# Patient Record
Sex: Female | Born: 1950
Health system: Southern US, Community
[De-identification: ages and names within clinical notes are randomized; demographics above are authoritative.]

## PROBLEM LIST (undated history)

## (undated) DIAGNOSIS — I10 Essential (primary) hypertension: Secondary | ICD-10-CM

## (undated) DIAGNOSIS — E079 Disorder of thyroid, unspecified: Secondary | ICD-10-CM

## (undated) HISTORY — PX: CARDIAC SURGERY: SHX584

---

## 2016-10-21 DIAGNOSIS — Z951 Presence of aortocoronary bypass graft: Secondary | ICD-10-CM

## 2018-11-27 DIAGNOSIS — I25810 Atherosclerosis of coronary artery bypass graft(s) without angina pectoris: Secondary | ICD-10-CM | POA: Insufficient documentation

## 2019-11-23 ENCOUNTER — Other Ambulatory Visit: Payer: Self-pay

## 2019-11-23 ENCOUNTER — Encounter: Payer: Self-pay | Admitting: Emergency Medicine

## 2019-11-23 ENCOUNTER — Emergency Department
Admission: EM | Admit: 2019-11-23 | Discharge: 2019-11-23 | Disposition: A | Discharge status: Home / Self Care | Payer: Medicare HMO | Attending: Student | Admitting: Student

## 2019-11-23 ENCOUNTER — Emergency Department: Payer: Medicare HMO

## 2019-11-23 DIAGNOSIS — I10 Essential (primary) hypertension: Secondary | ICD-10-CM | POA: Diagnosis not present

## 2019-11-23 DIAGNOSIS — W01198A Fall on same level from slipping, tripping and stumbling with subsequent striking against other object, initial encounter: Secondary | ICD-10-CM | POA: Insufficient documentation

## 2019-11-23 DIAGNOSIS — Y939 Activity, unspecified: Secondary | ICD-10-CM | POA: Insufficient documentation

## 2019-11-23 DIAGNOSIS — Z7982 Long term (current) use of aspirin: Secondary | ICD-10-CM | POA: Insufficient documentation

## 2019-11-23 DIAGNOSIS — Z87891 Personal history of nicotine dependence: Secondary | ICD-10-CM | POA: Diagnosis not present

## 2019-11-23 DIAGNOSIS — Z79899 Other long term (current) drug therapy: Secondary | ICD-10-CM | POA: Diagnosis not present

## 2019-11-23 DIAGNOSIS — S22080A Wedge compression fracture of T11-T12 vertebra, initial encounter for closed fracture: Secondary | ICD-10-CM | POA: Diagnosis not present

## 2019-11-23 DIAGNOSIS — Y999 Unspecified external cause status: Secondary | ICD-10-CM | POA: Insufficient documentation

## 2019-11-23 DIAGNOSIS — Y92008 Other place in unspecified non-institutional (private) residence as the place of occurrence of the external cause: Secondary | ICD-10-CM | POA: Insufficient documentation

## 2019-11-23 DIAGNOSIS — S299XXA Unspecified injury of thorax, initial encounter: Secondary | ICD-10-CM | POA: Diagnosis present

## 2019-11-23 DIAGNOSIS — W19XXXA Unspecified fall, initial encounter: Secondary | ICD-10-CM

## 2019-11-23 HISTORY — DX: Essential (primary) hypertension: I10

## 2019-11-23 HISTORY — DX: Disorder of thyroid, unspecified: E07.9

## 2019-11-23 MED ORDER — OXYCODONE-ACETAMINOPHEN 7.5-325 MG PO TABS: 1.0000 | ORAL_TABLET | Freq: Four times a day (QID) | ORAL | 0 refills | Status: AC | PRN

## 2019-11-23 MED ORDER — OXYCODONE-ACETAMINOPHEN 7.5-325 MG PO TABS
1.0000 | ORAL_TABLET | Freq: Once | ORAL | Status: AC
  Administered 2019-11-23: 1 via ORAL
  Filled 2019-11-23: qty 1

## 2019-11-23 NOTE — ED Notes (Signed)
See triage note  Presents s/p fall  States she slipped on deck   Hit her back on rail  Having pain to mid to lower back

## 2019-11-23 NOTE — ED Provider Notes (Signed)
[facility] Emergency Department Provider Note  ____________________________________________   None    (approximate)  I have reviewed the triage vital signs and the nursing notes.   HISTORY  Chief Complaint Fall and Back Pain   HPI Desiree Madden is a 68 y.o. female presents to the ED via EMS after falling this morning on her deck.  Patient states that she landed initially on her buttocks and then hit her back on the rail.  She denies any head injury or loss of consciousness.  Patient currently complains of mid back pain.  She was able to get back in the house and was sitting on the bed when EMS arrived.  She denies any other symptoms or injuries.  Currently she rates her pain as 10/10.       Past Medical History:  Diagnosis Date  . Hypertension   . Thyroid disease     There are no problems to display for this patient.   Past Surgical History:  Procedure Laterality Date  . CARDIAC SURGERY      Prior to Admission medications   Medication Sig Start Date End Date Taking? Authorizing Provider  aspirin EC 81 MG tablet Take 81 mg by mouth daily.   Yes [provider]  atorvastatin (LIPITOR) 20 MG tablet Take 20 mg by mouth daily.   Yes [provider]  fluticasone (FLONASE) 50 MCG/ACT nasal spray Place 2 sprays into both nostrils daily.   Yes [provider]  levothyroxine (SYNTHROID) 88 MCG tablet Take 88 mcg by mouth daily before breakfast.   Yes [provider]  metoprolol tartrate (LOPRESSOR) 25 MG tablet Take 25 mg by mouth 2 (two) times daily.   Yes [provider]  oxyCODONE-acetaminophen (PERCOCET) 7.5-325 MG tablet Take 1 tablet by mouth every 6 (six) hours as needed for severe pain. 11/23/19 11/22/20  Johnn Hai, PA-C    Allergies Baclofen, Sertraline, Sulfa antibiotics, and Ibuprofen  No family history on file.  Social History Social History   Tobacco Use  . Smoking status: Former  Research scientist (life sciences)  . Smokeless tobacco: Never Used  Substance Use Topics  . Alcohol use: Not on file  . Drug use: Not on file    Review of Systems Constitutional: No fever/chills Eyes: No visual changes. ENT: No injury. Cardiovascular: Denies chest pain. Respiratory: Denies shortness of breath. Gastrointestinal: No abdominal pain.  No nausea, no vomiting. Musculoskeletal: Low back pain and bilateral buttocks pain. Skin: Negative for rash. Neurological: Negative for headaches, focal weakness or numbness. ____________________________________________   PHYSICAL EXAM:  VITAL SIGNS: ED Triage Vitals  Enc Vitals Group     BP 11/23/19 0644 (!) 158/81     Pulse Rate 11/23/19 0644 78     Resp 11/23/19 0644 18     Temp 11/23/19 0644 98.2 F (36.8 C)     Temp Source 11/23/19 0644 Oral     SpO2 11/23/19 0644 99 %     Weight 11/23/19 0644 180 lb (81.6 kg)     Height 11/23/19 0644 $RemoveBefor'5\' 4"'nPyBjgXskMZO$  (1.626 m)     Head Circumference --      Peak Flow --      Pain Score 11/23/19 0649 10     Pain Loc --      Pain Edu? --      Excl. in [place]? --    Constitutional: Alert and oriented. Well appearing and in no acute distress. Eyes: Conjunctivae are normal.  Head: Atraumatic. Neck: No stridor.  No cervical tenderness on palpation posteriorly. Cardiovascular: Normal rate, regular rhythm. Grossly normal heart sounds.  Good peripheral circulation. Respiratory: Normal respiratory effort.  No retractions. Lungs CTAB.  Nontender ribs to palpation. Gastrointestinal: Soft and nontender. No distention. Musculoskeletal: No gross deformities noted on examination of the back.  Moderate tenderness on palpation of the lumbar and sacral area.  No ecchymosis or abrasions are seen.  Range of motion is guarding secondary to pain. Neurologic:  Normal speech and language. No gross focal neurologic deficits are appreciated. No gait instability. Skin:  Skin is warm, dry and intact.  No ecchymosis or abrasions was noted. Psychiatric:  Mood and affect are normal. Speech and behavior are normal.  ____________________________________________   LABS (all labs ordered are listed, but only abnormal results are displayed)  Labs Reviewed - No data to display ____________________________________________ RADIOLOGY  Official radiology report(s): DG Lumbar Spine 2-3 Views  Result Date: 11/23/2019 CLINICAL DATA:  Back pain after fall EXAM: LUMBAR SPINE - 2-3 VIEW COMPARISON:  None. FINDINGS: Transitional lumbosacral anatomy with a partially sacralized L5 segment. Superior endplate compression deformity of the T11 vertebral body with approximately 25% vertebral body height loss. Remaining vertebral body heights are maintained. Intervertebral disc height loss at L4-5. Mild lower lumbar facet arthrosis. Advanced aortoiliac atherosclerotic calcifications. IMPRESSION: 1. Superior endplate compression deformity of the T11 vertebral body with approximately 25% vertebral body height loss. 2. Transitional lumbosacral anatomy with partially sacralized L5 segment. Electronically Signed   By: Davina Poke M.D.   On: 11/23/2019 08:04   CT THORACIC SPINE WO CONTRAST  Result Date: 11/23/2019 CLINICAL DATA:  T11 compression fracture secondary to a fall this morning. Severe back pain. EXAM: CT THORACIC SPINE WITHOUT CONTRAST TECHNIQUE: Multidetector CT images of the thoracic were obtained using the standard protocol without intravenous contrast. COMPARISON:  Radiographs dated 11/23/2019 FINDINGS: Alignment: Normal. Vertebrae: There is a mild compression fracture of the superior endplate of M41. The posterosuperior aspect of the T11 vertebral body extends 4 mm into the spinal canal with slight symmetric indentation upon the thecal sac. The fracture does not involve the pedicles or other posterior elements. Paraspinal and other soft tissues: Slight paraspinal soft tissue stranding at T10-11 consistent with minimal hemorrhage secondary to the T11  fracture. Aortic atherosclerosis. Disc levels: C7-T1 through T9-10: Normal. T10-11: No disc bulging or protrusion. Slight protrusion of the posterosuperior aspect of the T11 vertebral body into the spinal canal with symmetrical slight indentation upon the thecal sac but without focal neural impingement. T11-12: Normal. T12-L1: Normal. IMPRESSION: Mild compression fracture of the superior endplate of R83. The posterosuperior aspect of the T11 vertebral body extends 4 mm into the spinal canal with slight symmetric indentation upon the thecal sac but without focal neural impingement. Otherwise, normal thoracic spine. Electronically Signed   By: Lorriane Shire M.D.   On: 11/23/2019 09:25    ____________________________________________   PROCEDURES  Procedure(s) performed (including Critical Care):  Procedures   ____________________________________________   INITIAL IMPRESSION / ASSESSMENT AND PLAN / ED COURSE  As part of my medical decision making, I reviewed the following data within the electronic MEDICAL RECORD NUMBER Notes from prior ED visits and [place] Controlled Substance Database  68 year old female presents to the ED with complaint of back pain after sliding on her deck this morning hitting her back against the railing.  Patient denied any head injury or loss of consciousness.  Lumbar x-ray showed compression fracture T11 and CT skin verified 25% loss of height.  Patient was  made aware.  She is comfortable currently after having a Percocet 7.5 mg.  She is encouraged to use ice on her back as needed.  She will follow-up with orthopedics.  A prescription for Percocet was sent to her pharmacy.  She is aware that this medication could cause drowsiness as she has taken it in the past.  She is aware that she can follow-up with her PCP if any continued pain medication as needed.  ____________________________________________   FINAL CLINICAL IMPRESSION(S) / ED DIAGNOSES  Final diagnoses:  Compression  fracture of T11 vertebra, initial encounter ([place])  Fall in home, initial encounter     ED Discharge Orders         Ordered    oxyCODONE-acetaminophen (PERCOCET) 7.5-325 MG tablet  Every 6 hours PRN     11/23/19 0946           Note:  This document was prepared using Dragon voice recognition software and may include unintentional dictation errors.    Johnn Hai, PA-C 11/23/19 1509    Lilia Pro., MD 11/23/19 [phone]

## 2019-11-23 NOTE — ED Triage Notes (Addendum)
Pt arrived via [facility] with reports of fall this morning. PT states she slipped on icy deck, states she fell back into the wooden rail and hit the middle of her back and fell onto her tailbone.  Pt denies hitting her head, denies any neck pain.  Pt states pain is worse with movement.  Per EMS pt was sitting on the bed when they arrived at her house.  Per EMS pt had no palpable pain to back.

## 2019-11-23 NOTE — Discharge Instructions (Signed)
Follow-up with your primary care provider for any continued pain medication.  Dr. Roland Rack is the orthopedist on-call and you should make an appointment for follow-up concerning your compression fracture.  You may use ice or heat to your muscles as needed for discomfort.  Take pain medication only as directed every 6 hours.  Be aware that this medication could cause drowsiness and increase your risk for falling.  Return to the emergency department if any severe worsening of your symptoms.

## 2021-01-16 DIAGNOSIS — R300 Dysuria: Secondary | ICD-10-CM | POA: Diagnosis not present

## 2021-01-16 DIAGNOSIS — E039 Hypothyroidism, unspecified: Secondary | ICD-10-CM | POA: Diagnosis not present

## 2021-01-16 DIAGNOSIS — N321 Vesicointestinal fistula: Secondary | ICD-10-CM | POA: Diagnosis not present

## 2021-01-16 DIAGNOSIS — Z7689 Persons encountering health services in other specified circumstances: Secondary | ICD-10-CM | POA: Diagnosis not present

## 2021-01-16 DIAGNOSIS — Z780 Asymptomatic menopausal state: Secondary | ICD-10-CM | POA: Diagnosis not present

## 2021-01-16 DIAGNOSIS — E782 Mixed hyperlipidemia: Secondary | ICD-10-CM | POA: Diagnosis not present

## 2021-01-16 DIAGNOSIS — R7309 Other abnormal glucose: Secondary | ICD-10-CM | POA: Diagnosis not present

## 2021-01-16 DIAGNOSIS — I25810 Atherosclerosis of coronary artery bypass graft(s) without angina pectoris: Secondary | ICD-10-CM | POA: Diagnosis not present

## 2021-01-16 DIAGNOSIS — M8000XG Age-related osteoporosis with current pathological fracture, unspecified site, subsequent encounter for fracture with delayed healing: Secondary | ICD-10-CM | POA: Diagnosis not present

## 2021-01-16 DIAGNOSIS — I10 Essential (primary) hypertension: Secondary | ICD-10-CM | POA: Diagnosis not present

## 2021-02-07 DIAGNOSIS — I10 Essential (primary) hypertension: Secondary | ICD-10-CM | POA: Diagnosis not present

## 2021-02-07 DIAGNOSIS — E782 Mixed hyperlipidemia: Secondary | ICD-10-CM | POA: Diagnosis not present

## 2021-02-07 DIAGNOSIS — I25708 Atherosclerosis of coronary artery bypass graft(s), unspecified, with other forms of angina pectoris: Secondary | ICD-10-CM | POA: Diagnosis not present

## 2021-02-07 DIAGNOSIS — Z7689 Persons encountering health services in other specified circumstances: Secondary | ICD-10-CM | POA: Diagnosis not present

## 2021-02-21 ENCOUNTER — Encounter: Payer: Self-pay | Admitting: Radiology

## 2021-02-21 ENCOUNTER — Emergency Department: Payer: Medicare HMO

## 2021-02-21 ENCOUNTER — Inpatient Hospital Stay
Admission: EM | Admit: 2021-02-21 | Discharge: 2021-04-03 | DRG: 003 | Disposition: A | Discharge status: Rehabilitation Facility | Payer: Medicare HMO | Attending: Internal Medicine | Admitting: Internal Medicine

## 2021-02-21 ENCOUNTER — Other Ambulatory Visit: Payer: Self-pay

## 2021-02-21 DIAGNOSIS — Z20822 Contact with and (suspected) exposure to covid-19: Secondary | ICD-10-CM | POA: Diagnosis not present

## 2021-02-21 DIAGNOSIS — Z01818 Encounter for other preprocedural examination: Secondary | ICD-10-CM | POA: Diagnosis not present

## 2021-02-21 DIAGNOSIS — E860 Dehydration: Secondary | ICD-10-CM | POA: Diagnosis not present

## 2021-02-21 DIAGNOSIS — E785 Hyperlipidemia, unspecified: Secondary | ICD-10-CM | POA: Diagnosis present

## 2021-02-21 DIAGNOSIS — Z9889 Other specified postprocedural states: Secondary | ICD-10-CM

## 2021-02-21 DIAGNOSIS — K5641 Fecal impaction: Secondary | ICD-10-CM | POA: Diagnosis not present

## 2021-02-21 DIAGNOSIS — K089 Disorder of teeth and supporting structures, unspecified: Secondary | ICD-10-CM | POA: Diagnosis not present

## 2021-02-21 DIAGNOSIS — I214 Non-ST elevation (NSTEMI) myocardial infarction: Secondary | ICD-10-CM | POA: Diagnosis not present

## 2021-02-21 DIAGNOSIS — Z87891 Personal history of nicotine dependence: Secondary | ICD-10-CM

## 2021-02-21 DIAGNOSIS — Z809 Family history of malignant neoplasm, unspecified: Secondary | ICD-10-CM

## 2021-02-21 DIAGNOSIS — Z48816 Encounter for surgical aftercare following surgery on the genitourinary system: Secondary | ICD-10-CM | POA: Diagnosis not present

## 2021-02-21 DIAGNOSIS — R1312 Dysphagia, oropharyngeal phase: Secondary | ICD-10-CM | POA: Diagnosis not present

## 2021-02-21 DIAGNOSIS — T40605A Adverse effect of unspecified narcotics, initial encounter: Secondary | ICD-10-CM | POA: Diagnosis present

## 2021-02-21 DIAGNOSIS — K219 Gastro-esophageal reflux disease without esophagitis: Secondary | ICD-10-CM | POA: Diagnosis not present

## 2021-02-21 DIAGNOSIS — I255 Ischemic cardiomyopathy: Secondary | ICD-10-CM | POA: Diagnosis present

## 2021-02-21 DIAGNOSIS — I480 Paroxysmal atrial fibrillation: Secondary | ICD-10-CM | POA: Diagnosis not present

## 2021-02-21 DIAGNOSIS — R110 Nausea: Secondary | ICD-10-CM | POA: Diagnosis not present

## 2021-02-21 DIAGNOSIS — N1832 Chronic kidney disease, stage 3b: Secondary | ICD-10-CM | POA: Diagnosis present

## 2021-02-21 DIAGNOSIS — E876 Hypokalemia: Secondary | ICD-10-CM | POA: Diagnosis not present

## 2021-02-21 DIAGNOSIS — G928 Other toxic encephalopathy: Secondary | ICD-10-CM | POA: Diagnosis not present

## 2021-02-21 DIAGNOSIS — T17908A Unspecified foreign body in respiratory tract, part unspecified causing other injury, initial encounter: Secondary | ICD-10-CM

## 2021-02-21 DIAGNOSIS — I491 Atrial premature depolarization: Secondary | ICD-10-CM | POA: Diagnosis not present

## 2021-02-21 DIAGNOSIS — E870 Hyperosmolality and hypernatremia: Secondary | ICD-10-CM | POA: Diagnosis not present

## 2021-02-21 DIAGNOSIS — Z818 Family history of other mental and behavioral disorders: Secondary | ICD-10-CM

## 2021-02-21 DIAGNOSIS — R918 Other nonspecific abnormal finding of lung field: Secondary | ICD-10-CM | POA: Diagnosis not present

## 2021-02-21 DIAGNOSIS — K659 Peritonitis, unspecified: Secondary | ICD-10-CM | POA: Diagnosis not present

## 2021-02-21 DIAGNOSIS — D62 Acute posthemorrhagic anemia: Secondary | ICD-10-CM | POA: Diagnosis not present

## 2021-02-21 DIAGNOSIS — E872 Acidosis: Secondary | ICD-10-CM | POA: Diagnosis present

## 2021-02-21 DIAGNOSIS — Z430 Encounter for attention to tracheostomy: Secondary | ICD-10-CM | POA: Diagnosis not present

## 2021-02-21 DIAGNOSIS — R0902 Hypoxemia: Secondary | ICD-10-CM

## 2021-02-21 DIAGNOSIS — N390 Urinary tract infection, site not specified: Secondary | ICD-10-CM | POA: Diagnosis not present

## 2021-02-21 DIAGNOSIS — K651 Peritoneal abscess: Secondary | ICD-10-CM

## 2021-02-21 DIAGNOSIS — R1310 Dysphagia, unspecified: Secondary | ICD-10-CM | POA: Diagnosis not present

## 2021-02-21 DIAGNOSIS — K5780 Diverticulitis of intestine, part unspecified, with perforation and abscess without bleeding: Secondary | ICD-10-CM | POA: Diagnosis not present

## 2021-02-21 DIAGNOSIS — R578 Other shock: Secondary | ICD-10-CM | POA: Diagnosis present

## 2021-02-21 DIAGNOSIS — J9690 Respiratory failure, unspecified, unspecified whether with hypoxia or hypercapnia: Secondary | ICD-10-CM | POA: Diagnosis not present

## 2021-02-21 DIAGNOSIS — J811 Chronic pulmonary edema: Secondary | ICD-10-CM | POA: Diagnosis not present

## 2021-02-21 DIAGNOSIS — G7281 Critical illness myopathy: Secondary | ICD-10-CM | POA: Diagnosis not present

## 2021-02-21 DIAGNOSIS — N179 Acute kidney failure, unspecified: Secondary | ICD-10-CM | POA: Diagnosis present

## 2021-02-21 DIAGNOSIS — Z8249 Family history of ischemic heart disease and other diseases of the circulatory system: Secondary | ICD-10-CM

## 2021-02-21 DIAGNOSIS — N1831 Chronic kidney disease, stage 3a: Secondary | ICD-10-CM

## 2021-02-21 DIAGNOSIS — B9689 Other specified bacterial agents as the cause of diseases classified elsewhere: Secondary | ICD-10-CM | POA: Diagnosis not present

## 2021-02-21 DIAGNOSIS — Z951 Presence of aortocoronary bypass graft: Secondary | ICD-10-CM | POA: Diagnosis not present

## 2021-02-21 DIAGNOSIS — N183 Chronic kidney disease, stage 3 unspecified: Secondary | ICD-10-CM | POA: Diagnosis not present

## 2021-02-21 DIAGNOSIS — E039 Hypothyroidism, unspecified: Secondary | ICD-10-CM | POA: Diagnosis present

## 2021-02-21 DIAGNOSIS — B952 Enterococcus as the cause of diseases classified elsewhere: Secondary | ICD-10-CM | POA: Diagnosis not present

## 2021-02-21 DIAGNOSIS — M6289 Other specified disorders of muscle: Secondary | ICD-10-CM | POA: Diagnosis present

## 2021-02-21 DIAGNOSIS — D649 Anemia, unspecified: Secondary | ICD-10-CM | POA: Diagnosis not present

## 2021-02-21 DIAGNOSIS — I5043 Acute on chronic combined systolic (congestive) and diastolic (congestive) heart failure: Secondary | ICD-10-CM | POA: Diagnosis not present

## 2021-02-21 DIAGNOSIS — R109 Unspecified abdominal pain: Secondary | ICD-10-CM

## 2021-02-21 DIAGNOSIS — J9621 Acute and chronic respiratory failure with hypoxia: Secondary | ICD-10-CM | POA: Diagnosis not present

## 2021-02-21 DIAGNOSIS — K9403 Colostomy malfunction: Secondary | ICD-10-CM | POA: Diagnosis not present

## 2021-02-21 DIAGNOSIS — Z8261 Family history of arthritis: Secondary | ICD-10-CM

## 2021-02-21 DIAGNOSIS — I472 Ventricular tachycardia: Secondary | ICD-10-CM | POA: Diagnosis not present

## 2021-02-21 DIAGNOSIS — R509 Fever, unspecified: Secondary | ICD-10-CM | POA: Diagnosis not present

## 2021-02-21 DIAGNOSIS — K5732 Diverticulitis of large intestine without perforation or abscess without bleeding: Secondary | ICD-10-CM | POA: Diagnosis not present

## 2021-02-21 DIAGNOSIS — I471 Supraventricular tachycardia: Secondary | ICD-10-CM | POA: Diagnosis not present

## 2021-02-21 DIAGNOSIS — I10 Essential (primary) hypertension: Secondary | ICD-10-CM | POA: Diagnosis present

## 2021-02-21 DIAGNOSIS — R1111 Vomiting without nausea: Secondary | ICD-10-CM | POA: Diagnosis not present

## 2021-02-21 DIAGNOSIS — J9811 Atelectasis: Secondary | ICD-10-CM | POA: Diagnosis not present

## 2021-02-21 DIAGNOSIS — Z452 Encounter for adjustment and management of vascular access device: Secondary | ICD-10-CM | POA: Diagnosis not present

## 2021-02-21 DIAGNOSIS — R1084 Generalized abdominal pain: Secondary | ICD-10-CM | POA: Diagnosis not present

## 2021-02-21 DIAGNOSIS — K9409 Other complications of colostomy: Secondary | ICD-10-CM | POA: Diagnosis not present

## 2021-02-21 DIAGNOSIS — N736 Female pelvic peritoneal adhesions (postinfective): Secondary | ICD-10-CM | POA: Diagnosis present

## 2021-02-21 DIAGNOSIS — I129 Hypertensive chronic kidney disease with stage 1 through stage 4 chronic kidney disease, or unspecified chronic kidney disease: Secondary | ICD-10-CM | POA: Diagnosis not present

## 2021-02-21 DIAGNOSIS — K56609 Unspecified intestinal obstruction, unspecified as to partial versus complete obstruction: Secondary | ICD-10-CM

## 2021-02-21 DIAGNOSIS — K828 Other specified diseases of gallbladder: Secondary | ICD-10-CM | POA: Diagnosis not present

## 2021-02-21 DIAGNOSIS — R1032 Left lower quadrant pain: Secondary | ICD-10-CM | POA: Diagnosis not present

## 2021-02-21 DIAGNOSIS — I5021 Acute systolic (congestive) heart failure: Secondary | ICD-10-CM | POA: Diagnosis not present

## 2021-02-21 DIAGNOSIS — K55049 Acute infarction of large intestine, extent unspecified: Secondary | ICD-10-CM | POA: Diagnosis present

## 2021-02-21 DIAGNOSIS — F411 Generalized anxiety disorder: Secondary | ICD-10-CM | POA: Diagnosis not present

## 2021-02-21 DIAGNOSIS — I2510 Atherosclerotic heart disease of native coronary artery without angina pectoris: Secondary | ICD-10-CM | POA: Diagnosis present

## 2021-02-21 DIAGNOSIS — R9431 Abnormal electrocardiogram [ECG] [EKG]: Secondary | ICD-10-CM | POA: Diagnosis not present

## 2021-02-21 DIAGNOSIS — K55041 Focal (segmental) acute infarction of large intestine: Secondary | ICD-10-CM | POA: Diagnosis not present

## 2021-02-21 DIAGNOSIS — Z978 Presence of other specified devices: Secondary | ICD-10-CM | POA: Diagnosis not present

## 2021-02-21 DIAGNOSIS — K6389 Other specified diseases of intestine: Secondary | ICD-10-CM | POA: Diagnosis not present

## 2021-02-21 DIAGNOSIS — N322 Vesical fistula, not elsewhere classified: Secondary | ICD-10-CM | POA: Diagnosis not present

## 2021-02-21 DIAGNOSIS — Z4659 Encounter for fitting and adjustment of other gastrointestinal appliance and device: Secondary | ICD-10-CM | POA: Diagnosis not present

## 2021-02-21 DIAGNOSIS — B967 Clostridium perfringens [C. perfringens] as the cause of diseases classified elsewhere: Secondary | ICD-10-CM | POA: Diagnosis not present

## 2021-02-21 DIAGNOSIS — I96 Gangrene, not elsewhere classified: Secondary | ICD-10-CM | POA: Diagnosis present

## 2021-02-21 DIAGNOSIS — R6521 Severe sepsis with septic shock: Secondary | ICD-10-CM | POA: Diagnosis not present

## 2021-02-21 DIAGNOSIS — Z9049 Acquired absence of other specified parts of digestive tract: Secondary | ICD-10-CM

## 2021-02-21 DIAGNOSIS — K0889 Other specified disorders of teeth and supporting structures: Secondary | ICD-10-CM | POA: Diagnosis not present

## 2021-02-21 DIAGNOSIS — J9601 Acute respiratory failure with hypoxia: Secondary | ICD-10-CM | POA: Diagnosis not present

## 2021-02-21 DIAGNOSIS — K5903 Drug induced constipation: Secondary | ICD-10-CM | POA: Diagnosis present

## 2021-02-21 DIAGNOSIS — Z79890 Hormone replacement therapy (postmenopausal): Secondary | ICD-10-CM

## 2021-02-21 DIAGNOSIS — Z87440 Personal history of urinary (tract) infections: Secondary | ICD-10-CM

## 2021-02-21 DIAGNOSIS — K51913 Ulcerative colitis, unspecified with fistula: Secondary | ICD-10-CM | POA: Diagnosis not present

## 2021-02-21 DIAGNOSIS — N1330 Unspecified hydronephrosis: Secondary | ICD-10-CM | POA: Diagnosis not present

## 2021-02-21 DIAGNOSIS — N189 Chronic kidney disease, unspecified: Secondary | ICD-10-CM | POA: Diagnosis not present

## 2021-02-21 DIAGNOSIS — K5730 Diverticulosis of large intestine without perforation or abscess without bleeding: Secondary | ICD-10-CM | POA: Diagnosis not present

## 2021-02-21 DIAGNOSIS — J9600 Acute respiratory failure, unspecified whether with hypoxia or hypercapnia: Secondary | ICD-10-CM

## 2021-02-21 DIAGNOSIS — S22080A Wedge compression fracture of T11-T12 vertebra, initial encounter for closed fracture: Secondary | ICD-10-CM | POA: Diagnosis not present

## 2021-02-21 DIAGNOSIS — Z930 Tracheostomy status: Secondary | ICD-10-CM | POA: Diagnosis not present

## 2021-02-21 DIAGNOSIS — K9420 Gastrostomy complication, unspecified: Secondary | ICD-10-CM

## 2021-02-21 DIAGNOSIS — D72829 Elevated white blood cell count, unspecified: Secondary | ICD-10-CM | POA: Diagnosis not present

## 2021-02-21 DIAGNOSIS — K632 Fistula of intestine: Secondary | ICD-10-CM | POA: Diagnosis not present

## 2021-02-21 DIAGNOSIS — K5190 Ulcerative colitis, unspecified, without complications: Secondary | ICD-10-CM | POA: Diagnosis not present

## 2021-02-21 DIAGNOSIS — Z66 Do not resuscitate: Secondary | ICD-10-CM | POA: Diagnosis not present

## 2021-02-21 DIAGNOSIS — J449 Chronic obstructive pulmonary disease, unspecified: Secondary | ICD-10-CM | POA: Diagnosis not present

## 2021-02-21 DIAGNOSIS — F32A Depression, unspecified: Secondary | ICD-10-CM | POA: Diagnosis present

## 2021-02-21 DIAGNOSIS — I228 Subsequent ST elevation (STEMI) myocardial infarction of other sites: Secondary | ICD-10-CM | POA: Diagnosis not present

## 2021-02-21 DIAGNOSIS — R339 Retention of urine, unspecified: Secondary | ICD-10-CM | POA: Diagnosis not present

## 2021-02-21 DIAGNOSIS — K5792 Diverticulitis of intestine, part unspecified, without perforation or abscess without bleeding: Secondary | ICD-10-CM | POA: Diagnosis not present

## 2021-02-21 DIAGNOSIS — R7989 Other specified abnormal findings of blood chemistry: Secondary | ICD-10-CM | POA: Diagnosis present

## 2021-02-21 DIAGNOSIS — K5720 Diverticulitis of large intestine with perforation and abscess without bleeding: Secondary | ICD-10-CM | POA: Diagnosis not present

## 2021-02-21 DIAGNOSIS — I509 Heart failure, unspecified: Secondary | ICD-10-CM | POA: Diagnosis not present

## 2021-02-21 DIAGNOSIS — Z4682 Encounter for fitting and adjustment of non-vascular catheter: Secondary | ICD-10-CM | POA: Diagnosis not present

## 2021-02-21 DIAGNOSIS — R935 Abnormal findings on diagnostic imaging of other abdominal regions, including retroperitoneum: Secondary | ICD-10-CM | POA: Diagnosis not present

## 2021-02-21 DIAGNOSIS — R102 Pelvic and perineal pain: Secondary | ICD-10-CM | POA: Diagnosis not present

## 2021-02-21 DIAGNOSIS — N321 Vesicointestinal fistula: Secondary | ICD-10-CM | POA: Diagnosis not present

## 2021-02-21 DIAGNOSIS — K5900 Constipation, unspecified: Secondary | ICD-10-CM | POA: Diagnosis not present

## 2021-02-21 DIAGNOSIS — F410 Panic disorder [episodic paroxysmal anxiety] without agoraphobia: Secondary | ICD-10-CM | POA: Diagnosis present

## 2021-02-21 DIAGNOSIS — E43 Unspecified severe protein-calorie malnutrition: Secondary | ICD-10-CM | POA: Diagnosis not present

## 2021-02-21 DIAGNOSIS — I130 Hypertensive heart and chronic kidney disease with heart failure and stage 1 through stage 4 chronic kidney disease, or unspecified chronic kidney disease: Secondary | ICD-10-CM | POA: Diagnosis present

## 2021-02-21 DIAGNOSIS — R5381 Other malaise: Secondary | ICD-10-CM | POA: Diagnosis not present

## 2021-02-21 DIAGNOSIS — Z8349 Family history of other endocrine, nutritional and metabolic diseases: Secondary | ICD-10-CM

## 2021-02-21 DIAGNOSIS — R1010 Upper abdominal pain, unspecified: Secondary | ICD-10-CM | POA: Diagnosis not present

## 2021-02-21 DIAGNOSIS — Z0189 Encounter for other specified special examinations: Secondary | ICD-10-CM

## 2021-02-21 DIAGNOSIS — K658 Other peritonitis: Secondary | ICD-10-CM | POA: Diagnosis present

## 2021-02-21 DIAGNOSIS — R1030 Lower abdominal pain, unspecified: Secondary | ICD-10-CM | POA: Diagnosis not present

## 2021-02-21 DIAGNOSIS — I5042 Chronic combined systolic (congestive) and diastolic (congestive) heart failure: Secondary | ICD-10-CM | POA: Diagnosis not present

## 2021-02-21 DIAGNOSIS — A419 Sepsis, unspecified organism: Secondary | ICD-10-CM | POA: Diagnosis not present

## 2021-02-21 DIAGNOSIS — Z833 Family history of diabetes mellitus: Secondary | ICD-10-CM

## 2021-02-21 DIAGNOSIS — L89329 Pressure ulcer of left buttock, unspecified stage: Secondary | ICD-10-CM | POA: Diagnosis not present

## 2021-02-21 DIAGNOSIS — M21372 Foot drop, left foot: Secondary | ICD-10-CM | POA: Diagnosis not present

## 2021-02-21 DIAGNOSIS — Z79899 Other long term (current) drug therapy: Secondary | ICD-10-CM

## 2021-02-21 DIAGNOSIS — Z7982 Long term (current) use of aspirin: Secondary | ICD-10-CM

## 2021-02-21 DIAGNOSIS — J90 Pleural effusion, not elsewhere classified: Secondary | ICD-10-CM | POA: Diagnosis not present

## 2021-02-21 DIAGNOSIS — L8991 Pressure ulcer of unspecified site, stage 1: Secondary | ICD-10-CM | POA: Diagnosis not present

## 2021-02-21 LAB — URINALYSIS, COMPLETE (UACMP) WITH MICROSCOPIC
Bilirubin Urine: NEGATIVE
Glucose, UA: NEGATIVE mg/dL
Ketones, ur: NEGATIVE mg/dL
Nitrite: POSITIVE — AB
Protein, ur: 100 mg/dL — AB
Specific Gravity, Urine: 1.02 (ref 1.005–1.030)
pH: 7 (ref 5.0–8.0)

## 2021-02-21 LAB — CBC
HCT: 33.1 % — ABNORMAL LOW (ref 36.0–46.0)
Hemoglobin: 11 g/dL — ABNORMAL LOW (ref 12.0–15.0)
MCH: 29.6 pg (ref 26.0–34.0)
MCHC: 33.2 g/dL (ref 30.0–36.0)
MCV: 89.2 fL (ref 80.0–100.0)
Platelets: 267 10*3/uL (ref 150–400)
RBC: 3.71 MIL/uL — ABNORMAL LOW (ref 3.87–5.11)
RDW: 13.2 % (ref 11.5–15.5)
WBC: 17.9 10*3/uL — ABNORMAL HIGH (ref 4.0–10.5)
nRBC: 0 % (ref 0.0–0.2)

## 2021-02-21 LAB — LIPASE, BLOOD: Lipase: 54 U/L — ABNORMAL HIGH (ref 11–51)

## 2021-02-21 LAB — COMPREHENSIVE METABOLIC PANEL
ALT: 24 U/L (ref 0–44)
AST: 38 U/L (ref 15–41)
Albumin: 3.9 g/dL (ref 3.5–5.0)
Alkaline Phosphatase: 79 U/L (ref 38–126)
Anion gap: 10 (ref 5–15)
BUN: 34 mg/dL — ABNORMAL HIGH (ref 8–23)
CO2: 20 mmol/L — ABNORMAL LOW (ref 22–32)
Calcium: 9 mg/dL (ref 8.9–10.3)
Chloride: 106 mmol/L (ref 98–111)
Creatinine, Ser: 1.4 mg/dL — ABNORMAL HIGH (ref 0.44–1.00)
GFR, Estimated: 40 mL/min — ABNORMAL LOW (ref >60)
Glucose, Bld: 158 mg/dL — ABNORMAL HIGH (ref 70–99)
Potassium: 3.7 mmol/L (ref 3.5–5.1)
Sodium: 136 mmol/L (ref 135–145)
Total Bilirubin: 0.9 mg/dL (ref 0.3–1.2)
Total Protein: 8 g/dL (ref 6.5–8.1)

## 2021-02-21 LAB — LACTIC ACID, PLASMA
Lactic Acid, Venous: 3 mmol/L (ref 0.5–1.9)
Lactic Acid, Venous: 2.8 mmol/L (ref 0.5–1.9)

## 2021-02-21 LAB — RESP PANEL BY RT-PCR (FLU A&B, COVID) ARPGX2
Influenza A by PCR: NEGATIVE
Influenza B by PCR: NEGATIVE
SARS Coronavirus 2 by RT PCR: NEGATIVE

## 2021-02-21 LAB — TROPONIN I (HIGH SENSITIVITY): Troponin I (High Sensitivity): 17 ng/L (ref <18)

## 2021-02-21 MED ORDER — SODIUM CHLORIDE 0.9 % IV SOLN: INTRAVENOUS | Status: DC

## 2021-02-21 MED ORDER — ONDANSETRON HCL 4 MG/2ML IJ SOLN
4.0000 mg | Freq: Four times a day (QID) | INTRAMUSCULAR | Status: DC | PRN
Start: 2021-02-21 — End: 2021-03-02

## 2021-02-21 MED ORDER — HYDROMORPHONE HCL 1 MG/ML IJ SOLN
0.5000 mg | Freq: Once | INTRAMUSCULAR | Status: AC
  Administered 2021-02-21: 0.5 mg via INTRAVENOUS
  Filled 2021-02-21: qty 1

## 2021-02-21 MED ORDER — SODIUM CHLORIDE 0.9 % IV BOLUS
1000.0000 mL | Freq: Once | INTRAVENOUS | Status: AC
  Administered 2021-02-21: 1000 mL via INTRAVENOUS

## 2021-02-21 MED ORDER — FLUTICASONE PROPIONATE 50 MCG/ACT NA SUSP
2.0000 | Freq: Every day | NASAL | Status: DC
  Administered 2021-02-22 – 2021-03-01 (×7): 2 via NASAL
  Filled 2021-02-21: qty 16

## 2021-02-21 MED ORDER — CIPROFLOXACIN IN D5W 400 MG/200ML IV SOLN
400.0000 mg | Freq: Once | INTRAVENOUS | Status: AC
  Administered 2021-02-21: 400 mg via INTRAVENOUS
  Filled 2021-02-21: qty 200

## 2021-02-21 MED ORDER — METRONIDAZOLE IN NACL 5-0.79 MG/ML-% IV SOLN
500.0000 mg | Freq: Three times a day (TID) | INTRAVENOUS | Status: DC
  Administered 2021-02-22 – 2021-02-23 (×4): 500 mg via INTRAVENOUS
  Filled 2021-02-21 (×6): qty 100

## 2021-02-21 MED ORDER — ACETAMINOPHEN 325 MG PO TABS
650.0000 mg | ORAL_TABLET | Freq: Four times a day (QID) | ORAL | Status: DC | PRN
  Administered 2021-02-26 – 2021-02-27 (×2): 650 mg via ORAL
  Filled 2021-02-21 (×2): qty 2

## 2021-02-21 MED ORDER — FENTANYL CITRATE (PF) 100 MCG/2ML IJ SOLN
100.0000 ug | Freq: Once | INTRAMUSCULAR | Status: AC
  Administered 2021-02-21: 100 ug via INTRAVENOUS
  Filled 2021-02-21: qty 2

## 2021-02-21 MED ORDER — BISACODYL 10 MG RE SUPP
10.0000 mg | Freq: Once | RECTAL | Status: DC
  Filled 2021-02-21 (×2): qty 1

## 2021-02-21 MED ORDER — HYDROMORPHONE HCL 1 MG/ML IJ SOLN
0.5000 mg | INTRAMUSCULAR | Status: DC | PRN
  Administered 2021-02-22 (×3): 1 mg via INTRAVENOUS
  Filled 2021-02-21 (×3): qty 1

## 2021-02-21 MED ORDER — CIPROFLOXACIN IN D5W 400 MG/200ML IV SOLN
400.0000 mg | Freq: Two times a day (BID) | INTRAVENOUS | Status: DC
  Administered 2021-02-22 – 2021-02-23 (×3): 400 mg via INTRAVENOUS
  Filled 2021-02-21 (×4): qty 200

## 2021-02-21 MED ORDER — LEVOTHYROXINE SODIUM 88 MCG PO TABS
88.0000 ug | ORAL_TABLET | Freq: Every day | ORAL | Status: DC
  Administered 2021-02-23: 88 ug via ORAL
  Filled 2021-02-21 (×2): qty 1

## 2021-02-21 MED ORDER — ONDANSETRON HCL 4 MG/2ML IJ SOLN
4.0000 mg | Freq: Once | INTRAMUSCULAR | Status: AC
  Administered 2021-02-21: 4 mg via INTRAVENOUS
  Filled 2021-02-21: qty 2

## 2021-02-21 MED ORDER — ATORVASTATIN CALCIUM 20 MG PO TABS
20.0000 mg | ORAL_TABLET | Freq: Every day | ORAL | Status: DC
  Administered 2021-02-22 – 2021-02-28 (×7): 20 mg via ORAL
  Filled 2021-02-21 (×7): qty 1

## 2021-02-21 MED ORDER — HYDROCODONE-ACETAMINOPHEN 5-325 MG PO TABS
1.0000 | ORAL_TABLET | ORAL | Status: DC | PRN
Start: 2021-02-21 — End: 2021-02-25
  Administered 2021-02-21 – 2021-02-25 (×10): 2 via ORAL
  Filled 2021-02-21 (×10): qty 2

## 2021-02-21 MED ORDER — METRONIDAZOLE IN NACL 5-0.79 MG/ML-% IV SOLN
500.0000 mg | Freq: Once | INTRAVENOUS | Status: AC
  Administered 2021-02-21: 500 mg via INTRAVENOUS
  Filled 2021-02-21: qty 100

## 2021-02-21 MED ORDER — IOHEXOL 300 MG/ML  SOLN
75.0000 mL | Freq: Once | INTRAMUSCULAR | Status: AC | PRN
  Administered 2021-02-21: 75 mL via INTRAVENOUS

## 2021-02-21 MED ORDER — MORPHINE SULFATE (PF) 4 MG/ML IV SOLN
4.0000 mg | Freq: Once | INTRAVENOUS | Status: AC
  Administered 2021-02-21: 4 mg via INTRAVENOUS
  Filled 2021-02-21: qty 1

## 2021-02-21 MED ORDER — ONDANSETRON HCL 4 MG PO TABS: 4.0000 mg | ORAL_TABLET | Freq: Four times a day (QID) | ORAL | Status: DC | PRN

## 2021-02-21 MED ORDER — ACETAMINOPHEN 650 MG RE SUPP: 650.0000 mg | Freq: Four times a day (QID) | RECTAL | Status: DC | PRN

## 2021-02-21 NOTE — ED Notes (Signed)
Patient back from CT.

## 2021-02-21 NOTE — ED Notes (Signed)
Patient to CT at this time

## 2021-02-21 NOTE — ED Provider Notes (Signed)
[facility] Emergency Department Provider Note  Time seen: 4:21 PM  I have reviewed the triage vital signs and the nursing notes.   HISTORY  Chief Complaint Abdominal Pain   HPI Desiree Madden is a 70 y.o. female patient presents emergency department for lower abdominal pain.   Patient states since earlier today she has been experiencing moderate to severe lower abdominal pain.  Describes the pain as aching type pain.  States mild dysuria.  No known fever, no vomiting.  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  States she has seen debris in her urine.  Past Medical History:  Diagnosis Date  . Hypertension   . Thyroid disease     There are no problems to display for this patient.   Past Surgical History:  Procedure Laterality Date  . CARDIAC SURGERY      Prior to Admission medications   Medication Sig Start Date End Date Taking? Authorizing Provider  aspirin EC 81 MG tablet Take 81 mg by mouth daily.    [provider]  atorvastatin (LIPITOR) 20 MG tablet Take 20 mg by mouth daily.    [provider]  fluticasone (FLONASE) 50 MCG/ACT nasal spray Place 2 sprays into both nostrils daily.    [provider]  levothyroxine (SYNTHROID) 88 MCG tablet Take 88 mcg by mouth daily before breakfast.    [provider]  metoprolol tartrate (LOPRESSOR) 25 MG tablet Take 25 mg by mouth 2 (two) times daily.    [provider]    Allergies  Allergen Reactions  . Baclofen     Other reaction(s): Headache  . Sertraline     Other reaction(s): Other (See Comments), Other (See Comments) Muscle aches Muscle aches   . Sulfa Antibiotics     Other reaction(s): Other (See Comments), Other (See Comments) Pt was told not to take it due to colitis Pt was told not to take it due to colitis   . Ibuprofen Nausea And Vomiting    No family history on  file.  Social History Social History   Tobacco Use  . Smoking status: Former Research scientist (life sciences)  . Smokeless tobacco: Never Used  Vaping Use  . Vaping Use: Never used    Review of Systems Constitutional: Negative for fever. Cardiovascular: Negative for chest pain. Respiratory: Negative for shortness of breath. Gastrointestinal: Moderate to severe lower abdominal pain. Genitourinary: Mild dysuria.  Debris in her urine Musculoskeletal: Negative for musculoskeletal complaints Skin: Negative for skin complaints  Neurological: Negative for headache All other ROS negative  ____________________________________________   PHYSICAL EXAM:  VITAL SIGNS: ED Triage Vitals  Enc Vitals Group     BP 02/21/21 1535 (!) 128/101     Pulse Rate 02/21/21 1535 67     Resp 02/21/21 1535 18     Temp 02/21/21 1535 97.6 F (36.4 C)     Temp Source 02/21/21 1535 Oral     SpO2 02/21/21 1535 100 %     Weight 02/21/21 1540 125 lb (56.7 kg)     Height 02/21/21 1540 $RemoveBefor'5\' 4"'JiikNXmQYHRh$  (1.626 m)     Head Circumference --      Peak Flow --      Pain Score 02/21/21 1536 (P) 10     Pain Loc --      Pain Edu? --      Excl. in [place]? --     Constitutional: Alert and oriented. Well  appearing and in no distress. Eyes: Normal exam ENT      Head: Normocephalic and atraumatic.      Mouth/Throat: Mucous membranes are moist. Cardiovascular: Normal rate, regular rhythm.  Respiratory: Normal respiratory effort without tachypnea nor retractions. Breath sounds are clear Gastrointestinal: Soft, moderate lower abdominal tenderness palpation without rebound guarding or distention. Musculoskeletal: Nontender with normal range of motion in all extremities.  Neurologic:  Normal speech and language. No gross focal neurologic deficits  Skin:  Skin is warm, dry and intact.  Psychiatric: Mood and affect are normal.  ____________________________________________    EKG  EKG viewed and interpreted by myself shows a normal sinus rhythm at 62  bpm with a narrow QRS, normal axis, normal intervals.  Patient has inferolateral T wave inversions.  No old EKG for comparison.  Discussed this with the patient who states she has a known abnormal EKG and a planned echocardiogram coming up (unclear if this is the abnormality).  ____________________________________________    RADIOLOGY  IMPRESSION:  1. Advanced colonic diverticulosis with pericolonic fat stranding  about the proximal sigmoid colon, in the region of multiple colonic  diverticula, suspicious for acute diverticulitis. This fat stranding  extends to the adjacent urinary bladder.  2. Fluid level in the urinary bladder. This may be due to Foley  catheter/instrumentation, or fistula as provided history.  Pericolonic edema extends to the anterior aspect of the bladder.  3. Large volume of colonic stool from the splenic flexure distally,  suggesting constipation. There is wall thickening in the mid sigmoid  colon that is nonspecific. Recommend up-to-date colonoscopy to  exclude the possibility of colonic neoplasm.  4. Chronic but progressive moderately severe T11 compression  fracture. Increase vertebral body height loss since 11/23/2019  thoracic spine CT, particularly anteriorly.   ____________________________________________   INITIAL IMPRESSION / ASSESSMENT AND PLAN / ED COURSE  Pertinent labs & imaging results that were available during my care of the patient were reviewed by me and considered in my medical decision making (see chart for details).   Patient presents emergency department for lower abdominal pain since this morning.  Now moderate to severe in nature.  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  We will check labs, urinalysis, send urine culture.  We will obtain CT imaging of lower abdomen, treat pain nausea and IV hydrate.  Given the patient's abnormal EKG we will obtain a troponin as well.  Patient agreeable.  CT scan shows  diverticulitis with possible colovesicular fistula.  We will cover with antibiotics and also cover for urinary tract infection.  We will dose Cipro and Flagyl IV.  Given the patient's lower abdominal pain leukocytosis and CT findings we will admit to the hospital service for further work-up and treatment.  Ndea Kilroy was evaluated in Emergency Department on 02/21/2021 for the symptoms described in the history of present illness. She was evaluated in the context of the global COVID-19 pandemic, which necessitated consideration that the patient might be at risk for infection with the SARS-CoV-2 virus that causes COVID-19. Institutional protocols and algorithms that pertain to the evaluation of patients at risk for COVID-19 are in a state of rapid change based on information released by regulatory bodies including the CDC and federal and state organizations. These policies and algorithms were followed during the patient's care in the ED.  ____________________________________________   FINAL CLINICAL IMPRESSION(S) / ED DIAGNOSES  Abdominal pain Urinary tract infection Diverticulitis    Harvest Dark, MD 02/21/21  1822  

## 2021-02-21 NOTE — ED Triage Notes (Signed)
trigimeny pvc's 132mcg fentanyl and $RemoveBefo'4mg'jVtUfpukxaK$  zofran Started 3-4 hours ago. Pt reports lower abd pain throughout and states that she has a fistula in her bladder and a hx of diverticulitis Pt arrives via [facility]

## 2021-02-21 NOTE — H&P (Signed)
Desiree Madden CBU:[id number] DOB: 09-Feb-1951 DOA: 02/21/2021     PCP: [facility]   Outpatient Specialists:   CARDS:   Dr. Nehemiah Massed    Patient arrived to ER on 02/21/21 at 1529 Referred by Attending Harvest Dark, MD   Patient coming from: home      Chief Complaint: Chief Complaint  Patient presents with  . Abdominal Pain    HPI: Desiree Madden is a 70 y.o. female with medical history significant of colon-Bladder fistula and diverticulitis, hypertension hypothyroidism, HLD, ulcerative colitis    Presented with abdominal pain starting since today severe in the lower abdomen.  She has been having some dysuria no fevers no chills no vomiting.   patient reports she was supposed to have Operation to repair done in October but it got pushed back. She has appointment coming up on 1st of Feb.  Patient had prior episode of diverticulitis when she was in [place] reports felt very similar to current  Infectious risk factors:  Reports Diarrhea/abdominal pain,       Initial COVID TEST  NEGATIVE   Lab Results  Component Value Date   [place] NEGATIVE 02/21/2021     Regarding pertinent Chronic problems:   Hyperlipidemia -  on statins Lipitor   HTN on Lopressor      CAD sp CABG 2017 - On Aspirin, statin, betablocker                 -  followed by cardiology    Hypothyroidism: on synthroid     CKD stage III- baseline Cr   Estimated Creatinine Clearance: 32.3 mL/min (A) (by C-G formula based on SCr of 1.4 mg/dL (H)).  Lab Results  Component Value Date   CREATININE 1.40 (H) 02/21/2021   Ulcerative Colitis currently not on any therapy   While in ER: CT showing evidence of possible diverticulitis, constipation could not rule out colonic neoplasm Given a dose of Cipro Flagyl    ED Triage Vitals  Enc Vitals Group     BP 02/21/21 1535 (!) 128/101     Pulse Rate 02/21/21 1535 67     Resp 02/21/21 1535 18     Temp 02/21/21 1535 97.6 F (36.4 C)      Temp Source 02/21/21 1535 Oral     SpO2 02/21/21 1535 100 %     Weight 02/21/21 1540 125 lb (56.7 kg)     Height 02/21/21 1540 $RemoveBefor'5\' 4"'QOLIWLGIhKRw$  (1.626 m)     Head Circumference --      Peak Flow --      Pain Score 02/21/21 1536 (P) 10     Pain Loc --      Pain Edu? --      Excl. in [place]? --   TMAX(24)@     _________________________________________ Significant initial  Findings: Abnormal Labs Reviewed  LIPASE, BLOOD - Abnormal; Notable for the following components:      Result Value   Lipase 54 (*)    All other components within normal limits  COMPREHENSIVE METABOLIC PANEL - Abnormal; Notable for the following components:   CO2 20 (*)    Glucose, Bld 158 (*)    BUN 34 (*)    Creatinine, Ser 1.40 (*)    GFR, Estimated 40 (*)    All other components within normal limits  CBC - Abnormal; Notable for the following components:   WBC 17.9 (*)    RBC 3.71 (*)    Hemoglobin 11.0 (*)  HCT 33.1 (*)    All other components within normal limits  URINALYSIS, COMPLETE (UACMP) WITH MICROSCOPIC - Abnormal; Notable for the following components:   APPearance CLOUDY (*)    Hgb urine dipstick LARGE (*)    Protein, ur 100 (*)    Nitrite POSITIVE (*)    Leukocytes,Ua LARGE (*)    Bacteria, UA MANY (*)    All other components within normal limits   ____________________________________________ CTabd/pelvis - diverticultis  _________________________ Troponin 17 ECG: Ordered Personally reviewed by me showing: HR : 62 Rhythm: NSR,   nonspecific changes QTC 470   The recent clinical data is shown below. Vitals:   02/21/21 1600 02/21/21 1700 02/21/21 1800 02/21/21 1859  BP: (!) 157/73 (!) 156/70 (!) 148/75 (!) 164/88  Pulse: 66 65 67 66  Resp: $Remo'18 13 18 19  'oXwLP$ Temp:    98.9 F (37.2 C)  TempSrc:    Oral  SpO2: 99% 100% 99% 99%  Weight:      Height:          WBC     Component Value Date/Time   WBC 17.9 (H) 02/21/2021 1543      UA   evidence of UTI     Urine analysis:    Component  Value Date/Time   COLORURINE YELLOW 02/21/2021 1543   APPEARANCEUR CLOUDY (A) 02/21/2021 1543   LABSPEC 1.020 02/21/2021 1543   PHURINE 7.0 02/21/2021 1543   GLUCOSEU NEGATIVE 02/21/2021 1543   HGBUR LARGE (A) 02/21/2021 1543   BILIRUBINUR NEGATIVE 02/21/2021 1543   KETONESUR NEGATIVE 02/21/2021 1543   PROTEINUR 100 (A) 02/21/2021 1543   NITRITE POSITIVE (A) 02/21/2021 1543   LEUKOCYTESUR LARGE (A) 02/21/2021 1543      Lactic Acid, Venous    Component Value Date/Time   LATICACIDVEN 3.0 ([place]) 02/21/2021 1938     _____________ _______________________________________________ Hospitalist was called for admission for diverticulitis, Colono-vesicular fistula  The following Work up has been ordered so far:  Orders Placed This Encounter  Procedures  . Urine culture  . CT ABDOMEN PELVIS W CONTRAST  . Lipase, blood  . Comprehensive metabolic panel  . CBC  . Urinalysis, Complete w Microscopic  . Diet NPO time specified  . Consult to hospitalist  . ED EKG     Following Medications were ordered in ER: Medications  ciprofloxacin (CIPRO) IVPB 400 mg (400 mg Intravenous New Bag/Given 02/21/21 1829)  metroNIDAZOLE (FLAGYL) IVPB 500 mg (has no administration in time range)  morphine 4 MG/ML injection 4 mg (4 mg Intravenous Given 02/21/21 1614)  ondansetron (ZOFRAN) injection 4 mg (4 mg Intravenous Given 02/21/21 1614)  sodium chloride 0.9 % bolus 1,000 mL (1,000 mLs Intravenous New Bag/Given 02/21/21 1617)  iohexol (OMNIPAQUE) 300 MG/ML solution 75 mL (75 mLs Intravenous Contrast Given 02/21/21 1627)  fentaNYL (SUBLIMAZE) injection 100 mcg (100 mcg Intravenous Given 02/21/21 1713)        Consult Orders  (From admission, onward)         Start     Ordered   02/21/21 1900  Consult to hospitalist  Once       Provider:  (Not yet assigned)  Question Answer Comment  Place call to: [facility]   Reason for Consult Admit   Diagnosis/Clinical Info for Consult: UTI/diverticulitis       02/21/21 1900             OTHER Significant initial  Findings:  labs showing:    Recent Labs  Lab 02/21/21 1543  NA  136  K 3.7  CO2 20*  GLUCOSE 158*  BUN 34*  CREATININE 1.40*  CALCIUM 9.0    Cr   stable,  Up from baseline 1.6 Lab Results  Component Value Date   CREATININE 1.40 (H) 02/21/2021    Recent Labs  Lab 02/21/21 1543  AST 38  ALT 24  ALKPHOS 79  BILITOT 0.9  PROT 8.0  ALBUMIN 3.9   Lab Results  Component Value Date   CALCIUM 9.0 02/21/2021      Plt: Lab Results  Component Value Date   PLT 267 02/21/2021       Recent Labs  Lab 02/21/21 1543  WBC 17.9*  HGB 11.0*  HCT 33.1*  MCV 89.2  PLT 267    HG/HCT  stable,       Component Value Date/Time   HGB 11.0 (L) 02/21/2021 1543   HCT 33.1 (L) 02/21/2021 1543   MCV 89.2 02/21/2021 1543      Recent Labs  Lab 02/21/21 1543  LIPASE 54*   No results for input(s): AMMONIA in the last 168 hours.   Cardiac Panel (last 3 results) No results for input(s): CKTOTAL, CKMB, TROPONINI, RELINDX in the last 72 hours.   BNP (last 3 results) No results for input(s): BNP in the last 8760 hours.    DM  labs:  HbA1C: No results for input(s): HGBA1C in the last 8760 hours.     CBG (last 3)  No results for input(s): GLUCAP in the last 72 hours.        Cultures: No results found for: SDES, [place], CULT, REPTSTATUS   Radiological Exams on Admission: CT ABDOMEN PELVIS W CONTRAST  Result Date: 02/21/2021 CLINICAL DATA:  Lower abdominal pain. History of diverticulitis. 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 EXAM: CT ABDOMEN AND PELVIS WITH CONTRAST TECHNIQUE: Multidetector CT imaging of the abdomen and pelvis was performed using the standard protocol following bolus administration of intravenous contrast. CONTRAST:  43mL OMNIPAQUE IOHEXOL 300 MG/ML  SOLN COMPARISON:  None. FINDINGS: Lower chest: No basilar consolidation or pleural effusion. Minimal posterior  pleural thickening on the left. Coronary artery calcifications. Epicardial leads in place. Hepatobiliary: Minimal focal fatty infiltration adjacent to the gallbladder fossa. No suspicious hepatic lesion. No calcified gallstone or pericholecystic fat stranding. No biliary dilatation. Pancreas: No ductal dilatation or inflammation. Spleen: Normal in size without focal abnormality. Adrenals/Urinary Tract: Normal adrenal glands. Lobulated bilateral renal contours. Slight atrophy of left kidney. No hydronephrosis. No perinephric edema. Tiny cyst in the upper left kidney. No visualized renal stone. Symmetric excretion on delayed phase imaging. The urinary bladder is displaced into the left pelvis. There is a in bladder or fluid level. No perivesicular fat stranding. No definite bladder wall thickening. Loss of fat plane between the superior aspect of the urinary bladder and a sigmoid colonic diverticulum. Stomach/Bowel: Pericolonic fat stranding about the proximal sigmoid colon, series 2, image 62, in the region of multiple colonic diverticula suspicious for diverticulitis. This fat stranding extends to the adjacent urinary bladder. There are numerable colonic diverticula from the splenic flexure distally. Large volume of colonic stool from the splenic flexure distally. Mild sigmoid colonic wall thickening which is nonspecific. There are scattered right colonic diverticula. Transverse colon is tortuous. Small hiatal hernia. Stomach is decompressed. No small bowel obstruction or inflammation. Normal air-filled appendix. Vascular/Lymphatic: Moderately advanced aortic atherosclerosis. Irregular plaque in the upper abdominal aorta. Incidental 2 right and left renal arteries. No aneurysm. Calcified plaque throughout the left  common iliac artery causes some degree of stenosis. Patent portal vein. No portal or mesenteric venous gas. Multiple small retroperitoneal lymph nodes, typically reactive. No bulky abdominopelvic  adenopathy. Reproductive: Uterus remains in situ, atrophic and deviated to the left. No adnexal mass. Other: No free air or focal fluid collection. Minimal free fluid in the left pelvis. No abdominal wall hernia. Musculoskeletal: Chronic but progressive moderately severe T11 compression fracture. Increase vertebral body height loss since 11/23/2019 thoracic spine CT, particularly anteriorly. Hemi transitional lumbosacral anatomy with enlarged right transverse process and pseudoarticulation with the sacrum. Bones are subjectively under mineralized. IMPRESSION: 1. Advanced colonic diverticulosis with pericolonic fat stranding about the proximal sigmoid colon, in the region of multiple colonic diverticula, suspicious for acute diverticulitis. This fat stranding extends to the adjacent urinary bladder. 2. Fluid level in the urinary bladder. This may be due to Foley catheter/instrumentation, or fistula as provided history. Pericolonic edema extends to the anterior aspect of the bladder. 3. Large volume of colonic stool from the splenic flexure distally, suggesting constipation. There is wall thickening in the mid sigmoid colon that is nonspecific. Recommend up-to-date colonoscopy to exclude the possibility of colonic neoplasm. 4. Chronic but progressive moderately severe T11 compression fracture. Increase vertebral body height loss since 11/23/2019 thoracic spine CT, particularly anteriorly. Aortic Atherosclerosis (ICD10-I70.0). Electronically Signed   By: Keith Rake M.D.   On: 02/21/2021 16:54   _______________________________________________________________________________________________________ Latest  Blood pressure (!) 164/88, pulse 66, temperature 98.9 F (37.2 C), temperature source Oral, resp. rate 19, height $RemoveBe'5\' 4"'LXNPsWIHy$  (1.626 m), weight 56.7 kg, SpO2 99 %.   Review of Systems:    Pertinent positives include: chills abdominal pain,   Constitutional:  No weight loss, night sweats, Fevers, , fatigue,  weight loss  HEENT:  No headaches, Difficulty swallowing,Tooth/dental problems,Sore throat,  No sneezing, itching, ear ache, nasal congestion, post nasal drip,  Cardio-vascular:  No chest pain, Orthopnea, PND, anasarca, dizziness, palpitations.no Bilateral lower extremity swelling  GI:  No heartburn, indigestion, nausea, vomiting, diarrhea, change in bowel habits, loss of appetite, melena, blood in stool, hematemesis Resp:  no shortness of breath at rest. No dyspnea on exertion, No excess mucus, no productive cough, No non-productive cough, No coughing up of blood.No change in color of mucus.No wheezing. Skin:  no rash or lesions. No jaundice GU:  no dysuria, change in color of urine, no urgency or frequency. No straining to urinate.  No flank pain.  Musculoskeletal:  No joint pain or no joint swelling. No decreased range of motion. No back pain.  Psych:  No change in mood or affect. No depression or anxiety. No memory loss.  Neuro: no localizing neurological complaints, no tingling, no weakness, no double vision, no gait abnormality, no slurred speech, no confusion  All systems reviewed and apart from [place] all are negative _______________________________________________________________________________________________ Past Medical History:   Past Medical History:  Diagnosis Date  . Hypertension   . Thyroid disease       Past Surgical History:  Procedure Laterality Date  . CARDIAC SURGERY      Social History:  Ambulatory   independently       reports that she has quit smoking. She has never used smokeless tobacco. She reports that she does not drink alcohol and does not use drugs.     Family History:   Family History  Problem Relation Age of Onset  . Arthritis Other   . CAD Mother      Family History  Problem Relation Age of  Onset  . Anxiety Mother  . Coronary Artery Disease (Blocked arteries around heart) Mother  . Diabetes type II Mother  . Thyroid disease  Mother  . Heart disease Mother  . Cancer Father  renal  . Coronary Artery Disease (Blocked arteries around heart) Brother  . Osteoarthritis Brother    ______________________________________________________________________________________________ Allergies: Allergies  Allergen Reactions  . Baclofen     Other reaction(s): Headache  . Sertraline     Other reaction(s): Other (See Comments), Other (See Comments) Muscle aches Muscle aches   . Sulfa Antibiotics     Other reaction(s): Other (See Comments), Other (See Comments) Pt was told not to take it due to colitis Pt was told not to take it due to colitis   . Ibuprofen Nausea And Vomiting     Prior to Admission medications   Medication Sig Start Date End Date Taking? Authorizing Provider  aspirin EC 81 MG tablet Take 81 mg by mouth daily.    [provider]  atorvastatin (LIPITOR) 20 MG tablet Take 20 mg by mouth daily.    [provider]  fluticasone (FLONASE) 50 MCG/ACT nasal spray Place 2 sprays into both nostrils daily.    [provider]  levothyroxine (SYNTHROID) 88 MCG tablet Take 88 mcg by mouth daily before breakfast.    [provider]  metoprolol tartrate (LOPRESSOR) 25 MG tablet Take 25 mg by mouth 2 (two) times daily.    [provider]    ___________________________________________________________________________________________________ Physical Exam: MPNTIR with BMI 02/21/2021 02/21/2021 02/21/2021  Height - - -  Weight - - -  BMI - - -  Systolic 443 154 008  Diastolic 88 75 70  Pulse 66 67 65     1. General:  in No  Acute distress   Chronically ill -appearing 2. Psychological: Alert and  Oriented 3. Head/ENT:    Dry Mucous Membranes                          Head Non traumatic, neck supple                           Poor Dentition 4. SKIN: decreased Skin turgor,  Skin clean Dry and intact no rash 5. Heart: Regular rate and rhythm no  Murmur, no Rub or  gallop 6. Lungs:   no wheezes or crackles   7. Abdomen: Soft,  tender, Non distended  8. Lower extremities: no clubbing, cyanosis, no  edema 9. Neurologically Grossly intact, moving all 4 extremities equally   10. MSK: Normal range of motion    Chart has been reviewed  ______________________________________________________________________________________________  Assessment/Plan   70 y.o. female with medical history significant of colon-Bladder fistula and diverticulitis, hypertension hypothyroidism, HLD, ulcerative colitis Admitted for diverticulitis, Colono-vesicular fistula  Present on Admission: . Diverticulitis -  Cipro/flagyl , bowel rest, NPO,   . Fistula, bladder - cause of dirty UA, will need to have definitive treatment at [facility]  Possible colonic mass? On CT - will need to follow up with Gi as an outpatient once diverticulitis improves  . Ulcerative colitis ([place]) - chronic  . Hyperlipidemia - continue Lipitor  . Essential hypertension - resume home meds when stable  . Hypothyroidism - - Check TSH continue home medications at current dose  Abnormal ECG - no CP, trop WNL repeat in AM  CAD - chronic stable,  resume statin and betablocker  Elevated Lactic acid -  will rehydrate and recheck CT showing no evidence of ischemia Other plan as per orders.  CKDstage IIIa -  -chronic avoid nephrotoxic medications such as NSAIDs, Vanco Zosyn combo,  avoid hypotension, continue to follow renal function   DVT prophylaxis:  SCD      Code Status:    Code Status: Not on file FULL CODE  as per patient   I had personally discussed CODE STATUS with patient     Family Communication:   Family not at  Bedside    Disposition Plan:       To home once workup is complete and patient is stable   Following barriers for discharge:                                                   Pain controlled with PO medications                               Afebrile, white count improving able  to transition to PO antibiotics                             Will need to be able to tolerate PO                                        Consults called: none   Admission status:  ED Disposition    ED Disposition Condition [place]: [facility] [[id number]]  Level of Care: Med-Surg [16]  Covid Evaluation: Asymptomatic Screening Protocol (No Symptoms)  Diagnosis: Diverticulitis [[id number]]  Admitting Physician: Toy Baker [[id number]]  Attending Physician: Toy Baker [[id number]]  Estimated length of stay: past midnight tomorrow  Certification:: I certify this patient will need inpatient services for at least 2 midnights         inpatient     I Expect 2 midnight stay secondary to severity of patient's current illness need for inpatient interventions justified by the following:   Severe lab/radiological/exam abnormalities including:    diverticultis and extensive comorbidities including:   CAD    That are currently affecting medical management.   I expect  patient to be hospitalized for 2 midnights requiring inpatient medical care.  Patient is at high risk for adverse outcome (such as loss of life or disability) if not treated.  Indication for inpatient stay as follows:    severe pain requiring acute inpatient management,  inability to maintain oral hydration    Need for operative/procedural  intervention    Need for IV antibiotics, IV fluids  IV pain medications,      Level of care    tele  For  24H    No results found for: [id number]   Precautions: admitted as  asymptomatic screening protocol   PPE: Used by the provider:   N95  eye Goggles,  Gloves     Chrles Selley 02/21/2021, 8:56 PM    [facility]     after 2 AM please page floor coverage PA If 7AM-7PM, please contact the day team taking care of the patient using [email or web address]   Patient was evaluated in the context  of the global COVID-19 pandemic,  which necessitated consideration that the patient might be at risk for infection with the SARS-CoV-2 virus that causes COVID-19. Institutional protocols and algorithms that pertain to the evaluation of patients at risk for COVID-19 are in a state of rapid change based on information released by regulatory bodies including the CDC and federal and state organizations. These policies and algorithms were followed during the patient's care.

## 2021-02-22 DIAGNOSIS — R7989 Other specified abnormal findings of blood chemistry: Secondary | ICD-10-CM | POA: Diagnosis not present

## 2021-02-22 DIAGNOSIS — K5792 Diverticulitis of intestine, part unspecified, without perforation or abscess without bleeding: Secondary | ICD-10-CM | POA: Diagnosis not present

## 2021-02-22 DIAGNOSIS — N322 Vesical fistula, not elsewhere classified: Secondary | ICD-10-CM | POA: Diagnosis not present

## 2021-02-22 DIAGNOSIS — N390 Urinary tract infection, site not specified: Secondary | ICD-10-CM | POA: Diagnosis not present

## 2021-02-22 LAB — CBC WITH DIFFERENTIAL/PLATELET
Abs Immature Granulocytes: 0.09 10*3/uL — ABNORMAL HIGH (ref 0.00–0.07)
Basophils Absolute: 0 10*3/uL (ref 0.0–0.1)
Basophils Relative: 0 %
Eosinophils Absolute: 0 10*3/uL (ref 0.0–0.5)
Eosinophils Relative: 0 %
HCT: 34.3 % — ABNORMAL LOW (ref 36.0–46.0)
Hemoglobin: 11.6 g/dL — ABNORMAL LOW (ref 12.0–15.0)
Immature Granulocytes: 1 %
Lymphocytes Relative: 3 %
Lymphs Abs: 0.5 10*3/uL — ABNORMAL LOW (ref 0.7–4.0)
MCH: 30.3 pg (ref 26.0–34.0)
MCHC: 33.8 g/dL (ref 30.0–36.0)
MCV: 89.6 fL (ref 80.0–100.0)
Monocytes Absolute: 1.3 10*3/uL — ABNORMAL HIGH (ref 0.1–1.0)
Monocytes Relative: 7 %
Neutro Abs: 16.7 10*3/uL — ABNORMAL HIGH (ref 1.7–7.7)
Neutrophils Relative %: 89 %
Platelets: 238 10*3/uL (ref 150–400)
RBC: 3.83 MIL/uL — ABNORMAL LOW (ref 3.87–5.11)
RDW: 13.5 % (ref 11.5–15.5)
WBC: 18.6 10*3/uL — ABNORMAL HIGH (ref 4.0–10.5)
nRBC: 0 % (ref 0.0–0.2)

## 2021-02-22 LAB — COMPREHENSIVE METABOLIC PANEL
ALT: 23 U/L (ref 0–44)
AST: 36 U/L (ref 15–41)
Albumin: 3.6 g/dL (ref 3.5–5.0)
Alkaline Phosphatase: 73 U/L (ref 38–126)
Anion gap: 9 (ref 5–15)
BUN: 28 mg/dL — ABNORMAL HIGH (ref 8–23)
CO2: 19 mmol/L — ABNORMAL LOW (ref 22–32)
Calcium: 8.3 mg/dL — ABNORMAL LOW (ref 8.9–10.3)
Chloride: 111 mmol/L (ref 98–111)
Creatinine, Ser: 1.16 mg/dL — ABNORMAL HIGH (ref 0.44–1.00)
GFR, Estimated: 51 mL/min — ABNORMAL LOW (ref >60)
Glucose, Bld: 154 mg/dL — ABNORMAL HIGH (ref 70–99)
Potassium: 4.4 mmol/L (ref 3.5–5.1)
Sodium: 139 mmol/L (ref 135–145)
Total Bilirubin: 1.1 mg/dL (ref 0.3–1.2)
Total Protein: 7.6 g/dL (ref 6.5–8.1)

## 2021-02-22 LAB — MAGNESIUM: Magnesium: 1.8 mg/dL (ref 1.7–2.4)

## 2021-02-22 LAB — PHOSPHORUS: Phosphorus: 3.5 mg/dL (ref 2.5–4.6)

## 2021-02-22 LAB — HIV ANTIBODY (ROUTINE TESTING W REFLEX): HIV Screen 4th Generation wRfx: NONREACTIVE

## 2021-02-22 LAB — TSH: TSH: 1.173 u[IU]/mL (ref 0.350–4.500)

## 2021-02-22 LAB — LACTIC ACID, PLASMA: Lactic Acid, Venous: 2.3 mmol/L (ref 0.5–1.9)

## 2021-02-22 MED ORDER — HYDROMORPHONE HCL 1 MG/ML IJ SOLN
1.0000 mg | INTRAMUSCULAR | Status: DC | PRN
  Administered 2021-02-22 – 2021-02-25 (×14): 1 mg via INTRAVENOUS
  Filled 2021-02-22 (×15): qty 1

## 2021-02-22 MED ORDER — SORBITOL 70 % SOLN
30.0000 mL | Status: AC
  Administered 2021-02-22: 30 mL via ORAL
  Filled 2021-02-22 (×2): qty 30

## 2021-02-22 NOTE — Plan of Care (Signed)

## 2021-02-22 NOTE — Progress Notes (Addendum)
PROGRESS NOTE    Desiree Madden   PQZ:[id number]  DOB: [birth year]  DOA: 02/21/2021 PCP: [place]   Brief Narrative:  Desiree Madden is a 70 year old female with ulcerative colitis, diverticulosis who has a colovesical fistula.  She also has a history of hypothyroidism and hyperlipidemia. She presented to the hospital with severe abdominal pain and burning micturition.  She also vomiting 3 times on the day of admission.  She has been on and off antibiotics over the past few months for recurrent urinary tract infections related to the fistula.  She states she was supposed to have some sort of surgery for the fistula this past fall, however, it did not occur for some reason.  She is now waiting to see a gastroenterologist at the [facility] clinic on April 1st.  CT scan in the ED reveals possible diverticulitis and also a large amount of stool in the left colon.  She was started on ciprofloxacin and Flagyl.   Subjective: Abdominal pain still present. Dysuria improving.     Assessment & Plan:   Principal Problem:   Diverticulitis, UTI, vomiting, leukocytosis - vomiting likely related to infection + stool burden - cont Cipro and Flagyl IV - f/u urine culture  Active Problems:   Elevated lactic acid level - due to above- improving  Constipation - if she has BMs, will start clear liquids    Fistula, colovesical in setting of Ulcerative colitis  - not on DMARDS or steroids - f/u GI eval    Hyperlipidemia - Lipitor    Essential hypertension - hold Lopressor- follow BP    Hypothyroidism - cont Synthroid    AKI - Cr 1.40 has improved to 1.16- cont IVF as it is likely pre-renal  Normocytic anemia - follow   Time spent in minutes: 35 DVT prophylaxis: SCDs Start: 02/21/21 2200  Code Status: Full code Family Communication:  Level of Care: Level of care: Progressive Cardiac Disposition Plan:  Status is: Inpatient  Remains inpatient appropriate because:IV  treatments appropriate due to intensity of illness or inability to take PO   Dispo: The patient is from: Home              Anticipated d/c is to: Home              Patient currently is not medically stable to d/c.   Difficult to place patient No      Consultants:   GI Procedures:    Antimicrobials:  Anti-infectives (From admission, onward)   Start     Dose/Rate Route Frequency Ordered Stop   02/22/21 0600  ciprofloxacin (CIPRO) IVPB 400 mg        400 mg 200 mL/hr over 60 Minutes Intravenous Every 12 hours 02/21/21 2028     02/22/21 0400  metroNIDAZOLE (FLAGYL) IVPB 500 mg        500 mg 100 mL/hr over 60 Minutes Intravenous Every 8 hours 02/21/21 2028     02/21/21 1830  ciprofloxacin (CIPRO) IVPB 400 mg        400 mg 200 mL/hr over 60 Minutes Intravenous  Once 02/21/21 1820 02/21/21 1939   02/21/21 1830  metroNIDAZOLE (FLAGYL) IVPB 500 mg        500 mg 100 mL/hr over 60 Minutes Intravenous  Once 02/21/21 1820 02/21/21 2110       Objective: Vitals:   02/22/21 0453 02/22/21 0736 02/22/21 1128 02/22/21 1624  BP: (!) 129/55 129/61 133/61 (!) 132/49  Pulse: 72 72 76 73  Resp:  $'20 18 18 18  'i$ Temp: 97.9 F (36.6 C) (!) 97.4 F (36.3 C) 97.8 F (36.6 C) 98.6 F (37 C)  TempSrc: Oral     SpO2: 98% 96% 98% 97%  Weight: 59 kg     Height:        Intake/Output Summary (Last 24 hours) at 02/22/2021 1656 Last data filed at 02/22/2021 0600 Gross per 24 hour  Intake 1903.42 ml  Output -  Net 1903.42 ml   Filed Weights   02/21/21 1540 02/21/21 2202 02/22/21 0453  Weight: 56.7 kg 59 kg 59 kg    Examination: General exam: Appears comfortable  HEENT: PERRLA, oral mucosa moist, no sclera icterus or thrush Respiratory system: Clear to auscultation. Respiratory effort normal. Cardiovascular system: S1 & S2 heard, RRR.   Gastrointestinal system: Abdomen soft,  Tender in LLQ, nondistended. Normal bowel sounds. Central nervous system: Alert and oriented. No focal neurological  deficits. Extremities: No cyanosis, clubbing or edema Skin: No rashes or ulcers Psychiatry:  Mood & affect appropriate.     Data Reviewed: I have personally reviewed following labs and imaging studies  CBC: Recent Labs  Lab 02/21/21 1543 02/22/21 0147  WBC 17.9* 18.6*  NEUTROABS  --  16.7*  HGB 11.0* 11.6*  HCT 33.1* 34.3*  MCV 89.2 89.6  PLT 267 161   Basic Metabolic Panel: Recent Labs  Lab 02/21/21 1543 02/22/21 0147  NA 136 139  K 3.7 4.4  CL 106 111  CO2 20* 19*  GLUCOSE 158* 154*  BUN 34* 28*  CREATININE 1.40* 1.16*  CALCIUM 9.0 8.3*  MG  --  1.8  PHOS  --  3.5   GFR: Estimated Creatinine Clearance: 39 mL/min (A) (by C-G formula based on SCr of 1.16 mg/dL (H)). Liver Function Tests: Recent Labs  Lab 02/21/21 1543 02/22/21 0147  AST 38 36  ALT 24 23  ALKPHOS 79 73  BILITOT 0.9 1.1  PROT 8.0 7.6  ALBUMIN 3.9 3.6   Recent Labs  Lab 02/21/21 1543  LIPASE 54*   No results for input(s): AMMONIA in the last 168 hours. Coagulation Profile: No results for input(s): INR, PROTIME in the last 168 hours. Cardiac Enzymes: No results for input(s): CKTOTAL, CKMB, CKMBINDEX, TROPONINI in the last 168 hours. BNP (last 3 results) No results for input(s): PROBNP in the last 8760 hours. HbA1C: No results for input(s): HGBA1C in the last 72 hours. CBG: No results for input(s): GLUCAP in the last 168 hours. Lipid Profile: No results for input(s): CHOL, HDL, LDLCALC, TRIG, CHOLHDL, LDLDIRECT in the last 72 hours. Thyroid Function Tests: Recent Labs    02/22/21 0147  TSH 1.173   Anemia Panel: No results for input(s): VITAMINB12, FOLATE, FERRITIN, TIBC, IRON, RETICCTPCT in the last 72 hours. Urine analysis:    Component Value Date/Time   COLORURINE YELLOW 02/21/2021 1543   APPEARANCEUR CLOUDY (A) 02/21/2021 1543   LABSPEC 1.020 02/21/2021 1543   PHURINE 7.0 02/21/2021 1543   GLUCOSEU NEGATIVE 02/21/2021 1543   HGBUR LARGE (A) 02/21/2021 1543    BILIRUBINUR NEGATIVE 02/21/2021 1543   KETONESUR NEGATIVE 02/21/2021 1543   PROTEINUR 100 (A) 02/21/2021 1543   NITRITE POSITIVE (A) 02/21/2021 1543   LEUKOCYTESUR LARGE (A) 02/21/2021 1543   Sepsis Labs: $RemoveBefo'@[id number]'[id number]$ (procalcitonin:4,lacticidven:4) ) Recent Results (from the past 240 hour(s))  Resp Panel by RT-PCR (Flu A&B, Covid) Nasopharyngeal Swab     Status: None   Collection Time: 02/21/21  7:36 PM   Specimen: Nasopharyngeal Swab; Nasopharyngeal(NP) swabs in vial  transport medium  Result Value Ref Range Status   SARS Coronavirus 2 by RT PCR NEGATIVE NEGATIVE Final    Comment: (NOTE) SARS-CoV-2 target nucleic acids are NOT DETECTED.  The SARS-CoV-2 RNA is generally detectable in upper respiratory specimens during the acute phase of infection. The lowest concentration of SARS-CoV-2 viral copies this assay can detect is 138 copies/mL. A negative result does not preclude SARS-Cov-2 infection and should not be used as the sole basis for treatment or other patient management decisions. A negative result may occur with  improper specimen collection/handling, submission of specimen other than nasopharyngeal swab, presence of viral mutation(s) within the areas targeted by this assay, and inadequate number of viral copies(<138 copies/mL). A negative result must be combined with clinical observations, patient history, and epidemiological information. The expected result is Negative.  Fact Sheet for Patients:  EntrepreneurPulse.com.au  Fact Sheet for Healthcare Providers:  IncredibleEmployment.be  This test is no t yet approved or cleared by the [place] FDA and  has been authorized for detection and/or diagnosis of SARS-CoV-2 by FDA under an Emergency Use Authorization (EUA). This EUA will remain  in effect (meaning this test can be used) for the duration of the COVID-19 declaration under Section 564(b)(1) of the Act, 21 U.S.C.section  360bbb-3(b)(1), unless the authorization is terminated  or revoked sooner.       Influenza A by PCR NEGATIVE NEGATIVE Final   Influenza B by PCR NEGATIVE NEGATIVE Final    Comment: (NOTE) The Xpert Xpress SARS-CoV-2/FLU/RSV plus assay is intended as an aid in the diagnosis of influenza from Nasopharyngeal swab specimens and should not be used as a sole basis for treatment. Nasal washings and aspirates are unacceptable for Xpert Xpress SARS-CoV-2/FLU/RSV testing.  Fact Sheet for Patients: EntrepreneurPulse.com.au  Fact Sheet for Healthcare Providers: IncredibleEmployment.be  This test is not yet approved or cleared by the [place] FDA and has been authorized for detection and/or diagnosis of SARS-CoV-2 by FDA under an Emergency Use Authorization (EUA). This EUA will remain in effect (meaning this test can be used) for the duration of the COVID-19 declaration under Section 564(b)(1) of the Act, 21 U.S.C. section 360bbb-3(b)(1), unless the authorization is terminated or revoked.  Performed at [facility], [address], [place], [place] [address]          Radiology Studies: CT ABDOMEN PELVIS W CONTRAST  Result Date: 02/21/2021 CLINICAL DATA:  Lower abdominal pain. History of diverticulitis. 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 EXAM: CT ABDOMEN AND PELVIS WITH CONTRAST TECHNIQUE: Multidetector CT imaging of the abdomen and pelvis was performed using the standard protocol following bolus administration of intravenous contrast. CONTRAST:  71mL OMNIPAQUE IOHEXOL 300 MG/ML  SOLN COMPARISON:  None. FINDINGS: Lower chest: No basilar consolidation or pleural effusion. Minimal posterior pleural thickening on the left. Coronary artery calcifications. Epicardial leads in place. Hepatobiliary: Minimal focal fatty infiltration adjacent to the gallbladder fossa. No suspicious hepatic lesion. No calcified  gallstone or pericholecystic fat stranding. No biliary dilatation. Pancreas: No ductal dilatation or inflammation. Spleen: Normal in size without focal abnormality. Adrenals/Urinary Tract: Normal adrenal glands. Lobulated bilateral renal contours. Slight atrophy of left kidney. No hydronephrosis. No perinephric edema. Tiny cyst in the upper left kidney. No visualized renal stone. Symmetric excretion on delayed phase imaging. The urinary bladder is displaced into the left pelvis. There is a in bladder or fluid level. No perivesicular fat stranding. No definite bladder wall thickening. Loss of fat plane between the superior aspect  of the urinary bladder and a sigmoid colonic diverticulum. Stomach/Bowel: Pericolonic fat stranding about the proximal sigmoid colon, series 2, image 62, in the region of multiple colonic diverticula suspicious for diverticulitis. This fat stranding extends to the adjacent urinary bladder. There are numerable colonic diverticula from the splenic flexure distally. Large volume of colonic stool from the splenic flexure distally. Mild sigmoid colonic wall thickening which is nonspecific. There are scattered right colonic diverticula. Transverse colon is tortuous. Small hiatal hernia. Stomach is decompressed. No small bowel obstruction or inflammation. Normal air-filled appendix. Vascular/Lymphatic: Moderately advanced aortic atherosclerosis. Irregular plaque in the upper abdominal aorta. Incidental 2 right and left renal arteries. No aneurysm. Calcified plaque throughout the left common iliac artery causes some degree of stenosis. Patent portal vein. No portal or mesenteric venous gas. Multiple small retroperitoneal lymph nodes, typically reactive. No bulky abdominopelvic adenopathy. Reproductive: Uterus remains in situ, atrophic and deviated to the left. No adnexal mass. Other: No free air or focal fluid collection. Minimal free fluid in the left pelvis. No abdominal wall hernia.  Musculoskeletal: Chronic but progressive moderately severe T11 compression fracture. Increase vertebral body height loss since 11/23/2019 thoracic spine CT, particularly anteriorly. Hemi transitional lumbosacral anatomy with enlarged right transverse process and pseudoarticulation with the sacrum. Bones are subjectively under mineralized. IMPRESSION: 1. Advanced colonic diverticulosis with pericolonic fat stranding about the proximal sigmoid colon, in the region of multiple colonic diverticula, suspicious for acute diverticulitis. This fat stranding extends to the adjacent urinary bladder. 2. Fluid level in the urinary bladder. This may be due to Foley catheter/instrumentation, or fistula as provided history. Pericolonic edema extends to the anterior aspect of the bladder. 3. Large volume of colonic stool from the splenic flexure distally, suggesting constipation. There is wall thickening in the mid sigmoid colon that is nonspecific. Recommend up-to-date colonoscopy to exclude the possibility of colonic neoplasm. 4. Chronic but progressive moderately severe T11 compression fracture. Increase vertebral body height loss since 11/23/2019 thoracic spine CT, particularly anteriorly. Aortic Atherosclerosis (ICD10-I70.0). Electronically Signed   By: Keith Rake M.D.   On: 02/21/2021 16:54      Scheduled Meds: . atorvastatin  20 mg Oral Daily  . bisacodyl  10 mg Rectal Once  . fluticasone  2 spray Each Nare Daily  . levothyroxine  88 mcg Oral Q0600   Continuous Infusions: . sodium chloride 100 mL/hr at 02/21/21 2258  . ciprofloxacin 400 mg (02/22/21 0531)  . metronidazole 500 mg (02/22/21 1214)     LOS: 1 day      Debbe Odea, MD [facility] Pager: [email or web address] 02/22/2021, 4:56 PM

## 2021-02-22 NOTE — Progress Notes (Signed)
OT Cancellation Note  Patient Details Name: Desiree Madden MRN: [id number] DOB: September 15, 1951   Cancelled Treatment:    Reason Eval/Treat Not Completed: OT screened, no needs identified, will sign off. Consult received, chart reviewed. Pt at baseline independence for ADL/ADL mobility. Pt denies difficulties and denies need for OT services. Will sign off. Please re-consult if additional needs arise.   Hanley Hays, MPH, MS, OTR/L ascom [phone] 02/22/21, 1:29 PM

## 2021-02-22 NOTE — Evaluation (Signed)
Physical Therapy Evaluation Patient Details Name: Desiree Madden MRN: [id number] DOB: 07-Aug-1951 Today's Date: 02/22/2021   History of Present Illness  70 y.o. female with medical history significant of colon-Bladder fistula and diverticulitis, hypertension hypothyroidism, HLD, ulcerative colitis        Presented with abdominal pain starting since today severe in the lower abdomen.  Pt has had similar diverticulitis episodes in the past.  Clinical Impression  Pt laying in bed on arrival in pain but more than willing to get up and do some walking.  She ultimately did very well and had appropriate mobility, strength, activity tolerance, etc.  She is essentially baseline apart from severe abdominal pain (that is similar to prior diverticulitis flares).  Pt safe to return home once medical issues are address, not further PT indicated at this time, will sign off.   Follow Up Recommendations No PT follow up    Equipment Recommendations  None recommended by PT    Recommendations for Other Services       Precautions / Restrictions Precautions Precautions: None Restrictions Weight Bearing Restrictions: No      Mobility  Bed Mobility Overal bed mobility: Independent             General bed mobility comments: pt easily gets to sitting EOB, back to supine post ambulation    Transfers Overall transfer level: Independent Equipment used: None             General transfer comment: Pt rose to standing confidently and independently, apart from abdominal pain guarding no issues at all  Ambulation/Gait Ambulation/Gait assistance: Independent Gait Distance (Feet): 120 Feet Assistive device: None       General Gait Details: Pt walked at community appropraite speed with consistent cadence, no unsteadiness or fatigue and apart from (significant) abdominal pain she had not issues with the effort  Stairs Stairs:  (deferred secondary to abdominal discomfort)          Wheelchair  Mobility    Modified Rankin (Stroke Patients Only)       Balance Overall balance assessment: Independent                                           Pertinent Vitals/Pain Pain Assessment: 0-10 Pain Score: 9  Pain Location: abdomen, especially LLQ    Home Living Family/patient expects to be discharged to:: Private residence Living Arrangements: Spouse/significant other     Home Access: Stairs to enter   Technical brewer of Steps: 3          Prior Function Level of Independence: Independent         Comments: Pt reports that she is active in the community and independent w/o AD.     Hand Dominance        Extremity/Trunk Assessment   Upper Extremity Assessment Upper Extremity Assessment: Overall WFL for tasks assessed    Lower Extremity Assessment Lower Extremity Assessment: Overall WFL for tasks assessed (pain guarded but functional t/o)       Communication   Communication: No difficulties  Cognition Arousal/Alertness: Awake/alert Behavior During Therapy: WFL for tasks assessed/performed Overall Cognitive Status: Within Functional Limits for tasks assessed  General Comments      Exercises     Assessment/Plan    PT Assessment Patent does not need any further PT services  PT Problem List         PT Treatment Interventions      PT Goals (Current goals can be found in the Care Plan section)  Acute Rehab PT Goals Patient Stated Goal: go home PT Goal Formulation: All assessment and education complete, DC therapy    Frequency     Barriers to discharge        Co-evaluation               AM-PAC PT "6 Clicks" Mobility  Outcome Measure Help needed turning from your back to your side while in a flat bed without using bedrails?: None Help needed moving from lying on your back to sitting on the side of a flat bed without using bedrails?: None Help needed moving  to and from a bed to a chair (including a wheelchair)?: None Help needed standing up from a chair using your arms (e.g., wheelchair or bedside chair)?: None Help needed to walk in hospital room?: None Help needed climbing 3-5 steps with a railing? : None 6 Click Score: 24    End of Session Equipment Utilized During Treatment: Gait belt Activity Tolerance: Patient tolerated treatment well Patient left: in bed   PT Visit Diagnosis: Pain;Difficulty in walking, not elsewhere classified (R26.2) Pain - part of body:  (abdominal)    Time: 6666-4861 PT Time Calculation (min) (ACUTE ONLY): 15 min   Charges:   PT Evaluation $PT Eval Low Complexity: 1 Low          Galen R Bratton, dPT 02/22/2021, 12:03 PM

## 2021-02-23 ENCOUNTER — Inpatient Hospital Stay: Payer: Medicare HMO

## 2021-02-23 DIAGNOSIS — K5900 Constipation, unspecified: Secondary | ICD-10-CM

## 2021-02-23 DIAGNOSIS — N390 Urinary tract infection, site not specified: Secondary | ICD-10-CM | POA: Diagnosis not present

## 2021-02-23 DIAGNOSIS — K5792 Diverticulitis of intestine, part unspecified, without perforation or abscess without bleeding: Secondary | ICD-10-CM | POA: Diagnosis not present

## 2021-02-23 DIAGNOSIS — R7989 Other specified abnormal findings of blood chemistry: Secondary | ICD-10-CM | POA: Diagnosis not present

## 2021-02-23 DIAGNOSIS — N322 Vesical fistula, not elsewhere classified: Secondary | ICD-10-CM | POA: Diagnosis not present

## 2021-02-23 LAB — BASIC METABOLIC PANEL
Anion gap: 6 (ref 5–15)
BUN: 24 mg/dL — ABNORMAL HIGH (ref 8–23)
CO2: 18 mmol/L — ABNORMAL LOW (ref 22–32)
Calcium: 8.2 mg/dL — ABNORMAL LOW (ref 8.9–10.3)
Chloride: 114 mmol/L — ABNORMAL HIGH (ref 98–111)
Creatinine, Ser: 1.19 mg/dL — ABNORMAL HIGH (ref 0.44–1.00)
GFR, Estimated: 49 mL/min — ABNORMAL LOW (ref >60)
Glucose, Bld: 148 mg/dL — ABNORMAL HIGH (ref 70–99)
Potassium: 3.7 mmol/L (ref 3.5–5.1)
Sodium: 138 mmol/L (ref 135–145)

## 2021-02-23 LAB — LACTIC ACID, PLASMA: Lactic Acid, Venous: 2.1 mmol/L (ref 0.5–1.9)

## 2021-02-23 LAB — CBC
HCT: 34.8 % — ABNORMAL LOW (ref 36.0–46.0)
Hemoglobin: 11.5 g/dL — ABNORMAL LOW (ref 12.0–15.0)
MCH: 30.1 pg (ref 26.0–34.0)
MCHC: 33 g/dL (ref 30.0–36.0)
MCV: 91.1 fL (ref 80.0–100.0)
Platelets: 223 10*3/uL (ref 150–400)
RBC: 3.82 MIL/uL — ABNORMAL LOW (ref 3.87–5.11)
RDW: 14.2 % (ref 11.5–15.5)
WBC: 27.7 10*3/uL — ABNORMAL HIGH (ref 4.0–10.5)
nRBC: 0 % (ref 0.0–0.2)

## 2021-02-23 MED ORDER — BISACODYL 10 MG RE SUPP
10.0000 mg | Freq: Once | RECTAL | Status: AC
  Administered 2021-02-23: 10 mg via RECTAL
  Filled 2021-02-23: qty 1

## 2021-02-23 MED ORDER — PIPERACILLIN-TAZOBACTAM 3.375 G IVPB
3.3750 g | Freq: Three times a day (TID) | INTRAVENOUS | Status: DC
  Administered 2021-02-23 – 2021-02-28 (×15): 3.375 g via INTRAVENOUS
  Filled 2021-02-23 (×15): qty 50

## 2021-02-23 MED ORDER — LEVOTHYROXINE SODIUM 50 MCG PO TABS
75.0000 ug | ORAL_TABLET | Freq: Every day | ORAL | Status: DC
  Administered 2021-02-24 – 2021-03-01 (×6): 75 ug via ORAL
  Filled 2021-02-23: qty 2
  Filled 2021-02-23 (×5): qty 1

## 2021-02-23 NOTE — Progress Notes (Addendum)
PROGRESS NOTE    Desiree Madden   WUJ:[id number]  DOB: [birth year]  DOA: 02/21/2021 PCP: [facility]   Brief Narrative:  Desiree Madden is a 70 year old female with ulcerative colitis, diverticulosis who has a colovesical fistula.  She also has a history of hypothyroidism and hyperlipidemia. She presented to the hospital with severe abdominal pain and burning micturition.  She also vomiting 3 times on the day of admission.  She has been on and off antibiotics over the past few months for recurrent urinary tract infections related to the fistula.  She states she was supposed to have some sort of surgery for the fistula this past fall, however, it did not occur for some reason.  She is now waiting to see a gastroenterologist at the [facility] clinic on April 1st.  CT scan in the ED reveals possible diverticulitis and also a large amount of stool in the left colon.  She was started on ciprofloxacin and Flagyl.   Subjective: Still has abd pain in LLQ. No BMs after Sorbitol.     Assessment & Plan:   Principal Problem:   Diverticulitis, UTI, vomiting, leukocytosis - vomiting likely related to infection + stool burden -  urine culture> e faecium- f/u sensitivities - on Cipro and Flagyl IV but WBC rising concerning for worsening infection and uncovered organism - will switch to Zosyn today  Active Problems:   Elevated lactic acid level - due to above- improved from 3.0 to 2.1  Constipation -No results after sorbitol-  Try a suppository    Fistula, colovesical in setting of Ulcerative colitis  - not on DMARDS or steroids - she has an appt for a GI eval next month-     Hyperlipidemia - Lipitor    Essential hypertension - hold Lopressor- follow BP    Hypothyroidism - cont Synthroid    AKI - Cr 1.40 has improved to 1.16- cont IVF as it is likely pre-renal  Normocytic anemia - follow   Time spent in minutes: 35 DVT prophylaxis: SCDs Start: 02/21/21 2200  Code  Status: Full code Family Communication:  Level of Care: Level of care: Progressive Cardiac Disposition Plan:  Status is: Inpatient  Remains inpatient appropriate because:IV treatments appropriate due to intensity of illness or inability to take PO Dispo: The patient is from: Home              Anticipated d/c is to: Home              Patient currently is not medically stable to d/c.   Difficult to place patient No   Consultants:   GI Procedures:   none Antimicrobials:  Anti-infectives (From admission, onward)   Start     Dose/Rate Route Frequency Ordered Stop   02/23/21 1230  piperacillin-tazobactam (ZOSYN) IVPB 3.375 g        3.375 g 12.5 mL/hr over 240 Minutes Intravenous Every 8 hours 02/23/21 1143     02/22/21 0600  ciprofloxacin (CIPRO) IVPB 400 mg  Status:  Discontinued        400 mg 200 mL/hr over 60 Minutes Intravenous Every 12 hours 02/21/21 2028 02/23/21 1143   02/22/21 0400  metroNIDAZOLE (FLAGYL) IVPB 500 mg  Status:  Discontinued        500 mg 100 mL/hr over 60 Minutes Intravenous Every 8 hours 02/21/21 2028 02/23/21 1143   02/21/21 1830  ciprofloxacin (CIPRO) IVPB 400 mg        400 mg 200 mL/hr over 60 Minutes Intravenous  Once 02/21/21 1820 02/21/21 1939   02/21/21 1830  metroNIDAZOLE (FLAGYL) IVPB 500 mg        500 mg 100 mL/hr over 60 Minutes Intravenous  Once 02/21/21 1820 02/21/21 2110       Objective: Vitals:   02/22/21 2019 02/23/21 0502 02/23/21 0757 02/23/21 1256  BP: 137/61 124/67 140/71 (!) 123/51  Pulse: 74 76 76 79  Resp: $Remo'18 17 17 16  'OyUpb$ Temp: 99.4 F (37.4 C) 98.9 F (37.2 C) (!) 97.5 F (36.4 C) 98.2 F (36.8 C)  TempSrc: Oral Oral Oral Oral  SpO2: 98% 96% 97% 99%  Weight:  58.4 kg    Height:        Intake/Output Summary (Last 24 hours) at 02/23/2021 1349 Last data filed at 02/22/2021 1900 Gross per 24 hour  Intake 1228.25 ml  Output -  Net 1228.25 ml   Filed Weights   02/21/21 2202 02/22/21 0453 02/23/21 0502  Weight: 59 kg 59  kg 58.4 kg    Examination: General exam: Appears comfortable  HEENT: PERRLA, oral mucosa moist, no sclera icterus or thrush Respiratory system: Clear to auscultation. Respiratory effort normal. Cardiovascular system: S1 & S2 heard, RRR.   Gastrointestinal system: Abdomen soft,  Tender in LLQ, nondistended. Normal bowel sounds. Central nervous system: Alert and oriented. No focal neurological deficits. Extremities: No cyanosis, clubbing or edema Skin: No rashes or ulcers Psychiatry:  Mood & affect appropriate.     Data Reviewed: I have personally reviewed following labs and imaging studies  CBC: Recent Labs  Lab 02/21/21 1543 02/22/21 0147 02/23/21 0816  WBC 17.9* 18.6* 27.7*  NEUTROABS  --  16.7*  --   HGB 11.0* 11.6* 11.5*  HCT 33.1* 34.3* 34.8*  MCV 89.2 89.6 91.1  PLT 267 238 716   Basic Metabolic Panel: Recent Labs  Lab 02/21/21 1543 02/22/21 0147 02/23/21 0816  NA 136 139 138  K 3.7 4.4 3.7  CL 106 111 114*  CO2 20* 19* 18*  GLUCOSE 158* 154* 148*  BUN 34* 28* 24*  CREATININE 1.40* 1.16* 1.19*  CALCIUM 9.0 8.3* 8.2*  MG  --  1.8  --   PHOS  --  3.5  --    GFR: Estimated Creatinine Clearance: 38 mL/min (A) (by C-G formula based on SCr of 1.19 mg/dL (H)). Liver Function Tests: Recent Labs  Lab 02/21/21 1543 02/22/21 0147  AST 38 36  ALT 24 23  ALKPHOS 79 73  BILITOT 0.9 1.1  PROT 8.0 7.6  ALBUMIN 3.9 3.6   Recent Labs  Lab 02/21/21 1543  LIPASE 54*   No results for input(s): AMMONIA in the last 168 hours. Coagulation Profile: No results for input(s): INR, PROTIME in the last 168 hours. Cardiac Enzymes: No results for input(s): CKTOTAL, CKMB, CKMBINDEX, TROPONINI in the last 168 hours. BNP (last 3 results) No results for input(s): PROBNP in the last 8760 hours. HbA1C: No results for input(s): HGBA1C in the last 72 hours. CBG: No results for input(s): GLUCAP in the last 168 hours. Lipid Profile: No results for input(s): CHOL, HDL,  LDLCALC, TRIG, CHOLHDL, LDLDIRECT in the last 72 hours. Thyroid Function Tests: Recent Labs    02/22/21 0147  TSH 1.173   Anemia Panel: No results for input(s): VITAMINB12, FOLATE, FERRITIN, TIBC, IRON, RETICCTPCT in the last 72 hours. Urine analysis:    Component Value Date/Time   COLORURINE YELLOW 02/21/2021 1543   APPEARANCEUR CLOUDY (A) 02/21/2021 1543   LABSPEC 1.020 02/21/2021 1543   PHURINE  7.0 02/21/2021 1543   GLUCOSEU NEGATIVE 02/21/2021 1543   HGBUR LARGE (A) 02/21/2021 1543   BILIRUBINUR NEGATIVE 02/21/2021 1543   KETONESUR NEGATIVE 02/21/2021 1543   PROTEINUR 100 (A) 02/21/2021 1543   NITRITE POSITIVE (A) 02/21/2021 1543   LEUKOCYTESUR LARGE (A) 02/21/2021 1543   Sepsis Labs: $RemoveBefo'@[email or web address]'[id number]$ (procalcitonin:4,lacticidven:4) ) Recent Results (from the past 240 hour(s))  Urine culture     Status: Abnormal (Preliminary result)   Collection Time: 02/21/21  3:43 PM   Specimen: Urine, Random  Result Value Ref Range Status   Specimen Description   Final    URINE, RANDOM Performed at [facility], [address] [place], [place] [address]    Special Requests   Final    NONE Performed at [facility], [address] [place], [place] [address]    Culture (A)  Final    >=100,000 COLONIES/mL ENTEROCOCCUS FAECIUM SUSCEPTIBILITIES TO FOLLOW Performed at [facility], [place] [address], [place], [place] [address]    Report Status PENDING  Incomplete  Resp Panel by RT-PCR (Flu A&B, Covid) Nasopharyngeal Swab     Status: None   Collection Time: 02/21/21  7:36 PM   Specimen: Nasopharyngeal Swab; Nasopharyngeal(NP) swabs in vial transport medium  Result Value Ref Range Status   SARS Coronavirus 2 by RT PCR NEGATIVE NEGATIVE Final    Comment: (NOTE) SARS-CoV-2 target nucleic acids are NOT DETECTED.  The SARS-CoV-2 RNA is generally detectable in upper respiratory specimens during the acute phase of infection. The lowest concentration of SARS-CoV-2  viral copies this assay can detect is 138 copies/mL. A negative result does not preclude SARS-Cov-2 infection and should not be used as the sole basis for treatment or other patient management decisions. A negative result may occur with  improper specimen collection/handling, submission of specimen other than nasopharyngeal swab, presence of viral mutation(s) within the areas targeted by this assay, and inadequate number of viral copies(<138 copies/mL). A negative result must be combined with clinical observations, patient history, and epidemiological information. The expected result is Negative.  Fact Sheet for Patients:  EntrepreneurPulse.com.au  Fact Sheet for Healthcare Providers:  IncredibleEmployment.be  This test is no t yet approved or cleared by the [place] FDA and  has been authorized for detection and/or diagnosis of SARS-CoV-2 by FDA under an Emergency Use Authorization (EUA). This EUA will remain  in effect (meaning this test can be used) for the duration of the COVID-19 declaration under Section 564(b)(1) of the Act, 21 U.S.C.section 360bbb-3(b)(1), unless the authorization is terminated  or revoked sooner.       Influenza A by PCR NEGATIVE NEGATIVE Final   Influenza B by PCR NEGATIVE NEGATIVE Final    Comment: (NOTE) The Xpert Xpress SARS-CoV-2/FLU/RSV plus assay is intended as an aid in the diagnosis of influenza from Nasopharyngeal swab specimens and should not be used as a sole basis for treatment. Nasal washings and aspirates are unacceptable for Xpert Xpress SARS-CoV-2/FLU/RSV testing.  Fact Sheet for Patients: EntrepreneurPulse.com.au  Fact Sheet for Healthcare Providers: IncredibleEmployment.be  This test is not yet approved or cleared by the [place] FDA and has been authorized for detection and/or diagnosis of SARS-CoV-2 by FDA under an Emergency Use Authorization  (EUA). This EUA will remain in effect (meaning this test can be used) for the duration of the COVID-19 declaration under Section 564(b)(1) of the Act, 21 U.S.C. section 360bbb-3(b)(1), unless the authorization is terminated or revoked.  Performed at [facility], [place],  [place], [place] [address]          Radiology Studies: CT ABDOMEN PELVIS W CONTRAST  Result Date: 02/21/2021 CLINICAL DATA:  Lower abdominal pain. History of diverticulitis. 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 EXAM: CT ABDOMEN AND PELVIS WITH CONTRAST TECHNIQUE: Multidetector CT imaging of the abdomen and pelvis was performed using the standard protocol following bolus administration of intravenous contrast. CONTRAST:  12mL OMNIPAQUE IOHEXOL 300 MG/ML  SOLN COMPARISON:  None. FINDINGS: Lower chest: No basilar consolidation or pleural effusion. Minimal posterior pleural thickening on the left. Coronary artery calcifications. Epicardial leads in place. Hepatobiliary: Minimal focal fatty infiltration adjacent to the gallbladder fossa. No suspicious hepatic lesion. No calcified gallstone or pericholecystic fat stranding. No biliary dilatation. Pancreas: No ductal dilatation or inflammation. Spleen: Normal in size without focal abnormality. Adrenals/Urinary Tract: Normal adrenal glands. Lobulated bilateral renal contours. Slight atrophy of left kidney. No hydronephrosis. No perinephric edema. Tiny cyst in the upper left kidney. No visualized renal stone. Symmetric excretion on delayed phase imaging. The urinary bladder is displaced into the left pelvis. There is a in bladder or fluid level. No perivesicular fat stranding. No definite bladder wall thickening. Loss of fat plane between the superior aspect of the urinary bladder and a sigmoid colonic diverticulum. Stomach/Bowel: Pericolonic fat stranding about the proximal sigmoid colon, series 2, image 62, in the region of multiple  colonic diverticula suspicious for diverticulitis. This fat stranding extends to the adjacent urinary bladder. There are numerable colonic diverticula from the splenic flexure distally. Large volume of colonic stool from the splenic flexure distally. Mild sigmoid colonic wall thickening which is nonspecific. There are scattered right colonic diverticula. Transverse colon is tortuous. Small hiatal hernia. Stomach is decompressed. No small bowel obstruction or inflammation. Normal air-filled appendix. Vascular/Lymphatic: Moderately advanced aortic atherosclerosis. Irregular plaque in the upper abdominal aorta. Incidental 2 right and left renal arteries. No aneurysm. Calcified plaque throughout the left common iliac artery causes some degree of stenosis. Patent portal vein. No portal or mesenteric venous gas. Multiple small retroperitoneal lymph nodes, typically reactive. No bulky abdominopelvic adenopathy. Reproductive: Uterus remains in situ, atrophic and deviated to the left. No adnexal mass. Other: No free air or focal fluid collection. Minimal free fluid in the left pelvis. No abdominal wall hernia. Musculoskeletal: Chronic but progressive moderately severe T11 compression fracture. Increase vertebral body height loss since 11/23/2019 thoracic spine CT, particularly anteriorly. Hemi transitional lumbosacral anatomy with enlarged right transverse process and pseudoarticulation with the sacrum. Bones are subjectively under mineralized. IMPRESSION: 1. Advanced colonic diverticulosis with pericolonic fat stranding about the proximal sigmoid colon, in the region of multiple colonic diverticula, suspicious for acute diverticulitis. This fat stranding extends to the adjacent urinary bladder. 2. Fluid level in the urinary bladder. This may be due to Foley catheter/instrumentation, or fistula as provided history. Pericolonic edema extends to the anterior aspect of the bladder. 3. Large volume of colonic stool from the  splenic flexure distally, suggesting constipation. There is wall thickening in the mid sigmoid colon that is nonspecific. Recommend up-to-date colonoscopy to exclude the possibility of colonic neoplasm. 4. Chronic but progressive moderately severe T11 compression fracture. Increase vertebral body height loss since 11/23/2019 thoracic spine CT, particularly anteriorly. Aortic Atherosclerosis (ICD10-I70.0). Electronically Signed   By: Keith Rake M.D.   On: 02/21/2021 16:54   DG Abd Portable 2V  Result Date: 02/23/2021 CLINICAL DATA:  Abdominal pain, diverticulitis EXAM: PORTABLE ABDOMEN - 2 VIEW COMPARISON:  02/21/2021 CT abdomen/pelvis FINDINGS: Intact visualized  lower sternotomy wires. No dilated small bowel loops. Large diffuse colonic stool volume. No evidence pneumatosis or pneumoperitoneum. No radiopaque nephrolithiasis. IMPRESSION: Large diffuse colonic stool volume, suggesting constipation. Nonobstructive bowel gas pattern. No evidence of pneumoperitoneum. Electronically Signed   By: Ilona Sorrel M.D.   On: 02/23/2021 10:38      Scheduled Meds: . atorvastatin  20 mg Oral Daily  . bisacodyl  10 mg Rectal Once  . fluticasone  2 spray Each Nare Daily  . [START ON 02/24/2021] levothyroxine  75 mcg Oral Q0600   Continuous Infusions: . sodium chloride 100 mL/hr at 02/23/21 0415  . piperacillin-tazobactam (ZOSYN)  IV 3.375 g (02/23/21 1346)     LOS: 2 days      Debbe Odea, MD [facility] Pager: [email or web address] 02/23/2021, 1:49 PM

## 2021-02-23 NOTE — Consult Note (Signed)
Desiree Antigua, MD [address] [place], [place], [place], [address] [address], [place], [place], [place], [address] Phone: [phone]  Fax: [phone]  Consultation  Referring Provider:     Dr. Wynelle Cleveland Primary Care Physician:  [facility] Reason for Consultation:     Ulcerative colitis, diverticulitis history Primary gastroenterologist: Dr. Vida Roller, [facility]  Date of Admission:  02/21/2021 Date of Consultation:  02/23/2021         HPI:   Desiree Madden is a 70 y.o. female who presented with abdominal pain, burning micturition, vomiting with history of intermittent antibiotic use over the last few months due to recurrent UTIs related to a colovesical fistula.  CT on admission showed suspicion for acute diverticulitis based on pericolonic fat stranding seen about the proximal sigmoid colon in the region of multiple colonic diverticula.  Large volume stool burden also seen.  I have personally reviewed her previous records.  Patient was seen by primary gastroenterologist, Dr. Earlie Counts most recently in November 2021 and he notes that patient has had series of UTIs, ultimately found to have colovesicular fistula, as a consequence of previous episodes of diverticulitis.  She has been seen by colorectal surgery and recommended laparoscopic, possible open sigmoid resection with left ureteric stent.  This has not been done yet.  Colonoscopy was planned prior to the surgery but has been unable to be completed with attempts in October or November, but incomplete procedure due to poor prep both times.    Past Medical History:  Diagnosis Date  . Hypertension   . Thyroid disease     Past Surgical History:  Procedure Laterality Date  . CARDIAC SURGERY      Prior to Admission medications   Medication Sig Start Date End Date Taking? Authorizing Provider  alendronate (FOSAMAX) 70 MG tablet Take 70 mg by mouth once a week. Take with a full  glass of water on an empty stomach.   Yes [provider]  aspirin EC 81 MG tablet Take 81 mg by mouth daily.   Yes [provider]  atorvastatin (LIPITOR) 20 MG tablet Take 20 mg by mouth daily.   Yes [provider]  diclofenac Sodium (VOLTAREN) 1 % GEL Apply topically 4 (four) times daily.   Yes [provider]  fluticasone (FLONASE) 50 MCG/ACT nasal spray Place 2 sprays into both nostrils daily.   Yes [provider]  gabapentin (NEURONTIN) 300 MG capsule Take 300 mg by mouth 3 (three) times daily.   Yes [provider]  levothyroxine (SYNTHROID) 75 MCG tablet Take 75 mcg by mouth daily before breakfast.   Yes [provider]  meloxicam (MOBIC) 15 MG tablet Take 15 mg by mouth daily.   Yes [provider]  metoprolol succinate (TOPROL-XL) 50 MG 24 hr tablet Take 50 mg by mouth daily. 11/23/20  Yes [provider]  oxyCODONE (OXY IR/ROXICODONE) 5 MG immediate release tablet Take 5 mg by mouth every 12 (twelve) hours as needed.   Yes [provider]  traZODone (DESYREL) 100 MG tablet Take 50-100 mg by mouth at bedtime. Take 1/2 tablet by mouth each evening for sleep, can increase to '100mg'$  whole tablet after 1st week.   Yes [provider]    Family History  Problem Relation Age of Onset  . Arthritis Other   . CAD Mother      Social History   Tobacco Use  . Smoking status: Former Research scientist (life sciences)  . Smokeless tobacco:  Never Used  Vaping Use  . Vaping Use: Never used  Substance Use Topics  . Alcohol use: Never  . Drug use: Never    Allergies as of 02/21/2021 - Review Complete 02/21/2021  Allergen Reaction Noted  . Baclofen  05/14/2017  . Sertraline  10/21/2016  . Sulfa antibiotics  10/21/2016  . Ibuprofen Nausea And Vomiting 10/21/2016  . Trazodone and nefazodone  02/21/2021    Review of Systems:    All systems reviewed and negative except where noted in HPI.   Physical Exam:  Vital  signs in last 24 hours: Vitals:   02/23/21 0502 02/23/21 0757 02/23/21 1256 02/23/21 1701  BP: 124/67 140/71 (!) 123/51 121/67  Pulse: 76 76 79 76  Resp: $Remo'17 17 16 16  'oVXqm$ Temp: 98.9 F (37.2 C) (!) 97.5 F (36.4 C) 98.2 F (36.8 C) 98 F (36.7 C)  TempSrc: Oral Oral Oral Oral  SpO2: 96% 97% 99% 95%  Weight: 58.4 kg     Height:         General:   Pleasant, cooperative in NAD Head:  Normocephalic and atraumatic. Eyes:   No icterus.   Conjunctiva pink. PERRLA. Ears:  Normal auditory acuity. Neck:  Supple; no masses or thyroidomegaly Lungs: Respirations even and unlabored. Lungs clear to auscultation bilaterally.   No wheezes, crackles, or rhonchi.  Abdomen:  Soft, nondistended, nontender. Normal bowel sounds. No appreciable masses or hepatomegaly.  No rebound or guarding.  Neurologic:  Alert and oriented x3;  grossly normal neurologically. Skin:  Intact without significant lesions or rashes. Cervical Nodes:  No significant cervical adenopathy. Psych:  Alert and cooperative. Normal affect.  LAB RESULTS: Recent Labs    02/21/21 1543 02/22/21 0147 02/23/21 0816  WBC 17.9* 18.6* 27.7*  HGB 11.0* 11.6* 11.5*  HCT 33.1* 34.3* 34.8*  PLT 267 238 223   BMET Recent Labs    02/21/21 1543 02/22/21 0147 02/23/21 0816  NA 136 139 138  K 3.7 4.4 3.7  CL 106 111 114*  CO2 20* 19* 18*  GLUCOSE 158* 154* 148*  BUN 34* 28* 24*  CREATININE 1.40* 1.16* 1.19*  CALCIUM 9.0 8.3* 8.2*   LFT Recent Labs    02/22/21 0147  PROT 7.6  ALBUMIN 3.6  AST 36  ALT 23  ALKPHOS 73  BILITOT 1.1   PT/INR No results for input(s): LABPROT, INR in the last 72 hours.  STUDIES: DG Abd Portable 2V  Result Date: 02/23/2021 CLINICAL DATA:  Abdominal pain, diverticulitis EXAM: PORTABLE ABDOMEN - 2 VIEW COMPARISON:  02/21/2021 CT abdomen/pelvis FINDINGS: Intact visualized lower sternotomy wires. No dilated small bowel loops. Large diffuse colonic stool volume. No evidence pneumatosis or  pneumoperitoneum. No radiopaque nephrolithiasis. IMPRESSION: Large diffuse colonic stool volume, suggesting constipation. Nonobstructive bowel gas pattern. No evidence of pneumoperitoneum. Electronically Signed   By: Ilona Sorrel M.D.   On: 02/23/2021 10:38      Impression / Plan:   Ilene Witcher is a 70 y.o. y/o female with diverticulitis on admission, with history of colovesicular fistula from previous episodes of diverticulitis/UTI, evaluated by surgery as an outpatient and surgical intervention/sigmoid resection pending  The history of ulcerative colitis listed in her chart is questionable at best.  She is not on any medications for ulcerative colitis and her primary GI notes state the same.  She is not having any bloody diarrhea or signs of active or exacerbated ulcerative colitis.  Her primary GI was working on her constipation issues with MiraLAX and considering starting  pharmacologic therapy to treat her constipation and then eventually reattempt a colonoscopy once the constipation is better  Patient is questioning about doing a colonoscopy while an inpatient.  In the setting of acute diverticulitis, would not recommend colonoscopy in this setting, especially with multiple failed attempts and poor prep.  Colonoscopy at this time would have higher risks than benefits  Continue treatment for diverticulitis and UTI as per primary team  Management of colovesical fistula will need to be done by her primary surgeon as previously planned.  However, if there are any inpatient questions in this regard, would recommend primary team to consult general surgery  White count noted to be elevated today in the setting of UTI, diverticulitis.  Would recommend surgery consult given previous history of recurrent diverticulitis as well.  If white count or diverticulitis picture continues to worsen, may need repeat imaging or surgical intervention as determined by surgery team  Minimize narcotics due to  patient's constipation issues  If does not have bowel movement with oral bowel regimen, can use 250 cc warm tap water enemas once or twice a day until moderate to large bowel movement  Dr. Haig Prophet to follow over the weekend  Thank you for involving me in the care of this patient.      LOS: 2 days   Virgel Manifold, MD  02/23/2021, 5:24 PM

## 2021-02-23 NOTE — Care Management Important Message (Signed)
Important Message  Patient Details  Name: Desiree Madden MRN: [id number] Date of Birth: 24-Jun-1951   Medicare Important Message Given:  Yes     Dannette Barbara 02/23/2021, 12:35 PM

## 2021-02-24 ENCOUNTER — Inpatient Hospital Stay: Payer: Medicare HMO

## 2021-02-24 ENCOUNTER — Encounter: Payer: Self-pay | Admitting: Internal Medicine

## 2021-02-24 DIAGNOSIS — K5641 Fecal impaction: Secondary | ICD-10-CM | POA: Diagnosis not present

## 2021-02-24 DIAGNOSIS — N390 Urinary tract infection, site not specified: Secondary | ICD-10-CM | POA: Diagnosis not present

## 2021-02-24 DIAGNOSIS — R7989 Other specified abnormal findings of blood chemistry: Secondary | ICD-10-CM | POA: Diagnosis not present

## 2021-02-24 DIAGNOSIS — N322 Vesical fistula, not elsewhere classified: Secondary | ICD-10-CM | POA: Diagnosis not present

## 2021-02-24 DIAGNOSIS — K5792 Diverticulitis of intestine, part unspecified, without perforation or abscess without bleeding: Secondary | ICD-10-CM | POA: Diagnosis not present

## 2021-02-24 DIAGNOSIS — R1032 Left lower quadrant pain: Secondary | ICD-10-CM

## 2021-02-24 LAB — CBC
HCT: 36.2 % (ref 36.0–46.0)
Hemoglobin: 11.8 g/dL — ABNORMAL LOW (ref 12.0–15.0)
MCH: 30.3 pg (ref 26.0–34.0)
MCHC: 32.6 g/dL (ref 30.0–36.0)
MCV: 92.8 fL (ref 80.0–100.0)
Platelets: 240 10*3/uL (ref 150–400)
RBC: 3.9 MIL/uL (ref 3.87–5.11)
RDW: 14.3 % (ref 11.5–15.5)
WBC: 29.6 10*3/uL — ABNORMAL HIGH (ref 4.0–10.5)
nRBC: 0 % (ref 0.0–0.2)

## 2021-02-24 LAB — BASIC METABOLIC PANEL
Anion gap: 5 (ref 5–15)
BUN: 31 mg/dL — ABNORMAL HIGH (ref 8–23)
CO2: 17 mmol/L — ABNORMAL LOW (ref 22–32)
Calcium: 8.3 mg/dL — ABNORMAL LOW (ref 8.9–10.3)
Chloride: 114 mmol/L — ABNORMAL HIGH (ref 98–111)
Creatinine, Ser: 1.5 mg/dL — ABNORMAL HIGH (ref 0.44–1.00)
GFR, Estimated: 37 mL/min — ABNORMAL LOW (ref >60)
Glucose, Bld: 120 mg/dL — ABNORMAL HIGH (ref 70–99)
Potassium: 4 mmol/L (ref 3.5–5.1)
Sodium: 136 mmol/L (ref 135–145)

## 2021-02-24 LAB — URINE CULTURE: Culture: >=100000 — AB

## 2021-02-24 LAB — LACTIC ACID, PLASMA: Lactic Acid, Venous: 2.2 mmol/L (ref 0.5–1.9)

## 2021-02-24 MED ORDER — IOHEXOL 300 MG/ML  SOLN
75.0000 mL | Freq: Once | INTRAMUSCULAR | Status: AC | PRN
  Administered 2021-02-24: 75 mL via INTRAVENOUS

## 2021-02-24 MED ORDER — POLYETHYLENE GLYCOL 3350 17 GM/SCOOP PO POWD
1.0000 | Freq: Once | ORAL | Status: AC
  Administered 2021-02-24: 255 g via ORAL
  Filled 2021-02-24: qty 255

## 2021-02-24 MED ORDER — IOHEXOL 9 MG/ML PO SOLN
500.0000 mL | ORAL | Status: AC
  Administered 2021-02-24 (×2): 500 mL via ORAL

## 2021-02-24 NOTE — Consult Note (Signed)
[facility] SURGICAL CONSULTATION NOTE (initial) - cpt: 00174  HISTORY OF PRESENT ILLNESS (HPI):  70 y.o. female admitted March 16.  Patient reports days since significant bowel activity.  Being treated for diverticulitis with antibiotics, due to rising leukocytosis antibiotics changed to Zosyn yesterday.  White blood cell count elevated once again, prompting CT scan examination. CT scan is rather remarkable for the extensive degree of sigmoid colonic diverticulosis.  There appears to be marked edema, and I would suspect perhaps a degree of high impaction may be causing the masslike findings.  GI was consulted yesterday and discouraged intervention with colonoscopy which is prudent, advising an oral bowel regimen of which I saw no further details.  She has been on a clear liquid diet and tolerating it well.  She has had very little bowel activity, indicating a very small stool and some gas yesterday. Long story short, it appears a colovesical fistula was appreciated fall 2021.  Work-ups were bogged down by a failure to obtain an adequate bowel prep for colonoscopy.  Surgery seemed to be aborted by this criteria.    Surgery is consulted by hospitalist physician Dr. Debbe Odea in this context for evaluation and management of possibly relentless diverticulitis, potentially complicated by chronic constipation/impaction.  PAST MEDICAL HISTORY (PMH):  Past Medical History:  Diagnosis Date  . Hypertension   . Thyroid disease      PAST SURGICAL HISTORY ([place]):  Past Surgical History:  Procedure Laterality Date  . CARDIAC SURGERY       MEDICATIONS:  Prior to Admission medications   Medication Sig Start Date End Date Taking? Authorizing Provider  alendronate (FOSAMAX) 70 MG tablet Take 70 mg by mouth once a week. Take with a full glass of water on an empty stomach.   Yes [provider]  aspirin EC 81 MG tablet Take 81 mg by mouth daily.   Yes [provider]   atorvastatin (LIPITOR) 20 MG tablet Take 20 mg by mouth daily.   Yes [provider]  diclofenac Sodium (VOLTAREN) 1 % GEL Apply topically 4 (four) times daily.   Yes [provider]  fluticasone (FLONASE) 50 MCG/ACT nasal spray Place 2 sprays into both nostrils daily.   Yes [provider]  gabapentin (NEURONTIN) 300 MG capsule Take 300 mg by mouth 3 (three) times daily.   Yes [provider]  levothyroxine (SYNTHROID) 75 MCG tablet Take 75 mcg by mouth daily before breakfast.   Yes [provider]  meloxicam (MOBIC) 15 MG tablet Take 15 mg by mouth daily.   Yes [provider]  metoprolol succinate (TOPROL-XL) 50 MG 24 hr tablet Take 50 mg by mouth daily. 11/23/20  Yes [provider]  oxyCODONE (OXY IR/ROXICODONE) 5 MG immediate release tablet Take 5 mg by mouth every 12 (twelve) hours as needed.   Yes [provider]  traZODone (DESYREL) 100 MG tablet Take 50-100 mg by mouth at bedtime. Take 1/2 tablet by mouth each evening for sleep, can increase to $RemoveBef'100mg'ZKFKPDsKws$  whole tablet after 1st week.   Yes [provider]     ALLERGIES:  Allergies  Allergen Reactions  . Baclofen     Other reaction(s): Headache  . Sertraline     Other reaction(s): Other (See Comments), Other (See Comments) Muscle aches Muscle aches   . Sulfa Antibiotics     Other reaction(s): Other (See Comments), Other (See Comments) Pt was told not to take it due to colitis Pt was told not to  take it due to colitis   . Ibuprofen Nausea And Vomiting  . Trazodone And Nefazodone      SOCIAL HISTORY:  Social History   Socioeconomic History  . Marital status: Married    Spouse name: Not on file  . Number of children: Not on file  . Years of education: Not on file  . Highest education level: Not on file  Occupational History  . Not on file  Tobacco Use  . Smoking status: Former Research scientist (life sciences)  . Smokeless tobacco: Never Used  Vaping Use  . Vaping  Use: Never used  Substance and Sexual Activity  . Alcohol use: Never  . Drug use: Never  . Sexual activity: Not on file  Other Topics Concern  . Not on file  Social History Narrative  . Not on file   Social Determinants of Health   Financial Resource Strain: Not on file  Food Insecurity: Not on file  Transportation Needs: Not on file  Physical Activity: Not on file  Stress: Not on file  Social Connections: Not on file  Intimate Partner Violence: Not on file     FAMILY HISTORY:  Family History  Problem Relation Age of Onset  . Arthritis Other   . CAD Mother       REVIEW OF SYSTEMS:  Review of Systems  Constitutional: Positive for malaise/fatigue. Negative for chills, fever and weight loss.  HENT: Negative for congestion, ear pain and sore throat.   Eyes: Negative for double vision.  Respiratory: Negative for shortness of breath and wheezing.   Cardiovascular: Negative for chest pain, palpitations and leg swelling.  Gastrointestinal: Positive for abdominal pain and constipation. Negative for blood in stool, diarrhea, heartburn and melena.  Genitourinary: Positive for dysuria, frequency and urgency. Negative for flank pain and hematuria.  Musculoskeletal: Negative for back pain and joint pain.  Neurological: Negative for tingling, speech change, focal weakness and headaches.  Psychiatric/Behavioral: Negative for depression and memory loss. The patient is not nervous/anxious.      VITAL SIGNS:  Temp:  [97.7 F (36.5 C)-98.6 F (37 C)] 97.7 F (36.5 C) (03/19 1601) Pulse Rate:  [77-103] 103 (03/19 1601) Resp:  [17-18] 18 (03/19 1601) BP: (109-136)/(53-72) 127/72 (03/19 1601) SpO2:  [95 %-98 %] 98 % (03/19 1601) Weight:  [67.2 kg] 67.2 kg (03/19 0609)     Height: $Remove'5\' 4"'FNFtUAX$  (162.6 cm) Weight: 67.2 kg BMI (Calculated): 25.41   INTAKE/OUTPUT:  03/18 0701 - 03/19 0700 In: 2852.8 [P.O.:120; I.V.:2669.4; IV Piggyback:63.4] Out: 1 [Urine:1]  PHYSICAL EXAM:  Physical  Exam Blood pressure 127/72, pulse (!) 103, temperature 97.7 F (36.5 C), resp. rate 18, height $RemoveBe'5\' 4"'vlTdBhiNj$  (1.626 m), weight 67.2 kg, SpO2 98 %. Last Weight  Most recent update: 02/24/2021  6:10 AM   Weight  67.2 kg (148 lb 1.6 oz)            CONSTITUTIONAL: Well developed, and nourished, appropriately responsive and aware without distress.  Nontoxic in appearance.   EYES: Sclera non-icteric.   EARS, NOSE, MOUTH AND THROAT:  Oral mucosa is pink and moist. Hearing is intact to voice.  NECK: Trachea is midline, and there is no jugular venous distension.  LYMPH NODES:  Lymph nodes in the neck are not enlarged. RESPIRATORY:  Lungs are clear, and breath sounds are equal bilaterally. Normal respiratory effort without pathologic use of accessory muscles. CARDIOVASCULAR: Heart is regular in rate and rhythm. GI: The abdomen is is remarkable for the tender horizontally oriented mass in the  lower abdomen.  Otherwise the epigastric region is soft, nontender, and nondistended.  There were normal bowel sounds.  MUSCULOSKELETAL:  Symmetrical muscle tone appreciated in all four extremities.    SKIN: Skin turgor is normal. No pathologic skin lesions appreciated.  NEUROLOGIC:  Motor and sensation appear grossly normal.  Cranial nerves are grossly without defect. PSYCH:  Alert and oriented to person, place and time. Affect is appropriate for situation.  Data Reviewed I have personally reviewed what is currently available of the patient's imaging, recent labs and medical records.    Labs:  CBC Latest Ref Rng & Units 02/24/2021 02/23/2021 02/22/2021  WBC 4.0 - 10.5 K/uL 29.6(H) 27.7(H) 18.6(H)  Hemoglobin 12.0 - 15.0 g/dL 11.8(L) 11.5(L) 11.6(L)  Hematocrit 36.0 - 46.0 % 36.2 34.8(L) 34.3(L)  Platelets 150 - 400 K/uL 240 223 238   CMP Latest Ref Rng & Units 02/24/2021 02/23/2021 02/22/2021  Glucose 70 - 99 mg/dL 120(H) 148(H) 154(H)  BUN 8 - 23 mg/dL 31(H) 24(H) 28(H)  Creatinine 0.44 - 1.00 mg/dL 1.50(H) 1.19(H)  1.16(H)  Sodium 135 - 145 mmol/L 136 138 139  Potassium 3.5 - 5.1 mmol/L 4.0 3.7 4.4  Chloride 98 - 111 mmol/L 114(H) 114(H) 111  CO2 22 - 32 mmol/L 17(L) 18(L) 19(L)  Calcium 8.9 - 10.3 mg/dL 8.3(L) 8.2(L) 8.3(L)  Total Protein 6.5 - 8.1 g/dL - - 7.6  Total Bilirubin 0.3 - 1.2 mg/dL - - 1.1  Alkaline Phos 38 - 126 U/L - - 73  AST 15 - 41 U/L - - 36  ALT 0 - 44 U/L - - 23     Imaging studies:  Radiology review:  Last 24 hrs: CT ABDOMEN PELVIS W CONTRAST  Result Date: 02/24/2021 CLINICAL DATA:  Lower abdominal and pelvic pain EXAM: CT ABDOMEN AND PELVIS WITH CONTRAST TECHNIQUE: Multidetector CT imaging of the abdomen and pelvis was performed using the standard protocol following bolus administration of intravenous contrast. CONTRAST:  61mL OMNIPAQUE IOHEXOL 300 MG/ML  SOLN COMPARISON:  02/21/2021 FINDINGS: Lower chest: New atelectasis in the lower lobes and lingula. Trace bilateral pleural effusions are likewise new. Prior CABG. Coronary and aortic atherosclerosis. Contrast medium in the distal esophagus suggest dysmotility or reflux. Hepatobiliary: Borderline distended gallbladder. No biliary dilatation. No focal hepatic parenchymal lesion identified. Pancreas: Unremarkable Spleen: Unremarkable Adrenals/Urinary Tract: Both adrenal glands appear within normal limits. Borderline right hydroureter extending to the iliac vessel cross over with the ureters obscured by surrounding inflammatory findings. No hydronephrosis or delay in excretion of contrast medium. Small amount of left lateral perinephric edema. Stomach/Bowel: Normal appendix. Descending and sigmoid colon diverticulosis with notably prominent caliber of the sigmoid colon, considerable paracolic stranding around the distal descending and sigmoid colon, and substantial localized wall thickening along a 9 cm segment of the distal sigmoid colon for example on image 70 of series 2 suspicious for progressive diverticulitis and likely some  superimposed regional colitis. Air-fluid level in the rectum. Peripheral intermediate density in the cecum for example on image 51 of series 2 simulates wall thickening, but there is no wall thickening in this region on 02/21/2021 hence this appearance is due to isodense fluid rather than actual cecal wall thickening. Vascular/Lymphatic: Aortoiliac atherosclerotic vascular disease. Scattered likely reactive retroperitoneal lymph nodes including an index left periaortic node measuring 0.8 cm in short axis on image 48 series 2. Reproductive: Unremarkable Other: New perihepatic and perisplenic ascites along with fluid tracking along the paracolic gutter. New pelvic ascites. No free intraperitoneal gas is identified. I  do not observe a discrete drainable abscess. Musculoskeletal: Unchanged compression fracture at T11. Lower lumbar spondylosis and degenerative disc disease. IMPRESSION: 1. Worsened prominent paracolic stranding around the distal descending and sigmoid colon, and substantial localized wall thickening along a 9 cm segment of the distal sigmoid colon suspicious for progressive diverticulitis and likely some superimposed regional colitis. No free intraperitoneal gas or discrete drainable abscess identified. 2. New perihepatic and perisplenic ascites along with fluid tracking along the paracolic gutters and pelvic ascites. 3. Trace bilateral pleural effusions with new atelectasis in the lower lobes and lingula. 4. Contrast medium in the distal esophagus suggest dysmotility or reflux. 5. Unchanged compression fracture at T11. 6. Borderline distended gallbladder. 7. Borderline right hydroureter extending to the iliac vessel cross over with the ureters obscured by surrounding inflammatory findings. No hydronephrosis or delay in excretion of contrast medium. 8. Aortic atherosclerosis. Aortic Atherosclerosis (ICD10-I70.0). Electronically Signed   By: Van Clines M.D.   On: 02/24/2021 13:58      Assessment/Plan:  70 y.o. female with what I believe is a high impaction complicating acute diverticulitis, possibly functional rectosigmoid obstruction, complicated by pertinent comorbidities including:.  Patient Active Problem List   Diagnosis Date Noted  . Diverticulitis 02/21/2021  . Fistula, bladder 02/21/2021  . Ulcerative colitis ([place]) 02/21/2021  . Hyperlipidemia 02/21/2021  . Essential hypertension 02/21/2021  . Hypothyroidism 02/21/2021  . Abnormal ECG 02/21/2021  . CKD (chronic kidney disease), stage III ([place]) 02/21/2021  . Elevated lactic acid level 02/21/2021    -We discussed options at length from surgery today, which would likely result in a colostomy, 2 attempts at the oral bowel regimen to alleviate the perceived impaction that may be exacerbating her colitis.  -We have only had a day of Zosyn thus far and hopefully can see some improvement in her leukocytosis.  Surprisingly she has had little evidence of toxicity, and she is highly motivated to try alternatives prior to considering an operation.  -I would initiate a bowel prep type of oral bowel regimen and I will proceed with ordering it.  I agree with GI that an enema may be worth the risk to help alleviate it if we do not have success with the oral regimen.  Would consider a therapeutic/diagnostic Gastrografin/water-soluble enema with the help of our radiologists perhaps on Monday if we do not see significant progress over the next 36 hours.  - DVT prophylaxis I will be so bold as to order the MiraLAX, and we will continue to follow with you  All of the above findings and recommendations were discussed with the patient, and all of patient's questions were answered to their expressed satisfaction.  Thank you for the opportunity to participate in this patient's care.   -- Ronny Bacon, M.D., FACS 02/24/2021, 6:14 PM

## 2021-02-24 NOTE — Progress Notes (Signed)
PROGRESS NOTE    Desiree Madden   GQB:[id number]  DOB: [birth year]  DOA: 02/21/2021 PCP: [place]   Brief Narrative:  Desiree Madden is a 70 year old female with ulcerative colitis, diverticulosis who has a colovesical fistula.  She also has a history of hypothyroidism and hyperlipidemia. She presented to the hospital with severe abdominal pain and burning micturition.  She also vomiting 3 times on the day of admission.  She has been on and off antibiotics over the past few months for recurrent urinary tract infections related to the fistula.  She states she was supposed to have some sort of surgery for the fistula this past fall, however, it did not occur for some reason.  She is now waiting to see a gastroenterologist at the [facility] clinic on April 1st.  CT scan in the ED reveals possible diverticulitis and also a large amount of stool in the left colon.  She was started on ciprofloxacin and Flagyl.   Subjective: She had 1 small BM yesterday after the Dulcolax suppository.     Assessment & Plan:   Principal Problem:   Diverticulitis, UTI, vomiting, leukocytosis - vomiting likely related to infection + stool burden -  urine culture> e faecium- f/u sensitivities - on Cipro and Flagyl IV but WBC rising concerning for worsening infection and uncovered organism -  switched to Zosyn 3/18 - WBC higher today to 29.6 & more tender as well- obtaining CT abd/pelvis STAT  Active Problems:   Elevated lactic acid level - due to above- improved from 3.0 to 2.1  Constipation -No results after sorbitol-  Try a suppository    Fistula, colovesical in setting of Ulcerative colitis  - not on DMARDS or steroids - she has an appt for a GI eval next month-     Hyperlipidemia - Lipitor    Essential hypertension - hold Lopressor- follow BP    Hypothyroidism - cont Synthroid    AKI - Cr 1.40 has improved to 1.16- cont IVF as it is likely pre-renal  Normocytic anemia -  follow   Time spent in minutes: 35 DVT prophylaxis: SCDs Start: 02/21/21 2200  Code Status: Full code Family Communication:  Level of Care: Level of care: Progressive Cardiac Disposition Plan:  Status is: Inpatient  Remains inpatient appropriate because:IV treatments appropriate due to intensity of illness or inability to take PO Dispo: The patient is from: Home              Anticipated d/c is to: Home              Patient currently is not medically stable to d/c.   Difficult to place patient No   Consultants:   GI Procedures:   none Antimicrobials:  Anti-infectives (From admission, onward)   Start     Dose/Rate Route Frequency Ordered Stop   02/23/21 1230  piperacillin-tazobactam (ZOSYN) IVPB 3.375 g        3.375 g 12.5 mL/hr over 240 Minutes Intravenous Every 8 hours 02/23/21 1143     02/22/21 0600  ciprofloxacin (CIPRO) IVPB 400 mg  Status:  Discontinued        400 mg 200 mL/hr over 60 Minutes Intravenous Every 12 hours 02/21/21 2028 02/23/21 1143   02/22/21 0400  metroNIDAZOLE (FLAGYL) IVPB 500 mg  Status:  Discontinued        500 mg 100 mL/hr over 60 Minutes Intravenous Every 8 hours 02/21/21 2028 02/23/21 1143   02/21/21 1830  ciprofloxacin (CIPRO) IVPB 400 mg  400 mg 200 mL/hr over 60 Minutes Intravenous  Once 02/21/21 1820 02/21/21 1939   02/21/21 1830  metroNIDAZOLE (FLAGYL) IVPB 500 mg        500 mg 100 mL/hr over 60 Minutes Intravenous  Once 02/21/21 1820 02/21/21 2110       Objective: Vitals:   02/23/21 1941 02/24/21 0332 02/24/21 0609 02/24/21 0741  BP: (!) 109/53 132/70  136/72  Pulse: 77 99  99  Resp: $Remo'17 18  18  'pnmpR$ Temp: 98.6 F (37 C) 98.3 F (36.8 C)  97.9 F (36.6 C)  TempSrc:    Oral  SpO2: 96% 95%  97%  Weight:   67.2 kg   Height:        Intake/Output Summary (Last 24 hours) at 02/24/2021 1336 Last data filed at 02/24/2021 0554 Gross per 24 hour  Intake 2852.83 ml  Output 1 ml  Net 2851.83 ml   Filed Weights   02/22/21 0453  02/23/21 0502 02/24/21 0609  Weight: 59 kg 58.4 kg 67.2 kg    Examination: General exam: Appears comfortable  HEENT: PERRLA, oral mucosa moist, no sclera icterus or thrush Respiratory system: Clear to auscultation. Respiratory effort normal. Cardiovascular system: S1 & S2 heard, regular rate and rhythm Gastrointestinal system: Abdomen soft,  More tender in suprapubic area and LLQ, nondistended. Normal bowel sounds   Central nervous system: Alert and oriented. No focal neurological deficits. Extremities: No cyanosis, clubbing or edema Skin: No rashes or ulcers Psychiatry:  Mood & affect appropriate.     Data Reviewed: I have personally reviewed following labs and imaging studies  CBC: Recent Labs  Lab 02/21/21 1543 02/22/21 0147 02/23/21 0816 02/24/21 0552  WBC 17.9* 18.6* 27.7* 29.6*  NEUTROABS  --  16.7*  --   --   HGB 11.0* 11.6* 11.5* 11.8*  HCT 33.1* 34.3* 34.8* 36.2  MCV 89.2 89.6 91.1 92.8  PLT 267 238 223 381   Basic Metabolic Panel: Recent Labs  Lab 02/21/21 1543 02/22/21 0147 02/23/21 0816 02/24/21 0552  NA 136 139 138 136  K 3.7 4.4 3.7 4.0  CL 106 111 114* 114*  CO2 20* 19* 18* 17*  GLUCOSE 158* 154* 148* 120*  BUN 34* 28* 24* 31*  CREATININE 1.40* 1.16* 1.19* 1.50*  CALCIUM 9.0 8.3* 8.2* 8.3*  MG  --  1.8  --   --   PHOS  --  3.5  --   --    GFR: Estimated Creatinine Clearance: 32.9 mL/min (A) (by C-G formula based on SCr of 1.5 mg/dL (H)). Liver Function Tests: Recent Labs  Lab 02/21/21 1543 02/22/21 0147  AST 38 36  ALT 24 23  ALKPHOS 79 73  BILITOT 0.9 1.1  PROT 8.0 7.6  ALBUMIN 3.9 3.6   Recent Labs  Lab 02/21/21 1543  LIPASE 54*   No results for input(s): AMMONIA in the last 168 hours. Coagulation Profile: No results for input(s): INR, PROTIME in the last 168 hours. Cardiac Enzymes: No results for input(s): CKTOTAL, CKMB, CKMBINDEX, TROPONINI in the last 168 hours. BNP (last 3 results) No results for input(s): PROBNP in the  last 8760 hours. HbA1C: No results for input(s): HGBA1C in the last 72 hours. CBG: No results for input(s): GLUCAP in the last 168 hours. Lipid Profile: No results for input(s): CHOL, HDL, LDLCALC, TRIG, CHOLHDL, LDLDIRECT in the last 72 hours. Thyroid Function Tests: Recent Labs    02/22/21 0147  TSH 1.173   Anemia Panel: No results for input(s): VITAMINB12, FOLATE,  FERRITIN, TIBC, IRON, RETICCTPCT in the last 72 hours. Urine analysis:    Component Value Date/Time   COLORURINE YELLOW 02/21/2021 1543   APPEARANCEUR CLOUDY (A) 02/21/2021 1543   LABSPEC 1.020 02/21/2021 1543   PHURINE 7.0 02/21/2021 1543   GLUCOSEU NEGATIVE 02/21/2021 1543   HGBUR LARGE (A) 02/21/2021 1543   BILIRUBINUR NEGATIVE 02/21/2021 1543   KETONESUR NEGATIVE 02/21/2021 1543   PROTEINUR 100 (A) 02/21/2021 1543   NITRITE POSITIVE (A) 02/21/2021 1543   LEUKOCYTESUR LARGE (A) 02/21/2021 1543   Sepsis Labs: $[email or web address]'[id number]$ (procalcitonin:4,lacticidven:4) ) Recent Results (from the past 240 hour(s))  Urine culture     Status: Abnormal   Collection Time: 02/21/21  3:43 PM   Specimen: Urine, Random  Result Value Ref Range Status   Specimen Description   Final    URINE, RANDOM Performed at [facility], [address] [place], [place] [address]    Special Requests   Final    NONE Performed at [facility], [address] [place], [place] [address]    Culture (A)  Final    >=100,000 COLONIES/mL ENTEROCOCCUS FAECIUM >=100,000 COLONIES/mL ENTEROCOCCUS FAECALIS    Report Status 02/24/2021 FINAL  Final   Organism ID, Bacteria ENTEROCOCCUS FAECIUM (A)  Final   Organism ID, Bacteria ENTEROCOCCUS FAECALIS (A)  Final      Susceptibility   Enterococcus faecalis - MIC*    AMPICILLIN <=2 SENSITIVE Sensitive     NITROFURANTOIN <=16 SENSITIVE Sensitive     VANCOMYCIN 2 SENSITIVE Sensitive     * >=100,000 COLONIES/mL ENTEROCOCCUS FAECALIS   Enterococcus faecium - MIC*    AMPICILLIN <=2  SENSITIVE Sensitive     NITROFURANTOIN 64 INTERMEDIATE Intermediate     VANCOMYCIN <=0.5 SENSITIVE Sensitive     * >=100,000 COLONIES/mL ENTEROCOCCUS FAECIUM  Resp Panel by RT-PCR (Flu A&B, Covid) Nasopharyngeal Swab     Status: None   Collection Time: 02/21/21  7:36 PM   Specimen: Nasopharyngeal Swab; Nasopharyngeal(NP) swabs in vial transport medium  Result Value Ref Range Status   SARS Coronavirus 2 by RT PCR NEGATIVE NEGATIVE Final    Comment: (NOTE) SARS-CoV-2 target nucleic acids are NOT DETECTED.  The SARS-CoV-2 RNA is generally detectable in upper respiratory specimens during the acute phase of infection. The lowest concentration of SARS-CoV-2 viral copies this assay can detect is 138 copies/mL. A negative result does not preclude SARS-Cov-2 infection and should not be used as the sole basis for treatment or other patient management decisions. A negative result may occur with  improper specimen collection/handling, submission of specimen other than nasopharyngeal swab, presence of viral mutation(s) within the areas targeted by this assay, and inadequate number of viral copies(<138 copies/mL). A negative result must be combined with clinical observations, patient history, and epidemiological information. The expected result is Negative.  Fact Sheet for Patients:  EntrepreneurPulse.com.au  Fact Sheet for Healthcare Providers:  IncredibleEmployment.be  This test is no t yet approved or cleared by the [place] FDA and  has been authorized for detection and/or diagnosis of SARS-CoV-2 by FDA under an Emergency Use Authorization (EUA). This EUA will remain  in effect (meaning this test can be used) for the duration of the COVID-19 declaration under Section 564(b)(1) of the Act, 21 U.S.C.section 360bbb-3(b)(1), unless the authorization is terminated  or revoked sooner.       Influenza A by PCR NEGATIVE NEGATIVE Final   Influenza B by  PCR NEGATIVE NEGATIVE Final    Comment: (NOTE) The Xpert Xpress SARS-CoV-2/FLU/RSV plus  assay is intended as an aid in the diagnosis of influenza from Nasopharyngeal swab specimens and should not be used as a sole basis for treatment. Nasal washings and aspirates are unacceptable for Xpert Xpress SARS-CoV-2/FLU/RSV testing.  Fact Sheet for Patients: EntrepreneurPulse.com.au  Fact Sheet for Healthcare Providers: IncredibleEmployment.be  This test is not yet approved or cleared by the [place] FDA and has been authorized for detection and/or diagnosis of SARS-CoV-2 by FDA under an Emergency Use Authorization (EUA). This EUA will remain in effect (meaning this test can be used) for the duration of the COVID-19 declaration under Section 564(b)(1) of the Act, 21 U.S.C. section 360bbb-3(b)(1), unless the authorization is terminated or revoked.  Performed at [facility], [address], [place], [place] [address]          Radiology Studies: DG Abd Portable 2V  Result Date: 02/23/2021 CLINICAL DATA:  Abdominal pain, diverticulitis EXAM: PORTABLE ABDOMEN - 2 VIEW COMPARISON:  02/21/2021 CT abdomen/pelvis FINDINGS: Intact visualized lower sternotomy wires. No dilated small bowel loops. Large diffuse colonic stool volume. No evidence pneumatosis or pneumoperitoneum. No radiopaque nephrolithiasis. IMPRESSION: Large diffuse colonic stool volume, suggesting constipation. Nonobstructive bowel gas pattern. No evidence of pneumoperitoneum. Electronically Signed   By: Ilona Sorrel M.D.   On: 02/23/2021 10:38      Scheduled Meds: . atorvastatin  20 mg Oral Daily  . fluticasone  2 spray Each Nare Daily  . levothyroxine  75 mcg Oral Q0600   Continuous Infusions: . sodium chloride 100 mL/hr at 02/24/21 0535  . piperacillin-tazobactam (ZOSYN)  IV 3.375 g (02/24/21 1329)     LOS: 3 days      Debbe Odea, MD [facility] Pager: [email or web address] 02/24/2021, 1:36 PM

## 2021-02-24 NOTE — Progress Notes (Signed)
A consult was placed to the IV Therapist; pt needing a CT scan;  Pt drinking oral contrast and questioned the need for an IV as "I was told 4 years ago that I had stage 3 kidney disease, and not to get iv contrast."  RN came to the room and spoke w Radiology, and RN sent secure chat to MD;  Pt gave consent to "just go ahead and do it"- meaning put in another iv.   New NSL was placed on 1st attempt in [place].

## 2021-02-25 DIAGNOSIS — K5641 Fecal impaction: Secondary | ICD-10-CM | POA: Diagnosis not present

## 2021-02-25 DIAGNOSIS — R7989 Other specified abnormal findings of blood chemistry: Secondary | ICD-10-CM | POA: Diagnosis not present

## 2021-02-25 DIAGNOSIS — N390 Urinary tract infection, site not specified: Secondary | ICD-10-CM | POA: Diagnosis not present

## 2021-02-25 DIAGNOSIS — K5792 Diverticulitis of intestine, part unspecified, without perforation or abscess without bleeding: Secondary | ICD-10-CM | POA: Diagnosis not present

## 2021-02-25 DIAGNOSIS — N322 Vesical fistula, not elsewhere classified: Secondary | ICD-10-CM | POA: Diagnosis not present

## 2021-02-25 DIAGNOSIS — R1032 Left lower quadrant pain: Secondary | ICD-10-CM | POA: Diagnosis not present

## 2021-02-25 LAB — BASIC METABOLIC PANEL
Anion gap: 7 (ref 5–15)
BUN: 35 mg/dL — ABNORMAL HIGH (ref 8–23)
CO2: 16 mmol/L — ABNORMAL LOW (ref 22–32)
Calcium: 7.9 mg/dL — ABNORMAL LOW (ref 8.9–10.3)
Chloride: 110 mmol/L (ref 98–111)
Creatinine, Ser: 1.57 mg/dL — ABNORMAL HIGH (ref 0.44–1.00)
GFR, Estimated: 35 mL/min — ABNORMAL LOW (ref >60)
Glucose, Bld: 123 mg/dL — ABNORMAL HIGH (ref 70–99)
Potassium: 3.5 mmol/L (ref 3.5–5.1)
Sodium: 133 mmol/L — ABNORMAL LOW (ref 135–145)

## 2021-02-25 LAB — CBC
HCT: 32.3 % — ABNORMAL LOW (ref 36.0–46.0)
Hemoglobin: 10.8 g/dL — ABNORMAL LOW (ref 12.0–15.0)
MCH: 29.9 pg (ref 26.0–34.0)
MCHC: 33.4 g/dL (ref 30.0–36.0)
MCV: 89.5 fL (ref 80.0–100.0)
Platelets: 276 10*3/uL (ref 150–400)
RBC: 3.61 MIL/uL — ABNORMAL LOW (ref 3.87–5.11)
RDW: 14.4 % (ref 11.5–15.5)
WBC: 24.5 10*3/uL — ABNORMAL HIGH (ref 4.0–10.5)
nRBC: 0 % (ref 0.0–0.2)

## 2021-02-25 MED ORDER — METOPROLOL SUCCINATE ER 50 MG PO TB24
50.0000 mg | ORAL_TABLET | Freq: Every day | ORAL | Status: DC
  Administered 2021-02-25 – 2021-03-01 (×5): 50 mg via ORAL
  Filled 2021-02-25 (×5): qty 1

## 2021-02-25 MED ORDER — POLYETHYLENE GLYCOL 3350 17 GM/SCOOP PO POWD
1.0000 | Freq: Once | ORAL | Status: AC
  Administered 2021-02-25: 255 g via ORAL
  Filled 2021-02-25: qty 255

## 2021-02-25 MED ORDER — HYDROMORPHONE HCL 1 MG/ML IJ SOLN
0.5000 mg | INTRAMUSCULAR | Status: DC | PRN
  Administered 2021-02-25 – 2021-04-02 (×34): 0.5 mg via INTRAVENOUS
  Filled 2021-02-25: qty 0.5
  Filled 2021-02-25 (×3): qty 1
  Filled 2021-02-25: qty 0.5
  Filled 2021-02-25 (×9): qty 1
  Filled 2021-02-25: qty 0.5
  Filled 2021-02-25 (×9): qty 1
  Filled 2021-02-25: qty 0.5
  Filled 2021-02-25 (×5): qty 1
  Filled 2021-02-25: qty 0.5
  Filled 2021-02-25 (×3): qty 1

## 2021-02-25 MED ORDER — METHYLNALTREXONE BROMIDE 12 MG/0.6ML ~~LOC~~ SOLN
6.0000 mg | Freq: Once | SUBCUTANEOUS | Status: AC
  Administered 2021-02-25: 6 mg via SUBCUTANEOUS
  Filled 2021-02-25: qty 0.6

## 2021-02-25 MED ORDER — HYDROCODONE-ACETAMINOPHEN 5-325 MG PO TABS
1.0000 | ORAL_TABLET | ORAL | Status: DC | PRN
Start: 2021-02-25 — End: 2021-03-02
  Administered 2021-02-28 – 2021-03-01 (×2): 1 via ORAL
  Filled 2021-02-25 (×2): qty 1

## 2021-02-25 MED ORDER — GABAPENTIN 300 MG PO CAPS
300.0000 mg | ORAL_CAPSULE | Freq: Three times a day (TID) | ORAL | Status: DC
  Administered 2021-02-25 – 2021-02-26 (×4): 300 mg via ORAL
  Filled 2021-02-25 (×6): qty 1

## 2021-02-25 MED ORDER — LEVOTHYROXINE SODIUM 50 MCG PO TABS: 75.0000 ug | ORAL_TABLET | Freq: Every day | ORAL | Status: DC

## 2021-02-25 NOTE — Progress Notes (Signed)
PROGRESS NOTE    Desiree Madden   VWU:[id number]  DOB: [birth year]  DOA: 02/21/2021 PCP: [facility]   Brief Narrative:  Desiree Madden is a 70 year old female with ulcerative colitis, diverticulosis who has a colovesical fistula.  She also has a history of hypothyroidism and hyperlipidemia. She presented to the hospital with severe abdominal pain and burning micturition.  She also vomiting 3 times on the day of admission.  She has been on and off antibiotics over the past few months for recurrent urinary tract infections related to the fistula.  She states she was supposed to have some sort of surgery for the fistula this past fall, however, it did not occur for some reason.  She is now waiting to see a gastroenterologist at the [facility] clinic on April 1st.  CT scan in the ED reveals possible diverticulitis and also a large amount of stool in the left colon.  She was started on ciprofloxacin and Flagyl.   Subjective: She has has a liquid BM this AM. Has barely been able to drink the large bottle of Miralax that was brought to her last night. Abdominal pain is improving.      Assessment & Plan:   Principal Problem:   Diverticulitis, UTI, vomiting, leukocytosis  Colovesical fistula in setting of Ulcerative colitis  - not on DMARDS or steroids - she has an appt for a GI eval next month-  - vomiting likely related to infection + stool burden -  urine culture> e faecium- f/u sensitivities - on Cipro and Flagyl IV but WBC rising concerning for worsening infection and uncovered organism -  switched to Zosyn 3/18 - WBC higher today to 29.6 & more tender as well- > CT abd/pelvis STAT> showing increased inflammation- cont Zosyn- WBC improving today - General surgery consulted- considering colostomy which discussed with pateint  Active Problems:   Elevated lactic acid level - due to above- improved from 3.0 to 2.1  Severe Constipation -No results after sorbitol- Trying Mirlax  bowel prep- received contrast for CT yesterday which is also a laxative - Will try Relistor and cut back on Dilaudid as her Diverticulitis is improving now    Hyperlipidemia - Lipitor    Essential hypertension - hold Lopressor- follow BP    Hypothyroidism - cont Synthroid    AKI - Cr 1.40 has improved to 1.16- cont IVF as it is likely pre-renal  Normocytic anemia - follow   Time spent in minutes: 35 DVT prophylaxis: SCDs Start: 02/21/21 2200 Code Status: Full code Family Communication:  Level of Care: Level of care: Progressive Cardiac Disposition Plan:  Status is: Inpatient Remains inpatient appropriate because:IV treatments appropriate due to intensity of illness or inability to take PO Dispo: The patient is from: Home              Anticipated d/c is to: Home              Patient currently is not medically stable to d/c.   Difficult to place patient No   Consultants:   GI  General surgery Procedures:   none Antimicrobials:  Anti-infectives (From admission, onward)   Start     Dose/Rate Route Frequency Ordered Stop   02/23/21 1230  piperacillin-tazobactam (ZOSYN) IVPB 3.375 g        3.375 g 12.5 mL/hr over 240 Minutes Intravenous Every 8 hours 02/23/21 1143     02/22/21 0600  ciprofloxacin (CIPRO) IVPB 400 mg  Status:  Discontinued  400 mg 200 mL/hr over 60 Minutes Intravenous Every 12 hours 02/21/21 2028 02/23/21 1143   02/22/21 0400  metroNIDAZOLE (FLAGYL) IVPB 500 mg  Status:  Discontinued        500 mg 100 mL/hr over 60 Minutes Intravenous Every 8 hours 02/21/21 2028 02/23/21 1143   02/21/21 1830  ciprofloxacin (CIPRO) IVPB 400 mg        400 mg 200 mL/hr over 60 Minutes Intravenous  Once 02/21/21 1820 02/21/21 1939   02/21/21 1830  metroNIDAZOLE (FLAGYL) IVPB 500 mg        500 mg 100 mL/hr over 60 Minutes Intravenous  Once 02/21/21 1820 02/21/21 2110       Objective: Vitals:   02/24/21 2014 02/25/21 0500 02/25/21 0841 02/25/21 1349  BP:  128/66 (!) 157/82 (!) 148/81   Pulse: (!) 107 (!) 115 (!) 116   Resp: $Remo'20 18 17 20  'iEIND$ Temp: 99.5 F (37.5 C) 99.8 F (37.7 C) (!) 97.5 F (36.4 C)   TempSrc: Oral Axillary Oral   SpO2: 95% 98% 97%   Weight:      Height:        Intake/Output Summary (Last 24 hours) at 02/25/2021 1411 Last data filed at 02/25/2021 0841 Gross per 24 hour  Intake --  Output 400 ml  Net -400 ml   Filed Weights   02/22/21 0453 02/23/21 0502 02/24/21 0609  Weight: 59 kg 58.4 kg 67.2 kg    Examination: General exam: Appears comfortable  HEENT: PERRLA, oral mucosa moist, no sclera icterus or thrush Respiratory system: Clear to auscultation. Respiratory effort normal. Cardiovascular system: S1 & S2 heard, regular rate and rhythm Gastrointestinal system: Abdomen soft,  Mildly tender in LLQ today and in suprapubic area, mildly distended. Normal bowel sounds   Central nervous system: Alert and oriented. No focal neurological deficits. Extremities: No cyanosis, clubbing or edema Skin: No rashes or ulcers Psychiatry:  Mood & affect appropriate.    Data Reviewed: I have personally reviewed following labs and imaging studies  CBC: Recent Labs  Lab 02/21/21 1543 02/22/21 0147 02/23/21 0816 02/24/21 0552 02/25/21 0803  WBC 17.9* 18.6* 27.7* 29.6* 24.5*  NEUTROABS  --  16.7*  --   --   --   HGB 11.0* 11.6* 11.5* 11.8* 10.8*  HCT 33.1* 34.3* 34.8* 36.2 32.3*  MCV 89.2 89.6 91.1 92.8 89.5  PLT 267 238 223 240 341   Basic Metabolic Panel: Recent Labs  Lab 02/21/21 1543 02/22/21 0147 02/23/21 0816 02/24/21 0552 02/25/21 0803  NA 136 139 138 136 133*  K 3.7 4.4 3.7 4.0 3.5  CL 106 111 114* 114* 110  CO2 20* 19* 18* 17* 16*  GLUCOSE 158* 154* 148* 120* 123*  BUN 34* 28* 24* 31* 35*  CREATININE 1.40* 1.16* 1.19* 1.50* 1.57*  CALCIUM 9.0 8.3* 8.2* 8.3* 7.9*  MG  --  1.8  --   --   --   PHOS  --  3.5  --   --   --    GFR: Estimated Creatinine Clearance: 31.4 mL/min (A) (by C-G formula based on  SCr of 1.57 mg/dL (H)). Liver Function Tests: Recent Labs  Lab 02/21/21 1543 02/22/21 0147  AST 38 36  ALT 24 23  ALKPHOS 79 73  BILITOT 0.9 1.1  PROT 8.0 7.6  ALBUMIN 3.9 3.6   Recent Labs  Lab 02/21/21 1543  LIPASE 54*   No results for input(s): AMMONIA in the last 168 hours. Coagulation Profile: No results for  input(s): INR, PROTIME in the last 168 hours. Cardiac Enzymes: No results for input(s): CKTOTAL, CKMB, CKMBINDEX, TROPONINI in the last 168 hours. BNP (last 3 results) No results for input(s): PROBNP in the last 8760 hours. HbA1C: No results for input(s): HGBA1C in the last 72 hours. CBG: No results for input(s): GLUCAP in the last 168 hours. Lipid Profile: No results for input(s): CHOL, HDL, LDLCALC, TRIG, CHOLHDL, LDLDIRECT in the last 72 hours. Thyroid Function Tests: No results for input(s): TSH, T4TOTAL, FREET4, T3FREE, THYROIDAB in the last 72 hours. Anemia Panel: No results for input(s): VITAMINB12, FOLATE, FERRITIN, TIBC, IRON, RETICCTPCT in the last 72 hours. Urine analysis:    Component Value Date/Time   COLORURINE YELLOW 02/21/2021 1543   APPEARANCEUR CLOUDY (A) 02/21/2021 1543   LABSPEC 1.020 02/21/2021 1543   PHURINE 7.0 02/21/2021 1543   GLUCOSEU NEGATIVE 02/21/2021 1543   HGBUR LARGE (A) 02/21/2021 1543   BILIRUBINUR NEGATIVE 02/21/2021 1543   KETONESUR NEGATIVE 02/21/2021 1543   PROTEINUR 100 (A) 02/21/2021 1543   NITRITE POSITIVE (A) 02/21/2021 1543   LEUKOCYTESUR LARGE (A) 02/21/2021 1543   Sepsis Labs: $RemoveBefo'@[email or web address]'[id number]$ (procalcitonin:4,lacticidven:4) ) Recent Results (from the past 240 hour(s))  Urine culture     Status: Abnormal   Collection Time: 02/21/21  3:43 PM   Specimen: Urine, Random  Result Value Ref Range Status   Specimen Description   Final    URINE, RANDOM Performed at [facility], [address] [place], [place] [address]    Special Requests   Final    NONE Performed at [facility], [address] [place], [place] [address]    Culture (A)  Final    >=100,000 COLONIES/mL ENTEROCOCCUS FAECIUM >=100,000 COLONIES/mL ENTEROCOCCUS FAECALIS    Report Status 02/24/2021 FINAL  Final   Organism ID, Bacteria ENTEROCOCCUS FAECIUM (A)  Final   Organism ID, Bacteria ENTEROCOCCUS FAECALIS (A)  Final      Susceptibility   Enterococcus faecalis - MIC*    AMPICILLIN <=2 SENSITIVE Sensitive     NITROFURANTOIN <=16 SENSITIVE Sensitive     VANCOMYCIN 2 SENSITIVE Sensitive     * >=100,000 COLONIES/mL ENTEROCOCCUS FAECALIS   Enterococcus faecium - MIC*    AMPICILLIN <=2 SENSITIVE Sensitive     NITROFURANTOIN 64 INTERMEDIATE Intermediate     VANCOMYCIN <=0.5 SENSITIVE Sensitive     * >=100,000 COLONIES/mL ENTEROCOCCUS FAECIUM  Resp Panel by RT-PCR (Flu A&B, Covid) Nasopharyngeal Swab     Status: None   Collection Time: 02/21/21  7:36 PM   Specimen: Nasopharyngeal Swab; Nasopharyngeal(NP) swabs in vial transport medium  Result Value Ref Range Status   SARS Coronavirus 2 by RT PCR NEGATIVE NEGATIVE Final    Comment: (NOTE) SARS-CoV-2 target nucleic acids are NOT DETECTED.  The SARS-CoV-2 RNA is generally detectable in upper respiratory specimens during the acute phase of infection. The lowest concentration of SARS-CoV-2 viral copies this assay can detect is 138 copies/mL. A negative result does not preclude SARS-Cov-2 infection and should not be used as the sole basis for treatment or other patient management decisions. A negative result may occur with  improper specimen collection/handling, submission of specimen other than nasopharyngeal swab, presence of viral mutation(s) within the areas targeted by this assay, and inadequate number of viral copies(<138 copies/mL). A negative result must be combined with clinical observations, patient history, and epidemiological information. The expected result is Negative.  Fact Sheet for Patients:  EntrepreneurPulse.com.au  Fact  Sheet for Healthcare Providers:  IncredibleEmployment.be  This test  is no t yet approved or cleared by the [place] and  has been authorized for detection and/or diagnosis of SARS-CoV-2 by FDA under an Emergency Use Authorization (EUA). This EUA will remain  in effect (meaning this test can be used) for the duration of the COVID-19 declaration under Section 564(b)(1) of the Act, 21 U.S.C.section 360bbb-3(b)(1), unless the authorization is terminated  or revoked sooner.       Influenza A by PCR NEGATIVE NEGATIVE Final   Influenza B by PCR NEGATIVE NEGATIVE Final    Comment: (NOTE) The Xpert Xpress SARS-CoV-2/FLU/RSV plus assay is intended as an aid in the diagnosis of influenza from Nasopharyngeal swab specimens and should not be used as a sole basis for treatment. Nasal washings and aspirates are unacceptable for Xpert Xpress SARS-CoV-2/FLU/RSV testing.  Fact Sheet for Patients: EntrepreneurPulse.com.au  Fact Sheet for Healthcare Providers: IncredibleEmployment.be  This test is not yet approved or cleared by the [place] FDA and has been authorized for detection and/or diagnosis of SARS-CoV-2 by FDA under an Emergency Use Authorization (EUA). This EUA will remain in effect (meaning this test can be used) for the duration of the COVID-19 declaration under Section 564(b)(1) of the Act, 21 U.S.C. section 360bbb-3(b)(1), unless the authorization is terminated or revoked.  Performed at [facility], [address] [place], [place] [address]          Radiology Studies: CT ABDOMEN PELVIS W CONTRAST  Result Date: 02/24/2021 CLINICAL DATA:  Lower abdominal and pelvic pain EXAM: CT ABDOMEN AND PELVIS WITH CONTRAST TECHNIQUE: Multidetector CT imaging of the abdomen and pelvis was performed using the standard protocol following bolus administration of intravenous contrast. CONTRAST:  62mL OMNIPAQUE  IOHEXOL 300 MG/ML  SOLN COMPARISON:  02/21/2021 FINDINGS: Lower chest: New atelectasis in the lower lobes and lingula. Trace bilateral pleural effusions are likewise new. Prior CABG. Coronary and aortic atherosclerosis. Contrast medium in the distal esophagus suggest dysmotility or reflux. Hepatobiliary: Borderline distended gallbladder. No biliary dilatation. No focal hepatic parenchymal lesion identified. Pancreas: Unremarkable Spleen: Unremarkable Adrenals/Urinary Tract: Both adrenal glands appear within normal limits. Borderline right hydroureter extending to the iliac vessel cross over with the ureters obscured by surrounding inflammatory findings. No hydronephrosis or delay in excretion of contrast medium. Small amount of left lateral perinephric edema. Stomach/Bowel: Normal appendix. Descending and sigmoid colon diverticulosis with notably prominent caliber of the sigmoid colon, considerable paracolic stranding around the distal descending and sigmoid colon, and substantial localized wall thickening along a 9 cm segment of the distal sigmoid colon for example on image 70 of series 2 suspicious for progressive diverticulitis and likely some superimposed regional colitis. Air-fluid level in the rectum. Peripheral intermediate density in the cecum for example on image 51 of series 2 simulates wall thickening, but there is no wall thickening in this region on 02/21/2021 hence this appearance is due to isodense fluid rather than actual cecal wall thickening. Vascular/Lymphatic: Aortoiliac atherosclerotic vascular disease. Scattered likely reactive retroperitoneal lymph nodes including an index left periaortic node measuring 0.8 cm in short axis on image 48 series 2. Reproductive: Unremarkable Other: New perihepatic and perisplenic ascites along with fluid tracking along the paracolic gutter. New pelvic ascites. No free intraperitoneal gas is identified. I do not observe a discrete drainable abscess.  Musculoskeletal: Unchanged compression fracture at T11. Lower lumbar spondylosis and degenerative disc disease. IMPRESSION: 1. Worsened prominent paracolic stranding around the distal descending and sigmoid colon, and substantial localized wall thickening along a 9 cm segment of  the distal sigmoid colon suspicious for progressive diverticulitis and likely some superimposed regional colitis. No free intraperitoneal gas or discrete drainable abscess identified. 2. New perihepatic and perisplenic ascites along with fluid tracking along the paracolic gutters and pelvic ascites. 3. Trace bilateral pleural effusions with new atelectasis in the lower lobes and lingula. 4. Contrast medium in the distal esophagus suggest dysmotility or reflux. 5. Unchanged compression fracture at T11. 6. Borderline distended gallbladder. 7. Borderline right hydroureter extending to the iliac vessel cross over with the ureters obscured by surrounding inflammatory findings. No hydronephrosis or delay in excretion of contrast medium. 8. Aortic atherosclerosis. Aortic Atherosclerosis (ICD10-I70.0). Electronically Signed   By: Van Clines M.D.   On: 02/24/2021 13:58      Scheduled Meds: . atorvastatin  20 mg Oral Daily  . fluticasone  2 spray Each Nare Daily  . gabapentin  300 mg Oral TID  . levothyroxine  75 mcg Oral Q0600  . metoprolol succinate  50 mg Oral Daily   Continuous Infusions: . sodium chloride 100 mL/hr at 02/25/21 0718  . piperacillin-tazobactam (ZOSYN)  IV 3.375 g (02/25/21 0506)     LOS: 4 days      Debbe Odea, MD [facility] Pager: [email or web address] 02/25/2021, 2:11 PM

## 2021-02-25 NOTE — Progress Notes (Signed)
   02/25/21 0500  Assess: MEWS Score  Temp 99.8 F (37.7 C)  BP (!) 157/82  Pulse Rate (!) 115  Resp 18  Level of Consciousness Alert  SpO2 98 %  O2 Device Room Air  Assess: MEWS Score  MEWS Temp 0  MEWS Systolic 0  MEWS Pulse 2  MEWS RR 0  MEWS LOC 0  MEWS Score 2  MEWS Score Color Yellow

## 2021-02-25 NOTE — Progress Notes (Signed)
[facility] SURGICAL PROGRESS NOTE (cpt [phone])  Hospital Day(s): 4.   Post op day(s):  Marland Kitchen   Interval History: Patient seen and examined, no acute events or new complaints overnight. Patient reports having consumed only a small fraction of the MiraLAX Gatorade mixture having only received it this morning, denies worsening pain, reports small progress with liquid movement.  She appears grossly more uncomfortable, but is not interested in any change of plan.  She seems motivated to continue with the oral bowel regimen, but also seems to be encouraged by the potential enema tomorrow.  Review of Systems:  Constitutional: denies fever, chills  HEENT: denies cough or congestion  Respiratory: denies any shortness of breath  Cardiovascular: denies chest pain or palpitations  Musculoskeletal: denies pain, decreased motor or sensation Integumentary: denies any other rashes or skin discolorations Neurological: denies HA or vision/hearing changes   Vital signs in last 24 hours: [min-max] current  Temp:  [97.5 F (36.4 C)-99.8 F (37.7 C)] 97.5 F (36.4 C) (03/20 0841) Pulse Rate:  [107-116] 116 (03/20 0841) Resp:  [17-20] 20 (03/20 1349) BP: (128-157)/(66-82) 148/81 (03/20 0841) SpO2:  [95 %-98 %] 97 % (03/20 0841)     Height: $Remove'5\' 4"'gRemfmV$  (162.6 cm) Weight: 67.2 kg BMI (Calculated): 25.41   Intake/Output last 2 shifts:  03/19 0701 - 03/20 0700 In: -  Out: 500 [Urine:500]   Physical Exam:  Constitutional: alert, cooperative and no distress  HENT: normocephalic without obvious abnormality  Eyes: PERRL, EOM's grossly intact and symmetric  Neuro: CN II - XII grossly intact and symmetric without deficit  Respiratory: breathing non-labored at rest  Cardiovascular: Tachycardic rate Gastrointestinal: Palpable tender mass along the lower abdomen persists.  Otherwise soft, non-tender, and non-distended Musculoskeletal: no appreciable wounds, motor and sensation grossly intact, NT     Labs:  CBC Latest Ref Rng & Units 02/25/2021 02/24/2021 02/23/2021  WBC 4.0 - 10.5 K/uL 24.5(H) 29.6(H) 27.7(H)  Hemoglobin 12.0 - 15.0 g/dL 10.8(L) 11.8(L) 11.5(L)  Hematocrit 36.0 - 46.0 % 32.3(L) 36.2 34.8(L)  Platelets 150 - 400 K/uL 276 240 223   CMP Latest Ref Rng & Units 02/25/2021 02/24/2021 02/23/2021  Glucose 70 - 99 mg/dL 123(H) 120(H) 148(H)  BUN 8 - 23 mg/dL 35(H) 31(H) 24(H)  Creatinine 0.44 - 1.00 mg/dL 1.57(H) 1.50(H) 1.19(H)  Sodium 135 - 145 mmol/L 133(L) 136 138  Potassium 3.5 - 5.1 mmol/L 3.5 4.0 3.7  Chloride 98 - 111 mmol/L 110 114(H) 114(H)  CO2 22 - 32 mmol/L 16(L) 17(L) 18(L)  Calcium 8.9 - 10.3 mg/dL 7.9(L) 8.3(L) 8.2(L)  Total Protein 6.5 - 8.1 g/dL - - -  Total Bilirubin 0.3 - 1.2 mg/dL - - -  Alkaline Phos 38 - 126 U/L - - -  AST 15 - 41 U/L - - -  ALT 0 - 44 U/L - - -     Imaging studies: No new pertinent imaging studies   Assessment/Plan:  70 y.o. female with what I believe is a high impaction complicating acute diverticulitis, possibly functional rectosigmoid obstruction, complicated by pertinent comorbidities including:.   Patient Active Problem List   Diagnosis Date Noted  . Diverticulitis 02/21/2021  . Fistula, bladder 02/21/2021  . Ulcerative colitis ([place]) 02/21/2021  . Hyperlipidemia 02/21/2021  . Essential hypertension 02/21/2021  . Hypothyroidism 02/21/2021  . Abnormal ECG 02/21/2021  . CKD (chronic kidney disease), stage III ([place]) 02/21/2021  . Elevated lactic acid level 02/21/2021     -I am a bit disappointed at her  progress with her oral bowel regimen, but I can certainly understand the challenges of tolerating it.  Considering her current state.  -Although there are some inherent risks associated with a diagnostic/therapeutic water-soluble/Gastrografin enema.  This appears to be where she is placing her hope.  -Still refusing surgery at this time.  -I will proceed with ordering the enema with the assistance of radiology.   Should these attempts worsen her diverticulitis, or not alleviate her condition, I believe the next best option is to proceed with surgery.  All of the above findings and recommendations were discussed with the patient.   -- Ronny Bacon M.D., [facility] 02/25/2021 5:23 PM

## 2021-02-26 ENCOUNTER — Inpatient Hospital Stay: Payer: Medicare HMO

## 2021-02-26 DIAGNOSIS — K5792 Diverticulitis of intestine, part unspecified, without perforation or abscess without bleeding: Secondary | ICD-10-CM | POA: Diagnosis not present

## 2021-02-26 DIAGNOSIS — N322 Vesical fistula, not elsewhere classified: Secondary | ICD-10-CM | POA: Diagnosis not present

## 2021-02-26 LAB — CBC
HCT: 32 % — ABNORMAL LOW (ref 36.0–46.0)
Hemoglobin: 10.9 g/dL — ABNORMAL LOW (ref 12.0–15.0)
MCH: 30.1 pg (ref 26.0–34.0)
MCHC: 34.1 g/dL (ref 30.0–36.0)
MCV: 88.4 fL (ref 80.0–100.0)
Platelets: 289 10*3/uL (ref 150–400)
RBC: 3.62 MIL/uL — ABNORMAL LOW (ref 3.87–5.11)
RDW: 14.5 % (ref 11.5–15.5)
WBC: 22 10*3/uL — ABNORMAL HIGH (ref 4.0–10.5)
nRBC: 0.1 % (ref 0.0–0.2)

## 2021-02-26 NOTE — Progress Notes (Signed)
PROGRESS NOTE    Desiree Madden   QQI:[id number]  DOB: April 02, 1951  DOA: 02/21/2021 PCP: [facility]   Brief Narrative:  Desiree Madden is a 70 year old female with ulcerative colitis, diverticulosis who has a colovesical fistula.  She also has a history of hypothyroidism and hyperlipidemia. She presented to the hospital with severe abdominal pain and burning micturition.  She also vomiting 3 times on the day of admission.  She has been on and off antibiotics over the past few months for recurrent urinary tract infections related to the fistula.  She states she was supposed to have some sort of surgery for the fistula this past fall, however, it did not occur for some reason.  She is now waiting to see a gastroenterologist at the [facility] clinic on April 1st.  CT scan in the ED reveals possible diverticulitis and also a large amount of stool in the left colon.  She was started on ciprofloxacin and Flagyl.   Subjective: She had multiple liquid bowel movements overnight.    Assessment & Plan:   Principal Problem:   Diverticulitis, UTI, vomiting, leukocytosis  Colovesical fistula in setting of Ulcerative colitis  - not on DMARDS or steroids - she has an appt for a GI eval next month-  - vomiting likely related to infection + stool burden -  urine culture> e faecium and E faecalis-sensitive to ampicillin - 3/18> on Cipro and Flagyl IV but WBC rising concerning for worsening infection and uncovered organism -  switched to Zosyn   - 3/19 CT abd/pelvis > showing increased inflammation - WBC improving daily now after switching to Zosyn - plan for minimum 7 full days of Zosyn    - General surgery consulted- considering colostomy as outpatient which was discussed with patient - gastrografin enema today- could not hold it  Active Problems:   Elevated lactic acid level - due to above- improved from 3.0 to 2.1  Severe Constipation  - given Sorbitol, Dulcolax suppository,  Relistor and currently on Miralax bowel prep - beginning to have BMs after Relistor- I have cut back on narcotics as her pain and diverticulitis is improving.    Hyperlipidemia - Lipitor    Essential hypertension -  Cont Toprol  Episodes of non sustained SVT and V tach - occurring while Toprol was on hold- It has been resumed.    Hypothyroidism - cont Synthroid    AKI - Cr 1.40 has improved to 1.16 with IVF - stop IVF today  Normocytic anemia - follow   Time spent in minutes: 35 DVT prophylaxis: SCDs Start: 02/21/21 2200 Code Status: Full code Family Communication:  Level of Care: Level of care: Progressive Cardiac Disposition Plan:  Status is: Inpatient Remains inpatient appropriate because:IV treatments appropriate due to intensity of illness or inability to take PO Dispo: The patient is from: Home              Anticipated d/c is to: Home              Patient currently is not medically stable to d/c.   Difficult to place patient No   Consultants:   GI  General surgery Procedures:   none Antimicrobials:  Anti-infectives (From admission, onward)   Start     Dose/Rate Route Frequency Ordered Stop   02/23/21 1230  piperacillin-tazobactam (ZOSYN) IVPB 3.375 g        3.375 g 12.5 mL/hr over 240 Minutes Intravenous Every 8 hours 02/23/21 1143     02/22/21 0600  ciprofloxacin (CIPRO) IVPB 400 mg  Status:  Discontinued        400 mg 200 mL/hr over 60 Minutes Intravenous Every 12 hours 02/21/21 2028 02/23/21 1143   02/22/21 0400  metroNIDAZOLE (FLAGYL) IVPB 500 mg  Status:  Discontinued        500 mg 100 mL/hr over 60 Minutes Intravenous Every 8 hours 02/21/21 2028 02/23/21 1143   02/21/21 1830  ciprofloxacin (CIPRO) IVPB 400 mg        400 mg 200 mL/hr over 60 Minutes Intravenous  Once 02/21/21 1820 02/21/21 1939   02/21/21 1830  metroNIDAZOLE (FLAGYL) IVPB 500 mg        500 mg 100 mL/hr over 60 Minutes Intravenous  Once 02/21/21 1820 02/21/21 2110        Objective: Vitals:   02/25/21 2126 02/26/21 0637 02/26/21 0638 02/26/21 0747  BP: (!) 154/71 137/81 (!) 145/82 122/60  Pulse: 82 100 100 (!) 102  Resp:    18  Temp: 97.9 F (36.6 C)   (!) 97.5 F (36.4 C)  TempSrc: Oral   Oral  SpO2: 99%   95%  Weight:      Height:        Intake/Output Summary (Last 24 hours) at 02/26/2021 0836 Last data filed at 02/26/2021 0747 Gross per 24 hour  Intake 3778.61 ml  Output 1050 ml  Net 2728.61 ml   Filed Weights   02/22/21 0453 02/23/21 0502 02/24/21 0609  Weight: 59 kg 58.4 kg 67.2 kg    Examination: General exam: Appears comfortable  HEENT: PERRLA, oral mucosa moist, no sclera icterus or thrush Respiratory system: Clear to auscultation. Respiratory effort normal. Cardiovascular system: S1 & S2 heard, regular rate and rhythm Gastrointestinal system: Abdomen soft,  tender in LLQ, nondistended. Normal bowel sounds   Central nervous system: Alert and oriented. No focal neurological deficits. Extremities: No cyanosis, clubbing or edema Skin: No rashes or ulcers Psychiatry:  Mood & affect appropriate.   Data Reviewed: I have personally reviewed following labs and imaging studies  CBC: Recent Labs  Lab 02/22/21 0147 02/23/21 0816 02/24/21 0552 02/25/21 0803 02/26/21 0823  WBC 18.6* 27.7* 29.6* 24.5* 22.0*  NEUTROABS 16.7*  --   --   --   --   HGB 11.6* 11.5* 11.8* 10.8* 10.9*  HCT 34.3* 34.8* 36.2 32.3* 32.0*  MCV 89.6 91.1 92.8 89.5 88.4  PLT 238 223 240 276 697   Basic Metabolic Panel: Recent Labs  Lab 02/21/21 1543 02/22/21 0147 02/23/21 0816 02/24/21 0552 02/25/21 0803  NA 136 139 138 136 133*  K 3.7 4.4 3.7 4.0 3.5  CL 106 111 114* 114* 110  CO2 20* 19* 18* 17* 16*  GLUCOSE 158* 154* 148* 120* 123*  BUN 34* 28* 24* 31* 35*  CREATININE 1.40* 1.16* 1.19* 1.50* 1.57*  CALCIUM 9.0 8.3* 8.2* 8.3* 7.9*  MG  --  1.8  --   --   --   PHOS  --  3.5  --   --   --    GFR: Estimated Creatinine Clearance: 31.4 mL/min  (A) (by C-G formula based on SCr of 1.57 mg/dL (H)). Liver Function Tests: Recent Labs  Lab 02/21/21 1543 02/22/21 0147  AST 38 36  ALT 24 23  ALKPHOS 79 73  BILITOT 0.9 1.1  PROT 8.0 7.6  ALBUMIN 3.9 3.6   Recent Labs  Lab 02/21/21 1543  LIPASE 54*   No results for input(s): AMMONIA in the last 168 hours. Coagulation  Profile: No results for input(s): INR, PROTIME in the last 168 hours. Cardiac Enzymes: No results for input(s): CKTOTAL, CKMB, CKMBINDEX, TROPONINI in the last 168 hours. BNP (last 3 results) No results for input(s): PROBNP in the last 8760 hours. HbA1C: No results for input(s): HGBA1C in the last 72 hours. CBG: No results for input(s): GLUCAP in the last 168 hours. Lipid Profile: No results for input(s): CHOL, HDL, LDLCALC, TRIG, CHOLHDL, LDLDIRECT in the last 72 hours. Thyroid Function Tests: No results for input(s): TSH, T4TOTAL, FREET4, T3FREE, THYROIDAB in the last 72 hours. Anemia Panel: No results for input(s): VITAMINB12, FOLATE, FERRITIN, TIBC, IRON, RETICCTPCT in the last 72 hours. Urine analysis:    Component Value Date/Time   COLORURINE YELLOW 02/21/2021 1543   APPEARANCEUR CLOUDY (A) 02/21/2021 1543   LABSPEC 1.020 02/21/2021 1543   PHURINE 7.0 02/21/2021 1543   GLUCOSEU NEGATIVE 02/21/2021 1543   HGBUR LARGE (A) 02/21/2021 1543   BILIRUBINUR NEGATIVE 02/21/2021 1543   KETONESUR NEGATIVE 02/21/2021 1543   PROTEINUR 100 (A) 02/21/2021 1543   NITRITE POSITIVE (A) 02/21/2021 1543   LEUKOCYTESUR LARGE (A) 02/21/2021 1543   Sepsis Labs: $RemoveBefo'@[id number]$ (procalcitonin:4,lacticidven:4) ) Recent Results (from the past 240 hour(s))  Urine culture     Status: Abnormal   Collection Time: 02/21/21  3:43 PM   Specimen: Urine, Random  Result Value Ref Range Status   Specimen Description   Final    URINE, RANDOM Performed at [facility], [address] [place], [place] [address]    Special Requests   Final    NONE Performed at [facility], [address] [place], [place] [address]    Culture (A)  Final    >=100,000 COLONIES/mL ENTEROCOCCUS FAECIUM >=100,000 COLONIES/mL ENTEROCOCCUS FAECALIS    Report Status 02/24/2021 FINAL  Final   Organism ID, Bacteria ENTEROCOCCUS FAECIUM (A)  Final   Organism ID, Bacteria ENTEROCOCCUS FAECALIS (A)  Final      Susceptibility   Enterococcus faecalis - MIC*    AMPICILLIN <=2 SENSITIVE Sensitive     NITROFURANTOIN <=16 SENSITIVE Sensitive     VANCOMYCIN 2 SENSITIVE Sensitive     * >=100,000 COLONIES/mL ENTEROCOCCUS FAECALIS   Enterococcus faecium - MIC*    AMPICILLIN <=2 SENSITIVE Sensitive     NITROFURANTOIN 64 INTERMEDIATE Intermediate     VANCOMYCIN <=0.5 SENSITIVE Sensitive     * >=100,000 COLONIES/mL ENTEROCOCCUS FAECIUM  Resp Panel by RT-PCR (Flu A&B, Covid) Nasopharyngeal Swab     Status: None   Collection Time: 02/21/21  7:36 PM   Specimen: Nasopharyngeal Swab; Nasopharyngeal(NP) swabs in vial transport medium  Result Value Ref Range Status   SARS Coronavirus 2 by RT PCR NEGATIVE NEGATIVE Final    Comment: (NOTE) SARS-CoV-2 target nucleic acids are NOT DETECTED.  The SARS-CoV-2 RNA is generally detectable in upper respiratory specimens during the acute phase of infection. The lowest concentration of SARS-CoV-2 viral copies this assay can detect is 138 copies/mL. A negative result does not preclude SARS-Cov-2 infection and should not be used as the sole basis for treatment or other patient management decisions. A negative result may occur with  improper specimen collection/handling, submission of specimen other than nasopharyngeal swab, presence of viral mutation(s) within the areas targeted by this assay, and inadequate number of viral copies(<138 copies/mL). A negative result must be combined with clinical observations, patient history, and epidemiological information. The expected result is Negative.  Fact Sheet for Patients:   EntrepreneurPulse.com.au  Fact Sheet for Healthcare Providers:  IncredibleEmployment.be  This test is no t yet approved or cleared by the [place] and  has been authorized for detection and/or diagnosis of SARS-CoV-2 by FDA under an Emergency Use Authorization (EUA). This EUA will remain  in effect (meaning this test can be used) for the duration of the COVID-19 declaration under Section 564(b)(1) of the Act, 21 U.S.C.section 360bbb-3(b)(1), unless the authorization is terminated  or revoked sooner.       Influenza A by PCR NEGATIVE NEGATIVE Final   Influenza B by PCR NEGATIVE NEGATIVE Final    Comment: (NOTE) The Xpert Xpress SARS-CoV-2/FLU/RSV plus assay is intended as an aid in the diagnosis of influenza from Nasopharyngeal swab specimens and should not be used as a sole basis for treatment. Nasal washings and aspirates are unacceptable for Xpert Xpress SARS-CoV-2/FLU/RSV testing.  Fact Sheet for Patients: EntrepreneurPulse.com.au  Fact Sheet for Healthcare Providers: IncredibleEmployment.be  This test is not yet approved or cleared by the [place] FDA and has been authorized for detection and/or diagnosis of SARS-CoV-2 by FDA under an Emergency Use Authorization (EUA). This EUA will remain in effect (meaning this test can be used) for the duration of the COVID-19 declaration under Section 564(b)(1) of the Act, 21 U.S.C. section 360bbb-3(b)(1), unless the authorization is terminated or revoked.  Performed at [facility], [address] [place], [place] [address]          Radiology Studies: CT ABDOMEN PELVIS W CONTRAST  Result Date: 02/24/2021 CLINICAL DATA:  Lower abdominal and pelvic pain EXAM: CT ABDOMEN AND PELVIS WITH CONTRAST TECHNIQUE: Multidetector CT imaging of the abdomen and pelvis was performed using the standard protocol following bolus administration of  intravenous contrast. CONTRAST:  2mL OMNIPAQUE IOHEXOL 300 MG/ML  SOLN COMPARISON:  02/21/2021 FINDINGS: Lower chest: New atelectasis in the lower lobes and lingula. Trace bilateral pleural effusions are likewise new. Prior CABG. Coronary and aortic atherosclerosis. Contrast medium in the distal esophagus suggest dysmotility or reflux. Hepatobiliary: Borderline distended gallbladder. No biliary dilatation. No focal hepatic parenchymal lesion identified. Pancreas: Unremarkable Spleen: Unremarkable Adrenals/Urinary Tract: Both adrenal glands appear within normal limits. Borderline right hydroureter extending to the iliac vessel cross over with the ureters obscured by surrounding inflammatory findings. No hydronephrosis or delay in excretion of contrast medium. Small amount of left lateral perinephric edema. Stomach/Bowel: Normal appendix. Descending and sigmoid colon diverticulosis with notably prominent caliber of the sigmoid colon, considerable paracolic stranding around the distal descending and sigmoid colon, and substantial localized wall thickening along a 9 cm segment of the distal sigmoid colon for example on image 70 of series 2 suspicious for progressive diverticulitis and likely some superimposed regional colitis. Air-fluid level in the rectum. Peripheral intermediate density in the cecum for example on image 51 of series 2 simulates wall thickening, but there is no wall thickening in this region on 02/21/2021 hence this appearance is due to isodense fluid rather than actual cecal wall thickening. Vascular/Lymphatic: Aortoiliac atherosclerotic vascular disease. Scattered likely reactive retroperitoneal lymph nodes including an index left periaortic node measuring 0.8 cm in short axis on image 48 series 2. Reproductive: Unremarkable Other: New perihepatic and perisplenic ascites along with fluid tracking along the paracolic gutter. New pelvic ascites. No free intraperitoneal gas is identified. I do not  observe a discrete drainable abscess. Musculoskeletal: Unchanged compression fracture at T11. Lower lumbar spondylosis and degenerative disc disease. IMPRESSION: 1. Worsened prominent paracolic stranding around the distal descending and sigmoid colon, and substantial localized wall thickening along a  9 cm segment of the distal sigmoid colon suspicious for progressive diverticulitis and likely some superimposed regional colitis. No free intraperitoneal gas or discrete drainable abscess identified. 2. New perihepatic and perisplenic ascites along with fluid tracking along the paracolic gutters and pelvic ascites. 3. Trace bilateral pleural effusions with new atelectasis in the lower lobes and lingula. 4. Contrast medium in the distal esophagus suggest dysmotility or reflux. 5. Unchanged compression fracture at T11. 6. Borderline distended gallbladder. 7. Borderline right hydroureter extending to the iliac vessel cross over with the ureters obscured by surrounding inflammatory findings. No hydronephrosis or delay in excretion of contrast medium. 8. Aortic atherosclerosis. Aortic Atherosclerosis (ICD10-I70.0). Electronically Signed   By: Van Clines M.D.   On: 02/24/2021 13:58      Scheduled Meds: . atorvastatin  20 mg Oral Daily  . fluticasone  2 spray Each Nare Daily  . gabapentin  300 mg Oral TID  . levothyroxine  75 mcg Oral Q0600  . metoprolol succinate  50 mg Oral Daily   Continuous Infusions: . sodium chloride 100 mL/hr at 02/25/21 1717  . piperacillin-tazobactam (ZOSYN)  IV 3.375 g (02/26/21 0631)     LOS: 5 days      Debbe Odea, MD [facility] Pager: [email or web address] 02/26/2021, 8:36 AM

## 2021-02-26 NOTE — Care Management Important Message (Signed)
Important Message  Patient Details  Name: Desiree Madden MRN: [id number] Date of Birth: 11/02/1951   Medicare Important Message Given:  Yes     Dannette Barbara 02/26/2021, 12:09 PM

## 2021-02-26 NOTE — Progress Notes (Signed)
[facility] SURGICAL PROGRESS NOTE (cpt [phone])  Hospital Day(s): 5.   Interval History: Patient seen and examined, no acute events or new complaints overnight. Patient reports she is generally feeling better, but still having LLQ abdominal pain. She denies fever, chills, nausea, or emesis. Her leukocytosis continues to slowly improve, now down to 22.0K (from 29K at the highest 2 days ago). She was able to have return of bowel function yesterday evening. She has had multiple BMs recorded. Tolerating CLD without worsening of her pain.   Review of Systems:  Constitutional: denies fever, chills  HEENT: denies cough or congestion  Respiratory: denies any shortness of breath  Cardiovascular: denies chest pain or palpitations  Gastrointestinal: + abdominal pain, denied N/V, or diarrhea/and bowel function as per interval history Genitourinary: denies burning with urination or urinary frequency  Vital signs in last 24 hours: [min-max] current  Temp:  [97.5 F (36.4 C)-97.9 F (36.6 C)] 97.5 F (36.4 C) (03/21 0747) Pulse Rate:  [82-116] 102 (03/21 0747) Resp:  [17-20] 18 (03/21 0747) BP: (122-154)/(60-82) 122/60 (03/21 0747) SpO2:  [95 %-99 %] 95 % (03/21 0747)     Height: $Remove'5\' 4"'UvXqEdJ$  (162.6 cm) Weight: 67.2 kg BMI (Calculated): 25.41   Intake/Output last 2 shifts:  03/20 0701 - 03/21 0700 In: 3778.6 [I.V.:3778.6] Out: 1050 [Urine:250; Stool:800]   Physical Exam:  Constitutional: alert, cooperative and no distress  HENT: normocephalic without obvious abnormality  Eyes: PERRL, EOM's grossly intact and symmetric  Respiratory: breathing non-labored at rest  Cardiovascular: regular rate and sinus rhythm  Gastrointestinal: Soft, still with LLQ point tenderness, non-distended, no rebound/guarding, no peritonitis  Musculoskeletal: no edema or wounds, motor and sensation grossly intact, NT    Labs:  CBC Latest Ref Rng & Units 02/25/2021 02/24/2021 02/23/2021  WBC 4.0 - 10.5 K/uL  24.5(H) 29.6(H) 27.7(H)  Hemoglobin 12.0 - 15.0 g/dL 10.8(L) 11.8(L) 11.5(L)  Hematocrit 36.0 - 46.0 % 32.3(L) 36.2 34.8(L)  Platelets 150 - 400 K/uL 276 240 223   CMP Latest Ref Rng & Units 02/25/2021 02/24/2021 02/23/2021  Glucose 70 - 99 mg/dL 123(H) 120(H) 148(H)  BUN 8 - 23 mg/dL 35(H) 31(H) 24(H)  Creatinine 0.44 - 1.00 mg/dL 1.57(H) 1.50(H) 1.19(H)  Sodium 135 - 145 mmol/L 133(L) 136 138  Potassium 3.5 - 5.1 mmol/L 3.5 4.0 3.7  Chloride 98 - 111 mmol/L 110 114(H) 114(H)  CO2 22 - 32 mmol/L 16(L) 17(L) 18(L)  Calcium 8.9 - 10.3 mg/dL 7.9(L) 8.3(L) 8.2(L)  Total Protein 6.5 - 8.1 g/dL - - -  Total Bilirubin 0.3 - 1.2 mg/dL - - -  Alkaline Phos 38 - 126 U/L - - -  AST 15 - 41 U/L - - -  ALT 0 - 44 U/L - - -     Imaging studies: No new pertinent imaging studies   Assessment/Plan: (ICD-10's: K50.92) 70 y.o. female with return of bowel fucntion admitted with diverticulitis thought to be attributable high impaction/constipation, complicated by what sounds like a history of long standing colovesical fistula.    - Would recommend continuing CLD for now; would like yo see her pain improve more prior to diet advancement  - Continue with aggressive bowel regimen +/- gastrografin enema   - Continue IV Abx (Zosyn)  - No emergent surgical intervention; she is showing clinincal improvements. She does understand that should she worsening or fail to improve, we would need to pursue urgent intervention.    - Monitor abdominal examination  - Pain control prn; antiemetics prn  -  Monitor leukocytosis; improving  - Mobilization as tolerated   - further management per primary service; we will follow    All of the above findings and recommendations were discussed with the patient, and the medical team, and all of patient's questions were answered to her expressed satisfaction.  -- Edison Simon, PA-C [facility] 02/26/2021, 7:50 AM [phone] M-F: 7am - 4pm

## 2021-02-26 NOTE — Progress Notes (Signed)
Mobility Specialist - Progress Note   02/26/21 1700  Mobility  Activity Refused mobility  Mobility performed by Mobility specialist    Mobility attempted session 2x this date. Pt declined d/t wanting to rest. Will attempt session at another date/time.    Kathee Delton Mobility Specialist 02/26/21, 5:21 PM

## 2021-02-27 DIAGNOSIS — N322 Vesical fistula, not elsewhere classified: Secondary | ICD-10-CM | POA: Diagnosis not present

## 2021-02-27 DIAGNOSIS — K5792 Diverticulitis of intestine, part unspecified, without perforation or abscess without bleeding: Secondary | ICD-10-CM | POA: Diagnosis not present

## 2021-02-27 LAB — CBC
HCT: 27.8 % — ABNORMAL LOW (ref 36.0–46.0)
Hemoglobin: 9.6 g/dL — ABNORMAL LOW (ref 12.0–15.0)
MCH: 29.9 pg (ref 26.0–34.0)
MCHC: 34.5 g/dL (ref 30.0–36.0)
MCV: 86.6 fL (ref 80.0–100.0)
Platelets: 279 10*3/uL (ref 150–400)
RBC: 3.21 MIL/uL — ABNORMAL LOW (ref 3.87–5.11)
RDW: 14.2 % (ref 11.5–15.5)
WBC: 17.5 10*3/uL — ABNORMAL HIGH (ref 4.0–10.5)
nRBC: 0.1 % (ref 0.0–0.2)

## 2021-02-27 MED ORDER — CHLORHEXIDINE GLUCONATE 0.12 % MT SOLN
15.0000 mL | Freq: Two times a day (BID) | OROMUCOSAL | Status: DC
  Administered 2021-02-27 – 2021-03-01 (×5): 15 mL via OROMUCOSAL
  Filled 2021-02-27 (×5): qty 15

## 2021-02-27 MED ORDER — ORAL CARE MOUTH RINSE
15.0000 mL | Freq: Two times a day (BID) | OROMUCOSAL | Status: DC
  Administered 2021-02-27 – 2021-02-28 (×2): 15 mL via OROMUCOSAL

## 2021-02-27 NOTE — Progress Notes (Addendum)
PROGRESS NOTE    Desiree Madden   YTK:[id number]  DOB: February 20, 1951  DOA: 02/21/2021 PCP: [place]   Brief Narrative:  Desiree Madden is a 70 year old female with ulcerative colitis, diverticulosis who has a colovesical fistula.  She also has a history of hypothyroidism and hyperlipidemia. She presented to the hospital with severe abdominal pain and burning micturition.  She also vomiting 3 times on the day of admission.  She has been on and off antibiotics over the past few months for recurrent urinary tract infections related to the fistula.  She states she was supposed to have some sort of surgery for the fistula this past fall, however, it did not occur for some reason.  She is now waiting to see a gastroenterologist at the [facility] clinic on April 1st.  CT scan in the ED reveals possible diverticulitis and also a large amount of stool in the left colon.   UA consistent with a UTI.  She was started on ciprofloxacin and Flagyl.  Lactic acid 3.0.  Subjective: Abd pain improving. Continues to have BMs.     Assessment & Plan:   Principal Problem:   Leukocytosis due to Diverticulitis & UTI  Dehydration due to vomiting    AKI - vomiting likely related to infections + large stool burden -  urine culture> e faecium and E faecalis-sensitive to ampicillin - 3/18> on Cipro and Flagyl IV but WBC rising concerning for worsening infection and uncovered organism -  switched to Zosyn   - 3/19 CT abd/pelvis > showing increased inflammation- cont Zosyn which was started the day before - WBC improving daily now after switching to Zosyn  - advance to full liquids - Gen surgery assisting with management  - follow [facility] tomorrow to check Cr    Active Problems: Colovesical fistula in setting of Ulcerative colitis  - general surgery considering outpt diverting colostomy - not on DMARDs    Elevated lactic acid level - due to above- improved from 3.0 to 2.1  Severe Constipation   - given Sorbitol, Dulcolax suppository, Relistor and  Miralax bowel prep - beginning to have BMs after Relistor- I have cut back on narcotics as her pain and diverticulitis is improving.    Hyperlipidemia - Lipitor    Essential hypertension -  Cont Toprol  Episodes of non sustained SVT and V tach - occurring while Toprol was on hold- It has been resumed and episodes resolved    Hypothyroidism - cont Synthroid    Normocytic anemia - follow   Time spent in minutes: 35 DVT prophylaxis: SCDs Start: 02/21/21 2200 Code Status: Full code Family Communication:  Level of Care: Level of care: Progressive Cardiac Disposition Plan:  Status is: Inpatient Remains inpatient appropriate because:IV treatments appropriate due to intensity of illness or inability to take PO Dispo: The patient is from: Home              Anticipated d/c is to: Home              Patient currently is not medically stable to d/c.   Difficult to place patient No   Consultants:   GI  General surgery Procedures:   none Antimicrobials:  Anti-infectives (From admission, onward)   Start     Dose/Rate Route Frequency Ordered Stop   02/23/21 1230  piperacillin-tazobactam (ZOSYN) IVPB 3.375 g        3.375 g 12.5 mL/hr over 240 Minutes Intravenous Every 8 hours 02/23/21 1143     02/22/21 0600  ciprofloxacin (CIPRO) IVPB 400 mg  Status:  Discontinued        400 mg 200 mL/hr over 60 Minutes Intravenous Every 12 hours 02/21/21 2028 02/23/21 1143   02/22/21 0400  metroNIDAZOLE (FLAGYL) IVPB 500 mg  Status:  Discontinued        500 mg 100 mL/hr over 60 Minutes Intravenous Every 8 hours 02/21/21 2028 02/23/21 1143   02/21/21 1830  ciprofloxacin (CIPRO) IVPB 400 mg        400 mg 200 mL/hr over 60 Minutes Intravenous  Once 02/21/21 1820 02/21/21 1939   02/21/21 1830  metroNIDAZOLE (FLAGYL) IVPB 500 mg        500 mg 100 mL/hr over 60 Minutes Intravenous  Once 02/21/21 1820 02/21/21 2110       Objective: Vitals:    02/27/21 0400 02/27/21 0739 02/27/21 0900 02/27/21 1229  BP:  135/65  (!) 104/53  Pulse:  91  70  Resp:  (!) 22  (!) 22  Temp:  97.6 F (36.4 C)  98.8 F (37.1 C)  TempSrc:  Oral  Oral  SpO2:  100%  100%  Weight: 67.4 kg  66.9 kg   Height:        Intake/Output Summary (Last 24 hours) at 02/27/2021 1544 Last data filed at 02/27/2021 1434 Gross per 24 hour  Intake 940 ml  Output 0 ml  Net 940 ml   Filed Weights   02/26/21 1500 02/27/21 0400 02/27/21 0900  Weight: 68.2 kg 67.4 kg 66.9 kg    Examination: General exam: Appears comfortable  HEENT: PERRLA, oral mucosa moist, no sclera icterus or thrush Respiratory system: Clear to auscultation. Respiratory effort normal. Cardiovascular system: S1 & S2 heard, regular rate and rhythm Gastrointestinal system: Abdomen soft,  Tender in LLQ, nondistended. Normal bowel sounds   Central nervous system: Alert and oriented. No focal neurological deficits. Extremities: No cyanosis, clubbing or edema Skin: No rashes or ulcers Psychiatry:  Mood & affect appropriate.   Data Reviewed: I have personally reviewed following labs and imaging studies  CBC: Recent Labs  Lab 02/22/21 0147 02/23/21 0816 02/24/21 0552 02/25/21 0803 02/26/21 0823 02/27/21 0425  WBC 18.6* 27.7* 29.6* 24.5* 22.0* 17.5*  NEUTROABS 16.7*  --   --   --   --   --   HGB 11.6* 11.5* 11.8* 10.8* 10.9* 9.6*  HCT 34.3* 34.8* 36.2 32.3* 32.0* 27.8*  MCV 89.6 91.1 92.8 89.5 88.4 86.6  PLT 238 223 240 276 289 279   Basic Metabolic Panel: Recent Labs  Lab 02/21/21 1543 02/22/21 0147 02/23/21 0816 02/24/21 0552 02/25/21 0803  NA 136 139 138 136 133*  K 3.7 4.4 3.7 4.0 3.5  CL 106 111 114* 114* 110  CO2 20* 19* 18* 17* 16*  GLUCOSE 158* 154* 148* 120* 123*  BUN 34* 28* 24* 31* 35*  CREATININE 1.40* 1.16* 1.19* 1.50* 1.57*  CALCIUM 9.0 8.3* 8.2* 8.3* 7.9*  MG  --  1.8  --   --   --   PHOS  --  3.5  --   --   --    GFR: Estimated Creatinine Clearance: 31.4  mL/min (A) (by C-G formula based on SCr of 1.57 mg/dL (H)). Liver Function Tests: Recent Labs  Lab 02/21/21 1543 02/22/21 0147  AST 38 36  ALT 24 23  ALKPHOS 79 73  BILITOT 0.9 1.1  PROT 8.0 7.6  ALBUMIN 3.9 3.6   Recent Labs  Lab 02/21/21 1543  LIPASE 54*  No results for input(s): AMMONIA in the last 168 hours. Coagulation Profile: No results for input(s): INR, PROTIME in the last 168 hours. Cardiac Enzymes: No results for input(s): CKTOTAL, CKMB, CKMBINDEX, TROPONINI in the last 168 hours. BNP (last 3 results) No results for input(s): PROBNP in the last 8760 hours. HbA1C: No results for input(s): HGBA1C in the last 72 hours. CBG: No results for input(s): GLUCAP in the last 168 hours. Lipid Profile: No results for input(s): CHOL, HDL, LDLCALC, TRIG, CHOLHDL, LDLDIRECT in the last 72 hours. Thyroid Function Tests: No results for input(s): TSH, T4TOTAL, FREET4, T3FREE, THYROIDAB in the last 72 hours. Anemia Panel: No results for input(s): VITAMINB12, FOLATE, FERRITIN, TIBC, IRON, RETICCTPCT in the last 72 hours. Urine analysis:    Component Value Date/Time   COLORURINE YELLOW 02/21/2021 1543   APPEARANCEUR CLOUDY (A) 02/21/2021 1543   LABSPEC 1.020 02/21/2021 1543   PHURINE 7.0 02/21/2021 1543   GLUCOSEU NEGATIVE 02/21/2021 1543   HGBUR LARGE (A) 02/21/2021 1543   BILIRUBINUR NEGATIVE 02/21/2021 1543   KETONESUR NEGATIVE 02/21/2021 1543   PROTEINUR 100 (A) 02/21/2021 1543   NITRITE POSITIVE (A) 02/21/2021 1543   LEUKOCYTESUR LARGE (A) 02/21/2021 1543   Sepsis Labs: $RemoveBefo'@[email or web address]'[id number]$ (procalcitonin:4,lacticidven:4) ) Recent Results (from the past 240 hour(s))  Urine culture     Status: Abnormal   Collection Time: 02/21/21  3:43 PM   Specimen: Urine, Random  Result Value Ref Range Status   Specimen Description   Final    URINE, RANDOM Performed at [facility], [address] [place], [place] [address]    Special Requests   Final    NONE Performed at  [facility], [address] [place], [place] [address]    Culture (A)  Final    >=100,000 COLONIES/mL ENTEROCOCCUS FAECIUM >=100,000 COLONIES/mL ENTEROCOCCUS FAECALIS    Report Status 02/24/2021 FINAL  Final   Organism ID, Bacteria ENTEROCOCCUS FAECIUM (A)  Final   Organism ID, Bacteria ENTEROCOCCUS FAECALIS (A)  Final      Susceptibility   Enterococcus faecalis - MIC*    AMPICILLIN <=2 SENSITIVE Sensitive     NITROFURANTOIN <=16 SENSITIVE Sensitive     VANCOMYCIN 2 SENSITIVE Sensitive     * >=100,000 COLONIES/mL ENTEROCOCCUS FAECALIS   Enterococcus faecium - MIC*    AMPICILLIN <=2 SENSITIVE Sensitive     NITROFURANTOIN 64 INTERMEDIATE Intermediate     VANCOMYCIN <=0.5 SENSITIVE Sensitive     * >=100,000 COLONIES/mL ENTEROCOCCUS FAECIUM  Resp Panel by RT-PCR (Flu A&B, Covid) Nasopharyngeal Swab     Status: None   Collection Time: 02/21/21  7:36 PM   Specimen: Nasopharyngeal Swab; Nasopharyngeal(NP) swabs in vial transport medium  Result Value Ref Range Status   SARS Coronavirus 2 by RT PCR NEGATIVE NEGATIVE Final    Comment: (NOTE) SARS-CoV-2 target nucleic acids are NOT DETECTED.  The SARS-CoV-2 RNA is generally detectable in upper respiratory specimens during the acute phase of infection. The lowest concentration of SARS-CoV-2 viral copies this assay can detect is 138 copies/mL. A negative result does not preclude SARS-Cov-2 infection and should not be used as the sole basis for treatment or other patient management decisions. A negative result may occur with  improper specimen collection/handling, submission of specimen other than nasopharyngeal swab, presence of viral mutation(s) within the areas targeted by this assay, and inadequate number of viral copies(<138 copies/mL). A negative result must be combined with clinical observations, patient history, and epidemiological information. The expected result is Negative.  Fact Sheet for  Patients:   EntrepreneurPulse.com.au  Fact Sheet for Healthcare Providers:  IncredibleEmployment.be  This test is no t yet approved or cleared by the [place] FDA and  has been authorized for detection and/or diagnosis of SARS-CoV-2 by FDA under an Emergency Use Authorization (EUA). This EUA will remain  in effect (meaning this test can be used) for the duration of the COVID-19 declaration under Section 564(b)(1) of the Act, 21 U.S.C.section 360bbb-3(b)(1), unless the authorization is terminated  or revoked sooner.       Influenza A by PCR NEGATIVE NEGATIVE Final   Influenza B by PCR NEGATIVE NEGATIVE Final    Comment: (NOTE) The Xpert Xpress SARS-CoV-2/FLU/RSV plus assay is intended as an aid in the diagnosis of influenza from Nasopharyngeal swab specimens and should not be used as a sole basis for treatment. Nasal washings and aspirates are unacceptable for Xpert Xpress SARS-CoV-2/FLU/RSV testing.  Fact Sheet for Patients: EntrepreneurPulse.com.au  Fact Sheet for Healthcare Providers: IncredibleEmployment.be  This test is not yet approved or cleared by the [place] FDA and has been authorized for detection and/or diagnosis of SARS-CoV-2 by FDA under an Emergency Use Authorization (EUA). This EUA will remain in effect (meaning this test can be used) for the duration of the COVID-19 declaration under Section 564(b)(1) of the Act, 21 U.S.C. section 360bbb-3(b)(1), unless the authorization is terminated or revoked.  Performed at [facility], [address] [place],  [address]          Radiology Studies: DG BE (COLON)W SINGLE CM (SOL OR THIN BA)  Result Date: 02/26/2021 CLINICAL DATA:  No bowel movements for than 1 week. EXAM: WATER SOLUBLE CONTRAST ENEMA TECHNIQUE: Initial scout AP supine abdominal image obtained to insure adequate colon cleansing. Dilute Gastrografin was  introduced into the colon in a retrograde fashion and refluxed from the rectum to the cecum. FLUOROSCOPY TIME:  Fluoroscopy Time:  0.8 minute Radiation Exposure Index (if provided by the fluoroscopic device): 8.5 mGy Number of Acquired Spot Images: 0 COMPARISON:  None. FINDINGS: KUB: Small amount of oral contrast material within the colon. Large amount of stool in the sigmoid colon. No bowel dilatation to suggest obstruction. No evidence of pneumoperitoneum, portal venous gas or pneumatosis. No pathologic calcifications along the expected course of the ureters. No acute osseous abnormality. Single-contrast barium enema: An enema catheter was inserted and retention balloon distended under fluoroscopy. Dilute Gastrografin was then introduced into the colon. Contrast opacifies the rectosigmoid colon. Contrast could not be refluxed past the sigmoid colon. The patient was unable to retain the contrast after multiple attempts. IMPRESSION: Contrast opacifies the rectosigmoid colon. Contrast could not be refluxed past the sigmoid colon. The patient was unable to retain the contrast after multiple attempts. Electronically Signed   By: Kathreen Devoid   On: 02/26/2021 11:57      Scheduled Meds: . atorvastatin  20 mg Oral Daily  . chlorhexidine  15 mL Mouth Rinse BID  . fluticasone  2 spray Each Nare Daily  . gabapentin  300 mg Oral TID  . levothyroxine  75 mcg Oral Q0600  . mouth rinse  15 mL Mouth Rinse q12n4p  . metoprolol succinate  50 mg Oral Daily   Continuous Infusions: . piperacillin-tazobactam (ZOSYN)  IV 3.375 g (02/27/21 1434)     LOS: 6 days      Debbe Odea, MD [facility] Pager: [email or web address] 02/27/2021, 3:44 PM

## 2021-02-27 NOTE — Progress Notes (Signed)
Mobility Specialist - Progress Note   02/27/21 1500  Mobility  Activity Ambulated in hall  Level of Assistance Standby assist, set-up cues, supervision of patient - no hands on  Assistive Device Front wheel walker  Distance Ambulated (ft) 250 ft  Mobility Response Tolerated well  Mobility performed by Mobility specialist  $Mobility charge 1 Mobility    Pre-mobility: 78 HR, 98% SpO2 During mobility: 109 maxHR  Post-mobility: 91 HR, 97% SpO2   Pt ambulated in hallway with RW. RA. No LOB noted. No SOB. Did voice fatigue during ambulation, but was able to continue. RPE 6/10. SBA.   Kathee Delton Mobility Specialist 02/27/21, 3:26 PM

## 2021-02-27 NOTE — Progress Notes (Addendum)
[facility] SURGICAL PROGRESS NOTE (cpt [phone])  Hospital Day(s): 6.   Interval History: Patient seen and examined, no acute events or new complaints overnight. Patient reports she is feeling "a little loopy" this morning. She reports that her abdominal pain in the LLQ has improved some. She denies fever, chills, nausea, emesis. Her leukocytosis continues to improve, down to 17.5K. she has been on CLD; tolerating well. She continues to have multiple BMs in the last 24 hours.   Review of Systems:  Constitutional: denies fever, chills  HEENT: denies cough or congestion  Respiratory: denies any shortness of breath  Cardiovascular: denies chest pain or palpitations  Gastrointestinal: + abdominal pain (improving), denied N/V, or diarrhea/and bowel function as per interval history Genitourinary: denies burning with urination or urinary frequency  Vital signs in last 24 hours: [min-max] current  Temp:  [97.5 F (36.4 C)-98.8 F (37.1 C)] 98.8 F (37.1 C) (03/22 0355) Pulse Rate:  [76-102] 92 (03/22 0355) Resp:  [17-20] 20 (03/22 0355) BP: (84-122)/(49-61) 118/57 (03/22 0355) SpO2:  [95 %-100 %] 95 % (03/22 0355) Weight:  [67.4 kg-68.2 kg] 67.4 kg (03/22 0400)     Height: $Remove'5\' 4"'sqKrZXE$  (162.6 cm) Weight: 67.4 kg BMI (Calculated): 25.49   Intake/Output last 2 shifts:  03/21 0701 - 03/22 0700 In: 700 [P.O.:600; IV Piggyback:100] Out: 100 [Urine:100]   Physical Exam:  Constitutional: alert, cooperative and no distress  HENT: normocephalic without obvious abnormality  Eyes: PERRL, EOM's grossly intact and symmetric  Respiratory: breathing non-labored at rest  Cardiovascular: regular rate and sinus rhythm  Gastrointestinal: Soft, still with LLQ tenderness (improving), non-distended, no rebound/guarding, no peritonitis  Musculoskeletal: no edema or wounds, motor and sensation grossly intact, NT    Labs:  CBC Latest Ref Rng & Units 02/27/2021 02/26/2021 02/25/2021  WBC 4.0 - 10.5  K/uL 17.5(H) 22.0(H) 24.5(H)  Hemoglobin 12.0 - 15.0 g/dL 9.6(L) 10.9(L) 10.8(L)  Hematocrit 36.0 - 46.0 % 27.8(L) 32.0(L) 32.3(L)  Platelets 150 - 400 K/uL 279 289 276   CMP Latest Ref Rng & Units 02/25/2021 02/24/2021 02/23/2021  Glucose 70 - 99 mg/dL 123(H) 120(H) 148(H)  BUN 8 - 23 mg/dL 35(H) 31(H) 24(H)  Creatinine 0.44 - 1.00 mg/dL 1.57(H) 1.50(H) 1.19(H)  Sodium 135 - 145 mmol/L 133(L) 136 138  Potassium 3.5 - 5.1 mmol/L 3.5 4.0 3.7  Chloride 98 - 111 mmol/L 110 114(H) 114(H)  CO2 22 - 32 mmol/L 16(L) 17(L) 18(L)  Calcium 8.9 - 10.3 mg/dL 7.9(L) 8.3(L) 8.2(L)  Total Protein 6.5 - 8.1 g/dL - - -  Total Bilirubin 0.3 - 1.2 mg/dL - - -  Alkaline Phos 38 - 126 U/L - - -  AST 15 - 41 U/L - - -  ALT 0 - 44 U/L - - -     Imaging studies: No new pertinent imaging studies   Assessment/Plan: (ICD-10's: K65.92) 70 y.o. female with return of bowel fucntion admitted with diverticulitis thought to be attributable high impaction/constipation, complicated by what sounds like a history of long standing colovesical fistula.    - Okay for full liquids today             - Continue with aggressive bowel regimen             - Continue IV Abx (Zosyn)             - No emergent surgical intervention; she is showing clinincal improvements. She does understand that should she worsening or fail to improve, we would  need to pursue urgent intervention.               - Monitor abdominal examination             - Pain control prn; antiemetics prn             - Monitor leukocytosis; improving             - Mobilization as tolerated              - Further management per primary service; we will follow    All of the above findings and recommendations were discussed with the patient*, and the medical team, and all of patient's questions were answered to her expressed satisfaction.  -- Edison Simon, PA-C [facility] 02/27/2021, 7:28 AM [phone] M-F: 7am - 4pm  I agree with the  above documentation, which I have edited where appropriate.

## 2021-02-28 ENCOUNTER — Encounter: Payer: Self-pay | Admitting: Internal Medicine

## 2021-02-28 ENCOUNTER — Inpatient Hospital Stay: Payer: Medicare HMO

## 2021-02-28 DIAGNOSIS — N322 Vesical fistula, not elsewhere classified: Secondary | ICD-10-CM | POA: Diagnosis not present

## 2021-02-28 DIAGNOSIS — R935 Abnormal findings on diagnostic imaging of other abdominal regions, including retroperitoneum: Secondary | ICD-10-CM | POA: Diagnosis not present

## 2021-02-28 DIAGNOSIS — R509 Fever, unspecified: Secondary | ICD-10-CM | POA: Diagnosis not present

## 2021-02-28 DIAGNOSIS — N390 Urinary tract infection, site not specified: Secondary | ICD-10-CM | POA: Diagnosis not present

## 2021-02-28 DIAGNOSIS — K5792 Diverticulitis of intestine, part unspecified, without perforation or abscess without bleeding: Secondary | ICD-10-CM | POA: Diagnosis not present

## 2021-02-28 DIAGNOSIS — D72829 Elevated white blood cell count, unspecified: Secondary | ICD-10-CM | POA: Diagnosis not present

## 2021-02-28 LAB — CBC
HCT: 28.5 % — ABNORMAL LOW (ref 36.0–46.0)
Hemoglobin: 9.9 g/dL — ABNORMAL LOW (ref 12.0–15.0)
MCH: 30.1 pg (ref 26.0–34.0)
MCHC: 34.7 g/dL (ref 30.0–36.0)
MCV: 86.6 fL (ref 80.0–100.0)
Platelets: 231 10*3/uL (ref 150–400)
RBC: 3.29 MIL/uL — ABNORMAL LOW (ref 3.87–5.11)
RDW: 14.4 % (ref 11.5–15.5)
WBC: 21.2 10*3/uL — ABNORMAL HIGH (ref 4.0–10.5)
nRBC: 0 % (ref 0.0–0.2)

## 2021-02-28 LAB — BASIC METABOLIC PANEL
Anion gap: 8 (ref 5–15)
BUN: 29 mg/dL — ABNORMAL HIGH (ref 8–23)
CO2: 17 mmol/L — ABNORMAL LOW (ref 22–32)
Calcium: 7.7 mg/dL — ABNORMAL LOW (ref 8.9–10.3)
Chloride: 115 mmol/L — ABNORMAL HIGH (ref 98–111)
Creatinine, Ser: 1.49 mg/dL — ABNORMAL HIGH (ref 0.44–1.00)
GFR, Estimated: 38 mL/min — ABNORMAL LOW (ref >60)
Glucose, Bld: 88 mg/dL (ref 70–99)
Potassium: 3.2 mmol/L — ABNORMAL LOW (ref 3.5–5.1)
Sodium: 140 mmol/L (ref 135–145)

## 2021-02-28 MED ORDER — ENSURE ENLIVE PO LIQD
237.0000 mL | Freq: Three times a day (TID) | ORAL | Status: DC
  Administered 2021-02-28: 237 mL via ORAL

## 2021-02-28 MED ORDER — SODIUM CHLORIDE 0.9 % IV SOLN: INTRAVENOUS | Status: DC

## 2021-02-28 MED ORDER — SODIUM CHLORIDE 0.9 % IV SOLN
3.0000 g | Freq: Four times a day (QID) | INTRAVENOUS | Status: DC
  Administered 2021-02-28: 3 g via INTRAVENOUS
  Filled 2021-02-28: qty 8
  Filled 2021-02-28: qty 3
  Filled 2021-02-28 (×3): qty 8

## 2021-02-28 MED ORDER — IOHEXOL 300 MG/ML  SOLN
75.0000 mL | Freq: Once | INTRAMUSCULAR | Status: AC | PRN
  Administered 2021-02-28: 75 mL via INTRAVENOUS

## 2021-02-28 MED ORDER — IOHEXOL 9 MG/ML PO SOLN
500.0000 mL | ORAL | Status: AC
  Administered 2021-02-28 (×2): 500 mL via ORAL

## 2021-02-28 MED ORDER — PIPERACILLIN-TAZOBACTAM 3.375 G IVPB
3.3750 g | Freq: Three times a day (TID) | INTRAVENOUS | Status: DC
  Administered 2021-02-28 – 2021-03-01 (×3): 3.375 g via INTRAVENOUS
  Filled 2021-02-28 (×6): qty 50

## 2021-02-28 MED ORDER — ADULT MULTIVITAMIN W/MINERALS CH: 1.0000 | ORAL_TABLET | Freq: Every day | ORAL | Status: DC

## 2021-02-28 MED ORDER — FENTANYL CITRATE (PF) 100 MCG/2ML IJ SOLN
INTRAMUSCULAR | Status: DC | PRN
  Administered 2021-02-28: 50 ug via INTRAVENOUS

## 2021-02-28 MED ORDER — FENTANYL CITRATE (PF) 100 MCG/2ML IJ SOLN
INTRAMUSCULAR | Status: AC
  Filled 2021-02-28: qty 2

## 2021-02-28 MED ORDER — MIDAZOLAM HCL 2 MG/2ML IJ SOLN
INTRAMUSCULAR | Status: AC
  Filled 2021-02-28: qty 2

## 2021-02-28 MED ORDER — SODIUM CHLORIDE 0.9 % IV BOLUS
1000.0000 mL | Freq: Once | INTRAVENOUS | Status: AC
  Administered 2021-02-28: 1000 mL via INTRAVENOUS

## 2021-02-28 MED ORDER — POTASSIUM CHLORIDE 10 MEQ/100ML IV SOLN
10.0000 meq | INTRAVENOUS | Status: AC
  Administered 2021-02-28 (×2): 10 meq via INTRAVENOUS
  Filled 2021-02-28 (×2): qty 100

## 2021-02-28 MED ORDER — ENOXAPARIN SODIUM 40 MG/0.4ML ~~LOC~~ SOLN
40.0000 mg | SUBCUTANEOUS | Status: DC
  Administered 2021-02-28 – 2021-03-03 (×3): 40 mg via SUBCUTANEOUS
  Filled 2021-02-28 (×3): qty 0.4

## 2021-02-28 MED ORDER — SODIUM CHLORIDE 0.9% FLUSH
5.0000 mL | Freq: Three times a day (TID) | INTRAVENOUS | Status: DC
  Administered 2021-02-28 – 2021-03-26 (×57): 5 mL

## 2021-02-28 MED ORDER — PIPERACILLIN-TAZOBACTAM 3.375 G IVPB 30 MIN: 3.3750 g | Freq: Three times a day (TID) | INTRAVENOUS | Status: DC

## 2021-02-28 MED ORDER — MIDAZOLAM HCL 2 MG/2ML IJ SOLN
INTRAMUSCULAR | Status: DC | PRN
  Administered 2021-02-28: 1 mg via INTRAVENOUS

## 2021-02-28 NOTE — Consult Note (Signed)
Chief Complaint: Abscess  Referring Physician(s): Edison Simon, General Surgery  Supervising Physician: Corrie Mckusick  Patient Status: [facility] - In-pt  History of Present Illness: Desiree Madden is a 70 y.o. female with diverticulitis and diverticular abscess.   She has known, chronic colo-vesicle fistula.    She reports abdominal pain and generally feeling ill.   CT shows LLQ abscess, amenable to drainage.  Gas within the urinary bladder.   Past Medical History:  Diagnosis Date  . Hypertension   . Thyroid disease     Past Surgical History:  Procedure Laterality Date  . CARDIAC SURGERY      Allergies: Baclofen, Sertraline, Sulfa antibiotics, Ibuprofen, and Trazodone and nefazodone  Medications: Prior to Admission medications   Medication Sig Start Date End Date Taking? Authorizing Provider  alendronate (FOSAMAX) 70 MG tablet Take 70 mg by mouth once a week. Take with a full glass of water on an empty stomach.   Yes [provider]  aspirin EC 81 MG tablet Take 81 mg by mouth daily.   Yes [provider]  atorvastatin (LIPITOR) 20 MG tablet Take 20 mg by mouth daily.   Yes [provider]  diclofenac Sodium (VOLTAREN) 1 % GEL Apply topically 4 (four) times daily.   Yes [provider]  fluticasone (FLONASE) 50 MCG/ACT nasal spray Place 2 sprays into both nostrils daily.   Yes [provider]  gabapentin (NEURONTIN) 300 MG capsule Take 300 mg by mouth 3 (three) times daily.   Yes [provider]  levothyroxine (SYNTHROID) 75 MCG tablet Take 75 mcg by mouth daily before breakfast.   Yes [provider]  meloxicam (MOBIC) 15 MG tablet Take 15 mg by mouth daily.   Yes [provider]  metoprolol succinate (TOPROL-XL) 50 MG 24 hr tablet Take 50 mg by mouth daily. 11/23/20  Yes [provider]  oxyCODONE (OXY IR/ROXICODONE) 5 MG immediate release tablet Take 5 mg by mouth every 12 (twelve)  hours as needed.   Yes [provider]  traZODone (DESYREL) 100 MG tablet Take 50-100 mg by mouth at bedtime. Take 1/2 tablet by mouth each evening for sleep, can increase to $RemoveBef'100mg'cXTQrKttrm$  whole tablet after 1st week.   Yes [provider]     Family History  Problem Relation Age of Onset  . Arthritis Other   . CAD Mother     Social History   Socioeconomic History  . Marital status: Married    Spouse name: Not on file  . Number of children: Not on file  . Years of education: Not on file  . Highest education level: Not on file  Occupational History  . Not on file  Tobacco Use  . Smoking status: Former Research scientist (life sciences)  . Smokeless tobacco: Never Used  Vaping Use  . Vaping Use: Never used  Substance and Sexual Activity  . Alcohol use: Never  . Drug use: Never  . Sexual activity: Not on file  Other Topics Concern  . Not on file  Social History Narrative  . Not on file   Social Determinants of Health   Financial Resource Strain: Not on file  Food Insecurity: Not on file  Transportation Needs: Not on file  Physical Activity: Not on file  Stress: Not on file  Social Connections: Not on file      Review of Systems: A 12 point ROS discussed and pertinent positives are indicated in the HPI above.  All other systems are negative.  Review of  Systems  Vital Signs: BP (!) 129/107   Pulse 92   Temp 97.6 F (36.4 C) (Oral)   Resp (!) 22   Ht $R'5\' 4"'Lc$  (1.626 m)   Wt 67.2 kg   SpO2 100%   BMI 25.44 kg/m   Physical Exam General: 70 yo female appearing stated age.  Well-developed, well-nourished.  Ill-appearing. HEENT: Atraumatic, normocephalic.  Conjugate gaze, extra-ocular motor intact. No scleral icterus or scleral injection. No lesions on external ears, nose, lips, or gums.  Oral mucosa moist, pink.  Neck: Symmetric with no goiter enlargement.  Chest/Lungs:  Symmetric chest with inspiration/expiration.  No labored breathing.  Clear to auscultation with no wheezes,  rhonchi, or rales.  Heart:  RRR, with no third heart sounds appreciated. No JVD appreciated.  Abdomen:  TTP   Genito-urinary: Deferred Neurologic: Alert & Oriented to person, place, and time.   Normal affect and insight.  Appropriate questions.  Moving all 4 extremities with gross sensory intact.       Imaging: CT ABDOMEN PELVIS W CONTRAST  Result Date: 02/28/2021 CLINICAL DATA:  Left lower quadrant abdominal pain. History diverticulitis. EXAM: CT ABDOMEN AND PELVIS WITH CONTRAST TECHNIQUE: Multidetector CT imaging of the abdomen and pelvis was performed using the standard protocol following bolus administration of intravenous contrast. CONTRAST:  58mL OMNIPAQUE IOHEXOL 300 MG/ML  SOLN COMPARISON:  CT scan 02/24/2021 FINDINGS: Lower chest: Margins persistent small bilateral pleural effusions with overlying atelectasis. The heart is normal in size. No pericardial effusion. Stable aortic and advanced coronary artery calcifications. Surgical changes from coronary artery bypass surgery. The distal esophagus is grossly normal. Hepatobiliary: No biliary no hepatic lesions or intrahepatic biliary dilatation. The gallbladder is unremarkable. Persistent perihepatic fluid. No common bile duct dilatation. Pancreas: No mass, inflammation or ductal dilatation. Spleen: Normal size.  No focal lesions. Adrenals/Urinary Tract: The adrenal glands are normal. No renal lesions or hydroureteronephrosis. There is a large amount of gas in the bladder most likely from recent catheterization. Stomach/Bowel: The stomach, duodenum and small bowel are unremarkable. No acute inflammatory process or obstructive findings. Contrast gets all the way to the colon. Diffuse scattered colonic diverticulosis. Areas of colonic inflammation noted involving the descending colon and almost the entire sigmoid colon with marked wall thickening and submucosal edema. There is a new pericolonic abscess with a possible intramural component but definite  extramural component in the left pelvis measuring a maximum of 8.3 x 4.7 cm. It appears to contain a small amount of dependent contrast material suggesting active extravasation. Vascular/Lymphatic: Stable advanced atherosclerotic calcifications involving the aorta and iliac arteries. No aneurysm. The major venous structures are patent. There are numerous scattered borderline retroperitoneal lymph nodes which are likely inflammatory/reactive. Reproductive: The uterus and ovaries are grossly normal in stable. Other: Diffuse subcutaneous soft tissue swelling/edema suggesting anasarca. There is also scattered free abdominal and free pelvic fluid. Musculoskeletal: If no significant bony findings. IMPRESSION: 1. Persistent changes of acute severe diverticulitis involving the descending colon and almost the entire sigmoid colon. 2. New pericolonic abscess measuring a maximum of 8.3 x 4.7 cm. It appears to contain a small amount of dependent contrast material suggesting active extravasation. 3. Persistent small bilateral pleural effusions with overlying atelectasis. 4. Persistent perihepatic and free pelvic fluid. 5. Diffuse subcutaneous soft tissue swelling/edema suggesting anasarca. 6. Stable advanced atherosclerotic calcifications involving the aorta and iliac arteries. 7. Large amount of gas in the bladder most likely from recent catheterization. 8. Aortic atherosclerosis. These results will be called to the ordering clinician  or representative by the Radiologist Assistant, and communication documented in the PACS or [employer]. Aortic Atherosclerosis (ICD10-I70.0). Electronically Signed   By: Marijo Sanes M.D.   On: 02/28/2021 13:43   CT ABDOMEN PELVIS W CONTRAST  Result Date: 02/24/2021 CLINICAL DATA:  Lower abdominal and pelvic pain EXAM: CT ABDOMEN AND PELVIS WITH CONTRAST TECHNIQUE: Multidetector CT imaging of the abdomen and pelvis was performed using the standard protocol following bolus administration  of intravenous contrast. CONTRAST:  1mL OMNIPAQUE IOHEXOL 300 MG/ML  SOLN COMPARISON:  02/21/2021 FINDINGS: Lower chest: New atelectasis in the lower lobes and lingula. Trace bilateral pleural effusions are likewise new. Prior CABG. Coronary and aortic atherosclerosis. Contrast medium in the distal esophagus suggest dysmotility or reflux. Hepatobiliary: Borderline distended gallbladder. No biliary dilatation. No focal hepatic parenchymal lesion identified. Pancreas: Unremarkable Spleen: Unremarkable Adrenals/Urinary Tract: Both adrenal glands appear within normal limits. Borderline right hydroureter extending to the iliac vessel cross over with the ureters obscured by surrounding inflammatory findings. No hydronephrosis or delay in excretion of contrast medium. Small amount of left lateral perinephric edema. Stomach/Bowel: Normal appendix. Descending and sigmoid colon diverticulosis with notably prominent caliber of the sigmoid colon, considerable paracolic stranding around the distal descending and sigmoid colon, and substantial localized wall thickening along a 9 cm segment of the distal sigmoid colon for example on image 70 of series 2 suspicious for progressive diverticulitis and likely some superimposed regional colitis. Air-fluid level in the rectum. Peripheral intermediate density in the cecum for example on image 51 of series 2 simulates wall thickening, but there is no wall thickening in this region on 02/21/2021 hence this appearance is due to isodense fluid rather than actual cecal wall thickening. Vascular/Lymphatic: Aortoiliac atherosclerotic vascular disease. Scattered likely reactive retroperitoneal lymph nodes including an index left periaortic node measuring 0.8 cm in short axis on image 48 series 2. Reproductive: Unremarkable Other: New perihepatic and perisplenic ascites along with fluid tracking along the paracolic gutter. New pelvic ascites. No free intraperitoneal gas is identified. I do not  observe a discrete drainable abscess. Musculoskeletal: Unchanged compression fracture at T11. Lower lumbar spondylosis and degenerative disc disease. IMPRESSION: 1. Worsened prominent paracolic stranding around the distal descending and sigmoid colon, and substantial localized wall thickening along a 9 cm segment of the distal sigmoid colon suspicious for progressive diverticulitis and likely some superimposed regional colitis. No free intraperitoneal gas or discrete drainable abscess identified. 2. New perihepatic and perisplenic ascites along with fluid tracking along the paracolic gutters and pelvic ascites. 3. Trace bilateral pleural effusions with new atelectasis in the lower lobes and lingula. 4. Contrast medium in the distal esophagus suggest dysmotility or reflux. 5. Unchanged compression fracture at T11. 6. Borderline distended gallbladder. 7. Borderline right hydroureter extending to the iliac vessel cross over with the ureters obscured by surrounding inflammatory findings. No hydronephrosis or delay in excretion of contrast medium. 8. Aortic atherosclerosis. Aortic Atherosclerosis (ICD10-I70.0). Electronically Signed   By: Van Clines M.D.   On: 02/24/2021 13:58   CT ABDOMEN PELVIS W CONTRAST  Result Date: 02/21/2021 CLINICAL DATA:  Lower abdominal pain. History of diverticulitis. 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 EXAM: CT ABDOMEN AND PELVIS WITH CONTRAST TECHNIQUE: Multidetector CT imaging of the abdomen and pelvis was performed using the standard protocol following bolus administration of intravenous contrast. CONTRAST:  61mL OMNIPAQUE IOHEXOL 300 MG/ML  SOLN COMPARISON:  None. FINDINGS: Lower chest: No basilar consolidation or pleural effusion. Minimal posterior pleural thickening on the left. Coronary  artery calcifications. Epicardial leads in place. Hepatobiliary: Minimal focal fatty infiltration adjacent to the gallbladder fossa. No suspicious hepatic  lesion. No calcified gallstone or pericholecystic fat stranding. No biliary dilatation. Pancreas: No ductal dilatation or inflammation. Spleen: Normal in size without focal abnormality. Adrenals/Urinary Tract: Normal adrenal glands. Lobulated bilateral renal contours. Slight atrophy of left kidney. No hydronephrosis. No perinephric edema. Tiny cyst in the upper left kidney. No visualized renal stone. Symmetric excretion on delayed phase imaging. The urinary bladder is displaced into the left pelvis. There is a in bladder or fluid level. No perivesicular fat stranding. No definite bladder wall thickening. Loss of fat plane between the superior aspect of the urinary bladder and a sigmoid colonic diverticulum. Stomach/Bowel: Pericolonic fat stranding about the proximal sigmoid colon, series 2, image 62, in the region of multiple colonic diverticula suspicious for diverticulitis. This fat stranding extends to the adjacent urinary bladder. There are numerable colonic diverticula from the splenic flexure distally. Large volume of colonic stool from the splenic flexure distally. Mild sigmoid colonic wall thickening which is nonspecific. There are scattered right colonic diverticula. Transverse colon is tortuous. Small hiatal hernia. Stomach is decompressed. No small bowel obstruction or inflammation. Normal air-filled appendix. Vascular/Lymphatic: Moderately advanced aortic atherosclerosis. Irregular plaque in the upper abdominal aorta. Incidental 2 right and left renal arteries. No aneurysm. Calcified plaque throughout the left common iliac artery causes some degree of stenosis. Patent portal vein. No portal or mesenteric venous gas. Multiple small retroperitoneal lymph nodes, typically reactive. No bulky abdominopelvic adenopathy. Reproductive: Uterus remains in situ, atrophic and deviated to the left. No adnexal mass. Other: No free air or focal fluid collection. Minimal free fluid in the left pelvis. No abdominal wall  hernia. Musculoskeletal: Chronic but progressive moderately severe T11 compression fracture. Increase vertebral body height loss since 11/23/2019 thoracic spine CT, particularly anteriorly. Hemi transitional lumbosacral anatomy with enlarged right transverse process and pseudoarticulation with the sacrum. Bones are subjectively under mineralized. IMPRESSION: 1. Advanced colonic diverticulosis with pericolonic fat stranding about the proximal sigmoid colon, in the region of multiple colonic diverticula, suspicious for acute diverticulitis. This fat stranding extends to the adjacent urinary bladder. 2. Fluid level in the urinary bladder. This may be due to Foley catheter/instrumentation, or fistula as provided history. Pericolonic edema extends to the anterior aspect of the bladder. 3. Large volume of colonic stool from the splenic flexure distally, suggesting constipation. There is wall thickening in the mid sigmoid colon that is nonspecific. Recommend up-to-date colonoscopy to exclude the possibility of colonic neoplasm. 4. Chronic but progressive moderately severe T11 compression fracture. Increase vertebral body height loss since 11/23/2019 thoracic spine CT, particularly anteriorly. Aortic Atherosclerosis (ICD10-I70.0). Electronically Signed   By: Keith Rake M.D.   On: 02/21/2021 16:54   DG Abd Portable 2V  Result Date: 02/23/2021 CLINICAL DATA:  Abdominal pain, diverticulitis EXAM: PORTABLE ABDOMEN - 2 VIEW COMPARISON:  02/21/2021 CT abdomen/pelvis FINDINGS: Intact visualized lower sternotomy wires. No dilated small bowel loops. Large diffuse colonic stool volume. No evidence pneumatosis or pneumoperitoneum. No radiopaque nephrolithiasis. IMPRESSION: Large diffuse colonic stool volume, suggesting constipation. Nonobstructive bowel gas pattern. No evidence of pneumoperitoneum. Electronically Signed   By: Ilona Sorrel M.D.   On: 02/23/2021 10:38   DG BE (COLON)W SINGLE CM (SOL OR THIN BA)  Result  Date: 02/26/2021 CLINICAL DATA:  No bowel movements for than 1 week. EXAM: WATER SOLUBLE CONTRAST ENEMA TECHNIQUE: Initial scout AP supine abdominal image obtained to insure adequate colon cleansing. Dilute Gastrografin was  introduced into the colon in a retrograde fashion and refluxed from the rectum to the cecum. FLUOROSCOPY TIME:  Fluoroscopy Time:  0.8 minute Radiation Exposure Index (if provided by the fluoroscopic device): 8.5 mGy Number of Acquired Spot Images: 0 COMPARISON:  None. FINDINGS: KUB: Small amount of oral contrast material within the colon. Large amount of stool in the sigmoid colon. No bowel dilatation to suggest obstruction. No evidence of pneumoperitoneum, portal venous gas or pneumatosis. No pathologic calcifications along the expected course of the ureters. No acute osseous abnormality. Single-contrast barium enema: An enema catheter was inserted and retention balloon distended under fluoroscopy. Dilute Gastrografin was then introduced into the colon. Contrast opacifies the rectosigmoid colon. Contrast could not be refluxed past the sigmoid colon. The patient was unable to retain the contrast after multiple attempts. IMPRESSION: Contrast opacifies the rectosigmoid colon. Contrast could not be refluxed past the sigmoid colon. The patient was unable to retain the contrast after multiple attempts. Electronically Signed   By: Kathreen Devoid   On: 02/26/2021 11:57    Labs:  CBC: Recent Labs    02/25/21 0803 02/26/21 0823 02/27/21 0425 02/28/21 0613  WBC 24.5* 22.0* 17.5* 21.2*  HGB 10.8* 10.9* 9.6* 9.9*  HCT 32.3* 32.0* 27.8* 28.5*  PLT 276 289 279 231    COAGS: No results for input(s): INR, APTT in the last 8760 hours.  BMP: Recent Labs    02/23/21 0816 02/24/21 0552 02/25/21 0803 02/28/21 0613  NA 138 136 133* 140  K 3.7 4.0 3.5 3.2*  CL 114* 114* 110 115*  CO2 18* 17* 16* 17*  GLUCOSE 148* 120* 123* 88  BUN 24* 31* 35* 29*  CALCIUM 8.2* 8.3* 7.9* 7.7*   CREATININE 1.19* 1.50* 1.57* 1.49*  GFRNONAA 49* 37* 35* 38*    LIVER FUNCTION TESTS: Recent Labs    02/21/21 1543 02/22/21 0147  BILITOT 0.9 1.1  AST 38 36  ALT 24 23  ALKPHOS 79 73  PROT 8.0 7.6  ALBUMIN 3.9 3.6    TUMOR MARKERS: No results for input(s): AFPTM, CEA, CA199, CHROMGRNA in the last 8760 hours.  Assessment and Plan:  Desiree Madden ia a 70 yo female, presenting to VIR for image guided drainage.   Risks and benefits discussed with the patient including bleeding, infection, damage to adjacent structures, bowel perforation/fistula connection, and sepsis.  All of the patient's questions were answered, patient is agreeable to proceed. Consent signed and in chart.    Thank you for this interesting consult.  I greatly enjoyed meeting Desiree Madden and look forward to participating in their care.  A copy of this report was sent to the requesting provider on this date.  Electronically Signed: Corrie Mckusick, DO 02/28/2021, 2:12 PM   I spent a total of 20 Minutes    in face to face in clinical consultation, greater than 50% of which was counseling/coordinating care for diverticular abscess, possible drainage

## 2021-02-28 NOTE — Plan of Care (Signed)
Pt is alert, oriented x4, converses appropriately, appears weak, with sluggish movement, unsteady on her feet, assist x1 to bedside commode, with fever of 101.0 early on shift, oral tylenol given, fever resolved. BP 84/48 at 2359, paged H. Damita Dunnings, MD via secure chat, IV bolus and maintenance ordered. BP improved, pt appeared more alert and stable on her feet, able to get up and brush teeth unassisted this morning. Systolic blood pressure has improved to low 100's.

## 2021-02-28 NOTE — Progress Notes (Signed)
Initial Nutrition Assessment  DOCUMENTATION CODES:   Not applicable  INTERVENTION:   RD will monitor for diet advancement vs the need for nutrition support  Recommend TPN if unable to advance diet in the next 1-2 days  Pt at high refeed risk  NUTRITION DIAGNOSIS:   Inadequate oral intake related to acute illness as evidenced by NPO status.  GOAL:   Patient will meet greater than or equal to 90% of their needs  MONITOR:   Labs,Diet advancement,Weight trends,Skin,I & O's  REASON FOR ASSESSMENT:   NPO/Clear Liquid Diet    ASSESSMENT:   70 y.o. female with h/o HTN, CKD III, colovesical fistula, hypothroidism and HLD who is admitted with diverticulitis and constipation.  Pt s/p IR drain today  Unable to see pt today as pt in procedure at time of RD visit. Per chart review, pt has remained mainly on NPO/liquid diet since admit and is now without adequate nutrition for > 7 days. Pt does not appear to have good oral intake at times when she is on a diet. Recommend TPN if unable to advance diet in the next 1-2 days. Pt is at refeed risk. RD will add supplements once diet advanced. RD will obtain nutrition related history and exam at follow-up.   Per chart, pt is down 14lbs(9%) over the past year; this is not significant.   Medications reviewed and include: synthroid, NaCl $RemoveBeforeD'@75ml'[id number]$ /hr, unasyn  Labs reviewed: K 3.2(L), BUN 29(H), creat 1.49(H) Wbc- 21.2(H), Hgb 9.9(L), Hct 28.5(L)  NUTRITION - FOCUSED PHYSICAL EXAM: Unable to perform at this time   Diet Order:   Diet Order            Diet NPO time specified  Diet effective now                EDUCATION NEEDS:   Not appropriate for education at this time  Skin:  Skin Assessment: Reviewed RN Assessment (ecchymosis)  Last BM:  3/23- type 7  Height:   Ht Readings from Last 1 Encounters:  02/21/21 $RemoveB'5\' 4"'hTAWqama$  (1.626 m)    Weight:   Wt Readings from Last 1 Encounters:  02/28/21 67.2 kg    Ideal Body Weight:  54.5  kg  BMI:  Body mass index is 25.44 kg/m.  Estimated Nutritional Needs:   Kcal:  1700-1900kcal/day  Protein:  85-95g/day  Fluid:  1.4-1.7L/day  Koleen Distance MS, RD, LDN Please refer to [facility] for RD and/or RD on-call/weekend/after hours pager

## 2021-02-28 NOTE — Progress Notes (Signed)
Patient said she can't take gabapentin because it makes her "loopy". Dr Roosevelt Locks notified and said to discontinue it

## 2021-02-28 NOTE — Progress Notes (Addendum)
Pt has a bed transfer to [facility] room 227. Report called to RN. Pt off the unit now at CT. Will inform pt of move once she returns to the unit. Family called and updated about the transfer. Will continue to monitor.   UPDATE 1151AM: Pt back from CT, pt made aware of transfer orders, in agreeance with move.

## 2021-02-28 NOTE — Progress Notes (Signed)
Pt is at special procedure at this time.

## 2021-02-28 NOTE — Progress Notes (Signed)
PROGRESS NOTE    Desiree Madden  DSK:[id number] DOB: [birth year] DOA: 02/21/2021 PCP: Edna    Brief Narrative:   Desiree Madden is a 70 year old female with ulcerative colitis, diverticulosis with colovesical fistula who presented to the hospital with severe abdominal pain and burning urination, nausea and vomiting  She has been on and off antibiotics over the past few months for recurrent urinary tract infections related to the fistula.   CT scan in the ED reveals possible diverticulitis and also a large amount of stool in the left colon.  UA consistent with a UTI.  She was started on ciprofloxacin and Flagyl and changed to Zosyn  Lactic acid 3.0.  Assessment & Plan:   Principal Problem:   Diverticulitis Active Problems:   Fistula, bladder   Ulcerative colitis ([place])   Hyperlipidemia   Essential hypertension   Hypothyroidism   Abnormal ECG   CKD (chronic kidney disease), stage III (HCC)   Elevated lactic acid level  #1.  Leukocytosis with a lactic acidosis secondary to diverticulitis and UTI. Acute diverticulitis abdominal abscess. Enterococcus urinary tract infection secondary to colovesical fistula. Sepsis. Not POA.  Reviewed patient original lab and vital signs, patient did not meet sepsis at time.  By that he met sepsis criteria on 3/22 with a temperature 101, heart rate 92, leukocytosis of 24.5. Urine culture positive for Enterococcus Faecium and Enterococcus Faecalis.  Patient had abscess drained on 3/23.  Culture from drain was sent out. Patient fever has came down.  Continue Zosyn for now.  2.  Severe constipation. Had  2 large bowel movement yesterday.  3.  Chronic kidney disease disease stage IIIb. Reviewed prior lab before admission from outside facility, baseline creatinine 1.2 with GFR of 44.  Condition is relatively stable.   4.  Essential hypertension. Continue metoprolol.  5.  Episodes of nonsustained supra ventricular tachycardia  and ventricular tachycardia. No recurrence.          DVT prophylaxis: Lovenox Code Status: Full Family Communication:  Disposition Plan:  .   Status is: Inpatient  Remains inpatient appropriate because:Inpatient level of care appropriate due to severity of illness   Dispo: The patient is from: Home              Anticipated d/c is to: Home              Patient currently is not medically stable to d/c.   Difficult to place patient No        I/O last 3 completed shifts: In: 2610.7 [P.O.:1160; I.V.:300; IV Piggyback:1150.7] Out: 2 [Urine:1; Stool:1] No intake/output data recorded.     Consultants:   IR, surgery  Procedures: IR drain of abdominal abscess.  Antimicrobials: Zosyn.  Subjective: Patient had a fever last night, no additional fever today. He has some mild shortness of breath today, on 2 L oxygen after the procedure.  Discontinue IV fluids. Denies any cough. Some abdominal pain from initial draining.  No nausea vomiting.  Had a 2 large bowel movements yesterday. No chest pain or palpitation.  Objective: Vitals:   02/28/21 1455 02/28/21 1500 02/28/21 1518 02/28/21 1624  BP: (!) 118/51 125/63 128/75 121/66  Pulse: 94  (!) 107 100  Resp: (!) 25 (!) 29 (!) 22 (!) 24  Temp:   97.7 F (36.5 C) 97.8 F (36.6 C)  TempSrc:   Oral Oral  SpO2: 96% 99% 95% 99%  Weight:      Height:  Intake/Output Summary (Last 24 hours) at 02/28/2021 1636 Last data filed at 02/28/2021 0518 Gross per 24 hour  Intake 1790.72 ml  Output 2 ml  Net 1788.72 ml   Filed Weights   02/27/21 0400 02/27/21 0900 02/28/21 1014  Weight: 67.4 kg 66.9 kg 67.2 kg    Examination:  General exam: Appears calm and comfortable  Respiratory system: Clear to auscultation. Respiratory effort normal. Cardiovascular system: S1 & S2 heard, RRR. No JVD, murmurs, rubs, gallops or clicks. No pedal edema. Gastrointestinal system: Abdomen is nondistended, soft and tender. No rebound.  No organomegaly or masses felt. Normal bowel sounds heard. Central nervous system: Alert and oriented. No focal neurological deficits. Extremities: Symmetric 5 x 5 power. Skin: No rashes, lesions or ulcers Psychiatry: JMood & affect appropriate.     Data Reviewed: I have personally reviewed following labs and imaging studies  CBC: Recent Labs  Lab 02/22/21 0147 02/23/21 0816 02/24/21 0552 02/25/21 0803 02/26/21 0823 02/27/21 0425 02/28/21 0613  WBC 18.6*   < > 29.6* 24.5* 22.0* 17.5* 21.2*  NEUTROABS 16.7*  --   --   --   --   --   --   HGB 11.6*   < > 11.8* 10.8* 10.9* 9.6* 9.9*  HCT 34.3*   < > 36.2 32.3* 32.0* 27.8* 28.5*  MCV 89.6   < > 92.8 89.5 88.4 86.6 86.6  PLT 238   < > 240 276 289 279 231   < > = values in this interval not displayed.   Basic Metabolic Panel: Recent Labs  Lab 02/22/21 0147 02/23/21 0816 02/24/21 0552 02/25/21 0803 02/28/21 0613  NA 139 138 136 133* 140  K 4.4 3.7 4.0 3.5 3.2*  CL 111 114* 114* 110 115*  CO2 19* 18* 17* 16* 17*  GLUCOSE 154* 148* 120* 123* 88  BUN 28* 24* 31* 35* 29*  CREATININE 1.16* 1.19* 1.50* 1.57* 1.49*  CALCIUM 8.3* 8.2* 8.3* 7.9* 7.7*  MG 1.8  --   --   --   --   PHOS 3.5  --   --   --   --    GFR: Estimated Creatinine Clearance: 33.1 mL/min (A) (by C-G formula based on SCr of 1.49 mg/dL (H)). Liver Function Tests: Recent Labs  Lab 02/22/21 0147  AST 36  ALT 23  ALKPHOS 73  BILITOT 1.1  PROT 7.6  ALBUMIN 3.6   No results for input(s): LIPASE, AMYLASE in the last 168 hours. No results for input(s): AMMONIA in the last 168 hours. Coagulation Profile: No results for input(s): INR, PROTIME in the last 168 hours. Cardiac Enzymes: No results for input(s): CKTOTAL, CKMB, CKMBINDEX, TROPONINI in the last 168 hours. BNP (last 3 results) No results for input(s): PROBNP in the last 8760 hours. HbA1C: No results for input(s): HGBA1C in the last 72 hours. CBG: No results for input(s): GLUCAP in the last 168  hours. Lipid Profile: No results for input(s): CHOL, HDL, LDLCALC, TRIG, CHOLHDL, LDLDIRECT in the last 72 hours. Thyroid Function Tests: No results for input(s): TSH, T4TOTAL, FREET4, T3FREE, THYROIDAB in the last 72 hours. Anemia Panel: No results for input(s): VITAMINB12, FOLATE, FERRITIN, TIBC, IRON, RETICCTPCT in the last 72 hours. Sepsis Labs: Recent Labs  Lab 02/21/21 2244 02/22/21 0147 02/23/21 0824 02/24/21 0552  LATICACIDVEN 2.8* 2.3* 2.1* 2.2*    Recent Results (from the past 240 hour(s))  Urine culture     Status: Abnormal   Collection Time: 02/21/21  3:43 PM  Specimen: Urine, Random  Result Value Ref Range Status   Specimen Description   Final    URINE, RANDOM Performed at [facility], [address] [place], [place] [address]    Special Requests   Final    NONE Performed at [facility], [place], [place] [address]    Culture (A)  Final    >=100,000 COLONIES/mL ENTEROCOCCUS FAECIUM >=100,000 COLONIES/mL ENTEROCOCCUS FAECALIS    Report Status 02/24/2021 FINAL  Final   Organism ID, Bacteria ENTEROCOCCUS FAECIUM (A)  Final   Organism ID, Bacteria ENTEROCOCCUS FAECALIS (A)  Final      Susceptibility   Enterococcus faecalis - MIC*    AMPICILLIN <=2 SENSITIVE Sensitive     NITROFURANTOIN <=16 SENSITIVE Sensitive     VANCOMYCIN 2 SENSITIVE Sensitive     * >=100,000 COLONIES/mL ENTEROCOCCUS FAECALIS   Enterococcus faecium - MIC*    AMPICILLIN <=2 SENSITIVE Sensitive     NITROFURANTOIN 64 INTERMEDIATE Intermediate     VANCOMYCIN <=0.5 SENSITIVE Sensitive     * >=100,000 COLONIES/mL ENTEROCOCCUS FAECIUM  Resp Panel by RT-PCR (Flu A&B, Covid) Nasopharyngeal Swab     Status: None   Collection Time: 02/21/21  7:36 PM   Specimen: Nasopharyngeal Swab; Nasopharyngeal(NP) swabs in vial transport medium  Result Value Ref Range Status   SARS Coronavirus 2 by RT PCR NEGATIVE NEGATIVE Final    Comment: (NOTE) SARS-CoV-2 target  nucleic acids are NOT DETECTED.  The SARS-CoV-2 RNA is generally detectable in upper respiratory specimens during the acute phase of infection. The lowest concentration of SARS-CoV-2 viral copies this assay can detect is 138 copies/mL. A negative result does not preclude SARS-Cov-2 infection and should not be used as the sole basis for treatment or other patient management decisions. A negative result may occur with  improper specimen collection/handling, submission of specimen other than nasopharyngeal swab, presence of viral mutation(s) within the areas targeted by this assay, and inadequate number of viral copies(<138 copies/mL). A negative result must be combined with clinical observations, patient history, and epidemiological information. The expected result is Negative.  Fact Sheet for Patients:  EntrepreneurPulse.com.au  Fact Sheet for Healthcare Providers:  IncredibleEmployment.be  This test is no t yet approved or cleared by the [place] FDA and  has been authorized for detection and/or diagnosis of SARS-CoV-2 by FDA under an Emergency Use Authorization (EUA). This EUA will remain  in effect (meaning this test can be used) for the duration of the COVID-19 declaration under Section 564(b)(1) of the Act, 21 U.S.C.section 360bbb-3(b)(1), unless the authorization is terminated  or revoked sooner.       Influenza A by PCR NEGATIVE NEGATIVE Final   Influenza B by PCR NEGATIVE NEGATIVE Final    Comment: (NOTE) The Xpert Xpress SARS-CoV-2/FLU/RSV plus assay is intended as an aid in the diagnosis of influenza from Nasopharyngeal swab specimens and should not be used as a sole basis for treatment. Nasal washings and aspirates are unacceptable for Xpert Xpress SARS-CoV-2/FLU/RSV testing.  Fact Sheet for Patients: EntrepreneurPulse.com.au  Fact Sheet for Healthcare  Providers: IncredibleEmployment.be  This test is not yet approved or cleared by the [place] FDA and has been authorized for detection and/or diagnosis of SARS-CoV-2 by FDA under an Emergency Use Authorization (EUA). This EUA will remain in effect (meaning this test can be used) for the duration of the COVID-19 declaration under Section 564(b)(1) of the Act, 21 U.S.C. section 360bbb-3(b)(1), unless the authorization is terminated or revoked.  Performed  at [facility], [address], [place], [place] [address]          Radiology Studies: CT ABDOMEN PELVIS W CONTRAST  Result Date: 02/28/2021 CLINICAL DATA:  Left lower quadrant abdominal pain. History diverticulitis. EXAM: CT ABDOMEN AND PELVIS WITH CONTRAST TECHNIQUE: Multidetector CT imaging of the abdomen and pelvis was performed using the standard protocol following bolus administration of intravenous contrast. CONTRAST:  65m OMNIPAQUE IOHEXOL 300 MG/ML  SOLN COMPARISON:  CT scan 02/24/2021 FINDINGS: Lower chest: Margins persistent small bilateral pleural effusions with overlying atelectasis. The heart is normal in size. No pericardial effusion. Stable aortic and advanced coronary artery calcifications. Surgical changes from coronary artery bypass surgery. The distal esophagus is grossly normal. Hepatobiliary: No biliary no hepatic lesions or intrahepatic biliary dilatation. The gallbladder is unremarkable. Persistent perihepatic fluid. No common bile duct dilatation. Pancreas: No mass, inflammation or ductal dilatation. Spleen: Normal size.  No focal lesions. Adrenals/Urinary Tract: The adrenal glands are normal. No renal lesions or hydroureteronephrosis. There is a large amount of gas in the bladder most likely from recent catheterization. Stomach/Bowel: The stomach, duodenum and small bowel are unremarkable. No acute inflammatory process or obstructive findings. Contrast gets all the way to the colon.  Diffuse scattered colonic diverticulosis. Areas of colonic inflammation noted involving the descending colon and almost the entire sigmoid colon with marked wall thickening and submucosal edema. There is a new pericolonic abscess with a possible intramural component but definite extramural component in the left pelvis measuring a maximum of 8.3 x 4.7 cm. It appears to contain a small amount of dependent contrast material suggesting active extravasation. Vascular/Lymphatic: Stable advanced atherosclerotic calcifications involving the aorta and iliac arteries. No aneurysm. The major venous structures are patent. There are numerous scattered borderline retroperitoneal lymph nodes which are likely inflammatory/reactive. Reproductive: The uterus and ovaries are grossly normal in stable. Other: Diffuse subcutaneous soft tissue swelling/edema suggesting anasarca. There is also scattered free abdominal and free pelvic fluid. Musculoskeletal: If no significant bony findings. IMPRESSION: 1. Persistent changes of acute severe diverticulitis involving the descending colon and almost the entire sigmoid colon. 2. New pericolonic abscess measuring a maximum of 8.3 x 4.7 cm. It appears to contain a small amount of dependent contrast material suggesting active extravasation. 3. Persistent small bilateral pleural effusions with overlying atelectasis. 4. Persistent perihepatic and free pelvic fluid. 5. Diffuse subcutaneous soft tissue swelling/edema suggesting anasarca. 6. Stable advanced atherosclerotic calcifications involving the aorta and iliac arteries. 7. Large amount of gas in the bladder most likely from recent catheterization. 8. Aortic atherosclerosis. These results will be called to the ordering clinician or representative by the Radiologist Assistant, and communication documented in the PACS or [employer] Aortic Atherosclerosis (ICD10-I70.0). Electronically Signed   By: PMarijo SanesM.D.   On: 02/28/2021 13:43    CT IMAGE GUIDED DRAINAGE BY PERCUTANEOUS CATHETER  Result Date: 02/28/2021 INDICATION: 70year old female with a history of diverticular abscess. Known colovesical fistula. She presents for drainage of the abscess EXAM: CT GUIDED DRAINAGE OF  ABSCESS MEDICATIONS: The patient is currently admitted to the hospital and receiving intravenous antibiotics. The antibiotics were administered within an appropriate time frame prior to the initiation of the procedure. ANESTHESIA/SEDATION: 1.0 mg IV Versed 50 mcg IV Fentanyl Moderate Sedation Time:  11 minutes The patient was continuously monitored during the procedure by the interventional radiology nurse under my direct supervision. COMPLICATIONS: None TECHNIQUE: Informed written consent was obtained from the patient after a thorough discussion of the procedural risks,  benefits and alternatives. All questions were addressed. Maximal Sterile Barrier Technique was utilized including caps, mask, sterile gowns, sterile gloves, sterile drape, hand hygiene and skin antiseptic. A timeout was performed prior to the initiation of the procedure. PROCEDURE: The left lower quadrant was prepped with chlorhexidine in a sterile fashion, and a sterile drape was applied covering the operative field. A sterile gown and sterile gloves were used for the procedure. Local anesthesia was provided with 1% Lidocaine. Once the patient was prepped and draped and anesthetized with 1% lidocaine, CT guidance was used to place a 18 gauge trocar needle into the abscess of the left lower quadrant. Modified Seldinger technique was then used to place a 12 [place] drain. Approximately 100 cc of thin amber fluid aspirated for a culture. Drain was attached to bulb suction and sutured in position. Patient tolerated the procedure well and remained hemodynamically stable throughout. No complications were encountered and no significant blood loss. FINDINGS: Final image demonstrates pigtail drainage catheter in  place within the abscess of the left lower quadrant. The only aspirate achieved was thin amber fluid. There is persisting flocculent material around the drain at the completion. This may represent frank contamination of the peritoneum with fecal material. Approximately 100 cc of fluid aspirated with a sample sent for culture. IMPRESSION: Status post CT-guided drainage of left lower quadrant abscess Signed, Dulcy Fanny. Dellia Nims, RPVI [facility] Radiology Electronically Signed   By: Corrie Mckusick D.O.   On: 02/28/2021 15:13        Scheduled Meds: . atorvastatin  20 mg Oral Daily  . chlorhexidine  15 mL Mouth Rinse BID  . feeding supplement  237 mL Oral TID BM  . fentaNYL      . fluticasone  2 spray Each Nare Daily  . levothyroxine  75 mcg Oral Q0600  . mouth rinse  15 mL Mouth Rinse q12n4p  . metoprolol succinate  50 mg Oral Daily  . midazolam      . [START ON 03/01/2021] multivitamin with minerals  1 tablet Oral Daily  . sodium chloride flush  5 mL Intracatheter Q8H   Continuous Infusions: . sodium chloride 75 mL/hr at 02/28/21 1327  . piperacillin-tazobactam       LOS: 7 days    Time spent: 32 minutes    Sharen Hones, MD [facility]   To contact the attending provider between 7A-7P or the covering provider during after hours 7P-7A, please log into the web site [email or web address] and access using universal [place] password for that web site. If you do not have the password, please call the hospital operator.  02/28/2021, 4:36 PM

## 2021-02-28 NOTE — Procedures (Signed)
Interventional Radiology Procedure Note  Procedure: Image guided drain placement, LLQ.  10F pigtail drain.  Complications: None  EBL: None Sample: Culture sent  Recommendations: - Routine drain care, with sterile flushes, record output - follow up Cx - routine wound care  Signed,  Dulcy Fanny. Earleen Newport, DO

## 2021-02-28 NOTE — Progress Notes (Signed)
[facility] SUR[facility] PROGRESS NOTE (cpt [phone])  Hospital Day(s): 7. .   Interval History: Patient seen and examined, no acute events or new complaints overnight. Patient reports she continues have LLQ abdominal pain which is unchanged over the last day or so. She denies nausea or emesis. Her leukocytosis did worsening this morning, now up to 21.2K. She did have a fever overnight to 101F at 2000. Her renal function appears to have remained at baseline, sCr  1.49. Mild hypokalemia to 3.2. She has been on full liquid diet and having multiple BMs.   Review of Systems:  Constitutional: + fever HEENT: denies cough or congestion  Respiratory: denies any shortness of breath  Cardiovascular: denies chest pain or palpitations  Gastrointestinal: + abdominal pain, denied N/V, or diarrhea/and bowel function as per interval history Genitourinary: denies burning with urination or urinary frequency  Vital signs in last 24 hours: [min-max] current  Temp:  [98.3 F (36.8 C)-101 F (38.3 C)] 98.3 F (36.8 C) (03/23 0506) Pulse Rate:  [68-92] 69 (03/23 0506) Resp:  [16-24] 16 (03/23 0506) BP: (89-106)/(43-55) 104/47 (03/23 0506) SpO2:  [96 %-100 %] 98 % (03/23 0506) Weight:  [66.9 kg] 66.9 kg (03/22 0900)     Height: $Remove'5\' 4"'gLKdywh$  (162.6 cm) Weight: 66.9 kg BMI (Calculated): 25.31   Intake/Output last 2 shifts:  03/22 0701 - 03/23 0700 In: 2200.7 [P.O.:800; I.V.:300; IV Piggyback:1100.7] Out: 2 [Urine:1; Stool:1]   Physical Exam:  Constitutional: alert, cooperative and no distress  HENT: normocephalic without obvious abnormality  Eyes: PERRL, EOM's grossly intact and symmetric  Respiratory: breathing non-labored at rest  Cardiovascular: regular rate and sinus rhythm  Gastrointestinal:Soft, she remains point tender in the LLQ and now suprapubic region, worse than previous days, non-distended, no rebound/guarding, no peritonitis Musculoskeletal: no edema or wounds, motor and sensation  grossly intact, NT    Labs:  CBC Latest Ref Rng & Units 02/28/2021 02/27/2021 02/26/2021  WBC 4.0 - 10.5 K/uL 21.2(H) 17.5(H) 22.0(H)  Hemoglobin 12.0 - 15.0 g/dL 9.9(L) 9.6(L) 10.9(L)  Hematocrit 36.0 - 46.0 % 28.5(L) 27.8(L) 32.0(L)  Platelets 150 - 400 K/uL 231 279 289   CMP Latest Ref Rng & Units 02/28/2021 02/25/2021 02/24/2021  Glucose 70 - 99 mg/dL 88 123(H) 120(H)  BUN 8 - 23 mg/dL 29(H) 35(H) 31(H)  Creatinine 0.44 - 1.00 mg/dL 1.49(H) 1.57(H) 1.50(H)  Sodium 135 - 145 mmol/L 140 133(L) 136  Potassium 3.5 - 5.1 mmol/L 3.2(L) 3.5 4.0  Chloride 98 - 111 mmol/L 115(H) 110 114(H)  CO2 22 - 32 mmol/L 17(L) 16(L) 17(L)  Calcium 8.9 - 10.3 mg/dL 7.7(L) 7.9(L) 8.3(L)  Total Protein 6.5 - 8.1 g/dL - - -  Total Bilirubin 0.3 - 1.2 mg/dL - - -  Alkaline Phos 38 - 126 U/L - - -  AST 15 - 41 U/L - - -  ALT 0 - 44 U/L - - -     Imaging studies: No new pertinent imaging studies   Assessment/Plan: (ICD-10's: K57.92) 70 y.o.femalewith now worsening leukocytosis and persistent abdominal painadmitted with diverticulitis thought to be attributable high impaction/constipation, complicated by what sounds like a history of long standing colovesical fistula.    - given her fever overnight, increase in leukocytosis, and persistent vs worsening abdominal pain, I do think it is prudent to repeat CT Abdomen/Pelvis this morning to reassess her intra-abdominal process.     - I will make her NPO this more pending CT completion, may need to restart IVF  - Continue  IV Abx (Zosyn) - No emergent surgical intervention; however, her change in clinical status is more concerning. She is more open to the idea of surgical intervention should she require this.  - Monitor abdominal examination - Pain control prn; antiemetics prn - Monitor leukocytosis; improving - Mobilization as tolerated - Further management per primary service; we will  follow   All of the above findings and recommendations were discussed with the patient, and the medical team, and all of patient's questions were answered to her expressed satisfaction.   -- Edison Simon, PA-C [facility] 02/28/2021, 7:41 AM [phone] M-F: 7am - 4pm

## 2021-02-28 NOTE — Progress Notes (Signed)
Patient stable after procedure. Patient taken back to room 227. Bedside report given to [place] RN.

## 2021-02-28 NOTE — Progress Notes (Signed)
Mobility Specialist - Progress Note   02/28/21 1628  Mobility  Activity Refused mobility  Mobility performed by Mobility specialist    Pt politely declined mobility this date. 2nd attempt. Pt initially requested mobility to return after procedure, now requesting to rest at this time. Will attempt session at another date/time as appropriate.    Kathee Delton Mobility Specialist 02/28/21, 4:30 PM

## 2021-02-28 NOTE — Progress Notes (Signed)
   02/28/21 1518  Assess: MEWS Score  Temp 97.7 F (36.5 C)  BP 128/75  Pulse Rate (!) 107  Resp (!) 22  Level of Consciousness Alert  SpO2 95 %  O2 Device Nasal Cannula  O2 Flow Rate (L/min) 2 L/min  Assess: MEWS Score  MEWS Temp 0  MEWS Systolic 0  MEWS Pulse 1  MEWS RR 1  MEWS LOC 0  MEWS Score 2  MEWS Score Color Yellow  Assess: if the MEWS score is Yellow or Red  Were vital signs taken at a resting state? Yes  Focused Assessment Change from prior assessment (see assessment flowsheet)  Early Detection of Sepsis Score *See Row Information* Low  MEWS guidelines implemented *See Row Information* Yes  Treat  MEWS Interventions Escalated (See documentation below)  Pain Scale 0-10  Pain Score 10  Pain Type Surgical pain;Acute pain  Pain Location Abdomen  Pain Orientation Left;Right;Lower  Take Vital Signs  Increase Vital Sign Frequency  Yellow: Q 2hr X 2 then Q 4hr X 2, if remains yellow, continue Q 4hrs  Escalate  MEWS: Escalate Yellow: discuss with charge nurse/RN and consider discussing with provider and RRT  Notify: Charge Nurse/RN  Name of Charge Nurse/RN Notified Abelino Derrick  Date Charge Nurse/RN Notified 02/28/21  Time Charge Nurse/RN Notified 1626  Notify: Provider  Provider Name/Title Zack,PA  Date Provider Notified 02/28/21  Time Provider Notified 1627  Notification Type Page  Notification Reason Change in status  Provider response No new orders  Date of Provider Response 02/28/21  Time of Provider Response 1634  Notify: Rapid Response  Name of Rapid Response RN Notified Not needed  Document  Patient Outcome Other (Comment) (pain med given, remain on the floor)  Progress note created (see row info) Yes

## 2021-03-01 ENCOUNTER — Encounter
Admission: EM | Disposition: A | Discharge status: Rehabilitation Facility | Payer: Self-pay | Source: Home / Self Care | Attending: Internal Medicine

## 2021-03-01 ENCOUNTER — Inpatient Hospital Stay: Payer: Medicare HMO

## 2021-03-01 ENCOUNTER — Inpatient Hospital Stay: Payer: Medicare HMO | Admitting: Anesthesiology

## 2021-03-01 ENCOUNTER — Encounter: Payer: Self-pay | Admitting: Internal Medicine

## 2021-03-01 DIAGNOSIS — B9689 Other specified bacterial agents as the cause of diseases classified elsewhere: Secondary | ICD-10-CM | POA: Diagnosis not present

## 2021-03-01 DIAGNOSIS — K651 Peritoneal abscess: Secondary | ICD-10-CM | POA: Diagnosis not present

## 2021-03-01 DIAGNOSIS — K5792 Diverticulitis of intestine, part unspecified, without perforation or abscess without bleeding: Secondary | ICD-10-CM | POA: Diagnosis not present

## 2021-03-01 DIAGNOSIS — N390 Urinary tract infection, site not specified: Secondary | ICD-10-CM | POA: Diagnosis not present

## 2021-03-01 DIAGNOSIS — K5720 Diverticulitis of large intestine with perforation and abscess without bleeding: Secondary | ICD-10-CM | POA: Diagnosis not present

## 2021-03-01 DIAGNOSIS — N322 Vesical fistula, not elsewhere classified: Secondary | ICD-10-CM | POA: Diagnosis not present

## 2021-03-01 HISTORY — PX: COLECTOMY WITH COLOSTOMY CREATION/HARTMANN PROCEDURE: SHX6598

## 2021-03-01 LAB — CBC WITH DIFFERENTIAL/PLATELET
Abs Immature Granulocytes: 0.67 10*3/uL — ABNORMAL HIGH (ref 0.00–0.07)
Basophils Absolute: 0.1 10*3/uL (ref 0.0–0.1)
Basophils Relative: 0 %
Eosinophils Absolute: 0 10*3/uL (ref 0.0–0.5)
Eosinophils Relative: 0 %
HCT: 18.7 % — ABNORMAL LOW (ref 36.0–46.0)
Hemoglobin: 6.1 g/dL — ABNORMAL LOW (ref 12.0–15.0)
Immature Granulocytes: 3 %
Lymphocytes Relative: 5 %
Lymphs Abs: 1.4 10*3/uL (ref 0.7–4.0)
MCH: 29.3 pg (ref 26.0–34.0)
MCHC: 32.6 g/dL (ref 30.0–36.0)
MCV: 89.9 fL (ref 80.0–100.0)
Monocytes Absolute: 0.5 10*3/uL (ref 0.1–1.0)
Monocytes Relative: 2 %
Neutro Abs: 24.5 10*3/uL — ABNORMAL HIGH (ref 1.7–7.7)
Neutrophils Relative %: 90 %
Platelets: 237 10*3/uL (ref 150–400)
RBC: 2.08 MIL/uL — ABNORMAL LOW (ref 3.87–5.11)
RDW: 14.6 % (ref 11.5–15.5)
WBC: 27.1 10*3/uL — ABNORMAL HIGH (ref 4.0–10.5)
nRBC: 0.1 % (ref 0.0–0.2)

## 2021-03-01 LAB — CBC
HCT: 32.2 % — ABNORMAL LOW (ref 36.0–46.0)
Hemoglobin: 11.2 g/dL — ABNORMAL LOW (ref 12.0–15.0)
MCH: 29.7 pg (ref 26.0–34.0)
MCHC: 34.8 g/dL (ref 30.0–36.0)
MCV: 85.4 fL (ref 80.0–100.0)
Platelets: 334 10*3/uL (ref 150–400)
RBC: 3.77 MIL/uL — ABNORMAL LOW (ref 3.87–5.11)
RDW: 14.6 % (ref 11.5–15.5)
WBC: 28.2 10*3/uL — ABNORMAL HIGH (ref 4.0–10.5)
nRBC: 0 % (ref 0.0–0.2)

## 2021-03-01 LAB — COMPREHENSIVE METABOLIC PANEL
ALT: 12 U/L (ref 0–44)
AST: 34 U/L (ref 15–41)
Albumin: 1.7 g/dL — ABNORMAL LOW (ref 3.5–5.0)
Alkaline Phosphatase: 55 U/L (ref 38–126)
Anion gap: 11 (ref 5–15)
BUN: 29 mg/dL — ABNORMAL HIGH (ref 8–23)
CO2: 17 mmol/L — ABNORMAL LOW (ref 22–32)
Calcium: 6.9 mg/dL — ABNORMAL LOW (ref 8.9–10.3)
Chloride: 111 mmol/L (ref 98–111)
Creatinine, Ser: 1.44 mg/dL — ABNORMAL HIGH (ref 0.44–1.00)
GFR, Estimated: 39 mL/min — ABNORMAL LOW (ref >60)
Glucose, Bld: 146 mg/dL — ABNORMAL HIGH (ref 70–99)
Potassium: 3.7 mmol/L (ref 3.5–5.1)
Sodium: 139 mmol/L (ref 135–145)
Total Bilirubin: 0.7 mg/dL (ref 0.3–1.2)
Total Protein: 3.7 g/dL — ABNORMAL LOW (ref 6.5–8.1)

## 2021-03-01 LAB — BASIC METABOLIC PANEL
Anion gap: 12 (ref 5–15)
BUN: 30 mg/dL — ABNORMAL HIGH (ref 8–23)
CO2: 15 mmol/L — ABNORMAL LOW (ref 22–32)
Calcium: 7.5 mg/dL — ABNORMAL LOW (ref 8.9–10.3)
Chloride: 110 mmol/L (ref 98–111)
Creatinine, Ser: 1.63 mg/dL — ABNORMAL HIGH (ref 0.44–1.00)
GFR, Estimated: 34 mL/min — ABNORMAL LOW (ref >60)
Glucose, Bld: 124 mg/dL — ABNORMAL HIGH (ref 70–99)
Potassium: 2.7 mmol/L — CL (ref 3.5–5.1)
Sodium: 137 mmol/L (ref 135–145)

## 2021-03-01 LAB — MAGNESIUM: Magnesium: 1.9 mg/dL (ref 1.7–2.4)

## 2021-03-01 LAB — APTT: aPTT: 47 seconds — ABNORMAL HIGH (ref 24–36)

## 2021-03-01 LAB — LACTIC ACID, PLASMA
Lactic Acid, Venous: 7 mmol/L (ref 0.5–1.9)
Lactic Acid, Venous: 6.8 mmol/L (ref 0.5–1.9)

## 2021-03-01 LAB — PHOSPHORUS: Phosphorus: 5.2 mg/dL — ABNORMAL HIGH (ref 2.5–4.6)

## 2021-03-01 LAB — GLUCOSE, CAPILLARY
Glucose-Capillary: 112 mg/dL — ABNORMAL HIGH (ref 70–99)
Glucose-Capillary: 177 mg/dL — ABNORMAL HIGH (ref 70–99)

## 2021-03-01 LAB — MRSA PCR SCREENING: MRSA by PCR: NEGATIVE

## 2021-03-01 LAB — ABO/RH: ABO/RH(D): O POS

## 2021-03-01 LAB — SURGICAL PCR SCREEN
MRSA, PCR: NEGATIVE
Staphylococcus aureus: NEGATIVE

## 2021-03-01 LAB — PREPARE RBC (CROSSMATCH)

## 2021-03-01 LAB — PROTIME-INR
INR: 1.7 — ABNORMAL HIGH (ref 0.8–1.2)
Prothrombin Time: 19.6 seconds — ABNORMAL HIGH (ref 11.4–15.2)

## 2021-03-01 LAB — TROPONIN I (HIGH SENSITIVITY)
Troponin I (High Sensitivity): 63 ng/L — ABNORMAL HIGH (ref <18)
Troponin I (High Sensitivity): 233 ng/L (ref <18)

## 2021-03-01 LAB — POTASSIUM: Potassium: 3.4 mmol/L — ABNORMAL LOW (ref 3.5–5.1)

## 2021-03-01 LAB — FIBRINOGEN: Fibrinogen: 383 mg/dL (ref 210–475)

## 2021-03-01 SURGERY — COLECTOMY, WITH COLOSTOMY CREATION
Anesthesia: General | Laterality: Left

## 2021-03-01 MED ORDER — FENTANYL CITRATE (PF) 100 MCG/2ML IJ SOLN
25.0000 ug | INTRAMUSCULAR | Status: DC | PRN
  Administered 2021-03-02: 50 ug via INTRAVENOUS
  Administered 2021-03-06 – 2021-03-07 (×3): 100 ug via INTRAVENOUS
  Filled 2021-03-01: qty 2

## 2021-03-01 MED ORDER — ORAL CARE MOUTH RINSE
15.0000 mL | OROMUCOSAL | Status: DC
  Administered 2021-03-01 – 2021-03-30 (×229): 15 mL via OROMUCOSAL

## 2021-03-01 MED ORDER — ESMOLOL HCL 100 MG/10ML IV SOLN
INTRAVENOUS | Status: AC
  Filled 2021-03-01: qty 10

## 2021-03-01 MED ORDER — LACTULOSE 10 GM/15ML PO SOLN: 20.0000 g | Freq: Once | ORAL | Status: DC

## 2021-03-01 MED ORDER — ALBUMIN HUMAN 5 % IV SOLN
25.0000 g | Freq: Once | INTRAVENOUS | Status: DC
  Filled 2021-03-01 (×2): qty 500

## 2021-03-01 MED ORDER — IPRATROPIUM-ALBUTEROL 0.5-2.5 (3) MG/3ML IN SOLN: 3.0000 mL | RESPIRATORY_TRACT | Status: DC | PRN

## 2021-03-01 MED ORDER — DOCUSATE SODIUM 50 MG/5ML PO LIQD
100.0000 mg | Freq: Two times a day (BID) | ORAL | Status: DC
  Administered 2021-03-02 – 2021-03-23 (×31): 100 mg
  Filled 2021-03-01 (×36): qty 10

## 2021-03-01 MED ORDER — LIDOCAINE HCL (PF) 2 % IJ SOLN
INTRAMUSCULAR | Status: AC
  Filled 2021-03-01: qty 5

## 2021-03-01 MED ORDER — VASOPRESSIN 20 UNIT/ML IV SOLN
INTRAVENOUS | Status: AC
  Filled 2021-03-01: qty 1

## 2021-03-01 MED ORDER — SODIUM CHLORIDE 0.9 % IV SOLN
250.0000 mL | INTRAVENOUS | Status: DC
  Administered 2021-03-01 – 2021-03-20 (×2): 250 mL via INTRAVENOUS

## 2021-03-01 MED ORDER — PROPOFOL 500 MG/50ML IV EMUL
INTRAVENOUS | Status: DC | PRN
  Administered 2021-03-01: 50 ug/kg/min via INTRAVENOUS

## 2021-03-01 MED ORDER — NOREPINEPHRINE 4 MG/250ML-% IV SOLN: 2.0000 ug/min | INTRAVENOUS | Status: DC

## 2021-03-01 MED ORDER — METHYLENE BLUE 0.5 % INJ SOLN
INTRAVENOUS | Status: DC | PRN
  Administered 2021-03-01: 1 mL

## 2021-03-01 MED ORDER — STERILE WATER FOR INJECTION IV SOLN
INTRAVENOUS | Status: DC
  Filled 2021-03-01: qty 150
  Filled 2021-03-01 (×11): qty 850

## 2021-03-01 MED ORDER — ACETAMINOPHEN 10 MG/ML IV SOLN
INTRAVENOUS | Status: DC | PRN
  Administered 2021-03-01: 1000 mg via INTRAVENOUS

## 2021-03-01 MED ORDER — METOPROLOL TARTRATE 5 MG/5ML IV SOLN
INTRAVENOUS | Status: DC | PRN
  Administered 2021-03-01 (×2): 1 mg via INTRAVENOUS

## 2021-03-01 MED ORDER — SODIUM CHLORIDE 0.9 % IV SOLN
2.0000 g | Freq: Two times a day (BID) | INTRAVENOUS | Status: AC
  Administered 2021-03-01 – 2021-03-06 (×11): 2 g via INTRAVENOUS
  Filled 2021-03-01 (×12): qty 2

## 2021-03-01 MED ORDER — ONDANSETRON HCL 4 MG/2ML IJ SOLN
INTRAMUSCULAR | Status: AC
  Filled 2021-03-01: qty 2

## 2021-03-01 MED ORDER — FENTANYL CITRATE (PF) 100 MCG/2ML IJ SOLN
INTRAMUSCULAR | Status: AC
  Filled 2021-03-01: qty 2

## 2021-03-01 MED ORDER — METRONIDAZOLE IN NACL 5-0.79 MG/ML-% IV SOLN
500.0000 mg | Freq: Three times a day (TID) | INTRAVENOUS | Status: AC
  Administered 2021-03-01 – 2021-03-06 (×15): 500 mg via INTRAVENOUS
  Filled 2021-03-01 (×18): qty 100

## 2021-03-01 MED ORDER — CHLORHEXIDINE GLUCONATE CLOTH 2 % EX PADS
6.0000 | MEDICATED_PAD | Freq: Every day | CUTANEOUS | Status: DC
  Administered 2021-03-01 – 2021-03-03 (×3): 6 via TOPICAL

## 2021-03-01 MED ORDER — NOREPINEPHRINE 4 MG/250ML-% IV SOLN
INTRAVENOUS | Status: AC
  Administered 2021-03-01: 25 ug/min via INTRAVENOUS
  Filled 2021-03-01: qty 250

## 2021-03-01 MED ORDER — PROPOFOL 10 MG/ML IV BOLUS
INTRAVENOUS | Status: AC
  Filled 2021-03-01: qty 20

## 2021-03-01 MED ORDER — DEXAMETHASONE SODIUM PHOSPHATE 10 MG/ML IJ SOLN
INTRAMUSCULAR | Status: DC | PRN
  Administered 2021-03-01: 10 mg via INTRAVENOUS

## 2021-03-01 MED ORDER — ALBUMIN HUMAN 5 % IV SOLN
INTRAVENOUS | Status: AC
  Filled 2021-03-01: qty 500

## 2021-03-01 MED ORDER — VANCOMYCIN HCL 750 MG/150ML IV SOLN
750.0000 mg | INTRAVENOUS | Status: DC
  Administered 2021-03-02: 750 mg via INTRAVENOUS
  Filled 2021-03-01: qty 150

## 2021-03-01 MED ORDER — POTASSIUM CHLORIDE 10 MEQ/100ML IV SOLN
10.0000 meq | INTRAVENOUS | Status: AC
Start: 2021-03-01 — End: 2021-03-01
  Administered 2021-03-01 (×4): 10 meq via INTRAVENOUS
  Filled 2021-03-01 (×4): qty 100

## 2021-03-01 MED ORDER — VASOPRESSIN 20 UNITS/100 ML INFUSION FOR SHOCK
0.0000 [IU]/min | INTRAVENOUS | Status: DC
  Administered 2021-03-01 – 2021-03-03 (×5): 0.03 [IU]/min via INTRAVENOUS
  Administered 2021-03-04: 0.02 [IU]/min via INTRAVENOUS
  Filled 2021-03-01 (×6): qty 100

## 2021-03-01 MED ORDER — SODIUM CHLORIDE 0.9% IV SOLUTION: Freq: Once | INTRAVENOUS | Status: AC

## 2021-03-01 MED ORDER — FENTANYL CITRATE (PF) 100 MCG/2ML IJ SOLN
25.0000 ug | INTRAMUSCULAR | Status: DC | PRN
  Administered 2021-03-05: 25 ug via INTRAVENOUS

## 2021-03-01 MED ORDER — LACTATED RINGERS IV SOLN: INTRAVENOUS | Status: DC

## 2021-03-01 MED ORDER — SODIUM BICARBONATE 8.4 % IV SOLN
50.0000 meq | Freq: Once | INTRAVENOUS | Status: AC
  Administered 2021-03-01: 50 meq via INTRAVENOUS

## 2021-03-01 MED ORDER — NOREPINEPHRINE 4 MG/250ML-% IV SOLN
0.0000 ug/min | INTRAVENOUS | Status: DC
  Administered 2021-03-01: 25 ug/min via INTRAVENOUS
  Administered 2021-03-02: 1 ug/min via INTRAVENOUS
  Filled 2021-03-01 (×2): qty 250

## 2021-03-01 MED ORDER — HYDROCORTISONE NA SUCCINATE PF 100 MG IJ SOLR
50.0000 mg | Freq: Four times a day (QID) | INTRAMUSCULAR | Status: DC
  Administered 2021-03-01 – 2021-03-07 (×23): 50 mg via INTRAVENOUS
  Filled 2021-03-01 (×22): qty 2

## 2021-03-01 MED ORDER — METHYLENE BLUE 0.5 % INJ SOLN
INTRAVENOUS | Status: AC
  Filled 2021-03-01: qty 10

## 2021-03-01 MED ORDER — SODIUM BICARBONATE 8.4 % IV SOLN
INTRAVENOUS | Status: AC
  Filled 2021-03-01: qty 50

## 2021-03-01 MED ORDER — PHENYLEPHRINE HCL (PRESSORS) 10 MG/ML IV SOLN
INTRAVENOUS | Status: DC | PRN
  Administered 2021-03-01: 100 ug via INTRAVENOUS
  Administered 2021-03-01 (×3): 200 ug via INTRAVENOUS
  Administered 2021-03-01: 100 ug via INTRAVENOUS

## 2021-03-01 MED ORDER — DEXAMETHASONE SODIUM PHOSPHATE 10 MG/ML IJ SOLN
INTRAMUSCULAR | Status: AC
  Filled 2021-03-01: qty 1

## 2021-03-01 MED ORDER — PROPOFOL 1000 MG/100ML IV EMUL
5.0000 ug/kg/min | INTRAVENOUS | Status: DC
  Administered 2021-03-01: 10 ug/kg/min via INTRAVENOUS
  Administered 2021-03-02: 20 ug/kg/min via INTRAVENOUS
  Administered 2021-03-02: 39.931 ug/kg/min via INTRAVENOUS
  Administered 2021-03-02: 40 ug/kg/min via INTRAVENOUS
  Administered 2021-03-03: 20 ug/kg/min via INTRAVENOUS
  Administered 2021-03-03: 25 ug/kg/min via INTRAVENOUS
  Administered 2021-03-04: 15 ug/kg/min via INTRAVENOUS
  Administered 2021-03-04: 25 ug/kg/min via INTRAVENOUS
  Administered 2021-03-04 – 2021-03-05 (×2): 15 ug/kg/min via INTRAVENOUS
  Filled 2021-03-01 (×10): qty 100

## 2021-03-01 MED ORDER — SODIUM CHLORIDE (PF) 0.9 % IJ SOLN
INTRAMUSCULAR | Status: AC
  Filled 2021-03-01: qty 20

## 2021-03-01 MED ORDER — ALBUMIN HUMAN 5 % IV SOLN: INTRAVENOUS | Status: DC | PRN

## 2021-03-01 MED ORDER — EPHEDRINE SULFATE 50 MG/ML IJ SOLN
INTRAMUSCULAR | Status: DC | PRN
  Administered 2021-03-01: 10 mg via INTRAVENOUS

## 2021-03-01 MED ORDER — EPHEDRINE 5 MG/ML INJ
INTRAVENOUS | Status: AC
  Filled 2021-03-01: qty 10

## 2021-03-01 MED ORDER — PROPOFOL 10 MG/ML IV BOLUS
INTRAVENOUS | Status: DC | PRN
  Administered 2021-03-01: 80 mg via INTRAVENOUS

## 2021-03-01 MED ORDER — ROCURONIUM BROMIDE 10 MG/ML (PF) SYRINGE
PREFILLED_SYRINGE | INTRAVENOUS | Status: AC
  Filled 2021-03-01: qty 10

## 2021-03-01 MED ORDER — SODIUM CHLORIDE 0.9 % IV SOLN
INTRAVENOUS | Status: DC | PRN
  Administered 2021-03-01: 20 ug/min via INTRAVENOUS

## 2021-03-01 MED ORDER — ESMOLOL HCL 100 MG/10ML IV SOLN
INTRAVENOUS | Status: DC | PRN
  Administered 2021-03-01: 30 mg via INTRAVENOUS
  Administered 2021-03-01 (×2): 20 mg via INTRAVENOUS
  Administered 2021-03-01: 30 mg via INTRAVENOUS

## 2021-03-01 MED ORDER — ALBUMIN HUMAN 25 % IV SOLN
25.0000 g | Freq: Once | INTRAVENOUS | Status: AC
  Administered 2021-03-01: 25 g via INTRAVENOUS
  Filled 2021-03-01: qty 100

## 2021-03-01 MED ORDER — PANTOPRAZOLE SODIUM 40 MG IV SOLR
40.0000 mg | Freq: Every day | INTRAVENOUS | Status: DC
  Administered 2021-03-01 – 2021-03-22 (×22): 40 mg via INTRAVENOUS
  Filled 2021-03-01 (×22): qty 40

## 2021-03-01 MED ORDER — SUCCINYLCHOLINE CHLORIDE 20 MG/ML IJ SOLN
INTRAMUSCULAR | Status: DC | PRN
  Administered 2021-03-01: 100 mg via INTRAVENOUS

## 2021-03-01 MED ORDER — CHLORHEXIDINE GLUCONATE 0.12% ORAL RINSE (MEDLINE KIT)
15.0000 mL | Freq: Two times a day (BID) | OROMUCOSAL | Status: DC
  Administered 2021-03-01 – 2021-04-03 (×62): 15 mL via OROMUCOSAL

## 2021-03-01 MED ORDER — PHENYLEPHRINE CONCENTRATED 100MG/250ML (0.4 MG/ML) INFUSION SIMPLE
0.0000 ug/min | INTRAVENOUS | Status: DC
  Administered 2021-03-01: 20 ug/min via INTRAVENOUS
  Administered 2021-03-02: 150 ug/min via INTRAVENOUS
  Administered 2021-03-04 – 2021-03-06 (×2): 20 ug/min via INTRAVENOUS
  Administered 2021-03-09: 45 ug/min via INTRAVENOUS
  Administered 2021-03-10: 65 ug/min via INTRAVENOUS
  Filled 2021-03-01 (×7): qty 250

## 2021-03-01 MED ORDER — VANCOMYCIN HCL 1250 MG/250ML IV SOLN
1250.0000 mg | Freq: Once | INTRAVENOUS | Status: AC
  Administered 2021-03-01: 1250 mg via INTRAVENOUS
  Filled 2021-03-01: qty 250

## 2021-03-01 MED ORDER — SUCCINYLCHOLINE CHLORIDE 200 MG/10ML IV SOSY
PREFILLED_SYRINGE | INTRAVENOUS | Status: AC
  Filled 2021-03-01: qty 10

## 2021-03-01 MED ORDER — ROCURONIUM BROMIDE 100 MG/10ML IV SOLN
INTRAVENOUS | Status: DC | PRN
  Administered 2021-03-01: 30 mg via INTRAVENOUS
  Administered 2021-03-01: 40 mg via INTRAVENOUS

## 2021-03-01 MED ORDER — VASOPRESSIN 20 UNIT/ML IV SOLN
INTRAVENOUS | Status: DC | PRN
  Administered 2021-03-01 (×2): 1 [IU] via INTRAVENOUS
  Administered 2021-03-01 (×2): 2 [IU] via INTRAVENOUS
  Administered 2021-03-01 (×5): 1 [IU] via INTRAVENOUS
  Administered 2021-03-01: 2 [IU] via INTRAVENOUS
  Administered 2021-03-01: 1 [IU] via INTRAVENOUS
  Administered 2021-03-01: 2 [IU] via INTRAVENOUS
  Administered 2021-03-01: 5 [IU] via INTRAVENOUS
  Administered 2021-03-01: 2 [IU] via INTRAVENOUS

## 2021-03-01 MED ORDER — ONDANSETRON HCL 4 MG/2ML IJ SOLN
INTRAMUSCULAR | Status: DC | PRN
  Administered 2021-03-01: 4 mg via INTRAVENOUS

## 2021-03-01 MED ORDER — INSULIN ASPART 100 UNIT/ML ~~LOC~~ SOLN
0.0000 [IU] | SUBCUTANEOUS | Status: DC
  Administered 2021-03-01: 3 [IU] via SUBCUTANEOUS
  Administered 2021-03-02 (×2): 2 [IU] via SUBCUTANEOUS
  Administered 2021-03-02 (×3): 3 [IU] via SUBCUTANEOUS
  Administered 2021-03-03: 2 [IU] via SUBCUTANEOUS
  Administered 2021-03-03: 3 [IU] via SUBCUTANEOUS
  Administered 2021-03-03 (×3): 2 [IU] via SUBCUTANEOUS
  Administered 2021-03-04 (×3): 3 [IU] via SUBCUTANEOUS
  Administered 2021-03-04 (×2): 2 [IU] via SUBCUTANEOUS
  Administered 2021-03-04 – 2021-03-05 (×6): 3 [IU] via SUBCUTANEOUS
  Administered 2021-03-05: 2 [IU] via SUBCUTANEOUS
  Administered 2021-03-06 (×2): 3 [IU] via SUBCUTANEOUS
  Administered 2021-03-06: 5 [IU] via SUBCUTANEOUS
  Administered 2021-03-06 (×3): 3 [IU] via SUBCUTANEOUS
  Administered 2021-03-06: 5 [IU] via SUBCUTANEOUS
  Administered 2021-03-07 (×3): 2 [IU] via SUBCUTANEOUS
  Administered 2021-03-08: 3 [IU] via SUBCUTANEOUS
  Administered 2021-03-08 (×2): 2 [IU] via SUBCUTANEOUS
  Administered 2021-03-09: 3 [IU] via SUBCUTANEOUS
  Administered 2021-03-09 – 2021-03-10 (×4): 2 [IU] via SUBCUTANEOUS
  Administered 2021-03-10 (×2): 3 [IU] via SUBCUTANEOUS
  Administered 2021-03-10: 2 [IU] via SUBCUTANEOUS
  Administered 2021-03-10: 3 [IU] via SUBCUTANEOUS
  Administered 2021-03-11: 8 [IU] via SUBCUTANEOUS
  Administered 2021-03-11 (×2): 3 [IU] via SUBCUTANEOUS
  Administered 2021-03-11: 8 [IU] via SUBCUTANEOUS
  Administered 2021-03-11: 2 [IU] via SUBCUTANEOUS
  Filled 2021-03-01 (×50): qty 1

## 2021-03-01 MED ORDER — LIDOCAINE HCL (CARDIAC) PF 100 MG/5ML IV SOSY
PREFILLED_SYRINGE | INTRAVENOUS | Status: DC | PRN
  Administered 2021-03-01: 60 mg via INTRAVENOUS

## 2021-03-01 MED ORDER — POLYETHYLENE GLYCOL 3350 17 G PO PACK
17.0000 g | PACK | Freq: Every day | ORAL | Status: DC
  Administered 2021-03-02 – 2021-03-22 (×11): 17 g
  Filled 2021-03-01 (×14): qty 1

## 2021-03-01 MED ORDER — PROPOFOL 500 MG/50ML IV EMUL
INTRAVENOUS | Status: AC
  Filled 2021-03-01: qty 50

## 2021-03-01 MED ORDER — BUPIVACAINE-EPINEPHRINE (PF) 0.25% -1:200000 IJ SOLN
INTRAMUSCULAR | Status: AC
  Filled 2021-03-01: qty 30

## 2021-03-01 MED ORDER — ACETAMINOPHEN 10 MG/ML IV SOLN
INTRAVENOUS | Status: AC
  Filled 2021-03-01: qty 100

## 2021-03-01 MED ORDER — FENTANYL CITRATE (PF) 100 MCG/2ML IJ SOLN
INTRAMUSCULAR | Status: DC | PRN
  Administered 2021-03-01 (×2): 25 ug via INTRAVENOUS

## 2021-03-01 SURGICAL SUPPLY — 48 items
APL PRP STRL LF DISP 70% ISPRP (MISCELLANEOUS) ×1
BLADE CLIPPER SURG (BLADE) IMPLANT
BULB RESERV EVAC DRAIN JP 100C (MISCELLANEOUS) ×2 IMPLANT
CATH FOL 2WAY LX 20X5 (CATHETERS) ×2 IMPLANT
CHLORAPREP W/TINT 26 (MISCELLANEOUS) ×2 IMPLANT
CLIP VESOCCLUDE LG 6/CT (CLIP) IMPLANT
CLIP VESOCCLUDE MED 6/CT (CLIP) IMPLANT
COVER WAND RF STERILE (DRAPES) ×2 IMPLANT
DRAIN CHANNEL JP 19F (MISCELLANEOUS) ×2 IMPLANT
DRAPE INCISE IOBAN 66X45 STRL (DRAPES) ×2 IMPLANT
DRSG OPSITE POSTOP 4X10 (GAUZE/BANDAGES/DRESSINGS) IMPLANT
DRSG OPSITE POSTOP 4X8 (GAUZE/BANDAGES/DRESSINGS) IMPLANT
ELECT CAUTERY BLADE 6.4 (BLADE) ×2 IMPLANT
ELECT CAUTERY BLADE TIP 2.5 (TIP) ×2
ELECT EZSTD 165MM 6.5IN (MISCELLANEOUS) ×2
ELECT REM PT RETURN 9FT ADLT (ELECTROSURGICAL) ×2
ELECTRODE CAUTERY BLDE TIP 2.5 (TIP) ×1 IMPLANT
ELECTRODE EZSTD 165MM 6.5IN (MISCELLANEOUS) ×1 IMPLANT
ELECTRODE REM PT RTRN 9FT ADLT (ELECTROSURGICAL) ×1 IMPLANT
GAUZE PACKING IODOFORM 1X5 (PACKING) ×2 IMPLANT
GLOVE ORTHO TXT STRL SZ7.5 (GLOVE) ×8 IMPLANT
GOWN STRL REUS W/ TWL LRG LVL3 (GOWN DISPOSABLE) ×6 IMPLANT
GOWN STRL REUS W/TWL LRG LVL3 (GOWN DISPOSABLE) ×12
KIT OSTOMY 2 PC DRNBL 2.25 STR (WOUND CARE) ×1 IMPLANT
KIT OSTOMY DRAINABLE 2.25 STR (WOUND CARE) ×2
KIT TURNOVER KIT A (KITS) ×2 IMPLANT
LIGASURE IMPACT 36 18CM CVD LR (INSTRUMENTS) ×2 IMPLANT
MANIFOLD NEPTUNE II (INSTRUMENTS) ×2 IMPLANT
NS IRRIG 1000ML POUR BTL (IV SOLUTION) ×2 IMPLANT
PACK BASIN MAJOR ARMC (MISCELLANEOUS) ×2 IMPLANT
PACK COLON CLEAN CLOSURE (MISCELLANEOUS) ×2 IMPLANT
PAD ABD DERMACEA PRESS 5X9 (GAUZE/BANDAGES/DRESSINGS) ×2 IMPLANT
RELOAD PROXIMATE 75MM BLUE (ENDOMECHANICALS) ×2 IMPLANT
RELOAD STAPLER LINEAR PROX 30 (STAPLE) ×1 IMPLANT
SPONGE LAP 18X18 RF (DISPOSABLE) ×4 IMPLANT
STAPLER GUN LINEAR PROX 60 (STAPLE) IMPLANT
STAPLER PROXIMATE 75MM BLUE (STAPLE) ×2 IMPLANT
STAPLER RELOAD LINEAR PROX 30 (STAPLE) ×2
STAPLER SKIN PROX 35W (STAPLE) ×2 IMPLANT
SUT CHROMIC 2 0 SH (SUTURE) IMPLANT
SUT PDS AB 0 CT1 27 (SUTURE) ×4 IMPLANT
SUT SILK 2 0 (SUTURE) ×2
SUT SILK 2-0 30XBRD TIE 12 (SUTURE) ×1 IMPLANT
SUT SILK 3-0 (SUTURE) ×2 IMPLANT
SUT VIC AB 3-0 SH 27 (SUTURE) ×4
SUT VIC AB 3-0 SH 27X BRD (SUTURE) ×2 IMPLANT
SYR BULB IRRIG 60ML STRL (SYRINGE) ×2 IMPLANT
TRAY FOLEY SLVR 16FR LF STAT (SET/KITS/TRAYS/PACK) ×2 IMPLANT

## 2021-03-01 NOTE — Procedures (Signed)
Arterial Catheter Insertion Procedure Note  Desiree Madden  [id number]  [birth year]  Date:03/01/21  Time:8:17 PM    Provider Performing: [name]    Procedure: Insertion of Arterial Line [phone]) with US guidance ([id number])   Indication(s) Blood pressure monitoring and/or need for frequent ABGs  Consent Unable to obtain consent due to emergent nature of procedure.  Anesthesia None   Time Out Verified patient identification, verified procedure, site/side was marked, verified correct patient position, special equipment/implants available, medications/allergies/relevant history reviewed, required imaging and test results available.   Sterile Technique Maximal sterile technique including full sterile barrier drape, hand hygiene, sterile gown, sterile gloves, mask, hair covering, sterile ultrasound probe cover (if used).   Procedure Description Area of catheter insertion was cleaned with chlorhexidine and draped in sterile fashion. With real-time ultrasound guidance an arterial catheter was placed into the left femoral artery.  Appropriate arterial tracings confirmed on monitor.     Complications/Tolerance None; patient tolerated the procedure well.   EBL Minimal   Specimen(s) None   BIOPATCH applied to the insertion site.    Desiree Madden, AGACNP-BC [facility] Pager: [phone]

## 2021-03-01 NOTE — Plan of Care (Signed)
Patients WBC going up and patient still having pain.  Will plan for surgery this afternoon.

## 2021-03-01 NOTE — Progress Notes (Addendum)
[facility] SURGICAL PROGRESS NOTE (cpt [phone])  Hospital Day(s): 8.   Interval History: Patient seen and examined, no acute events or new complaints overnight. Patient reports she is actually feeling better this morning. She reports her abdominal pain is improved. She denies fever, chills, nausea, emesis. Her leukocytosis continues to worsen, now up to 28.2K. She is afebrile but slightly more hypotensive in the last few hours (90/60s). Renal function has remained near her baseline, sCr - 1.63, and slight increase may be secondary to IV contrast yesterday. She does have hypokalemia to 2.7 this morning and mild hyperphosphatemia to 5.2. She did undergo percutaneous drain placement yesterday (03/23) with IR. Output recorded at 135 ccs in last 24 hours. Cx from this are growing gram positive cocci, gram positive rods, and gram variable rods. She continues on Zosyn  Review of Systems:  Constitutional: denies fever, chills  HEENT: denies cough or congestion  Respiratory: denies any shortness of breath  Cardiovascular: denies chest pain or palpitations  Gastrointestinal: + abdominal pain (improved), denied N/V, or diarrhea/and bowel function as per interval history Genitourinary: denies burning with urination or urinary frequency  Vital signs in last 24 hours: [min-max] current  Temp:  [97.6 F (36.4 C)-98.7 F (37.1 C)] 97.7 F (36.5 C) (03/24 0422) Pulse Rate:  [67-107] 78 (03/24 0422) Resp:  [14-31] 14 (03/24 0422) BP: (88-129)/(37-107) 93/55 (03/24 0422) SpO2:  [95 %-100 %] 98 % (03/24 0422) Weight:  [67.2 kg] 67.2 kg (03/23 1014)     Height: $Remove'5\' 4"'kDejyoq$  (162.6 cm) Weight: 67.2 kg BMI (Calculated): 25.43   Intake/Output last 2 shifts:  03/23 0701 - 03/24 0700 In: 50.1 [IV Piggyback:50.1] Out: 135 [Drains:135]   Physical Exam:  Constitutional: alert, cooperative and no distress  HENT: normocephalic without obvious abnormality  Eyes: PERRL, EOM's grossly intact and symmetric   Respiratory: breathing non-labored at rest  Cardiovascular: regular rate and sinus rhythm  Gastrointestinal: Soft, tenderness in the LLQ is improved compared to prior examinations, non-distended, no rebound/guarding, no peritonitis. Now with percutaneous drain in left abdomen, output seropurulent Musculoskeletal: no edema or wounds, motor and sensation grossly intact, NT   Labs:  CBC Latest Ref Rng & Units 03/01/2021 02/28/2021 02/27/2021  WBC 4.0 - 10.5 K/uL 28.2(H) 21.2(H) 17.5(H)  Hemoglobin 12.0 - 15.0 g/dL 11.2(L) 9.9(L) 9.6(L)  Hematocrit 36.0 - 46.0 % 32.2(L) 28.5(L) 27.8(L)  Platelets 150 - 400 K/uL 334 231 279   CMP Latest Ref Rng & Units 03/01/2021 02/28/2021 02/25/2021  Glucose 70 - 99 mg/dL 124(H) 88 123(H)  BUN 8 - 23 mg/dL 30(H) 29(H) 35(H)  Creatinine 0.44 - 1.00 mg/dL 1.63(H) 1.49(H) 1.57(H)  Sodium 135 - 145 mmol/L 137 140 133(L)  Potassium 3.5 - 5.1 mmol/L 2.7(LL) 3.2(L) 3.5  Chloride 98 - 111 mmol/L 110 115(H) 110  CO2 22 - 32 mmol/L 15(L) 17(L) 16(L)  Calcium 8.9 - 10.3 mg/dL 7.5(L) 7.7(L) 7.9(L)  Total Protein 6.5 - 8.1 g/dL - - -  Total Bilirubin 0.3 - 1.2 mg/dL - - -  Alkaline Phos 38 - 126 U/L - - -  AST 15 - 41 U/L - - -  ALT 0 - 44 U/L - - -    Imaging studies: No new pertinent imaging studies   Assessment/Plan: (ICD-10's: K57.92) 70 y.o.femalewith now worsening leukocytosis and persistent, but improved slightly, abdominal painadmitted with diverticulitis now with abscess s/p percutaneous drainage on 10/27, complicated by what sounds like a history of long standing colovesical fistula.   - Unfortunately, she  continues to have worsening of her leukocytosis this morning despite attempts at drain placement. At this point, I feel the conservative management is becoming futile. We discussed role for Hartman's Procedure. She is in agreement with proceeding.   - All risks, benefits, and alternatives to above procedure(s) were discussed with the patient, all of  her questions were answered to her expressed satisfaction, patient expresses she wishes to proceed, and informed consent was obtained  - NPO  - Continue IVF support  - Replete K+; monitor  - Monitor and record drain output  - Continue IV Abx (Zosyn); follow up drain Cx - Monitor abdominal examination - Pain control prn; antiemetics prn - Monitor leukocytosis; worsening significantly this morning; may be associated with transient worsening after procedure yesterday - Mobilization as tolerated -Further management per primary service; we will follow    All of the above findings and recommendations were discussed with the patient, and the medical team, and all of patient's questions were answered to her expressed satisfaction.  -- Edison Simon, PA-C  [facility] 03/01/2021, 7:29 AM [phone] M-F: 7am - 4pm

## 2021-03-01 NOTE — Anesthesia Preprocedure Evaluation (Signed)
Anesthesia Evaluation  Patient identified by MRN, date of birth, ID band Patient awake    Reviewed: Allergy & Precautions, H&P , NPO status , Patient's Chart, lab work & pertinent test results, reviewed documented beta blocker date and time   Airway Mallampati: III  TM Distance: >3 FB Neck ROM: full    Dental  (+) Teeth Intact, Poor Dentition, Missing, Chipped   Pulmonary COPD, former smoker,    Pulmonary exam normal        Cardiovascular Exercise Tolerance: Poor hypertension, On Medications negative cardio ROS Normal cardiovascular exam Rhythm:regular Rate:Normal     Neuro/Psych negative neurological ROS  negative psych ROS   GI/Hepatic Neg liver ROS, PUD,   Endo/Other  Hypothyroidism   Renal/GU CRFRenal disease  negative genitourinary   Musculoskeletal   Abdominal   Peds  Hematology negative hematology ROS (+)   Anesthesia Other Findings Past Medical History: No date: Hypertension No date: Thyroid disease Past Surgical History: No date: CARDIAC SURGERY BMI    Body Mass Index: 25.43 kg/m     Reproductive/Obstetrics negative OB ROS                             Anesthesia Physical Anesthesia Plan  ASA: III and emergent  Anesthesia Plan: General ETT   Post-op Pain Management:    Induction:   PONV Risk Score and Plan: 4 or greater  Airway Management Planned:   Additional Equipment:   Intra-op Plan:   Post-operative Plan:   Informed Consent: I have reviewed the patients History and Physical, chart, labs and discussed the procedure including the risks, benefits and alternatives for the proposed anesthesia with the patient or authorized representative who has indicated his/her understanding and acceptance.     Dental Advisory Given  Plan Discussed with: CRNA  Anesthesia Plan Comments:         Anesthesia Quick Evaluation

## 2021-03-01 NOTE — Transfer of Care (Signed)
Immediate Anesthesia Transfer of Care Note  Patient: Desiree Madden  Procedure(s) Performed: COLECTOMY WITH COLOSTOMY CREATION/HARTMANN PROCEDURE (Left )  Patient Location: ICU  Anesthesia Type:General  Level of Consciousness: Patient remains intubated per anesthesia plan  Airway & Oxygen Therapy: Patient remains intubated per anesthesia plan and Patient placed on Ventilator (see vital sign flow sheet for setting)  Post-op Assessment: Report given to RN and Post -op Vital signs reviewed and unstable, Anesthesiologist notified  Post vital signs: Reviewed and unstable  Last Vitals:  Vitals Value Taken Time  BP 97/37 03/01/21 1909  Temp    Pulse 99 03/01/21 1901  Resp 36 03/01/21 1909  SpO2 93 % 03/01/21 1901  Vitals shown include unvalidated device data.  Last Pain:  Vitals:   03/01/21 1511  TempSrc: Tympanic  PainSc: 6       Patients Stated Pain Goal: 3 (84/21/03 1281)  Complications: Pt hypotensive. Notified attending anesthesiologist and ICU attending. ICU team treating and managing patient with fluids and vasopressor medications

## 2021-03-01 NOTE — Op Note (Signed)
Date of procedure: 03/01/21  Preoperative diagnosis:  1. Colovesical fistula 2. Diverticulitis/pelvic abscess  Postoperative diagnosis:  1. Same  Procedure: 1. Cystorrhaphy  Surgeon: Nickolas Madrid, MD  Anesthesia: General  Complications: None  Intraoperative findings:  1.  1 cm fistula at the dome of the bladder, repaired in 2 layers, watertight  EBL: Minimal for urology portion  Specimens: None for urology portion  Drains: 5 [place] two-way Foley  Indication: Desiree Madden is a 70 y.o. patient with a known colovesical fistula and diverticulitis and pelvic abscess with worsening leukocytosis despite drain placement by IR.  She was taken to the OR this afternoon by Dr. Christian Mate of general surgery for washout and bowel resection.  She was found to have a 1 cm fistula at the dome of the bladder, and urology was consulted Intra-Op for evaluation and closure.  I reviewed her pre-op CT showing extensive diverticulitis and abscess in the pelvis with gas in the bladder, and no definitive hydronephrosis.  Description of procedure:  When I arrived the patient was in supine position with a midline incision and excellent exposure.  There appeared to be a 1 cm fistula at the dome of the bladder, and when instilling methylene blue via her 70 [place] Foley this clearly could be seen extravasating into the surgical field.  I started by opening the space of Retzius and mobilizing the bladder.  There was excellent exposure, and a 2-0 Vicryl on a UR-6 was used to close the mucosal layer of the bladder in running fashion.  The bladder was tested again with ~259mL of methylene blue dyed saline, and no leak was noted.  The tissue in the pelvis was quite fibrotic and rind-like in the setting of her acute diverticulitis and abscess, however I was able to mobilize a flap of serosa and use interrupted 2-0 Vicryl sutures to reinforce our closure with a second layer.  With the extensive inflammation and  fibrosis in the pelvis, I elected not to dissect further to try to identify each ureter.  The fistula appeared to be at the dome of the bladder well away from anticipated insertion of the ureters posteriorly.  The case was then turned back over to general surgery for stoma maturation.  I requested the RN upsize the catheter at the conclusion of the case to a 20 [place] two-way Foley.  Disposition: Continue with general surgery portion of case  Plan: Maintain Foley 2 weeks We will arrange follow-up in clinic in 2 weeks with cystogram prior and likely Foley removal at that time  Nickolas Madrid, MD

## 2021-03-01 NOTE — Progress Notes (Signed)
PROGRESS NOTE    Desiree Madden  TGU:[id number] DOB: [birth year] DOA: 02/21/2021 PCP: [facility]   Chief complaint.  Abdominal pain. Brief Narrative:  Desiree Madden a 70 year old female with ulcerative colitis, diverticulosis with colovesical fistula who presented to the hospital with severe abdominal pain and burning urination, nausea and vomiting She has been on and off antibiotics over the past few months for recurrent urinary tract infections related to the fistula.  CT scan in the ED reveals possible diverticulitis and also a large amount of stool in the left colon.UA consistent with a UTI. She was started on ciprofloxacin and Flagyl and changed to Zosyn She had IR drain of intra-abdominal abscess on 3/23.  Assessment & Plan:   Principal Problem:   Diverticulitis Active Problems:   Fistula, bladder   Ulcerative colitis (HCC)   Hyperlipidemia   Essential hypertension   Hypothyroidism   Abnormal ECG   CKD (chronic kidney disease), stage III (HCC)   Elevated lactic acid level   #1.  Leukocytosis with a lactic acidosis secondary to diverticulitis and UTI. Acute diverticulitis abdominal abscess. Enterococcus urinary tract infection secondary to colovesical fistula. Sepsis. Not POA.  Patient had a worsening leukocytosis today, she had a IR drain yesterday.    Surgery decided to take her to the OR for colectomy. Continue antibiotics with Zosyn.  2.  Severe constipation Condition improved  3.  Chronic kidney disease stage IIIb. Hypokalemia. Continue to follow function. Replete potassium via IV.  4.  Essential hypertension Continue metoprolol.  5.  1 episode of nonsustained supraventricular tachycardia and ventricular tachycardia. No recurrence.    DVT prophylaxis: Lovenox Code Status: Full Family Communication:  Disposition Plan:  .   Status is: Inpatient  Remains inpatient appropriate because:Inpatient level of care appropriate due to  severity of illness   Dispo: The patient is from: Home              Anticipated d/c is to: Home              Patient currently is not medically stable to d/c.   Difficult to place patient No        I/O last 3 completed shifts: In: 1600.8 [P.O.:200; I.V.:300; IV Piggyback:1100.8] Out: 135 [Drains:135] No intake/output data recorded.     Consultants:   General surgery  Procedures: IR drain of abscess  Antimicrobials: Zosyn.  Subjective: Patient did not have any fever for the last 24 hours.  No chills. Abdominal pain much better today, no nausea vomiting.  Constipation resolved Denies any short of breath or cough. No dysuria hematuria.  Objective: Vitals:   03/01/21 0422 03/01/21 0754 03/01/21 0800 03/01/21 1129  BP: (!) 93/55 (!) 122/53 (!) 110/57 (!) 109/58  Pulse: 78 60 93 85  Resp: 14 16 18 16   Temp: 97.7 F (36.5 C) 97.9 F (36.6 C) 97.6 F (36.4 C) 97.6 F (36.4 C)  TempSrc: Oral Oral Oral Oral  SpO2: 98% 100% 98% 94%  Weight:      Height:        Intake/Output Summary (Last 24 hours) at 03/01/2021 1304 Last data filed at 03/01/2021 0904 Gross per 24 hour  Intake 50.1 ml  Output 135 ml  Net -84.9 ml   Filed Weights   02/27/21 0400 02/27/21 0900 02/28/21 1014  Weight: 67.4 kg 66.9 kg 67.2 kg    Examination:  General exam: Appears calm and comfortable  Respiratory system: Clear to auscultation. Respiratory effort normal. Cardiovascular system: S1 & S2 heard,  RRR. No JVD, murmurs, rubs, gallops or clicks. No pedal edema. Gastrointestinal system: Abdomen is nondistended, soft and nontender. No organomegaly or masses felt. Normal bowel sounds heard. Central nervous system: Alert and oriented. No focal neurological deficits. Extremities: Symmetric 5 x 5 power. Skin: No rashes, lesions or ulcers Psychiatry: Judgement and insight appear normal. Mood & affect appropriate.     Data Reviewed: I have personally reviewed following labs and imaging  studies  CBC: Recent Labs  Lab 02/25/21 0803 02/26/21 0823 02/27/21 0425 02/28/21 0613 03/01/21 0622  WBC 24.5* 22.0* 17.5* 21.2* 28.2*  HGB 10.8* 10.9* 9.6* 9.9* 11.2*  HCT 32.3* 32.0* 27.8* 28.5* 32.2*  MCV 89.5 88.4 86.6 86.6 85.4  PLT 276 289 279 231 811   Basic Metabolic Panel: Recent Labs  Lab 02/23/21 0816 02/24/21 0552 02/25/21 0803 02/28/21 0613 03/01/21 0622  NA 138 136 133* 140 137  K 3.7 4.0 3.5 3.2* 2.7*  CL 114* 114* 110 115* 110  CO2 18* 17* 16* 17* 15*  GLUCOSE 148* 120* 123* 88 124*  BUN 24* 31* 35* 29* 30*  CREATININE 1.19* 1.50* 1.57* 1.49* 1.63*  CALCIUM 8.2* 8.3* 7.9* 7.7* 7.5*  MG  --   --   --   --  1.9  PHOS  --   --   --   --  5.2*   GFR: Estimated Creatinine Clearance: 30.3 mL/min (A) (by C-G formula based on SCr of 1.63 mg/dL (H)). Liver Function Tests: No results for input(s): AST, ALT, ALKPHOS, BILITOT, PROT, ALBUMIN in the last 168 hours. No results for input(s): LIPASE, AMYLASE in the last 168 hours. No results for input(s): AMMONIA in the last 168 hours. Coagulation Profile: No results for input(s): INR, PROTIME in the last 168 hours. Cardiac Enzymes: No results for input(s): CKTOTAL, CKMB, CKMBINDEX, TROPONINI in the last 168 hours. BNP (last 3 results) No results for input(s): PROBNP in the last 8760 hours. HbA1C: No results for input(s): HGBA1C in the last 72 hours. CBG: Recent Labs  Lab 03/01/21 0737  GLUCAP 112*   Lipid Profile: No results for input(s): CHOL, HDL, LDLCALC, TRIG, CHOLHDL, LDLDIRECT in the last 72 hours. Thyroid Function Tests: No results for input(s): TSH, T4TOTAL, FREET4, T3FREE, THYROIDAB in the last 72 hours. Anemia Panel: No results for input(s): VITAMINB12, FOLATE, FERRITIN, TIBC, IRON, RETICCTPCT in the last 72 hours. Sepsis Labs: Recent Labs  Lab 02/23/21 0824 02/24/21 0552  LATICACIDVEN 2.1* 2.2*    Recent Results (from the past 240 hour(s))  Urine culture     Status: Abnormal    Collection Time: 02/21/21  3:43 PM   Specimen: Urine, Random  Result Value Ref Range Status   Specimen Description   Final    URINE, RANDOM Performed at [facility], [address], [place], [place] [address]    Special Requests   Final    NONE Performed at [facility], [address] [place], [place] [address]    Culture (A)  Final    >=100,000 COLONIES/mL ENTEROCOCCUS FAECIUM >=100,000 COLONIES/mL ENTEROCOCCUS FAECALIS    Report Status 02/24/2021 FINAL  Final   Organism ID, Bacteria ENTEROCOCCUS FAECIUM (A)  Final   Organism ID, Bacteria ENTEROCOCCUS FAECALIS (A)  Final      Susceptibility   Enterococcus faecalis - MIC*    AMPICILLIN <=2 SENSITIVE Sensitive     NITROFURANTOIN <=16 SENSITIVE Sensitive     VANCOMYCIN 2 SENSITIVE Sensitive     * >=100,000 COLONIES/mL ENTEROCOCCUS FAECALIS  Enterococcus faecium - MIC*    AMPICILLIN <=2 SENSITIVE Sensitive     NITROFURANTOIN 64 INTERMEDIATE Intermediate     VANCOMYCIN <=0.5 SENSITIVE Sensitive     * >=100,000 COLONIES/mL ENTEROCOCCUS FAECIUM  Resp Panel by RT-PCR (Flu A&B, Covid) Nasopharyngeal Swab     Status: None   Collection Time: 02/21/21  7:36 PM   Specimen: Nasopharyngeal Swab; Nasopharyngeal(NP) swabs in vial transport medium  Result Value Ref Range Status   SARS Coronavirus 2 by RT PCR NEGATIVE NEGATIVE Final    Comment: (NOTE) SARS-CoV-2 target nucleic acids are NOT DETECTED.  The SARS-CoV-2 RNA is generally detectable in upper respiratory specimens during the acute phase of infection. The lowest concentration of SARS-CoV-2 viral copies this assay can detect is 138 copies/mL. A negative result does not preclude SARS-Cov-2 infection and should not be used as the sole basis for treatment or other patient management decisions. A negative result may occur with  improper specimen collection/handling, submission of specimen other than nasopharyngeal swab, presence of viral mutation(s) within  the areas targeted by this assay, and inadequate number of viral copies(<138 copies/mL). A negative result must be combined with clinical observations, patient history, and epidemiological information. The expected result is Negative.  Fact Sheet for Patients:  EntrepreneurPulse.com.au  Fact Sheet for Healthcare Providers:  IncredibleEmployment.be  This test is no t yet approved or cleared by the [place] FDA and  has been authorized for detection and/or diagnosis of SARS-CoV-2 by FDA under an Emergency Use Authorization (EUA). This EUA will remain  in effect (meaning this test can be used) for the duration of the COVID-19 declaration under Section 564(b)(1) of the Act, 21 U.S.C.section 360bbb-3(b)(1), unless the authorization is terminated  or revoked sooner.       Influenza A by PCR NEGATIVE NEGATIVE Final   Influenza B by PCR NEGATIVE NEGATIVE Final    Comment: (NOTE) The Xpert Xpress SARS-CoV-2/FLU/RSV plus assay is intended as an aid in the diagnosis of influenza from Nasopharyngeal swab specimens and should not be used as a sole basis for treatment. Nasal washings and aspirates are unacceptable for Xpert Xpress SARS-CoV-2/FLU/RSV testing.  Fact Sheet for Patients: EntrepreneurPulse.com.au  Fact Sheet for Healthcare Providers: IncredibleEmployment.be  This test is not yet approved or cleared by the [place] FDA and has been authorized for detection and/or diagnosis of SARS-CoV-2 by FDA under an Emergency Use Authorization (EUA). This EUA will remain in effect (meaning this test can be used) for the duration of the COVID-19 declaration under Section 564(b)(1) of the Act, 21 U.S.C. section 360bbb-3(b)(1), unless the authorization is terminated or revoked.  Performed at [facility], [address] [place], [place] [address]   Aerobic/Anaerobic Culture (surgical/deep wound)      Status: None (Preliminary result)   Collection Time: 02/28/21  2:49 PM   Specimen: Abdomen; Abscess  Result Value Ref Range Status   Specimen Description   Final    ABDOMEN Performed at [facility], [address] [place], [place] [address]    Special Requests   Final    NONE Performed at [facility], [place], [place] [address]    Gram Stain   Final    RARE WBC PRESENT, PREDOMINANTLY PMN MODERATE GRAM POSITIVE COCCI FEW GRAM POSITIVE RODS FEW GRAM VARIABLE ROD Performed at [facility], [place] [address] [place], [place] [address]    Culture PENDING  Incomplete   Report Status PENDING  Incomplete  Surgical pcr screen  Status: None   Collection Time: 03/01/21 10:21 AM   Specimen: Nasal Mucosa; Nasal Swab  Result Value Ref Range Status   MRSA, PCR NEGATIVE NEGATIVE Final   Staphylococcus aureus NEGATIVE NEGATIVE Final    Comment: (NOTE) The Xpert SA Assay (FDA approved for NASAL specimens in patients 59 years of age and older), is one component of a comprehensive surveillance program. It is not intended to diagnose infection nor to guide or monitor treatment. Performed at [facility], [address], [place], [place] [address]          Radiology Studies: CT ABDOMEN PELVIS W CONTRAST  Result Date: 02/28/2021 CLINICAL DATA:  Left lower quadrant abdominal pain. History diverticulitis. EXAM: CT ABDOMEN AND PELVIS WITH CONTRAST TECHNIQUE: Multidetector CT imaging of the abdomen and pelvis was performed using the standard protocol following bolus administration of intravenous contrast. CONTRAST:  50mL OMNIPAQUE IOHEXOL 300 MG/ML  SOLN COMPARISON:  CT scan 02/24/2021 FINDINGS: Lower chest: Margins persistent small bilateral pleural effusions with overlying atelectasis. The heart is normal in size. No pericardial effusion. Stable aortic and advanced coronary artery calcifications. Surgical changes from coronary artery bypass  surgery. The distal esophagus is grossly normal. Hepatobiliary: No biliary no hepatic lesions or intrahepatic biliary dilatation. The gallbladder is unremarkable. Persistent perihepatic fluid. No common bile duct dilatation. Pancreas: No mass, inflammation or ductal dilatation. Spleen: Normal size.  No focal lesions. Adrenals/Urinary Tract: The adrenal glands are normal. No renal lesions or hydroureteronephrosis. There is a large amount of gas in the bladder most likely from recent catheterization. Stomach/Bowel: The stomach, duodenum and small bowel are unremarkable. No acute inflammatory process or obstructive findings. Contrast gets all the way to the colon. Diffuse scattered colonic diverticulosis. Areas of colonic inflammation noted involving the descending colon and almost the entire sigmoid colon with marked wall thickening and submucosal edema. There is a new pericolonic abscess with a possible intramural component but definite extramural component in the left pelvis measuring a maximum of 8.3 x 4.7 cm. It appears to contain a small amount of dependent contrast material suggesting active extravasation. Vascular/Lymphatic: Stable advanced atherosclerotic calcifications involving the aorta and iliac arteries. No aneurysm. The major venous structures are patent. There are numerous scattered borderline retroperitoneal lymph nodes which are likely inflammatory/reactive. Reproductive: The uterus and ovaries are grossly normal in stable. Other: Diffuse subcutaneous soft tissue swelling/edema suggesting anasarca. There is also scattered free abdominal and free pelvic fluid. Musculoskeletal: If no significant bony findings. IMPRESSION: 1. Persistent changes of acute severe diverticulitis involving the descending colon and almost the entire sigmoid colon. 2. New pericolonic abscess measuring a maximum of 8.3 x 4.7 cm. It appears to contain a small amount of dependent contrast material suggesting active extravasation.  3. Persistent small bilateral pleural effusions with overlying atelectasis. 4. Persistent perihepatic and free pelvic fluid. 5. Diffuse subcutaneous soft tissue swelling/edema suggesting anasarca. 6. Stable advanced atherosclerotic calcifications involving the aorta and iliac arteries. 7. Large amount of gas in the bladder most likely from recent catheterization. 8. Aortic atherosclerosis. These results will be called to the ordering clinician or representative by the Radiologist Assistant, and communication documented in the PACS or [employer]. Aortic Atherosclerosis (ICD10-I70.0). Electronically Signed   By: Marijo Sanes M.D.   On: 02/28/2021 13:43   CT IMAGE GUIDED DRAINAGE BY PERCUTANEOUS CATHETER  Result Date: 02/28/2021 INDICATION: 70 year old female with a history of diverticular abscess. Known colovesical fistula. She presents for drainage of the abscess EXAM: CT GUIDED DRAINAGE OF  ABSCESS  MEDICATIONS: The patient is currently admitted to the hospital and receiving intravenous antibiotics. The antibiotics were administered within an appropriate time frame prior to the initiation of the procedure. ANESTHESIA/SEDATION: 1.0 mg IV Versed 50 mcg IV Fentanyl Moderate Sedation Time:  11 minutes The patient was continuously monitored during the procedure by the interventional radiology nurse under my direct supervision. COMPLICATIONS: None TECHNIQUE: Informed written consent was obtained from the patient after a thorough discussion of the procedural risks, benefits and alternatives. All questions were addressed. Maximal Sterile Barrier Technique was utilized including caps, mask, sterile gowns, sterile gloves, sterile drape, hand hygiene and skin antiseptic. A timeout was performed prior to the initiation of the procedure. PROCEDURE: The left lower quadrant was prepped with chlorhexidine in a sterile fashion, and a sterile drape was applied covering the operative field. A sterile gown and sterile gloves  were used for the procedure. Local anesthesia was provided with 1% Lidocaine. Once the patient was prepped and draped and anesthetized with 1% lidocaine, CT guidance was used to place a 18 gauge trocar needle into the abscess of the left lower quadrant. Modified Seldinger technique was then used to place a 12 [place] drain. Approximately 100 cc of thin amber fluid aspirated for a culture. Drain was attached to bulb suction and sutured in position. Patient tolerated the procedure well and remained hemodynamically stable throughout. No complications were encountered and no significant blood loss. FINDINGS: Final image demonstrates pigtail drainage catheter in place within the abscess of the left lower quadrant. The only aspirate achieved was thin amber fluid. There is persisting flocculent material around the drain at the completion. This may represent frank contamination of the peritoneum with fecal material. Approximately 100 cc of fluid aspirated with a sample sent for culture. IMPRESSION: Status post CT-guided drainage of left lower quadrant abscess Signed, Dulcy Fanny. Dellia Nims, RPVI [facility] Radiology Electronically Signed   By: Corrie Mckusick D.O.   On: 02/28/2021 15:13        Scheduled Meds: . atorvastatin  20 mg Oral Daily  . chlorhexidine  15 mL Mouth Rinse BID  . enoxaparin (LOVENOX) injection  40 mg Subcutaneous Q24H  . feeding supplement  237 mL Oral TID BM  . fluticasone  2 spray Each Nare Daily  . lactulose  20 g Oral Once  . levothyroxine  75 mcg Oral Q0600  . mouth rinse  15 mL Mouth Rinse q12n4p  . metoprolol succinate  50 mg Oral Daily  . multivitamin with minerals  1 tablet Oral Daily  . sodium chloride flush  5 mL Intracatheter Q8H   Continuous Infusions: . lactated ringers 50 mL/hr at 03/01/21 1205  . piperacillin-tazobactam (ZOSYN)  IV 3.375 g (03/01/21 0626)  . potassium chloride 10 mEq (03/01/21 1208)     LOS: 8 days     Time spent: 27 minutes    Sharen Hones, MD [facility]   To contact the attending provider between 7A-7P or the covering provider during after hours 7P-7A, please log into the web site [email or web address] and access using universal [place] password for that web site. If you do not have the password, please call the hospital operator.  03/01/2021, 1:04 PM

## 2021-03-01 NOTE — Care Management Important Message (Signed)
Important Message  Patient Details  Name: Desiree Madden MRN: [id number] Date of Birth: 04-Mar-1951   Medicare Important Message Given:  Yes     Dannette Barbara 03/01/2021, 2:02 PM

## 2021-03-01 NOTE — Consult Note (Signed)
Pharmacy Antibiotic Note  Desiree Madden is a 70 y.o. female admitted on 02/21/2021 with intra-abdominal infection (diverticulitis w/ fecal perforation). Patient septic with Acute diverticulitis abdominal abscess and enterococcus urinary tract infection secondary to colovesical fistula. Pharmacy has been consulted for vancomycin dosing.  Plan: Vancomycin $RemoveBeforeDE'1250mg'IUfMqbBzBjkTDGf$  x1 loading dose now; with maintenance $RemoveBeforeDE'750mg'tAUhwskxywpJJwH$  q24h to start in ~12h on 3/25.  Pt with AKI, current dosing has calculated Cmin ~16. Will CTM daily renal fxn for changes and adjust accordingly.  Also on Cefepime 2g q12h & Metronidazole $RemoveBeforeD'500mg'yCydFsPSliBsay$  IV q8h  Height: $Remove'5\' 4"'thVIcty$  (162.6 cm) Weight: 67.2 kg (148 lb 2.4 oz) IBW/kg (Calculated) : 54.7  Temp (24hrs), Avg:97.1 F (36.2 C), Min:95.18 F (35.1 C), Max:97.9 F (36.6 C)  Recent Labs  Lab 02/23/21 0816 02/23/21 0824 02/24/21 0552 02/25/21 0803 02/26/21 0823 02/27/21 0425 02/28/21 0613 03/01/21 0622  WBC 27.7*  --  29.6* 24.5* 22.0* 17.5* 21.2* 28.2*  CREATININE 1.19*  --  1.50* 1.57*  --   --  1.49* 1.63*  LATICACIDVEN  --  2.1* 2.2*  --   --   --   --   --     Estimated Creatinine Clearance: 30.3 mL/min (A) (by C-G formula based on SCr of 1.63 mg/dL (H)).    Allergies  Allergen Reactions  . Baclofen     Other reaction(s): Headache  . Sertraline     Other reaction(s): Other (See Comments), Other (See Comments) Muscle aches Muscle aches   . Sulfa Antibiotics     Other reaction(s): Other (See Comments), Other (See Comments) Pt was told not to take it due to colitis Pt was told not to take it due to colitis   . Ibuprofen Nausea And Vomiting  . Trazodone And Nefazodone     Antimicrobials this admission: Vancomycin  3/24>>  Cefepime  3/24 >>  Metronidazole 3/24>>  Dose adjustments this admission: Will CTM for adjustments per renal fxn  Microbiology results: 3/24 BCx: sent/pending 3/16 UCx: E.faecium (NTF-Int; Amp & Vanc - Sens) & E. Faecalis  (Amp,NTF,Vanc-Sens) 3/24 abdominal abscess/deep wound: Moderat GPCs, GPRs, gram variable rods  3/24 MRSA PCR: sent/pending  Thank you for allowing pharmacy to be a part of this patient's care.  Lorna Dibble 03/01/2021 8:32 PM

## 2021-03-01 NOTE — Op Note (Addendum)
Sigmoid colectomy with end colostomy, Hartman's procedure.  Splenic flexure mobilization.  Pre-operative Diagnosis: Complicated diverticulitis with feculent perforation.  Post-operative Diagnosis: same, with gangrene of the sigmoid colon.  Surgeon: Ronny Bacon, M.D., [facility]  Anesthesia: General endotracheal.  Findings: Massive edematous and chronically scarred sigmoid colon extending from mid descending colon with redundancy into the right pelvis.  Gangrenous changes with perforation, and feculent peritonitis with thick peel/biofilm in the left lower quadrant.  Estimated Blood Loss: 350 mL         Specimens: Sigmoid colon          Complications: none              Procedure Details  The patient was seen again in the Holding Room. The benefits, complications, treatment options, and expected outcomes were discussed with the patient. The risks of bleeding, infection, recurrence of symptoms, failure to resolve symptoms, unanticipated injury, prosthetic placement, prosthetic infection, any of which could require further surgery were reviewed with the patient. The likelihood of improving the patient's symptoms with return to their baseline status is guarded.  The patient and/or family concurred with the proposed plan, giving informed consent.  The patient was taken to Operating Room, identified and the procedure verified.    Prior to the induction of general anesthesia, antibiotic prophylaxis was administered. VTE prophylaxis was in place.  General anesthesia was then administered and tolerated well. After the induction, the patient was positioned in the supine position and the Foley was placed, and her abdomen was prepped with Chloraprep and draped in the sterile fashion.  A Time Out was held and the above information confirmed. On placement of the Foley a small lesion was noted at the external urethral office.  The large volume of urine obtained was remarkable for its feculent  appearance.  Midline incision was made and carried through the subcutaneous tissues to the linea alba fascia.  Hemostasis maintained with electrocautery.  The fascia and peritoneum were subsequently opened carefully, and the incision extended to allow adequate exposure.  There was some murky discolored feculent peritoneal fluid present, upon mobilization of the omentum and the sigmoid colon sequestering the pelvis from the peritoneal cavity. Blunt dissection was utilized to mobilize the adherent omentum, small bowel and eventually the sigmoid colon from its lateral abdominal attachments, left lower quadrant and pelvic adhesions.  Once I was able to adequately mobilize the normal colon at the mid descending colon, I created a mesenteric window and proceeded with division with GIA stapler.  I then utilized the LigaSure to divide the sigmoid mesentery, hugging the sigmoid colon throughout its mobilization and division.  Then proceeded to mobilize the rectosigmoid junction, finally arriving at some normal caliber proximal rectum.  The LigaSure was utilized to maintain hemostasis.  A TA 30 stapler was fired across the rectum, and the specimen was excised with a 10 blade scalpel of the stapler.  We had a good intact staple line of the rectal cuff.  I placed 2 separate 2-0 Prolene sutures at the corners of the staple line.   On investigation of the dome of the bladder and identified what appeared to be the remaining 1 cm hole in the bladder from the previously known colovesical fistula.  This was identified after instillation of saline through the Foley catheter and I could identify gross drainage from this opening. Dr. Diamantina Providence was gracious to come and assist, taking over the repair of the bladder.  Please refer to his note for details. Then irrigated the abdominal  cavity with multiple aliquots of warm normal saline solution. Confirmed adequate hemostasis throughout the operative field. I then placed a 19 Blake  drain through the right lower quadrant and secured it.  It was then placed across the pelvis onto the left lower quadrant.   I completed mobilization of the splenic flexure to allow adequate laxity and length to pass through the left upper quadrant for colostomy. I then created an abdominal wall defect at the left upper quadrant for the eventual colostomy.  It was dilated to 2 fingers and I passed the well mobilized proximal colon through this opening.  The colon was clearly pink and viable groin and through the abdominal wall.  There was no tension, or significant tightness around the colon and its mesentery. The midline fascia was then reapproximated with running 0 PDS.  After irrigating the skin and ensuring adequate hemostasis the skin was closed loosely with staples, packed into 3 areas with a wick of 1 inch iodoform gauze. I chose not to mature the stoma as it had become significantly dusky during this interval.  We did place a stomal appliance over the the colon protruding from the abdominal wall to ensure maintenance of its environment. Dressings were then applied to the abdominal wall.       Ronny Bacon M.D., [facility] 03/01/2021 6:40 PM

## 2021-03-01 NOTE — Consult Note (Signed)
NAME:  Desiree Madden, MRN:  [id number], DOB:  07-13-1951, LOS: 8 ADMISSION DATE:  02/21/2021, CONSULTATION DATE:  03/01/2021 REFERRING MD:  Dr. Christian Mate, CHIEF COMPLAINT:  Septic shock   Brief Pt Description:  70 y.o. Female admitted 02/21/2021 due to Diverticulitis with Colovesical fistula and UTI. On 03/01/21 she developed complicated Diverticulitis with perforation requiring Sigmoid colectomy with end colostomy and Hartman's procedure.  Post procedure she returns to ICU and remains intubated with Septic Shock requiring multiple vasopressors.  History of Present Illness:  Desiree Madden is a 70 y.o. Female with a past medical history as listed below who presented to [facility] ED on 02/21/2021 with severe lower abdominal pain and dysuria. She denied fever, chills, or vomiting.  Of note she had been on and off antibiotics over the past several months for recurrent UTI's related to her Colovesical fistula. She was supposed to have surgery for the fistula in the fall of 2021, but it never occurred.  ED Course: CT Abdomen & Pelvis showed evidence of possible diverticulitis and constipation, could not rule out colonic neoplasm.  She was admitted to the Med-Surg unit by the Hospitalist for further workup and treatment of Diverticulitis and UTI.  GI and General Surgery were consulted.  Hospital Course: Pt elected to try medical alternatives instead of proceeding with surgery.  However on 02/28/21 she had worsening fever and Leukocytosis. Repeat CT Abdomen/Pelvis showed an abscess with likely fistula formation, of which a Percutaneous Drain was placed by IR. Despite drain placement, on 03/01/21 her leukocytosis continued to worsen.  Decision was made to proceed with surgery.  She was found to have complicated Diverticulitis with perforation and gangrene of the sigmoid colon, she required a sigmoid colectomy with end colostomy and Hartman's procedure.  She returns to ICU post procedure with severe septic shock  and remains intubated.  Pertinent  Medical History  Hypertension Hypothyroidism Ulcerative colitis Diverticulitis Colon-Bladder fistula  Significant Hospital Events: Including procedures, antibiotic start and stop dates in addition to other pertinent events   . 02/21/2021: Admitted to Med/Surg unit with Diverticulitis and UTI . 02/28/2021: LLQ abdominal drain placed by IR to pelvic abscess . 03/01/2021: Worsening Leukocytosis despite drain placement. Found to have complicated Diverticulitis with perforation requiring Sigmoid Colectomy with end colostomy and Hartman's Procedure.  Post procedure she returns to ICU and remains intubated with Septic Shock requiring multiple vasopressors.  Left Femoral CVC and Arterial lines placed emergently.    Cultures:  02/21/21: SARS CoV-2 PCR>>negative 02/21/21: Influenza PCR>>negative 02/21/2021: Urine>>ENTEROCOCCUS FAECALIS,  ENTEROCOCCUS FAECIUM(sensitive to vancomycin and ampicillin) 02/22/2021: HIV screen>> nonreactive 02/28/2021: LLQ Abdominal drain>> 03/01/2021: MRSA PCR>> negative 03/01/2021: Blood culture x2>>  Antimicrobials:  Ciprofloxacin 3/16>>3/18 Unasyn 3/23>>3/23 Flagyl 3/16>>3/18; 3/24>> Cefepime 3/24>> Vancomycin 3/24>>  Interim History / Subjective:  -Pt is s/p Sigmoid colectomy with end colostomy and Harman's procedure due to complicated diverticulitis with perforation -Returns to ICU, remains intubated, severe septic shock -Giving Bicarb, starting vasopressors, stress dose steroids -Critically ill  Objective   Blood pressure (!) 73/36, pulse (!) 133, temperature (!) 95.36 F (35.2 C), resp. rate 12, height 5' 4"  (1.626 m), weight 67.2 kg, SpO2 97 %.        Intake/Output Summary (Last 24 hours) at 03/01/2021 2021 Last data filed at 03/01/2021 1818 Gross per 24 hour  Intake 3193.5 ml  Output 1155 ml  Net 2038.5 ml   Filed Weights   02/27/21 0900 02/28/21 1014 03/01/21 1511  Weight: 66.9 kg 67.2 kg 67.2 kg  Examination: General: Critically ill appearing female, laying in bed, intubated and sedated, in NAD HENT: Atraumatic, normocephalic, neck supple, no JVD Lungs: Mechanical breath sounds, synchronous with the vent, even Cardiovascular: Tachycardia, regular rhythm, s1s2, no M/R/G Abdomen: Soft, distended, tender, Midline abdominal dressing clean dry and intact, end ostomy  Extremities: No deformities, no clubbing, no edema Neuro: Sedated, withdraws from pain, pupils PERRL GU: Foley catheter in place draining yellow urine  Labs/imaging that I havepersonally reviewed   Bicarb 17, Glucose 146, BUN 29, Cr. 1.44, Albumin 1.7, High sensitivity troponin 63, Lactic acid 7.0, WBC 27.1 w/ Neutrophilia, RBC 2, Hgb 6.1, PT 19.6, INR 1.7, APTT 47 03/01/21: CXR>>The heart size and mediastinal contours are within normal limits. ETT is 1.1 cm above the carina. Overlying median sternotomy wires and surgical are present. There is prominence of the central pulmonary vasculature. No acute osseous abnormality. 03/01/21: KUB>>OG tube tip is in the stomach. Nonobstructive bowel gas pattern. No free air.  Resolved Hospital Problem list     Assessment & Plan:   Septic shock +/- Hemorrhagic shock PMHx of Hypertension -Continuous cardiac monitoring -Maintain MAP >65 -IV fluid resuscitation -Transfusions as indicated -Vasopressors as needed to maintain MAP goal (Currently on Levophed and Vasopressin) -IV albumin -3 amps Bicarb for severe metabolic acidosis -Stress dose steroids -Trend lactic acid -Check troponin -Echocardiogram pending   Severe Sepsis in the setting of Complicated Diverticulitis/Pelvic Abscess with Perforation & Gangrene of the Sigmoid Colon, Colovesical Fistula -Monitor fever curve -Trend WBC's & Procalcitonin -Follow cultures as above -Continue Cefepime, Flagyl, & Vancomycin -General Surgery following, appreciate input -S/p Sigmoid Colectomy with end colostomy, Hartman's  Procedure on 03/01/21, along with Cystorrhaphy   Post-Op Respiratory Failure due to Septic Shock -Full vent support -Wean FiO2 & PEEP as tolerated to maintain O2 sats >92% -Follow intermittent CXR and ABG as needed -Implement VAP bundle -Spontaneous breathing trials when respiratory parameters met and mental status permits -Prn Bronchodilators   CKD Stage III b Metabolic Acidosis in setting of Lactic acidosis -Monitor I&O's / urinary output -Follow BMP -Ensure adequate renal perfusion -Avoid nephrotoxic agents as able -Replace electrolytes as indicated -Bicarb gtt -Trend lactic acid   Normocytic Normochromic Anemia (Acute Blood Loss) EBL in surgery 350 ml -Monitor for S/Sx of bleeding -Trend CBC -SCD's for VTE Prophylaxis (can resume chemical prophylaxis when approved by Surgery) -Transfuse for Hgb <8 -Hgb 6.1 post procedure, will give 2 units pRBC's   Hyperglycemia -CBG's -SSI -Follow ICU Hypo/Hyperglycemia protocol     Best practice (evaluated daily)  Diet:  NPO Pain/Anxiety/Delirium protocol (if indicated): Yes (RASS goal -1) VAP protocol (if indicated): Yes DVT prophylaxis: LMWH GI prophylaxis: PPI Glucose control:  SSI Yes Central venous access:  Yes, and it is still needed Arterial line:  Yes, and it is still needed Foley:  Yes, and it is still needed Mobility:  bed rest  PT consulted: N/A Last date of multidisciplinary goals of care discussion [N/A] Code Status:  full code Disposition: ICU  Labs   CBC: Recent Labs  Lab 02/25/21 0803 02/26/21 0823 02/27/21 0425 02/28/21 0613 03/01/21 0622  WBC 24.5* 22.0* 17.5* 21.2* 28.2*  HGB 10.8* 10.9* 9.6* 9.9* 11.2*  HCT 32.3* 32.0* 27.8* 28.5* 32.2*  MCV 89.5 88.4 86.6 86.6 85.4  PLT 276 289 279 231 322    Basic Metabolic Panel: Recent Labs  Lab 02/23/21 0816 02/24/21 0552 02/25/21 0803 02/28/21 0613 03/01/21 0622 03/01/21 1411  NA 138 136 133* 140 137  --   K 3.7  4.0 3.5 3.2* 2.7* 3.4*   CL 114* 114* 110 115* 110  --   CO2 18* 17* 16* 17* 15*  --   GLUCOSE 148* 120* 123* 88 124*  --   BUN 24* 31* 35* 29* 30*  --   CREATININE 1.19* 1.50* 1.57* 1.49* 1.63*  --   CALCIUM 8.2* 8.3* 7.9* 7.7* 7.5*  --   MG  --   --   --   --  1.9  --   PHOS  --   --   --   --  5.2*  --    GFR: Estimated Creatinine Clearance: 30.3 mL/min (A) (by C-G formula based on SCr of 1.63 mg/dL (H)). Recent Labs  Lab 02/23/21 0824 02/24/21 0552 02/25/21 0803 02/26/21 0823 02/27/21 0425 02/28/21 0613 03/01/21 0622  WBC  --  29.6*   < > 22.0* 17.5* 21.2* 28.2*  LATICACIDVEN 2.1* 2.2*  --   --   --   --   --    < > = values in this interval not displayed.    Liver Function Tests: No results for input(s): AST, ALT, ALKPHOS, BILITOT, PROT, ALBUMIN in the last 168 hours. No results for input(s): LIPASE, AMYLASE in the last 168 hours. No results for input(s): AMMONIA in the last 168 hours.  ABG No results found for: PHART, PCO2ART, PO2ART, HCO3, TCO2, ACIDBASEDEF, O2SAT   Coagulation Profile: No results for input(s): INR, PROTIME in the last 168 hours.  Cardiac Enzymes: No results for input(s): CKTOTAL, CKMB, CKMBINDEX, TROPONINI in the last 168 hours.  HbA1C: No results found for: HGBA1C  CBG: Recent Labs  Lab 03/01/21 0737  GLUCAP 112*    Review of Systems:   Unable to assess due to critical illness, intubation/sedation  Past Medical History:  She,  has a past medical history of Hypertension and Thyroid disease.   Surgical History:   Past Surgical History:  Procedure Laterality Date  . CARDIAC SURGERY       Social History:   reports that she has quit smoking. She has never used smokeless tobacco. She reports that she does not drink alcohol and does not use drugs.   Family History:  Her family history includes Arthritis in an other family member; CAD in her mother.   Allergies Allergies  Allergen Reactions  . Baclofen     Other reaction(s): Headache  . Sertraline      Other reaction(s): Other (See Comments), Other (See Comments) Muscle aches Muscle aches   . Sulfa Antibiotics     Other reaction(s): Other (See Comments), Other (See Comments) Pt was told not to take it due to colitis Pt was told not to take it due to colitis   . Ibuprofen Nausea And Vomiting  . Trazodone And Nefazodone      Home Medications  Prior to Admission medications   Medication Sig Start Date End Date Taking? Authorizing Provider  alendronate (FOSAMAX) 70 MG tablet Take 70 mg by mouth once a week. Take with a full glass of water on an empty stomach.   Yes [provider]  aspirin EC 81 MG tablet Take 81 mg by mouth daily.   Yes [provider]  atorvastatin (LIPITOR) 20 MG tablet Take 20 mg by mouth daily.   Yes [provider]  diclofenac Sodium (VOLTAREN) 1 % GEL Apply topically 4 (four) times daily.   Yes [provider]  fluticasone (FLONASE) 50 MCG/ACT nasal spray Place 2 sprays into both nostrils daily.  Yes [provider]  gabapentin (NEURONTIN) 300 MG capsule Take 300 mg by mouth 3 (three) times daily.   Yes [provider]  levothyroxine (SYNTHROID) 75 MCG tablet Take 75 mcg by mouth daily before breakfast.   Yes [provider]  meloxicam (MOBIC) 15 MG tablet Take 15 mg by mouth daily.   Yes [provider]  metoprolol succinate (TOPROL-XL) 50 MG 24 hr tablet Take 50 mg by mouth daily. 11/23/20  Yes [provider]  oxyCODONE (OXY IR/ROXICODONE) 5 MG immediate release tablet Take 5 mg by mouth every 12 (twelve) hours as needed.   Yes [provider]  traZODone (DESYREL) 100 MG tablet Take 50-100 mg by mouth at bedtime. Take 1/2 tablet by mouth each evening for sleep, can increase to 132m whole tablet after 1st week.   Yes [provider]     Critical care time: 60 minutes    JDarel Hong AThe Colonoscopy Center IncLPort AlleganyPulmonary & Critical Care Medicine Pager:  [phone]

## 2021-03-01 NOTE — Progress Notes (Signed)
Patient arrived from OR, report received from anesthesia and surgical team at bedside. Pt hypotensive on admission, anesthesia providing vasopressors as needed while levo gtt started and fluids wide open. Multiple amps of bicarb given per Dr.Brescia. OGT placed, CXR, abd xray for tube confirmations. Report then to oncoming RN, Rienier. Defib pads and backboard also placed.

## 2021-03-01 NOTE — Anesthesia Procedure Notes (Signed)
Procedure Name: Intubation Date/Time: 03/01/2021 3:47 PM Performed by: Debe Coder, CRNA Pre-anesthesia Checklist: Patient identified, Emergency Drugs available, Suction available and Patient being monitored Patient Re-evaluated:Patient Re-evaluated prior to induction Oxygen Delivery Method: Circle system utilized Preoxygenation: Pre-oxygenation with 100% oxygen Induction Type: IV induction and Rapid sequence Laryngoscope Size: Mac and 3 Grade View: Grade I Tube type: Oral Tube size: 7.0 mm Number of attempts: 1 Airway Equipment and Method: Stylet,  Oral airway and Video-laryngoscopy Placement Confirmation: ETT inserted through vocal cords under direct vision,  positive ETCO2 and breath sounds checked- equal and bilateral Secured at: 20 cm Tube secured with: Tape Dental Injury: Teeth and Oropharynx as per pre-operative assessment

## 2021-03-01 NOTE — Procedures (Signed)
Central Venous Catheter Insertion Procedure Note  Desiree Madden  [id number]  09/17/1951  Date:03/01/21  Time:8:16 PM   Provider Performing:Jeremiah D Dewaine Conger   Procedure: Insertion of Non-tunneled Central Venous Catheter(36556) with US guidance (05056)   Indication(s) Medication administration and Difficult access  Consent Unable to obtain consent due to emergent nature of procedure.  Anesthesia Topical only with 1% lidocaine   Timeout Verified patient identification, verified procedure, site/side was marked, verified correct patient position, special equipment/implants available, medications/allergies/relevant history reviewed, required imaging and test results available.  Sterile Technique Maximal sterile technique including full sterile barrier drape, hand hygiene, sterile gown, sterile gloves, mask, hair covering, sterile ultrasound probe cover (if used).  Procedure Description Area of catheter insertion was cleaned with chlorhexidine and draped in sterile fashion.  With real-time ultrasound guidance a central venous catheter was placed into the left femoral vein. Nonpulsatile blood flow and easy flushing noted in all ports.  The catheter was sutured in place and sterile dressing applied.  Complications/Tolerance None; patient tolerated the procedure well. Chest X-ray is ordered to verify placement for internal jugular or subclavian cannulation.   Chest x-ray is not ordered for femoral cannulation.  EBL Minimal  Specimen(s) None   Line inserted to the 20 cm mark.  BIOPATCH applied to the insertion site.    [name], AGACNP-BC [place] [facility] Pager: [phone]

## 2021-03-01 NOTE — Progress Notes (Signed)
Per Dr. Amie Critchley , lab to collect K after third bag of IV K complete. Doroteo Bradford, RN made aware and order placed.

## 2021-03-02 ENCOUNTER — Inpatient Hospital Stay (HOSPITAL_COMMUNITY)
Admit: 2021-03-02 | Discharge: 2021-03-02 | Disposition: A | Discharge status: Home / Self Care | Payer: Medicare HMO | Attending: Pulmonary Disease | Admitting: Pulmonary Disease

## 2021-03-02 ENCOUNTER — Encounter: Payer: Self-pay | Admitting: Surgery

## 2021-03-02 ENCOUNTER — Inpatient Hospital Stay: Payer: Self-pay

## 2021-03-02 ENCOUNTER — Inpatient Hospital Stay: Payer: Medicare HMO

## 2021-03-02 DIAGNOSIS — K659 Peritonitis, unspecified: Secondary | ICD-10-CM

## 2021-03-02 DIAGNOSIS — I5021 Acute systolic (congestive) heart failure: Secondary | ICD-10-CM | POA: Diagnosis not present

## 2021-03-02 DIAGNOSIS — N1831 Chronic kidney disease, stage 3a: Secondary | ICD-10-CM | POA: Diagnosis not present

## 2021-03-02 DIAGNOSIS — R6521 Severe sepsis with septic shock: Secondary | ICD-10-CM

## 2021-03-02 DIAGNOSIS — R9431 Abnormal electrocardiogram [ECG] [EKG]: Secondary | ICD-10-CM | POA: Diagnosis not present

## 2021-03-02 DIAGNOSIS — A419 Sepsis, unspecified organism: Secondary | ICD-10-CM

## 2021-03-02 DIAGNOSIS — R1084 Generalized abdominal pain: Secondary | ICD-10-CM | POA: Diagnosis not present

## 2021-03-02 DIAGNOSIS — J9601 Acute respiratory failure with hypoxia: Secondary | ICD-10-CM

## 2021-03-02 DIAGNOSIS — R7989 Other specified abnormal findings of blood chemistry: Secondary | ICD-10-CM | POA: Diagnosis not present

## 2021-03-02 DIAGNOSIS — K5792 Diverticulitis of intestine, part unspecified, without perforation or abscess without bleeding: Secondary | ICD-10-CM | POA: Diagnosis not present

## 2021-03-02 LAB — BLOOD GAS, ARTERIAL
Acid-base deficit: 12.2 mmol/L — ABNORMAL HIGH (ref 0.0–2.0)
Acid-base deficit: 4.3 mmol/L — ABNORMAL HIGH (ref 0.0–2.0)
Bicarbonate: 14.8 mmol/L — ABNORMAL LOW (ref 20.0–28.0)
Bicarbonate: 21.6 mmol/L (ref 20.0–28.0)
FIO2: 0.5
FIO2: 0.6
MECHVT: 450 mL
MECHVT: 450 mL
Mechanical Rate: 12
O2 Saturation: 94.5 %
O2 Saturation: 95.8 %
PEEP: 5 cmH2O
PEEP: 5 cmH2O
Patient temperature: 36.5
Patient temperature: 37
RATE: 12 resp/min
RATE: 12 resp/min
pCO2 arterial: 36 mmHg (ref 32.0–48.0)
pCO2 arterial: 42 mmHg (ref 32.0–48.0)
pH, Arterial: 7.22 — ABNORMAL LOW (ref 7.350–7.450)
pH, Arterial: 7.32 — ABNORMAL LOW (ref 7.350–7.450)
pO2, Arterial: 85 mmHg (ref 83.0–108.0)
pO2, Arterial: 87 mmHg (ref 83.0–108.0)

## 2021-03-02 LAB — CBC WITH DIFFERENTIAL/PLATELET
Abs Immature Granulocytes: 0.75 10*3/uL — ABNORMAL HIGH (ref 0.00–0.07)
Basophils Absolute: 0.1 10*3/uL (ref 0.0–0.1)
Basophils Relative: 0 %
Eosinophils Absolute: 0 10*3/uL (ref 0.0–0.5)
Eosinophils Relative: 0 %
HCT: 29.4 % — ABNORMAL LOW (ref 36.0–46.0)
Hemoglobin: 9.8 g/dL — ABNORMAL LOW (ref 12.0–15.0)
Immature Granulocytes: 2 %
Lymphocytes Relative: 1 %
Lymphs Abs: 0.4 10*3/uL — ABNORMAL LOW (ref 0.7–4.0)
MCH: 29.7 pg (ref 26.0–34.0)
MCHC: 33.3 g/dL (ref 30.0–36.0)
MCV: 89.1 fL (ref 80.0–100.0)
Monocytes Absolute: 0.6 10*3/uL (ref 0.1–1.0)
Monocytes Relative: 2 %
Neutro Abs: 30.7 10*3/uL — ABNORMAL HIGH (ref 1.7–7.7)
Neutrophils Relative %: 95 %
Platelets: 187 10*3/uL (ref 150–400)
RBC: 3.3 MIL/uL — ABNORMAL LOW (ref 3.87–5.11)
RDW: 14.9 % (ref 11.5–15.5)
WBC: 32.5 10*3/uL — ABNORMAL HIGH (ref 4.0–10.5)
nRBC: 0.1 % (ref 0.0–0.2)

## 2021-03-02 LAB — TYPE AND SCREEN
ABO/RH(D): O POS
Antibody Screen: NEGATIVE
Unit division: 0
Unit division: 0

## 2021-03-02 LAB — BPAM RBC
Blood Product Expiration Date: 202204262359
Blood Product Expiration Date: 202204262359
ISSUE DATE / TIME: 202203242216
ISSUE DATE / TIME: 202203242327
Unit Type and Rh: 5100
Unit Type and Rh: 5100

## 2021-03-02 LAB — ECHOCARDIOGRAM COMPLETE
AR max vel: 1.77 cm2
AV Area VTI: 2.02 cm2
AV Area mean vel: 1.68 cm2
AV Mean grad: 3 mmHg
AV Peak grad: 5.6 mmHg
Ao pk vel: 1.18 m/s
Area-P 1/2: 4.12 cm2
Height: 64 in
S' Lateral: 3.43 cm
Weight: 2370.39 oz

## 2021-03-02 LAB — TROPONIN I (HIGH SENSITIVITY)
Troponin I (High Sensitivity): 2131 ng/L (ref <18)
Troponin I (High Sensitivity): 7747 ng/L (ref <18)
Troponin I (High Sensitivity): 13757 ng/L (ref <18)
Troponin I (High Sensitivity): 20538 ng/L (ref <18)

## 2021-03-02 LAB — PHOSPHORUS: Phosphorus: 8.1 mg/dL — ABNORMAL HIGH (ref 2.5–4.6)

## 2021-03-02 LAB — BASIC METABOLIC PANEL
Anion gap: 15 (ref 5–15)
BUN: 36 mg/dL — ABNORMAL HIGH (ref 8–23)
CO2: 23 mmol/L (ref 22–32)
Calcium: 6.9 mg/dL — ABNORMAL LOW (ref 8.9–10.3)
Chloride: 101 mmol/L (ref 98–111)
Creatinine, Ser: 1.43 mg/dL — ABNORMAL HIGH (ref 0.44–1.00)
GFR, Estimated: 39 mL/min — ABNORMAL LOW (ref >60)
Glucose, Bld: 175 mg/dL — ABNORMAL HIGH (ref 70–99)
Potassium: 2.8 mmol/L — ABNORMAL LOW (ref 3.5–5.1)
Sodium: 139 mmol/L (ref 135–145)

## 2021-03-02 LAB — MAGNESIUM: Magnesium: 1.8 mg/dL (ref 1.7–2.4)

## 2021-03-02 LAB — COMPREHENSIVE METABOLIC PANEL
ALT: 16 U/L (ref 0–44)
AST: 62 U/L — ABNORMAL HIGH (ref 15–41)
Albumin: 2.5 g/dL — ABNORMAL LOW (ref 3.5–5.0)
Alkaline Phosphatase: 58 U/L (ref 38–126)
Anion gap: 14 (ref 5–15)
BUN: 33 mg/dL — ABNORMAL HIGH (ref 8–23)
CO2: 19 mmol/L — ABNORMAL LOW (ref 22–32)
Calcium: 6.8 mg/dL — ABNORMAL LOW (ref 8.9–10.3)
Chloride: 105 mmol/L (ref 98–111)
Creatinine, Ser: 1.58 mg/dL — ABNORMAL HIGH (ref 0.44–1.00)
GFR, Estimated: 35 mL/min — ABNORMAL LOW (ref >60)
Glucose, Bld: 189 mg/dL — ABNORMAL HIGH (ref 70–99)
Potassium: 3.3 mmol/L — ABNORMAL LOW (ref 3.5–5.1)
Sodium: 138 mmol/L (ref 135–145)
Total Bilirubin: 1.3 mg/dL — ABNORMAL HIGH (ref 0.3–1.2)
Total Protein: 4.6 g/dL — ABNORMAL LOW (ref 6.5–8.1)

## 2021-03-02 LAB — CBC
HCT: 28.3 % — ABNORMAL LOW (ref 36.0–46.0)
Hemoglobin: 9.7 g/dL — ABNORMAL LOW (ref 12.0–15.0)
MCH: 29.6 pg (ref 26.0–34.0)
MCHC: 34.3 g/dL (ref 30.0–36.0)
MCV: 86.3 fL (ref 80.0–100.0)
Platelets: 184 10*3/uL (ref 150–400)
RBC: 3.28 MIL/uL — ABNORMAL LOW (ref 3.87–5.11)
RDW: 15 % (ref 11.5–15.5)
WBC: 40.6 10*3/uL — ABNORMAL HIGH (ref 4.0–10.5)
nRBC: 0.2 % (ref 0.0–0.2)

## 2021-03-02 LAB — LACTIC ACID, PLASMA
Lactic Acid, Venous: 4 mmol/L (ref 0.5–1.9)
Lactic Acid, Venous: 2.6 mmol/L (ref 0.5–1.9)
Lactic Acid, Venous: 1.9 mmol/L (ref 0.5–1.9)
Lactic Acid, Venous: 1.6 mmol/L (ref 0.5–1.9)

## 2021-03-02 LAB — GLUCOSE, CAPILLARY
Glucose-Capillary: 118 mg/dL — ABNORMAL HIGH (ref 70–99)
Glucose-Capillary: 146 mg/dL — ABNORMAL HIGH (ref 70–99)
Glucose-Capillary: 150 mg/dL — ABNORMAL HIGH (ref 70–99)
Glucose-Capillary: 153 mg/dL — ABNORMAL HIGH (ref 70–99)
Glucose-Capillary: 158 mg/dL — ABNORMAL HIGH (ref 70–99)
Glucose-Capillary: 162 mg/dL — ABNORMAL HIGH (ref 70–99)

## 2021-03-02 LAB — PROTIME-INR
INR: 1.5 — ABNORMAL HIGH (ref 0.8–1.2)
Prothrombin Time: 17.2 seconds — ABNORMAL HIGH (ref 11.4–15.2)

## 2021-03-02 LAB — TRIGLYCERIDES: Triglycerides: 148 mg/dL (ref <150)

## 2021-03-02 LAB — PROCALCITONIN
Procalcitonin: 7.56 ng/mL
Procalcitonin: 20.51 ng/mL

## 2021-03-02 LAB — APTT: aPTT: 37 seconds — ABNORMAL HIGH (ref 24–36)

## 2021-03-02 MED ORDER — VANCOMYCIN HCL 1500 MG/300ML IV SOLN
1500.0000 mg | INTRAVENOUS | Status: DC
  Administered 2021-03-03: 1500 mg via INTRAVENOUS
  Filled 2021-03-02: qty 300

## 2021-03-02 MED ORDER — LEVOTHYROXINE SODIUM 50 MCG PO TABS
75.0000 ug | ORAL_TABLET | Freq: Every day | ORAL | Status: DC
  Administered 2021-03-03 – 2021-03-23 (×19): 75 ug
  Filled 2021-03-02: qty 2
  Filled 2021-03-02: qty 1
  Filled 2021-03-02: qty 2
  Filled 2021-03-02 (×2): qty 1
  Filled 2021-03-02 (×7): qty 2
  Filled 2021-03-02: qty 1
  Filled 2021-03-02 (×6): qty 2

## 2021-03-02 MED ORDER — MAGNESIUM SULFATE 2 GM/50ML IV SOLN
2.0000 g | Freq: Once | INTRAVENOUS | Status: AC
  Administered 2021-03-02: 2 g via INTRAVENOUS
  Filled 2021-03-02: qty 50

## 2021-03-02 MED ORDER — SODIUM CHLORIDE 0.9% FLUSH: 10.0000 mL | INTRAVENOUS | Status: DC | PRN

## 2021-03-02 MED ORDER — POTASSIUM CHLORIDE 10 MEQ/100ML IV SOLN
10.0000 meq | INTRAVENOUS | Status: AC
Start: 2021-03-02 — End: 2021-03-02
  Administered 2021-03-02 (×2): 10 meq via INTRAVENOUS
  Filled 2021-03-02 (×2): qty 100

## 2021-03-02 MED ORDER — ONDANSETRON HCL 4 MG PO TABS: 4.0000 mg | ORAL_TABLET | Freq: Four times a day (QID) | ORAL | Status: DC | PRN

## 2021-03-02 MED ORDER — ACETAMINOPHEN 325 MG PO TABS
650.0000 mg | ORAL_TABLET | Freq: Four times a day (QID) | ORAL | Status: DC | PRN
  Administered 2021-03-10 – 2021-03-11 (×2): 650 mg
  Filled 2021-03-02 (×2): qty 2

## 2021-03-02 MED ORDER — SODIUM BICARBONATE 8.4 % IV SOLN
50.0000 meq | Freq: Once | INTRAVENOUS | Status: AC
  Administered 2021-03-02: 50 meq via INTRAVENOUS
  Filled 2021-03-02: qty 50

## 2021-03-02 MED ORDER — ADULT MULTIVITAMIN W/MINERALS CH
1.0000 | ORAL_TABLET | Freq: Every day | ORAL | Status: DC
  Administered 2021-03-02 – 2021-03-23 (×21): 1
  Filled 2021-03-02 (×21): qty 1

## 2021-03-02 MED ORDER — FENTANYL 2500MCG IN NS 250ML (10MCG/ML) PREMIX INFUSION
0.0000 ug/h | INTRAVENOUS | Status: DC
  Administered 2021-03-02: 200 ug/h via INTRAVENOUS
  Administered 2021-03-04: 50 ug/h via INTRAVENOUS
  Administered 2021-03-05: 125 ug/h via INTRAVENOUS
  Administered 2021-03-05: 250 ug/h via INTRAVENOUS
  Administered 2021-03-06: 300 ug/h via INTRAVENOUS
  Administered 2021-03-06: 250 ug/h via INTRAVENOUS
  Administered 2021-03-07 (×3): 300 ug/h via INTRAVENOUS
  Administered 2021-03-08 (×2): 250 ug/h via INTRAVENOUS
  Administered 2021-03-08: 200 ug/h via INTRAVENOUS
  Administered 2021-03-09 – 2021-03-10 (×3): 250 ug/h via INTRAVENOUS
  Filled 2021-03-02 (×15): qty 250

## 2021-03-02 MED ORDER — POTASSIUM CHLORIDE 20 MEQ PO PACK
40.0000 meq | PACK | Freq: Once | ORAL | Status: AC
  Administered 2021-03-02: 40 meq
  Filled 2021-03-02: qty 2

## 2021-03-02 MED ORDER — ATORVASTATIN CALCIUM 20 MG PO TABS
20.0000 mg | ORAL_TABLET | Freq: Every day | ORAL | Status: DC
  Administered 2021-03-02 – 2021-03-07 (×6): 20 mg
  Filled 2021-03-02 (×6): qty 1

## 2021-03-02 MED ORDER — POTASSIUM CHLORIDE 10 MEQ/100ML IV SOLN
10.0000 meq | INTRAVENOUS | Status: AC
  Administered 2021-03-02 (×4): 10 meq via INTRAVENOUS
  Filled 2021-03-02 (×4): qty 100

## 2021-03-02 MED ORDER — ONDANSETRON HCL 4 MG/2ML IJ SOLN: 4.0000 mg | Freq: Four times a day (QID) | INTRAMUSCULAR | Status: DC | PRN

## 2021-03-02 MED ORDER — SODIUM CHLORIDE 0.9% FLUSH
10.0000 mL | Freq: Two times a day (BID) | INTRAVENOUS | Status: DC
  Administered 2021-03-02 – 2021-03-04 (×5): 10 mL
  Administered 2021-03-04: 30 mL
  Administered 2021-03-05 – 2021-03-09 (×10): 10 mL
  Administered 2021-03-10: 20 mL
  Administered 2021-03-10 – 2021-03-12 (×3): 10 mL
  Administered 2021-03-12: 30 mL
  Administered 2021-03-13 – 2021-03-26 (×26): 10 mL
  Administered 2021-03-26: 08:00:00 20 mL
  Administered 2021-03-27 (×2): 10 mL
  Administered 2021-03-28: 20:00:00 30 mL
  Administered 2021-03-28: 20 mL
  Administered 2021-03-29: 30 mL
  Administered 2021-03-29 – 2021-03-30 (×2): 10 mL
  Administered 2021-03-30 – 2021-03-31 (×2): 30 mL
  Administered 2021-03-31 – 2021-04-01 (×2): 10 mL
  Administered 2021-04-01 – 2021-04-02 (×2): 30 mL
  Administered 2021-04-02: 10 mL

## 2021-03-02 MED ORDER — ACETAMINOPHEN 650 MG RE SUPP: 650.0000 mg | Freq: Four times a day (QID) | RECTAL | Status: DC | PRN

## 2021-03-02 MED ORDER — CALCIUM GLUCONATE-NACL 1-0.675 GM/50ML-% IV SOLN
1.0000 g | Freq: Once | INTRAVENOUS | Status: AC
  Administered 2021-03-02: 1000 mg via INTRAVENOUS
  Filled 2021-03-02: qty 50

## 2021-03-02 MED ORDER — HYDROCODONE-ACETAMINOPHEN 5-325 MG PO TABS
1.0000 | ORAL_TABLET | ORAL | Status: DC | PRN
  Administered 2021-03-11 – 2021-03-13 (×3): 1
  Filled 2021-03-02 (×3): qty 1

## 2021-03-02 MED ORDER — ENSURE ENLIVE PO LIQD: 237.0000 mL | Freq: Three times a day (TID) | ORAL | Status: DC

## 2021-03-02 NOTE — Consult Note (Signed)
CARDIOLOGY CONSULT NOTE               Patient ID: Trent Theisen MRN: [id number] DOB/AGE: 71-Dec-1952 71 y.o.  Admit date: 02/21/2021 Referring Physician: [name], NP  Primary Physician: Gladstone Lighter, MD Primary Cardiologist: Flossie Dibble, MD  Reason for Consultation: elevated troponin  HPI: Mrs. Pierron is a 69 year old Caucasian female with PMH significant for CAD s/p CABG (2015), hypertension, hypothyroidism, colon bladder fistula, diverticulitis and ulcerative colitis who was admitted on 02/21/21 due to diverticulitis with colovesical fistula and UTI which was further complicated by the development of complicated diverticulitis with perforation and gangrenous changes requiring Hartman's procedure performed on 03/01/21. Post surgery the patient developed severe septic shock requiring vasopressor therapy and she was unable to be extubated.  The ECG at that time revealed ST depression and her troponin level at that time was elevated at 63>>233. Today the troponin levels have continued to elevate at 2,131>>7747>>13757 (peak); therefore, Cardiology was consulted for further recommendations.   Subjective data is unable to be obtained at this time due to intubation and sedation.  The patient continues to be on vasopressor support with a stable blood pressure, her bedside telemetry monitor reveals normal sinus rhythm, and all other vital signs are normal. The patient appears slightly hypervolemic with edema in the extremities.    Review of systems complete and found to be negative unless listed above     Past Medical History:  Diagnosis Date  . Hypertension   . Thyroid disease     Past Surgical History:  Procedure Laterality Date  . CARDIAC SURGERY    . COLECTOMY WITH COLOSTOMY CREATION/HARTMANN PROCEDURE Left 03/01/2021   Procedure: COLECTOMY WITH COLOSTOMY CREATION/HARTMANN PROCEDURE;  Surgeon: Ronny Bacon, MD;  Location: [facility] ORS;  Service: General;   Laterality: Left;    Medications Prior to Admission  Medication Sig Dispense Refill Last Dose  . alendronate (FOSAMAX) 70 MG tablet Take 70 mg by mouth once a week. Take with a full glass of water on an empty stomach.     Marland Kitchen aspirin EC 81 MG tablet Take 81 mg by mouth daily.   02/20/2021 at Unknown time  . atorvastatin (LIPITOR) 20 MG tablet Take 20 mg by mouth daily.   02/20/2021 at Unknown time  . diclofenac Sodium (VOLTAREN) 1 % GEL Apply topically 4 (four) times daily.     . fluticasone (FLONASE) 50 MCG/ACT nasal spray Place 2 sprays into both nostrils daily.   02/20/2021 at Unknown time  . gabapentin (NEURONTIN) 300 MG capsule Take 300 mg by mouth 3 (three) times daily.     Marland Kitchen levothyroxine (SYNTHROID) 75 MCG tablet Take 75 mcg by mouth daily before breakfast.     . meloxicam (MOBIC) 15 MG tablet Take 15 mg by mouth daily.     . metoprolol succinate (TOPROL-XL) 50 MG 24 hr tablet Take 50 mg by mouth daily.   02/20/2021 at Unknown time  . oxyCODONE (OXY IR/ROXICODONE) 5 MG immediate release tablet Take 5 mg by mouth every 12 (twelve) hours as needed.     . traZODone (DESYREL) 100 MG tablet Take 50-100 mg by mouth at bedtime. Take 1/2 tablet by mouth each evening for sleep, can increase to $RemoveBef'100mg'[id number]$  whole tablet after 1st week.      Social History   Socioeconomic History  . Marital status: Married    Spouse name: Not on file  . Number of children: Not on file  . Years of education: Not on file  .  Highest education level: Not on file  Occupational History  . Not on file  Tobacco Use  . Smoking status: Former Research scientist (life sciences)  . Smokeless tobacco: Never Used  Vaping Use  . Vaping Use: Never used  Substance and Sexual Activity  . Alcohol use: Never  . Drug use: Never  . Sexual activity: Not on file  Other Topics Concern  . Not on file  Social History Narrative  . Not on file   Social Determinants of Health   Financial Resource Strain: Not on file  Food Insecurity: Not on file  Transportation  Needs: Not on file  Physical Activity: Not on file  Stress: Not on file  Social Connections: Not on file  Intimate Partner Violence: Not on file    Family History  Problem Relation Age of Onset  . Arthritis Other   . CAD Mother       Review of systems complete and found to be negative unless listed above      PHYSICAL EXAM  General:  well nourished, in no acute distress HEENT:  Normocephalic and atraumatic. Intubation tube remains in place.  Neck:  No JVD.  Lungs: Clear bilaterally to auscultation. Chest expansion symmetrical.  Heart: HRRR . Normal S1 and S2 without gallops or murmurs.  Abdomen: Bowel sounds are positive, abdomen soft and non-tender. New colostomy remains in place. JP drains remain in place.  Msk:  Normal tone for age.  Extremities: mild, nonpitting peripheral edema. No clubbing or cyanosis. Neuro: UTA; sedated Psych: UTA;   Labs:   Lab Results  Component Value Date   WBC 32.5 (H) 03/02/2021   HGB 9.8 (L) 03/02/2021   HCT 29.4 (L) 03/02/2021   MCV 89.1 03/02/2021   PLT 187 03/02/2021    Recent Labs  Lab 03/02/21 0344  NA 138  K 3.3*  CL 105  CO2 19*  BUN 33*  CREATININE 1.58*  CALCIUM 6.8*  PROT 4.6*  BILITOT 1.3*  ALKPHOS 58  ALT 16  AST 62*  GLUCOSE 189*   No results found for: CKTOTAL, CKMB, CKMBINDEX, TROPONINI No results found for: CHOL No results found for: HDL No results found for: [facility] Lab Results  Component Value Date   TRIG 148 03/02/2021   No results found for: CHOLHDL No results found for: LDLDIRECT    Radiology: DG Abd 1 View  Result Date: 03/01/2021 CLINICAL DATA:  OG tube placement EXAM: ABDOMEN - 1 VIEW COMPARISON:  02/23/2021 FINDINGS: OG tube tip is in the stomach. Nonobstructive bowel gas pattern. No free air. IMPRESSION: OG tube tip in the stomach. Electronically Signed   By: Rolm Baptise M.D.   On: 03/01/2021 19:27   CT ABDOMEN PELVIS W CONTRAST  Result Date: 02/28/2021 CLINICAL DATA:  Left lower  quadrant abdominal pain. History diverticulitis. EXAM: CT ABDOMEN AND PELVIS WITH CONTRAST TECHNIQUE: Multidetector CT imaging of the abdomen and pelvis was performed using the standard protocol following bolus administration of intravenous contrast. CONTRAST:  45mL OMNIPAQUE IOHEXOL 300 MG/ML  SOLN COMPARISON:  CT scan 02/24/2021 FINDINGS: Lower chest: Margins persistent small bilateral pleural effusions with overlying atelectasis. The heart is normal in size. No pericardial effusion. Stable aortic and advanced coronary artery calcifications. Surgical changes from coronary artery bypass surgery. The distal esophagus is grossly normal. Hepatobiliary: No biliary no hepatic lesions or intrahepatic biliary dilatation. The gallbladder is unremarkable. Persistent perihepatic fluid. No common bile duct dilatation. Pancreas: No mass, inflammation or ductal dilatation. Spleen: Normal size.  No focal lesions. Adrenals/Urinary  Tract: The adrenal glands are normal. No renal lesions or hydroureteronephrosis. There is a large amount of gas in the bladder most likely from recent catheterization. Stomach/Bowel: The stomach, duodenum and small bowel are unremarkable. No acute inflammatory process or obstructive findings. Contrast gets all the way to the colon. Diffuse scattered colonic diverticulosis. Areas of colonic inflammation noted involving the descending colon and almost the entire sigmoid colon with marked wall thickening and submucosal edema. There is a new pericolonic abscess with a possible intramural component but definite extramural component in the left pelvis measuring a maximum of 8.3 x 4.7 cm. It appears to contain a small amount of dependent contrast material suggesting active extravasation. Vascular/Lymphatic: Stable advanced atherosclerotic calcifications involving the aorta and iliac arteries. No aneurysm. The major venous structures are patent. There are numerous scattered borderline retroperitoneal lymph  nodes which are likely inflammatory/reactive. Reproductive: The uterus and ovaries are grossly normal in stable. Other: Diffuse subcutaneous soft tissue swelling/edema suggesting anasarca. There is also scattered free abdominal and free pelvic fluid. Musculoskeletal: If no significant bony findings. IMPRESSION: 1. Persistent changes of acute severe diverticulitis involving the descending colon and almost the entire sigmoid colon. 2. New pericolonic abscess measuring a maximum of 8.3 x 4.7 cm. It appears to contain a small amount of dependent contrast material suggesting active extravasation. 3. Persistent small bilateral pleural effusions with overlying atelectasis. 4. Persistent perihepatic and free pelvic fluid. 5. Diffuse subcutaneous soft tissue swelling/edema suggesting anasarca. 6. Stable advanced atherosclerotic calcifications involving the aorta and iliac arteries. 7. Large amount of gas in the bladder most likely from recent catheterization. 8. Aortic atherosclerosis. These results will be called to the ordering clinician or representative by the Radiologist Assistant, and communication documented in the PACS or [employer]. Aortic Atherosclerosis (ICD10-I70.0). Electronically Signed   By: Marijo Sanes M.D.   On: 02/28/2021 13:43   CT ABDOMEN PELVIS W CONTRAST  Result Date: 02/24/2021 CLINICAL DATA:  Lower abdominal and pelvic pain EXAM: CT ABDOMEN AND PELVIS WITH CONTRAST TECHNIQUE: Multidetector CT imaging of the abdomen and pelvis was performed using the standard protocol following bolus administration of intravenous contrast. CONTRAST:  24mL OMNIPAQUE IOHEXOL 300 MG/ML  SOLN COMPARISON:  02/21/2021 FINDINGS: Lower chest: New atelectasis in the lower lobes and lingula. Trace bilateral pleural effusions are likewise new. Prior CABG. Coronary and aortic atherosclerosis. Contrast medium in the distal esophagus suggest dysmotility or reflux. Hepatobiliary: Borderline distended gallbladder. No  biliary dilatation. No focal hepatic parenchymal lesion identified. Pancreas: Unremarkable Spleen: Unremarkable Adrenals/Urinary Tract: Both adrenal glands appear within normal limits. Borderline right hydroureter extending to the iliac vessel cross over with the ureters obscured by surrounding inflammatory findings. No hydronephrosis or delay in excretion of contrast medium. Small amount of left lateral perinephric edema. Stomach/Bowel: Normal appendix. Descending and sigmoid colon diverticulosis with notably prominent caliber of the sigmoid colon, considerable paracolic stranding around the distal descending and sigmoid colon, and substantial localized wall thickening along a 9 cm segment of the distal sigmoid colon for example on image 70 of series 2 suspicious for progressive diverticulitis and likely some superimposed regional colitis. Air-fluid level in the rectum. Peripheral intermediate density in the cecum for example on image 51 of series 2 simulates wall thickening, but there is no wall thickening in this region on 02/21/2021 hence this appearance is due to isodense fluid rather than actual cecal wall thickening. Vascular/Lymphatic: Aortoiliac atherosclerotic vascular disease. Scattered likely reactive retroperitoneal lymph nodes including an index left periaortic node measuring 0.8 cm in  short axis on image 48 series 2. Reproductive: Unremarkable Other: New perihepatic and perisplenic ascites along with fluid tracking along the paracolic gutter. New pelvic ascites. No free intraperitoneal gas is identified. I do not observe a discrete drainable abscess. Musculoskeletal: Unchanged compression fracture at T11. Lower lumbar spondylosis and degenerative disc disease. IMPRESSION: 1. Worsened prominent paracolic stranding around the distal descending and sigmoid colon, and substantial localized wall thickening along a 9 cm segment of the distal sigmoid colon suspicious for progressive diverticulitis and likely  some superimposed regional colitis. No free intraperitoneal gas or discrete drainable abscess identified. 2. New perihepatic and perisplenic ascites along with fluid tracking along the paracolic gutters and pelvic ascites. 3. Trace bilateral pleural effusions with new atelectasis in the lower lobes and lingula. 4. Contrast medium in the distal esophagus suggest dysmotility or reflux. 5. Unchanged compression fracture at T11. 6. Borderline distended gallbladder. 7. Borderline right hydroureter extending to the iliac vessel cross over with the ureters obscured by surrounding inflammatory findings. No hydronephrosis or delay in excretion of contrast medium. 8. Aortic atherosclerosis. Aortic Atherosclerosis (ICD10-I70.0). Electronically Signed   By: Van Clines M.D.   On: 02/24/2021 13:58   CT ABDOMEN PELVIS W CONTRAST  Result Date: 02/21/2021 CLINICAL DATA:  Lower abdominal pain. History of diverticulitis. 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 EXAM: CT ABDOMEN AND PELVIS WITH CONTRAST TECHNIQUE: Multidetector CT imaging of the abdomen and pelvis was performed using the standard protocol following bolus administration of intravenous contrast. CONTRAST:  53mL OMNIPAQUE IOHEXOL 300 MG/ML  SOLN COMPARISON:  None. FINDINGS: Lower chest: No basilar consolidation or pleural effusion. Minimal posterior pleural thickening on the left. Coronary artery calcifications. Epicardial leads in place. Hepatobiliary: Minimal focal fatty infiltration adjacent to the gallbladder fossa. No suspicious hepatic lesion. No calcified gallstone or pericholecystic fat stranding. No biliary dilatation. Pancreas: No ductal dilatation or inflammation. Spleen: Normal in size without focal abnormality. Adrenals/Urinary Tract: Normal adrenal glands. Lobulated bilateral renal contours. Slight atrophy of left kidney. No hydronephrosis. No perinephric edema. Tiny cyst in the upper left kidney. No visualized renal  stone. Symmetric excretion on delayed phase imaging. The urinary bladder is displaced into the left pelvis. There is a in bladder or fluid level. No perivesicular fat stranding. No definite bladder wall thickening. Loss of fat plane between the superior aspect of the urinary bladder and a sigmoid colonic diverticulum. Stomach/Bowel: Pericolonic fat stranding about the proximal sigmoid colon, series 2, image 62, in the region of multiple colonic diverticula suspicious for diverticulitis. This fat stranding extends to the adjacent urinary bladder. There are numerable colonic diverticula from the splenic flexure distally. Large volume of colonic stool from the splenic flexure distally. Mild sigmoid colonic wall thickening which is nonspecific. There are scattered right colonic diverticula. Transverse colon is tortuous. Small hiatal hernia. Stomach is decompressed. No small bowel obstruction or inflammation. Normal air-filled appendix. Vascular/Lymphatic: Moderately advanced aortic atherosclerosis. Irregular plaque in the upper abdominal aorta. Incidental 2 right and left renal arteries. No aneurysm. Calcified plaque throughout the left common iliac artery causes some degree of stenosis. Patent portal vein. No portal or mesenteric venous gas. Multiple small retroperitoneal lymph nodes, typically reactive. No bulky abdominopelvic adenopathy. Reproductive: Uterus remains in situ, atrophic and deviated to the left. No adnexal mass. Other: No free air or focal fluid collection. Minimal free fluid in the left pelvis. No abdominal wall hernia. Musculoskeletal: Chronic but progressive moderately severe T11 compression fracture. Increase vertebral body height loss since 11/23/2019 thoracic spine  CT, particularly anteriorly. Hemi transitional lumbosacral anatomy with enlarged right transverse process and pseudoarticulation with the sacrum. Bones are subjectively under mineralized. IMPRESSION: 1. Advanced colonic diverticulosis  with pericolonic fat stranding about the proximal sigmoid colon, in the region of multiple colonic diverticula, suspicious for acute diverticulitis. This fat stranding extends to the adjacent urinary bladder. 2. Fluid level in the urinary bladder. This may be due to Foley catheter/instrumentation, or fistula as provided history. Pericolonic edema extends to the anterior aspect of the bladder. 3. Large volume of colonic stool from the splenic flexure distally, suggesting constipation. There is wall thickening in the mid sigmoid colon that is nonspecific. Recommend up-to-date colonoscopy to exclude the possibility of colonic neoplasm. 4. Chronic but progressive moderately severe T11 compression fracture. Increase vertebral body height loss since 11/23/2019 thoracic spine CT, particularly anteriorly. Aortic Atherosclerosis (ICD10-I70.0). Electronically Signed   By: Keith Rake M.D.   On: 02/21/2021 16:54   Portable Chest xray  Result Date: 03/02/2021 CLINICAL DATA:  Acute respiratory failure with hypoxia EXAM: PORTABLE CHEST 1 VIEW COMPARISON:  Radiograph 03/01/2021 FINDINGS: Endotracheal tube tip terminates 4 cm from the carina. Transesophageal tube tip and side port distal to the GE junction within the gastric body. Postsurgical changes from sternotomy and CABG with stable cardiomediastinal contours including a calcified, tortuous aorta. Telemetry leads and support devices overlie the chest. Central vascular congestion with hazy interstitial opacities which could reflect combination of atelectasis and edema. Layering left effusion. No visible right effusion. No pneumothorax. No other acute osseous or soft tissue abnormality. IMPRESSION: 1. Lines and tubes as above. 2. Prior sternotomy and CABG. 3. Hazy opacities likely reflecting combination edema and atelectasis with layering left effusion. Electronically Signed   By: Lovena Le M.D.   On: 03/02/2021 04:28   DG Chest Port 1 View  Result Date:  03/01/2021 CLINICAL DATA:  ET tube and OG tube placement EXAM: PORTABLE CHEST 1 VIEW COMPARISON:  None. FINDINGS: The heart size and mediastinal contours are within normal limits. ETT is 1.1 cm above the carina. Overlying median sternotomy wires and surgical are present. There is prominence of the central pulmonary vasculature. No acute osseous abnormality. IMPRESSION: ETT is 1.1 cm above the carina. Mild pulmonary vascular congestion. Electronically Signed   By: Prudencio Pair M.D.   On: 03/01/2021 19:41   DG Abd Portable 2V  Result Date: 02/23/2021 CLINICAL DATA:  Abdominal pain, diverticulitis EXAM: PORTABLE ABDOMEN - 2 VIEW COMPARISON:  02/21/2021 CT abdomen/pelvis FINDINGS: Intact visualized lower sternotomy wires. No dilated small bowel loops. Large diffuse colonic stool volume. No evidence pneumatosis or pneumoperitoneum. No radiopaque nephrolithiasis. IMPRESSION: Large diffuse colonic stool volume, suggesting constipation. Nonobstructive bowel gas pattern. No evidence of pneumoperitoneum. Electronically Signed   By: Ilona Sorrel M.D.   On: 02/23/2021 10:38   DG BE (COLON)W SINGLE CM (SOL OR THIN BA)  Result Date: 02/26/2021 CLINICAL DATA:  No bowel movements for than 1 week. EXAM: WATER SOLUBLE CONTRAST ENEMA TECHNIQUE: Initial scout AP supine abdominal image obtained to insure adequate colon cleansing. Dilute Gastrografin was introduced into the colon in a retrograde fashion and refluxed from the rectum to the cecum. FLUOROSCOPY TIME:  Fluoroscopy Time:  0.8 minute Radiation Exposure Index (if provided by the fluoroscopic device): 8.5 mGy Number of Acquired Spot Images: 0 COMPARISON:  None. FINDINGS: KUB: Small amount of oral contrast material within the colon. Large amount of stool in the sigmoid colon. No bowel dilatation to suggest obstruction. No evidence of pneumoperitoneum, portal venous gas  or pneumatosis. No pathologic calcifications along the expected course of the ureters. No acute osseous  abnormality. Single-contrast barium enema: An enema catheter was inserted and retention balloon distended under fluoroscopy. Dilute Gastrografin was then introduced into the colon. Contrast opacifies the rectosigmoid colon. Contrast could not be refluxed past the sigmoid colon. The patient was unable to retain the contrast after multiple attempts. IMPRESSION: Contrast opacifies the rectosigmoid colon. Contrast could not be refluxed past the sigmoid colon. The patient was unable to retain the contrast after multiple attempts. Electronically Signed   By: Kathreen Devoid   On: 02/26/2021 11:57   CT IMAGE GUIDED DRAINAGE BY PERCUTANEOUS CATHETER  Result Date: 02/28/2021 INDICATION: 70 year old female with a history of diverticular abscess. Known colovesical fistula. She presents for drainage of the abscess EXAM: CT GUIDED DRAINAGE OF  ABSCESS MEDICATIONS: The patient is currently admitted to the hospital and receiving intravenous antibiotics. The antibiotics were administered within an appropriate time frame prior to the initiation of the procedure. ANESTHESIA/SEDATION: 1.0 mg IV Versed 50 mcg IV Fentanyl Moderate Sedation Time:  11 minutes The patient was continuously monitored during the procedure by the interventional radiology nurse under my direct supervision. COMPLICATIONS: None TECHNIQUE: Informed written consent was obtained from the patient after a thorough discussion of the procedural risks, benefits and alternatives. All questions were addressed. Maximal Sterile Barrier Technique was utilized including caps, mask, sterile gowns, sterile gloves, sterile drape, hand hygiene and skin antiseptic. A timeout was performed prior to the initiation of the procedure. PROCEDURE: The left lower quadrant was prepped with chlorhexidine in a sterile fashion, and a sterile drape was applied covering the operative field. A sterile gown and sterile gloves were used for the procedure. Local anesthesia was provided with 1%  Lidocaine. Once the patient was prepped and draped and anesthetized with 1% lidocaine, CT guidance was used to place a 18 gauge trocar needle into the abscess of the left lower quadrant. Modified Seldinger technique was then used to place a 12 [place] drain. Approximately 100 cc of thin amber fluid aspirated for a culture. Drain was attached to bulb suction and sutured in position. Patient tolerated the procedure well and remained hemodynamically stable throughout. No complications were encountered and no significant blood loss. FINDINGS: Final image demonstrates pigtail drainage catheter in place within the abscess of the left lower quadrant. The only aspirate achieved was thin amber fluid. There is persisting flocculent material around the drain at the completion. This may represent frank contamination of the peritoneum with fecal material. Approximately 100 cc of fluid aspirated with a sample sent for culture. IMPRESSION: Status post CT-guided drainage of left lower quadrant abscess Signed, Dulcy Fanny. Dellia Nims, RPVI [facility] Radiology Electronically Signed   By: Corrie Mckusick D.O.   On: 02/28/2021 15:13    EKG: ST  ASSESSMENT AND PLAN:  Mrs. Larocque is a 70 year old Caucasian female with PMH significant for CAD s/p CABG (2015), hypertension, hypothyroidism, colon bladder fistula, diverticulitis and ulcerative colitis who was admitted on 02/21/21 due to diverticulitis with colovesical fistula and UTI which was further complicated by the development of complicated diverticulitis with perforation and gangrenous changes requiring Hartman's procedure performed on 03/01/21. Post surgery the patient developed severe septic shock requiring vasopressor therapy and she was unable to be extubated.  The ECG at that time revealed ST depression and her troponin level at that time was elevated at 63>>233. Today the troponin levels have continued to elevate at  2,131>>7747>>13757 (peak); therefore,  Cardiology was consulted for further recommendations. The patient continues to require pressure support, as well as mechanical ventilation. Due to her CAD history and currently elevated troponin heparin gtt is recommended at this time; however, surgery would need to clear this patient prior to safely starting anticoagulant therapy. Echocardiogram is recommended at this time. Cardiac Catheterization is deferred.    1. Elevated troponin 63>>233>>2,131>>7747>>13757 (peak) with known CAD, differential diagnosis includes demand ischemia versus NSTEMI  -Recommend heparin drip ONLY when cleared to safely start by surgery to avoid hemorrhaging.  -Recommend echocardiogram at this time.  -Cardiac catheterization deferred.  -Continuous cardiac telemetry monitoring.  -Trend BMP.  -Continue atorvastatin.  -Restart beta-blocker and aspirin when patient is hemodynamically stable and not at increased risk for bleeding.  2.  Septic shock  -Agree with current therapy.  3.  Postoperative respiratory failure due to septic shock  -Agree with current management.   4.  CKD stage IIIb  -Strictly monitor I's and O's, trend BMP, replace electrolytes  -Consider nephrology referral if symptoms worsen.   Signed: Delicia White ACNPC-AG 03/02/2021, 10:52 AM

## 2021-03-02 NOTE — Progress Notes (Signed)
ABG results 7.32/42/87/21.6

## 2021-03-02 NOTE — Progress Notes (Signed)
*  PRELIMINARY RESULTS* Echocardiogram 2D Echocardiogram has been performed.  Desiree Madden 03/02/2021, 11:37 AM

## 2021-03-02 NOTE — Plan of Care (Signed)
  Problem: Clinical Measurements: Goal: Diagnostic test results will improve Outcome: Progressing Goal: Respiratory complications will improve Outcome: Progressing Goal: Cardiovascular complication will be avoided Outcome: Progressing   Problem: Activity: Goal: Risk for activity intolerance will decrease Outcome: Progressing   Problem: Pain Managment: Goal: General experience of comfort will improve Outcome: Progressing   Problem: Safety: Goal: Ability to remain free from injury will improve Outcome: Progressing   Problem: Skin Integrity: Goal: Risk for impaired skin integrity will decrease Outcome: Progressing   Problem: Clinical Measurements: Goal: Ability to maintain clinical measurements within normal limits will improve Outcome: Not Progressing Goal: Will remain free from infection Outcome: Not Progressing   Problem: Nutrition: Goal: Adequate nutrition will be maintained Outcome: Not Progressing

## 2021-03-02 NOTE — Progress Notes (Signed)
PHARMACY CONSULT NOTE - FOLLOW UP  Pharmacy Consult for Electrolyte Monitoring and Replacement   Recent Labs: Potassium (mmol/L)  Date Value  03/02/2021 2.8 (L)   Magnesium (mg/dL)  Date Value  03/02/2021 1.8   Calcium (mg/dL)  Date Value  03/02/2021 6.9 (L)   Albumin (g/dL)  Date Value  03/02/2021 2.5 (L)   Phosphorus (mg/dL)  Date Value  03/02/2021 8.1 (H)   Sodium (mmol/L)  Date Value  03/02/2021 139   Corrected Ca: 8.1  Assessment: Desiree Madden is a 70 y.o. female admitted on 02/21/2021 with intra-abdominal infection (diverticulitis w/ fecal perforation). Patient septic with acute diverticulitis abdominal abscess with colovesical fistula and enterococcus urinary tract infection which was further complicated by the development of complicated diverticulitis with perforation and gangrenous changes requiring Hartman's procedure performed on 03/01/21. Pt developed severe septic shock after procedure requiring vasopressors. Additionally, pt has metabolic acidosis. PMH includes CAD s/p CABG (2015), HTN, hypothyroidism, colon bladder fistula, diverticulitis, and ulcerative colitis. Pharmacy has been consulted for electrolyte monitoring.  Fluids: LR @ 50 mL/hr and sodium bicarb 150 mEq @ 150 mL/hr  Goal of Therapy:  Electrolytes WNL  Plan:  -- K 3.3 on 3/25 @ 0344 - replaced with KCl 10 mEq x 2 doses this AM. Repeat K level @ 1812: 2.8 - ordered KCl 10 mEq IV x 4 doses and KCl 40 mEq PO x 1 dose -- Mg 1.8 - replaced with Mg 2g x 1 dose this AM -- Ca 8.1- replaced with Ca Gluc 1 g x 1 dose this AM -- Monitor electrolytes with AM labs  [name], PharmD Pharmacy Resident  03/02/2021 7:30 PM

## 2021-03-02 NOTE — Progress Notes (Signed)
Pharmacy Electrolyte Replacement Consult  Recent Labs:  Recent Labs    03/02/21 0113 03/02/21 0344  K  --  3.3*  MG 1.8  --   PHOS 8.1*  --   CREATININE  --  1.58*    Low Critical Values (K </= 2.5, Phos </= 1, Mg </= 1) Present:  Goal:  Electrolytes WNL  3/25 0344 Na = 138, K = 3.3, Mg 1.8, Phos 8.1, Corr Ca 8   Plan: Ordered IV: KCl 10 mEq x 2 doses Mag 2gm x 1 dose Ca Gluc 1 gm x 1 dose  Pharmacy will assess and address elevated Phos during AM rounds.  Will recheck electrolytes with tomorrow AM labs.  [name], PharmD, [facility] 03/02/2021 5:07 AM

## 2021-03-02 NOTE — Progress Notes (Signed)
[place] Hospital Day(s): 9.   Post op day(s): 1 Day Post-Op.   Interval History:  Patient seen and examined; remains intubated/sedated She is on vasopressor support:  - Levophed: 22 mcg/min  - Phenylephrine: 50  Mcg/min  - Vasopressin: 0.03 units Most recent labs show leukocytosis to 32.5K; no fevers Lactic acidosis to 7.0 but now improved to 1.9 this morning Renal function appears to have remained baseline; sCr - 1.58, UO - 720 ccs Previous seen hypokalemia improving, now 3.3 No other significant electrolyte derangements NGT with 160 ccs out Surgical drain with 410 ccs out since placement; serosanguinous Colostomy was NOT matured intra-operatively Continues on cefepime and vancomycin    Vital signs in last 24 hours: [min-max] current  Temp:  [93.92 F (34.4 C)-98.96 F (37.2 C)] 98.78 F (37.1 C) (03/25 0700) Pulse Rate:  [85-140] 100 (03/25 0700) Resp:  [12-36] 12 (03/25 0700) BP: (43-116)/(22-61) 102/28 (03/24 2115) SpO2:  [91 %-100 %] 95 % (03/25 0700) Arterial Line BP: (104-143)/(47-67) 104/50 (03/25 0700) FiO2 (%):  [50 %-60 %] 50 % (03/25 0700) Weight:  [67.2 kg] 67.2 kg (03/24 1511)     Height: $Remove'5\' 4"'GzUkNzQ$  (162.6 cm) Weight: 67.2 kg BMI (Calculated): 25.42   Intake/Output last 2 shifts:  03/24 0701 - 03/25 0700 In: 7133.8 [I.V.:4314.7; Blood:740; IV Piggyback:2074.1] Out: 1910 [Urine:720; Emesis/NG output:160; Drains:430; Blood:500]   Physical Exam:  Constitutional: Intubated, sedated  Respiratory: On ventilator Cardiovascular: regular rate and sinus rhythm  Gastrointestinal: soft, unable to assess tenderness, non-distended. Colostomy in the LUQ which is NOT matured, colostomy appliance in place, there is serosanguinous drainage in bag. Surgical drain in the the RLQ, serosanguinous output Genitourinary: Foley in palce Integumentary: Laparotomy incision is CDI with staples, wicks in place, serosanguinous drainage, no  erythema   Labs:  CBC Latest Ref Rng & Units 03/02/2021 03/01/2021 03/01/2021  WBC 4.0 - 10.5 K/uL 32.5(H) 27.1(H) 28.2(H)  Hemoglobin 12.0 - 15.0 g/dL 9.8(L) 6.1(L) 11.2(L)  Hematocrit 36.0 - 46.0 % 29.4(L) 18.7(L) 32.2(L)  Platelets 150 - 400 K/uL 187 237 334   CMP Latest Ref Rng & Units 03/02/2021 03/01/2021 03/01/2021  Glucose 70 - 99 mg/dL 189(H) 146(H) -  BUN 8 - 23 mg/dL 33(H) 29(H) -  Creatinine 0.44 - 1.00 mg/dL 1.58(H) 1.44(H) -  Sodium 135 - 145 mmol/L 138 139 -  Potassium 3.5 - 5.1 mmol/L 3.3(L) 3.7 3.4(L)  Chloride 98 - 111 mmol/L 105 111 -  CO2 22 - 32 mmol/L 19(L) 17(L) -  Calcium 8.9 - 10.3 mg/dL 6.8(L) 6.9(L) -  Total Protein 6.5 - 8.1 g/dL 4.6(L) 3.7(L) -  Total Bilirubin 0.3 - 1.2 mg/dL 1.3(H) 0.7 -  Alkaline Phos 38 - 126 U/L 58 55 -  AST 15 - 41 U/L 62(H) 34 -  ALT 0 - 44 U/L 16 12 -     Imaging studies: No new pertinent imaging studies   Assessment/Plan:  70 y.o. critically ill female, requiring vasopressor support, 1 Day Post-Op s/p Hartman's Procedure for complicated diverticulitis with gangrenous changes and perforation with feculent peritonitis.   - She will require nutritional support at this point, we can initiate trickle feeds; will discuss with dietician  - Appreciate PCCM assistance; vasopressor and ventilator support    - Continue Abx (Vancomycin, cefepime)  - Monitor abdominal examination  - Monitor colostomy; this is not matured  - Monitor leukocytosis; worsening; somewhat expectedly  - Maintain foley catheter   - Midline wound care: Leave wicks in  place, cover with dry gauze and ABD pads   All of the above findings and recommendations were discussed with the medical team.  -- Edison Simon, PA-C [facility] 03/02/2021, 8:01 AM [phone] M-F: 7am - 4pm

## 2021-03-02 NOTE — Progress Notes (Signed)
Nutrition Follow-up  DOCUMENTATION CODES:   Not applicable  INTERVENTION:   Once medically stable for nutrition support, recommend:  Initiate Vital HP at trickle rate of 73ml/hr- once patient is tolerating recommend increase by 33ml/hr q 8 hours until goal rate of 24ml/hr is reached.   Free water flushes 53ml q4 hours to maintain tube patency   Regimen provides 1320kcal/day, 115g/day protein and 1236ml/day free water  Pt at high refeed risk; recommend monitor potassium, magnesium and phosphorus labs daily until stable  If unable to initiate tube feeds, recommend TPN per pharmacy  NUTRITION DIAGNOSIS:   Inadequate oral intake related to acute illness as evidenced by NPO status.  GOAL:   Provide needs based on ASPEN/SCCM guidelines  MONITOR:   Vent status,Labs,Weight trends,Skin,I & O's  ASSESSMENT:   70 y.o. female with h/o HTN, CKD III, colovesical fistula, hypothroidism and HLD who is admitted with diverticulitis and constipation.   Pt s/p sigmoid colectomy with end colostomy, Hartman's procedure, splenic flexure mobilization and fistula repair 3/24  Pt remains sedated and ventilated after surgery. Pt on multiple pressors. NGT in place to LIS. Ostomy bag with serosanguinous fluid. Per MD note, colostomy was not matured intra-operatively. Pt unstable for nutrition support today. Will plan to start trickle feeds once medically appropriate. Pt is at high refeed risk.   Medications reviewed and include: colace, lovenox, solu-cortef, insulin, lactulose, synthroid, MVI, protonix, miralax, cefepime, fentanyl, metronidazole, levophed, phenylephrine, propofol, Na bicarbonate, vancomycin, vasopressin   Labs reviewed: K 3.3(L), BUN 33(H), creat 1.58(H) P 8.1(H), Mg 1.8 wnl- 3/25 Wbc- 32.5(H), Hgb 9.8(L), Hct 29.4(L) cbgs- 150, 146, 162 x 24hrs  Patient is currently intubated on ventilator support MV: 6.3 L/min Temp (24hrs), Avg:97 F (36.1 C), Min:93.92 F (34.4 C), Max:98.96  F (37.2 C)  Propofol: 16.1 ml/hr- provides 425kcal/day   MAP- 55-62mmHg  UOP- 734ml  NGT output- 137ml  Drains- 432ml  NUTRITION - FOCUSED PHYSICAL EXAM:  Flowsheet Row Most Recent Value  Orbital Region No depletion  Upper Arm Region Moderate depletion  Thoracic and Lumbar Region No depletion  Buccal Region Mild depletion  Temple Region Moderate depletion  Clavicle Bone Region No depletion  Clavicle and Acromion Bone Region No depletion  Scapular Bone Region No depletion  Dorsal Hand No depletion  Patellar Region No depletion  Anterior Thigh Region No depletion  Posterior Calf Region No depletion  Edema (RD Assessment) Mild  Hair Reviewed  Eyes Reviewed  Mouth Reviewed  Skin Reviewed  Nails Reviewed     Diet Order:   Diet Order            Diet NPO time specified  Diet effective now                EDUCATION NEEDS:   Not appropriate for education at this time  Skin:  Skin Assessment: Reviewed RN Assessment (ecchymosis)  Last BM:  3/25- 130ml bloody fluid  Height:   Ht Readings from Last 1 Encounters:  03/01/21 $RemoveB'5\' 4"'qxmfjqvE$  (1.626 m)    Weight:   Wt Readings from Last 1 Encounters:  03/01/21 67.2 kg    Ideal Body Weight:  54.5 kg  BMI:  Body mass index is 25.43 kg/m.  Estimated Nutritional Needs:   Kcal:  1330kcal/day  Protein:  95-110g/day  Fluid:  1.4-1.7L/day  Koleen Distance MS, RD, LDN Please refer to [facility] for RD and/or RD on-call/weekend/after hours pager

## 2021-03-02 NOTE — Progress Notes (Signed)
   Subjective Status post exploratory laparotomy with general surgery yesterday for perforated diverticulitis and pelvic abscess as well as colovesical fistula.  Consulted intraoperatively for 1 cm cystorrhaphy.  Remains intubated and sedated and critically ill.  Excellent urine output this morning, drain serous  Rising troponin, cardiology consulted  Physical Exam: BP (!) 102/28   Pulse 100   Temp 98.78 F (37.1 C) (Esophageal)   Resp 12   Ht $R'5\' 4"'Wq$  (1.626 m)   Wt 67.2 kg   SpO2 95%   BMI 25.43 kg/m    Constitutional: Intubated and sedated Abdominal dressing in place Non-matured colostomy LUQ with appliance in place JP drain serous Foley with clear yellow urine, ~700cc/UOP over last 8 hours  I/O reviewed in epic  Laboratory Data: Reviewed  Assessment & Plan:   70 year old female with perforated diverticulitis, colovesical fistula, and pelvic abscess POD#1 s/p exploratory laparotomy for feculent peritonitis with Hartman's procedure.  Urology was consulted intraoperatively for 1 cm colovesical fistula, and 2 layer watertight cystorrhaphy was performed.  Foley catheter in place draining clear yellow urine with good urine output.  Remains critically ill.  Maintain Foley 2 to 4 weeks, will arrange outpatient urology follow-up for cystogram and Foley removal Secure chat/call with any questions   Billey Co, MD

## 2021-03-02 NOTE — Progress Notes (Signed)
Peripherally Inserted Central Catheter Placement  The IV Nurse has discussed with the patient and/or persons authorized to consent for the patient, the purpose of this procedure and the potential benefits and risks involved with this procedure.  The benefits include less needle sticks, lab draws from the catheter, and the patient may be discharged home with the catheter. Risks include, but not limited to, infection, bleeding, blood clot (thrombus formation), and puncture of an artery; nerve damage and irregular heartbeat and possibility to perform a PICC exchange if needed/ordered by physician.  Alternatives to this procedure were also discussed.  Bard Power PICC patient education guide, fact sheet on infection prevention and patient information card has been provided to patient /or left at bedside.    PICC Placement Documentation  PICC Triple Lumen 33/74/45 PICC Right Basilic 36 cm 0 cm (Active)  Indication for Insertion or Continuance of Line Vasoactive infusions 03/02/21 1357  Exposed Catheter (cm) 0 cm 03/02/21 1357  Site Assessment Clean;Dry;Intact 03/02/21 1357  Lumen #1 Status Flushed;Saline locked;Blood return noted 03/02/21 1357  Lumen #2 Status Flushed;Saline locked;Blood return noted 03/02/21 1357  Lumen #3 Status Flushed;Saline locked;Blood return noted 03/02/21 1357  Dressing Type Transparent;Securing device 03/02/21 1357  Dressing Status Clean;Dry;Intact 03/02/21 1357  Antimicrobial disc in place? Yes 03/02/21 1357  Dressing Intervention New dressing 03/02/21 1357  Dressing Change Due 03/09/21 03/02/21 1357   Consent signed by daughter, Annabelle Harman, via telephone, verified by 2 RNs.    Enos Fling 03/02/2021, 2:00 PM

## 2021-03-02 NOTE — Consult Note (Signed)
Pharmacy Antibiotic Note  Desiree Madden is a 70 y.o. female admitted on 02/21/2021 with intra-abdominal infection (diverticulitis w/ fecal perforation). Patient septic with acute diverticulitis abdominal abscess with colovesical fistula and enterococcus urinary tract infection which was further complicated by the development of complicated diverticulitis with perforation and gangrenous changes requiring Hartman's procedure performed on 03/01/21. Pt developed severe septic shock after procedure requiring vasopressors. PMH includes CAD s/p CABG (2015), HTN, hypothyroidism, colon bladder fistula, diverticulitis, and ulcerative colitis. Pharmacy has been consulted for vancomycin dosing.  Plan: -- Change Vancomycin to 1500mg  q48h ---- Estimated AUC 550, Cmin 11.3 -- Continue Cefepime 2g q12h -- Continue Metronidazole 500mg  IV q8h -- Monitor renal function daily  Height: 5\' 4"  (162.6 cm) Weight: 67.2 kg (148 lb 2.4 oz) IBW/kg (Calculated) : 54.7  Temp (24hrs), Avg:97 F (36.1 C), Min:93.92 F (34.4 C), Max:98.96 F (37.2 C)  Recent Labs  Lab 02/25/21 0803 02/26/21 0823 02/27/21 0425 02/28/21 3953 03/01/21 0622 03/01/21 2022 03/01/21 2154 03/02/21 0109 03/02/21 0113 03/02/21 0333 03/02/21 0344 03/02/21 0610  WBC 24.5*   < > 17.5* 21.2* 28.2* 27.1*  --   --  32.5*  --   --   --   CREATININE 1.57*  --   --  1.49* 1.63* 1.44*  --   --   --   --  1.58*  --   LATICACIDVEN  --   --   --   --   --  7.0* 6.8* 4.0*  --  2.6*  --  1.9   < > = values in this interval not displayed.    Estimated Creatinine Clearance: 31.2 mL/min (A) (by C-G formula based on SCr of 1.58 mg/dL (H)).    Allergies  Allergen Reactions  . Baclofen     Other reaction(s): Headache  . Sertraline     Other reaction(s): Other (See Comments), Other (See Comments) Muscle aches Muscle aches   . Sulfa Antibiotics     Other reaction(s): Other (See Comments), Other (See Comments) Pt was told not to take it due to  colitis Pt was told not to take it due to colitis   . Ibuprofen Nausea And Vomiting  . Trazodone And Nefazodone     Antimicrobials this admission: Vancomycin  3/24>>  Cefepime  3/24 >>  Metronidazole 3/16>>3/18, 3/24>> Cipro 3/16 >> 3/19 Zosyn 3/18>>3/24 Unasyn 3/23 x 1   Microbiology results: 3/23 abdominal abscess: pending 3/24 BCx: NGTD 3/24 abdominal abscess/deep wound: Moderat GPCs, GPRs, gram variable rods  3/24 MRSA PCR: negative 3/16 UCx: E.faecium (NTF-Int; Amp & Vanc - Sens) & E. Faecalis (Amp,NTF,Vanc-Sens)  Thank you for allowing pharmacy to be a part of this patient's care.  [name], PharmD Pharmacy Resident  03/02/2021 1:57 PM

## 2021-03-02 NOTE — Consult Note (Signed)
NAME:  Desiree Madden, MRN:  [id number], DOB:  [birth year], LOS: 9 ADMISSION DATE:  02/21/2021, CONSULTATION DATE:  03/01/2021 REFERRING MD:  Dr. Christian Mate, CHIEF COMPLAINT:  Septic shock   Brief Pt Description:  70 y.o. Female admitted 02/21/2021 due to Diverticulitis with Colovesical fistula and UTI. On 03/01/21 she developed complicated Diverticulitis with perforation requiring Sigmoid colectomy with end colostomy and Hartman's procedure.  Post procedure she returns to ICU and remains intubated with Septic Shock requiring multiple vasopressors.  History of Present Illness:  Desiree Madden is a 70 y.o. Female with a past medical history as listed below who presented to [facility] ED on 02/21/2021 with severe lower abdominal pain and dysuria. She denied fever, chills, or vomiting.  Of note she had been on and off antibiotics over the past several months for recurrent UTI's related to her Colovesical fistula. She was supposed to have surgery for the fistula in the fall of 2021, but it never occurred.  ED Course: CT Abdomen & Pelvis showed evidence of possible diverticulitis and constipation, could not rule out colonic neoplasm.  She was admitted to the Med-Surg unit by the Hospitalist for further workup and treatment of Diverticulitis and UTI.  GI and General Surgery were consulted.  Hospital Course: Pt elected to try medical alternatives instead of proceeding with surgery.  However on 02/28/21 she had worsening fever and Leukocytosis. Repeat CT Abdomen/Pelvis showed an abscess with likely fistula formation, of which a Percutaneous Drain was placed by IR. Despite drain placement, on 03/01/21 her leukocytosis continued to worsen.  Decision was made to proceed with surgery.  She was found to have complicated Diverticulitis with perforation and gangrene of the sigmoid colon, she required a sigmoid colectomy with end colostomy and Hartman's procedure.  She returns to ICU post procedure with severe septic shock  and remains intubated.  Pertinent  Medical History  Hypertension Hypothyroidism Ulcerative colitis Diverticulitis Colon-Bladder fistula  Significant Hospital Events: Including procedures, antibiotic start and stop dates in addition to other pertinent events   . 02/21/2021: Admitted to Med/Surg unit with Diverticulitis and UTI . 02/28/2021: LLQ abdominal drain placed by IR to pelvic abscess . 03/01/2021: Worsening Leukocytosis despite drain placement. Found to have complicated Diverticulitis with perforation requiring Sigmoid Colectomy with end colostomy and Hartman's Procedure.  Post procedure she returns to ICU and remains intubated with Septic Shock requiring multiple vasopressors.  Left Femoral CVC and Arterial lines placed emergently.    Cultures:  02/21/21: SARS CoV-2 PCR>>negative 02/21/21: Influenza PCR>>negative 02/21/2021: Urine>>ENTEROCOCCUS FAECALIS,  ENTEROCOCCUS FAECIUM(sensitive to vancomycin and ampicillin) 02/22/2021: HIV screen>> nonreactive 02/28/2021: LLQ Abdominal drain>> 03/01/2021: MRSA PCR>> negative 03/01/2021: Blood culture x2>>  Antimicrobials:  Ciprofloxacin 3/16>>3/18 Unasyn 3/23>>3/23 Flagyl 3/16>>3/18; 3/24>> Cefepime 3/24>> Vancomycin 3/24>>  Interim History / Subjective:  -Pt is s/p Sigmoid colectomy with end colostomy and Harman's procedure due to complicated diverticulitis with perforation -Returns to ICU, remains intubated, severe septic shock -Giving Bicarb, starting vasopressors, stress dose steroids -Critically ill  Objective   Blood pressure (!) 102/28, pulse 86, temperature 98.78 F (37.1 C), resp. rate 12, height 5' 4" (1.626 m), weight 67.2 kg, SpO2 96 %.    Vent Mode: PRVC FiO2 (%):  [50 %-60 %] 50 % Set Rate:  [12 bmp] 12 bmp Vt Set:  [450 mL] 450 mL PEEP:  [5 cmH20] 5 cmH20 Plateau Pressure:  [18 cmH20] 18 cmH20   Intake/Output Summary (Last 24 hours) at 03/02/2021 1711 Last data filed at 03/02/2021 1557 Gross per 24 hour  Intake  4440.42 ml  Output 1840 ml  Net 2600.42 ml   Filed Weights   02/27/21 0900 02/28/21 1014 03/01/21 1511  Weight: 66.9 kg 67.2 kg 67.2 kg    Examination: General: Critically ill appearing female, laying in bed, intubated and sedated, in NAD HENT: Atraumatic, normocephalic, neck supple, no JVD Lungs: Mechanical breath sounds, synchronous with the vent, even Cardiovascular: Tachycardia, regular rhythm, s1s2, no M/R/G Abdomen: Soft, distended, tender, Midline abdominal dressing clean dry and intact, end ostomy  Extremities: No deformities, no clubbing, no edema Neuro: Sedated, withdraws from pain, pupils PERRL GU: Foley catheter in place draining yellow urine  Labs/imaging that I havepersonally reviewed   Bicarb 17, Glucose 146, BUN 29, Cr. 1.44, Albumin 1.7, High sensitivity troponin 63, Lactic acid 7.0, WBC 27.1 w/ Neutrophilia, RBC 2, Hgb 6.1, PT 19.6, INR 1.7, APTT 47 03/01/21: CXR>>The heart size and mediastinal contours are within normal limits. ETT is 1.1 cm above the carina. Overlying median sternotomy wires and surgical are present. There is prominence of the central pulmonary vasculature. No acute osseous abnormality. 03/01/21: KUB>>OG tube tip is in the stomach. Nonobstructive bowel gas pattern. No free air.  Resolved Hospital Problem list     Assessment & Plan:   Septic shock +/- Hemorrhagic shock PMHx of Hypertension -Continuous cardiac monitoring -Maintain MAP >65 -MaintainIV fluid resuscitation -Transfusions as indicated -Vasopressors as needed to maintain MAP goal (Currently on Levophed and Vasopressin) -IV albumin given -3 amps Bicarb for severe metabolic acidosis given -Stress dose steroids -Trend lactic acid -Check troponin -Echocardiogram pending   Severe Sepsis in the setting of Complicated Diverticulitis/Pelvic Abscess with Perforation & Gangrene of the Sigmoid Colon, Colovesical Fistula -Monitor fever curve -Trend WBC's & Procalcitonin -Follow  cultures as above -Continue Cefepime, Flagyl, & Vancomycin -General Surgery following, appreciate input -S/p Sigmoid Colectomy with end colostomy, Hartman's Procedure on 03/01/21, along with Cystorrhaphy   Post-Op Respiratory Failure due to Septic Shock -Full vent support -Wean FiO2 & PEEP as tolerated to maintain O2 sats >92% -Follow intermittent CXR and ABG as needed -Implement VAP bundle, 6cc/kg LPVS -Spontaneous breathing trials when respiratory parameters met and mental status permits -Prn Bronchodilators   CKD Stage III b Metabolic Acidosis in setting of Lactic acidosis Lab Results  Component Value Date   CREATININE 1.58 (H) 03/02/2021   BUN 33 (H) 03/02/2021   NA 138 03/02/2021   K 3.3 (L) 03/02/2021   CL 105 03/02/2021   CO2 19 (L) 03/02/2021    Intake/Output Summary (Last 24 hours) at 03/02/2021 1711 Last data filed at 03/02/2021 1557 Gross per 24 hour  Intake 4440.42 ml  Output 1840 ml  Net 2600.42 ml   -Monitor I&O's / urinary output -Follow BMP -Ensure adequate renal perfusion -Avoid nephrotoxic agents as able -Replace electrolytes as indicated -Bicarb gtt to continue -Trend lactic acid   Normocytic Normochromic Anemia (Acute Blood Loss) EBL in surgery 350 ml -Monitor for S/Sx of bleeding -Trend CBC -SCD's for VTE Prophylaxis (can resume chemical prophylaxis when approved by Surgery) -Transfuse for Hgb <8 -Hgb 6.1 post procedure, will give 2 units pRBC's   Hyperglycemia -CBG's -SSI -Follow ICU Hypo/Hyperglycemia protocol     Best practice (evaluated daily)  Diet:  NPO Pain/Anxiety/Delirium protocol (if indicated): Yes (RASS goal -1) VAP protocol (if indicated): Yes DVT prophylaxis: LMWH GI prophylaxis: PPI Glucose control:  SSI Yes Central venous access:  Yes, and it is still needed Arterial line:  Yes, and it is still needed Foley:  Yes, and it is still  needed Mobility:  bed rest  PT consulted: N/A Last date of multidisciplinary goals  of care discussion [N/A] Code Status:  full code Disposition: ICU  Labs   CBC: Recent Labs  Lab 02/27/21 0425 02/28/21 0613 03/01/21 0622 03/01/21 2022 03/02/21 0113  WBC 17.5* 21.2* 28.2* 27.1* 32.5*  NEUTROABS  --   --   --  24.5* 30.7*  HGB 9.6* 9.9* 11.2* 6.1* 9.8*  HCT 27.8* 28.5* 32.2* 18.7* 29.4*  MCV 86.6 86.6 85.4 89.9 89.1  PLT 279 231 334 237 774    Basic Metabolic Panel: Recent Labs  Lab 02/25/21 0803 02/28/21 0613 03/01/21 0622 03/01/21 1411 03/01/21 2022 03/02/21 0113 03/02/21 0344  NA 133* 140 137  --  139  --  138  K 3.5 3.2* 2.7* 3.4* 3.7  --  3.3*  CL 110 115* 110  --  111  --  105  CO2 16* 17* 15*  --  17*  --  19*  GLUCOSE 123* 88 124*  --  146*  --  189*  BUN 35* 29* 30*  --  29*  --  33*  CREATININE 1.57* 1.49* 1.63*  --  1.44*  --  1.58*  CALCIUM 7.9* 7.7* 7.5*  --  6.9*  --  6.8*  MG  --   --  1.9  --   --  1.8  --   PHOS  --   --  5.2*  --   --  8.1*  --    GFR: Estimated Creatinine Clearance: 31.2 mL/min (A) (by C-G formula based on SCr of 1.58 mg/dL (H)). Recent Labs  Lab 02/28/21 [phone] 03/01/21 0622 03/01/21 2022 03/01/21 2154 03/02/21 0109 03/02/21 0113 03/02/21 0333 03/02/21 0610  PROCALCITON  --   --  7.56  --   --  20.51  --   --   WBC 21.2* 28.2* 27.1*  --   --  32.5*  --   --   LATICACIDVEN  --   --  7.0* 6.8* 4.0*  --  2.6* 1.9    Liver Function Tests: Recent Labs  Lab 03/01/21 2022 03/02/21 0344  AST 34 62*  ALT 12 16  ALKPHOS 55 58  BILITOT 0.7 1.3*  PROT 3.7* 4.6*  ALBUMIN 1.7* 2.5*   No results for input(s): LIPASE, AMYLASE in the last 168 hours. No results for input(s): AMMONIA in the last 168 hours.  ABG    Component Value Date/Time   PHART 7.32 (L) 03/02/2021 0800   PCO2ART 42 03/02/2021 0800   PO2ART 87 03/02/2021 0800   HCO3 21.6 03/02/2021 0800   ACIDBASEDEF 4.3 (H) 03/02/2021 0800   O2SAT 95.8 03/02/2021 0800     Coagulation Profile: Recent Labs  Lab 03/01/21 2022 03/02/21 0333  INR  1.7* 1.5*    Cardiac Enzymes: No results for input(s): CKTOTAL, CKMB, CKMBINDEX, TROPONINI in the last 168 hours.  HbA1C: No results found for: HGBA1C  CBG: Recent Labs  Lab 03/01/21 2324 03/02/21 0352 03/02/21 0737 03/02/21 1117 03/02/21 1607  GLUCAP 177* 150* 146* 162* 158*    Review of Systems:   Unable to assess due to critical illness, intubation/sedation  Past Medical History:  She,  has a past medical history of Hypertension and Thyroid disease.   Surgical History:   Past Surgical History:  Procedure Laterality Date  . CARDIAC SURGERY    . COLECTOMY WITH COLOSTOMY CREATION/HARTMANN PROCEDURE Left 03/01/2021   Procedure: COLECTOMY WITH COLOSTOMY CREATION/HARTMANN PROCEDURE;  Surgeon: Ronny Bacon, MD;  Location: [facility] ORS;  Service: General;  Laterality: Left;     Social History:   reports that she has quit smoking. She has never used smokeless tobacco. She reports that she does not drink alcohol and does not use drugs.   Family History:  Her family history includes Arthritis in an other family member; CAD in her mother.   Allergies Allergies  Allergen Reactions  . Baclofen     Other reaction(s): Headache  . Sertraline     Other reaction(s): Other (See Comments), Other (See Comments) Muscle aches Muscle aches   . Sulfa Antibiotics     Other reaction(s): Other (See Comments), Other (See Comments) Pt was told not to take it due to colitis Pt was told not to take it due to colitis   . Ibuprofen Nausea And Vomiting  . Trazodone And Nefazodone      Home Medications  Prior to Admission medications   Medication Sig Start Date End Date Taking? Authorizing Provider  alendronate (FOSAMAX) 70 MG tablet Take 70 mg by mouth once a week. Take with a full glass of water on an empty stomach.   Yes [provider]  aspirin EC 81 MG tablet Take 81 mg by mouth daily.   Yes [provider]  atorvastatin (LIPITOR) 20 MG tablet Take 20 mg by mouth  daily.   Yes [provider]  diclofenac Sodium (VOLTAREN) 1 % GEL Apply topically 4 (four) times daily.   Yes [provider]  fluticasone (FLONASE) 50 MCG/ACT nasal spray Place 2 sprays into both nostrils daily.   Yes [provider]  gabapentin (NEURONTIN) 300 MG capsule Take 300 mg by mouth 3 (three) times daily.   Yes [provider]  levothyroxine (SYNTHROID) 75 MCG tablet Take 75 mcg by mouth daily before breakfast.   Yes [provider]  meloxicam (MOBIC) 15 MG tablet Take 15 mg by mouth daily.   Yes [provider]  metoprolol succinate (TOPROL-XL) 50 MG 24 hr tablet Take 50 mg by mouth daily. 11/23/20  Yes [provider]  oxyCODONE (OXY IR/ROXICODONE) 5 MG immediate release tablet Take 5 mg by mouth every 12 (twelve) hours as needed.   Yes [provider]  traZODone (DESYREL) 100 MG tablet Take 50-100 mg by mouth at bedtime. Take 1/2 tablet by mouth each evening for sleep, can increase to 165m whole tablet after 1st week.   Yes [provider]     critical care time 432

## 2021-03-03 ENCOUNTER — Inpatient Hospital Stay: Payer: Medicare HMO

## 2021-03-03 DIAGNOSIS — R7989 Other specified abnormal findings of blood chemistry: Secondary | ICD-10-CM | POA: Diagnosis not present

## 2021-03-03 DIAGNOSIS — I214 Non-ST elevation (NSTEMI) myocardial infarction: Secondary | ICD-10-CM

## 2021-03-03 DIAGNOSIS — R9431 Abnormal electrocardiogram [ECG] [EKG]: Secondary | ICD-10-CM | POA: Diagnosis not present

## 2021-03-03 DIAGNOSIS — N1831 Chronic kidney disease, stage 3a: Secondary | ICD-10-CM | POA: Diagnosis not present

## 2021-03-03 DIAGNOSIS — K5792 Diverticulitis of intestine, part unspecified, without perforation or abscess without bleeding: Secondary | ICD-10-CM | POA: Diagnosis not present

## 2021-03-03 LAB — BASIC METABOLIC PANEL
Anion gap: 9 (ref 5–15)
BUN: 34 mg/dL — ABNORMAL HIGH (ref 8–23)
CO2: 28 mmol/L (ref 22–32)
Calcium: 6.7 mg/dL — ABNORMAL LOW (ref 8.9–10.3)
Chloride: 101 mmol/L (ref 98–111)
Creatinine, Ser: 1.3 mg/dL — ABNORMAL HIGH (ref 0.44–1.00)
GFR, Estimated: 44 mL/min — ABNORMAL LOW (ref >60)
Glucose, Bld: 146 mg/dL — ABNORMAL HIGH (ref 70–99)
Potassium: 3.1 mmol/L — ABNORMAL LOW (ref 3.5–5.1)
Sodium: 138 mmol/L (ref 135–145)

## 2021-03-03 LAB — CBC
HCT: 27.2 % — ABNORMAL LOW (ref 36.0–46.0)
Hemoglobin: 9.4 g/dL — ABNORMAL LOW (ref 12.0–15.0)
MCH: 29.6 pg (ref 26.0–34.0)
MCHC: 34.6 g/dL (ref 30.0–36.0)
MCV: 85.5 fL (ref 80.0–100.0)
Platelets: 147 10*3/uL — ABNORMAL LOW (ref 150–400)
RBC: 3.18 MIL/uL — ABNORMAL LOW (ref 3.87–5.11)
RDW: 14.6 % (ref 11.5–15.5)
WBC: 36.3 10*3/uL — ABNORMAL HIGH (ref 4.0–10.5)
nRBC: 0.1 % (ref 0.0–0.2)

## 2021-03-03 LAB — PHOSPHORUS: Phosphorus: 4.4 mg/dL (ref 2.5–4.6)

## 2021-03-03 LAB — GLUCOSE, CAPILLARY
Glucose-Capillary: 140 mg/dL — ABNORMAL HIGH (ref 70–99)
Glucose-Capillary: 139 mg/dL — ABNORMAL HIGH (ref 70–99)
Glucose-Capillary: 143 mg/dL — ABNORMAL HIGH (ref 70–99)
Glucose-Capillary: 141 mg/dL — ABNORMAL HIGH (ref 70–99)
Glucose-Capillary: 182 mg/dL — ABNORMAL HIGH (ref 70–99)
Glucose-Capillary: 155 mg/dL — ABNORMAL HIGH (ref 70–99)

## 2021-03-03 LAB — POTASSIUM
Potassium: 3 mmol/L — ABNORMAL LOW (ref 3.5–5.1)
Potassium: 3.3 mmol/L — ABNORMAL LOW (ref 3.5–5.1)

## 2021-03-03 LAB — MAGNESIUM
Magnesium: 1.9 mg/dL (ref 1.7–2.4)
Magnesium: 2.4 mg/dL (ref 1.7–2.4)
Magnesium: 2.2 mg/dL (ref 1.7–2.4)

## 2021-03-03 LAB — TROPONIN I (HIGH SENSITIVITY)
Troponin I (High Sensitivity): 17376 ng/L (ref <18)
Troponin I (High Sensitivity): 18167 ng/L (ref <18)

## 2021-03-03 LAB — PROCALCITONIN: Procalcitonin: 26.93 ng/mL

## 2021-03-03 MED ORDER — POTASSIUM CHLORIDE 20 MEQ PO PACK
40.0000 meq | PACK | Freq: Once | ORAL | Status: AC
  Administered 2021-03-03: 40 meq
  Filled 2021-03-03: qty 2

## 2021-03-03 MED ORDER — CALCIUM GLUCONATE-NACL 1-0.675 GM/50ML-% IV SOLN
1.0000 g | Freq: Once | INTRAVENOUS | Status: AC
  Administered 2021-03-03: 1000 mg via INTRAVENOUS
  Filled 2021-03-03: qty 50

## 2021-03-03 MED ORDER — VANCOMYCIN HCL 750 MG/150ML IV SOLN
750.0000 mg | INTRAVENOUS | Status: AC
  Administered 2021-03-04 – 2021-03-06 (×3): 750 mg via INTRAVENOUS
  Filled 2021-03-03 (×3): qty 150

## 2021-03-03 MED ORDER — VITAL HIGH PROTEIN PO LIQD
1000.0000 mL | ORAL | Status: DC
  Administered 2021-03-03: 1000 mL

## 2021-03-03 MED ORDER — MAGNESIUM SULFATE 2 GM/50ML IV SOLN
2.0000 g | Freq: Once | INTRAVENOUS | Status: AC
  Administered 2021-03-03: 2 g via INTRAVENOUS
  Filled 2021-03-03 (×2): qty 50

## 2021-03-03 MED ORDER — POTASSIUM CHLORIDE 10 MEQ/50ML IV SOLN
10.0000 meq | INTRAVENOUS | Status: AC
  Administered 2021-03-03 (×4): 10 meq via INTRAVENOUS
  Filled 2021-03-03 (×4): qty 50

## 2021-03-03 MED ORDER — POTASSIUM CHLORIDE 20 MEQ PO PACK
20.0000 meq | PACK | ORAL | Status: AC
  Administered 2021-03-03 (×2): 20 meq
  Filled 2021-03-03 (×2): qty 1

## 2021-03-03 MED ORDER — FREE WATER
30.0000 mL | Status: DC
  Administered 2021-03-03 – 2021-03-06 (×18): 30 mL

## 2021-03-03 NOTE — Progress Notes (Signed)
CC: perf diverticultiis Subjective: S/p hartmann's perforated diverticulitis. He is now weaning off vasopressors and is seen vasopressin and Neo-Synephrine.  White count is 36,000 that is better.  Her creatinine continues to trend in the right direction  Objective: Vital signs in last 24 hours: Temp:  [97.7 F (36.5 C)-98.96 F (37.2 C)] 98.24 F (36.8 C) (03/26 1100) Pulse Rate:  [61-85] 61 (03/26 1100) Resp:  [12-14] 12 (03/26 1100) SpO2:  [96 %-99 %] 96 % (03/26 1100) Arterial Line BP: (99-128)/(48-63) 99/52 (03/26 1100) FiO2 (%):  [50 %] 50 % (03/26 1217) Last BM Date: 02/28/21  Intake/Output from previous day: 03/25 0701 - 03/26 0700 In: 5193.6 [I.V.:4088.3; NG/GT:50; IV Piggyback:1055.3] Out: 2450 [Urine:1075; Emesis/NG output:250; Drains:800; Stool:325] Intake/Output this shift: Total I/O In: 80 [NG/GT:30; IV Piggyback:50] Out: 650 [Urine:350; Drains:300]  Physical exam: Intubated and sedated Abd: soft, , non-distended. Colostomy in the LUQ which is NOT matured, excess colon viable and not distended , there is seros drainage in bag. Surgical drain in the the RLQ, serosanguinous output. Midline wound w staples and wicks in place. No infection or bile Lab Results: CBC  Recent Labs    03/02/21 1812 03/03/21 0510  WBC 40.6* 36.3*  HGB 9.7* 9.4*  HCT 28.3* 27.2*  PLT 184 147*   BMET Recent Labs    03/02/21 1812 03/03/21 0200 03/03/21 0510  NA 139  --  138  K 2.8* 3.0* 3.1*  CL 101  --  101  CO2 23  --  28  GLUCOSE 175*  --  146*  BUN 36*  --  34*  CREATININE 1.43*  --  1.30*  CALCIUM 6.9*  --  6.7*   PT/INR Recent Labs    03/01/21 2022 03/02/21 0333  LABPROT 19.6* 17.2*  INR 1.7* 1.5*   ABG Recent Labs    03/02/21 0048 03/02/21 0800  PHART 7.22* 7.32*  HCO3 14.8* 21.6    Studies/Results: DG Abd 1 View  Result Date: 03/01/2021 CLINICAL DATA:  OG tube placement EXAM: ABDOMEN - 1 VIEW COMPARISON:  02/23/2021 FINDINGS: OG tube tip is in the  stomach. Nonobstructive bowel gas pattern. No free air. IMPRESSION: OG tube tip in the stomach. Electronically Signed   By: Rolm Baptise M.D.   On: 03/01/2021 19:27   DG Chest Port 1 View  Result Date: 03/03/2021 CLINICAL DATA:  Hypoxia EXAM: PORTABLE CHEST 1 VIEW COMPARISON:  03/02/2021 FINDINGS: Endotracheal tube terminates 5.3 cm above the carina. Mild patchy bilateral lower lobe opacities, likely atelectasis, with possible trace left pleural effusion. No frank interstitial edema. No pneumothorax. The heart is normal in size. Postsurgical changes related to prior CABG. Right subclavian venous catheter terminates at the cavoatrial junction. Enteric tube terminates in the proximal stomach. Median sternotomy. IMPRESSION: Mild patchy bilateral lower lobe opacities, likely atelectasis, with possible trace left pleural effusion. No frank interstitial edema. Endotracheal tube terminates 5.3 cm above the carina. Additional support apparatus as above. Electronically Signed   By: Julian Hy M.D.   On: 03/03/2021 05:57   Portable Chest xray  Result Date: 03/02/2021 CLINICAL DATA:  Acute respiratory failure with hypoxia EXAM: PORTABLE CHEST 1 VIEW COMPARISON:  Radiograph 03/01/2021 FINDINGS: Endotracheal tube tip terminates 4 cm from the carina. Transesophageal tube tip and side port distal to the GE junction within the gastric body. Postsurgical changes from sternotomy and CABG with stable cardiomediastinal contours including a calcified, tortuous aorta. Telemetry leads and support devices overlie the chest. Central vascular congestion with hazy interstitial opacities  which could reflect combination of atelectasis and edema. Layering left effusion. No visible right effusion. No pneumothorax. No other acute osseous or soft tissue abnormality. IMPRESSION: 1. Lines and tubes as above. 2. Prior sternotomy and CABG. 3. Hazy opacities likely reflecting combination edema and atelectasis with layering left effusion.  Electronically Signed   By: Lovena Le M.D.   On: 03/02/2021 04:28   DG Chest Port 1 View  Result Date: 03/01/2021 CLINICAL DATA:  ET tube and OG tube placement EXAM: PORTABLE CHEST 1 VIEW COMPARISON:  None. FINDINGS: The heart size and mediastinal contours are within normal limits. ETT is 1.1 cm above the carina. Overlying median sternotomy wires and surgical are present. There is prominence of the central pulmonary vasculature. No acute osseous abnormality. IMPRESSION: ETT is 1.1 cm above the carina. Mild pulmonary vascular congestion. Electronically Signed   By: Prudencio Pair M.D.   On: 03/01/2021 19:41   ECHOCARDIOGRAM COMPLETE  Result Date: 03/02/2021    ECHOCARDIOGRAM REPORT   Patient Name:   Desiree Madden Date of Exam: 03/02/2021 Medical Rec #:  [id number]      Height:       64.0 in Accession #:    [id number]     Weight:       148.1 lb Date of Birth:  [birth year]     BSA:          1.722 m Patient Age:    70 years       BP:           Not listed in chart/Not listed in                                              chart mmHg Patient Gender: F              HR:           85 bpm. Exam Location:  [facility] Procedure: 2D Echo, Cardiac Doppler, Color Doppler and Strain Analysis Indications:     CHF-acute systolic Z32.99  History:         Patient has no prior history of Echocardiogram examinations.                  Risk Factors:Hypertension.  Sonographer:     Sherrie Sport RDCS (AE) Referring Phys:  [id number] Nelle Don Diagnosing Phys: Ida Rogue MD  Sonographer Comments: Global longitudinal strain was attempted. IMPRESSIONS  1. Left ventricular ejection fraction, by estimation, is 50 to 55%. The left ventricle has low normal function. The left ventricle demonstrates regional wall motion abnormalities (Concern for basal to mid inferior wall hypokinesis). Left ventricular diastolic parameters are indeterminate. The average left ventricular global longitudinal strain is -11.3 %. The global longitudinal strain is  abnormal.  2. Right ventricular systolic function is normal. The right ventricular size is normal.  3. Left atrial size was mildly dilated.  4. The mitral valve is normal in structure. Moderate mitral valve regurgitation.  5. Left pleural effusion noted, 7 cm FINDINGS  Left Ventricle: Left ventricular ejection fraction, by estimation, is 50 to 55%. The left ventricle has low normal function. The left ventricle demonstrates regional wall motion abnormalities. The average left ventricular global longitudinal strain is -11.3 %. The global longitudinal strain is abnormal. The left ventricular internal cavity size was normal in size. There is no left ventricular hypertrophy. Left ventricular  diastolic parameters are indeterminate. Right Ventricle: The right ventricular size is normal. No increase in right ventricular wall thickness. Right ventricular systolic function is normal. Left Atrium: Left atrial size was mildly dilated. Right Atrium: Right atrial size was normal in size. Pericardium: There is no evidence of pericardial effusion. Mitral Valve: The mitral valve is normal in structure. Moderate mitral valve regurgitation. No evidence of mitral valve stenosis. Tricuspid Valve: The tricuspid valve is normal in structure. Tricuspid valve regurgitation is mild . No evidence of tricuspid stenosis. Aortic Valve: The aortic valve is normal in structure. Aortic valve regurgitation is not visualized. Mild to moderate aortic valve sclerosis/calcification is present, without any evidence of aortic stenosis. Aortic valve mean gradient measures 3.0 mmHg. Aortic valve peak gradient measures 5.6 mmHg. Aortic valve area, by VTI measures 2.02 cm. Pulmonic Valve: The pulmonic valve was normal in structure. Pulmonic valve regurgitation is not visualized. No evidence of pulmonic stenosis. Aorta: The aortic root is normal in size and structure. Venous: The inferior vena cava is normal in size with greater than 50% respiratory  variability, suggesting right atrial pressure of 3 mmHg. IAS/Shunts: No atrial level shunt detected by color flow Doppler.  LEFT VENTRICLE PLAX 2D LVIDd:         4.78 cm  Diastology LVIDs:         3.43 cm  LV e' medial:    7.07 cm/s LV PW:         0.93 cm  LV E/e' medial:  11.8 LV IVS:        0.77 cm  LV e' lateral:   9.36 cm/s LVOT diam:     2.00 cm  LV E/e' lateral: 8.9 LV SV:         39 LV SV Index:   23       2D Longitudinal Strain LVOT Area:     3.14 cm 2D Strain GLS Avg:     -11.3 %                          3D Volume EF:                         3D EF:        61 %                         LV EDV:       175 ml                         LV ESV:       68 ml                         LV SV:        107 ml RIGHT VENTRICLE RV Basal diam:  2.92 cm LEFT ATRIUM           Index       RIGHT ATRIUM           Index LA diam:      4.30 cm 2.50 cm/m  RA Area:     12.70 cm LA Vol (A4C): 58.8 ml 34.14 ml/m RA Volume:   23.50 ml  13.65 ml/m  AORTIC VALVE                   PULMONIC VALVE  AV Area (Vmax):    1.77 cm    PV Vmax:        0.64 m/s AV Area (Vmean):   1.68 cm    PV Peak grad:   1.6 mmHg AV Area (VTI):     2.02 cm    RVOT Peak grad: 1 mmHg AV Vmax:           118.00 cm/s AV Vmean:          77.250 cm/s AV VTI:            0.192 m AV Peak Grad:      5.6 mmHg AV Mean Grad:      3.0 mmHg LVOT Vmax:         66.60 cm/s LVOT Vmean:        41.300 cm/s LVOT VTI:          0.124 m LVOT/AV VTI ratio: 0.64  AORTA Ao Root diam: 3.10 cm MITRAL VALVE               TRICUSPID VALVE MV Area (PHT): 4.12 cm    TR Peak grad:   24.4 mmHg MV Decel Time: 184 msec    TR Vmax:        247.00 cm/s MV E velocity: 83.60 cm/s MV A velocity: 74.60 cm/s  SHUNTS MV E/A ratio:  1.12        Systemic VTI:  0.12 m                            Systemic Diam: 2.00 cm Julien Nordmann MD Electronically signed by Julien Nordmann MD Signature Date/Time: 03/02/2021/12:35:38 PM    Final    [place] EKG SITE RITE  Result Date: 03/02/2021 If Site Rite image not attached,  placement could not be confirmed due to current cardiac rhythm.   Anti-infectives: Anti-infectives (From admission, onward)   Start     Dose/Rate Route Frequency Ordered Stop   03/04/21 1800  vancomycin (VANCOREADY) IVPB 750 mg/150 mL        750 mg 150 mL/hr over 60 Minutes Intravenous Every 24 hours 03/03/21 1442     03/03/21 0800  vancomycin (VANCOREADY) IVPB 1500 mg/300 mL  Status:  Discontinued        1,500 mg 150 mL/hr over 120 Minutes Intravenous Every 48 hours 03/02/21 1410 03/03/21 1442   03/02/21 0800  vancomycin (VANCOREADY) IVPB 750 mg/150 mL  Status:  Discontinued        750 mg 150 mL/hr over 60 Minutes Intravenous Every 24 hours 03/01/21 1954 03/02/21 1409   03/01/21 2200  ceFEPIme (MAXIPIME) 2 g in sodium chloride 0.9 % 100 mL IVPB        2 g 200 mL/hr over 30 Minutes Intravenous Every 12 hours 03/01/21 1903     03/01/21 2100  vancomycin (VANCOREADY) IVPB 1250 mg/250 mL        1,250 mg 166.7 mL/hr over 90 Minutes Intravenous  Once 03/01/21 1954 03/02/21 0008   03/01/21 2000  metroNIDAZOLE (FLAGYL) IVPB 500 mg        500 mg 100 mL/hr over 60 Minutes Intravenous Every 8 hours 03/01/21 1903     02/28/21 1800  piperacillin-tazobactam (ZOSYN) IVPB 3.375 g  Status:  Discontinued        3.375 g 12.5 mL/hr over 240 Minutes Intravenous Every 8 hours 02/28/21 1643 03/01/21 1903   02/28/21 1730  piperacillin-tazobactam (ZOSYN) IVPB 3.375 g  Status:  Discontinued        3.375 g 100 mL/hr over 30 Minutes Intravenous Every 8 hours 02/28/21 1635 02/28/21 1642   02/28/21 1300  Ampicillin-Sulbactam (UNASYN) 3 g in sodium chloride 0.9 % 100 mL IVPB  Status:  Discontinued        3 g 200 mL/hr over 30 Minutes Intravenous Every 6 hours 02/28/21 1156 02/28/21 1635   02/23/21 1230  piperacillin-tazobactam (ZOSYN) IVPB 3.375 g  Status:  Discontinued        3.375 g 12.5 mL/hr over 240 Minutes Intravenous Every 8 hours 02/23/21 1143 02/28/21 1155   02/22/21 0600  ciprofloxacin (CIPRO) IVPB  400 mg  Status:  Discontinued        400 mg 200 mL/hr over 60 Minutes Intravenous Every 12 hours 02/21/21 2028 02/23/21 1143   02/22/21 0400  metroNIDAZOLE (FLAGYL) IVPB 500 mg  Status:  Discontinued        500 mg 100 mL/hr over 60 Minutes Intravenous Every 8 hours 02/21/21 2028 02/23/21 1143   02/21/21 1830  ciprofloxacin (CIPRO) IVPB 400 mg        400 mg 200 mL/hr over 60 Minutes Intravenous  Once 02/21/21 1820 02/21/21 1939   02/21/21 1830  metroNIDAZOLE (FLAGYL) IVPB 500 mg        500 mg 100 mL/hr over 60 Minutes Intravenous  Once 02/21/21 1820 02/21/21 2110      Assessment/Plan:  sepsis from perforated diverticulitis is status post Hartman's procedure. Continue port care with crystalloids broad-spectrum antibiotics and vasopressors.  May entertain steroids. We will hold off on maturing the colostomy at this time as there is no evidence of obstruction or dilation on end piece of colon. May start trickle feeds Appreciate critical care assistance     Caroleen Hamman, MD, [facility]  03/03/2021

## 2021-03-03 NOTE — Anesthesia Postprocedure Evaluation (Signed)
Anesthesia Post Note  Patient: Desiree Madden  Procedure(s) Performed: COLECTOMY WITH COLOSTOMY CREATION/HARTMANN PROCEDURE (Left )  Patient location during evaluation: SICU Anesthesia Type: General Level of consciousness: sedated Pain management: pain level controlled Vital Signs Assessment: post-procedure vital signs reviewed and stable Respiratory status: patient remains intubated per anesthesia plan and patient on ventilator - see flowsheet for VS Cardiovascular status: stable Postop Assessment: no apparent nausea or vomiting Anesthetic complications: no Comments: Patient was able to wean from levophed, but is still on 54mcg of phenylephrine at this time.  Stable in the ICU.   No complications documented.   Last Vitals:  Vitals:   03/03/21 1000 03/03/21 1100  BP:    Pulse: 75 61  Resp: 13 12  Temp: 36.5 C 36.8 C  SpO2: 97% 96%    Last Pain:  Vitals:   03/03/21 0400  TempSrc:   PainSc: 0-No pain                 Martha Clan

## 2021-03-03 NOTE — Progress Notes (Signed)
[facility] Cardiology    SUBJECTIVE: Intubated sedated on the vent    Vitals:   03/03/21 0800 03/03/21 0900 03/03/21 1000 03/03/21 1100  BP:      Pulse: 64 64 75 61  Resp: $Remo'12 12 13 12  'dzAab$ Temp: 98.24 F (36.8 C) 98.24 F (36.8 C) 97.7 F (36.5 C) 98.24 F (36.8 C)  TempSrc:      SpO2: 97% 97% 97% 96%  Weight:      Height:         Intake/Output Summary (Last 24 hours) at 03/03/2021 1531 Last data filed at 03/03/2021 1507 Gross per 24 hour  Intake 5123.6 ml  Output 2400 ml  Net 2723.6 ml      PHYSICAL EXAM  General: Well developed, well nourished, in no acute distress HEENT:  Normocephalic and atramatic Neck:  No JVD.  Lungs: Clear bilaterally to auscultation and percussion. Heart: HRRR . Normal S1 and S2 without gallops or murmurs.  Abdomen: Bowel sounds are positive, abdomen soft and non-tender  Msk:  Back normal, normal gait. Normal strength and tone for 70. Extremities: No clubbing, cyanosis or edema.   Neuro: Alert and oriented X 3. Psych:  Good affect, responds appropriately   LABS: Basic Metabolic Panel: Recent Labs    03/02/21 0113 03/02/21 0344 03/02/21 1812 03/03/21 0200 03/03/21 0510  NA  --    < > 139  --  138  K  --    < > 2.8* 3.0* 3.1*  CL  --    < > 101  --  101  CO2  --    < > 23  --  28  GLUCOSE  --    < > 175*  --  146*  BUN  --    < > 36*  --  34*  CREATININE  --    < > 1.43*  --  1.30*  CALCIUM  --    < > 6.9*  --  6.7*  MG 1.8  --  1.9  --  2.4  PHOS 8.1*  --   --   --  4.4   < > = values in this interval not displayed.   Liver Function Tests: Recent Labs    03/01/21 2022 03/02/21 0344  AST 34 62*  ALT 12 16  ALKPHOS 55 58  BILITOT 0.7 1.3*  PROT 3.7* 4.6*  ALBUMIN 1.7* 2.5*   No results for input(s): LIPASE, AMYLASE in the last 72 hours. CBC: Recent Labs    03/01/21 2022 03/02/21 0113 03/02/21 1812 03/03/21 0510  WBC 27.1* 32.5* 40.6* 36.3*  NEUTROABS 24.5* 30.7*  --   --   HGB 6.1* 9.8* 9.7* 9.4*  HCT 18.7* 29.4*  28.3* 27.2*  MCV 89.9 89.1 86.3 85.5  PLT 237 187 184 147*   Cardiac Enzymes: No results for input(s): CKTOTAL, CKMB, CKMBINDEX, TROPONINI in the last 72 hours. BNP: Invalid input(s): POCBNP D-Dimer: No results for input(s): DDIMER in the last 72 hours. Hemoglobin A1C: No results for input(s): HGBA1C in the last 72 hours. Fasting Lipid Panel: Recent Labs    03/02/21 0333  TRIG 148   Thyroid Function Tests: No results for input(s): TSH, T4TOTAL, T3FREE, THYROIDAB in the last 72 hours.  Invalid input(s): FREET3 Anemia Panel: No results for input(s): VITAMINB12, FOLATE, FERRITIN, TIBC, IRON, RETICCTPCT in the last 72 hours.  DG Abd 1 View  Result Date: 03/01/2021 CLINICAL DATA:  OG tube placement EXAM: ABDOMEN - 1 VIEW COMPARISON:  02/23/2021 FINDINGS: OG  tube tip is in the stomach. Nonobstructive bowel gas pattern. No free air. IMPRESSION: OG tube tip in the stomach. Electronically Signed   By: Rolm Baptise M.D.   On: 03/01/2021 19:27   DG Chest Port 1 View  Result Date: 03/03/2021 CLINICAL DATA:  Hypoxia EXAM: PORTABLE CHEST 1 VIEW COMPARISON:  03/02/2021 FINDINGS: Endotracheal tube terminates 5.3 cm above the carina. Mild patchy bilateral lower lobe opacities, likely atelectasis, with possible trace left pleural effusion. No frank interstitial edema. No pneumothorax. The heart is normal in size. Postsurgical changes related to prior CABG. Right subclavian venous catheter terminates at the cavoatrial junction. Enteric tube terminates in the proximal stomach. Median sternotomy. IMPRESSION: Mild patchy bilateral lower lobe opacities, likely atelectasis, with possible trace left pleural effusion. No frank interstitial edema. Endotracheal tube terminates 5.3 cm above the carina. Additional support apparatus as above. Electronically Signed   By: Julian Hy M.D.   On: 03/03/2021 05:57   Portable Chest xray  Result Date: 03/02/2021 CLINICAL DATA:  Acute respiratory failure with  hypoxia EXAM: PORTABLE CHEST 1 VIEW COMPARISON:  Radiograph 03/01/2021 FINDINGS: Endotracheal tube tip terminates 4 cm from the carina. Transesophageal tube tip and side port distal to the GE junction within the gastric body. Postsurgical changes from sternotomy and CABG with stable cardiomediastinal contours including a calcified, tortuous aorta. Telemetry leads and support devices overlie the chest. Central vascular congestion with hazy interstitial opacities which could reflect combination of atelectasis and edema. Layering left effusion. No visible right effusion. No pneumothorax. No other acute osseous or soft tissue abnormality. IMPRESSION: 1. Lines and tubes as above. 2. Prior sternotomy and CABG. 3. Hazy opacities likely reflecting combination edema and atelectasis with layering left effusion. Electronically Signed   By: Lovena Le M.D.   On: 03/02/2021 04:28   DG Chest Port 1 View  Result Date: 03/01/2021 CLINICAL DATA:  ET tube and OG tube placement EXAM: PORTABLE CHEST 1 VIEW COMPARISON:  None. FINDINGS: The heart size and mediastinal contours are within normal limits. ETT is 1.1 cm above the carina. Overlying median sternotomy wires and surgical are present. There is prominence of the central pulmonary vasculature. No acute osseous abnormality. IMPRESSION: ETT is 1.1 cm above the carina. Mild pulmonary vascular congestion. Electronically Signed   By: Prudencio Pair M.D.   On: 03/01/2021 19:41   ECHOCARDIOGRAM COMPLETE  Result Date: 03/02/2021    ECHOCARDIOGRAM REPORT   Patient Name:   Desiree Madden Date of Exam: 03/02/2021 Medical Rec #:  [id number]      Height:       64.0 in Accession #:    [id number]     Weight:       148.1 lb Date of Birth:  May 20, 1951     BSA:          1.722 m Patient Age:    70 years       BP:           Not listed in chart/Not listed in                                              chart mmHg Patient Gender: F              HR:           85 bpm. Exam Location:  [facility] Procedure:  2D Echo, Cardiac Doppler,  Color Doppler and Strain Analysis Indications:     CHF-acute systolic R91.63  History:         Patient has no prior history of Echocardiogram examinations.                  Risk Factors:Hypertension.  Sonographer:     Sherrie Sport RDCS (AE) Referring Phys:  [id number] Nelle Don Diagnosing Phys: Ida Rogue MD  Sonographer Comments: Global longitudinal strain was attempted. IMPRESSIONS  1. Left ventricular ejection fraction, by estimation, is 50 to 55%. The left ventricle has low normal function. The left ventricle demonstrates regional wall motion abnormalities (Concern for basal to mid inferior wall hypokinesis). Left ventricular diastolic parameters are indeterminate. The average left ventricular global longitudinal strain is -11.3 %. The global longitudinal strain is abnormal.  2. Right ventricular systolic function is normal. The right ventricular size is normal.  3. Left atrial size was mildly dilated.  4. The mitral valve is normal in structure. Moderate mitral valve regurgitation.  5. Left pleural effusion noted, 7 cm FINDINGS  Left Ventricle: Left ventricular ejection fraction, by estimation, is 50 to 55%. The left ventricle has low normal function. The left ventricle demonstrates regional wall motion abnormalities. The average left ventricular global longitudinal strain is -11.3 %. The global longitudinal strain is abnormal. The left ventricular internal cavity size was normal in size. There is no left ventricular hypertrophy. Left ventricular diastolic parameters are indeterminate. Right Ventricle: The right ventricular size is normal. No increase in right ventricular wall thickness. Right ventricular systolic function is normal. Left Atrium: Left atrial size was mildly dilated. Right Atrium: Right atrial size was normal in size. Pericardium: There is no evidence of pericardial effusion. Mitral Valve: The mitral valve is normal in structure. Moderate mitral valve regurgitation.  No evidence of mitral valve stenosis. Tricuspid Valve: The tricuspid valve is normal in structure. Tricuspid valve regurgitation is mild . No evidence of tricuspid stenosis. Aortic Valve: The aortic valve is normal in structure. Aortic valve regurgitation is not visualized. Mild to moderate aortic valve sclerosis/calcification is present, without any evidence of aortic stenosis. Aortic valve mean gradient measures 3.0 mmHg. Aortic valve peak gradient measures 5.6 mmHg. Aortic valve area, by VTI measures 2.02 cm. Pulmonic Valve: The pulmonic valve was normal in structure. Pulmonic valve regurgitation is not visualized. No evidence of pulmonic stenosis. Aorta: The aortic root is normal in size and structure. Venous: The inferior vena cava is normal in size with greater than 50% respiratory variability, suggesting right atrial pressure of 3 mmHg. IAS/Shunts: No atrial level shunt detected by color flow Doppler.  LEFT VENTRICLE PLAX 2D LVIDd:         4.78 cm  Diastology LVIDs:         3.43 cm  LV e' medial:    7.07 cm/s LV PW:         0.93 cm  LV E/e' medial:  11.8 LV IVS:        0.77 cm  LV e' lateral:   9.36 cm/s LVOT diam:     2.00 cm  LV E/e' lateral: 8.9 LV SV:         39 LV SV Index:   23       2D Longitudinal Strain LVOT Area:     3.14 cm 2D Strain GLS Avg:     -11.3 %  3D Volume EF:                         3D EF:        61 %                         LV EDV:       175 ml                         LV ESV:       68 ml                         LV SV:        107 ml RIGHT VENTRICLE RV Basal diam:  2.92 cm LEFT ATRIUM           Index       RIGHT ATRIUM           Index LA diam:      4.30 cm 2.50 cm/m  RA Area:     12.70 cm LA Vol (A4C): 58.8 ml 34.14 ml/m RA Volume:   23.50 ml  13.65 ml/m  AORTIC VALVE                   PULMONIC VALVE AV Area (Vmax):    1.77 cm    PV Vmax:        0.64 m/s AV Area (Vmean):   1.68 cm    PV Peak grad:   1.6 mmHg AV Area (VTI):     2.02 cm    RVOT Peak grad: 1  mmHg AV Vmax:           118.00 cm/s AV Vmean:          77.250 cm/s AV VTI:            0.192 m AV Peak Grad:      5.6 mmHg AV Mean Grad:      3.0 mmHg LVOT Vmax:         66.60 cm/s LVOT Vmean:        41.300 cm/s LVOT VTI:          0.124 m LVOT/AV VTI ratio: 0.64  AORTA Ao Root diam: 3.10 cm MITRAL VALVE               TRICUSPID VALVE MV Area (PHT): 4.12 cm    TR Peak grad:   24.4 mmHg MV Decel Time: 184 msec    TR Vmax:        247.00 cm/s MV E velocity: 83.60 cm/s MV A velocity: 74.60 cm/s  SHUNTS MV E/A ratio:  1.12        Systemic VTI:  0.12 m                            Systemic Diam: 2.00 cm Ida Rogue MD Electronically signed by Ida Rogue MD Signature Date/Time: 03/02/2021/12:35:38 PM    Final    [place] EKG SITE RITE  Result Date: 03/02/2021 If Site Rite image not attached, placement could not be confirmed due to current cardiac rhythm.    Echo preserved left ventricular function ejection fraction between 50 and 55%  TELEMETRY: Tachycardia  ASSESSMENT AND PLAN:  Principal Problem:   Diverticulitis Active Problems:   Fistula, bladder   Ulcerative colitis ([place])  Hyperlipidemia   Essential hypertension   Hypothyroidism   Abnormal ECG   CKD (chronic kidney disease), stage III (HCC)   Elevated lactic acid level    Plan Postop abdominal surgery now with colostomy as per surgery History of ulcerative colitis Agree with hyperlipidemia therapy Continue blood pressure management and control Consider nephrology input for renal insufficiency Respiratory failure continue supplemental oxygen Non-STEMI by troponins peak of 2000 high-sensitivity Agree with echocardiogram for assessment of ventricular function Continue conservative cardiac input and therapy until patient recovers from surgery    Yolonda Kida, MD 03/03/2021 3:31 PM

## 2021-03-03 NOTE — Consult Note (Signed)
Pharmacy Antibiotic Note  Desiree Madden is a 70 y.o. female admitted on 02/21/2021 with intra-abdominal infection (diverticulitis w/ fecal perforation). Patient septic with acute diverticulitis abdominal abscess with colovesical fistula and enterococcus urinary tract infection which was further complicated by the development of complicated diverticulitis with perforation and gangrenous changes requiring Hartman's procedure performed on 03/01/21. Pt developed severe septic shock after procedure requiring vasopressors. PMH includes CAD s/p CABG (2015), HTN, hypothyroidism, colon bladder fistula, diverticulitis, and ulcerative colitis. Pharmacy was consulted for vancomycin dosing. Since admission her renal function has continued to improve  Plan:  1) adjust vancomycin dose to 750 mg IV every 24 hours  Goal AUC 400-550  Expected AUC: 467.4  SCr used: 1.30  Ke: 0.033 h-1, T1/2: 20.8 h  Daily SCr while on IV vancomycin  Levels as clinically indicated  2) continue cefepime 2 grams IV every 12 hours  Height: $Remove'5\' 4"'FHMMmeo$  (162.6 cm) Weight: 67.2 kg (148 lb 2.4 oz) IBW/kg (Calculated) : 54.7  Temp (24hrs), Avg:98.4 F (36.9 C), Min:97.7 F (36.5 C), Max:98.96 F (37.2 C)  Recent Labs  Lab 03/01/21 0622 03/01/21 2022 03/01/21 2022 03/01/21 2154 03/02/21 0109 03/02/21 0113 03/02/21 0333 03/02/21 0344 03/02/21 0610 03/02/21 1812 03/03/21 0510  WBC 28.2* 27.1*  --   --   --  32.5*  --   --   --  40.6* 36.3*  CREATININE 1.63* 1.44*  --   --   --   --   --  1.58*  --  1.43* 1.30*  LATICACIDVEN  --  7.0*   < > 6.8* 4.0*  --  2.6*  --  1.9 1.6  --    < > = values in this interval not displayed.    Estimated Creatinine Clearance: 38 mL/min (A) (by C-G formula based on SCr of 1.3 mg/dL (H)).    Allergies  Allergen Reactions  . Baclofen     Other reaction(s): Headache  . Sertraline     Other reaction(s): Other (See Comments), Other (See Comments) Muscle aches Muscle aches   . Sulfa  Antibiotics     Other reaction(s): Other (See Comments), Other (See Comments) Pt was told not to take it due to colitis Pt was told not to take it due to colitis   . Ibuprofen Nausea And Vomiting  . Trazodone And Nefazodone     Antimicrobials this admission: ciprofloxacin 3/16 >> 3/19 Zosyn 3/18 >> 3/24 vancomycin  3/24 >>  Cefepime  3/24 >>  metronidazole 3/16>>3/18, 3/24>>  Microbiology results: 3/23 abdominal abscess: mod GPC, GPR, GVR 3/24 BCx: NG x 2 days 3/24 MRSA PCR: negative 3/16 UCx: E.faecium (NTF-Int; Amp & Vanc - Sens) & E. Faecalis (Amp,NTF,Vanc-Sens)  Thank you for allowing pharmacy to be a part of this patient's care.  Dallie Piles, PharmD 03/03/2021 2:34 PM

## 2021-03-03 NOTE — Progress Notes (Signed)
PHARMACY CONSULT NOTE - FOLLOW UP  Pharmacy Consult for Electrolyte Monitoring and Replacement   Recent Labs: Potassium (mmol/L)  Date Value  03/03/2021 3.1 (L)   Magnesium (mg/dL)  Date Value  03/03/2021 2.4   Calcium (mg/dL)  Date Value  03/03/2021 6.7 (L)   Albumin (g/dL)  Date Value  03/02/2021 2.5 (L)   Phosphorus (mg/dL)  Date Value  03/03/2021 4.4   Sodium (mmol/L)  Date Value  03/03/2021 138   Corrected Ca: 8.1  Assessment: Desiree Madden is a 70 y.o. female admitted on 02/21/2021 with intra-abdominal infection (diverticulitis w/ fecal perforation). Patient septic with acute diverticulitis abdominal abscess with colovesical fistula and enterococcus urinary tract infection which was further complicated by the development of complicated diverticulitis with perforation and gangrenous changes requiring Hartman's procedure performed on 03/01/21. Pt developed severe septic shock after procedure requiring vasopressors. Additionally, pt has metabolic acidosis. PMH includes CAD s/p CABG (2015), HTN, hypothyroidism, colon bladder fistula, diverticulitis, and ulcerative colitis. Pharmacy has been consulted for electrolyte monitoring.  Fluids: LR @ 50 mL/hr   Free water 30 ml q4h Continous tube feeds  Goal of Therapy:  Electrolytes WNL  Plan:  3/26 K 3.1  Mag 2.4  Phos 4.4  Scr 1.30   Ca 6.7  Alb 2.5(3/25)   Corr.Ca=7.9  -- NP ordered KCl 10 mEq IV x 4 doses and  KCl 20 mEq Per tube q4h  x 2 doses -- will order Ca Gluc 1 g IV x 1 dose this AM -- Monitor electrolytes with AM labs  Chinita Greenland PharmD Clinical Pharmacist 03/03/2021

## 2021-03-03 NOTE — Consult Note (Addendum)
NAME:  Desiree Madden, MRN:  [id number], DOB:  09-18-1951, LOS: 61 ADMISSION DATE:  02/21/2021, CONSULTATION DATE:  03/01/2021 REFERRING MD:  Dr. Christian Mate, CHIEF COMPLAINT:  Septic shock   Brief Pt Description:  70 y.o. Female admitted 02/21/2021 due to Diverticulitis with Colovesical fistula and UTI. On 03/01/21 she developed complicated Diverticulitis with perforation requiring Sigmoid colectomy with end colostomy and Hartman's procedure.  Post procedure she returns to ICU and remains intubated with Septic Shock requiring multiple vasopressors.  History of Present Illness:  Desiree Madden is a 70 y.o. Female with a past medical history as listed below who presented to [facility] ED on 02/21/2021 with severe lower abdominal pain and dysuria. She denied fever, chills, or vomiting.  Of note she had been on and off antibiotics over the past several months for recurrent UTI's related to her Colovesical fistula. She was supposed to have surgery for the fistula in the fall of 2021, but it never occurred.  ED Course: CT Abdomen & Pelvis showed evidence of possible diverticulitis and constipation, could not rule out colonic neoplasm.  She was admitted to the Med-Surg unit by the Hospitalist for further workup and treatment of Diverticulitis and UTI.  GI and General Surgery were consulted.  Hospital Course: Pt elected to try medical alternatives instead of proceeding with surgery.  However on 02/28/21 she had worsening fever and Leukocytosis. Repeat CT Abdomen/Pelvis showed an abscess with likely fistula formation, of which a Percutaneous Drain was placed by IR. Despite drain placement, on 03/01/21 her leukocytosis continued to worsen.  Decision was made to proceed with surgery.  She was found to have complicated Diverticulitis with perforation and gangrene of the sigmoid colon, she required a sigmoid colectomy with end colostomy and Hartman's procedure.  She returns to ICU post procedure with severe septic shock  and remains intubated.  Pertinent  Medical History  Hypertension Hypothyroidism Ulcerative colitis Diverticulitis Colon-Bladder fistula  Significant Hospital Events: Including procedures, antibiotic start and stop dates in addition to other pertinent events    02/21/2021: Admitted to Med/Surg unit with Diverticulitis and UTI  02/28/2021: LLQ abdominal drain placed by IR to pelvic abscess  03/01/2021: Worsening Leukocytosis despite drain placement. Found to have complicated Diverticulitis with perforation requiring Sigmoid Colectomy with end colostomy and Hartman's Procedure.  Post procedure she returns to ICU and remains intubated with Septic Shock requiring multiple vasopressors.  Left Femoral CVC and Arterial lines placed emergently.    Cultures:  02/21/21: SARS CoV-2 PCR>>negative 02/21/21: Influenza PCR>>negative 02/21/2021: Urine>>ENTEROCOCCUS FAECALIS,  ENTEROCOCCUS FAECIUM(sensitive to vancomycin and ampicillin) 02/22/2021: HIV screen>> nonreactive 02/28/2021: LLQ Abdominal drain>> 03/01/2021: MRSA PCR>> negative 03/01/2021: Blood culture x2>>  Antimicrobials:  Ciprofloxacin 3/16>>3/18 Unasyn 3/23>>3/23 Flagyl 3/16>>3/18; 3/24>> Cefepime 3/24>> Vancomycin 3/24>>  Interim History / Subjective:  -Pt is s/p Sigmoid colectomy with end colostomy and Harman's procedure due to complicated diverticulitis with perforation -Returns to ICU, remains intubated, severe septic shock -Giving Bicarb, starting vasopressors, stress dose steroids -Critically ill  Objective   Blood pressure (!) 102/28, pulse 61, temperature 98.24 F (36.8 C), resp. rate 12, height 5' 4"  (1.626 m), weight 67.2 kg, SpO2 96 %.    Vent Mode: PRVC FiO2 (%):  [50 %] 50 % Set Rate:  [12 bmp] 12 bmp Vt Set:  [450 mL] 450 mL PEEP:  [5 cmH20] 5 cmH20 Plateau Pressure:  [18 cmH20] 18 cmH20   Intake/Output Summary (Last 24 hours) at 03/03/2021 1755 Last data filed at 03/03/2021 1600 Gross per 24 hour  Intake  5123.6 ml  Output 2400 ml  Net 2723.6 ml   Filed Weights   02/27/21 0900 02/28/21 1014 03/01/21 1511  Weight: 66.9 kg 67.2 kg 67.2 kg    Examination: General: Critically ill appearing female, laying in bed, intubated and sedated, in NAD HENT: Atraumatic, normocephalic, neck supple, no JVD Lungs: Mechanical breath sounds, synchronous with the vent, even Cardiovascular: Tachycardia, regular rhythm, s1s2, no M/R/G Abdomen: Soft, distended, tender, Midline abdominal dressing clean dry and intact, end ostomy  Extremities: No deformities, no clubbing, no edema Neuro: Sedated, withdraws from pain, pupils PERRL GU: Foley catheter in place draining yellow urine  Labs/imaging that I havepersonally reviewed   Bicarb 17, Glucose 146, BUN 29, Cr. 1.44, Albumin 1.7, High sensitivity troponin 63, Lactic acid 7.0, WBC 27.1 w/ Neutrophilia, RBC 2, Hgb 6.1, PT 19.6, INR 1.7, APTT 47 03/01/21: CXR>>The heart size and mediastinal contours are within normal limits. ETT is 1.1 cm above the carina. Overlying median sternotomy wires and surgical are present. There is prominence of the central pulmonary vasculature. No acute osseous abnormality. 03/01/21: KUB>>OG tube tip is in the stomach. Nonobstructive bowel gas pattern. No free air.  Resolved Hospital Problem list     Assessment & Plan:   Septic shock +/- Hemorrhagic shock, NSTEMI PMHx of Hypertension -Continuous cardiac monitoring -Maintain MAP >65; pressors nearly off -MaintainIV fluid resuscitation -Transfusions as indicated -Vasopressors as needed to maintain MAP goal (Currently on Levophed and Vasopressin) -IV albumin given -3 amps Bicarb for severe metabolic acidosis given up front -Stress dose steroids -Trend lactic acid -Check troponin -Echocardiogram EF 50-55%, troponin plateau 18k +/-, inferior WMA, awaiting surg clearance for heparin   Severe Sepsis in the setting of Complicated Diverticulitis/Pelvic Abscess with Perforation &  Gangrene of the Sigmoid Colon, Colovesical Fistula Lab Results  Component Value Date   WBC 36.3 (H) 03/03/2021   HGB 9.4 (L) 03/03/2021   HCT 27.2 (L) 03/03/2021   MCV 85.5 03/03/2021   PLT 147 (L) 03/03/2021    -Monitor fever curve -leukocytosis accruing despite coverage, may consider antifungal  -Follow cultures as above -Continue Cefepime, Flagyl, & Vancomycin -General Surgery following, appreciate input -S/p Sigmoid Colectomy with end colostomy, Hartman's Procedure on 03/01/21, along with Cystorrhaphy   Post-Op Respiratory Failure due to Septic Shock -Full vent support -Wean FiO2 & PEEP as tolerated to maintain O2 sats >92% -Follow intermittent CXR and ABG as needed -Implement VAP bundle, 6cc/kg LPVS -Spontaneous breathing trials when respiratory parameters met and mental status permits -Prn Bronchodilators   CKD Stage III b Metabolic Acidosis in setting of Lactic acidosis Lab Results  Component Value Date   CREATININE 1.30 (H) 03/03/2021   BUN 34 (H) 03/03/2021   NA 138 03/03/2021   K 3.3 (L) 03/03/2021   CL 101 03/03/2021   CO2 28 03/03/2021    Intake/Output Summary (Last 24 hours) at 03/03/2021 1755 Last data filed at 03/03/2021 1600 Gross per 24 hour  Intake 5123.6 ml  Output 2400 ml  Net 2723.6 ml   -Monitor I&O's / urinary output -Follow BMP -Ensure adequate renal perfusion -Avoid nephrotoxic agents as able -Replace electrolytes as indicated -Bicarb gtt stopped -Trend lactic acid -creatinine improved   Normocytic Normochromic Anemia (Acute Blood Loss) EBL in surgery 350 ml -Monitor for S/Sx of bleeding -Trend CBC -SCD's for VTE Prophylaxis (can resume chemical prophylaxis when approved by Surgery) -Transfuse for Hgb <8 -Hgb 6.1 post procedure, will give 2 units pRBC's   Hyperglycemia -CBG's -SSI -Follow ICU Hypo/Hyperglycemia protocol  Best practice (evaluated daily)  Diet:  NPO Pain/Anxiety/Delirium protocol (if indicated): Yes  (RASS goal -1) VAP protocol (if indicated): Yes DVT prophylaxis: LMWH GI prophylaxis: PPI Glucose control:  SSI Yes Central venous access:  Yes, and it is still needed Arterial line:  Yes, and it is still needed Foley:  Yes, and it is still needed Mobility:  bed rest  PT consulted: N/A Last date of multidisciplinary goals of care discussion [N/A] Code Status:  full code Disposition: ICU  Labs   CBC: Recent Labs  Lab 03/01/21 0622 03/01/21 2022 03/02/21 0113 03/02/21 1812 03/03/21 0510  WBC 28.2* 27.1* 32.5* 40.6* 36.3*  NEUTROABS  --  24.5* 30.7*  --   --   HGB 11.2* 6.1* 9.8* 9.7* 9.4*  HCT 32.2* 18.7* 29.4* 28.3* 27.2*  MCV 85.4 89.9 89.1 86.3 85.5  PLT 334 237 187 184 147*    Basic Metabolic Panel: Recent Labs  Lab 03/01/21 0622 03/01/21 1411 03/01/21 2022 03/02/21 0113 03/02/21 0344 03/02/21 1812 03/03/21 0200 03/03/21 0510 03/03/21 1644  NA 137  --  139  --  138 139  --  138  --   K 2.7*   < > 3.7  --  3.3* 2.8* 3.0* 3.1* 3.3*  CL 110  --  111  --  105 101  --  101  --   CO2 15*  --  17*  --  19* 23  --  28  --   GLUCOSE 124*  --  146*  --  189* 175*  --  146*  --   BUN 30*  --  29*  --  33* 36*  --  34*  --   CREATININE 1.63*  --  1.44*  --  1.58* 1.43*  --  1.30*  --   CALCIUM 7.5*  --  6.9*  --  6.8* 6.9*  --  6.7*  --   MG 1.9  --   --  1.8  --  1.9  --  2.4 2.2  PHOS 5.2*  --   --  8.1*  --   --   --  4.4  --    < > = values in this interval not displayed.   GFR: Estimated Creatinine Clearance: 38 mL/min (A) (by C-G formula based on SCr of 1.3 mg/dL (H)). Recent Labs  Lab 03/01/21 2022 03/01/21 2154 03/02/21 0109 03/02/21 0113 03/02/21 0333 03/02/21 0610 03/02/21 1812 03/03/21 0510  PROCALCITON 7.56  --   --  20.51  --   --   --  26.93  WBC 27.1*  --   --  32.5*  --   --  40.6* 36.3*  LATICACIDVEN 7.0*   < > 4.0*  --  2.6* 1.9 1.6  --    < > = values in this interval not displayed.    Liver Function Tests: Recent Labs  Lab  03/01/21 2022 03/02/21 0344  AST 34 62*  ALT 12 16  ALKPHOS 55 58  BILITOT 0.7 1.3*  PROT 3.7* 4.6*  ALBUMIN 1.7* 2.5*   No results for input(s): LIPASE, AMYLASE in the last 168 hours. No results for input(s): AMMONIA in the last 168 hours.  ABG    Component Value Date/Time   PHART 7.32 (L) 03/02/2021 0800   PCO2ART 42 03/02/2021 0800   PO2ART 87 03/02/2021 0800   HCO3 21.6 03/02/2021 0800   ACIDBASEDEF 4.3 (H) 03/02/2021 0800   O2SAT 95.8 03/02/2021 0800     Coagulation Profile:  Recent Labs  Lab 03/01/21 2022 03/02/21 0333  INR 1.7* 1.5*    Cardiac Enzymes: No results for input(s): CKTOTAL, CKMB, CKMBINDEX, TROPONINI in the last 168 hours.  HbA1C: No results found for: HGBA1C  CBG: Recent Labs  Lab 03/02/21 2344 03/03/21 0427 03/03/21 0743 03/03/21 1100 03/03/21 1544  GLUCAP 118* 140* 139* 143* 141*    Review of Systems:   Unable to assess due to critical illness, intubation/sedation  Past Medical History:  She,  has a past medical history of Hypertension and Thyroid disease.   Surgical History:   Past Surgical History:  Procedure Laterality Date   CARDIAC SURGERY     COLECTOMY WITH COLOSTOMY CREATION/HARTMANN PROCEDURE Left 03/01/2021   Procedure: COLECTOMY WITH COLOSTOMY CREATION/HARTMANN PROCEDURE;  Surgeon: Ronny Bacon, MD;  Location: [facility] ORS;  Service: General;  Laterality: Left;     Social History:   reports that she has quit smoking. She has never used smokeless tobacco. She reports that she does not drink alcohol and does not use drugs.   Family History:  Her family history includes Arthritis in an other family member; CAD in her mother.   Allergies Allergies  Allergen Reactions   Baclofen     Other reaction(s): Headache   Sertraline     Other reaction(s): Other (See Comments), Other (See Comments) Muscle aches Muscle aches    Sulfa Antibiotics     Other reaction(s): Other (See Comments), Other (See Comments) Pt was  told not to take it due to colitis Pt was told not to take it due to colitis    Ibuprofen Nausea And Vomiting   Trazodone And Nefazodone      Home Medications  Prior to Admission medications   Medication Sig Start Date End Date Taking? Authorizing Provider  alendronate (FOSAMAX) 70 MG tablet Take 70 mg by mouth once a week. Take with a full glass of water on an empty stomach.   Yes [provider]  aspirin EC 81 MG tablet Take 81 mg by mouth daily.   Yes [provider]  atorvastatin (LIPITOR) 20 MG tablet Take 20 mg by mouth daily.   Yes [provider]  diclofenac Sodium (VOLTAREN) 1 % GEL Apply topically 4 (four) times daily.   Yes [provider]  fluticasone (FLONASE) 50 MCG/ACT nasal spray Place 2 sprays into both nostrils daily.   Yes [provider]  gabapentin (NEURONTIN) 300 MG capsule Take 300 mg by mouth 3 (three) times daily.   Yes [provider]  levothyroxine (SYNTHROID) 75 MCG tablet Take 75 mcg by mouth daily before breakfast.   Yes [provider]  meloxicam (MOBIC) 15 MG tablet Take 15 mg by mouth daily.   Yes [provider]  metoprolol succinate (TOPROL-XL) 50 MG 24 hr tablet Take 50 mg by mouth daily. 11/23/20  Yes [provider]  oxyCODONE (OXY IR/ROXICODONE) 5 MG immediate release tablet Take 5 mg by mouth every 12 (twelve) hours as needed.   Yes [provider]  traZODone (DESYREL) 100 MG tablet Take 50-100 mg by mouth at bedtime. Take 1/2 tablet by mouth each evening for sleep, can increase to 117m whole tablet after 1st week.   Yes [provider]     critical care time 477

## 2021-03-04 ENCOUNTER — Inpatient Hospital Stay: Payer: Medicare HMO

## 2021-03-04 ENCOUNTER — Encounter: Payer: Self-pay | Admitting: Internal Medicine

## 2021-03-04 DIAGNOSIS — K5792 Diverticulitis of intestine, part unspecified, without perforation or abscess without bleeding: Secondary | ICD-10-CM | POA: Diagnosis not present

## 2021-03-04 LAB — BASIC METABOLIC PANEL
Anion gap: 9 (ref 5–15)
BUN: 43 mg/dL — ABNORMAL HIGH (ref 8–23)
CO2: 27 mmol/L (ref 22–32)
Calcium: 7.5 mg/dL — ABNORMAL LOW (ref 8.9–10.3)
Chloride: 101 mmol/L (ref 98–111)
Creatinine, Ser: 1.34 mg/dL — ABNORMAL HIGH (ref 0.44–1.00)
GFR, Estimated: 43 mL/min — ABNORMAL LOW (ref >60)
Glucose, Bld: 165 mg/dL — ABNORMAL HIGH (ref 70–99)
Potassium: 3.5 mmol/L (ref 3.5–5.1)
Sodium: 137 mmol/L (ref 135–145)

## 2021-03-04 LAB — PHOSPHORUS: Phosphorus: 3.7 mg/dL (ref 2.5–4.6)

## 2021-03-04 LAB — GLUCOSE, CAPILLARY
Glucose-Capillary: 145 mg/dL — ABNORMAL HIGH (ref 70–99)
Glucose-Capillary: 144 mg/dL — ABNORMAL HIGH (ref 70–99)
Glucose-Capillary: 155 mg/dL — ABNORMAL HIGH (ref 70–99)
Glucose-Capillary: 156 mg/dL — ABNORMAL HIGH (ref 70–99)
Glucose-Capillary: 152 mg/dL — ABNORMAL HIGH (ref 70–99)
Glucose-Capillary: 158 mg/dL — ABNORMAL HIGH (ref 70–99)

## 2021-03-04 LAB — AEROBIC/ANAEROBIC CULTURE W GRAM STAIN (SURGICAL/DEEP WOUND)

## 2021-03-04 LAB — CBC
HCT: 27.4 % — ABNORMAL LOW (ref 36.0–46.0)
Hemoglobin: 9.5 g/dL — ABNORMAL LOW (ref 12.0–15.0)
MCH: 30 pg (ref 26.0–34.0)
MCHC: 34.7 g/dL (ref 30.0–36.0)
MCV: 86.4 fL (ref 80.0–100.0)
Platelets: 142 10*3/uL — ABNORMAL LOW (ref 150–400)
RBC: 3.17 MIL/uL — ABNORMAL LOW (ref 3.87–5.11)
RDW: 15 % (ref 11.5–15.5)
WBC: 23.5 10*3/uL — ABNORMAL HIGH (ref 4.0–10.5)
nRBC: 0 % (ref 0.0–0.2)

## 2021-03-04 LAB — ALBUMIN: Albumin: 1.8 g/dL — ABNORMAL LOW (ref 3.5–5.0)

## 2021-03-04 LAB — MAGNESIUM: Magnesium: 2.3 mg/dL (ref 1.7–2.4)

## 2021-03-04 MED ORDER — VITAL HIGH PROTEIN PO LIQD
1000.0000 mL | ORAL | Status: DC
  Administered 2021-03-04 – 2021-03-05 (×3): 1000 mL

## 2021-03-04 MED ORDER — ALBUMIN HUMAN 25 % IV SOLN
25.0000 g | Freq: Two times a day (BID) | INTRAVENOUS | Status: AC
  Administered 2021-03-04 – 2021-03-07 (×6): 25 g via INTRAVENOUS
  Filled 2021-03-04 (×6): qty 100

## 2021-03-04 MED ORDER — ALBUMIN HUMAN 25 % IV SOLN
25.0000 g | Freq: Once | INTRAVENOUS | Status: AC
  Administered 2021-03-04: 25 g via INTRAVENOUS
  Filled 2021-03-04: qty 100

## 2021-03-04 MED ORDER — FUROSEMIDE 10 MG/ML IJ SOLN
20.0000 mg | Freq: Once | INTRAMUSCULAR | Status: AC
  Administered 2021-03-04: 20 mg via INTRAVENOUS
  Filled 2021-03-04: qty 2

## 2021-03-04 MED ORDER — CHLORHEXIDINE GLUCONATE CLOTH 2 % EX PADS
6.0000 | MEDICATED_PAD | Freq: Every day | CUTANEOUS | Status: DC
  Administered 2021-03-04 – 2021-04-03 (×30): 6 via TOPICAL

## 2021-03-04 MED ORDER — FONDAPARINUX SODIUM 2.5 MG/0.5ML ~~LOC~~ SOLN
2.5000 mg | SUBCUTANEOUS | Status: DC
  Administered 2021-03-04: 2.5 mg via SUBCUTANEOUS
  Filled 2021-03-04 (×2): qty 0.5

## 2021-03-04 MED ORDER — POTASSIUM CHLORIDE 20 MEQ PO PACK
40.0000 meq | PACK | Freq: Once | ORAL | Status: AC
  Administered 2021-03-04: 40 meq
  Filled 2021-03-04: qty 2

## 2021-03-04 NOTE — Progress Notes (Signed)
Patient continued on mechanical ventilation this shift. During awakening trial this morning patient became restless and agitated and so sedation restarted. Patient with lots of drainage from JP site, JP drain, abdominal incision, and from colostomy. Dr. Dahlia Byes notified of very large blood clots coming from colostomy in the afternoon and he came to bedside to personally evaluate them and the patient.

## 2021-03-04 NOTE — Consult Note (Signed)
Pharmacy Antibiotic Note  Desiree Madden is a 70 y.o. female admitted on 02/21/2021 with intra-abdominal infection (diverticulitis w/ fecal perforation). Patient septic with acute diverticulitis abdominal abscess with colovesical fistula and enterococcus urinary tract infection which was further complicated by the development of complicated diverticulitis with perforation and gangrenous changes requiring Hartman's procedure performed on 03/01/21. Pt developed severe septic shock after procedure requiring vasopressors. PMH includes CAD s/p CABG (2015), HTN, hypothyroidism, colon bladder fistula, diverticulitis, and ulcerative colitis. Pharmacy was consulted for vancomycin dosing. Since admission her renal function has continued to improve  Plan:  1) adjust vancomycin dose to 750 mg IV every 24 hours on 3/26  Goal AUC 400-550  Expected AUC: 467.4  SCr used: 1.30  Ke: 0.033 h-1, T1/2: 20.8 h  Daily SCr while on IV vancomycin  Levels as clinically indicated  2) continue cefepime 2 grams IV every 12 hours  Height: $Remove'5\' 4"'[id number]$  (162.6 cm) Weight: 67.2 kg (148 lb 2.4 oz) IBW/kg (Calculated) : 54.7  Temp (24hrs), Avg:98 F (36.7 C), Min:96.44 F (35.8 C), Max:98.78 F (37.1 C)  Recent Labs  Lab 03/01/21 2022 03/01/21 2154 03/02/21 0109 03/02/21 0113 03/02/21 0333 03/02/21 0344 03/02/21 0610 03/02/21 1812 03/03/21 0510 03/04/21 0454  WBC 27.1*  --   --  32.5*  --   --   --  40.6* 36.3* 23.5*  CREATININE 1.44*  --   --   --   --  1.58*  --  1.43* 1.30* 1.34*  LATICACIDVEN 7.0* 6.8* 4.0*  --  2.6*  --  1.9 1.6  --   --     Estimated Creatinine Clearance: 36.8 mL/min (A) (by C-G formula based on SCr of 1.34 mg/dL (H)).    Allergies  Allergen Reactions  . Baclofen     Other reaction(s): Headache  . Sertraline     Other reaction(s): Other (See Comments), Other (See Comments) Muscle aches Muscle aches   . Sulfa Antibiotics     Other reaction(s): Other (See Comments), Other (See  Comments) Pt was told not to take it due to colitis Pt was told not to take it due to colitis   . Ibuprofen Nausea And Vomiting  . Trazodone And Nefazodone     Antimicrobials this admission: ciprofloxacin 3/16 >> 3/19 Zosyn 3/18 >> 3/24 vancomycin  3/24 >>  Cefepime  3/24 >>  metronidazole 3/16>>3/18, 3/24>>  Microbiology results: 3/23 abdominal abscess: mod GPC, GPR, GVR 3/24 BCx: NG x 2 days 3/24 MRSA PCR: negative 3/16 UCx: E.faecium (NTF-Int; Amp & Vanc - Sens) & E. Faecalis (Amp,NTF,Vanc-Sens)  Thank you for allowing pharmacy to be a part of this patient's care.  Merrill,Kristin A, PharmD 03/04/2021 9:55 AM

## 2021-03-04 NOTE — Progress Notes (Signed)
NAME:  Desiree Madden, MRN:  [id number], DOB:  07/04/1951, LOS: 29 ADMISSION DATE:  02/21/2021, INITIAL CONSULTATION DATE:  03/01/2021 REFERRING MD:  Dr. Christian Mate, CHIEF COMPLAINT:  Septic shock   Brief Pt Description:  70 y.o. Female admitted 02/21/2021 due to Diverticulitis with Colovesical fistula and UTI. On 03/01/21 she developed complicated Diverticulitis with perforation requiring Sigmoid colectomy with end colostomy and Hartman's procedure.  Post procedure she returns to ICU and remains intubated with Septic Shock requiring multiple vasopressors.  History of Present Illness:  Desiree Madden is a 70 y.o. Female with a past medical history as listed below who presented to [facility] ED on 02/21/2021 with severe lower abdominal pain and dysuria. She denied fever, chills, or vomiting.  Of note she had been on and off antibiotics over the past several months for recurrent UTI's related to her Colovesical fistula. She was supposed to have surgery for the fistula in the fall of 2021, but it never occurred.  ED Course: CT Abdomen & Pelvis showed evidence of possible diverticulitis and constipation, could not rule out colonic neoplasm.  She was admitted to the Med-Surg unit by the Hospitalist for further workup and treatment of Diverticulitis and UTI.  GI and General Surgery were consulted.  Hospital Course: Pt elected to try medical alternatives instead of proceeding with surgery.  However on 02/28/21 she had worsening fever and Leukocytosis. Repeat CT Abdomen/Pelvis showed an abscess with likely fistula formation, of which a Percutaneous Drain was placed by IR. Despite drain placement, on 03/01/21 her leukocytosis continued to worsen.  Decision was made to proceed with surgery.  She was found to have complicated Diverticulitis with perforation and gangrene of the sigmoid colon, she required a sigmoid colectomy with end colostomy and Hartman's procedure.  She returns to ICU post procedure with severe  septic shock and remains intubated.  Pertinent  Medical History  Hypertension Hypothyroidism Ulcerative colitis Diverticulitis Colon-Bladder fistula  Significant Hospital Events: Including procedures, antibiotic start and stop dates in addition to other pertinent events   . 02/21/2021: Admitted to Med/Surg unit with Diverticulitis and UTI . 02/28/2021: LLQ abdominal drain placed by IR to pelvic abscess . 03/01/2021: Worsening Leukocytosis despite drain placement. Found to have complicated Diverticulitis with perforation requiring Sigmoid Colectomy with end colostomy and Hartman's Procedure.  Post procedure she returns to ICU and remains intubated with Septic Shock requiring multiple vasopressors.  Left Femoral CVC and Arterial lines placed emergently . 03/04/2021: Severe anasarca, albumin 1.8    Cultures:  02/21/21: SARS CoV-2 PCR>>negative 02/21/21: Influenza PCR>>negative 02/21/2021: Urine>>ENTEROCOCCUS FAECALIS,  ENTEROCOCCUS FAECIUM(sensitive to vancomycin and ampicillin) 02/22/2021: HIV screen>> nonreactive 02/28/2021: LLQ Abdominal drain>> Enterococcus avium, other polymicrobial  03/01/2021: MRSA PCR>> negative 03/01/2021: Blood culture x2>> with  Antimicrobials:  Ciprofloxacin 3/16>>3/18 Unasyn 3/23>>3/23 Flagyl 3/16>>3/18; 3/24>> Cefepime 3/24>> Vancomycin 3/24>>  Interim History / Subjective:  -Intubated, mechanically ventilated -Critically ill, severe protein calorie malnutrition -Anasarca  Objective   Blood pressure (!) 102/28, pulse 64, temperature (!) 97.16 F (36.2 C), temperature source Esophageal, resp. rate 11, height 5' 4"  (1.626 m), weight 67.2 kg, SpO2 96 %.    Vent Mode: PRVC FiO2 (%):  [35 %-50 %] 35 % Set Rate:  [12 bmp-16 bmp] 16 bmp Vt Set:  [450 mL] 450 mL PEEP:  [5 cmH20] 5 cmH20 Plateau Pressure:  [14 cmH20-18 cmH20] 14 cmH20   Intake/Output Summary (Last 24 hours) at 03/04/2021 1002 Last data filed at 03/04/2021 0900 Gross per 24 hour  Intake  3033.56 ml  Output  2125 ml  Net 908.56 ml   Filed Weights   02/27/21 0900 02/28/21 1014 03/01/21 1511  Weight: 66.9 kg 67.2 kg 67.2 kg    Examination: General: Critically ill appearing female, laying in bed, intubated and sedated, synchronous with the ventilator HENT: Atraumatic, normocephalic, neck supple, no JVD, scleral edema noted Lungs: Mechanical breath sounds, synchronous with the vent, even Cardiovascular: Tachycardia, regular rhythm, s1s2, no M/R/G Abdomen: Soft, distended, tender, Midline abdominal dressing clean dry and intact, end ostomy  Extremities: No deformities, no clubbing, anasarca 3+  Neuro: Sedated, withdraws from pain, pupils PERRL GU: Foley catheter in place draining yellow urine  Labs/imaging that I havepersonally reviewed   Bicarb 17, Glucose 146, BUN 29, Cr. 1.44, Albumin 1.7, High sensitivity troponin 63, Lactic acid 7.0, WBC 27.1 w/ Neutrophilia, RBC 2, Hgb 6.1, PT 19.6, INR 1.7, APTT 47 03/01/21: CXR>>The heart size and mediastinal contours are within normal limits. ETT is 1.1 cm above the carina. Overlying median sternotomy wires and surgical are present. There is prominence of the central pulmonary vasculature. No acute osseous abnormality. 03/01/21: KUB>>OG tube tip is in the stomach. Nonobstructive bowel gas pattern. No free air. 03/04/2021: Albumin 2.8, other labs and micro data reviewed, chest x-ray as below:    Resolved Hospital Problem list   N/A  Assessment & Plan:   Septic shock +/- Hemorrhagic shock, NSTEMI PMHx of Hypertension -Continuous cardiac monitoring -Maintain MAP >65; pressors nearly off -MaintainIV fluid resuscitation -Transfusions as indicated -Vasopressors as needed to maintain MAP goal (Currently on Levophed and Vasopressin) -IV albumin given -3 amps Bicarb for severe metabolic acidosis given up front -Stress dose steroids -Trend lactic acid -Check troponin -Echocardiogram EF 50-55%, troponin plateau 18k +/-, inferior  WMA, awaiting surg clearance for heparin   Severe Sepsis in the setting of Complicated Diverticulitis/Pelvic Abscess with Perforation & Gangrene of the Sigmoid Colon, Colovesical Fistula Lab Results  Component Value Date   WBC 23.5 (H) 03/04/2021   HGB 9.5 (L) 03/04/2021   HCT 27.4 (L) 03/04/2021   MCV 86.4 03/04/2021   PLT 142 (L) 03/04/2021    -Monitor fever curve -leukocytosis decreasing coverage,consider antifungal if on upward trend again -Follow cultures as above -Only few Candida noted on abscess mostly Enterococcus species -Continue Cefepime, Flagyl, & Vancomycin -General Surgery following, appreciate input, discussed with Dr. Dahlia Byes -S/p Sigmoid Colectomy with end colostomy, Hartman's Procedure on 03/01/21, along with Cystorrhaphy   Post-Op Respiratory Failure due to Septic Shock Anasarca with volume overload -Full vent support -Wean FiO2 & PEEP as tolerated to maintain O2 sats >92% -Follow intermittent CXR and ABG as needed -Implement VAP bundle, 6cc/kg LPVS -Spontaneous breathing trials when respiratory parameters met and mental status permits -Prn Bronchodilators -Albumin/Lasix to help mobilize fluid   CKD Stage III b Metabolic Acidosis in setting of Lactic acidosis Lab Results  Component Value Date   CREATININE 1.34 (H) 03/04/2021   BUN 43 (H) 03/04/2021   NA 137 03/04/2021   K 3.5 03/04/2021   CL 101 03/04/2021   CO2 27 03/04/2021    Intake/Output Summary (Last 24 hours) at 03/04/2021 1002 Last data filed at 03/04/2021 0900 Gross per 24 hour  Intake 3033.56 ml  Output 2125 ml  Net 908.56 ml   -Monitor I&O's / urinary output -Follow BMP -Ensure adequate renal perfusion -Avoid nephrotoxic agents as able -Replace electrolytes as indicated -Bicarb gtt stopped -Trend lactic acid -creatinine improved   Normocytic Normochromic Anemia (Acute Blood Loss) EBL in surgery 350 ml -Monitor for S/Sx of  bleeding -Trend CBC -SCD's for VTE Prophylaxis (can  resume chemical prophylaxis when approved by Surgery) -Transfuse for Hgb <8 -Hgb 6.1 post procedure, will give 2 units pRBC's   Hyperglycemia -CBG's -SSI -Follow ICU Hypo/Hyperglycemia protocol     Best practice (evaluated daily)  Diet:  NPO Pain/Anxiety/Delirium protocol (if indicated): Yes (RASS goal -1) VAP protocol (if indicated): Yes DVT prophylaxis: LMWH GI prophylaxis: PPI Glucose control:  SSI Yes Central venous access:  Yes, and it is still needed Arterial line:  Yes, and it is still needed Foley:  Yes, and it is still needed Mobility:  bed rest  PT consulted: N/A Last date of multidisciplinary goals of care discussion [N/A] Code Status:  full code Disposition: ICU  Labs   CBC: Recent Labs  Lab 03/01/21 2022 03/02/21 0113 03/02/21 1812 03/03/21 0510 03/04/21 0454  WBC 27.1* 32.5* 40.6* 36.3* 23.5*  NEUTROABS 24.5* 30.7*  --   --   --   HGB 6.1* 9.8* 9.7* 9.4* 9.5*  HCT 18.7* 29.4* 28.3* 27.2* 27.4*  MCV 89.9 89.1 86.3 85.5 86.4  PLT 237 187 184 147* 142*    Basic Metabolic Panel: Recent Labs  Lab 03/01/21 0622 03/01/21 1411 03/01/21 2022 03/02/21 0113 03/02/21 0344 03/02/21 1812 03/03/21 0200 03/03/21 0510 03/03/21 1644 03/04/21 0454  NA 137  --  139  --  138 139  --  138  --  137  K 2.7*   < > 3.7  --  3.3* 2.8* 3.0* 3.1* 3.3* 3.5  CL 110  --  111  --  105 101  --  101  --  101  CO2 15*  --  17*  --  19* 23  --  28  --  27  GLUCOSE 124*  --  146*  --  189* 175*  --  146*  --  165*  BUN 30*  --  29*  --  33* 36*  --  34*  --  43*  CREATININE 1.63*  --  1.44*  --  1.58* 1.43*  --  1.30*  --  1.34*  CALCIUM 7.5*  --  6.9*  --  6.8* 6.9*  --  6.7*  --  7.5*  MG 1.9  --   --  1.8  --  1.9  --  2.4 2.2 2.3  PHOS 5.2*  --   --  8.1*  --   --   --  4.4  --  3.7   < > = values in this interval not displayed.   GFR: Estimated Creatinine Clearance: 36.8 mL/min (A) (by C-G formula based on SCr of 1.34 mg/dL (H)). Recent Labs  Lab 03/01/21 2022  03/01/21 2154 03/02/21 0109 03/02/21 0113 03/02/21 0333 03/02/21 0610 03/02/21 1812 03/03/21 0510 03/04/21 0454  PROCALCITON 7.56  --   --  20.51  --   --   --  26.93  --   WBC 27.1*  --   --  32.5*  --   --  40.6* 36.3* 23.5*  LATICACIDVEN 7.0*   < > 4.0*  --  2.6* 1.9 1.6  --   --    < > = values in this interval not displayed.    Liver Function Tests: Recent Labs  Lab 03/01/21 2022 03/02/21 0344  AST 34 62*  ALT 12 16  ALKPHOS 55 58  BILITOT 0.7 1.3*  PROT 3.7* 4.6*  ALBUMIN 1.7* 2.5*   No results for input(s): LIPASE, AMYLASE in the last 168 hours.  No results for input(s): AMMONIA in the last 168 hours.  ABG    Component Value Date/Time   PHART 7.32 (L) 03/02/2021 0800   PCO2ART 42 03/02/2021 0800   PO2ART 87 03/02/2021 0800   HCO3 21.6 03/02/2021 0800   ACIDBASEDEF 4.3 (H) 03/02/2021 0800   O2SAT 95.8 03/02/2021 0800     Coagulation Profile: Recent Labs  Lab 03/01/21 2022 03/02/21 0333  INR 1.7* 1.5*    Cardiac Enzymes: No results for input(s): CKTOTAL, CKMB, CKMBINDEX, TROPONINI in the last 168 hours.  HbA1C: No results found for: HGBA1C  CBG: Recent Labs  Lab 03/03/21 1544 03/03/21 2017 03/03/21 2318 03/04/21 0428 03/04/21 0732  GLUCAP 141* 182* 155* 145* 144*    Review of Systems:   Unable to assess due to critical illness, intubation/sedation   Allergies Allergies  Allergen Reactions  . Baclofen     Other reaction(s): Headache  . Sertraline     Other reaction(s): Other (See Comments), Other (See Comments) Muscle aches Muscle aches   . Sulfa Antibiotics     Other reaction(s): Other (See Comments), Other (See Comments) Pt was told not to take it due to colitis Pt was told not to take it due to colitis   . Ibuprofen Nausea And Vomiting  . Trazodone And Nefazodone      Scheduled Meds: . atorvastatin  20 mg Per Tube Daily  . chlorhexidine gluconate (MEDLINE KIT)  15 mL Mouth Rinse BID  . Chlorhexidine Gluconate Cloth  6  each Topical Daily  . docusate  100 mg Per Tube BID  . fondaparinux (ARIXTRA) injection  2.5 mg Subcutaneous Q24H  . free water  30 mL Per Tube Q4H  . hydrocortisone sod succinate (SOLU-CORTEF) inj  50 mg Intravenous Q6H  . insulin aspart  0-15 Units Subcutaneous Q4H  . lactulose  20 g Oral Once  . levothyroxine  75 mcg Per Tube Q0600  . mouth rinse  15 mL Mouth Rinse 10 times per day  . multivitamin with minerals  1 tablet Per Tube Daily  . pantoprazole (PROTONIX) IV  40 mg Intravenous Daily  . polyethylene glycol  17 g Per Tube Daily  . sodium chloride flush  10-40 mL Intracatheter Q12H  . sodium chloride flush  5 mL Intracatheter Q8H   Continuous Infusions: . sodium chloride Stopped (03/02/21 1553)  . albumin human    . ceFEPime (MAXIPIME) IV Stopped (03/04/21 1151)  . feeding supplement (VITAL HIGH PROTEIN) 1,000 mL (03/04/21 1128)  . fentaNYL infusion INTRAVENOUS 125 mcg/hr (03/04/21 1800)  . lactated ringers 50 mL/hr at 03/04/21 1800  . metronidazole Stopped (03/04/21 1309)  . phenylephrine (NEO-SYNEPHRINE) Adult infusion 40 mcg/min (03/04/21 1800)  . propofol (DIPRIVAN) infusion 15 mcg/kg/min (03/04/21 1800)  . vancomycin 150 mL/hr at 03/04/21 1800  . vasopressin Stopped (03/04/21 0433)   PRN Meds:.acetaminophen **OR** acetaminophen, fentaNYL (SUBLIMAZE) injection, fentaNYL (SUBLIMAZE) injection, HYDROcodone-acetaminophen, HYDROmorphone (DILAUDID) injection, ipratropium-albuterol, ondansetron **OR** ondansetron (ZOFRAN) IV, sodium chloride flush  The patient is critically ill with multiple organ systems failure and requires high complexity decision making for assessment and support, frequent evaluation and titration of therapies, application of advanced monitoring technologies and extensive interpretation of multiple databases. Critical Care Time devoted to patient care services described in this note is 45 minutes.  Renold Don, MD [facility]   *This note was  dictated using voice recognition software/Dragon.  Despite best efforts to proofread, errors can occur which can change the meaning.  Any change was purely unintentional.

## 2021-03-04 NOTE — Progress Notes (Signed)
[facility] Cardiology    SUBJECTIVE: Intubated sedated   Vitals:   03/04/21 0645 03/04/21 0700 03/04/21 0800 03/04/21 0900  BP:      Pulse: (!) 54 (!) 56 61 64  Resp: $Remo'11 11 12 11  'GGzio$ Temp: (!) 96.98 F (36.1 C) (!) 96.44 F (35.8 C) (!) 97.16 F (36.2 C) (!) 97.16 F (36.2 C)  TempSrc:  Esophageal Esophageal Esophageal  SpO2: 96% 96% 97% 96%  Weight:      Height:         Intake/Output Summary (Last 24 hours) at 03/04/2021 0948 Last data filed at 03/04/2021 0900 Gross per 24 hour  Intake 3033.56 ml  Output 2125 ml  Net 908.56 ml      PHYSICAL EXAM  General: Well developed, well nourished, in no acute distress HEENT:  Normocephalic and atramatic Neck:  No JVD.  Lungs: Clear bilaterally to auscultation and percussion. Heart: HRRR . Normal S1 and S2 without gallops or murmurs.  Abdomen: Bowel sounds are positive, abdomen soft and non-tender  Msk:  Back normal, normal gait. Normal strength and tone for age. Extremities: No clubbing, cyanosis or edema.   Neuro: Alert and oriented X 3 intubated sedated but responsive to commands. Psych:  Good affect, responds appropriately   LABS: Basic Metabolic Panel: Recent Labs    03/03/21 0510 03/03/21 1644 03/04/21 0454  NA 138  --  137  K 3.1* 3.3* 3.5  CL 101  --  101  CO2 28  --  27  GLUCOSE 146*  --  165*  BUN 34*  --  43*  CREATININE 1.30*  --  1.34*  CALCIUM 6.7*  --  7.5*  MG 2.4 2.2 2.3  PHOS 4.4  --  3.7   Liver Function Tests: Recent Labs    03/01/21 2022 03/02/21 0344  AST 34 62*  ALT 12 16  ALKPHOS 55 58  BILITOT 0.7 1.3*  PROT 3.7* 4.6*  ALBUMIN 1.7* 2.5*   No results for input(s): LIPASE, AMYLASE in the last 72 hours. CBC: Recent Labs    03/01/21 2022 03/02/21 0113 03/02/21 1812 03/03/21 0510 03/04/21 0454  WBC 27.1* 32.5*   < > 36.3* 23.5*  NEUTROABS 24.5* 30.7*  --   --   --   HGB 6.1* 9.8*   < > 9.4* 9.5*  HCT 18.7* 29.4*   < > 27.2* 27.4*  MCV 89.9 89.1   < > 85.5 86.4  PLT 237 187   < >  147* 142*   < > = values in this interval not displayed.   Cardiac Enzymes: No results for input(s): CKTOTAL, CKMB, CKMBINDEX, TROPONINI in the last 72 hours. BNP: Invalid input(s): POCBNP D-Dimer: No results for input(s): DDIMER in the last 72 hours. Hemoglobin A1C: No results for input(s): HGBA1C in the last 72 hours. Fasting Lipid Panel: Recent Labs    03/02/21 0333  TRIG 148   Thyroid Function Tests: No results for input(s): TSH, T4TOTAL, T3FREE, THYROIDAB in the last 72 hours.  Invalid input(s): FREET3 Anemia Panel: No results for input(s): VITAMINB12, FOLATE, FERRITIN, TIBC, IRON, RETICCTPCT in the last 72 hours.  DG Chest Port 1 View  Result Date: 03/04/2021 CLINICAL DATA:  Check endotracheal tube placement EXAM: PORTABLE CHEST 1 VIEW COMPARISON:  Film from the previous day. FINDINGS: Cardiac shadow is stable. Postsurgical changes are again seen. Right-sided PICC line is noted in satisfactory position. Endotracheal tube and gastric catheter are seen in satisfactory position. Mild left basilar atelectasis and small effusion  is seen. No other focal abnormality is noted. IMPRESSION: Tubes and lines as described in satisfactory position. Mild left basilar atelectasis with associated small effusion. Electronically Signed   By: Inez Catalina M.D.   On: 03/04/2021 03:44   DG Chest Port 1 View  Result Date: 03/03/2021 CLINICAL DATA:  Hypoxia EXAM: PORTABLE CHEST 1 VIEW COMPARISON:  03/02/2021 FINDINGS: Endotracheal tube terminates 5.3 cm above the carina. Mild patchy bilateral lower lobe opacities, likely atelectasis, with possible trace left pleural effusion. No frank interstitial edema. No pneumothorax. The heart is normal in size. Postsurgical changes related to prior CABG. Right subclavian venous catheter terminates at the cavoatrial junction. Enteric tube terminates in the proximal stomach. Median sternotomy. IMPRESSION: Mild patchy bilateral lower lobe opacities, likely atelectasis,  with possible trace left pleural effusion. No frank interstitial edema. Endotracheal tube terminates 5.3 cm above the carina. Additional support apparatus as above. Electronically Signed   By: Julian Hy M.D.   On: 03/03/2021 05:57   ECHOCARDIOGRAM COMPLETE  Result Date: 03/02/2021    ECHOCARDIOGRAM REPORT   Patient Name:   Desiree Madden Date of Exam: 03/02/2021 Medical Rec #:  [id number]      Height:       64.0 in Accession #:    [id number]     Weight:       148.1 lb Date of Birth:  01-20-1951     BSA:          1.722 m Patient Age:    70 years       BP:           Not listed in chart/Not listed in                                              chart mmHg Patient Gender: F              HR:           85 bpm. Exam Location:  [facility] Procedure: 2D Echo, Cardiac Doppler, Color Doppler and Strain Analysis Indications:     CHF-acute systolic S60.15  History:         Patient has no prior history of Echocardiogram examinations.                  Risk Factors:Hypertension.  Sonographer:     Sherrie Sport RDCS (AE) Referring Phys:  [id number] Nelle Don Diagnosing Phys: Ida Rogue MD  Sonographer Comments: Global longitudinal strain was attempted. IMPRESSIONS  1. Left ventricular ejection fraction, by estimation, is 50 to 55%. The left ventricle has low normal function. The left ventricle demonstrates regional wall motion abnormalities (Concern for basal to mid inferior wall hypokinesis). Left ventricular diastolic parameters are indeterminate. The average left ventricular global longitudinal strain is -11.3 %. The global longitudinal strain is abnormal.  2. Right ventricular systolic function is normal. The right ventricular size is normal.  3. Left atrial size was mildly dilated.  4. The mitral valve is normal in structure. Moderate mitral valve regurgitation.  5. Left pleural effusion noted, 7 cm FINDINGS  Left Ventricle: Left ventricular ejection fraction, by estimation, is 50 to 55%. The left ventricle has low  normal function. The left ventricle demonstrates regional wall motion abnormalities. The average left ventricular global longitudinal strain is -11.3 %. The global longitudinal strain is abnormal. The left ventricular internal cavity size was  normal in size. There is no left ventricular hypertrophy. Left ventricular diastolic parameters are indeterminate. Right Ventricle: The right ventricular size is normal. No increase in right ventricular wall thickness. Right ventricular systolic function is normal. Left Atrium: Left atrial size was mildly dilated. Right Atrium: Right atrial size was normal in size. Pericardium: There is no evidence of pericardial effusion. Mitral Valve: The mitral valve is normal in structure. Moderate mitral valve regurgitation. No evidence of mitral valve stenosis. Tricuspid Valve: The tricuspid valve is normal in structure. Tricuspid valve regurgitation is mild . No evidence of tricuspid stenosis. Aortic Valve: The aortic valve is normal in structure. Aortic valve regurgitation is not visualized. Mild to moderate aortic valve sclerosis/calcification is present, without any evidence of aortic stenosis. Aortic valve mean gradient measures 3.0 mmHg. Aortic valve peak gradient measures 5.6 mmHg. Aortic valve area, by VTI measures 2.02 cm. Pulmonic Valve: The pulmonic valve was normal in structure. Pulmonic valve regurgitation is not visualized. No evidence of pulmonic stenosis. Aorta: The aortic root is normal in size and structure. Venous: The inferior vena cava is normal in size with greater than 50% respiratory variability, suggesting right atrial pressure of 3 mmHg. IAS/Shunts: No atrial level shunt detected by color flow Doppler.  LEFT VENTRICLE PLAX 2D LVIDd:         4.78 cm  Diastology LVIDs:         3.43 cm  LV e' medial:    7.07 cm/s LV PW:         0.93 cm  LV E/e' medial:  11.8 LV IVS:        0.77 cm  LV e' lateral:   9.36 cm/s LVOT diam:     2.00 cm  LV E/e' lateral: 8.9 LV SV:          39 LV SV Index:   23       2D Longitudinal Strain LVOT Area:     3.14 cm 2D Strain GLS Avg:     -11.3 %                          3D Volume EF:                         3D EF:        61 %                         LV EDV:       175 ml                         LV ESV:       68 ml                         LV SV:        107 ml RIGHT VENTRICLE RV Basal diam:  2.92 cm LEFT ATRIUM           Index       RIGHT ATRIUM           Index LA diam:      4.30 cm 2.50 cm/m  RA Area:     12.70 cm LA Vol (A4C): 58.8 ml 34.14 ml/m RA Volume:   23.50 ml  13.65 ml/m  AORTIC VALVE  PULMONIC VALVE AV Area (Vmax):    1.77 cm    PV Vmax:        0.64 m/s AV Area (Vmean):   1.68 cm    PV Peak grad:   1.6 mmHg AV Area (VTI):     2.02 cm    RVOT Peak grad: 1 mmHg AV Vmax:           118.00 cm/s AV Vmean:          77.250 cm/s AV VTI:            0.192 m AV Peak Grad:      5.6 mmHg AV Mean Grad:      3.0 mmHg LVOT Vmax:         66.60 cm/s LVOT Vmean:        41.300 cm/s LVOT VTI:          0.124 m LVOT/AV VTI ratio: 0.64  AORTA Ao Root diam: 3.10 cm MITRAL VALVE               TRICUSPID VALVE MV Area (PHT): 4.12 cm    TR Peak grad:   24.4 mmHg MV Decel Time: 184 msec    TR Vmax:        247.00 cm/s MV E velocity: 83.60 cm/s MV A velocity: 74.60 cm/s  SHUNTS MV E/A ratio:  1.12        Systemic VTI:  0.12 m                            Systemic Diam: 2.00 cm Ida Rogue MD Electronically signed by Ida Rogue MD Signature Date/Time: 03/02/2021/12:35:38 PM    Final    [place] EKG SITE RITE  Result Date: 03/02/2021 If Site Rite image not attached, placement could not be confirmed due to current cardiac rhythm.    Echo preserved left ventricular function ejection fraction around 60%  TELEMETRY: Atrial fibrillation rate of around 90 nonspecific ST-T changes:  ASSESSMENT AND PLAN:  Principal Problem:   Diverticulitis Active Problems:   Fistula, bladder   Ulcerative colitis ([place])   Hyperlipidemia   Essential  hypertension   Hypothyroidism   Abnormal ECG   CKD (chronic kidney disease), stage III (HCC)   Elevated lactic acid level Non-STEMI  Plan Intubated sedated continue ventilatory support Postop, surgery continue postop surgical care Continue hypertension management control Consider nephrology input for renal insufficiency Atrial fibrillation recently rate control of around 90 continue current management Recommend anticoagulation with heparin if okay with surgery Do not recommend any cardiac intervention at this point Correct electrolytes   Yolonda Kida, MD 03/04/2021 9:48 AM

## 2021-03-04 NOTE — Progress Notes (Signed)
CC: perf diverticultiis  Subjective: S/p hartmann's perforated diverticulitis. sHe is now weaning off vasopressor  White count continues to improve now 23.  Her creatinine stable. Has anasarca and serous output from drain  Objective: Vital signs in last 24 hours: Temp:  [96.44 F (35.8 C)-98.78 F (37.1 C)] 96.98 F (36.1 C) (03/27 1400) Pulse Rate:  [52-102] 58 (03/27 1400) Resp:  [9-20] 12 (03/27 1400) SpO2:  [93 %-99 %] 95 % (03/27 1400) Arterial Line BP: (83-121)/(41-63) 113/54 (03/27 1400) FiO2 (%):  [35 %-50 %] 35 % (03/27 1108) Last BM Date: 02/28/21  Intake/Output from previous day: 03/26 0701 - 03/27 0700 In: 2862.8 [I.V.:1525.4; NG/GT:437.5; IV Piggyback:899.9] Out: 2070 [Urine:1025; Drains:845; Stool:200] Intake/Output this shift: Total I/O In: 1068 [I.V.:474.2; NG/GT:329.1; IV Piggyback:264.7] Out: 0233 [Urine:710; Drains:210; Stool:275]  Physical exam: Sedated on the vent Abd: soft, midline incision w staples, no infection wicks in place. End colostomy with distension, colotomy performed at bedside with scissors, liquid stool was explosive. Stoma appliance changed. Drain w serous output  Lab Results: CBC  Recent Labs    03/03/21 0510 03/04/21 0454  WBC 36.3* 23.5*  HGB 9.4* 9.5*  HCT 27.2* 27.4*  PLT 147* 142*   BMET Recent Labs    03/03/21 0510 03/03/21 1644 03/04/21 0454  NA 138  --  137  K 3.1* 3.3* 3.5  CL 101  --  101  CO2 28  --  27  GLUCOSE 146*  --  165*  BUN 34*  --  43*  CREATININE 1.30*  --  1.34*  CALCIUM 6.7*  --  7.5*   PT/INR Recent Labs    03/01/21 2022 03/02/21 0333  LABPROT 19.6* 17.2*  INR 1.7* 1.5*   ABG Recent Labs    03/02/21 0048 03/02/21 0800  PHART 7.22* 7.32*  HCO3 14.8* 21.6    Studies/Results: DG Chest Port 1 View  Result Date: 03/04/2021 CLINICAL DATA:  Check endotracheal tube placement EXAM: PORTABLE CHEST 1 VIEW COMPARISON:  Film from the previous day. FINDINGS: Cardiac shadow is stable.  Postsurgical changes are again seen. Right-sided PICC line is noted in satisfactory position. Endotracheal tube and gastric catheter are seen in satisfactory position. Mild left basilar atelectasis and small effusion is seen. No other focal abnormality is noted. IMPRESSION: Tubes and lines as described in satisfactory position. Mild left basilar atelectasis with associated small effusion. Electronically Signed   By: Inez Catalina M.D.   On: 03/04/2021 03:44   DG Chest Port 1 View  Result Date: 03/03/2021 CLINICAL DATA:  Hypoxia EXAM: PORTABLE CHEST 1 VIEW COMPARISON:  03/02/2021 FINDINGS: Endotracheal tube terminates 5.3 cm above the carina. Mild patchy bilateral lower lobe opacities, likely atelectasis, with possible trace left pleural effusion. No frank interstitial edema. No pneumothorax. The heart is normal in size. Postsurgical changes related to prior CABG. Right subclavian venous catheter terminates at the cavoatrial junction. Enteric tube terminates in the proximal stomach. Median sternotomy. IMPRESSION: Mild patchy bilateral lower lobe opacities, likely atelectasis, with possible trace left pleural effusion. No frank interstitial edema. Endotracheal tube terminates 5.3 cm above the carina. Additional support apparatus as above. Electronically Signed   By: Julian Hy M.D.   On: 03/03/2021 05:57    Anti-infectives: Anti-infectives (From admission, onward)   Start     Dose/Rate Route Frequency Ordered Stop   03/04/21 1800  vancomycin (VANCOREADY) IVPB 750 mg/150 mL        750 mg 150 mL/hr over 60 Minutes Intravenous Every 24 hours 03/03/21 1442  03/03/21 0800  vancomycin (VANCOREADY) IVPB 1500 mg/300 mL  Status:  Discontinued        1,500 mg 150 mL/hr over 120 Minutes Intravenous Every 48 hours 03/02/21 1410 03/03/21 1442   03/02/21 0800  vancomycin (VANCOREADY) IVPB 750 mg/150 mL  Status:  Discontinued        750 mg 150 mL/hr over 60 Minutes Intravenous Every 24 hours 03/01/21 1954  03/02/21 1409   03/01/21 2200  ceFEPIme (MAXIPIME) 2 g in sodium chloride 0.9 % 100 mL IVPB        2 g 200 mL/hr over 30 Minutes Intravenous Every 12 hours 03/01/21 1903     03/01/21 2100  vancomycin (VANCOREADY) IVPB 1250 mg/250 mL        1,250 mg 166.7 mL/hr over 90 Minutes Intravenous  Once 03/01/21 1954 03/02/21 0008   03/01/21 2000  metroNIDAZOLE (FLAGYL) IVPB 500 mg        500 mg 100 mL/hr over 60 Minutes Intravenous Every 8 hours 03/01/21 1903     02/28/21 1800  piperacillin-tazobactam (ZOSYN) IVPB 3.375 g  Status:  Discontinued        3.375 g 12.5 mL/hr over 240 Minutes Intravenous Every 8 hours 02/28/21 1643 03/01/21 1903   02/28/21 1730  piperacillin-tazobactam (ZOSYN) IVPB 3.375 g  Status:  Discontinued        3.375 g 100 mL/hr over 30 Minutes Intravenous Every 8 hours 02/28/21 1635 02/28/21 1642   02/28/21 1300  Ampicillin-Sulbactam (UNASYN) 3 g in sodium chloride 0.9 % 100 mL IVPB  Status:  Discontinued        3 g 200 mL/hr over 30 Minutes Intravenous Every 6 hours 02/28/21 1156 02/28/21 1635   02/23/21 1230  piperacillin-tazobactam (ZOSYN) IVPB 3.375 g  Status:  Discontinued        3.375 g 12.5 mL/hr over 240 Minutes Intravenous Every 8 hours 02/23/21 1143 02/28/21 1155   02/22/21 0600  ciprofloxacin (CIPRO) IVPB 400 mg  Status:  Discontinued        400 mg 200 mL/hr over 60 Minutes Intravenous Every 12 hours 02/21/21 2028 02/23/21 1143   02/22/21 0400  metroNIDAZOLE (FLAGYL) IVPB 500 mg  Status:  Discontinued        500 mg 100 mL/hr over 60 Minutes Intravenous Every 8 hours 02/21/21 2028 02/23/21 1143   02/21/21 1830  ciprofloxacin (CIPRO) IVPB 400 mg        400 mg 200 mL/hr over 60 Minutes Intravenous  Once 02/21/21 1820 02/21/21 1939   02/21/21 1830  metroNIDAZOLE (FLAGYL) IVPB 500 mg        500 mg 100 mL/hr over 60 Minutes Intravenous  Once 02/21/21 1820 02/21/21 2110      Assessment/Plan:  Improving slowly May start gentle diuresis after weaning off  pressors May advance tf Continue supportive care Dr. Christian Mate to assess excess colon , He may choose to wait or resect the redundant tissue.   Caroleen Hamman, MD, [facility]  03/04/2021

## 2021-03-04 NOTE — Progress Notes (Signed)
PHARMACY CONSULT NOTE - FOLLOW UP  Pharmacy Consult for Electrolyte Monitoring and Replacement   Recent Labs: Potassium (mmol/L)  Date Value  03/04/2021 3.5   Magnesium (mg/dL)  Date Value  03/04/2021 2.3   Calcium (mg/dL)  Date Value  03/04/2021 7.5 (L)   Albumin (g/dL)  Date Value  03/02/2021 2.5 (L)   Phosphorus (mg/dL)  Date Value  03/04/2021 3.7   Sodium (mmol/L)  Date Value  03/04/2021 137   Corrected Ca: 8.1  Assessment: Desiree Madden is a 70 y.o. female admitted on 02/21/2021 with intra-abdominal infection (diverticulitis w/ fecal perforation). Patient septic with acute diverticulitis abdominal abscess with colovesical fistula and enterococcus urinary tract infection which was further complicated by the development of complicated diverticulitis with perforation and gangrenous changes requiring Hartman's procedure performed on 03/01/21. Pt developed severe septic shock after procedure requiring vasopressors. Additionally, pt has metabolic acidosis. PMH includes CAD s/p CABG (2015), HTN, hypothyroidism, colon bladder fistula, diverticulitis, and ulcerative colitis. Pharmacy has been consulted for electrolyte monitoring.  Fluids: LR @ 50 mL/hr   Free water 30 ml q4h Continous tube feeds  Goal of Therapy:  Electrolytes WNL  Plan:  3/26  K 3.5  Mag 2.3  Phos 3.7  Scr 1.34   Ca 7.5  Alb 2.5(3/25)   Corr.Ca=8.7 - no replacement at this time -- Monitor electrolytes with AM labs  Chinita Greenland PharmD Clinical Pharmacist 03/04/2021

## 2021-03-05 DIAGNOSIS — R7989 Other specified abnormal findings of blood chemistry: Secondary | ICD-10-CM | POA: Diagnosis not present

## 2021-03-05 DIAGNOSIS — K651 Peritoneal abscess: Secondary | ICD-10-CM

## 2021-03-05 DIAGNOSIS — J9601 Acute respiratory failure with hypoxia: Secondary | ICD-10-CM | POA: Diagnosis not present

## 2021-03-05 DIAGNOSIS — K5792 Diverticulitis of intestine, part unspecified, without perforation or abscess without bleeding: Secondary | ICD-10-CM | POA: Diagnosis not present

## 2021-03-05 LAB — BLOOD GAS, ARTERIAL
Acid-Base Excess: 1.6 mmol/L (ref 0.0–2.0)
Bicarbonate: 25.6 mmol/L (ref 20.0–28.0)
FIO2: 0.45
MECHVT: 450 mL
O2 Saturation: 97.8 %
PEEP: 5 cmH2O
Patient temperature: 37
pCO2 arterial: 36 mmHg (ref 32.0–48.0)
pH, Arterial: 7.46 — ABNORMAL HIGH (ref 7.350–7.450)
pO2, Arterial: 95 mmHg (ref 83.0–108.0)

## 2021-03-05 LAB — GLUCOSE, CAPILLARY
Glucose-Capillary: 150 mg/dL — ABNORMAL HIGH (ref 70–99)
Glucose-Capillary: 152 mg/dL — ABNORMAL HIGH (ref 70–99)
Glucose-Capillary: 169 mg/dL — ABNORMAL HIGH (ref 70–99)
Glucose-Capillary: 172 mg/dL — ABNORMAL HIGH (ref 70–99)
Glucose-Capillary: 178 mg/dL — ABNORMAL HIGH (ref 70–99)
Glucose-Capillary: 193 mg/dL — ABNORMAL HIGH (ref 70–99)

## 2021-03-05 LAB — BASIC METABOLIC PANEL
Anion gap: 8 (ref 5–15)
BUN: 45 mg/dL — ABNORMAL HIGH (ref 8–23)
CO2: 27 mmol/L (ref 22–32)
Calcium: 7.3 mg/dL — ABNORMAL LOW (ref 8.9–10.3)
Chloride: 108 mmol/L (ref 98–111)
Creatinine, Ser: 1.2 mg/dL — ABNORMAL HIGH (ref 0.44–1.00)
GFR, Estimated: 49 mL/min — ABNORMAL LOW (ref >60)
Glucose, Bld: 188 mg/dL — ABNORMAL HIGH (ref 70–99)
Potassium: 2.9 mmol/L — ABNORMAL LOW (ref 3.5–5.1)
Sodium: 143 mmol/L (ref 135–145)

## 2021-03-05 LAB — SURGICAL PATHOLOGY

## 2021-03-05 LAB — CBC
HCT: 19.5 % — ABNORMAL LOW (ref 36.0–46.0)
Hemoglobin: 6.6 g/dL — ABNORMAL LOW (ref 12.0–15.0)
MCH: 30.3 pg (ref 26.0–34.0)
MCHC: 33.8 g/dL (ref 30.0–36.0)
MCV: 89.4 fL (ref 80.0–100.0)
Platelets: 150 10*3/uL (ref 150–400)
RBC: 2.18 MIL/uL — ABNORMAL LOW (ref 3.87–5.11)
RDW: 15 % (ref 11.5–15.5)
WBC: 17.7 10*3/uL — ABNORMAL HIGH (ref 4.0–10.5)
nRBC: 0 % (ref 0.0–0.2)

## 2021-03-05 LAB — HEMOGLOBIN AND HEMATOCRIT, BLOOD
HCT: 22.6 % — ABNORMAL LOW (ref 36.0–46.0)
HCT: 24.6 % — ABNORMAL LOW (ref 36.0–46.0)
Hemoglobin: 7.7 g/dL — ABNORMAL LOW (ref 12.0–15.0)
Hemoglobin: 8.4 g/dL — ABNORMAL LOW (ref 12.0–15.0)

## 2021-03-05 LAB — PHOSPHORUS: Phosphorus: 2.9 mg/dL (ref 2.5–4.6)

## 2021-03-05 LAB — ALBUMIN: Albumin: 2.3 g/dL — ABNORMAL LOW (ref 3.5–5.0)

## 2021-03-05 LAB — LACTIC ACID, PLASMA: Lactic Acid, Venous: 1.1 mmol/L (ref 0.5–1.9)

## 2021-03-05 LAB — TRIGLYCERIDES: Triglycerides: 380 mg/dL — ABNORMAL HIGH (ref <150)

## 2021-03-05 LAB — MAGNESIUM: Magnesium: 1.9 mg/dL (ref 1.7–2.4)

## 2021-03-05 LAB — POTASSIUM: Potassium: 3.7 mmol/L (ref 3.5–5.1)

## 2021-03-05 LAB — PREPARE RBC (CROSSMATCH)

## 2021-03-05 MED ORDER — POTASSIUM CHLORIDE 10 MEQ/50ML IV SOLN
10.0000 meq | INTRAVENOUS | Status: AC
  Administered 2021-03-05 (×4): 10 meq via INTRAVENOUS
  Filled 2021-03-05 (×4): qty 50

## 2021-03-05 MED ORDER — SODIUM CHLORIDE 0.9% IV SOLUTION: Freq: Once | INTRAVENOUS | Status: AC

## 2021-03-05 MED ORDER — MAGNESIUM SULFATE 2 GM/50ML IV SOLN
2.0000 g | Freq: Once | INTRAVENOUS | Status: AC
  Administered 2021-03-05: 2 g via INTRAVENOUS
  Filled 2021-03-05: qty 50

## 2021-03-05 MED ORDER — DEXMEDETOMIDINE HCL IN NACL 400 MCG/100ML IV SOLN
0.4000 ug/kg/h | INTRAVENOUS | Status: DC
  Administered 2021-03-05: 0.4 ug/kg/h via INTRAVENOUS
  Administered 2021-03-06 – 2021-03-07 (×3): 0.6 ug/kg/h via INTRAVENOUS
  Filled 2021-03-05 (×6): qty 100

## 2021-03-05 MED ORDER — POTASSIUM CHLORIDE 20 MEQ PO PACK
40.0000 meq | PACK | Freq: Once | ORAL | Status: AC
  Administered 2021-03-05: 40 meq
  Filled 2021-03-05: qty 2

## 2021-03-05 MED ORDER — MIDAZOLAM HCL 2 MG/2ML IJ SOLN
1.0000 mg | INTRAMUSCULAR | Status: DC | PRN
  Administered 2021-03-06 – 2021-03-10 (×8): 2 mg via INTRAVENOUS
  Filled 2021-03-05 (×9): qty 2

## 2021-03-05 NOTE — Progress Notes (Signed)
GOALS OF CARE DISCUSSION  The Clinical status was relayed to family in detail. Daughter at bedside  Updated and notified of patients medical condition.  Patient remains unresponsive and will not open eyes to command.   Explained to family course of therapy and the modalities     Patient with  multiorgan failure with a very high probablity of a very minimal chance of meaningful recovery despite all aggressive and optimal medical therapy  PATIENT REMAINS FULL CODE  Family understands the situation.    Family are satisfied with Plan of action and management. All questions answered  Additional CC time 32 mins   Terrica Duecker Patricia Pesa, M.D.  [facility]  Medical Director [place] Director [facility] Cardio-Pulmonary Department

## 2021-03-05 NOTE — Progress Notes (Signed)
[facility] Cardiology    SUBJECTIVE: Intubated sedated but arousable able to follow simple commands   Vitals:   03/05/21 0545 03/05/21 0600 03/05/21 0615 03/05/21 0700  BP:      Pulse: 79 73 70 (!) 59  Resp: $Remo'16 12 13 12  'ILChU$ Temp: 97.88 F (36.6 C) 97.7 F (36.5 C) 97.7 F (36.5 C) (!) 97.52 F (36.4 C)  TempSrc:    Esophageal  SpO2: 98% 98% 100% 98%  Weight:      Height:         Intake/Output Summary (Last 24 hours) at 03/05/2021 2993 Last data filed at 03/05/2021 0700 Gross per 24 hour  Intake 2823.6 ml  Output 4110 ml  Net -1286.4 ml      PHYSICAL EXAM  General: Well developed, well nourished, in no acute distress HEENT:  Normocephalic and atramatic Neck:  No JVD.  Lungs: Clear bilaterally to auscultation and percussion. Heart: HRRR . Normal S1 and S2 without gallops or murmurs.  Abdomen: Bowel sounds are positive, abdomen soft and non-tender  Msk:  Back normal, normal gait. Normal strength and tone for age. Extremities: No clubbing, cyanosis or edema.   Neuro: Alert and oriented X 3. Psych:  Good affect, responds appropriately   LABS: Basic Metabolic Panel: Recent Labs    03/04/21 0454 03/05/21 0500  NA 137 143  K 3.5 2.9*  CL 101 108  CO2 27 27  GLUCOSE 165* 188*  BUN 43* 45*  CREATININE 1.34* 1.20*  CALCIUM 7.5* 7.3*  MG 2.3 1.9  PHOS 3.7 2.9   Liver Function Tests: Recent Labs    03/04/21 0454 03/05/21 0500  ALBUMIN 1.8* 2.3*   No results for input(s): LIPASE, AMYLASE in the last 72 hours. CBC: Recent Labs    03/04/21 0454 03/05/21 0644  WBC 23.5* 17.7*  HGB 9.5* 6.6*  HCT 27.4* 19.5*  MCV 86.4 89.4  PLT 142* 150   Cardiac Enzymes: No results for input(s): CKTOTAL, CKMB, CKMBINDEX, TROPONINI in the last 72 hours. BNP: Invalid input(s): POCBNP D-Dimer: No results for input(s): DDIMER in the last 72 hours. Hemoglobin A1C: No results for input(s): HGBA1C in the last 72 hours. Fasting Lipid Panel: Recent Labs    03/05/21 0500  TRIG  380*   Thyroid Function Tests: No results for input(s): TSH, T4TOTAL, T3FREE, THYROIDAB in the last 72 hours.  Invalid input(s): FREET3 Anemia Panel: No results for input(s): VITAMINB12, FOLATE, FERRITIN, TIBC, IRON, RETICCTPCT in the last 72 hours.  DG Chest Port 1 View  Result Date: 03/04/2021 CLINICAL DATA:  Check endotracheal tube placement EXAM: PORTABLE CHEST 1 VIEW COMPARISON:  Film from the previous day. FINDINGS: Cardiac shadow is stable. Postsurgical changes are again seen. Right-sided PICC line is noted in satisfactory position. Endotracheal tube and gastric catheter are seen in satisfactory position. Mild left basilar atelectasis and small effusion is seen. No other focal abnormality is noted. IMPRESSION: Tubes and lines as described in satisfactory position. Mild left basilar atelectasis with associated small effusion. Electronically Signed   By: Inez Catalina M.D.   On: 03/04/2021 03:44     Echo preserved left ventricular function ejection fraction between 60 and 6 6%  TELEMETRY: Normal sinus rhythm rate of about 70  ASSESSMENT AND PLAN:  Principal Problem:   Diverticulitis Active Problems:   Fistula, bladder   Ulcerative colitis ([place])   Hyperlipidemia   Essential hypertension   Hypothyroidism   Abnormal ECG   CKD (chronic kidney disease), stage III (HCC)   Elevated  lactic acid level Non-STEMI  Plan Continue postoperative care in ICU Patient still intubated sedated wean when able Recommend IV anticoagulation with heparin if okay with surgery Echocardiogram for assessment of left ventricular function Postoperative pain control Consider nephrology input for renal insufficiency Agree with broad-spectrum antibiotic therapy for possible sepsis Conservative cardiac input at this stage   Yolonda Kida, MD 03/05/2021 7:33 AM

## 2021-03-05 NOTE — Consult Note (Addendum)
[place] Nurse ostomy consult note Pt had colostomy surgery performed on 3/24.  Stoma is prolapsed approx 8 inches, with a diameter of 3 inches.  Mod amt pink drainage in the pouch and seeping around peristomal skin. Pt is critically ill in ICU and not ready for teaching, no family members present. Surgical PA at the bedside to assess appearance during pouch change. Previous regular-sized pouch was leaking behind the barrier and the diameter of the pouch ring was putting pressure on the distended stoma.  Applied 4 inch pouching kit Kellie Simmering [phone]) with a 3 inch opening cut out.  2 additional pouching systems ordered to the room for staff nurse use, if patient does not go to surgery for a revision.  No stool or flatus, small amt pink liquid in the pouch.   Enrolled patient in [place] program: Not yet  WOC Nurse Consult Note: Reason for Consult: Left buttock with dark purple deep tissue pressure injury; .2X2cm Pressure Injury POA: No Right ear with red stage 1 pressure injury; .3X.3cm, it is difficult to keep the patient off the affected area and reduce pressure, since she prevers to keep her head turned in this direction. Dressing procedure/placement/frequency:  Pt is on a low air-loss mattress to decrease pressure.  Topical treatment orders provided for staff nurses to perform as follows to protect from further injury: Foam dressing to buttocks, change Q 3 days or PRN soiling. [place] team will reassess buttocks weekly to determine if a change in the plan of care is indicated at that time.  Julien Girt MSN, RN, [place], [place], [place]

## 2021-03-05 NOTE — Progress Notes (Addendum)
[place] for Electrolyte Monitoring and Replacement   Recent Labs: Potassium (mmol/L)  Date Value  03/05/2021 3.7   Magnesium (mg/dL)  Date Value  03/05/2021 1.9   Calcium (mg/dL)  Date Value  03/05/2021 7.3 (L)   Albumin (g/dL)  Date Value  03/05/2021 2.3 (L)   Phosphorus (mg/dL)  Date Value  03/05/2021 2.9   Sodium (mmol/L)  Date Value  03/05/2021 143    Assessment: Desiree Madden is a 70 y.o. female admitted on 02/21/2021 with intra-abdominal infection (diverticulitis w/ fecal perforation). Patient septic with acute diverticulitis abdominal abscess with colovesical fistula and enterococcus urinary tract infection which was further complicated by the development of complicated diverticulitis with perforation and gangrenous changes requiring Hartman's procedure performed on 03/01/21. Pt developed severe septic shock after procedure requiring vasopressors. Additionally, pt has metabolic acidosis. PMH includes CAD s/p CABG (2015), HTN, hypothyroidism, colon bladder fistula, diverticulitis, and ulcerative colitis. Pharmacy has been consulted for electrolyte monitoring.  Free water 30 ml q4h Continous tube feeds  Goal of Therapy:  Electrolytes WNL  Plan:  3/28 AM labs: Na 143, K 2.9, Phos 2.9, Mg 1.9 --Provider ordered enteral KCl 40 mEq x 1 and IV KCl 10 mEq x 4 --Provider ordered IV magnesium sulfate 2 g x 1 --No further electrolyte replacement warranted --Follow-up electrolytes with AM labs  Benita Gutter 03/05/2021

## 2021-03-05 NOTE — Progress Notes (Signed)
Nutrition Follow-up  DOCUMENTATION CODES:   Not applicable  INTERVENTION:   Continue Vital High Protein @ 45 ml/hr via OGT and advance as tolerated to goal rate of 55 ml/hr  30 ml free water flush every 4 hours  Tube feeding regimen provides 1320 kcal (95% of needs), 115 grams of protein, and 1104 ml of H2O. Total free water: 1284 ml daily.   TF + propofol to provide 1480 kcals daily  NUTRITION DIAGNOSIS:   Inadequate oral intake related to acute illness as evidenced by NPO status.  Ongoing  GOAL:   Provide needs based on ASPEN/SCCM guidelines  Progressing  MONITOR:   Vent status,Labs,Weight trends,Skin,I & O's  REASON FOR ASSESSMENT:   NPO/Clear Liquid Diet    ASSESSMENT:   70 y.o. female with h/o HTN, CKD III, colovesical fistula, hypothroidism and HLD who is admitted with diverticulitis and constipation.  Patient is currently intubated on ventilator support MV: 5.7 L/min Temp (24hrs), Avg:97.4 F (36.3 C), Min:96.62 F (35.9 C), Max:98.06 F (36.7 C)  Propofol: 6.05 ml/hr (160 kcals daily)   Reviewed I/O's: -1.3 L X 24 hours and +18.4 L since admission  UOP: 2.4 L x 24 hours  Drain output: 535 ml x 24 hours  Colostomy output:: 1.2 L x 24 hours  Case discussed with RN, MD, and in ICU rounds. Tricle feeds were initiated on 03/03/21. Per RN, Vital High Protein currently infusing via OGT at 35 ml/hr with plans to advance to 45 ml/hr today. Pt is tolerating TF well. CUrrent regimen (45 ml/hr) provides 1080 kcals (1240 kcals with propofol), 94 grams protein, and 903 ml free water daily.   Medications reviewed and include colace, solu-cortef, miralax, albumin, phenylephrine, and vasopressin.    Labs reviewed: CBGS: 152-172 (inpatient orders for glycemic control are 0-15 units insulin aspart every 4 hours).   Diet Order:   Diet Order            Diet NPO time specified  Diet effective now                 EDUCATION NEEDS:   Not appropriate for  education at this time  Skin:  Skin Assessment: Skin Integrity Issues: Skin Integrity Issues:: Incisions,DTI,Stage I DTI: buttocks Stage I: rt ear Incisions: closed abdomen  Last BM:  03/05/21 (30 ml via colostomy)  Height:   Ht Readings from Last 1 Encounters:  03/01/21 $RemoveB'5\' 4"'lASiSTgr$  (1.626 m)    Weight:   Wt Readings from Last 1 Encounters:  03/01/21 67.2 kg    Ideal Body Weight:  54.5 kg  BMI:  Body mass index is 25.43 kg/m.  Estimated Nutritional Needs:   Kcal:  1383  Protein:  95-110g/day  Fluid:  1.4-1.7L/day    Loistine Chance, RD, LDN, [place] Registered Dietitian II Certified Diabetes Care and Education Specialist Please refer to [facility] for RD and/or RD on-call/weekend/after hours pager

## 2021-03-05 NOTE — Progress Notes (Signed)
[place] Hospital Day(s): 12.   Post op day(s): 4 Days Post-Op.   Interval History:  Patient seen and examined; remains intubated Weaning off vasopressor support; Phenylephrine at 40 mcg/min No acute events or new complaints overnight.  Her leukocytosis continues to improve; 17.7K Hgb down to 6.6; plan for transfusion today Renal function is stable, sCr - 1.20, UO - 2.4L Hypokalemia to 2.9 Surgical drain with 535 ccs out; serosanguinous Continues on cefepime, flagyl, vancomycin Colostomy output recorded at 1.1L; this was matured yesterday 03/27 Advancing tube feedings  Vital signs in last 24 hours: [min-max] current  Temp:  [96.62 F (35.9 C)-97.88 F (36.6 C)] 97.52 F (36.4 C) (03/28 0700) Pulse Rate:  [52-102] 59 (03/28 0700) Resp:  [10-22] 12 (03/28 0700) SpO2:  [93 %-100 %] 97 % (03/28 0751) Arterial Line BP: (83-124)/(42-63) 120/56 (03/28 0700) FiO2 (%):  [45 %-50 %] 45 % (03/28 0751)     Height: $Remove'5\' 4"'urqlUVr$  (162.6 cm) Weight: 67.2 kg BMI (Calculated): 25.42   Intake/Output last 2 shifts:  03/27 0701 - 03/28 0700 In: 2823.6 [I.V.:1668.6; NG/GT:329.1; IV Piggyback:825.9] Out: 82 [Urine:2410; Drains:535; SLHTD:4287]   Physical Exam:  Constitutional: Intubated, opens eyes Respiratory: On ventilator Cardiovascular: regular rate and sinus rhythm  Gastrointestinal: soft, unable to assess tenderness, non-distended. Colostomy in the LUQ, now matured, significant protrusion, liquid stool in bag. Surgical drain in RLQ with serosanguinous output Genitourinary: Foley in palce Integumentary: Laparotomy incision is CDI with staples, wicks in place, serosanguinous drainage, no erythema   Labs:  CBC Latest Ref Rng & Units 03/05/2021 03/04/2021 03/03/2021  WBC 4.0 - 10.5 K/uL 17.7(H) 23.5(H) 36.3(H)  Hemoglobin 12.0 - 15.0 g/dL 6.6(L) 9.5(L) 9.4(L)  Hematocrit 36.0 - 46.0 % 19.5(L) 27.4(L) 27.2(L)  Platelets 150 - 400 K/uL 150 142(L) 147(L)    CMP Latest Ref Rng & Units 03/05/2021 03/04/2021 03/03/2021  Glucose 70 - 99 mg/dL 188(H) 165(H) -  BUN 8 - 23 mg/dL 45(H) 43(H) -  Creatinine 0.44 - 1.00 mg/dL 1.20(H) 1.34(H) -  Sodium 135 - 145 mmol/L 143 137 -  Potassium 3.5 - 5.1 mmol/L 2.9(L) 3.5 3.3(L)  Chloride 98 - 111 mmol/L 108 101 -  CO2 22 - 32 mmol/L 27 27 -  Calcium 8.9 - 10.3 mg/dL 7.3(L) 7.5(L) -  Total Protein 6.5 - 8.1 g/dL - - -  Total Bilirubin 0.3 - 1.2 mg/dL - - -  Alkaline Phos 38 - 126 U/L - - -  AST 15 - 41 U/L - - -  ALT 0 - 44 U/L - - -     Imaging studies: No new pertinent imaging studies   Assessment/Plan:  70 y.o. critically-ill female weaning from vasopressor support 4 Days Post-Op s/p Hartman's Procedure for complicated diverticulitis with gangrenous changes and perforation with feculent peritonitis.   - Defer further colostomy resection, maturation to Dr Christian Mate   Faythe Ghee to continue tube feedings  - Appreciate PCCM assistance; vasopressor and ventilator support               - Continue Abx (Vancomycin, cefepime, flagyl)             - Monitor abdominal examination             - Monitor colostomy output; WOC RN following              - Monitor leukocytosis; improving             - Maintain foley catheter; monitor  UO                 - Midline wound care: Leave wicks in place, cover with dry gauze and ABD pads   All of the above findings and recommendations were discussed with the medical team at bedside  -- Edison Simon, PA-C [facility] 03/05/2021, 7:58 AM [phone] M-F: 7am - 4pm

## 2021-03-05 NOTE — Progress Notes (Signed)
   03/05/21 1150  Clinical Encounter Type  Visited With Patient and family together  Visit Type Initial;Spiritual support;Social support  Referral From Nurse  Consult/Referral To Chaplain   Pt's granddaughter requested to have chaplain pray with PT. Quarry manager ministered with presence, prayer, and reflective listening.

## 2021-03-05 NOTE — Progress Notes (Signed)
NAME:  Desiree Madden, MRN:  [id number], DOB:  Sep 21, 1951, LOS: 12 ADMISSION DATE:  02/21/2021,   CONSULTATION DATE:  03/01/2021 REFERRING MD:  Dr. Christian Mate, CHIEF COMPLAINT:  Septic shock   Brief Pt Description:  70 y.o. Female admitted 02/21/2021 due to Diverticulitis with Colovesical fistula and UTI. On 03/01/21 she developed complicated Diverticulitis with perforation requiring Sigmoid colectomy with end colostomy and Hartman's procedure.  Post procedure she returns to ICU and remains intubated with Septic Shock requiring multiple vasopressors.  History of Present Illness:  Desiree Madden is a 70 y.o. Female with a past medical history as listed below who presented to [facility] ED on 02/21/2021 with severe lower abdominal pain and dysuria. She denied fever, chills, or vomiting.  Of note she had been on and off antibiotics over the past several months for recurrent UTI's related to her Colovesical fistula. She was supposed to have surgery for the fistula in the fall of 2021, but it never occurred.  ED Course: CT Abdomen & Pelvis showed evidence of possible diverticulitis and constipation, could not rule out colonic neoplasm.  She was admitted to the Med-Surg unit by the Hospitalist for further workup and treatment of Diverticulitis and UTI.  GI and General Surgery were consulted.  Hospital Course: Pt elected to try medical alternatives instead of proceeding with surgery.  However on 02/28/21 she had worsening fever and Leukocytosis. Repeat CT Abdomen/Pelvis showed an abscess with likely fistula formation, of which a Percutaneous Drain was placed by IR. Despite drain placement, on 03/01/21 her leukocytosis continued to worsen.  Decision was made to proceed with surgery.  She was found to have complicated Diverticulitis with perforation and gangrene of the sigmoid colon, she required a sigmoid colectomy with end colostomy and Hartman's procedure.  She returns to ICU post procedure with severe  septic shock and remains intubated.  Pertinent  Medical History  Hypertension Hypothyroidism Ulcerative colitis Diverticulitis Colon-Bladder fistula  Significant Hospital Events: Including procedures, antibiotic start and stop dates in addition to other pertinent events    02/21/2021: Admitted to Med/Surg unit with Diverticulitis and UTI  02/28/2021: LLQ abdominal drain placed by IR to pelvic abscess  03/01/2021: Worsening Leukocytosis despite drain placement. Found to have complicated Diverticulitis with perforation requiring Sigmoid Colectomy with end colostomy and Hartman's Procedure.  Post procedure she returns to ICU and remains intubated with Septic Shock requiring multiple vasopressors.  Left Femoral CVC and Arterial lines placed emergently  03/04/2021: Severe anasarca, albumin 1.8  3/28 failure to wean from vent    Cultures:  02/21/21: SARS CoV-2 PCR>>negative 02/21/21: Influenza PCR>>negative 02/21/2021: Urine>>ENTEROCOCCUS FAECALIS,  ENTEROCOCCUS FAECIUM(sensitive to vancomycin and ampicillin) 02/22/2021: HIV screen>> nonreactive 02/28/2021: LLQ Abdominal drain>> Enterococcus avium, other polymicrobial  03/01/2021: MRSA PCR>> negative 03/01/2021: Blood culture x2>> with  Antimicrobials:  Ciprofloxacin 3/16>>3/18 Unasyn 3/23>>3/23 Flagyl 3/16>>3/18; 3/24>> Cefepime 3/24>> Vancomycin 3/24>>    Interim History / Subjective:  Remains on vent critically ill multiorgan failure   Objective   Blood pressure (!) 102/28, pulse (!) 59, temperature (!) 97.52 F (36.4 C), temperature source Esophageal, resp. rate 12, height 5' 4"  (1.626 m), weight 67.2 kg, SpO2 98 %.    Vent Mode: SIMV FiO2 (%):  [50 %] 50 % Set Rate:  [12 bmp] 12 bmp Vt Set:  [450 mL] 450 mL PEEP:  [5 cmH20] 5 cmH20 Plateau Pressure:  [14 cmH20-16 cmH20] 15 cmH20   Intake/Output Summary (Last 24 hours) at 03/05/2021 0751 Last data filed at 03/05/2021 0700 Gross per 24 hour  Intake 2823.6 ml   Output 4110 ml  Net -1286.4 ml   Filed Weights   02/27/21 0900 02/28/21 1014 03/01/21 1511  Weight: 66.9 kg 67.2 kg 67.2 kg    REVIEW OF SYSTEMS  PATIENT IS UNABLE TO PROVIDE COMPLETE REVIEW OF SYSTEM S DUE TO SEVERE CRITICAL ILLNESS AND ENCEPHALOPATHY   PHYSICAL EXAMINATION:  GENERAL:critically ill appearing, +resp distress HEAD: Normocephalic, atraumatic.  EYES: Pupils equal, round, reactive to light.  No scleral icterus.  MOUTH: Moist mucosal membrane. NECK: Supple. No thyromegaly. No nodules. No JVD.  PULMONARY: +rhonchi, +wheezing CARDIOVASCULAR: S1 and S2. Regular rate and rhythm. No murmurs, rubs, or gallops.  GASTROINTESTINAL: Soft, nontender, -distended. Positive bowel sounds.  MUSCULOSKELETAL: No swelling, clubbing, or edema.  NEUROLOGIC: obtunded SKIN:intact,warm,dry    Resolved Hospital Problem list   n/a  Assessment & Plan:  70 yo  with acute and severe septic shock with hemorrhagic shock with acute hypoxic resp failure with NSTEMI due to complicated Complicated Diverticulitis/Pelvic Abscess with Perforation & Gangrene of the Sigmoid Colon, Colovesical Fistula   Severe ACUTE Hypoxic and Hypercapnic Respiratory Failure -continue Mechanical Ventilator support -continue Bronchodilator Therapy -Wean Fio2 and PEEP as tolerated -VAP/VENT bundle implementation -will perform SAT/SBT when respiratory parameters are met   ACUTE DIASTOLIC CARDIAC FAILURE- EF 50% -oxygen as needed -follow up cardiac enzymes as indicated left ventricle demonstrates  regional wall motion abnormalities (Concern for basal to mid inferior wall  hypokinesis). Left ventricular   ACUTE KIDNEY INJURY/Renal Failure -continue Foley Catheter-assess need -Avoid nephrotoxic agents -Follow urine output, BMP -Ensure adequate renal perfusion, optimize oxygenation -Renal dose medications  INFECTIOUS DISEASE -continue antibiotics as prescribed -follow up cultures Wbc elevated   GI GI  PROPHYLAXIS as indicated  NUTRITIONAL STATUS DIET-->NPO  ENDO - ICU hypoglycemic\Hyperglycemia protocol -check FSBS per protocol  ACUTE ANEMIA- TRANSFUSE AS NEEDED DVT PRX with TED/SCD's ONLY TRANSFUSE if HGB<7     DVT/GI PRX ordered and assessed TRANSFUSIONS AS NEEDED MONITOR FSBS I Assessed the need for Labs I Assessed the need for Foley I Assessed the need for Central Venous Line Family Discussion when available I Assessed the need for Mobilization I made an Assessment of medications to be adjusted accordingly Safety Risk assessment completed  CASE DISCUSSED IN MULTIDISCIPLINARY ROUNDS WITH ICU TEAM   Best practice (evaluated daily)  Diet: npo Pain/Anxiety/Delirium protocol VAP protocol  DVT prophylaxis: lmwh GI prophylaxis: ppi Disposition:icu   Labs   CBC: Recent Labs  Lab 03/01/21 2022 03/02/21 0113 03/02/21 1812 03/03/21 0510 03/04/21 0454 03/05/21 0644  WBC 27.1* 32.5* 40.6* 36.3* 23.5* 17.7*  NEUTROABS 24.5* 30.7*  --   --   --   --   HGB 6.1* 9.8* 9.7* 9.4* 9.5* 6.6*  HCT 18.7* 29.4* 28.3* 27.2* 27.4* 19.5*  MCV 89.9 89.1 86.3 85.5 86.4 89.4  PLT 237 187 184 147* 142* 638    Basic Metabolic Panel: Recent Labs  Lab 03/01/21 0622 03/01/21 1411 03/02/21 0113 03/02/21 0344 03/02/21 1812 03/03/21 0200 03/03/21 0510 03/03/21 1644 03/04/21 0454 03/05/21 0500  NA 137   < >  --  138 139  --  138  --  137 143  K 2.7*   < >  --  3.3* 2.8* 3.0* 3.1* 3.3* 3.5 2.9*  CL 110   < >  --  105 101  --  101  --  101 108  CO2 15*   < >  --  19* 23  --  28  --  27 27  GLUCOSE 124*   < >  --  189* 175*  --  146*  --  165* 188*  BUN 30*   < >  --  33* 36*  --  34*  --  43* 45*  CREATININE 1.63*   < >  --  1.58* 1.43*  --  1.30*  --  1.34* 1.20*  CALCIUM 7.5*   < >  --  6.8* 6.9*  --  6.7*  --  7.5* 7.3*  MG 1.9  --  1.8  --  1.9  --  2.4 2.2 2.3 1.9  PHOS 5.2*  --  8.1*  --   --   --  4.4  --  3.7 2.9   < > = values in this interval not displayed.    GFR: Estimated Creatinine Clearance: 41.1 mL/min (A) (by C-G formula based on SCr of 1.2 mg/dL (H)). Recent Labs  Lab 03/01/21 2022 03/01/21 2154 03/02/21 0109 03/02/21 0113 03/02/21 0333 03/02/21 0610 03/02/21 1812 03/03/21 0510 03/04/21 0454 03/05/21 0644  PROCALCITON 7.56  --   --  20.51  --   --   --  26.93  --   --   WBC 27.1*  --   --  32.5*  --   --  40.6* 36.3* 23.5* 17.7*  LATICACIDVEN 7.0*   < > 4.0*  --  2.6* 1.9 1.6  --   --   --    < > = values in this interval not displayed.    Liver Function Tests: Recent Labs  Lab 03/01/21 2022 03/02/21 0344 03/04/21 0454 03/05/21 0500  AST 34 62*  --   --   ALT 12 16  --   --   ALKPHOS 55 58  --   --   BILITOT 0.7 1.3*  --   --   PROT 3.7* 4.6*  --   --   ALBUMIN 1.7* 2.5* 1.8* 2.3*   No results for input(s): LIPASE, AMYLASE in the last 168 hours. No results for input(s): AMMONIA in the last 168 hours.  ABG    Component Value Date/Time   PHART 7.32 (L) 03/02/2021 0800   PCO2ART 42 03/02/2021 0800   PO2ART 87 03/02/2021 0800   HCO3 21.6 03/02/2021 0800   ACIDBASEDEF 4.3 (H) 03/02/2021 0800   O2SAT 95.8 03/02/2021 0800     Coagulation Profile: Recent Labs  Lab 03/01/21 2022 03/02/21 0333  INR 1.7* 1.5*    Cardiac Enzymes: No results for input(s): CKTOTAL, CKMB, CKMBINDEX, TROPONINI in the last 168 hours.  HbA1C: No results found for: HGBA1C  CBG: Recent Labs  Lab 03/04/21 1132 03/04/21 1534 03/04/21 1928 03/04/21 2326 03/05/21 0340  GLUCAP 155* 156* 152* 158* 150*     Past Medical History:  She,  has a past medical history of Hypertension and Thyroid disease.   Surgical History:   Past Surgical History:  Procedure Laterality Date  . CARDIAC SURGERY    . COLECTOMY WITH COLOSTOMY CREATION/HARTMANN PROCEDURE Left 03/01/2021   Procedure: COLECTOMY WITH COLOSTOMY CREATION/HARTMANN PROCEDURE;  Surgeon: Ronny Bacon, MD;  Location: [facility] ORS;  Service: General;  Laterality: Left;      Social History:   reports that she has quit smoking. She has never used smokeless tobacco. She reports that she does not drink alcohol and does not use drugs.   Family History:  Her family history includes Arthritis in an other family member; CAD in her mother.   Allergies Allergies  Allergen Reactions  .  Baclofen     Other reaction(s): Headache  . Sertraline     Other reaction(s): Other (See Comments), Other (See Comments) Muscle aches Muscle aches   . Sulfa Antibiotics     Other reaction(s): Other (See Comments), Other (See Comments) Pt was told not to take it due to colitis Pt was told not to take it due to colitis   . Ibuprofen Nausea And Vomiting  . Trazodone And Nefazodone      Home Medications  Prior to Admission medications   Medication Sig Start Date End Date Taking? Authorizing Provider  alendronate (FOSAMAX) 70 MG tablet Take 70 mg by mouth once a week. Take with a full glass of water on an empty stomach.   Yes [provider]  aspirin EC 81 MG tablet Take 81 mg by mouth daily.   Yes [provider]  atorvastatin (LIPITOR) 20 MG tablet Take 20 mg by mouth daily.   Yes [provider]  diclofenac Sodium (VOLTAREN) 1 % GEL Apply topically 4 (four) times daily.   Yes [provider]  fluticasone (FLONASE) 50 MCG/ACT nasal spray Place 2 sprays into both nostrils daily.   Yes [provider]  gabapentin (NEURONTIN) 300 MG capsule Take 300 mg by mouth 3 (three) times daily.   Yes [provider]  levothyroxine (SYNTHROID) 75 MCG tablet Take 75 mcg by mouth daily before breakfast.   Yes [provider]  meloxicam (MOBIC) 15 MG tablet Take 15 mg by mouth daily.   Yes [provider]  metoprolol succinate (TOPROL-XL) 50 MG 24 hr tablet Take 50 mg by mouth daily. 11/23/20  Yes [provider]  oxyCODONE (OXY IR/ROXICODONE) 5 MG immediate release tablet Take 5 mg by mouth every 12 (twelve) hours  as needed.   Yes [provider]  traZODone (DESYREL) 100 MG tablet Take 50-100 mg by mouth at bedtime. Take 1/2 tablet by mouth each evening for sleep, can increase to 144m whole tablet after 1st week.   Yes [provider]      Critical Care Time devoted to patient care services described in this note is 54 minutes.   Overall, patient is critically ill, prognosis is guarded.  Patient with Multiorgan failure and at high risk for cardiac arrest and death.    KCorrin Parker M.D.  LVelora HecklerPulmonary & Critical Care Medicine  Medical Director IErieDirector ASelect [facility] Ohio[facility]io-Pulmonary Department

## 2021-03-06 ENCOUNTER — Inpatient Hospital Stay: Payer: Medicare HMO

## 2021-03-06 DIAGNOSIS — N321 Vesicointestinal fistula: Secondary | ICD-10-CM | POA: Diagnosis not present

## 2021-03-06 DIAGNOSIS — N189 Chronic kidney disease, unspecified: Secondary | ICD-10-CM

## 2021-03-06 DIAGNOSIS — D649 Anemia, unspecified: Secondary | ICD-10-CM

## 2021-03-06 DIAGNOSIS — K5720 Diverticulitis of large intestine with perforation and abscess without bleeding: Principal | ICD-10-CM

## 2021-03-06 DIAGNOSIS — R6521 Severe sepsis with septic shock: Secondary | ICD-10-CM | POA: Diagnosis not present

## 2021-03-06 DIAGNOSIS — K5792 Diverticulitis of intestine, part unspecified, without perforation or abscess without bleeding: Secondary | ICD-10-CM | POA: Diagnosis not present

## 2021-03-06 LAB — CBC
HCT: 24.9 % — ABNORMAL LOW (ref 36.0–46.0)
Hemoglobin: 8.3 g/dL — ABNORMAL LOW (ref 12.0–15.0)
MCH: 29.7 pg (ref 26.0–34.0)
MCHC: 33.3 g/dL (ref 30.0–36.0)
MCV: 89.2 fL (ref 80.0–100.0)
Platelets: 142 10*3/uL — ABNORMAL LOW (ref 150–400)
RBC: 2.79 MIL/uL — ABNORMAL LOW (ref 3.87–5.11)
RDW: 15.4 % (ref 11.5–15.5)
WBC: 19.1 10*3/uL — ABNORMAL HIGH (ref 4.0–10.5)
nRBC: 0 % (ref 0.0–0.2)

## 2021-03-06 LAB — BPAM RBC
Blood Product Expiration Date: 202204012359
ISSUE DATE / TIME: 202203281107
Unit Type and Rh: 9500

## 2021-03-06 LAB — CULTURE, BLOOD (ROUTINE X 2)
Culture: NO GROWTH
Culture: NO GROWTH
Special Requests: ADEQUATE
Special Requests: ADEQUATE

## 2021-03-06 LAB — TYPE AND SCREEN
ABO/RH(D): O POS
Antibody Screen: NEGATIVE
Unit division: 0

## 2021-03-06 LAB — BLOOD GAS, ARTERIAL
Acid-base deficit: 0.4 mmol/L (ref 0.0–2.0)
Bicarbonate: 25.4 mmol/L (ref 20.0–28.0)
FIO2: 1
MECHVT: 450 mL
O2 Saturation: 97.4 %
PEEP: 10 cmH2O
Patient temperature: 37
RATE: 15 resp/min
pCO2 arterial: 46 mmHg (ref 32.0–48.0)
pH, Arterial: 7.35 (ref 7.350–7.450)
pO2, Arterial: 100 mmHg (ref 83.0–108.0)

## 2021-03-06 LAB — BASIC METABOLIC PANEL
Anion gap: 6 (ref 5–15)
BUN: 45 mg/dL — ABNORMAL HIGH (ref 8–23)
CO2: 28 mmol/L (ref 22–32)
Calcium: 7.8 mg/dL — ABNORMAL LOW (ref 8.9–10.3)
Chloride: 112 mmol/L — ABNORMAL HIGH (ref 98–111)
Creatinine, Ser: 1.18 mg/dL — ABNORMAL HIGH (ref 0.44–1.00)
GFR, Estimated: 50 mL/min — ABNORMAL LOW (ref >60)
Glucose, Bld: 202 mg/dL — ABNORMAL HIGH (ref 70–99)
Potassium: 3.4 mmol/L — ABNORMAL LOW (ref 3.5–5.1)
Sodium: 146 mmol/L — ABNORMAL HIGH (ref 135–145)

## 2021-03-06 LAB — MAGNESIUM: Magnesium: 2.3 mg/dL (ref 1.7–2.4)

## 2021-03-06 LAB — GLUCOSE, CAPILLARY
Glucose-Capillary: 155 mg/dL — ABNORMAL HIGH (ref 70–99)
Glucose-Capillary: 178 mg/dL — ABNORMAL HIGH (ref 70–99)
Glucose-Capillary: 182 mg/dL — ABNORMAL HIGH (ref 70–99)
Glucose-Capillary: 187 mg/dL — ABNORMAL HIGH (ref 70–99)
Glucose-Capillary: 202 mg/dL — ABNORMAL HIGH (ref 70–99)
Glucose-Capillary: 226 mg/dL — ABNORMAL HIGH (ref 70–99)

## 2021-03-06 LAB — ALBUMIN: Albumin: 2.9 g/dL — ABNORMAL LOW (ref 3.5–5.0)

## 2021-03-06 LAB — PHOSPHORUS: Phosphorus: 3.2 mg/dL (ref 2.5–4.6)

## 2021-03-06 MED ORDER — FENTANYL CITRATE (PF) 100 MCG/2ML IJ SOLN: 100.0000 ug | Freq: Once | INTRAMUSCULAR | Status: AC

## 2021-03-06 MED ORDER — FLUCONAZOLE IN SODIUM CHLORIDE 400-0.9 MG/200ML-% IV SOLN
400.0000 mg | Freq: Once | INTRAVENOUS | Status: AC
  Administered 2021-03-07: 400 mg via INTRAVENOUS
  Filled 2021-03-06: qty 200

## 2021-03-06 MED ORDER — MORPHINE SULFATE (PF) 2 MG/ML IV SOLN
INTRAVENOUS | Status: AC
  Filled 2021-03-06: qty 1

## 2021-03-06 MED ORDER — MIDODRINE HCL 5 MG PO TABS
10.0000 mg | ORAL_TABLET | Freq: Three times a day (TID) | ORAL | Status: DC
  Administered 2021-03-06 – 2021-03-23 (×47): 10 mg
  Filled 2021-03-06 (×48): qty 2

## 2021-03-06 MED ORDER — VECURONIUM BROMIDE 10 MG IV SOLR: 20.0000 mg | Freq: Once | INTRAVENOUS | Status: AC

## 2021-03-06 MED ORDER — VECURONIUM BROMIDE 10 MG IV SOLR
INTRAVENOUS | Status: AC
  Administered 2021-03-06: 20 mg via INTRAVENOUS
  Filled 2021-03-06: qty 20

## 2021-03-06 MED ORDER — FREE WATER
100.0000 mL | Status: DC
  Administered 2021-03-06: 100 mL

## 2021-03-06 MED ORDER — SODIUM CHLORIDE 0.9 % IV SOLN
1.0000 g | Freq: Two times a day (BID) | INTRAVENOUS | Status: DC
  Filled 2021-03-06 (×2): qty 1

## 2021-03-06 MED ORDER — FENTANYL CITRATE (PF) 100 MCG/2ML IJ SOLN
INTRAMUSCULAR | Status: AC
  Administered 2021-03-06: 100 ug via INTRAVENOUS
  Filled 2021-03-06: qty 4

## 2021-03-06 MED ORDER — MIDAZOLAM HCL 2 MG/2ML IJ SOLN
INTRAMUSCULAR | Status: AC
  Administered 2021-03-06: 4 mg via INTRAVENOUS
  Filled 2021-03-06: qty 4

## 2021-03-06 MED ORDER — POTASSIUM CHLORIDE 20 MEQ PO PACK
20.0000 meq | PACK | Freq: Once | ORAL | Status: AC
  Administered 2021-03-06: 20 meq
  Filled 2021-03-06: qty 1

## 2021-03-06 MED ORDER — MORPHINE SULFATE (PF) 2 MG/ML IV SOLN
2.0000 mg | Freq: Once | INTRAVENOUS | Status: AC
  Administered 2021-03-06: 2 mg via INTRAVENOUS

## 2021-03-06 MED ORDER — MIDAZOLAM HCL 2 MG/2ML IJ SOLN: 4.0000 mg | Freq: Once | INTRAMUSCULAR | Status: AC

## 2021-03-06 MED ORDER — BLISTEX MEDICATED EX OINT
TOPICAL_OINTMENT | CUTANEOUS | Status: DC | PRN
  Filled 2021-03-06: qty 6.3

## 2021-03-06 NOTE — Progress Notes (Addendum)
[place] for Electrolyte Monitoring and Replacement   Recent Labs: Potassium (mmol/L)  Date Value  03/06/2021 3.4 (L)   Magnesium (mg/dL)  Date Value  03/06/2021 2.3   Calcium (mg/dL)  Date Value  03/06/2021 7.8 (L)   Albumin (g/dL)  Date Value  03/06/2021 2.9 (L)   Phosphorus (mg/dL)  Date Value  03/06/2021 3.2   Sodium (mmol/L)  Date Value  03/06/2021 146 (H)    Assessment: Desiree Madden is a 70 y.o. female admitted on 02/21/2021 with intra-abdominal infection (diverticulitis w/ fecal perforation). Patient septic with acute diverticulitis abdominal abscess with colovesical fistula and enterococcus urinary tract infection which was further complicated by the development of complicated diverticulitis with perforation and gangrenous changes requiring Hartman's procedure performed on 03/01/21. Pt developed severe septic shock after procedure requiring vasopressors. Additionally, pt has metabolic acidosis. PMH includes CAD s/p CABG (2015), HTN, hypothyroidism, colon bladder fistula, diverticulitis, and ulcerative colitis. Pharmacy has been consulted for electrolyte monitoring.  Patient was extubated 3/29  Free water 100 ml q4h Continous tube feeds  Goal of Therapy:  Electrolytes WNL  Plan:  3/29 AM labs: Na 146, K 3.4, Phos 3.2, Mg 2.3 --Increase free water flushes as above --KCl 20 mEq per tube x 1 --Follow-up electrolytes with AM labs  Benita Gutter 03/06/2021

## 2021-03-06 NOTE — Progress Notes (Signed)
GOALS OF CARE DISCUSSION  The Clinical status was relayed to family in detail. Daughter at bedside  Updated and notified of patients medical condition.  Explained to family course of therapy and the modalities   Will perform SAT/SBT and assess for trial of extubation Family understands the situation.  Patient remains full CODE Will plan to re-intubate if needed   Family are satisfied with Plan of action and management. All questions answered  Additional CC time 32 mins   Melina Mosteller Patricia Pesa, M.D.  [facility]  Medical Director [place] Director [facility] Cardio-Pulmonary Department

## 2021-03-06 NOTE — Procedures (Signed)
Endotracheal Intubation: Patient required placement of an artificial airway secondary to Respiratory Failure  Consent: Emergent.   Hand washing performed prior to starting the procedure.   Medications administered for sedation prior to procedure:  Midazolam 4 mg IV,  Vecuronium 20 mg IV, Fentanyl 100 mcg IV.    A time out procedure was called and correct patient, name, & ID confirmed. Needed supplies and equipment were assembled and checked to include ETT, 10 ml syringe, Glidescope, Mac and Miller blades, suction, oxygen and bag mask valve, end tidal CO2 monitor.   Patient was positioned to align the mouth and pharynx to facilitate visualization of the glottis.   Heart rate, SpO2 and blood pressure was continuously monitored during the procedure. Pre-oxygenation was conducted prior to intubation and endotracheal tube was placed through the vocal cords into the trachea.     The artificial airway was placed under direct visualization via glidescope route using a 8.0 ETT on the first attempt.  ETT was secured at 23 cm mark.  Placement was confirmed by auscuitation of lungs with good breath sounds bilaterally and no stomach sounds.  Condensation was noted on endotracheal tube.   Pulse ox 98%.  CO2 detector in place with appropriate color change.   Complications: None .   Operator: Mariavictoria Nottingham.   Chest radiograph ordered and pending.   Comments: OGT placed via glidescope.  Corrin Parker, M.D.  [facility]  Medical Director [place] Director [facility] Cardio-Pulmonary Department

## 2021-03-06 NOTE — Progress Notes (Signed)
NAME:  Desiree Madden, MRN:  [id number], DOB:  10/01/1951, LOS: 89 ADMISSION DATE:  02/21/2021,   CONSULTATION DATE:  03/01/2021 REFERRING MD:  Dr. Christian Mate, CHIEF COMPLAINT:  Septic shock   Brief Pt Description:  70 y.o. Female admitted 02/21/2021 due to Diverticulitis with Colovesical fistula and UTI. On 03/01/21 she developed complicated Diverticulitis with perforation requiring Sigmoid colectomy with end colostomy and Hartman's procedure.  Post procedure she returns to ICU and remains intubated with Septic Shock requiring multiple vasopressors.  History of Present Illness:  Desiree Madden is a 70 y.o. Female with a past medical history as listed below who presented to [facility] ED on 02/21/2021 with severe lower abdominal pain and dysuria. She denied fever, chills, or vomiting.  Of note she had been on and off antibiotics over the past several months for recurrent UTI's related to her Colovesical fistula. She was supposed to have surgery for the fistula in the fall of 2021, but it never occurred.  ED Course: CT Abdomen & Pelvis showed evidence of possible diverticulitis and constipation, could not rule out colonic neoplasm.  She was admitted to the Med-Surg unit by the Hospitalist for further workup and treatment of Diverticulitis and UTI.  GI and General Surgery were consulted.  Hospital Course: Pt elected to try medical alternatives instead of proceeding with surgery.  However on 02/28/21 she had worsening fever and Leukocytosis. Repeat CT Abdomen/Pelvis showed an abscess with likely fistula formation, of which a Percutaneous Drain was placed by IR. Despite drain placement, on 03/01/21 her leukocytosis continued to worsen.  Decision was made to proceed with surgery.  She was found to have complicated Diverticulitis with perforation and gangrene of the sigmoid colon, she required a sigmoid colectomy with end colostomy and Hartman's procedure.  She returns to ICU post procedure with severe  septic shock and remains intubated.  Pertinent  Medical History  Hypertension Hypothyroidism Ulcerative colitis Diverticulitis Colon-Bladder fistula  Significant Hospital Events: Including procedures, antibiotic start and stop dates in addition to other pertinent events    02/21/2021: Admitted to Med/Surg unit with Diverticulitis and UTI  02/28/2021: LLQ abdominal drain placed by IR to pelvic abscess  03/01/2021: Worsening Leukocytosis despite drain placement. Found to have complicated Diverticulitis with perforation requiring Sigmoid Colectomy with end colostomy and Hartman's Procedure.  Post procedure she returns to ICU and remains intubated with Septic Shock requiring multiple vasopressors.  Left Femoral CVC and Arterial lines placed emergently  03/04/2021: Severe anasarca, albumin 1.8 DX of NSTEMI  3/28 failure to wean from vent  3/29 severe agitation, septic shock on pressors    Cultures:  02/21/21: SARS CoV-2 PCR>>negative 02/21/21: Influenza PCR>>negative 02/21/2021: Urine>>ENTEROCOCCUS FAECALIS,  ENTEROCOCCUS FAECIUM(sensitive to vancomycin and ampicillin) 02/22/2021: HIV screen>> nonreactive 02/28/2021: LLQ Abdominal drain>> Enterococcus avium, other polymicrobial  Candida albicans 03/01/2021: MRSA PCR>> negative 03/01/2021: Blood culture x2>>   Antimicrobials:  Ciprofloxacin 3/16>>3/18 Unasyn 3/23>>3/23 Flagyl 3/16>>3/18; 3/24>>3/29 Cefepime 3/24>>3/29 Vancomycin 3/24>>3/29 UNASYN 3/29-->    Interim History / Subjective:  On vent On pressors Remains critically ill gen surgery following +renal failure Increased WBC   CBC    Component Value Date/Time   WBC 19.1 (H) 03/06/2021 0452   RBC 2.79 (L) 03/06/2021 0452   HGB 8.3 (L) 03/06/2021 0452   HCT 24.9 (L) 03/06/2021 0452   PLT 142 (L) 03/06/2021 0452   MCV 89.2 03/06/2021 0452   MCH 29.7 03/06/2021 0452   MCHC 33.3 03/06/2021 0452   RDW 15.4 03/06/2021 0452   LYMPHSABS 0.4 (L) 03/02/2021  0113    MONOABS 0.6 03/02/2021 0113   EOSABS 0.0 03/02/2021 0113   BASOSABS 0.1 03/02/2021 0113   BMP Latest Ref Rng & Units 03/06/2021 03/05/2021 03/05/2021  Glucose 70 - 99 mg/dL 202(H) - 188(H)  BUN 8 - 23 mg/dL 45(H) - 45(H)  Creatinine 0.44 - 1.00 mg/dL 1.18(H) - 1.20(H)  Sodium 135 - 145 mmol/L 146(H) - 143  Potassium 3.5 - 5.1 mmol/L 3.4(L) 3.7 2.9(L)  Chloride 98 - 111 mmol/L 112(H) - 108  CO2 22 - 32 mmol/L 28 - 27  Calcium 8.9 - 10.3 mg/dL 7.8(L) - 7.3(L)      Objective   Blood pressure (!) 86/43, pulse (!) 48, temperature (!) 97.52 F (36.4 C), resp. rate 12, height 5' 4.02" (1.626 m), weight 67.2 kg, SpO2 93 %.    Vent Mode: PRVC FiO2 (%):  [35 %-45 %] 35 % Set Rate:  [12 bmp] 12 bmp Vt Set:  [450 mL] 450 mL PEEP:  [5 cmH20] 5 cmH20   Intake/Output Summary (Last 24 hours) at 03/06/2021 0745 Last data filed at 03/06/2021 0600 Gross per 24 hour  Intake 1586.06 ml  Output 3635 ml  Net -2048.94 ml   Filed Weights   02/27/21 0900 02/28/21 1014 03/01/21 1511  Weight: 66.9 kg 67.2 kg 67.2 kg   REVIEW OF SYSTEMS  PATIENT IS UNABLE TO PROVIDE COMPLETE REVIEW OF SYSTEM S DUE TO SEVERE CRITICAL ILLNESS AND ENCEPHALOPATHY  PHYSICAL EXAMINATION:  GENERAL:critically ill appearing, +resp distress HEAD: Normocephalic, atraumatic.  EYES: Pupils equal, round, reactive to light.  No scleral icterus.  MOUTH: Moist mucosal membrane. NECK: Supple. No thyromegaly. No nodules. No JVD.  PULMONARY: +rhonchi, +wheezing CARDIOVASCULAR: S1 and S2. Regular rate and rhythm. No murmurs, rubs, or gallops.  GASTROINTESTINAL: +bandages,+open colostomy  MUSCULOSKELETAL: No swelling, clubbing, or edema.  NEUROLOGIC: obtunded SKIN:intact,warm,dry      Resolved Hospital Problem list   n/a  Assessment & Plan:  70 yo  with acute and severe septic shock with hemorrhagic shock with acute hypoxic resp failure with septic shock and  NSTEMI due to complicated Complicated Diverticulitis/Pelvic  Abscess with Perforation & Gangrene of the Sigmoid Colon, Colovesical Fistula  Severe ACUTE Hypoxic and Hypercapnic Respiratory Failure -continue Mechanical Ventilator support -continue Bronchodilator Therapy -Wean Fio2 and PEEP as tolerated -VAP/VENT bundle implementation -will perform SAT/SBT when respiratory parameters are met  ACUTE DAISTOLIC CARDIAC FAILURE- +NSTEMI -oxygen as needed -Lasix as tolerated -follow up cardiac enzymes as indicated -follow up cardiology recs left ventricle demonstrates  regional wall motion abnormalities (Concern for basal to mid inferior wall  hypokinesis). Left ventricular   ACUTE KIDNEY INJURY/Renal Failure -continue Foley Catheter-assess need -Avoid nephrotoxic agents -Follow urine output, BMP -Ensure adequate renal perfusion, optimize oxygenation -Renal dose medications   INFECTIOUS DISEASE -continue antibiotics as prescribed -follow up cultures -follow up ID consultation Anti-infectives (From admission, onward)   Start     Dose/Rate Route Frequency Ordered Stop   03/04/21 1800  vancomycin (VANCOREADY) IVPB 750 mg/150 mL        750 mg 150 mL/hr over 60 Minutes Intravenous Every 24 hours 03/03/21 1442     03/03/21 0800  vancomycin (VANCOREADY) IVPB 1500 mg/300 mL  Status:  Discontinued        1,500 mg 150 mL/hr over 120 Minutes Intravenous Every 48 hours 03/02/21 1410 03/03/21 1442   03/02/21 0800  vancomycin (VANCOREADY) IVPB 750 mg/150 mL  Status:  Discontinued        750 mg 150 mL/hr over  60 Minutes Intravenous Every 24 hours 03/01/21 1954 03/02/21 1409   03/01/21 2200  ceFEPIme (MAXIPIME) 2 g in sodium chloride 0.9 % 100 mL IVPB        2 g 200 mL/hr over 30 Minutes Intravenous Every 12 hours 03/01/21 1903     03/01/21 2100  vancomycin (VANCOREADY) IVPB 1250 mg/250 mL        1,250 mg 166.7 mL/hr over 90 Minutes Intravenous  Once 03/01/21 1954 03/02/21 0008   03/01/21 2000  metroNIDAZOLE (FLAGYL) IVPB 500 mg        500 mg 100  mL/hr over 60 Minutes Intravenous Every 8 hours 03/01/21 1903     02/28/21 1800  piperacillin-tazobactam (ZOSYN) IVPB 3.375 g  Status:  Discontinued        3.375 g 12.5 mL/hr over 240 Minutes Intravenous Every 8 hours 02/28/21 1643 03/01/21 1903   02/28/21 1730  piperacillin-tazobactam (ZOSYN) IVPB 3.375 g  Status:  Discontinued        3.375 g 100 mL/hr over 30 Minutes Intravenous Every 8 hours 02/28/21 1635 02/28/21 1642   02/28/21 1300  Ampicillin-Sulbactam (UNASYN) 3 g in sodium chloride 0.9 % 100 mL IVPB  Status:  Discontinued        3 g 200 mL/hr over 30 Minutes Intravenous Every 6 hours 02/28/21 1156 02/28/21 1635   02/23/21 1230  piperacillin-tazobactam (ZOSYN) IVPB 3.375 g  Status:  Discontinued        3.375 g 12.5 mL/hr over 240 Minutes Intravenous Every 8 hours 02/23/21 1143 02/28/21 1155   02/22/21 0600  ciprofloxacin (CIPRO) IVPB 400 mg  Status:  Discontinued        400 mg 200 mL/hr over 60 Minutes Intravenous Every 12 hours 02/21/21 2028 02/23/21 1143   02/22/21 0400  metroNIDAZOLE (FLAGYL) IVPB 500 mg  Status:  Discontinued        500 mg 100 mL/hr over 60 Minutes Intravenous Every 8 hours 02/21/21 2028 02/23/21 1143   02/21/21 1830  ciprofloxacin (CIPRO) IVPB 400 mg        400 mg 200 mL/hr over 60 Minutes Intravenous  Once 02/21/21 1820 02/21/21 1939   02/21/21 1830  metroNIDAZOLE (FLAGYL) IVPB 500 mg        500 mg 100 mL/hr over 60 Minutes Intravenous  Once 02/21/21 1820 02/21/21 2110     Septic shock -use vasopressors to keep MAP>65 on NEO -follow ABG and LA -follow up cultures -emperic ABX -consider stress dose steroids +CANDIDA ALBICANS +ENTEROCOCCUS AVIUM +CLOSTRIDIUM +BACTEROIDES    GI GI PROPHYLAXIS as indicated  NUTRITIONAL STATUS DIET-->follow up gen surgery recs Constipation protocol as indicated  ENDO - ICU hypoglycemic\Hyperglycemia protocol -check FSBS per protocol  ELECTROLYTES -follow labs as needed -replace as needed -pharmacy  consultation and following  ACUTE ANEMIA- TRANSFUSE AS NEEDED DVT PRX with TED/SCD's ONLY TRANSFUSE if HGB<7   INFECTIOUS DISEASE -continue antibiotics as prescribed -follow up cultures -follow up ID consultation       Best practice (evaluated daily)  Diet: npo Pain/Anxiety/Delirium protocol VAP protocol  DVT prophylaxis: lmwh GI prophylaxis: ppi Disposition:icu   Labs   CBC: Recent Labs  Lab 03/01/21 2022 03/02/21 0113 03/02/21 1812 03/03/21 0510 03/04/21 0454 03/05/21 0644 03/05/21 1535 03/05/21 2054 03/06/21 0452  WBC 27.1* 32.5* 40.6* 36.3* 23.5* 17.7*  --   --  19.1*  NEUTROABS 24.5* 30.7*  --   --   --   --   --   --   --   HGB 6.1*  9.8* 9.7* 9.4* 9.5* 6.6* 7.7* 8.4* 8.3*  HCT 18.7* 29.4* 28.3* 27.2* 27.4* 19.5* 22.6* 24.6* 24.9*  MCV 89.9 89.1 86.3 85.5 86.4 89.4  --   --  89.2  PLT 237 187 184 147* 142* 150  --   --  142*    Basic Metabolic Panel: Recent Labs  Lab 03/02/21 0113 03/02/21 0344 03/02/21 1812 03/03/21 0200 03/03/21 0510 03/03/21 1644 03/04/21 0454 03/05/21 0500 03/05/21 1535 03/06/21 0452  NA  --    < > 139  --  138  --  137 143  --  146*  K  --    < > 2.8*   < > 3.1* 3.3* 3.5 2.9* 3.7 3.4*  CL  --    < > 101  --  101  --  101 108  --  112*  CO2  --    < > 23  --  28  --  27 27  --  28  GLUCOSE  --    < > 175*  --  146*  --  165* 188*  --  202*  BUN  --    < > 36*  --  34*  --  43* 45*  --  45*  CREATININE  --    < > 1.43*  --  1.30*  --  1.34* 1.20*  --  1.18*  CALCIUM  --    < > 6.9*  --  6.7*  --  7.5* 7.3*  --  7.8*  MG 1.8  --  1.9  --  2.4 2.2 2.3 1.9  --  2.3  PHOS 8.1*  --   --   --  4.4  --  3.7 2.9  --  3.2   < > = values in this interval not displayed.   GFR: Estimated Creatinine Clearance: 41.8 mL/min (A) (by C-G formula based on SCr of 1.18 mg/dL (H)). Recent Labs  Lab 03/01/21 2022 03/01/21 2154 03/02/21 0113 03/02/21 0333 03/02/21 0610 03/02/21 1812 03/03/21 0510 03/04/21 0454 03/05/21 0644  03/05/21 1105 03/06/21 0452  PROCALCITON 7.56  --  20.51  --   --   --  26.93  --   --   --   --   WBC 27.1*  --  32.5*  --   --  40.6* 36.3* 23.5* 17.7*  --  19.1*  LATICACIDVEN 7.0*   < >  --  2.6* 1.9 1.6  --   --   --  1.1  --    < > = values in this interval not displayed.    Liver Function Tests: Recent Labs  Lab 03/01/21 2022 03/02/21 0344 03/04/21 0454 03/05/21 0500 03/06/21 0452  AST 34 62*  --   --   --   ALT 12 16  --   --   --   ALKPHOS 55 58  --   --   --   BILITOT 0.7 1.3*  --   --   --   PROT 3.7* 4.6*  --   --   --   ALBUMIN 1.7* 2.5* 1.8* 2.3* 2.9*   No results for input(s): LIPASE, AMYLASE in the last 168 hours. No results for input(s): AMMONIA in the last 168 hours.  ABG    Component Value Date/Time   PHART 7.46 (H) 03/05/2021 1021   PCO2ART 36 03/05/2021 1021   PO2ART 95 03/05/2021 1021   HCO3 25.6 03/05/2021 1021   ACIDBASEDEF 4.3 (H) 03/02/2021 0800  O2SAT 97.8 03/05/2021 1021     Coagulation Profile: Recent Labs  Lab 03/01/21 2022 03/02/21 0333  INR 1.7* 1.5*    Cardiac Enzymes: No results for input(s): CKTOTAL, CKMB, CKMBINDEX, TROPONINI in the last 168 hours.  HbA1C: No results found for: HGBA1C  CBG: Recent Labs  Lab 03/05/21 1118 03/05/21 1532 03/05/21 1950 03/05/21 2350 03/06/21 0401  GLUCAP 152* 169* 193* 178* 178*     Past Medical History:  She,  has a past medical history of Hypertension and Thyroid disease.   Surgical History:   Past Surgical History:  Procedure Laterality Date  . CARDIAC SURGERY    . COLECTOMY WITH COLOSTOMY CREATION/HARTMANN PROCEDURE Left 03/01/2021   Procedure: COLECTOMY WITH COLOSTOMY CREATION/HARTMANN PROCEDURE;  Surgeon: Ronny Bacon, MD;  Location: [facility] ORS;  Service: General;  Laterality: Left;     Social History:   reports that she has quit smoking. She has never used smokeless tobacco. She reports that she does not drink alcohol and does not use drugs.   Family History:  Her  family history includes Arthritis in an other family member; CAD in her mother.   Allergies Allergies  Allergen Reactions  . Baclofen     Other reaction(s): Headache  . Sertraline     Other reaction(s): Other (See Comments), Other (See Comments) Muscle aches Muscle aches   . Sulfa Antibiotics     Other reaction(s): Other (See Comments), Other (See Comments) Pt was told not to take it due to colitis Pt was told not to take it due to colitis   . Ibuprofen Nausea And Vomiting  . Trazodone And Nefazodone      Home Medications  Prior to Admission medications   Medication Sig Start Date End Date Taking? Authorizing Provider  alendronate (FOSAMAX) 70 MG tablet Take 70 mg by mouth once a week. Take with a full glass of water on an empty stomach.   Yes [provider]  aspirin EC 81 MG tablet Take 81 mg by mouth daily.   Yes [provider]  atorvastatin (LIPITOR) 20 MG tablet Take 20 mg by mouth daily.   Yes [provider]  diclofenac Sodium (VOLTAREN) 1 % GEL Apply topically 4 (four) times daily.   Yes [provider]  fluticasone (FLONASE) 50 MCG/ACT nasal spray Place 2 sprays into both nostrils daily.   Yes [provider]  gabapentin (NEURONTIN) 300 MG capsule Take 300 mg by mouth 3 (three) times daily.   Yes [provider]  levothyroxine (SYNTHROID) 75 MCG tablet Take 75 mcg by mouth daily before breakfast.   Yes [provider]  meloxicam (MOBIC) 15 MG tablet Take 15 mg by mouth daily.   Yes [provider]  metoprolol succinate (TOPROL-XL) 50 MG 24 hr tablet Take 50 mg by mouth daily. 11/23/20  Yes [provider]  oxyCODONE (OXY IR/ROXICODONE) 5 MG immediate release tablet Take 5 mg by mouth every 12 (twelve) hours as needed.   Yes [provider]  traZODone (DESYREL) 100 MG tablet Take 50-100 mg by mouth at bedtime. Take 1/2 tablet by mouth each evening for sleep, can increase to 136m whole  tablet after 1st week.   Yes [provider]     DVT/GI PRX ordered and assessed TRANSFUSIONS AS NEEDED MONITOR FSBS I Assessed the need for Labs I Assessed the need for Foley I Assessed the need for Central Venous Line Family Discussion when available I Assessed the need for Mobilization I made an Assessment  of medications to be adjusted accordingly Safety Risk assessment completed  CASE DISCUSSED IN MULTIDISCIPLINARY ROUNDS WITH ICU TEAM      Critical Care Time devoted to patient care services described in this note is 55 minutes.   Overall, patient is critically ill, prognosis is guarded.  Patient with Multiorgan failure and at high risk for cardiac arrest and death.    Corrin Parker, M.D.  [facility]  Medical Director [place] Director [facility] Cardio-Pulmonary Department

## 2021-03-06 NOTE — Consult Note (Signed)
NAME: Desiree Madden  DOB: 1951-09-21  MRN: [id number]  Date/Time: 03/06/2021 11:03 AM  REQUESTING PROVIDER: Dr. Mortimer Fries Subjective:  REASON FOR CONSULT:  ?No history available from patient.  Chart reviewed. Desiree Madden is a 70 y.o. female with history of colovesicle fistula, diverticulitis, hypertension, hypothyroidism, ulcerative colitis, hyperlipidemia presented to [facility] on 02/21/2021 with lower abdominal pain. Labs showed WBC of 17.9, Hb 11, platelet 267, creatinine 1.40, LFTs normal, blood glucose 158 and lactate 3.  Blood cultures were sent.  She had CT abdomen in the ED which showed advanced colonic diverticulosis with pericolonic fat stranding about the proximal sigmoid colon.  This fat stranding was extending to the adjacent urinary bladder. Fluid level in the urinary bladder.  This may be concerning for fistula with pericolonic edema extending to the anterior aspect of the bladder. Large volume colonic stool from the splenic flexure distally was seen suggesting constipation.  There was also chronic but progressive moderately severe T11 compression fracture.  She was diagnosed with diverticulitis and put on Cipro and Flagyl and bowel rest.  She was seen by GI who did not recommend colonoscopy.  Was seen by surgery And because of the constipation which was aggravating the diverticulitis it was recommended to relieve the constipation and continue treating with IV Zosyn before any surgical intervention could be done.  Urine culture on admission had Enterococcus faecalis.  Patient had another CT abdomen on 3/19 and then 3/23 and the latter showed a new pericolonic abscess with possible intramural component but definite extramural component in the left pelvic measuring 8.3 to 4.7 cm.  On 02/28/2021 patient underwent image guided drain placement in the left lower quadrant.  He had multiple organisms from the stool which included Enterococcus avium, Candida albicans, Clostridium, Bacteroides.  She  continued on Zosyn Because of persistent leukocytosis in spite of drain placement she was taken for surgery on 03/01/2021.  Had laparotomy and found to have massive edema and chronically scarred sigmoid colon extending from mid descending colon with redundancy in the right pelvis.  There was gangrenous changes with perforation and feculent peritonitis with thick peel and biofilm of the left lower quadrant.  She underwent sigmoid colectomy with end colostomy, Hartman's procedure.  Splenic flexure mobilization was done. She also underwent colovesicular fistula repair by Dr. Caryl Bis who noted the patient had diverticulitis and pelvic abscess.  The fistula in the dome of the bladder was repaired in 2 layers.  Postop she returned to ICU with severe septic shock and remained intubated.  Started on pressors and also had left femoral central vein catheter and arterial line was placed emergently. She was started on cefepime, metronidazole and vancomycin.  Also had non-STEMI by troponins and was seen by cardiology. Failure to wean off the vent on 03/05/2021. On 03/06/2021 she had severe agitation and still continued on pressors.  I am asked to see the patient for further management.  Today patient was extubated but post extubation she had severe abdominal pain and increased work of breathing and had to be reintubated.  LDA PICC Triple-lumen placed on 03/02/2021>>03/06/21 Arterial line left femoral placed on 03/01/2021>>03/06/21 Urethral catheter placed on 03/01/2021 Close system drained right lower quadrant placed on 03/01/2021 Airway 03/01/2021 Colostomy 03/01/2021   Past Medical History:  Diagnosis Date  . Hypertension   . Thyroid disease     Past Surgical History:  Procedure Laterality Date  . CARDIAC SURGERY    . COLECTOMY WITH COLOSTOMY CREATION/HARTMANN PROCEDURE Left 03/01/2021   Procedure: COLECTOMY WITH COLOSTOMY CREATION/HARTMANN  PROCEDURE;  Surgeon: Ronny Bacon, MD;  Location: [facility] ORS;   Service: General;  Laterality: Left;    Social History   Socioeconomic History  . Marital status: Married    Spouse name: Not on file  . Number of children: Not on file  . Years of education: Not on file  . Highest education level: Not on file  Occupational History  . Not on file  Tobacco Use  . Smoking status: Former Research scientist (life sciences)  . Smokeless tobacco: Never Used  Vaping Use  . Vaping Use: Never used  Substance and Sexual Activity  . Alcohol use: Never  . Drug use: Never  . Sexual activity: Not on file  Other Topics Concern  . Not on file  Social History Narrative  . Not on file   Social Determinants of Health   Financial Resource Strain: Not on file  Food Insecurity: Not on file  Transportation Needs: Not on file  Physical Activity: Not on file  Stress: Not on file  Social Connections: Not on file  Intimate Partner Violence: Not on file    Family History  Problem Relation Age of Onset  . Arthritis Other   . CAD Mother    Allergies  Allergen Reactions  . Baclofen     Other reaction(s): Headache  . Sertraline     Other reaction(s): Other (See Comments), Other (See Comments) Muscle aches Muscle aches   . Sulfa Antibiotics     Other reaction(s): Other (See Comments), Other (See Comments) Pt was told not to take it due to colitis Pt was told not to take it due to colitis   . Ibuprofen Nausea And Vomiting  . Trazodone And Nefazodone    I? Current Facility-Administered Medications  Medication Dose Route Frequency Provider Last Rate Last Admin  . 0.9 %  sodium chloride infusion  250 mL Intravenous Continuous Nelle Don, MD   Stopped at 03/02/21 1553  . acetaminophen (TYLENOL) tablet 650 mg  650 mg Per Tube Q6H PRN [name], RPH       Or  . acetaminophen (TYLENOL) suppository 650 mg  650 mg Rectal Q6H PRN Belue, Alver Sorrow, RPH      . albumin human 25 % solution 25 g  25 g Intravenous BID [name], MD 60 mL/hr at 03/06/21 1045 25 g at 03/06/21  1045  . atorvastatin (LIPITOR) tablet 20 mg  20 mg Per Tube Daily [name], RPH   20 mg at 03/06/21 1050  . ceFEPIme (MAXIPIME) 2 g in sodium chloride 0.9 % 100 mL IVPB  2 g Intravenous Q12H Nelle Don, MD   Stopped at 03/05/21 2319  . chlorhexidine gluconate (MEDLINE KIT) (PERIDEX) 0.12 % solution 15 mL  15 mL Mouth Rinse BID Nelle Don, MD   15 mL at 03/06/21 0830  . Chlorhexidine Gluconate Cloth 2 % PADS 6 each  6 each Topical Daily [name], MD   6 each at 03/06/21 0547  . dexmedetomidine (PRECEDEX) 400 MCG/100ML (4 mcg/mL) infusion  0.4-1.2 mcg/kg/hr Intravenous Titrated [name], NP 10.08 mL/hr at 03/06/21 0800 0.6 mcg/kg/hr at 03/06/21 0800  . docusate (COLACE) 50 MG/5ML liquid 100 mg  100 mg Per Tube BID [name], NP   100 mg at 03/06/21 1050  . feeding supplement (VITAL HIGH PROTEIN) liquid 1,000 mL  1,000 mL Per Tube Continuous Pabon, Diego F, MD 45 mL/hr at 03/05/21 1547 1,000 mL at 03/05/21 1547  . fentaNYL (SUBLIMAZE) injection 25  mcg  25 mcg Intravenous Q15 min PRN [name], NP   25 mcg at 03/05/21 [phone]  . fentaNYL (SUBLIMAZE) injection 25-100 mcg  25-100 mcg Intravenous Q30 min PRN [name], NP   100 mcg at 03/06/21 0934  . fentaNYL 2531mg in NS 2569m(1063mml) infusion-PREMIX  0-400 mcg/hr Intravenous Continuous [name] 25 mL/hr at 03/06/21 0800 250 mcg/hr at 03/06/21 0800  . free water 100 mL  100 mL Per Tube Q4H [name]   100 mL at 03/06/21 1100  . HYDROcodone-acetaminophen (NORCO/VICODIN) 5-325 MG per tablet 1 tablet  1 tablet Per Tube Q4H PRN BelRenda RollsPH      . hydrocortisone sodium succinate (SOLU-CORTEF) 100 MG injection 50 mg  50 mg Intravenous Q6H [name]   50 mg at 03/06/21 0900  . HYDROmorphone (DILAUDID) injection 0.5 mg  0.5 mg Intravenous Q3H PRN [name]   0.5 mg at 03/06/21 0626  . insulin aspart (novoLOG) injection 0-15 Units  0-15 Units Subcutaneous Q4H  [name]   3 Units at 03/06/21 [phone] ipratropium-albuterol (DUONEB) 0.5-2.5 (3) MG/3ML nebulizer solution 3 mL  3 mL Nebulization Q4H PRN [name]      . levothyroxine (SYNTHROID) tablet 75 mcg  75 mcg Per Tube Q0600 BelRenda RollsPH   75 mcg at 03/06/21 0550  . MEDLINE mouth rinse  15 mL Mouth Rinse 10 times per day [name]   15 mL at 03/06/21 0955  . metroNIDAZOLE (FLAGYL) IVPB 500 mg  500 mg Intravenous Q8H [name]   Stopped at 03/06/21 [phone] midazolam (VERSED) injection 1-2 mg  1-2 mg Intravenous Q2H PRN [name], Britton L, NP   2 mg at 03/06/21 0959  . midodrine (PROAMATINE) tablet 10 mg  10 mg Per Tube TID WC [name]      . multivitamin with minerals tablet 1 tablet  1 tablet Per Tube Daily BelRenda RollsPH   1 tablet at 03/06/21 1050  . ondansetron (ZOFRAN) tablet 4 mg  4 mg Per Tube Q6H PRN BelRenda RollsPH       Or  . ondansetron (ZOFRAN) injection 4 mg  4 mg Intravenous Q6H PRN BelRenda RollsPH      . pantoprazole (PROTONIX) injection 40 mg  40 mg Intravenous Daily [name]   40 mg at 03/06/21 1050  . phenylephrine CONCENTRATED 100m27m sodium chloride 0.9% 250mL22m4mg/m61minfusion  0-400 mcg/min Intravenous Titrated Keene,[name] 4.5 mL/hr at 03/06/21 0800 30 mcg/min at 03/06/21 0800  . polyethylene glycol (MIRALAX / GLYCOLAX) packet 17 g  17 g Per Tube Daily Keene,[name]   17 g at 03/06/21 1050  . sodium chloride flush (NS) 0.9 % injection 10-40 mL  10-40 mL Intracatheter Q12H BresciNelle Don 10 mL at 03/06/21 1100  . sodium chloride flush (NS) 0.9 % injection 10-40 mL  10-40 mL Intracatheter PRN BresciNelle Don    . sodium chloride flush (NS) 0.9 % injection 5 mL  5 mL Intracatheter Q8H WagnerCorrie Mckusick 5 mL at 03/06/21 0548  . vancomycin (VANCOREADY) IVPB 750 mg/150 mL  750 mg Intravenous Q24H Grubb,Dallie Piles  [facility] at 03/05/21 1846     Abtx:   Anti-infectives (From admission, onward)   Start  Dose/Rate Route Frequency Ordered Stop   03/04/21 1800  vancomycin (VANCOREADY) IVPB 750 mg/150 mL        750 mg 150 mL/hr over 60 Minutes Intravenous Every 24 hours 03/03/21 1442     03/03/21 0800  vancomycin (VANCOREADY) IVPB 1500 mg/300 mL  Status:  Discontinued        1,500 mg 150 mL/hr over 120 Minutes Intravenous Every 48 hours 03/02/21 1410 03/03/21 1442   03/02/21 0800  vancomycin (VANCOREADY) IVPB 750 mg/150 mL  Status:  Discontinued        750 mg 150 mL/hr over 60 Minutes Intravenous Every 24 hours 03/01/21 1954 03/02/21 1409   03/01/21 2200  ceFEPIme (MAXIPIME) 2 g in sodium chloride 0.9 % 100 mL IVPB        2 g 200 mL/hr over 30 Minutes Intravenous Every 12 hours 03/01/21 1903     03/01/21 2100  vancomycin (VANCOREADY) IVPB 1250 mg/250 mL        1,250 mg 166.7 mL/hr over 90 Minutes Intravenous  Once 03/01/21 1954 03/02/21 0008   03/01/21 2000  metroNIDAZOLE (FLAGYL) IVPB 500 mg        500 mg 100 mL/hr over 60 Minutes Intravenous Every 8 hours 03/01/21 1903     02/28/21 1800  piperacillin-tazobactam (ZOSYN) IVPB 3.375 g  Status:  Discontinued        3.375 g 12.5 mL/hr over 240 Minutes Intravenous Every 8 hours 02/28/21 1643 03/01/21 1903   02/28/21 1730  piperacillin-tazobactam (ZOSYN) IVPB 3.375 g  Status:  Discontinued        3.375 g 100 mL/hr over 30 Minutes Intravenous Every 8 hours 02/28/21 1635 02/28/21 1642   02/28/21 1300  Ampicillin-Sulbactam (UNASYN) 3 g in sodium chloride 0.9 % 100 mL IVPB  Status:  Discontinued        3 g 200 mL/hr over 30 Minutes Intravenous Every 6 hours 02/28/21 1156 02/28/21 1635   02/23/21 1230  piperacillin-tazobactam (ZOSYN) IVPB 3.375 g  Status:  Discontinued        3.375 g 12.5 mL/hr over 240 Minutes Intravenous Every 8 hours 02/23/21 1143 02/28/21 1155   02/22/21 0600  ciprofloxacin (CIPRO) IVPB 400 mg  Status:  Discontinued        400 mg 200 mL/hr over 60 Minutes Intravenous  Every 12 hours 02/21/21 2028 02/23/21 1143   02/22/21 0400  metroNIDAZOLE (FLAGYL) IVPB 500 mg  Status:  Discontinued        500 mg 100 mL/hr over 60 Minutes Intravenous Every 8 hours 02/21/21 2028 02/23/21 1143   02/21/21 1830  ciprofloxacin (CIPRO) IVPB 400 mg        400 mg 200 mL/hr over 60 Minutes Intravenous  Once 02/21/21 1820 02/21/21 1939   02/21/21 1830  metroNIDAZOLE (FLAGYL) IVPB 500 mg        500 mg 100 mL/hr over 60 Minutes Intravenous  Once 02/21/21 1820 02/21/21 2110      REVIEW OF SYSTEMS:  NA: Objective:  VITALS:  BP (!) 86/43 (BP Location: Left Arm)   Pulse (!) 47   Temp 97.7 F (36.5 C)   Resp 12   Ht 5' 4.02" (1.626 m)   Wt 67.2 kg   SpO2 92%   BMI 25.42 kg/m  PHYSICAL EXAM:  General: intubated , sedated, not currently on pressors Head: Normocephalic, without obvious abnormality, atraumatic. Eyes: Conjunctivae clear, anicteric sclerae. Pupils are equal ENT cannot be examined Lungs: b/l air entry Heart: Tachycardia Abdomen: Soft,colsotomy, rt llq drain-site  leaking foley Extremities: edema ankles Skin: No rashes or lesions. Or bruising Lymph: Cervical, supraclavicular normal. Neurologic: cannot assess Pertinent Labs Lab Results CBC       Component Value Date/Time   WBC 19.1 (H) 03/06/2021 0452   RBC 2.79 (L) 03/06/2021 0452   HGB 8.3 (L) 03/06/2021 0452   HCT 24.9 (L) 03/06/2021 0452   PLT 142 (L) 03/06/2021 0452   MCV 89.2 03/06/2021 0452   MCH 29.7 03/06/2021 0452   MCHC 33.3 03/06/2021 0452   RDW 15.4 03/06/2021 0452   LYMPHSABS 0.4 (L) 03/02/2021 0113   MONOABS 0.6 03/02/2021 0113   EOSABS 0.0 03/02/2021 0113   BASOSABS 0.1 03/02/2021 0113    CMP Latest Ref Rng & Units 03/06/2021 03/05/2021 03/05/2021  Glucose 70 - 99 mg/dL 202(H) - 188(H)  BUN 8 - 23 mg/dL 45(H) - 45(H)  Creatinine 0.44 - 1.00 mg/dL 1.18(H) - 1.20(H)  Sodium 135 - 145 mmol/L 146(H) - 143  Potassium 3.5 - 5.1 mmol/L 3.4(L) 3.7 2.9(L)  Chloride 98 - 111 mmol/L  112(H) - 108  CO2 22 - 32 mmol/L 28 - 27  Calcium 8.9 - 10.3 mg/dL 7.8(L) - 7.3(L)  Total Protein 6.5 - 8.1 g/dL - - -  Total Bilirubin 0.3 - 1.2 mg/dL - - -  Alkaline Phos 38 - 126 U/L - - -  AST 15 - 41 U/L - - -  ALT 0 - 44 U/L - - -      Microbiology: Recent Results (from the past 240 hour(s))  Aerobic/Anaerobic Culture (surgical/deep wound)     Status: None   Collection Time: 02/28/21  2:49 PM   Specimen: Abdomen; Abscess  Result Value Ref Range Status   Specimen Description   Final    ABDOMEN Performed at [facility], [address], [place], [place] [address]    Special Requests   Final    NONE Performed at [facility], [address] [place], [place] [address]    Gram Stain   Final    RARE WBC PRESENT, PREDOMINANTLY PMN MODERATE GRAM POSITIVE COCCI FEW GRAM POSITIVE RODS FEW GRAM VARIABLE ROD    Culture   Final    FEW ENTEROCOCCUS AVIUM RARE CANDIDA ALBICANS FEW CLOSTRIDIUM INNOCUUM FEW BACTEROIDES THETAIOTAOMICRON BETA LACTAMASE POSITIVE Performed at [facility], [address] [place], [place] [address]    Report Status 03/04/2021 FINAL  Final   Organism ID, Bacteria ENTEROCOCCUS AVIUM  Final      Susceptibility   Enterococcus avium - MIC*    AMPICILLIN <=2 SENSITIVE Sensitive     VANCOMYCIN <=0.5 SENSITIVE Sensitive     GENTAMICIN SYNERGY SENSITIVE Sensitive     * FEW ENTEROCOCCUS AVIUM  Surgical pcr screen     Status: None   Collection Time: 03/01/21 10:21 AM   Specimen: Nasal Mucosa; Nasal Swab  Result Value Ref Range Status   MRSA, PCR NEGATIVE NEGATIVE Final   Staphylococcus aureus NEGATIVE NEGATIVE Final    Comment: (NOTE) The Xpert SA Assay (FDA approved for NASAL specimens in patients 34 years of age and older), is one component of a comprehensive surveillance program. It is not intended to diagnose infection nor to guide or monitor treatment. Performed at [facility], [address]  [place], [place] [address]   MRSA PCR Screening     Status: None   Collection Time: 03/01/21  8:00 PM   Specimen: Nasal Mucosa; Nasopharyngeal  Result Value Ref Range Status  MRSA by PCR NEGATIVE NEGATIVE Final    Comment:        The GeneXpert MRSA Assay (FDA approved for NASAL specimens only), is one component of a comprehensive MRSA colonization surveillance program. It is not intended to diagnose MRSA infection nor to guide or monitor treatment for MRSA infections. Performed at [facility], [address] [place], [place] [address]   CULTURE, BLOOD (ROUTINE X 2) w Reflex to ID Panel     Status: None   Collection Time: 03/01/21  9:50 PM   Specimen: BLOOD  Result Value Ref Range Status   Specimen Description BLOOD BLOOD LEFT HAND  Final   Special Requests   Final    BOTTLES DRAWN AEROBIC AND ANAEROBIC Blood Culture adequate volume   Culture   Final    NO GROWTH 5 DAYS Performed at [facility], [address] [place], [place] [address]    Report Status 03/06/2021 FINAL  Final  CULTURE, BLOOD (ROUTINE X 2) w Reflex to ID Panel     Status: None   Collection Time: 03/01/21  9:54 PM   Specimen: BLOOD  Result Value Ref Range Status   Specimen Description BLOOD RIGHT ANTECUBITAL  Final   Special Requests   Final    BOTTLES DRAWN AEROBIC AND ANAEROBIC Blood Culture adequate volume   Culture   Final    NO GROWTH 5 DAYS Performed at [facility], [address] [place], [place] [address]    Report Status 03/06/2021 FINAL  Final    IMAGING RESULTS: Serial CT abdomens reviewed I have personally reviewed the films ? Impression/Recommendation ?Acute on chronic diverticulitis, colovesical fistula, pelvic abscess, status post drain placement.  Culture polymicrobial from the stool including Enterococcus, Candida, Clostridium, and Bacteroides.  But with worsening leukocytosis went for laparotomy on 03/01/2021 Found to have gangrenous colon, fecal  peritonitis, underwent Hartman's procedure colostomy and bladder fistula closure. Currently on Vanco cefepime and Flagyl change to meropenem and add fluconazole for Candida. Femoral lines have been DC  ? Septic shock due to the above was on pressors- not anymore  Acute hypoxic respiratory failure remains intubated.  Failed extubation today and had to be reintubated  Anemia  CKD ? ___________________________________________________ Discussed with her nurse Note:  This document was prepared using Dragon voice recognition software and may include unintentional dictation errors.

## 2021-03-06 NOTE — Progress Notes (Signed)
Pt was suctioned prior to extubation for no secretions. Per Dr. Zoila Shutter order, she was extubated and placed on a 2L nasal cannula. No stridor noted.

## 2021-03-06 NOTE — Progress Notes (Signed)
After patient was extubated, patient had increased ABD PAIN and increased WOB, patient with severe resp distress with severe hypoxia, patient was emergently;y intubated and placed back on vent.  I updated [name] and relayed information.  Patient with severe respiratory failure and will likely need trach.   Additional Critical Care Time devoted to patient care services described in this note is 35 minutes.    Corrin Parker, M.D.  [facility]  Medical Director [place] Director [facility] Cardio-Pulmonary Department

## 2021-03-06 NOTE — Progress Notes (Signed)
[place] Hospital Day(s): [place] day(s): 5 Days Post-Op.   Interval History:  Patient seen and examined; remains intubated Weaning off vasopressor support; Phenylephrine nearly off.  No acute events or new complaints overnight.  Her leukocytosis trend discontinued...  Hgb up to 8.3; following transfusion  Renal function is stable, UO - 2.2L Hypokalemia improved Surgical drain with 540 ccs out; serosanguinous Continues on cefepime, flagyl, vancomycin Colostomy with output; this was opened.  Planning eventual excision of redundant colon, likely next week.  Will plan to RTOR for this.  Advancing tube feedings now at 45 ml/H  Vital signs in last 24 hours: [min-max] current  Temp:  [95.18 F (35.1 C)-98.06 F (36.7 C)] 97.52 F (36.4 C) (03/29 0100) Pulse Rate:  [38-135] 48 (03/28 1900) Resp:  [11-20] 12 (03/29 0100) BP: (86)/(43) 86/43 (03/28 2000) SpO2:  [93 %-100 %] 93 % (03/28 2030) Arterial Line BP: (83-143)/(40-78) 132/64 (03/29 0100) FiO2 (%):  [35 %-45 %] 35 % (03/29 0745)     Height: 5' 4.02" (162.6 cm) Weight: 67.2 kg BMI (Calculated): 25.42   Intake/Output last 2 shifts:  03/28 0701 - 03/29 0700 In: 1586.1 [I.V.:357.3; Blood:394; NG/GT:527.2; IV Piggyback:307.6] Out: 0940 [Urine:2240; Drains:540; Stool:855]   Physical Exam:  Constitutional: Intubated, opens eyes Respiratory: On ventilator Cardiovascular: regular rate and sinus rhythm  Gastrointestinal: soft, unable to assess tenderness, non-distended. Colostomy in the LUQ, now open/patent, significant redundancy, liquid stool/SS drainage in bag. Surgical drain in RLQ with serosanguinous output Genitourinary: Foley in palce Integumentary: Laparotomy incision is CDI with staples, I removed the remaining exposed wick, serosanguinous drainage, no erythema.  Some of the incision is covered by the colostomy appliance.   Labs:  CBC Latest Ref Rng & Units 03/06/2021  03/05/2021 03/05/2021  WBC 4.0 - 10.5 K/uL 19.1(H) - -  Hemoglobin 12.0 - 15.0 g/dL 8.3(L) 8.4(L) 7.7(L)  Hematocrit 36.0 - 46.0 % 24.9(L) 24.6(L) 22.6(L)  Platelets 150 - 400 K/uL 142(L) - -   CMP Latest Ref Rng & Units 03/06/2021 03/05/2021 03/05/2021  Glucose 70 - 99 mg/dL 202(H) - 188(H)  BUN 8 - 23 mg/dL 45(H) - 45(H)  Creatinine 0.44 - 1.00 mg/dL 1.18(H) - 1.20(H)  Sodium 135 - 145 mmol/L 146(H) - 143  Potassium 3.5 - 5.1 mmol/L 3.4(L) 3.7 2.9(L)  Chloride 98 - 111 mmol/L 112(H) - 108  CO2 22 - 32 mmol/L 28 - 27  Calcium 8.9 - 10.3 mg/dL 7.8(L) - 7.3(L)  Total Protein 6.5 - 8.1 g/dL - - -  Total Bilirubin 0.3 - 1.2 mg/dL - - -  Alkaline Phos 38 - 126 U/L - - -  AST 15 - 41 U/L - - -  ALT 0 - 44 U/L - - -     Imaging studies: No new pertinent imaging studies   Assessment/Plan:  70 y.o. critically-ill female weaning from vasopressor support 5 Days Post-Op s/p Hartman's Procedure for complicated diverticulitis with gangrenous changes and perforation with feculent peritonitis.   - Deferring further colostomy resection/maturation to myself, anticipating next week, as long as she progresses.    - Okay to continue tube feedings to goal.   - Appreciate PCCM assistance; vasopressor and ventilator support               - Continue Abx (Vancomycin, cefepime, flagyl)             - Monitor abdominal examination             -  Monitor colostomy output; WOC RN following              - Monitor leukocytosis.             - Maintain foley catheter, for two weeks minimum post op; monitor UO                 - Midline wound care: cover with dry gauze and ABD pads   All of the above findings and recommendations were discussed with the medical team at bedside  -- Ronny Bacon, M.D. [facility] 03/06/2021, 8:58 AM

## 2021-03-07 ENCOUNTER — Encounter: Payer: Self-pay | Admitting: Internal Medicine

## 2021-03-07 DIAGNOSIS — K5732 Diverticulitis of large intestine without perforation or abscess without bleeding: Secondary | ICD-10-CM

## 2021-03-07 DIAGNOSIS — N321 Vesicointestinal fistula: Secondary | ICD-10-CM | POA: Diagnosis not present

## 2021-03-07 DIAGNOSIS — D649 Anemia, unspecified: Secondary | ICD-10-CM | POA: Diagnosis not present

## 2021-03-07 DIAGNOSIS — K5792 Diverticulitis of intestine, part unspecified, without perforation or abscess without bleeding: Secondary | ICD-10-CM | POA: Diagnosis not present

## 2021-03-07 DIAGNOSIS — J9601 Acute respiratory failure with hypoxia: Secondary | ICD-10-CM | POA: Diagnosis not present

## 2021-03-07 DIAGNOSIS — K651 Peritoneal abscess: Secondary | ICD-10-CM | POA: Diagnosis not present

## 2021-03-07 LAB — BASIC METABOLIC PANEL
Anion gap: 6 (ref 5–15)
Anion gap: 7 (ref 5–15)
BUN: 45 mg/dL — ABNORMAL HIGH (ref 8–23)
BUN: 44 mg/dL — ABNORMAL HIGH (ref 8–23)
CO2: 25 mmol/L (ref 22–32)
CO2: 24 mmol/L (ref 22–32)
Calcium: 7.3 mg/dL — ABNORMAL LOW (ref 8.9–10.3)
Calcium: 7.8 mg/dL — ABNORMAL LOW (ref 8.9–10.3)
Chloride: 116 mmol/L — ABNORMAL HIGH (ref 98–111)
Chloride: 117 mmol/L — ABNORMAL HIGH (ref 98–111)
Creatinine, Ser: 0.92 mg/dL (ref 0.44–1.00)
Creatinine, Ser: 0.96 mg/dL (ref 0.44–1.00)
GFR, Estimated: >60 mL/min (ref >60)
GFR, Estimated: >60 mL/min (ref >60)
Glucose, Bld: 153 mg/dL — ABNORMAL HIGH (ref 70–99)
Glucose, Bld: 121 mg/dL — ABNORMAL HIGH (ref 70–99)
Potassium: 2.6 mmol/L — CL (ref 3.5–5.1)
Potassium: 3.1 mmol/L — ABNORMAL LOW (ref 3.5–5.1)
Sodium: 147 mmol/L — ABNORMAL HIGH (ref 135–145)
Sodium: 148 mmol/L — ABNORMAL HIGH (ref 135–145)

## 2021-03-07 LAB — MAGNESIUM
Magnesium: 1.9 mg/dL (ref 1.7–2.4)
Magnesium: 1.7 mg/dL (ref 1.7–2.4)

## 2021-03-07 LAB — COMPREHENSIVE METABOLIC PANEL
ALT: 15 U/L (ref 0–44)
AST: 29 U/L (ref 15–41)
Albumin: 3.1 g/dL — ABNORMAL LOW (ref 3.5–5.0)
Alkaline Phosphatase: 35 U/L — ABNORMAL LOW (ref 38–126)
Anion gap: 9 (ref 5–15)
BUN: 44 mg/dL — ABNORMAL HIGH (ref 8–23)
CO2: 25 mmol/L (ref 22–32)
Calcium: 7.5 mg/dL — ABNORMAL LOW (ref 8.9–10.3)
Chloride: 114 mmol/L — ABNORMAL HIGH (ref 98–111)
Creatinine, Ser: 1.01 mg/dL — ABNORMAL HIGH (ref 0.44–1.00)
GFR, Estimated: 60 mL/min — ABNORMAL LOW (ref >60)
Glucose, Bld: 126 mg/dL — ABNORMAL HIGH (ref 70–99)
Potassium: 3.3 mmol/L — ABNORMAL LOW (ref 3.5–5.1)
Sodium: 148 mmol/L — ABNORMAL HIGH (ref 135–145)
Total Bilirubin: 1.3 mg/dL — ABNORMAL HIGH (ref 0.3–1.2)
Total Protein: 4.7 g/dL — ABNORMAL LOW (ref 6.5–8.1)

## 2021-03-07 LAB — CBC
HCT: 23.4 % — ABNORMAL LOW (ref 36.0–46.0)
Hemoglobin: 7.6 g/dL — ABNORMAL LOW (ref 12.0–15.0)
MCH: 29.7 pg (ref 26.0–34.0)
MCHC: 32.5 g/dL (ref 30.0–36.0)
MCV: 91.4 fL (ref 80.0–100.0)
Platelets: 148 10*3/uL — ABNORMAL LOW (ref 150–400)
RBC: 2.56 MIL/uL — ABNORMAL LOW (ref 3.87–5.11)
RDW: 15.4 % (ref 11.5–15.5)
WBC: 21.9 10*3/uL — ABNORMAL HIGH (ref 4.0–10.5)
nRBC: 0 % (ref 0.0–0.2)

## 2021-03-07 LAB — GLUCOSE, CAPILLARY
Glucose-Capillary: 103 mg/dL — ABNORMAL HIGH (ref 70–99)
Glucose-Capillary: 108 mg/dL — ABNORMAL HIGH (ref 70–99)
Glucose-Capillary: 127 mg/dL — ABNORMAL HIGH (ref 70–99)
Glucose-Capillary: 128 mg/dL — ABNORMAL HIGH (ref 70–99)
Glucose-Capillary: 129 mg/dL — ABNORMAL HIGH (ref 70–99)
Glucose-Capillary: 133 mg/dL — ABNORMAL HIGH (ref 70–99)

## 2021-03-07 LAB — PHOSPHORUS: Phosphorus: 2.6 mg/dL (ref 2.5–4.6)

## 2021-03-07 MED ORDER — MIDAZOLAM 50MG/50ML (1MG/ML) PREMIX INFUSION
0.5000 mg/h | INTRAVENOUS | Status: DC
  Administered 2021-03-07: 2 mg/h via INTRAVENOUS
  Administered 2021-03-07: 5 mg/h via INTRAVENOUS
  Administered 2021-03-08: 3 mg/h via INTRAVENOUS
  Administered 2021-03-08: 4 mg/h via INTRAVENOUS
  Administered 2021-03-09: 3 mg/h via INTRAVENOUS
  Administered 2021-03-09: 4 mg/h via INTRAVENOUS
  Filled 2021-03-07 (×6): qty 50

## 2021-03-07 MED ORDER — SODIUM CHLORIDE 0.9 % IV SOLN
1.0000 g | Freq: Three times a day (TID) | INTRAVENOUS | Status: DC
  Administered 2021-03-07 – 2021-03-08 (×3): 1 g via INTRAVENOUS
  Filled 2021-03-07 (×4): qty 1

## 2021-03-07 MED ORDER — POTASSIUM CHLORIDE 20 MEQ PO PACK
40.0000 meq | PACK | ORAL | Status: AC
  Administered 2021-03-07 (×2): 40 meq
  Filled 2021-03-07 (×2): qty 2

## 2021-03-07 MED ORDER — POTASSIUM CHLORIDE 10 MEQ/100ML IV SOLN
10.0000 meq | INTRAVENOUS | Status: AC
  Administered 2021-03-07 (×6): 10 meq via INTRAVENOUS
  Filled 2021-03-07 (×6): qty 100

## 2021-03-07 MED ORDER — METOLAZONE 2.5 MG PO TABS
2.5000 mg | ORAL_TABLET | Freq: Once | ORAL | Status: AC
  Administered 2021-03-07: 2.5 mg
  Filled 2021-03-07: qty 1

## 2021-03-07 MED ORDER — VITAL HIGH PROTEIN PO LIQD
1000.0000 mL | ORAL | Status: DC
  Administered 2021-03-07: 1000 mL

## 2021-03-07 MED ORDER — POTASSIUM CHLORIDE 10 MEQ/100ML IV SOLN
10.0000 meq | INTRAVENOUS | Status: DC
  Filled 2021-03-07 (×4): qty 100

## 2021-03-07 MED ORDER — FLUCONAZOLE IN SODIUM CHLORIDE 400-0.9 MG/200ML-% IV SOLN
400.0000 mg | INTRAVENOUS | Status: DC
  Administered 2021-03-08: 400 mg via INTRAVENOUS
  Filled 2021-03-07: qty 200

## 2021-03-07 MED ORDER — POTASSIUM CHLORIDE 20 MEQ PO PACK
40.0000 meq | PACK | Freq: Once | ORAL | Status: AC
  Administered 2021-03-07: 40 meq
  Filled 2021-03-07: qty 2

## 2021-03-07 MED ORDER — CALCIUM GLUCONATE-NACL 1-0.675 GM/50ML-% IV SOLN
1.0000 g | Freq: Once | INTRAVENOUS | Status: AC
  Administered 2021-03-07: 1000 mg via INTRAVENOUS
  Filled 2021-03-07: qty 50

## 2021-03-07 MED ORDER — FREE WATER
100.0000 mL | Status: DC
  Administered 2021-03-07 – 2021-03-15 (×46): 100 mL

## 2021-03-07 MED ORDER — FUROSEMIDE 10 MG/ML IJ SOLN
40.0000 mg | Freq: Once | INTRAMUSCULAR | Status: AC
  Administered 2021-03-07: 40 mg via INTRAVENOUS
  Filled 2021-03-07: qty 4

## 2021-03-07 NOTE — Progress Notes (Signed)
[place] for Electrolyte Monitoring and Replacement   Recent Labs: Potassium (mmol/L)  Date Value  03/07/2021 3.3 (L)   Magnesium (mg/dL)  Date Value  03/07/2021 1.7   Calcium (mg/dL)  Date Value  03/07/2021 7.5 (L)   Albumin (g/dL)  Date Value  03/07/2021 3.1 (L)   Phosphorus (mg/dL)  Date Value  03/07/2021 2.6   Sodium (mmol/L)  Date Value  03/07/2021 148 (H)    Assessment: Desiree Madden is a 70 y.o. female admitted on 02/21/2021 with intra-abdominal infection (diverticulitis w/ fecal perforation). Patient septic with acute diverticulitis abdominal abscess with colovesical fistula and enterococcus urinary tract infection which was further complicated by the development of complicated diverticulitis with perforation and gangrenous changes requiring Hartman's procedure performed on 03/01/21. Pt developed severe septic shock after procedure requiring vasopressors. Additionally, pt has metabolic acidosis. PMH includes CAD s/p CABG (2015), HTN, hypothyroidism, colon bladder fistula, diverticulitis, and ulcerative colitis. Pharmacy has been consulted for electrolyte monitoring.  Patient was extubated and re-intubated 3/29  Diuretics: Furosemide 40 mg IV x 1 dose, metolazone 2.5 mg PO x 1 dose  Nutrition: Continous tube feeds restarted 3/30 @ 55 mL/hr  Goal of Therapy:  Electrolytes WNL  Plan:  3/30 @ 1850 :   K = 3.3  ,  KCl 40 mEq PO X 1 given @ 2000 so this value does not reflect that dose.  Ca = 7.5,  Alb = 3.1,  Corrected Ca = 8.2  Will order Calcium gluconate 1 gm IV X 1 and recheck electrolytes on 3/31 with AM labs.   Doshie Maggi D, PharmD 03/07/2021 10:50 PM

## 2021-03-07 NOTE — Progress Notes (Signed)
PHARMACY NOTE:  ANTIMICROBIAL RENAL DOSAGE ADJUSTMENT  Current antimicrobial regimen includes a mismatch between antimicrobial dosage and estimated renal function.  As per policy approved by the Pharmacy & Therapeutics and Medical Executive Committees, the antimicrobial dosage will be adjusted accordingly.  Current antimicrobial dosage:  Meropenem 1 g q12h   Indication: Intra-abdominal infection  Renal Function:  Estimated Creatinine Clearance: 51.4 mL/min (by C-G formula based on SCr of 0.96 mg/dL).    Antimicrobial dosage has been changed to:  Meropenem 1g q8h   Additional comments:  Thank you for allowing pharmacy to be a part of this patient's care.  [name], PharmD Pharmacy Resident  03/07/2021 3:52 PM

## 2021-03-07 NOTE — Progress Notes (Addendum)
[facility] for Electrolyte Monitoring and Replacement   Recent Labs: Potassium (mmol/L)  Date Value  03/07/2021 3.1 (L)   Magnesium (mg/dL)  Date Value  03/07/2021 1.9   Calcium (mg/dL)  Date Value  03/07/2021 7.8 (L)   Albumin (g/dL)  Date Value  03/06/2021 2.9 (L)   Phosphorus (mg/dL)  Date Value  03/06/2021 3.2   Sodium (mmol/L)  Date Value  03/07/2021 148 (H)    Assessment: Desiree Madden is a 70 y.o. female admitted on 02/21/2021 with intra-abdominal infection (diverticulitis w/ fecal perforation). Patient septic with acute diverticulitis abdominal abscess with colovesical fistula and enterococcus urinary tract infection which was further complicated by the development of complicated diverticulitis with perforation and gangrenous changes requiring Hartman's procedure performed on 03/01/21. Pt developed severe septic shock after procedure requiring vasopressors. Additionally, pt has metabolic acidosis. PMH includes CAD s/p CABG (2015), HTN, hypothyroidism, colon bladder fistula, diverticulitis, and ulcerative colitis. Pharmacy has been consulted for electrolyte monitoring.  Patient was extubated and re-intubated 3/29  Diuretics: Furosemide 40 mg IV x 1 dose, metolazone 2.5 mg PO x 1 dose  Nutrition: Continous tube feeds restarted 3/30 @ 55 mL/hr  Goal of Therapy:  Electrolytes WNL  Plan:  -- Na 148 - Increase free water flushes to 100 mL q4h K: -- 3/30: K @ 0347: 2.6 - replaced with KCl 10 mEq IV X 6, ordered additional 40 mEq PO x 1 dose -- 3/30: Repeat K @ 1332: 3.1 - replaced with KCl 40 mEq PO x 2 doses -- F/u electrolytes with AM labs  [name], PharmD Pharmacy Resident  03/07/2021 3:48 PM

## 2021-03-07 NOTE — Plan of Care (Signed)
Patient remains intubated and sedated, tube feeds restarted, titrating Neo and sedation, tolerating vent weans. Plan for trach tomorrow. Daughter at bedside to visit in morning and evening, all questions and concerns addressed, she signed trach consent and it has been placed in chart.   Problem: Education: Goal: Knowledge of General Education information will improve Description: Including pain rating scale, medication(s)/side effects and non-pharmacologic comfort measures Outcome: Not Progressing   Problem: Health Behavior/Discharge Planning: Goal: Ability to manage health-related needs will improve Outcome: Not Progressing   Problem: Clinical Measurements: Goal: Ability to maintain clinical measurements within normal limits will improve Outcome: Not Progressing Goal: Will remain free from infection Outcome: Not Progressing Goal: Diagnostic test results will improve Outcome: Not Progressing Goal: Respiratory complications will improve Outcome: Not Progressing Goal: Cardiovascular complication will be avoided Outcome: Not Progressing   Problem: Activity: Goal: Risk for activity intolerance will decrease Outcome: Not Progressing   Problem: Nutrition: Goal: Adequate nutrition will be maintained Outcome: Not Progressing   Problem: Coping: Goal: Level of anxiety will decrease Outcome: Not Progressing   Problem: Elimination: Goal: Will not experience complications related to bowel motility Outcome: Not Progressing Goal: Will not experience complications related to urinary retention Outcome: Not Progressing   Problem: Pain Managment: Goal: General experience of comfort will improve Outcome: Not Progressing   Problem: Safety: Goal: Ability to remain free from injury will improve Outcome: Not Progressing   Problem: Skin Integrity: Goal: Risk for impaired skin integrity will decrease Outcome: Not Progressing

## 2021-03-07 NOTE — Consult Note (Signed)
Desiree Madden, Desiree Madden [id number] 1951-01-18 Desiree Nearing, MD  Reason for Consult: Tracheostomy Requesting Physician: [name], MD Consulting Physician: Desiree Nearing, MD  HPI: This 70 y.o. year old female was admitted on 02/21/2021 for Lower urinary tract infectious disease [N39.0] Diverticulitis [K57.92]. She is status post abdominal surgery with prior perforation, colovesical fistula, pelvic abscess and status post drain placement. Her hospital course has been complicated by acute respiratory failure and she failed an attempt at extubation yesterday, so Dr. Mortimer Fries has requested a tracheostomy.  Medications:  Current Facility-Administered Medications  Medication Dose Route Frequency Provider Last Rate Last Admin  . 0.9 %  sodium chloride infusion  250 mL Intravenous Continuous Nelle Don, MD   Stopped at 03/02/21 1553  . acetaminophen (TYLENOL) tablet 650 mg  650 mg Per Tube Q6H PRN [name], RPH       Or  . acetaminophen (TYLENOL) suppository 650 mg  650 mg Rectal Q6H PRN [name], RPH      . atorvastatin (LIPITOR) tablet 20 mg  20 mg Per Tube Daily [name], RPH   20 mg at 03/07/21 8250  . chlorhexidine gluconate (MEDLINE KIT) (PERIDEX) 0.12 % solution 15 mL  15 mL Mouth Rinse BID Nelle Don, MD   15 mL at 03/07/21 0800  . Chlorhexidine Gluconate Cloth 2 % PADS 6 each  6 each Topical Daily [name], MD   6 each at 03/06/21 2136  . docusate (COLACE) 50 MG/5ML liquid 100 mg  100 mg Per Tube BID [name], NP   100 mg at 03/06/21 2136  . fentaNYL (SUBLIMAZE) injection 25 mcg  25 mcg Intravenous Q15 min PRN [name], NP   25 mcg at 03/05/21 [phone]  . fentaNYL (SUBLIMAZE) injection 25-100 mcg  25-100 mcg Intravenous Q30 min PRN [name], NP   100 mcg at 03/07/21 0949  . fentaNYL 2538mg in NS 2583m(1017mml) infusion-PREMIX  0-400 mcg/hr Intravenous Continuous [name] 30 mL/hr at 03/07/21 1200 300 mcg/hr at 03/07/21  1200  . [START ON 03/08/2021] fluconazole (DIFLUCAN) IVPB 400 mg  400 mg Intravenous Q24H Ravishankar, [name]      . free water 100 mL  100 mL Per Tube Q4H [name]      . HYDROcodone-acetaminophen (NORCO/VICODIN) 5-325 MG per tablet 1 tablet  1 tablet Per Tube Q4H PRN BelRenda RollsPH      . HYDROmorphone (DILAUDID) injection 0.5 mg  0.5 mg Intravenous Q3H PRN [name]   0.5 mg at 03/06/21 1557  . insulin aspart (novoLOG) injection 0-15 Units  0-15 Units Subcutaneous Q4H [name]   2 Units at 03/07/21 [phone] ipratropium-albuterol (DUONEB) 0.5-2.5 (3) MG/3ML nebulizer solution 3 mL  3 mL Nebulization Q4H PRN [name]      . levothyroxine (SYNTHROID) tablet 75 mcg  75 mcg Per Tube Q0600 BelRenda RollsPH   75 mcg at 03/07/21 0502  . lip balm (BLISTEX) ointment   Topical PRN [name]      . MEDLINE mouth rinse  15 mL Mouth Rinse 10 times per day [name]   15 mL at 03/07/21 1209  . meropenem (MERREM) 1 g in sodium chloride 0.9 % 100 mL IVPB  1 g Intravenous Q8H CunDeatra Robinson RPH 200 mL/hr at 03/07/21 1202 1 g at 03/07/21 1202  . metolazone (ZAROXOLYN) tablet 2.5 mg  2.5 mg  Per Tube Once [name], MD      . midazolam (VERSED) 50 mg/50 mL (1 mg/mL) premix infusion  0.5-10 mg/hr Intravenous Continuous [name], MD 4 mL/hr at 03/07/21 1200 4 mg/hr at 03/07/21 1200  . midazolam (VERSED) injection 1-2 mg  1-2 mg Intravenous Q2H PRN [name], [name], NP   2 mg at 03/07/21 0906  . midodrine (PROAMATINE) tablet 10 mg  10 mg Per Tube TID WC [name], MD   10 mg at 03/07/21 0912  . multivitamin with minerals tablet 1 tablet  1 tablet Per Tube Daily [name], RPH   1 tablet at 03/07/21 9622  . ondansetron (ZOFRAN) tablet 4 mg  4 mg Per Tube Q6H PRN [name], RPH       Or  . ondansetron (ZOFRAN) injection 4 mg  4 mg Intravenous Q6H PRN [name], RPH      . pantoprazole (PROTONIX) injection  40 mg  40 mg Intravenous Daily [name], NP   40 mg at 03/07/21 0911  . phenylephrine CONCENTRATED 151m in sodium chloride 0.9% 2559m(0.53m29mL) infusion  0-400 mcg/min Intravenous Titrated [name] 2.1 mL/hr at 03/07/21 1200 14 mcg/min at 03/07/21 1200  . polyethylene glycol (MIRALAX / GLYCOLAX) packet 17 g  17 g Per Tube Daily [name]   17 g at 03/06/21 1050  . sodium chloride flush (NS) 0.9 % injection 10-40 mL  10-40 mL Intracatheter Q12H [name]   10 mL at 03/07/21 1015  . sodium chloride flush (NS) 0.9 % injection 10-40 mL  10-40 mL Intracatheter PRN [name]      . sodium chloride flush (NS) 0.9 % injection 5 mL  5 mL Intracatheter Q8H [name]   5 mL at 03/07/21 0503  .  Medications Prior to Admission  Medication Sig Dispense Refill  . alendronate (FOSAMAX) 70 MG tablet Take 70 mg by mouth once a week. Take with a full glass of water on an empty stomach.    . aMarland Kitchenpirin EC 81 MG tablet Take 81 mg by mouth daily.    . aMarland Kitchenorvastatin (LIPITOR) 20 MG tablet Take 20 mg by mouth daily.    . diclofenac Sodium (VOLTAREN) 1 % GEL Apply topically 4 (four) times daily.    . fluticasone (FLONASE) 50 MCG/ACT nasal spray Place 2 sprays into both nostrils daily.    . gMarland Kitchenbapentin (NEURONTIN) 300 MG capsule Take 300 mg by mouth 3 (three) times daily.    . lMarland Kitchenvothyroxine (SYNTHROID) 75 MCG tablet Take 75 mcg by mouth daily before breakfast.    . meloxicam (MOBIC) 15 MG tablet Take 15 mg by mouth daily.    . metoprolol succinate (TOPROL-XL) 50 MG 24 hr tablet Take 50 mg by mouth daily.    . oMarland KitchenyCODONE (OXY IR/ROXICODONE) 5 MG immediate release tablet Take 5 mg by mouth every 12 (twelve) hours as needed.    . traZODone (DESYREL) 100 MG tablet Take 50-100 mg by mouth at bedtime. Take 1/2 tablet by mouth each evening for sleep, can increase to 100m89mole tablet after 1st week.      Allergies:  Allergies  Allergen Reactions  . Baclofen      Other reaction(s): Headache  . Sertraline     Other reaction(s): Other (See Comments), Other (See Comments) Muscle aches Muscle aches   . Sulfa Antibiotics     Other reaction(s): Other (See Comments), Other (See Comments) Pt  was told not to take it due to colitis Pt was told not to take it due to colitis   . Ibuprofen Nausea And Vomiting  . Trazodone And Nefazodone     PMH:  Past Medical History:  Diagnosis Date  . Hypertension   . Thyroid disease     Fam Hx:  Family History  Problem Relation Age of Onset  . Arthritis Other   . CAD Mother     Soc Hx:  Social History   Socioeconomic History  . Marital status: Married    Spouse name: Not on file  . Number of children: Not on file  . Years of education: Not on file  . Highest education level: Not on file  Occupational History  . Not on file  Tobacco Use  . Smoking status: Former Research scientist (life sciences)  . Smokeless tobacco: Never Used  Vaping Use  . Vaping Use: Never used  Substance and Sexual Activity  . Alcohol use: Never  . Drug use: Never  . Sexual activity: Not on file  Other Topics Concern  . Not on file  Social History Narrative  . Not on file   Social Determinants of Health   Financial Resource Strain: Not on file  Food Insecurity: Not on file  Transportation Needs: Not on file  Physical Activity: Not on file  Stress: Not on file  Social Connections: Not on file  Intimate Partner Violence: Not on file    PSH:  Past Surgical History:  Procedure Laterality Date  . CARDIAC SURGERY    . COLECTOMY WITH COLOSTOMY CREATION/HARTMANN PROCEDURE Left 03/01/2021   Procedure: COLECTOMY WITH COLOSTOMY CREATION/HARTMANN PROCEDURE;  Surgeon: Ronny Bacon, MD;  Location: [facility] ORS;  Service: General;  Laterality: Left;  . Procedures since admission: No admission procedures for hospital encounter.  ROS: Review of systems normal other than 12 systems except per HPI.  PHYSICAL EXAM  Vitals: Blood pressure 139/64, pulse  62, temperature 99.14 F (37.3 C), temperature source Esophageal, resp. rate 15, height 5' 4.02" (1.626 m), weight 67.2 kg, SpO2 98 %.. General: Well-developed, Well-nourished on vent and sedated Mood: Unable to assess. Patient on vent and sedated Orientation: Patient on vent and sedated. Vocal Quality: Unable to assess. Patient intubated, on vent and sedated Head and Face: NCAT. No facial asymmetry. No visible skin lesions. No significant facial scars. No tenderness with sinus percussion. Facial strength normal and symmetric. Ears: External ears with normal landmarks, no lesions. External auditory canals free of infection, cerumen impaction or lesions. Tympanic membranes intact with good landmarks and normal mobility on pneumatic otoscopy. No middle ear effusion. Hearing: Unable to assess. Patient on vent and sedated Nose: External nose normal with midline dorsum and no lesions or deformity. Nasal Cavity reveals essentially midline septum with normal inferior turbinates. No significant mucosal congestion or erythema. Nasal secretions are minimal and clear. No polyps seen on anterior rhinoscopy. Oral Cavity/ Oropharynx: Lips are normal with no lesions. Visualized and gingiva appear normal. Visualized tongue, floor of mouth and pharynx appear normal, though exam is limited due to presence of endotracheal tube. Indirect Laryngoscopy/Nasopharyngoscopy: Visualization of the larynx, hypopharynx and nasopharynx is not possible in this setting with routine examination. Neck: Supple and symmetric with no palpable masses, tenderness or crepitance. The trachea is midline. Thyroid gland is soft, nontender and symmetric with no masses or enlargement. Parotid and submandibular glands are soft, nontender and symmetric, without masses. Lymphatic: Cervical lymph nodes are without palpable lymphadenopathy or tenderness. Respiratory: Patient on ventilator for respiratory  support Cardiovascular: Carotid pulse shows  regular rate and rhythm Neurologic: Unable to assess. Patient on vent and sedated Eyes: Unable to assess. Patient on vent and sedated  MEDICAL DECISION MAKING: Data Review:  Results for orders placed or performed during the hospital encounter of 02/21/21 (from the past 48 hour(s))  Glucose, capillary     Status: Abnormal   Collection Time: 03/05/21  3:32 PM  Result Value Ref Range   Glucose-Capillary 169 (H) 70 - 99 mg/dL    Comment: Glucose reference range applies only to samples taken after fasting for at least 8 hours.  Potassium     Status: None   Collection Time: 03/05/21  3:35 PM  Result Value Ref Range   Potassium 3.7 3.5 - 5.1 mmol/L    Comment: Performed at [facility], [address] [place], [place] [address]  Hemoglobin and hematocrit, blood     Status: Abnormal   Collection Time: 03/05/21  3:35 PM  Result Value Ref Range   Hemoglobin 7.7 (L) 12.0 - 15.0 g/dL   HCT 22.6 (L) 36.0 - 46.0 %    Comment: Performed at [facility], [address] [place], [place] [address]  Glucose, capillary     Status: Abnormal   Collection Time: 03/05/21  7:50 PM  Result Value Ref Range   Glucose-Capillary 193 (H) 70 - 99 mg/dL    Comment: Glucose reference range applies only to samples taken after fasting for at least 8 hours.  Hemoglobin and hematocrit, blood     Status: Abnormal   Collection Time: 03/05/21  8:54 PM  Result Value Ref Range   Hemoglobin 8.4 (L) 12.0 - 15.0 g/dL   HCT 24.6 (L) 36.0 - 46.0 %    Comment: Performed at [facility], [address] [place], [place] [address]  Glucose, capillary     Status: Abnormal   Collection Time: 03/05/21 11:50 PM  Result Value Ref Range   Glucose-Capillary 178 (H) 70 - 99 mg/dL    Comment: Glucose reference range applies only to samples taken after fasting for at least 8 hours.  Glucose, capillary     Status: Abnormal   Collection Time: 03/06/21  4:01 AM  Result Value Ref Range   Glucose-Capillary  178 (H) 70 - 99 mg/dL    Comment: Glucose reference range applies only to samples taken after fasting for at least 8 hours.  Albumin     Status: Abnormal   Collection Time: 03/06/21  4:52 AM  Result Value Ref Range   Albumin 2.9 (L) 3.5 - 5.0 g/dL    Comment: Performed at [facility], [address] [place], [place] [address]  Basic metabolic panel     Status: Abnormal   Collection Time: 03/06/21  4:52 AM  Result Value Ref Range   Sodium 146 (H) 135 - 145 mmol/L   Potassium 3.4 (L) 3.5 - 5.1 mmol/L   Chloride 112 (H) 98 - 111 mmol/L   CO2 28 22 - 32 mmol/L   Glucose, Bld 202 (H) 70 - 99 mg/dL    Comment: Glucose reference range applies only to samples taken after fasting for at least 8 hours.   BUN 45 (H) 8 - 23 mg/dL   Creatinine, Ser 1.18 (H) 0.44 - 1.00 mg/dL   Calcium 7.8 (L) 8.9 - 10.3 mg/dL   GFR, Estimated 50 (L) >60 mL/min    Comment: (NOTE) Calculated using the CKD-EPI Creatinine Equation (2021)    Anion gap 6  5 - 15    Comment: Performed at [facility], [address] [place], [place] [address]  Magnesium     Status: None   Collection Time: 03/06/21  4:52 AM  Result Value Ref Range   Magnesium 2.3 1.7 - 2.4 mg/dL    Comment: Performed at [facility], [address] [place], [place] [address]  Phosphorus     Status: None   Collection Time: 03/06/21  4:52 AM  Result Value Ref Range   Phosphorus 3.2 2.5 - 4.6 mg/dL    Comment: Performed at [facility], [address] [place], [place] [address]  CBC     Status: Abnormal   Collection Time: 03/06/21  4:52 AM  Result Value Ref Range   WBC 19.1 (H) 4.0 - 10.5 K/uL   RBC 2.79 (L) 3.87 - 5.11 MIL/uL   Hemoglobin 8.3 (L) 12.0 - 15.0 g/dL   HCT 24.9 (L) 36.0 - 46.0 %   MCV 89.2 80.0 - 100.0 fL   MCH 29.7 26.0 - 34.0 pg   MCHC 33.3 30.0 - 36.0 g/dL   RDW 15.4 11.5 - 15.5 %   Platelets 142 (L) 150 - 400 K/uL   nRBC 0.0 0.0 - 0.2 %    Comment: Performed at [facility], [address] [place], [place] [address]  Glucose, capillary     Status: Abnormal   Collection Time: 03/06/21  8:41 AM  Result Value Ref Range   Glucose-Capillary 182 (H) 70 - 99 mg/dL    Comment: Glucose reference range applies only to samples taken after fasting for at least 8 hours.  Glucose, capillary     Status: Abnormal   Collection Time: 03/06/21 11:18 AM  Result Value Ref Range   Glucose-Capillary 202 (H) 70 - 99 mg/dL    Comment: Glucose reference range applies only to samples taken after fasting for at least 8 hours.  Glucose, capillary     Status: Abnormal   Collection Time: 03/06/21  3:59 PM  Result Value Ref Range   Glucose-Capillary 226 (H) 70 - 99 mg/dL    Comment: Glucose reference range applies only to samples taken after fasting for at least 8 hours.  Blood gas, arterial     Status: None   Collection Time: 03/06/21  6:11 PM  Result Value Ref Range   FIO2 1.00    Delivery systems VENTILATOR    Mode PRESSURE REGULATED VOLUME CONTROL    VT 450 mL   LHR 15 resp/min   Peep/cpap 10.0 cm H20   pH, Arterial 7.35 7.350 - 7.450   pCO2 arterial 46 32.0 - 48.0 mmHg   pO2, Arterial 100 83.0 - 108.0 mmHg   Bicarbonate 25.4 20.0 - 28.0 mmol/L   Acid-base deficit 0.4 0.0 - 2.0 mmol/L   O2 Saturation 97.4 %   Patient temperature 37.0    Collection site LEFT RADIAL    Sample type ARTERIAL DRAW    Allens test (pass/fail) PASS PASS    Comment: Performed at [facility], [address] [place], [place] [address]  Glucose, capillary     Status: Abnormal   Collection Time: 03/06/21  7:45 PM  Result Value Ref Range   Glucose-Capillary 187 (H) 70 - 99 mg/dL    Comment: Glucose reference range applies only to samples taken after fasting for at least 8 hours.  Glucose, capillary     Status: Abnormal   Collection Time: 03/06/21 11:35 PM  Result Value  Ref Range   Glucose-Capillary 155 (H) 70 - 99 mg/dL    Comment: Glucose reference range applies only to samples  taken after fasting for at least 8 hours.  Magnesium     Status: None   Collection Time: 03/07/21  3:47 AM  Result Value Ref Range   Magnesium 1.9 1.7 - 2.4 mg/dL    Comment: Performed at [facility], [address] [place], [place] [address]  CBC     Status: Abnormal   Collection Time: 03/07/21  3:47 AM  Result Value Ref Range   WBC 21.9 (H) 4.0 - 10.5 K/uL   RBC 2.56 (L) 3.87 - 5.11 MIL/uL   Hemoglobin 7.6 (L) 12.0 - 15.0 g/dL   HCT 23.4 (L) 36.0 - 46.0 %   MCV 91.4 80.0 - 100.0 fL   MCH 29.7 26.0 - 34.0 pg   MCHC 32.5 30.0 - 36.0 g/dL   RDW 15.4 11.5 - 15.5 %   Platelets 148 (L) 150 - 400 K/uL   nRBC 0.0 0.0 - 0.2 %    Comment: Performed at [facility], [address] [place], [place] [address]  Basic metabolic panel     Status: Abnormal   Collection Time: 03/07/21  3:47 AM  Result Value Ref Range   Sodium 147 (H) 135 - 145 mmol/L   Potassium 2.6 (LL) 3.5 - 5.1 mmol/L    Comment: CRITICAL RESULT CALLED TO, READ BACK BY AND VERIFIED WITH REINIER ESTANERO@[id number]  03/07/21 RH    Chloride 116 (H) 98 - 111 mmol/L   CO2 25 22 - 32 mmol/L   Glucose, Bld 153 (H) 70 - 99 mg/dL    Comment: Glucose reference range applies only to samples taken after fasting for at least 8 hours.   BUN 45 (H) 8 - 23 mg/dL   Creatinine, Ser 0.92 0.44 - 1.00 mg/dL   Calcium 7.3 (L) 8.9 - 10.3 mg/dL   GFR, Estimated >60 >60 mL/min    Comment: (NOTE) Calculated using the CKD-EPI Creatinine Equation (2021)    Anion gap 6 5 - 15    Comment: Performed at [facility], [address] [place], [place] [address]  Glucose, capillary     Status: Abnormal   Collection Time: 03/07/21  3:50 AM  Result Value Ref Range   Glucose-Capillary 133 (H) 70 - 99 mg/dL    Comment: Glucose reference range applies only to samples taken after fasting for at least 8 hours.  Glucose, capillary     Status: Abnormal   Collection Time: 03/07/21  8:10 AM  Result Value Ref Range   Glucose-Capillary  129 (H) 70 - 99 mg/dL    Comment: Glucose reference range applies only to samples taken after fasting for at least 8 hours.  Glucose, capillary     Status: Abnormal   Collection Time: 03/07/21 11:25 AM  Result Value Ref Range   Glucose-Capillary 108 (H) 70 - 99 mg/dL    Comment: Glucose reference range applies only to samples taken after fasting for at least 8 hours.  . Portable Chest x-ray  Result Date: 03/06/2021 CLINICAL DATA:  ETT placement EXAM: PORTABLE CHEST 1 VIEW COMPARISON:  03/04/2021 FINDINGS: Endotracheal tube with the tip 3.2 cm above the carina. Nasogastric tube coiled in the stomach. Right subclavian central venous catheter with the tip projecting over the SVC. Bilateral interstitial and alveolar airspace opacities. Small left pleural effusion. No pneumothorax. Stable cardiomediastinal silhouette. No acute osseous abnormality. IMPRESSION: 1. Endotracheal tube with  the tip 3.2 cm above the carina. 2. Nasogastric tube coiled in the stomach. 3. Bilateral interstitial and alveolar airspace opacities concerning for pulmonary edema. Electronically Signed   By: Kathreen Devoid   On: 03/06/2021 18:30  .   ASSESSMENT: Respiratory failure  PLAN: Critical care consult involving over 31 minutes of consultation, chart review, examination, discussion with family, and care coordination. Will schedule tracheostomy. Risks including bleeding, infection, scarring, recurrent laryngeal nerve injury were discussed with patient's family and consent obtained. Discussed low Hgb with Dr. Mortimer Fries. Bleeding is usually not significant for a trach and he doesn't feel the need to transfuse yet, but this would be a consideration if there is unusual bleeding post-op. Fortunately she has a normal platelet count and is not on anticoagulants.    Desiree Nearing, MD 03/07/2021 1:38 PM

## 2021-03-07 NOTE — Progress Notes (Signed)
  Chaplain On-Call received a page from [employer] to visit with the patient's daughter.  Chaplain met daughter [name] and friend Mitzi Hansen at bedside. Provided spiritual and emotional support as [name] described her request for prayers for healing for her mother.  Chaplain offered prayers for healing and comfort.  [name] stated her appreciation for prayer, and also for this Chaplain letting her know about the availability of Chaplains as needed.  Chaplain Pollyann Samples M.Div., [facility]

## 2021-03-07 NOTE — Anesthesia Preprocedure Evaluation (Addendum)
Anesthesia Evaluation  Patient identified by MRN, date of birth, ID band Patient confused    Reviewed: Allergy & Precautions, H&P , NPO status , Patient's Chart, lab work & pertinent test results  History of Anesthesia Complications Negative for: history of anesthetic complications  Airway Mallampati: Intubated  TM Distance: >3 FB Neck ROM: limited    Dental  (+) Chipped   Pulmonary COPD, former smoker,    Pulmonary exam normal        Cardiovascular hypertension, negative cardio ROS Normal cardiovascular exam     Neuro/Psych negative neurological ROS  negative psych ROS   GI/Hepatic Neg liver ROS, PUD, GERD  Medicated and Controlled,  Endo/Other  Hypothyroidism   Renal/GU Renal disease     Musculoskeletal   Abdominal   Peds  Hematology negative hematology ROS (+)   Anesthesia Other Findings Past Medical History: No date: Hypertension No date: Thyroid disease  Past Surgical History: No date: CARDIAC SURGERY 03/01/2021: COLECTOMY WITH COLOSTOMY CREATION/HARTMANN PROCEDURE; Left     Comment:  Procedure: COLECTOMY WITH COLOSTOMY CREATION/HARTMANN               PROCEDURE;  Surgeon: Ronny Bacon, MD;  Location:               [facility] ORS;  Service: General;  Laterality: Left;  BMI    Body Mass Index: 25.42 kg/m      Reproductive/Obstetrics negative OB ROS                            Anesthesia Physical Anesthesia Plan  ASA: IV  Anesthesia Plan: General ETT   Post-op Pain Management:    Induction: Intravenous  PONV Risk Score and Plan: Ondansetron, Dexamethasone, Midazolam and Treatment may vary due to age or medical condition  Airway Management Planned: Oral ETT  Additional Equipment:   Intra-op Plan:   Post-operative Plan: Post-operative intubation/ventilation  Informed Consent: I have reviewed the patients History and Physical, chart, labs and discussed the procedure  including the risks, benefits and alternatives for the proposed anesthesia with the patient or authorized representative who has indicated his/her understanding and acceptance.     Dental Advisory Given  Plan Discussed with: Anesthesiologist, CRNA and Surgeon  Anesthesia Plan Comments: (History and phone consent from the patients daughter Annabelle Harman at [phone]   Daughter consented for risks of anesthesia including but not limited to:  - adverse reactions to medications - damage to eyes, teeth, lips or other oral mucosa - nerve damage due to positioning  - sore throat or hoarseness - Damage to heart, brain, nerves, lungs, other parts of body or loss of life  Daughter voiced understanding.)        Anesthesia Quick Evaluation

## 2021-03-07 NOTE — Progress Notes (Signed)
Date of Admission:  02/21/2021      ID: Desiree Madden is a 70 y.o. female  Principal Problem:   Diverticulitis Active Problems:   Fistula, bladder   Ulcerative colitis ([place])   Hyperlipidemia   Essential hypertension   Hypothyroidism   Abnormal ECG   CKD (chronic kidney disease), stage III (HCC)   Elevated lactic acid level    Subjective: Remains intubated and sedated Back on pressor  Medications:  . atorvastatin  20 mg Per Tube Daily  . chlorhexidine gluconate (MEDLINE KIT)  15 mL Mouth Rinse BID  . Chlorhexidine Gluconate Cloth  6 each Topical Daily  . docusate  100 mg Per Tube BID  . free water  100 mL Per Tube Q4H  . furosemide  40 mg Intravenous Once  . insulin aspart  0-15 Units Subcutaneous Q4H  . levothyroxine  75 mcg Per Tube Q0600  . mouth rinse  15 mL Mouth Rinse 10 times per day  . metolazone  2.5 mg Per Tube Once  . midodrine  10 mg Per Tube TID WC  . multivitamin with minerals  1 tablet Per Tube Daily  . pantoprazole (PROTONIX) IV  40 mg Intravenous Daily  . polyethylene glycol  17 g Per Tube Daily  . sodium chloride flush  10-40 mL Intracatheter Q12H  . sodium chloride flush  5 mL Intracatheter Q8H    Objective: Vital signs in last 24 hours: Temp:  [98.24 F (36.8 C)-99.7 F (37.6 C)] 99.14 F (37.3 C) (03/30 1200) Pulse Rate:  [50-141] 62 (03/30 1200) Resp:  [15-38] 15 (03/30 1200) BP: (97-157)/(54-77) 139/64 (03/30 1200) SpO2:  [86 %-100 %] 98 % (03/30 1200) Arterial Line BP: (118-126)/(56-64) 120/64 (03/29 1600) FiO2 (%):  [50 %-100 %] 50 % (03/30 1157)  PHYSICAL EXAM:  General: intubated and sedated,  Lungs: b/l air entry. Heart: s1s2 Abdomen: Soft, colostomy drain Extremities: atraumatic, no cyanosis. No edema. No clubbing Skin: No rashes or lesions. Or bruising Neurologic: cannot be asssesed  Lab Results Recent Labs    03/06/21 0452 03/07/21 0347  WBC 19.1* 21.9*  HGB 8.3* 7.6*  HCT 24.9* 23.4*  NA 146* 147*  K 3.4*  2.6*  CL 112* 116*  CO2 28 25  BUN 45* 45*  CREATININE 1.18* 0.92   Liver Panel Recent Labs    03/05/21 0500 03/06/21 0452  ALBUMIN 2.3* 2.9*   Sedimentation Rate No results for input(s): ESRSEDRATE in the last 72 hours. C-Reactive Protein No results for input(s): CRP in the last 72 hours.  Microbiology:  Studies/Results: Portable Chest x-ray  Result Date: 03/06/2021 CLINICAL DATA:  ETT placement EXAM: PORTABLE CHEST 1 VIEW COMPARISON:  03/04/2021 FINDINGS: Endotracheal tube with the tip 3.2 cm above the carina. Nasogastric tube coiled in the stomach. Right subclavian central venous catheter with the tip projecting over the SVC. Bilateral interstitial and alveolar airspace opacities. Small left pleural effusion. No pneumothorax. Stable cardiomediastinal silhouette. No acute osseous abnormality. IMPRESSION: 1. Endotracheal tube with the tip 3.2 cm above the carina. 2. Nasogastric tube coiled in the stomach. 3. Bilateral interstitial and alveolar airspace opacities concerning for pulmonary edema. Electronically Signed   By: Kathreen Devoid   On: 03/06/2021 18:30     Assessment/Plan: Acute on chronic diverticulitis, perforation, colovesical fistula, pelvic abscess, drain placement.  Culture from the drain was polymicrobial organisms including Enterococcus, Candida, Clostridium and Bacteroides.  Because of worsening leukocytosis she was taken for laparotomy on 03/01/2021.  Found to have gangrenous colon, fecal  peritonitis and underwent Hartman's procedure, end colostomy and bladder fistula closure.  Was on Vanco cefepime and Flagyl and I saw her yesterday and changed to meropenem and fluconazole.  Acute hypoxic respiratory failure.  Failed extubation had to be reintubated on 03/06/2021.  Septic shock due to the above infection has improved.  Was on pressors but not anymore  Anemia  CKD has resolved.  Discussed the management with her nurse

## 2021-03-07 NOTE — Progress Notes (Signed)
NAME:  Desiree Madden, MRN:  [id number], DOB:  March 03, 1951, LOS: 73 ADMISSION DATE:  02/21/2021,   CONSULTATION DATE:  03/01/2021 REFERRING MD:  Dr. Christian Mate, CHIEF COMPLAINT:  Septic shock   Brief Pt Description:  70 y.o. Female admitted 02/21/2021 due to Diverticulitis with Colovesical fistula and UTI. On 03/01/21 she developed complicated Diverticulitis with perforation requiring Sigmoid colectomy with end colostomy and Hartman's procedure.  Post procedure she returns to ICU and remains intubated with Septic Shock requiring multiple vasopressors.  History of Present Illness:  Desiree Madden is a 70 y.o. Female with a past medical history as listed below who presented to [facility] ED on 02/21/2021 with severe lower abdominal pain and dysuria. She denied fever, chills, or vomiting.  Of note she had been on and off antibiotics over the past several months for recurrent UTI's related to her Colovesical fistula. She was supposed to have surgery for the fistula in the fall of 2021, but it never occurred.  ED Course: CT Abdomen & Pelvis showed evidence of possible diverticulitis and constipation, could not rule out colonic neoplasm.  She was admitted to the Med-Surg unit by the Hospitalist for further workup and treatment of Diverticulitis and UTI.  GI and General Surgery were consulted.  Hospital Course: Pt elected to try medical alternatives instead of proceeding with surgery.  However on 02/28/21 she had worsening fever and Leukocytosis. Repeat CT Abdomen/Pelvis showed an abscess with likely fistula formation, of which a Percutaneous Drain was placed by IR. Despite drain placement, on 03/01/21 her leukocytosis continued to worsen.  Decision was made to proceed with surgery.  She was found to have complicated Diverticulitis with perforation and gangrene of the sigmoid colon, she required a sigmoid colectomy with end colostomy and Hartman's procedure.  She returns to ICU post procedure with severe  septic shock and remains intubated.  Pertinent  Medical History  Hypertension Hypothyroidism Ulcerative colitis Diverticulitis Colon-Bladder fistula  Significant Hospital Events: Including procedures, antibiotic start and stop dates in addition to other pertinent events    02/21/2021: Admitted to Med/Surg unit with Diverticulitis and UTI  02/28/2021: LLQ abdominal drain placed by IR to pelvic abscess  03/01/2021: Worsening Leukocytosis despite drain placement. Found to have complicated Diverticulitis with perforation requiring Sigmoid Colectomy with end colostomy and Hartman's Procedure.  Post procedure she returns to ICU and remains intubated with Septic Shock requiring multiple vasopressors.  Left Femoral CVC and Arterial lines placed emergently  03/04/2021: Severe anasarca, albumin 1.8 DX of NSTEMI  3/28 failure to wean from vent  3/29 severe agitation, septic shock on pressors  3/29 failed extubation, re intubated daughter updated    Cultures:  02/21/21: SARS CoV-2 PCR>>negative 02/21/21: Influenza PCR>>negative 02/21/2021: Urine>>ENTEROCOCCUS FAECALIS,  ENTEROCOCCUS FAECIUM(sensitive to vancomycin and ampicillin) 02/22/2021: HIV screen>> nonreactive 02/28/2021: LLQ Abdominal drain>> Enterococcus avium, other polymicrobial  Candida albicans 03/01/2021: MRSA PCR>> negative 03/01/2021: Blood culture x2>>   Antimicrobials:  Ciprofloxacin 3/16>>3/18 Unasyn 3/23>>3/23 Flagyl 3/16>>3/18; 3/24>>3/29 Cefepime 3/24>>3/29 Vancomycin 3/24>>3/29 UNASYN 3/29-->    Interim History / Subjective:  On vent On pressors Remains critically ill Failed extubation Severe hypoxia    CBC    Component Value Date/Time   WBC 21.9 (H) 03/07/2021 0347   RBC 2.56 (L) 03/07/2021 0347   HGB 7.6 (L) 03/07/2021 0347   HCT 23.4 (L) 03/07/2021 0347   PLT 148 (L) 03/07/2021 0347   MCV 91.4 03/07/2021 0347   MCH 29.7 03/07/2021 0347   MCHC 32.5 03/07/2021 0347   RDW 15.4 03/07/2021 0347  LYMPHSABS 0.4 (L) 03/02/2021 0113   MONOABS 0.6 03/02/2021 0113   EOSABS 0.0 03/02/2021 0113   BASOSABS 0.1 03/02/2021 0113   BMP Latest Ref Rng & Units 03/07/2021 03/06/2021 03/05/2021  Glucose 70 - 99 mg/dL 153(H) 202(H) -  BUN 8 - 23 mg/dL 45(H) 45(H) -  Creatinine 0.44 - 1.00 mg/dL 0.92 1.18(H) -  Sodium 135 - 145 mmol/L 147(H) 146(H) -  Potassium 3.5 - 5.1 mmol/L 2.6(LL) 3.4(L) 3.7  Chloride 98 - 111 mmol/L 116(H) 112(H) -  CO2 22 - 32 mmol/L 25 28 -  Calcium 8.9 - 10.3 mg/dL 7.3(L) 7.8(L) -    Vent Mode: PRVC FiO2 (%):  [35 %-100 %] 65 % Set Rate:  [12 bmp-15 bmp] 15 bmp Vt Set:  [450 mL] 450 mL PEEP:  [5 cmH20-10 cmH20] 10 cmH20   Objective   Blood pressure 124/61, pulse (!) 50, temperature 98.42 F (36.9 C), temperature source Esophageal, resp. rate 15, height 5' 4.02" (1.626 m), weight 67.2 kg, SpO2 100 %.    Vent Mode: PRVC FiO2 (%):  [35 %-100 %] 65 % Set Rate:  [12 bmp-15 bmp] 15 bmp Vt Set:  [450 mL] 450 mL PEEP:  [5 cmH20-10 cmH20] 10 cmH20   Intake/Output Summary (Last 24 hours) at 03/07/2021 0731 Last data filed at 03/07/2021 0501 Gross per 24 hour  Intake 3262.35 ml  Output 2555 ml  Net 707.35 ml   Filed Weights   02/27/21 0900 02/28/21 1014 03/01/21 1511  Weight: 66.9 kg 67.2 kg 67.2 kg   REVIEW OF SYSTEMS  PATIENT IS UNABLE TO PROVIDE COMPLETE REVIEW OF SYSTEMS DUE TO SEVERE CRITICAL ILLNESS AND TOXIC METABOLIC ENCEPHALOPATHY  PHYSICAL EXAMINATION:  GENERAL:critically ill appearing, +resp distress HEAD: Normocephalic, atraumatic.  EYES: Pupils equal, round, reactive to light.  No scleral icterus.  MOUTH: Moist mucosal membrane. NECK: Supple. No thyromegaly. No nodules. No JVD.  PULMONARY: +rhonchi, +wheezing CARDIOVASCULAR: S1 and S2. Regular rate and rhythm. No murmurs, rubs, or gallops.  GASTROINTESTINAL: open colostomy, +bandages  MUSCULOSKELETAL: No swelling, clubbing, or edema.  NEUROLOGIC: obtunded SKIN:intact,warm,dry    Resolved  Hospital Problem list   n/a  Assessment & Plan:  70 yo  with acute and severe septic shock with hemorrhagic shock with acute hypoxic resp failure with septic shock and  NSTEMI due to complicated Complicated Diverticulitis/Pelvic Abscess with Perforation & Gangrene of the Sigmoid Colon, Colovesical Fistula failed trial of extubation and re-intubated 3/29    Severe ACUTE Hypoxic and Hypercapnic Respiratory Failure -continue Mechanical Ventilator support -continue Bronchodilator Therapy -Wean Fio2 and PEEP as tolerated -VAP/VENT bundle implementation Recommend [facility]  ACUTE DAISTOLIC CARDIAC FAILURE- NSTEMI -oxygen as needed left ventricle demonstrates  regional wall motion abnormalities (Concern for basal to mid inferior wall  hypokinesis). Left ventricular   ACUTE KIDNEY INJURY/Renal Failure -continue Foley Catheter-assess need -Avoid nephrotoxic agents -Follow urine output, BMP -Ensure adequate renal perfusion, optimize oxygenation -Renal dose medications  INFECTIOUS DISEASE -continue antibiotics as prescribed -follow up cultures -follow up ID consultation  Anti-infectives (From admission, onward)   Start     Dose/Rate Route Frequency Ordered Stop   03/07/21 1000  meropenem (MERREM) 1 g in sodium chloride 0.9 % 100 mL IVPB        1 g 200 mL/hr over 30 Minutes Intravenous Every 12 hours 03/06/21 2243     03/07/21 0600  fluconazole (DIFLUCAN) IVPB 400 mg        400 mg 100 mL/hr over 120 Minutes Intravenous  Once 03/06/21 2243  03/07/21 0701   03/04/21 1800  vancomycin (VANCOREADY) IVPB 750 mg/150 mL        750 mg 150 mL/hr over 60 Minutes Intravenous Every 24 hours 03/03/21 1442 03/06/21 2040   03/03/21 0800  vancomycin (VANCOREADY) IVPB 1500 mg/300 mL  Status:  Discontinued        1,500 mg 150 mL/hr over 120 Minutes Intravenous Every 48 hours 03/02/21 1410 03/03/21 1442   03/02/21 0800  vancomycin (VANCOREADY) IVPB 750 mg/150 mL  Status:  Discontinued        750 mg 150  mL/hr over 60 Minutes Intravenous Every 24 hours 03/01/21 1954 03/02/21 1409   03/01/21 2200  ceFEPIme (MAXIPIME) 2 g in sodium chloride 0.9 % 100 mL IVPB        2 g 200 mL/hr over 30 Minutes Intravenous Every 12 hours 03/01/21 1903 03/06/21 2203   03/01/21 2100  vancomycin (VANCOREADY) IVPB 1250 mg/250 mL        1,250 mg 166.7 mL/hr over 90 Minutes Intravenous  Once 03/01/21 1954 03/02/21 0008   03/01/21 2000  metroNIDAZOLE (FLAGYL) IVPB 500 mg        500 mg 100 mL/hr over 60 Minutes Intravenous Every 8 hours 03/01/21 1903 03/06/21 2002   02/28/21 1800  piperacillin-tazobactam (ZOSYN) IVPB 3.375 g  Status:  Discontinued        3.375 g 12.5 mL/hr over 240 Minutes Intravenous Every 8 hours 02/28/21 1643 03/01/21 1903   02/28/21 1730  piperacillin-tazobactam (ZOSYN) IVPB 3.375 g  Status:  Discontinued        3.375 g 100 mL/hr over 30 Minutes Intravenous Every 8 hours 02/28/21 1635 02/28/21 1642   02/28/21 1300  Ampicillin-Sulbactam (UNASYN) 3 g in sodium chloride 0.9 % 100 mL IVPB  Status:  Discontinued        3 g 200 mL/hr over 30 Minutes Intravenous Every 6 hours 02/28/21 1156 02/28/21 1635   02/23/21 1230  piperacillin-tazobactam (ZOSYN) IVPB 3.375 g  Status:  Discontinued        3.375 g 12.5 mL/hr over 240 Minutes Intravenous Every 8 hours 02/23/21 1143 02/28/21 1155   02/22/21 0600  ciprofloxacin (CIPRO) IVPB 400 mg  Status:  Discontinued        400 mg 200 mL/hr over 60 Minutes Intravenous Every 12 hours 02/21/21 2028 02/23/21 1143   02/22/21 0400  metroNIDAZOLE (FLAGYL) IVPB 500 mg  Status:  Discontinued        500 mg 100 mL/hr over 60 Minutes Intravenous Every 8 hours 02/21/21 2028 02/23/21 1143   02/21/21 1830  ciprofloxacin (CIPRO) IVPB 400 mg        400 mg 200 mL/hr over 60 Minutes Intravenous  Once 02/21/21 1820 02/21/21 1939   02/21/21 1830  metroNIDAZOLE (FLAGYL) IVPB 500 mg        500 mg 100 mL/hr over 60 Minutes Intravenous  Once 02/21/21 1820 02/21/21 2110       Septic shock -use vasopressors to keep MAP>65 as needed -follow ABG and LA -follow up cultures +CANDIDA ALBICANS +ENTEROCOCCUS AVIUM +CLOSTRIDIUM +BACTEROIDES  INFECTIOUS DISEASE -continue antibiotics as prescribed -follow up cultures -follow up ID consultation    GI GI PROPHYLAXIS as indicated  NUTRITIONAL STATUS DIET-->TF's as tolerated Constipation protocol as indicated  ENDO - ICU hypoglycemic\Hyperglycemia protocol -check FSBS per protocol  ELECTROLYTES -follow labs as needed -replace as needed -pharmacy consultation and following  ACUTE ANEMIA- TRANSFUSE AS NEEDED CONSIDER TRANSFUSION  IF HGB<7 DVT PRX with TED/SCD's ONLY  Best practice (evaluated daily)  Diet: npo Pain/Anxiety/Delirium protocol VAP protocol  DVT prophylaxis: lmwh GI prophylaxis: ppi Disposition:icu   Labs   CBC: Recent Labs  Lab 03/01/21 2022 03/02/21 0113 03/02/21 1812 03/03/21 0510 03/04/21 0454 03/05/21 1610 03/05/21 1535 03/05/21 2054 03/06/21 0452 03/07/21 0347  WBC 27.1* 32.5*   < > 36.3* 23.5* 17.7*  --   --  19.1* 21.9*  NEUTROABS 24.5* 30.7*  --   --   --   --   --   --   --   --   HGB 6.1* 9.8*   < > 9.4* 9.5* 6.6* 7.7* 8.4* 8.3* 7.6*  HCT 18.7* 29.4*   < > 27.2* 27.4* 19.5* 22.6* 24.6* 24.9* 23.4*  MCV 89.9 89.1   < > 85.5 86.4 89.4  --   --  89.2 91.4  PLT 237 187   < > 147* 142* 150  --   --  142* 148*   < > = values in this interval not displayed.    Basic Metabolic Panel: Recent Labs  Lab 03/02/21 0113 03/02/21 0344 03/03/21 0510 03/03/21 1644 03/04/21 0454 03/05/21 0500 03/05/21 1535 03/06/21 0452 03/07/21 0347  NA  --    < > 138  --  137 143  --  146* 147*  K  --    < > 3.1* 3.3* 3.5 2.9* 3.7 3.4* 2.6*  CL  --    < > 101  --  101 108  --  112* 116*  CO2  --    < > 28  --  27 27  --  28 25  GLUCOSE  --    < > 146*  --  165* 188*  --  202* 153*  BUN  --    < > 34*  --  43* 45*  --  45* 45*  CREATININE  --    < > 1.30*  --  1.34*  1.20*  --  1.18* 0.92  CALCIUM  --    < > 6.7*  --  7.5* 7.3*  --  7.8* 7.3*  MG 1.8   < > 2.4 2.2 2.3 1.9  --  2.3 1.9  PHOS 8.1*  --  4.4  --  3.7 2.9  --  3.2  --    < > = values in this interval not displayed.   GFR: Estimated Creatinine Clearance: 53.6 mL/min (by C-G formula based on SCr of 0.92 mg/dL). Recent Labs  Lab 03/01/21 2022 03/01/21 2154 03/02/21 0113 03/02/21 0333 03/02/21 0610 03/02/21 1812 03/03/21 0510 03/04/21 0454 03/05/21 0644 03/05/21 1105 03/06/21 0452 03/07/21 0347  PROCALCITON 7.56  --  20.51  --   --   --  26.93  --   --   --   --   --   WBC 27.1*  --  32.5*  --   --  40.6* 36.3* 23.5* 17.7*  --  19.1* 21.9*  LATICACIDVEN 7.0*   < >  --  2.6* 1.9 1.6  --   --   --  1.1  --   --    < > = values in this interval not displayed.    Liver Function Tests: Recent Labs  Lab 03/01/21 2022 03/02/21 0344 03/04/21 0454 03/05/21 0500 03/06/21 0452  AST 34 62*  --   --   --   ALT 12 16  --   --   --   ALKPHOS 55 58  --   --   --  BILITOT 0.7 1.3*  --   --   --   PROT 3.7* 4.6*  --   --   --   ALBUMIN 1.7* 2.5* 1.8* 2.3* 2.9*   No results for input(s): LIPASE, AMYLASE in the last 168 hours. No results for input(s): AMMONIA in the last 168 hours.  ABG    Component Value Date/Time   PHART 7.35 03/06/2021 1811   PCO2ART 46 03/06/2021 1811   PO2ART 100 03/06/2021 1811   HCO3 25.4 03/06/2021 1811   ACIDBASEDEF 0.4 03/06/2021 1811   O2SAT 97.4 03/06/2021 1811     Coagulation Profile: Recent Labs  Lab 03/01/21 2022 03/02/21 0333  INR 1.7* 1.5*    Cardiac Enzymes: No results for input(s): CKTOTAL, CKMB, CKMBINDEX, TROPONINI in the last 168 hours.  HbA1C: No results found for: HGBA1C  CBG: Recent Labs  Lab 03/06/21 1118 03/06/21 1559 03/06/21 1945 03/06/21 2335 03/07/21 0350  GLUCAP 202* 226* 187* 155* 133*     Past Medical History:  She,  has a past medical history of Hypertension and Thyroid disease.   Surgical History:    Past Surgical History:  Procedure Laterality Date  . CARDIAC SURGERY    . COLECTOMY WITH COLOSTOMY CREATION/HARTMANN PROCEDURE Left 03/01/2021   Procedure: COLECTOMY WITH COLOSTOMY CREATION/HARTMANN PROCEDURE;  Surgeon: Ronny Bacon, MD;  Location: [facility] ORS;  Service: General;  Laterality: Left;     Social History:   reports that she has quit smoking. She has never used smokeless tobacco. She reports that she does not drink alcohol and does not use drugs.   Family History:  Her family history includes Arthritis in an other family member; CAD in her mother.   Allergies Allergies  Allergen Reactions  . Baclofen     Other reaction(s): Headache  . Sertraline     Other reaction(s): Other (See Comments), Other (See Comments) Muscle aches Muscle aches   . Sulfa Antibiotics     Other reaction(s): Other (See Comments), Other (See Comments) Pt was told not to take it due to colitis Pt was told not to take it due to colitis   . Ibuprofen Nausea And Vomiting  . Trazodone And Nefazodone      Home Medications  Prior to Admission medications   Medication Sig Start Date End Date Taking? Authorizing Provider  alendronate (FOSAMAX) 70 MG tablet Take 70 mg by mouth once a week. Take with a full glass of water on an empty stomach.   Yes [provider]  aspirin EC 81 MG tablet Take 81 mg by mouth daily.   Yes [provider]  atorvastatin (LIPITOR) 20 MG tablet Take 20 mg by mouth daily.   Yes [provider]  diclofenac Sodium (VOLTAREN) 1 % GEL Apply topically 4 (four) times daily.   Yes [provider]  fluticasone (FLONASE) 50 MCG/ACT nasal spray Place 2 sprays into both nostrils daily.   Yes [provider]  gabapentin (NEURONTIN) 300 MG capsule Take 300 mg by mouth 3 (three) times daily.   Yes [provider]  levothyroxine (SYNTHROID) 75 MCG tablet Take 75 mcg by mouth daily before breakfast.   Yes [provider]   meloxicam (MOBIC) 15 MG tablet Take 15 mg by mouth daily.   Yes [provider]  metoprolol succinate (TOPROL-XL) 50 MG 24 hr tablet Take 50 mg by mouth daily. 11/23/20  Yes [provider]  oxyCODONE (OXY IR/ROXICODONE) 5 MG immediate release tablet Take 5 mg by mouth every 12 (  twelve) hours as needed.   Yes [provider]  traZODone (DESYREL) 100 MG tablet Take 50-100 mg by mouth at bedtime. Take 1/2 tablet by mouth each evening for sleep, can increase to $RemoveBef'100mg'AnTUeOEAOo$  whole tablet after 1st week.   Yes [provider]      DVT/GI PRX ordered and assessed TRANSFUSIONS AS NEEDED MONITOR FSBS I Assessed the need for Labs I Assessed the need for Foley I Assessed the need for Central Venous Line Family Discussion when available I Assessed the need for Mobilization I made an Assessment of medications to be adjusted accordingly Safety Risk assessment completed  CASE DISCUSSED IN MULTIDISCIPLINARY ROUNDS WITH ICU TEAM    Critical Care Time devoted to patient care services described in this note is 62 minutes.   Overall, patient is critically ill, prognosis is guarded.  Patient with Multiorgan failure and at high risk for cardiac arrest and death.    Corrin Parker, M.D.  [facility]  Medical Director [place] Director [facility] Cardio-Pulmonary Department

## 2021-03-07 NOTE — Progress Notes (Signed)
GOALS OF CARE DISCUSSION  The Clinical status was relayed to family in detail. [name] at Bedside  Updated and notified of patients medical condition.  Patient remains unresponsive and will not open eyes to command.    Patient is having a weak cough and struggling to remove secretions.   Patient with increased WOB and using accessory muscles to breathe Explained to family course of therapy and the modalities     Patient with Progressive multiorgan failure with a very high probablity of a very minimal chance of meaningful recovery despite all aggressive and optimal medical therapy.     PATIENT REMAINS FULL CODE  Family understands the situation.  They have consented and agreed to [facility] Will obtain ENT Consultation   Family are satisfied with Plan of action and management. All questions answered  Additional CC time 35 mins   Nayanna Seaborn Patricia Pesa, M.D.  [facility]  Medical Director [place] Director [facility] Cardio-Pulmonary Department

## 2021-03-07 NOTE — Plan of Care (Signed)
  Problem: Clinical Measurements: Goal: Cardiovascular complication will be avoided Outcome: Progressing   Problem: Activity: Goal: Risk for activity intolerance will decrease Outcome: Progressing   Problem: Nutrition: Goal: Adequate nutrition will be maintained Outcome: Progressing   Problem: Coping: Goal: Level of anxiety will decrease Outcome: Progressing   Problem: Elimination: Goal: Will not experience complications related to bowel motility Outcome: Progressing   Problem: Safety: Goal: Ability to remain free from injury will improve Outcome: Progressing   Problem: Skin Integrity: Goal: Risk for impaired skin integrity will decrease Outcome: Progressing   Problem: Clinical Measurements: Goal: Ability to maintain clinical measurements within normal limits will improve Outcome: Not Progressing Goal: Respiratory complications will improve Outcome: Not Progressing

## 2021-03-07 NOTE — Progress Notes (Signed)
[place] for Electrolyte Monitoring and Replacement   Recent Labs: Potassium (mmol/L)  Date Value  03/07/2021 2.6 (LL)   Magnesium (mg/dL)  Date Value  03/07/2021 1.9   Calcium (mg/dL)  Date Value  03/07/2021 7.3 (L)   Albumin (g/dL)  Date Value  03/06/2021 2.9 (L)   Phosphorus (mg/dL)  Date Value  03/06/2021 3.2   Sodium (mmol/L)  Date Value  03/07/2021 147 (H)    Assessment: Desiree Madden is a 70 y.o. female admitted on 02/21/2021 with intra-abdominal infection (diverticulitis w/ fecal perforation). Patient septic with acute diverticulitis abdominal abscess with colovesical fistula and enterococcus urinary tract infection which was further complicated by the development of complicated diverticulitis with perforation and gangrenous changes requiring Hartman's procedure performed on 03/01/21. Pt developed severe septic shock after procedure requiring vasopressors. Additionally, pt has metabolic acidosis. PMH includes CAD s/p CABG (2015), HTN, hypothyroidism, colon bladder fistula, diverticulitis, and ulcerative colitis. Pharmacy has been consulted for electrolyte monitoring.  Patient was extubated 3/29  Free water 100 ml q4h Continous tube feeds  Goal of Therapy:  Electrolytes WNL  Plan:  3/29 AM labs: Na 146, K 3.4, Phos 3.2, Mg 2.3 --Increase free water flushes as above --KCl 20 mEq per tube x 1 --Follow-up electrolytes with AM labs  3/30:  K @ 0347 = 2.6 Will order KCl 10 mEq IV X 6 and recheck electrolytes on 3/30 @ ~ 1300.   Desiree Madden D 03/07/2021

## 2021-03-07 NOTE — Progress Notes (Signed)
[facility] Hospital Day(s): [place] op day(s): 6 Days Post-Op.   Interval History:  Patient seen and examined Was extubated yesterday but had to be re-intubated late in the day She is currently on 18 mcg/min phenylephrine Patient remains sedated She continues to have leukocytosis to 21.9K Hgb stable, but low, at 7.6 She has been maintaining her renal function, sCr - 0.92; UO - 1.8L She does have significant hypokalemia to 2.6 NGT replaced after intubation; output 350 ccs; tube feedings held for now surgical drain with 340 ccs out; serous  Vital signs in last 24 hours: [min-max] current  Temp:  [97.7 F (36.5 C)-99.7 F (37.6 C)] 98.42 F (36.9 C) (03/30 0500) Pulse Rate:  [47-141] 50 (03/30 0500) Resp:  [12-38] 15 (03/30 0500) BP: (97-136)/(54-77) 124/61 (03/30 0500) SpO2:  [86 %-100 %] 99 % (03/30 0752) Arterial Line BP: (117-152)/(52-64) 120/64 (03/29 1600) FiO2 (%):  [35 %-100 %] 50 % (03/30 0752)     Height: 5' 4.02" (162.6 cm) Weight: 67.2 kg BMI (Calculated): 25.42   Intake/Output last 2 shifts:  03/29 0701 - 03/30 0700 In: 3262.4 [P.O.:50; I.V.:1294.7; NG/GT:730; IV Piggyback:1187.6] Out: 4008 [Urine:1810; Emesis/NG output:350; Drains:345; Stool:50]   Physical Exam:  Constitutional:Intubated, opens eyes Respiratory:On ventilator Cardiovascular: bradycardic this morning, and sinus rhythm  Gastrointestinal:soft, unable to assess tenderness, non-distended. Colostomy in the LUQ, now matured, significant protrusion, no output this morning. Surgical drain in RLQ with serosus output Genitourinary: Foley in palce Integumentary:Laparotomy incision is CDI with staples, wicks in place, serosanguinous drainage, no erythema  Labs:  CBC Latest Ref Rng & Units 03/07/2021 03/06/2021 03/05/2021  WBC 4.0 - 10.5 K/uL 21.9(H) 19.1(H) -  Hemoglobin 12.0 - 15.0 g/dL 7.6(L) 8.3(L) 8.4(L)  Hematocrit 36.0 - 46.0 % 23.4(L) 24.9(L) 24.6(L)   Platelets 150 - 400 K/uL 148(L) 142(L) -   CMP Latest Ref Rng & Units 03/07/2021 03/06/2021 03/05/2021  Glucose 70 - 99 mg/dL 153(H) 202(H) -  BUN 8 - 23 mg/dL 45(H) 45(H) -  Creatinine 0.44 - 1.00 mg/dL 0.92 1.18(H) -  Sodium 135 - 145 mmol/L 147(H) 146(H) -  Potassium 3.5 - 5.1 mmol/L 2.6(LL) 3.4(L) 3.7  Chloride 98 - 111 mmol/L 116(H) 112(H) -  CO2 22 - 32 mmol/L 25 28 -  Calcium 8.9 - 10.3 mg/dL 7.3(L) 7.8(L) -  Total Protein 6.5 - 8.1 g/dL - - -  Total Bilirubin 0.3 - 1.2 mg/dL - - -  Alkaline Phos 38 - 126 U/L - - -  AST 15 - 41 U/L - - -  ALT 0 - 44 U/L - - -     Imaging studies: No new pertinent imaging studies   Assessment/Plan:  70 y.o. critically ill female 6 Days Post-Op s/p Hartman's Procedurefor complicated diverticulitis with gangrenous changes and perforation with feculent peritonitis.   - Further colostomy resection and maturation per Dr Christian Mate  - Faythe Ghee to resume tube feedings   - Appreciate PCCM assistance; vasopressor and ventilator support - Continue Abx (Meropenem); ID on board - Monitor abdominal examination - Monitor colostomy output; WOC RN following  - Monitor leukocytosis; improving - Maintain foley catheter; monitor UO - Midline wound care: Leave wicks in place, cover with dry gauze and ABD pads  All of the above findings and recommendations were discussed with the medical team at bedside  -- Edison Simon, PA-C [facility] 03/07/2021, 8:24 AM [phone] M-F: 7am - 4pm

## 2021-03-08 ENCOUNTER — Inpatient Hospital Stay: Payer: Medicare HMO

## 2021-03-08 ENCOUNTER — Inpatient Hospital Stay: Payer: Medicare HMO | Admitting: Anesthesiology

## 2021-03-08 ENCOUNTER — Encounter: Payer: Self-pay | Admitting: Internal Medicine

## 2021-03-08 ENCOUNTER — Encounter
Admission: EM | Disposition: A | Discharge status: Rehabilitation Facility | Payer: Self-pay | Source: Home / Self Care | Attending: Internal Medicine

## 2021-03-08 DIAGNOSIS — R7989 Other specified abnormal findings of blood chemistry: Secondary | ICD-10-CM | POA: Diagnosis not present

## 2021-03-08 DIAGNOSIS — J9601 Acute respiratory failure with hypoxia: Secondary | ICD-10-CM | POA: Diagnosis not present

## 2021-03-08 DIAGNOSIS — K651 Peritoneal abscess: Secondary | ICD-10-CM | POA: Diagnosis not present

## 2021-03-08 DIAGNOSIS — K5792 Diverticulitis of intestine, part unspecified, without perforation or abscess without bleeding: Secondary | ICD-10-CM | POA: Diagnosis not present

## 2021-03-08 HISTORY — PX: TRACHEOSTOMY TUBE PLACEMENT: SHX814

## 2021-03-08 LAB — BASIC METABOLIC PANEL
Anion gap: 8 (ref 5–15)
BUN: 44 mg/dL — ABNORMAL HIGH (ref 8–23)
CO2: 28 mmol/L (ref 22–32)
Calcium: 8.5 mg/dL — ABNORMAL LOW (ref 8.9–10.3)
Chloride: 113 mmol/L — ABNORMAL HIGH (ref 98–111)
Creatinine, Ser: 1.29 mg/dL — ABNORMAL HIGH (ref 0.44–1.00)
GFR, Estimated: 45 mL/min — ABNORMAL LOW (ref >60)
Glucose, Bld: 90 mg/dL (ref 70–99)
Potassium: 3.9 mmol/L (ref 3.5–5.1)
Sodium: 149 mmol/L — ABNORMAL HIGH (ref 135–145)

## 2021-03-08 LAB — GLUCOSE, CAPILLARY
Glucose-Capillary: 118 mg/dL — ABNORMAL HIGH (ref 70–99)
Glucose-Capillary: 133 mg/dL — ABNORMAL HIGH (ref 70–99)
Glucose-Capillary: 167 mg/dL — ABNORMAL HIGH (ref 70–99)
Glucose-Capillary: 175 mg/dL — ABNORMAL HIGH (ref 70–99)
Glucose-Capillary: 87 mg/dL (ref 70–99)
Glucose-Capillary: 90 mg/dL (ref 70–99)

## 2021-03-08 LAB — CBC
HCT: 26.8 % — ABNORMAL LOW (ref 36.0–46.0)
Hemoglobin: 8.7 g/dL — ABNORMAL LOW (ref 12.0–15.0)
MCH: 29.9 pg (ref 26.0–34.0)
MCHC: 32.5 g/dL (ref 30.0–36.0)
MCV: 92.1 fL (ref 80.0–100.0)
Platelets: 187 10*3/uL (ref 150–400)
RBC: 2.91 MIL/uL — ABNORMAL LOW (ref 3.87–5.11)
RDW: 16.7 % — ABNORMAL HIGH (ref 11.5–15.5)
WBC: 24.6 10*3/uL — ABNORMAL HIGH (ref 4.0–10.5)
nRBC: 0.1 % (ref 0.0–0.2)

## 2021-03-08 SURGERY — CREATION, TRACHEOSTOMY
Anesthesia: General

## 2021-03-08 MED ORDER — PHENYLEPHRINE HCL (PRESSORS) 10 MG/ML IV SOLN
INTRAVENOUS | Status: AC
  Filled 2021-03-08: qty 1

## 2021-03-08 MED ORDER — LACTATED RINGERS IV SOLN: INTRAVENOUS | Status: DC | PRN

## 2021-03-08 MED ORDER — KETAMINE HCL 50 MG/5ML IJ SOSY
PREFILLED_SYRINGE | INTRAMUSCULAR | Status: AC
  Filled 2021-03-08: qty 5

## 2021-03-08 MED ORDER — MIDAZOLAM HCL 2 MG/2ML IJ SOLN
INTRAMUSCULAR | Status: AC
  Filled 2021-03-08: qty 2

## 2021-03-08 MED ORDER — ROCURONIUM BROMIDE 100 MG/10ML IV SOLN
INTRAVENOUS | Status: DC | PRN
  Administered 2021-03-08: 50 mg via INTRAVENOUS
  Administered 2021-03-08: 30 mg via INTRAVENOUS

## 2021-03-08 MED ORDER — MIDAZOLAM HCL 2 MG/2ML IJ SOLN
INTRAMUSCULAR | Status: DC | PRN
  Administered 2021-03-08: 2 mg via INTRAVENOUS

## 2021-03-08 MED ORDER — FENTANYL CITRATE (PF) 100 MCG/2ML IJ SOLN
INTRAMUSCULAR | Status: DC | PRN
  Administered 2021-03-08: 100 ug via INTRAVENOUS

## 2021-03-08 MED ORDER — ROCURONIUM BROMIDE 10 MG/ML (PF) SYRINGE
PREFILLED_SYRINGE | INTRAVENOUS | Status: AC
  Filled 2021-03-08: qty 10

## 2021-03-08 MED ORDER — LIDOCAINE-EPINEPHRINE 1 %-1:100000 IJ SOLN
INTRAMUSCULAR | Status: DC | PRN
  Administered 2021-03-08: 4 mL

## 2021-03-08 MED ORDER — ONDANSETRON HCL 4 MG/2ML IJ SOLN
INTRAMUSCULAR | Status: DC | PRN
  Administered 2021-03-08: 4 mg via INTRAVENOUS

## 2021-03-08 MED ORDER — LIDOCAINE HCL (PF) 2 % IJ SOLN
INTRAMUSCULAR | Status: AC
  Filled 2021-03-08: qty 5

## 2021-03-08 MED ORDER — DEXAMETHASONE SODIUM PHOSPHATE 10 MG/ML IJ SOLN
INTRAMUSCULAR | Status: AC
  Filled 2021-03-08: qty 1

## 2021-03-08 MED ORDER — SEVOFLURANE IN SOLN
RESPIRATORY_TRACT | Status: AC
  Filled 2021-03-08: qty 250

## 2021-03-08 MED ORDER — SODIUM CHLORIDE 0.9 % IV SOLN
1.0000 g | Freq: Two times a day (BID) | INTRAVENOUS | Status: DC
  Administered 2021-03-08 – 2021-03-09 (×3): 1 g via INTRAVENOUS
  Filled 2021-03-08 (×6): qty 1

## 2021-03-08 MED ORDER — ONDANSETRON HCL 4 MG/2ML IJ SOLN
INTRAMUSCULAR | Status: AC
  Filled 2021-03-08: qty 2

## 2021-03-08 MED ORDER — PROPOFOL 500 MG/50ML IV EMUL
INTRAVENOUS | Status: AC
  Filled 2021-03-08: qty 100

## 2021-03-08 MED ORDER — EPHEDRINE 5 MG/ML INJ
INTRAVENOUS | Status: AC
  Filled 2021-03-08: qty 10

## 2021-03-08 MED ORDER — FLUCONAZOLE IN SODIUM CHLORIDE 200-0.9 MG/100ML-% IV SOLN
200.0000 mg | INTRAVENOUS | Status: DC
  Administered 2021-03-09 – 2021-03-14 (×6): 200 mg via INTRAVENOUS
  Filled 2021-03-08 (×6): qty 100

## 2021-03-08 MED ORDER — VITAL HIGH PROTEIN PO LIQD
1000.0000 mL | ORAL | Status: DC
  Administered 2021-03-08 – 2021-03-12 (×6): 1000 mL

## 2021-03-08 MED ORDER — DEXAMETHASONE SODIUM PHOSPHATE 10 MG/ML IJ SOLN
INTRAMUSCULAR | Status: DC | PRN
  Administered 2021-03-08: 5 mg via INTRAVENOUS

## 2021-03-08 MED ORDER — FENTANYL CITRATE (PF) 100 MCG/2ML IJ SOLN
INTRAMUSCULAR | Status: AC
  Filled 2021-03-08: qty 2

## 2021-03-08 MED ORDER — KETAMINE HCL 10 MG/ML IJ SOLN
INTRAMUSCULAR | Status: DC | PRN
  Administered 2021-03-08: 50 mg via INTRAVENOUS

## 2021-03-08 SURGICAL SUPPLY — 31 items
BLADE SURG 15 STRL LF DISP TIS (BLADE) ×1 IMPLANT
BLADE SURG 15 STRL SS (BLADE) ×2
BLADE SURG SZ11 CARB STEEL (BLADE) ×2 IMPLANT
CANISTER SUCT 1200ML W/VALVE (MISCELLANEOUS) ×2 IMPLANT
COVER WAND RF STERILE (DRAPES) ×2 IMPLANT
ELECT REM PT RETURN 9FT ADLT (ELECTROSURGICAL) ×2
ELECTRODE REM PT RTRN 9FT ADLT (ELECTROSURGICAL) ×1 IMPLANT
GAUZE PACKING IODOFORM 1/2 (PACKING) IMPLANT
GLOVE SURG ENC MOIS LTX SZ7.5 (GLOVE) ×2 IMPLANT
GOWN STRL REUS W/ TWL LRG LVL3 (GOWN DISPOSABLE) ×2 IMPLANT
GOWN STRL REUS W/TWL LRG LVL3 (GOWN DISPOSABLE) ×4
HEMOSTAT SURGICEL 2X3 (HEMOSTASIS) IMPLANT
HLDR TRACH TUBE NECKBAND 18 (MISCELLANEOUS) ×1 IMPLANT
HOLDER TRACH TUBE NECKBAND 18 (MISCELLANEOUS) ×1
KIT TURNOVER KIT A (KITS) ×2 IMPLANT
LABEL OR SOLS (LABEL) ×2 IMPLANT
MANIFOLD NEPTUNE II (INSTRUMENTS) ×2 IMPLANT
NS IRRIG 500ML POUR BTL (IV SOLUTION) ×2 IMPLANT
PACK HEAD/NECK (MISCELLANEOUS) ×2 IMPLANT
SHEARS HARMONIC 9CM CVD (BLADE) ×2 IMPLANT
SPONGE DRAIN TRACH 4X4 STRL 2S (GAUZE/BANDAGES/DRESSINGS) ×2 IMPLANT
SPONGE KITTNER 5P (MISCELLANEOUS) ×2 IMPLANT
SUCTION FRAZIER HANDLE 10FR (MISCELLANEOUS) ×1
SUCTION TUBE FRAZIER 10FR DISP (MISCELLANEOUS) IMPLANT
SUT ETHILON 2 0 FS 18 (SUTURE) ×2 IMPLANT
SUT SILK 2 0 SH (SUTURE) IMPLANT
SUT VIC AB 3-0 PS2 18 (SUTURE) ×1 IMPLANT
TUBE TRACH  6.0 CUFF FLEX (MISCELLANEOUS) ×1
TUBE TRACH 6.0 CUFF FLEX (MISCELLANEOUS) IMPLANT
TUBE TRACH FLEX 8.0 CUFF (MISCELLANEOUS)
TUBE TRACH FLEX 8.5 CUFF (MISCELLANEOUS) IMPLANT

## 2021-03-08 NOTE — Op Note (Signed)
03/08/2021  8:12 AM    Desiree Madden  [id number]   Pre-Op Diagnosis:  respiratory failure  Post-op Diagnosis: respiratory failure  Procedure: Elective Tracheostomy  Surgeon:  [name]  Anesthesia:  General endotracheal anesthesia  EBL:  minimal  Complications:  None  Findings: None  Procedure: The patient was taken to the Operating Room from the CCU, already intubated, and placed in the supine position.  After induction of general anesthesia, the patient was placed on a shoulder roll with the neck extended. The skin was injected along the proposed incision line over the trachea with 1% lidocaine with epinephrine, 1:100,000. The area was then prepped and draped in the usual sterile fashion.  A 15 blade was then used to incise the skin in a horizontal incision over the trachea. The dissection was carried down to the subcutaneous tissues and through the platysma with the Bovie. Anterior jugular veins were divided with the harmonic scalpel for hemostasis. The strap muscles were divided in the midline and retracted laterally. The thyroid isthmus was exposed and divided in the midline over the trachea and dissected away from the anterior aspect of the trachea with the Bovie. With hemostasis obtained, the anesthesiologist was alerted that the airway was about to be entered so that the oxygen concentration could be lowered to reduce fire risk. The scrub tech prepared the tracheostomy tube, confirming no leak in the balloon cuff. The trachea was then incised between the 2nd and third tracheal rings, and an inferiorly based tracheal flap created by cutting through the third tracheal ring laterally. This was sutured up to the skin with a 3-0 silk suture to help create a tracheocutaneous tract. The airway was suctioned and, after the anesthesiologist pulled the endotracheal tube back,  a #6 Shiley tracheostomy tube was inserted into the tracheal lumen. The inner cannula was placed, the cuff  inflated,  and the patient hooked to the anesthesia circuit for ventilation. CO2 return and adequate ventilation was confirmed with the anesthesiologist. Hemostasis was confirmed and the flange of the tracheostomy tube was sutured to the skin with 3-0 ethilon suture. A trach tie was placed around the neck to further secure the tracheostomy tube. Betadine soaked gauze was placed around the wound.  The patient was then returned to the anesthesiologist and taken to the CCU in stable condition.  Disposition:   Return to the CCU  Plan: Routine trach care and suctioning each shift and PRN. Vent settings per CCU admitting physician. Sutures can be removed in a week.  [name] 03/08/2021 8:12 AM

## 2021-03-08 NOTE — Progress Notes (Signed)
GOALS OF CARE DISCUSSION  The Clinical status was relayed to family in detail.  Updated and notified of patients medical condition. [name] the daughter  Patient remains unresponsive and will not open eyes to command.    Patient is having a weak cough and struggling to remove secretions.   Patient with increased WOB and using accessory muscles to breathe Explained to family course of therapy and the modalities     Patient with Progressive multiorgan failure with a very high probablity of a very minimal chance of meaningful recovery despite all aggressive and optimal medical therapy.    Family understands the situation.  They have consented and agreed to DNR/DNI status Will continue medical management Plan for weaning trials in next 48-72 hrs  Family are satisfied with Plan of action and management. All questions answered  Additional CC time 35 mins   Nike Southers Patricia Pesa, M.D.  [facility]  Medical Director [place] Director [facility] Cardio-Pulmonary Department

## 2021-03-08 NOTE — Progress Notes (Signed)
Nutrition Follow-up  DOCUMENTATION CODES:   Not applicable  INTERVENTION:   Vital HP@ 46m/hr- Initiate at 232mhr and increase by 1058mr q 8 hours until goal rate is reached.   Free water flushes 43m38m hours to maintain tube patency   Regimen provides 1320kcal/day, 115g/day protein and 1284ml83m free water  Pt at high refeed risk; recommend monitor potassium, magnesium and phosphorus labs daily until stable  NUTRITION DIAGNOSIS:   Inadequate oral intake related to acute illness as evidenced by NPO status. -ongoing   GOAL:   Provide needs based on ASPEN/SCCM guidelines  -not met   MONITOR:   Vent status,Labs,Weight trends,Skin,I & O's  ASSESSMENT:   69 y.12 female with h/o HTN, CKD III, colovesical fistula, hypothroidism and HLD who is admitted with diverticulitis and constipation.   Pt s/p sigmoid colectomy with end colostomy, Hartman's procedure, splenic flexure mobilization and fistula repair 3/24  Pt s/p tracheostomy today. NGT placed. Plan is to restart tube feeds today. Per chart, pt is weight stable for the past 5 days.   Medications reviewed and include: colace, insulin, synthroid, MVI, protonix, miralax, fentanyl, diflucan, meropenem  Labs reviewed: K 3.9 wnl, Na 149(H), BUN 44(H), creat 1.29(H) P 2.6 wnl, Mg 1.7 wnl- 3/30 Wbc- 24.6(H), Hgb 8.7(L), Hct 26.8(L)  Patient is currently intubated on ventilator support MV: 6.8 L/min Temp (24hrs), Avg:97 F (36.1 C), Min:93.56 F (34.2 C), Max:99.9 F (37.7 C)  MAP- >65mmH71mOP- 5140ml  60moutput- 70ml  D31ms- 450ml  Di61mrder:    Diet Order            Diet NPO time specified  Diet effective now                EDUCATION NEEDS:   Not appropriate for education at this time  Skin: deep tissue injury L buttock 0.2x2cm Incisions: closed abdomen  Last BM:  3/31- 165ml via 57mmy  Height:   Ht Readings from Last 1 Encounters:  03/07/21 5' 4.02" (1.626 m)    Weight:   Wt Readings  from Last 1 Encounters:  03/01/21 67.2 kg    Ideal Body Weight:  54.5 kg  BMI:  Body mass index is 25.42 kg/m.  Estimated Nutritional Needs:   Kcal:  1383  [id number]  95-110g/day  Fluid:  1.4-1.7L/day  Valrie Jia CampKoleen DistanceDN Please refer to AMION for [facility]/or RD on-call/weekend/after hours pager

## 2021-03-08 NOTE — Progress Notes (Signed)
Per Dr Mortimer Fries- orders to use NG.

## 2021-03-08 NOTE — Progress Notes (Signed)
NAME:  Toshie Demelo, MRN:  [id number], DOB:  [birth year], LOS: 55 ADMISSION DATE:  02/21/2021  Brief Pt Description:  70 y.o. Female admitted 02/21/2021 due to Diverticulitis with Colovesical fistula and UTI. On 03/01/21 she developed complicated Diverticulitis with perforation requiring Sigmoid colectomy with end colostomy and Hartman's procedure. Post procedure she returns to ICU and remains intubated with Septic Shock requiring multiple vasopressors.  History of Present Illness:  Siddhi Dornbush is a 70 y.o. Female with a past medical history as listed below who presented to [facility] ED on 02/21/2021 with severe lower abdominal pain and dysuria. She denied fever, chills, or vomiting.  Of note she had been on and off antibiotics over the past several months for recurrent UTI's related to her Colovesical fistula. She was supposed to have surgery for the fistula in the fall of 2021, but it never occurred.  ED Course: CT Abdomen &Pelvis showed evidence of possible diverticulitis and constipation, could not rule out colonic neoplasm. She was admitted to the Med-Surg unit by the Hospitalist for further workup and treatment of Diverticulitis and UTI. GI and General Surgery were consulted.  Hospital Course: Pt elected to try medical alternatives instead of proceeding with surgery. However on 02/28/21 she had worsening fever and Leukocytosis. Repeat CT Abdomen/Pelvis showed an abscess with likely fistula formation, of which a Percutaneous Drain was placed by IR. Despite drain placement, on 03/01/21 her leukocytosis continued to worsen. Decision was made to proceed with surgery. She was found to have complicated Diverticulitis with perforation and gangrene of the sigmoid colon, she required a sigmoid colectomy with end colostomy and Hartman's procedure.  She returns to ICU post procedure with severe septic shock and remains intubated.  Pertinent Medical History   Hypertension Hypothyroidism Ulcerative colitis Diverticulitis Colon-Bladder fistula  Significant Hospital Events: Including procedures, antibiotic start and stop dates in addition to other pertinent events    02/21/2021:Admitted to Med/Surg unit with Diverticulitis and UTI  02/28/2021:LLQ abdominal drain placed by IR to pelvic abscess  03/01/2021:Worsening Leukocytosis despite drain placement. Found to have complicated Diverticulitis with perforation requiring Sigmoid Colectomy with end colostomy and Hartman's Procedure. Post procedure she returns to ICU and remains intubated with Septic Shock requiring multiple vasopressors. Left Femoral CVC and Arterial lines placed emergently  03/04/2021:Severe anasarca, albumin 1.8 DX of NSTEMI  3/28 failure to wean from vent  3/29 severe agitation, septic shock on pressors  3/29 failed extubation, re intubated daughter updated  3/30 ENT consulted    Cultures:  02/21/21: SARS CoV-2 PCR>>negative 02/21/21: Influenza PCR>>negative 02/21/2021: Urine>>ENTEROCOCCUS FAECALIS, ENTEROCOCCUS FAECIUM(sensitive to vancomycin and ampicillin) 02/22/2021: HIV screen>> nonreactive 02/28/2021: LLQ Abdominal drain>>Enterococcusavium, other polymicrobial Candida albicans 03/01/2021: MRSA PCR>> negative 03/01/2021: Blood culture x2>>  Antimicrobials:  Ciprofloxacin 3/16>>3/18 Unasyn 3/23>>3/23 Flagyl 3/16>>3/18; 3/24>>3/29 Cefepime 3/24>>3/29 Vancomycin 3/24>>3/29 UNASYN 3/29-->    Interim History / Subjective:  Remains intubated on vent On pressors Failure to wean from vent Plan for trach today Severe hypoxia      Objective   Blood pressure (!) 126/58, pulse 66, temperature 98.24 F (36.8 C), resp. rate 15, height 5' 4.02" (1.626 m), weight 67.2 kg, SpO2 100 %.    Vent Mode: PRVC FiO2 (%):  [36 %-50 %] 36 % Set Rate:  [15 bmp] 15 bmp Vt Set:  [450 mL] 450 mL PEEP:  [5 cmH20-8 cmH20] 5 cmH20 Plateau Pressure:  [15 cmH20]  15 cmH20   Intake/Output Summary (Last 24 hours) at 03/08/2021 0733 Last data filed at 03/08/2021 0639 Gross per 24  hour  Intake 1588.17 ml  Output 5825 ml  Net -4236.83 ml   Filed Weights   02/27/21 0900 02/28/21 1014 03/01/21 1511  Weight: 66.9 kg 67.2 kg 67.2 kg      REVIEW OF SYSTEMS  PATIENT IS UNABLE TO PROVIDE COMPLETE REVIEW OF SYSTEMS DUE TO SEVERE CRITICAL ILLNESS AND TOXIC METABOLIC ENCEPHALOPATHY   PHYSICAL EXAMINATION:  GENERAL:critically ill appearing, +resp distress MOUTH: Moist mucosal membrane. NECK: Supple. No thyromegaly. No nodules. No JVD.  PULMONARY: +rhonchi, +wheezing CARDIOVASCULAR: S1 and S2. Regular rate and rhythm. No murmurs, rubs, or gallops.  GASTROINTESTINAL: colostomy, bandages,.  MUSCULOSKELETAL: No swelling, clubbing, or edema.  NEUROLOGIC: obtunded SKIN:intact,warm,dry   Labs/imaging that I havepersonally reviewed  (right click and "Reselect all SmartList Selections" daily)      ASSESSMENT AND PLAN SYNOPSIS 70 yo  with acute and severe septic shock with hemorrhagic shock with acute hypoxic resp failure with septic shock and  NSTEMI due to complicated Complicated Diverticulitis/Pelvic Abscess with Perforation & Gangrene of the Sigmoid Colon, Colovesical Fistula failed trial of extubation and re-intubated 3/29    Severe ACUTE Hypoxic and Hypercapnic Respiratory Failure -continue Mechanical Ventilator support -continue Bronchodilator Therapy -Wean Fio2 and PEEP as tolerated -VAP/VENT bundle implementation Plan for trach today   CARDIAC DIASTOLIC FAILURE-NSTEMI -follow up cardiac enzymes as indicated left ventricle demonstrates  regional wall motion abnormalities (Concern for basal to mid inferior wall  hypokinesis). Left ventricular   CARDIAC ICU monitoring  Septic shock -use vasopressors to keep MAP>65 as needed -follow ABG and LA -follow up cultures +CANDIDA ALBICANS +ENTEROCOCCUS  AVIUM +CLOSTRIDIUM +BACTEROIDES  INFECTIOUS DISEASE -continue antibiotics as prescribed -follow up cultures -follow up ID consultation    ACUTE KIDNEY INJURY/Renal Failure -continue Foley Catheter-assess need -Avoid nephrotoxic agents -Follow urine output, BMP -Ensure adequate renal perfusion, optimize oxygenation -Renal dose medications   NEUROLOGY Acute toxic metabolic encephalopathy, need for sedation Goal RASS -2 to -3   SEPTIC SHOCK -use vasopressors to keep MAP>65 as needed   INFECTIOUS DISEASE -continue antibiotics as prescribed -follow up cultures -follow up ID consultation  ENDO - ICU hypoglycemic\Hyperglycemia protocol -check FSBS per protocol   GI GI PROPHYLAXIS as indicated  NUTRITIONAL STATUS DIET-->TF's as tolerated Constipation protocol as indicated   ELECTROLYTES -follow labs as needed -replace as needed -pharmacy consultation and following   ACUTE ANEMIA- TRANSFUSE AS NEEDED CONSIDER TRANSFUSION  IF HGB<7 DVT PRX with TED/SCD's ONLY     Best practice (right click and "Reselect all SmartList Selections" daily)  Diet:  NPO Pain/Anxiety/Delirium protocol (if indicated): Yes (RASS goal -1) VAP protocol (if indicated): Yes DVT prophylaxis: SCD GI prophylaxis: PPI Glucose control:  SSI Yes Central venous access:  Yes, and it is still needed Arterial line:  N/A Foley:  N/A Mobility:  bed rest  PT consulted: N/A Code Status:  full code   Labs   CBC: Recent Labs  Lab 03/01/21 2022 03/02/21 0113 03/02/21 1812 03/04/21 0454 03/05/21 0644 03/05/21 1535 03/05/21 2054 03/06/21 0452 03/07/21 0347 03/08/21 0702  WBC 27.1* 32.5*   < > 23.5* 17.7*  --   --  19.1* 21.9* 24.6*  NEUTROABS 24.5* 30.7*  --   --   --   --   --   --   --   --   HGB 6.1* 9.8*   < > 9.5* 6.6* 7.7* 8.4* 8.3* 7.6* 8.7*  HCT 18.7* 29.4*   < > 27.4* 19.5* 22.6* 24.6* 24.9* 23.4* 26.8*  MCV 89.9 89.1   < >  86.4 89.4  --   --  89.2 91.4 92.1  PLT 237 187    < > 142* 150  --   --  142* 148* 187   < > = values in this interval not displayed.    Basic Metabolic Panel: Recent Labs  Lab 03/03/21 0510 03/03/21 1644 03/04/21 0454 03/05/21 0500 03/05/21 1535 03/06/21 0452 03/07/21 0347 03/07/21 1332 03/07/21 1850 03/08/21 0531  NA 138  --  137 143  --  146* 147* 148* 148* 149*  K 3.1*   < > 3.5 2.9*   < > 3.4* 2.6* 3.1* 3.3* 3.9  CL 101  --  101 108  --  112* 116* 117* 114* 113*  CO2 28  --  27 27  --  $R'28 25 24 25 28  'yH$ GLUCOSE 146*  --  165* 188*  --  202* 153* 121* 126* 90  BUN 34*  --  43* 45*  --  45* 45* 44* 44* 44*  CREATININE 1.30*  --  1.34* 1.20*  --  1.18* 0.92 0.96 1.01* 1.29*  CALCIUM 6.7*  --  7.5* 7.3*  --  7.8* 7.3* 7.8* 7.5* 8.5*  MG 2.4   < > 2.3 1.9  --  2.3 1.9  --  1.7  --   PHOS 4.4  --  3.7 2.9  --  3.2  --   --  2.6  --    < > = values in this interval not displayed.   GFR: Estimated Creatinine Clearance: 38.2 mL/min (A) (by C-G formula based on SCr of 1.29 mg/dL (H)). Recent Labs  Lab 03/01/21 2022 03/01/21 2154 03/02/21 0113 03/02/21 0333 03/02/21 0610 03/02/21 1812 03/03/21 0510 03/04/21 0454 03/05/21 0644 03/05/21 1105 03/06/21 0452 03/07/21 0347 03/08/21 0702  PROCALCITON 7.56  --  20.51  --   --   --  26.93  --   --   --   --   --   --   WBC 27.1*  --  32.5*  --   --  40.6* 36.3*   < > 17.7*  --  19.1* 21.9* 24.6*  LATICACIDVEN 7.0*   < >  --  2.6* 1.9 1.6  --   --   --  1.1  --   --   --    < > = values in this interval not displayed.    Liver Function Tests: Recent Labs  Lab 03/01/21 2022 03/02/21 0344 03/04/21 0454 03/05/21 0500 03/06/21 0452 03/07/21 1850  AST 34 62*  --   --   --  29  ALT 12 16  --   --   --  15  ALKPHOS 55 58  --   --   --  35*  BILITOT 0.7 1.3*  --   --   --  1.3*  PROT 3.7* 4.6*  --   --   --  4.7*  ALBUMIN 1.7* 2.5* 1.8* 2.3* 2.9* 3.1*   No results for input(s): LIPASE, AMYLASE in the last 168 hours. No results for input(s): AMMONIA in the last 168  hours.  ABG    Component Value Date/Time   PHART 7.35 03/06/2021 1811   PCO2ART 46 03/06/2021 1811   PO2ART 100 03/06/2021 1811   HCO3 25.4 03/06/2021 1811   ACIDBASEDEF 0.4 03/06/2021 1811   O2SAT 97.4 03/06/2021 1811     Coagulation Profile: Recent Labs  Lab 03/01/21 2022 03/02/21 0333  INR 1.7* 1.5*    Cardiac Enzymes:  No results for input(s): CKTOTAL, CKMB, CKMBINDEX, TROPONINI in the last 168 hours.  HbA1C: No results found for: HGBA1C  CBG: Recent Labs  Lab 03/07/21 1125 03/07/21 1535 03/07/21 1941 03/07/21 2346 03/08/21 0423  GLUCAP 108* 103* 128* 127* 87    Allergies Allergies  Allergen Reactions  . Baclofen     Other reaction(s): Headache  . Sertraline     Other reaction(s): Other (See Comments), Other (See Comments) Muscle aches Muscle aches   . Sulfa Antibiotics     Other reaction(s): Other (See Comments), Other (See Comments) Pt was told not to take it due to colitis Pt was told not to take it due to colitis   . Ibuprofen Nausea And Vomiting  . Trazodone And Nefazodone        DVT/GI PRX  assessed I Assessed the need for Labs I Assessed the need for Foley I Assessed the need for Central Venous Line Family Discussion when available I Assessed the need for Mobilization I made an Assessment of medications to be adjusted accordingly Safety Risk assessment completed  CASE DISCUSSED IN MULTIDISCIPLINARY ROUNDS WITH ICU TEAM     Critical Care Time devoted to patient care services described in this note is 60 minutes.   Overall, patient is critically ill, prognosis is guarded.  Patient with Multiorgan failure and at high risk for cardiac arrest and death.    Corrin Parker, M.D.  [facility]  Medical Director [place] Director [facility] Cardio-Pulmonary Department

## 2021-03-08 NOTE — Progress Notes (Signed)
Patient taken for trach procedure during shift change. Unable to assess patient. Per third shift nurse patient has a DTI but did not physically turn her to look at it. RN states that patient was too unstable with heart rate. Once patient returns from procedure this RN will visualize. Labs were not drawn per RN due to her not being comfortable with stopping drips for a few minutes for lab draw.

## 2021-03-08 NOTE — H&P (Signed)
No change in medical status reported by nursing, appears stable for surgery. All questions regarding the procedure answered, and family expressed understanding of the procedure.  [name] $RemoveBefor'@[email or web address]'[email or web address]$ @

## 2021-03-08 NOTE — Progress Notes (Signed)
[place] for Electrolyte Monitoring and Replacement   Recent Labs: Potassium (mmol/L)  Date Value  03/08/2021 3.9   Magnesium (mg/dL)  Date Value  03/07/2021 1.7   Calcium (mg/dL)  Date Value  03/08/2021 8.5 (L)   Albumin (g/dL)  Date Value  03/07/2021 3.1 (L)   Phosphorus (mg/dL)  Date Value  03/07/2021 2.6   Sodium (mmol/L)  Date Value  03/08/2021 149 (H)    Assessment: Desiree Madden is a 70 y.o. female admitted on 02/21/2021 with intra-abdominal infection (diverticulitis w/ fecal perforation). Patient septic with acute diverticulitis abdominal abscess with colovesical fistula and enterococcus urinary tract infection which was further complicated by the development of complicated diverticulitis with perforation and gangrenous changes requiring Hartman's procedure performed on 03/01/21. Pt developed severe septic shock after procedure requiring vasopressors. Additionally, pt has metabolic acidosis. PMH includes CAD s/p CABG (2015), HTN, hypothyroidism, colon bladder fistula, diverticulitis, and ulcerative colitis. Pharmacy has been consulted for electrolyte monitoring.  Patient was extubated and re-intubated 3/29  Diuretics: Furosemide 40 mg IV x 1 dose, metolazone 2.5 mg PO x 1 dose -- Both given on 3/30   Nutrition: Continous tube feeds restarted 3/30 @ 55 mL/hr  Goal of Therapy:  Electrolytes WNL  Plan:  -- Na 149 - Continue free water flushes at 100 mL q4h; continue to monitor -- F/u electrolytes with AM labs  [name], [place] 03/08/2021 12:59 PM

## 2021-03-08 NOTE — Progress Notes (Signed)
Throughout shift-patient HR converting back and forth between SB and Junctional rhythm with a 5 beat run of nonsustained VTACH around 2115. BP also remained unstable with the need to increase vasopressor from 5 mcg to 55 mcg by the end of the shift. Patient supposed to have CHG bath on my shift, but felt patient was too unstable to turn over due to vital signs and numerous drains and incision sites. Morning labs requested to be drawn by this RN because I did not want to pause vasopressors and cause distress to the patient. Lab was able to draw BMP and reported that the CBC hemolyzed. Was told lab will retry to draw labs, but told them I will pause pressors if they are unable to obtain.

## 2021-03-08 NOTE — Progress Notes (Addendum)
PHARMACY NOTE:  ANTIMICROBIAL RENAL DOSAGE ADJUSTMENT  Current antimicrobial regimen includes a mismatch between antimicrobial dosage and estimated renal function.  As per policy approved by the Pharmacy & Therapeutics and Medical Executive Committees, the antimicrobial dosage will be adjusted accordingly.  Current antimicrobial dosage:  Meropenem 1 g q8h and fluconazole 400 mg daily   Indication: Intra-abdominal infection  Renal Function:  Estimated Creatinine Clearance: 38.2 mL/min (A) (by C-G formula based on SCr of 1.29 mg/dL (H)).    Antimicrobial dosage has been changed to:  Meropenem 1g q12h and fluconazole 200 mg daily  Additional comments:  Thank you for allowing pharmacy to be a part of this patient's care.  [name], PharmD Pharmacy Resident  03/08/2021 7:48 AM

## 2021-03-08 NOTE — Progress Notes (Signed)
Patients trach placed this am, intact. Restarted tube feeds per order. Foley intact with adequate urine output. Wound nurse in this am to change ostomy bag and incision. JP drain intact, please review I/o flow sheet. Changed dressings this afternoon. Continue to assess.

## 2021-03-08 NOTE — Progress Notes (Addendum)
[place] Hospital Day(s): 15.   Post op day(s): Day of Surgery.   Interval History:  Patient seen and examined Remains sedated S/P tracheostomy this morning with ENT (Dr Richardson Landry) Still on vasopressor support; 20 mcg/min phenylephrine Leukocytosis worsening; now 24.6K; no fevers Slight bump in renal function; sCr - 1.29; UO - 5.1L; diuresing  Previous hypokalemia resolved She does have hypernatremia to 149 Surgical drain with high output; 450 ccs; serous Colostomy output recorded at 165 ccs Tube feedings held for surgery  Vital signs in last 24 hours: [min-max] current  Temp:  [93.56 F (34.2 C)-99.32 F (37.4 C)] 98.24 F (36.8 C) (03/31 0600) Pulse Rate:  [48-85] 66 (03/31 0600) Resp:  [15-20] 15 (03/31 0600) BP: (82-146)/(43-72) 126/58 (03/31 0600) SpO2:  [94 %-100 %] 94 % (03/31 0825) FiO2 (%):  [36 %-50 %] 50 % (03/31 0825)     Height: 5' 4.02" (162.6 cm) Weight: 67.2 kg BMI (Calculated): 25.42   Intake/Output last 2 shifts:  03/30 0701 - 03/31 0700 In: 1588.2 [I.V.:1008.3; NG/GT:45.3; IV Piggyback:534.6] Out: 5825 [Urine:5140; Emesis/NG output:70; Drains:450; Stool:165]   Physical Exam:  Constitutional: sedated HEENT: s/p tracheostomy  Respiratory:On ventilator Cardiovascular: bradycardic this morning, and sinus rhythm  Gastrointestinal:soft, unable to assess tenderness, non-distended. Colostomy in the LUQ, now matured, significant protrusion, no output this morning. Surgical drain in RLQ with serosus output Genitourinary: Foley in palce Integumentary:Laparotomy incision is CDI with staples, iodoform wicks in place, serosanguinous drainage, no erythema  Labs:  CBC Latest Ref Rng & Units 03/08/2021 03/07/2021 03/06/2021  WBC 4.0 - 10.5 K/uL 24.6(H) 21.9(H) 19.1(H)  Hemoglobin 12.0 - 15.0 g/dL 8.7(L) 7.6(L) 8.3(L)  Hematocrit 36.0 - 46.0 % 26.8(L) 23.4(L) 24.9(L)  Platelets 150 - 400 K/uL 187 148(L) 142(L)   CMP  Latest Ref Rng & Units 03/08/2021 03/07/2021 03/07/2021  Glucose 70 - 99 mg/dL 90 126(H) 121(H)  BUN 8 - 23 mg/dL 44(H) 44(H) 44(H)  Creatinine 0.44 - 1.00 mg/dL 1.29(H) 1.01(H) 0.96  Sodium 135 - 145 mmol/L 149(H) 148(H) 148(H)  Potassium 3.5 - 5.1 mmol/L 3.9 3.3(L) 3.1(L)  Chloride 98 - 111 mmol/L 113(H) 114(H) 117(H)  CO2 22 - 32 mmol/L $RemoveB'28 25 24  'kOIIegDT$ Calcium 8.9 - 10.3 mg/dL 8.5(L) 7.5(L) 7.8(L)  Total Protein 6.5 - 8.1 g/dL - 4.7(L) -  Total Bilirubin 0.3 - 1.2 mg/dL - 1.3(H) -  Alkaline Phos 38 - 126 U/L - 35(L) -  AST 15 - 41 U/L - 29 -  ALT 0 - 44 U/L - 15 -    Imaging studies: No new pertinent imaging studies   Assessment/Plan:  70 y.o. critically female 7 days s/p Hartman's Procedurefor complicated diverticulitis with gangrenous changes and perforation with feculent peritonitis.   - ?? Need for repeat CT given worsening leukocytosis in next 24-48 hours to rule out intra-abdominal source              - Further colostomy resection and maturation per Dr Christian Mate             - Faythe Ghee to resume tube feedings this morning             - Appreciate PCCM assistance; vasopressor and ventilator support - Continue Abx (Meropenem); ID on board - Monitor abdominal examination - Monitor colostomy output; WOC RN following - Monitor leukocytosis; worsening - Maintain foley catheter; monitor UO - Midline wound care: Leave wicks in place, cover with dry gauze and ABD pads  All of the above  findings and recommendations were discussed with themedical team at bedside  -- Edison Simon, PA-C [facility] 03/08/2021, 9:20 AM [phone] M-F: 7am - 4pm   Agree, with need for Abd/Pelvic CT to r/o abscess, would consider other sources, CVL, etc.as etiology for increasing WBC.   Anticipating revision of excess colon tomorrow, could have combined during trip to OR today, if I had been aware.

## 2021-03-08 NOTE — Consult Note (Addendum)
[facility] Nurse ostomy consult note Pt had colostomy surgery performed on 3/24.  Stoma is prolapsed approx 8 inches, with a diameter of 3 inches.  Mod amt pink drainage in the pouch and seeping around peristomal skin. Pt is critically ill in ICU and not ready for teaching, no family members present. Changed wick dressings to midline abd between staples, which were located underneath the ostomy wafer. Applied 4 inch pouching kit Kellie Simmering [phone]) with a 3 inch opening cut out.  2 additional pouching systems ordered to the room for staff nurse use until pt goes to surgery for a revision.  No stool or flatus, small amt pink liquid in the pouch.   Enrolled patient in [place] program: Not yet  WOC Nurse Consult Note: Reason for Consult: Left buttock with dark purple deep tissue pressure injury; .2X2cm, unchanged since previous assessment. Pressure Injury POA: No Dressing procedure/placement/frequency:  Pt is on a low air-loss mattress to decrease pressure.  Topical treatment orders provided for staff nurses to perform as follows to protect from further injury: Foam dressing to buttocks, change Q 3 days or PRN soiling. [facility] team will reassess buttocks weekly to determine if a change in the plan of care is indicated at that time.  Julien Girt MSN, RN, [place], [place], [place]

## 2021-03-08 NOTE — Transfer of Care (Addendum)
Immediate Anesthesia Transfer of Care Note  Patient: Desiree Madden  Procedure(s) Performed: TRACHEOSTOMY (N/A )  Patient Location: ICU  Anesthesia Type:General  Level of Consciousness: sedated  Airway & Oxygen Therapy: Patient Spontanous Breathing and Patient placed on Ventilator (see vital sign flow sheet for setting)  Post-op Assessment: Report given to RN and Post -op Vital signs reviewed and stable  Post vital signs: Reviewed and stable  Last Vitals: see EPIC Vitals Value Taken Time  BP    Temp    Pulse 68 03/08/21 0832  Resp 15 03/08/21 0832  SpO2 95 % 03/08/21 0832  Vitals shown include unvalidated device data.  Last Pain:  Vitals:   03/08/21 0300  TempSrc: Esophageal  PainSc:       Patients Stated Pain Goal: 3 (29/84/73 0856)  Complications: No complications documented.   Communicated with RN regarding NG tube placed to L nare at 63cm depth, placement not confirmed.  Also relayed that patient has 1/4 twitches on train-of-four, offered to reverse, ICU staff declined to have relaxant reversed at this time.

## 2021-03-09 ENCOUNTER — Inpatient Hospital Stay: Payer: Medicare HMO | Admitting: Anesthesiology

## 2021-03-09 ENCOUNTER — Encounter: Payer: Self-pay | Admitting: Internal Medicine

## 2021-03-09 ENCOUNTER — Encounter
Admission: EM | Disposition: A | Discharge status: Rehabilitation Facility | Payer: Self-pay | Source: Home / Self Care | Attending: Internal Medicine

## 2021-03-09 DIAGNOSIS — K651 Peritoneal abscess: Secondary | ICD-10-CM | POA: Diagnosis not present

## 2021-03-09 DIAGNOSIS — K5720 Diverticulitis of large intestine with perforation and abscess without bleeding: Secondary | ICD-10-CM | POA: Diagnosis not present

## 2021-03-09 DIAGNOSIS — J9601 Acute respiratory failure with hypoxia: Secondary | ICD-10-CM | POA: Diagnosis not present

## 2021-03-09 DIAGNOSIS — K9403 Colostomy malfunction: Secondary | ICD-10-CM | POA: Diagnosis not present

## 2021-03-09 DIAGNOSIS — K5792 Diverticulitis of intestine, part unspecified, without perforation or abscess without bleeding: Secondary | ICD-10-CM | POA: Diagnosis not present

## 2021-03-09 DIAGNOSIS — N321 Vesicointestinal fistula: Secondary | ICD-10-CM | POA: Diagnosis not present

## 2021-03-09 HISTORY — PX: COLOSTOMY REVISION: SHX5232

## 2021-03-09 LAB — GLUCOSE, CAPILLARY
Glucose-Capillary: 106 mg/dL — ABNORMAL HIGH (ref 70–99)
Glucose-Capillary: 126 mg/dL — ABNORMAL HIGH (ref 70–99)
Glucose-Capillary: 130 mg/dL — ABNORMAL HIGH (ref 70–99)
Glucose-Capillary: 137 mg/dL — ABNORMAL HIGH (ref 70–99)
Glucose-Capillary: 143 mg/dL — ABNORMAL HIGH (ref 70–99)
Glucose-Capillary: 155 mg/dL — ABNORMAL HIGH (ref 70–99)
Glucose-Capillary: 93 mg/dL (ref 70–99)

## 2021-03-09 LAB — BASIC METABOLIC PANEL
Anion gap: 6 (ref 5–15)
BUN: 47 mg/dL — ABNORMAL HIGH (ref 8–23)
CO2: 30 mmol/L (ref 22–32)
Calcium: 8 mg/dL — ABNORMAL LOW (ref 8.9–10.3)
Chloride: 112 mmol/L — ABNORMAL HIGH (ref 98–111)
Creatinine, Ser: 1.29 mg/dL — ABNORMAL HIGH (ref 0.44–1.00)
GFR, Estimated: 45 mL/min — ABNORMAL LOW (ref >60)
Glucose, Bld: 178 mg/dL — ABNORMAL HIGH (ref 70–99)
Potassium: 3.3 mmol/L — ABNORMAL LOW (ref 3.5–5.1)
Sodium: 148 mmol/L — ABNORMAL HIGH (ref 135–145)

## 2021-03-09 LAB — CBC
HCT: 27.2 % — ABNORMAL LOW (ref 36.0–46.0)
Hemoglobin: 8.9 g/dL — ABNORMAL LOW (ref 12.0–15.0)
MCH: 30.4 pg (ref 26.0–34.0)
MCHC: 32.7 g/dL (ref 30.0–36.0)
MCV: 92.8 fL (ref 80.0–100.0)
Platelets: 175 10*3/uL (ref 150–400)
RBC: 2.93 MIL/uL — ABNORMAL LOW (ref 3.87–5.11)
RDW: 16.5 % — ABNORMAL HIGH (ref 11.5–15.5)
WBC: 17.8 10*3/uL — ABNORMAL HIGH (ref 4.0–10.5)
nRBC: 0 % (ref 0.0–0.2)

## 2021-03-09 LAB — MAGNESIUM: Magnesium: 1.8 mg/dL (ref 1.7–2.4)

## 2021-03-09 LAB — PHOSPHORUS: Phosphorus: 2.5 mg/dL (ref 2.5–4.6)

## 2021-03-09 SURGERY — REVISION, COLOSTOMY
Anesthesia: General

## 2021-03-09 MED ORDER — GLYCOPYRROLATE 0.2 MG/ML IJ SOLN
INTRAMUSCULAR | Status: DC | PRN
  Administered 2021-03-09: .2 mg via INTRAVENOUS

## 2021-03-09 MED ORDER — MAGNESIUM SULFATE 2 GM/50ML IV SOLN
2.0000 g | Freq: Once | INTRAVENOUS | Status: AC
  Administered 2021-03-09: 2 g via INTRAVENOUS
  Filled 2021-03-09: qty 50

## 2021-03-09 MED ORDER — POTASSIUM CHLORIDE 20 MEQ PO PACK
40.0000 meq | PACK | Freq: Once | ORAL | Status: AC
  Administered 2021-03-09: 40 meq
  Filled 2021-03-09: qty 2

## 2021-03-09 MED ORDER — POTASSIUM CHLORIDE CRYS ER 20 MEQ PO TBCR: 40.0000 meq | EXTENDED_RELEASE_TABLET | Freq: Once | ORAL | Status: DC

## 2021-03-09 SURGICAL SUPPLY — 52 items
APL PRP STRL LF DISP 70% ISPRP (MISCELLANEOUS) ×1
BLADE CLIPPER SURG (BLADE) IMPLANT
CHLORAPREP W/TINT 26 (MISCELLANEOUS) ×2 IMPLANT
CLIP VESOCCLUDE LG 6/CT (CLIP) IMPLANT
CLIP VESOCCLUDE MED 6/CT (CLIP) IMPLANT
COVER WAND RF STERILE (DRAPES) ×2 IMPLANT
DRAPE LAPAROTOMY 100X77 ABD (DRAPES) ×2 IMPLANT
DRAPE UNDER BUTTOCK W/FLU (DRAPES) IMPLANT
DRSG OPSITE POSTOP 4X6 (GAUZE/BANDAGES/DRESSINGS) IMPLANT
DRSG OPSITE POSTOP 4X8 (GAUZE/BANDAGES/DRESSINGS) IMPLANT
ELECT BLADE 6.5 EXT (BLADE) ×2 IMPLANT
ELECT CAUTERY BLADE 6.4 (BLADE) ×2 IMPLANT
ELECT REM PT RETURN 9FT ADLT (ELECTROSURGICAL) ×2
ELECTRODE REM PT RTRN 9FT ADLT (ELECTROSURGICAL) ×1 IMPLANT
GAUZE PACKING IODOFORM 1/2 (PACKING) ×2 IMPLANT
GLOVE ORTHO TXT STRL SZ7.5 (GLOVE) ×8 IMPLANT
GOWN STRL REUS W/ TWL LRG LVL3 (GOWN DISPOSABLE) ×5 IMPLANT
GOWN STRL REUS W/TWL LRG LVL3 (GOWN DISPOSABLE) ×10
KIT OSTOMY 2 PC DRNBL 2.25 STR (WOUND CARE) ×1 IMPLANT
KIT OSTOMY DRAINABLE 2.25 STR (WOUND CARE) ×2
KIT TURNOVER CYSTO (KITS) IMPLANT
LEGGING LITHOTOMY PAIR STRL (DRAPES) IMPLANT
LIGASURE IMPACT 36 18CM CVD LR (INSTRUMENTS) IMPLANT
MANIFOLD NEPTUNE II (INSTRUMENTS) ×2 IMPLANT
NEEDLE HYPO 22GX1.5 SAFETY (NEEDLE) ×2 IMPLANT
NS IRRIG 1000ML POUR BTL (IV SOLUTION) ×2 IMPLANT
PACK BASIN MAJOR ARMC (MISCELLANEOUS) ×2 IMPLANT
PACK COLON CLEAN CLOSURE (MISCELLANEOUS) IMPLANT
PAD ABD DERMACEA PRESS 5X9 (GAUZE/BANDAGES/DRESSINGS) ×2 IMPLANT
RELOAD PROXIMATE 75MM BLUE (ENDOMECHANICALS) IMPLANT
RELOAD STAPLER LINEAR PROX 30 (STAPLE) IMPLANT
SOL PREP PVP 2OZ (MISCELLANEOUS)
SOLUTION PREP PVP 2OZ (MISCELLANEOUS) IMPLANT
SPONGE LAP 18X18 RF (DISPOSABLE) IMPLANT
STAPLER CIRCULAR MANUAL XL 25 (STAPLE) IMPLANT
STAPLER CIRCULAR MANUAL XL 29 (STAPLE) IMPLANT
STAPLER CIRCULAR MANUAL XL 33 (STAPLE) IMPLANT
STAPLER GUN LINEAR PROX 60 (STAPLE) IMPLANT
STAPLER PROXIMATE 75MM BLUE (STAPLE) IMPLANT
STAPLER RELOAD LINEAR PROX 30 (STAPLE)
STAPLER SKIN PROX 35W (STAPLE) IMPLANT
SURGILUBE 2OZ TUBE FLIPTOP (MISCELLANEOUS) IMPLANT
SUT CHROMIC 2 0 SH (SUTURE) IMPLANT
SUT PDS AB 0 CT1 27 (SUTURE) IMPLANT
SUT SILK 2 0 (SUTURE)
SUT SILK 2-0 30XBRD TIE 12 (SUTURE) IMPLANT
SUT SILK 3-0 (SUTURE) IMPLANT
SUT VIC AB 3-0 SH 27 (SUTURE)
SUT VIC AB 3-0 SH 27X BRD (SUTURE) IMPLANT
SYR 20ML LL LF (SYRINGE) ×4 IMPLANT
SYR BULB IRRIG 60ML STRL (SYRINGE) ×2 IMPLANT
TRAY FOLEY MTR SLVR 16FR STAT (SET/KITS/TRAYS/PACK) IMPLANT

## 2021-03-09 NOTE — Progress Notes (Signed)
[facility] Cardiology    SUBJECTIVE: Patient intubated more alert with a trach in place   Vitals:   03/09/21 0530 03/09/21 0600 03/09/21 0700 03/09/21 0836  BP: (!) 128/57 134/62 139/64   Pulse: (!) 46 (!) 51 63   Resp: 15 15 (!) 24   Temp:   99.1 F (37.3 C)   TempSrc:      SpO2: 98% 100% 97% 96%  Weight:      Height:         Intake/Output Summary (Last 24 hours) at 03/09/2021 0854 Last data filed at 03/09/2021 0600 Gross per 24 hour  Intake 1914.09 ml  Output 3905 ml  Net -1990.91 ml      PHYSICAL EXAM  General: Well developed, well nourished, in no acute distress HEENT:  Normocephalic and atramatic Neck:  No JVD.  Lungs: Clear bilaterally to auscultation and percussion. Heart: Tachycardia. Normal S1 and S2 without gallops or murmurs.  Abdomen: Bowel sounds are positive, abdomen soft and non-tender  Msk:  Back normal, normal gait. Normal strength and tone for age. Extremities: No clubbing, cyanosis or edema.   Neuro: Alert and oriented X 3. Psych:  Good affect, responds appropriately   LABS: Basic Metabolic Panel: Recent Labs    03/07/21 1850 03/08/21 0531 03/09/21 0453  NA 148* 149* 148*  K 3.3* 3.9 3.3*  CL 114* 113* 112*  CO2 $Re'25 28 30  'dnt$ GLUCOSE 126* 90 178*  BUN 44* 44* 47*  CREATININE 1.01* 1.29* 1.29*  CALCIUM 7.5* 8.5* 8.0*  MG 1.7  --  1.8  PHOS 2.6  --  2.5   Liver Function Tests: Recent Labs    03/07/21 1850  AST 29  ALT 15  ALKPHOS 35*  BILITOT 1.3*  PROT 4.7*  ALBUMIN 3.1*   No results for input(s): LIPASE, AMYLASE in the last 72 hours. CBC: Recent Labs    03/08/21 0702 03/09/21 0453  WBC 24.6* 17.8*  HGB 8.7* 8.9*  HCT 26.8* 27.2*  MCV 92.1 92.8  PLT 187 175   Cardiac Enzymes: No results for input(s): CKTOTAL, CKMB, CKMBINDEX, TROPONINI in the last 72 hours. BNP: Invalid input(s): POCBNP D-Dimer: No results for input(s): DDIMER in the last 72 hours. Hemoglobin A1C: No results for input(s): HGBA1C in the last 72  hours. Fasting Lipid Panel: No results for input(s): CHOL, HDL, LDLCALC, TRIG, CHOLHDL, LDLDIRECT in the last 72 hours. Thyroid Function Tests: No results for input(s): TSH, T4TOTAL, T3FREE, THYROIDAB in the last 72 hours.  Invalid input(s): FREET3 Anemia Panel: No results for input(s): VITAMINB12, FOLATE, FERRITIN, TIBC, IRON, RETICCTPCT in the last 72 hours.  DG Abd 1 View  Result Date: 03/08/2021 CLINICAL DATA:  NG tube placement. EXAM: ABDOMEN - 1 VIEW COMPARISON:  03/08/2021. FINDINGS: NG tube tip coiled in the stomach. No gastric or bowel distention. Ostomy noted over the left lower quadrant. Drainage catheter noted left lower quadrant. Prior CABG. IMPRESSION: NG tube noted with tip coiled in the stomach. Electronically Signed   By: Marcello Moores  Register   On: 03/08/2021 14:00   DG Abd 1 View  Result Date: 03/08/2021 CLINICAL DATA:  Nasogastric tube placement. EXAM: ABDOMEN - 1 VIEW COMPARISON:  None. FINDINGS: The bowel gas pattern is normal. Nasogastric tube tip is seen in proximal stomach. Colostomy is noted in left lower quadrant. No radio-opaque calculi or other significant radiographic abnormality are seen. IMPRESSION: Nasogastric tube tip seen in proximal stomach. No evidence of bowel obstruction or ileus. Electronically Signed   By: Jeneen Rinks  Murlean Caller M.D.   On: 03/08/2021 11:04       TELEMETRY: Atrial fibrillation rate of about 75  ASSESSMENT AND PLAN:  Principal Problem:   Diverticulitis Active Problems:   Fistula, bladder   Ulcerative colitis ([place])   Hyperlipidemia   Essential hypertension   Hypothyroidism   Abnormal ECG   CKD (chronic kidney disease), stage III (HCC)   Elevated lactic acid level Trach in place  Plan Postop colon surgery for diverticulitis abscess Respiratory failure now trached Irregular heartbeat suggestive of atrial fibrillation rate controlled Continue critical care management Conservative management from a cardiac standpoint at this  stage   Yolonda Kida, MD 03/09/2021 8:54 AM

## 2021-03-09 NOTE — Anesthesia Preprocedure Evaluation (Addendum)
Anesthesia Evaluation  Patient identified by MRN, date of birth, ID band Patient confused    Reviewed: Allergy & Precautions, H&P , NPO status , Patient's Chart, lab work & pertinent test results  History of Anesthesia Complications Negative for: history of anesthetic complications  Airway Mallampati: Trach  TM Distance: >3 FB Neck ROM: limited    Dental  (+) Chipped   Pulmonary COPD, former smoker,    Pulmonary exam normal        Cardiovascular hypertension, Normal cardiovascular exam     Neuro/Psych negative neurological ROS  negative psych ROS   GI/Hepatic Neg liver ROS, PUD, GERD  Medicated and Controlled,  Endo/Other  Hypothyroidism   Renal/GU Renal disease     Musculoskeletal   Abdominal   Peds  Hematology negative hematology ROS (+)   Anesthesia Other Findings Past Medical History: No date: Hypertension No date: Thyroid disease  Past Surgical History: No date: CARDIAC SURGERY 03/01/2021: COLECTOMY WITH COLOSTOMY CREATION/HARTMANN PROCEDURE; Left     Comment:  Procedure: COLECTOMY WITH COLOSTOMY CREATION/HARTMANN               PROCEDURE;  Surgeon: Ronny Bacon, MD;  Location:               [facility] ORS;  Service: General;  Laterality: Left;  BMI    Body Mass Index: 25.42 kg/m      Reproductive/Obstetrics negative OB ROS                             Anesthesia Physical  Anesthesia Plan  ASA: IV  Anesthesia Plan: MAC   Post-op Pain Management:    Induction: Intravenous  PONV Risk Score and Plan: Ondansetron, Dexamethasone, Midazolam and Treatment may vary due to age or medical condition  Airway Management Planned: Tracheostomy  Additional Equipment:   Intra-op Plan:   Post-operative Plan: Post-operative intubation/ventilation  Informed Consent: I have reviewed the patients History and Physical, chart, labs and discussed the procedure including the risks,  benefits and alternatives for the proposed anesthesia with the patient or authorized representative who has indicated his/her understanding and acceptance.     Dental Advisory Given  Plan Discussed with: Anesthesiologist, CRNA and Surgeon  Anesthesia Plan Comments: (History and phone consent from the patients daughter Annabelle Harman at [phone]   Daughter consented for risks of anesthesia including but not limited to:  - adverse reactions to medications - damage to eyes, teeth, lips or other oral mucosa - nerve damage due to positioning  - sore throat or hoarseness - Damage to heart, brain, nerves, lungs, other parts of body or loss of life  Daughter voiced understanding.)      Anesthesia Quick Evaluation

## 2021-03-09 NOTE — Anesthesia Postprocedure Evaluation (Signed)
Anesthesia Post Note  Patient: Desiree Madden  Procedure(s) Performed: TRACHEOSTOMY (N/A )  Patient location during evaluation: SICU Anesthesia Type: General Level of consciousness: sedated Pain management: pain level controlled Vital Signs Assessment: post-procedure vital signs reviewed and stable Respiratory status: patient on ventilator - see flowsheet for VS (New trach ) Cardiovascular status: stable Postop Assessment: no apparent nausea or vomiting Anesthetic complications: no   No complications documented.   Last Vitals:  Vitals:   03/09/21 0600 03/09/21 0700  BP: 134/62 139/64  Pulse: (!) 51 63  Resp: 15 (!) 24  Temp:  37.3 C  SpO2: 100% 97%    Last Pain:  Vitals:   03/09/21 0400  TempSrc: Axillary  PainSc:                  Alison Stalling

## 2021-03-09 NOTE — Addendum Note (Signed)
Addendum  created 03/09/21 1428 by Martha Clan, MD   Clinical Note Signed

## 2021-03-09 NOTE — Op Note (Addendum)
Colostomy revision/maturation.  Pre-operative Diagnosis: Redundant, viable/excess colon, with resultant colostomy dysfunction.  Post-operative Diagnosis: same.   Surgeon: Ronny Bacon, M.D., [facility]  Anesthesia: General tracheal  Findings: As expected.  Estimated Blood Loss: 25 mL         Specimens: Portion of descending colon          Complications: none              Procedure Details  The risks of bleeding, infection, any of which could require further surgery were reviewed with the patient's daughter, [name]. The likelihood of improving the patient's symptoms with return to their baseline status is guarded.  The patient and/or family concurred with the proposed plan, giving informed consent.  The patient was taken to Operating Room, identified and the procedure verified.    Prior to the induction of general anesthesia, antibiotic dosing was confirmed current. VTE prophylaxis was in place.  General tracheal was then administered and tolerated well. After the induction, the patient was positioned in the supine position remaining in her ICU bed, and the midline end colostomy area was prepped with Betadine and draped in the sterile fashion.  A Time Out was held and the above information confirmed.  The mesentery accompanying the redundant colon was divided with clamps, ligated with silk and divided to shorten it for the transection.  Hemostasis maintained.  After cautery utilized on the mesentery.  The excess distal colon was excised sharply with cautery.  The resultant end was then matured with interrupted 3-0 Vicryl sutures.  Stomal appliance replaced over benzoin, fitting it to the size of the colostomy.  There had been a significant amount of dermal irritation secondary to the excessively large wafer opening that had been created in order to accommodate the excess colon.  We then cleansed the midline incision and replaced the wicking and a fresh dressing was applied over the midline  incision.  Right lower quadrant drain has no further purpose and was removed.  Dressings applied there as well.  Kept in the same bed, she was returned to the ICU in the same condition.  Ronny Bacon M.D., [facility] 03/09/2021 1:30 PM

## 2021-03-09 NOTE — Progress Notes (Signed)
Patients oxygen saturations were 910% on 30%. Katharine Look, RN asked that RT assess patient. Recruitment maneuver done with patient and saturations rose to 94%.

## 2021-03-09 NOTE — Progress Notes (Signed)
[place] for Electrolyte Monitoring and Replacement   Recent Labs: Potassium (mmol/L)  Date Value  03/09/2021 3.3 (L)   Magnesium (mg/dL)  Date Value  03/09/2021 1.8   Calcium (mg/dL)  Date Value  03/09/2021 8.0 (L)   Albumin (g/dL)  Date Value  03/07/2021 3.1 (L)   Phosphorus (mg/dL)  Date Value  03/09/2021 2.5   Sodium (mmol/L)  Date Value  03/09/2021 148 (H)   Corrected Ca: 8.7  Assessment: Desiree Madden is a 70 y.o. female admitted on 02/21/2021 with intra-abdominal infection (diverticulitis w/ fecal perforation). Patient septic with acute diverticulitis abdominal abscess with colovesical fistula and enterococcus urinary tract infection which was further complicated by the development of complicated diverticulitis with perforation and gangrenous changes requiring Hartman's procedure performed on 03/01/21. Pt developed severe septic shock after procedure requiring vasopressors. Additionally, pt has metabolic acidosis. PMH includes CAD s/p CABG (2015), HTN, hypothyroidism, colon bladder fistula, diverticulitis, and ulcerative colitis. Pharmacy has been consulted for electrolyte monitoring.  Patient was extubated and re-intubated 3/29  3/30: Furosemide 40 mg IV x 1 dose, metolazone 2.5 mg PO x 1 dose  Nutrition: Continous tube feeds restarted 3/30 @ 55 mL/hr  Goal of Therapy:  Electrolytes WNL  Plan:  -- Na 148 - Continue free water flushes at 100 mL q4h; continue to monitor -- K 3.3 - replaced with KCl 40 mEq PO x 1 dose -- Mg 1.8 - Mg 2g IV x 1 dose -- F/u electrolytes with AM labs  [name], PharmD Pharmacy Resident  03/09/2021 9:12 AM

## 2021-03-09 NOTE — Progress Notes (Signed)
GOALS OF CARE DISCUSSION  The Clinical status was relayed to family in detail.  Updated and notified of patients medical condition.  Patient remains unresponsive and will not open eyes to command.   S/p trach with vent support Back to OR today for revision of colostomy Plan for weaning trials this weekend   Patient is having a weak cough and struggling to remove secretions.   Patient with increased WOB and using accessory muscles to breathe Explained to family course of therapy and the modalities     Family understands the situation.  They have consented and agreed to DNR status  Family are satisfied with Plan of action and management. All questions answered  Additional CC time 35 mins   Nelvin Tomb Patricia Pesa, M.D.  [facility]  Medical Director [place] Director [facility] Cardio-Pulmonary Department

## 2021-03-09 NOTE — Progress Notes (Signed)
NAME:  Desiree Madden, MRN:  [id number], DOB:  01-Sep-1951, LOS: 68 ADMISSION DATE:  02/21/2021  Brief Pt Description:  70 y.o. Female admitted 02/21/2021 due to Diverticulitis with Colovesical fistula and UTI. On 03/01/21 she developed complicated Diverticulitis with perforation requiring Sigmoid colectomy with end colostomy and Hartman's procedure. Post procedure she returns to ICU and remains intubated with Septic Shock requiring multiple vasopressors.  History of Present Illness:  Desiree Madden is a 70 y.o. Female with a past medical history as listed below who presented to [facility] ED on 02/21/2021 with severe lower abdominal pain and dysuria. She denied fever, chills, or vomiting.  Of note she had been on and off antibiotics over the past several months for recurrent UTI's related to her Colovesical fistula. She was supposed to have surgery for the fistula in the fall of 2021, but it never occurred.  ED Course: CT Abdomen &Pelvis showed evidence of possible diverticulitis and constipation, could not rule out colonic neoplasm. She was admitted to the Med-Surg unit by the Hospitalist for further workup and treatment of Diverticulitis and UTI. GI and General Surgery were consulted.  Hospital Course: Pt elected to try medical alternatives instead of proceeding with surgery. However on 02/28/21 she had worsening fever and Leukocytosis. Repeat CT Abdomen/Pelvis showed an abscess with likely fistula formation, of which a Percutaneous Drain was placed by IR. Despite drain placement, on 03/01/21 her leukocytosis continued to worsen. Decision was made to proceed with surgery. She was found to have complicated Diverticulitis with perforation and gangrene of the sigmoid colon, she required a sigmoid colectomy with end colostomy and Hartman's procedure.  She returns to ICU post procedure with severe septic shock and remains intubated.  Pertinent Medical History   Hypertension Hypothyroidism Ulcerative colitis Diverticulitis Colon-Bladder fistula  Significant Hospital Events: Including procedures, antibiotic start and stop dates in addition to other pertinent events    02/21/2021:Admitted to Med/Surg unit with Diverticulitis and UTI  02/28/2021:LLQ abdominal drain placed by IR to pelvic abscess  03/01/2021:Worsening Leukocytosis despite drain placement. Found to have complicated Diverticulitis with perforation requiring Sigmoid Colectomy with end colostomy and Hartman's Procedure. Post procedure she returns to ICU and remains intubated with Septic Shock requiring multiple vasopressors. Left Femoral CVC and Arterial lines placed emergently  03/04/2021:Severe anasarca, albumin 1.8 DX of NSTEMI  3/28 failure to wean from vent  3/29 severe agitation, septic shock on pressors  3/29 failed extubation, re intubated daughter updated  3/30 ENT consulted    Cultures:  02/21/21: SARS CoV-2 PCR>>negative 02/21/21: Influenza PCR>>negative 02/21/2021: Urine>>ENTEROCOCCUS FAECALIS, ENTEROCOCCUS FAECIUM(sensitive to vancomycin and ampicillin) 02/22/2021: HIV screen>> nonreactive 02/28/2021: LLQ Abdominal drain>>Enterococcusavium, other polymicrobial Candida albicans 03/01/2021: MRSA PCR>> negative 03/01/2021: Blood culture x2>>  Antimicrobials:  Ciprofloxacin 3/16>>3/18 Unasyn 3/23>>3/23 Flagyl 3/16>>3/18; 3/24>>3/29 Cefepime 3/24>>3/29 Vancomycin 3/24>>3/29 UNASYN 3/29-->    Interim History / Subjective:  Remains intubated on vent On pressors failure to wean from vent S/p trach Remains critically ill Follow up gen surgery       Objective   Blood pressure 139/64, pulse 63, temperature 99.1 F (37.3 C), resp. rate (!) 24, height 5' 4.02" (1.626 m), weight 67.2 kg, SpO2 97 %.    Vent Mode: PRVC FiO2 (%):  [30 %-50 %] 30 % Set Rate:  [15 bmp-16 bmp] 15 bmp Vt Set:  [450 mL] 450 mL PEEP:  [5 cmH20] 5 cmH20    Intake/Output Summary (Last 24 hours) at 03/09/2021 0743 Last data filed at 03/09/2021 0600 Gross per 24 hour  Intake  2114.09 ml  Output 3910 ml  Net -1795.91 ml   Filed Weights   02/27/21 0900 02/28/21 1014 03/01/21 1511  Weight: 66.9 kg 67.2 kg 67.2 kg    REVIEW OF SYSTEMS  PATIENT IS UNABLE TO PROVIDE COMPLETE REVIEW OF SYSTEMS DUE TO SEVERE CRITICAL ILLNESS AND TOXIC METABOLIC ENCEPHALOPATHY  PHYSICAL EXAMINATION:  GENERAL:critically ill appearing, +resp distress HEAD: Normocephalic, atraumatic.  EYES: Pupils equal, round, reactive to light.  No scleral icterus.  MOUTH: Moist mucosal membrane. NECK: Supple. S/p trach PULMONARY: +rhonchi, +wheezing CARDIOVASCULAR: S1 and S2. Regular rate and rhythm. No murmurs, rubs, or gallops.  GASTROINTESTINAL:colostomy, bandages,.  MUSCULOSKELETAL: +edema.  NEUROLOGIC: obtunded SKIN:intact,warm,dry    Labs/imaging that I havepersonally reviewed  (right click and "Reselect all SmartList Selections" daily)      ASSESSMENT AND PLAN SYNOPSIS  70 yo  with acute and severe septic shock with hemorrhagic shock with acute hypoxic resp failure with septic shock and  NSTEMI due to complicated Complicated Diverticulitis/Pelvic Abscess with Perforation & Gangrene of the Sigmoid Colon, Colovesical Fistula failed trial of extubation and re-intubated 3/29   Severe ACUTE Hypoxic and Hypercapnic Respiratory Failure -continue Mechanical Ventilator support -continue Bronchodilator Therapy -Wean Fio2 and PEEP as tolerated -VAP/VENT bundle implementation -will perform SAT/SBT when respiratory parameters are met S/p trach  ACUTE  CARDIAC FAILURE- NSTEMI -oxygen as needed -Lasix as tolerated -follow up cardiac enzymes as indicated -follow up cardiology recs   CARDIAC ICU monitoring   Septic shock -use vasopressors to keep MAP>65 as needed -follow ABG and LA -follow up cultures +CANDIDA ALBICANS +ENTEROCOCCUS  AVIUM +CLOSTRIDIUM +BACTEROIDES  INFECTIOUS DISEASE -continue antibiotics as prescribed -follow up cultures -follow up ID consultation    ACUTE KIDNEY INJURY/Renal Failure -continue Foley Catheter-assess need -Avoid nephrotoxic agents -Follow urine output, BMP -Ensure adequate renal perfusion, optimize oxygenation -Renal dose medications   NEUROLOGY ACUTE TOXIC METABOLIC ENCEPHALOPATHY -need for sedation -Goal RASS -2 to -3   GI GI PROPHYLAXIS as indicated  NUTRITIONAL STATUS DIET-->TF's as tolerated Constipation protocol as indicated Follow up gen surery recs for colostomy and surgery  ELECTROLYTES -follow labs as needed -replace as needed -pharmacy consultation and following    ACUTE ANEMIA- TRANSFUSE AS NEEDED CONSIDER TRANSFUSION  IF HGB<7 DVT PRX with TED/SCD's ONLY      Best practice (right click and "Reselect all SmartList Selections" daily)  Diet:  NPO Pain/Anxiety/Delirium protocol (if indicated): Yes (RASS goal -1) VAP protocol (if indicated): Yes DVT prophylaxis: SCD GI prophylaxis: PPI Glucose control:  SSI Yes Central venous access:  Yes, and it is still needed Arterial line:  N/A Foley:  N/A Mobility:  bed rest  PT consulted: N/A Code Status:  DNR   Labs   CBC: Recent Labs  Lab 03/05/21 0644 03/05/21 1535 03/05/21 2054 03/06/21 0452 03/07/21 0347 03/08/21 0702 03/09/21 0453  WBC 17.7*  --   --  19.1* 21.9* 24.6* 17.8*  HGB 6.6*   < > 8.4* 8.3* 7.6* 8.7* 8.9*  HCT 19.5*   < > 24.6* 24.9* 23.4* 26.8* 27.2*  MCV 89.4  --   --  89.2 91.4 92.1 92.8  PLT 150  --   --  142* 148* 187 175   < > = values in this interval not displayed.    Basic Metabolic Panel: Recent Labs  Lab 03/04/21 0454 03/05/21 0500 03/05/21 1535 03/06/21 0452 03/07/21 0347 03/07/21 1332 03/07/21 1850 03/08/21 0531 03/09/21 0453  NA 137 143  --  146* 147* 148* 148* 149* 148*  K  3.5 2.9*   < > 3.4* 2.6* 3.1* 3.3* 3.9 3.3*  CL 101 108  --  112*  116* 117* 114* 113* 112*  CO2 27 27  --  _0 GLUCOSE 165* 188*  --  202* 153* 121* 126* 90 178*  BUN 43* 45*  --  45* 45* 44* 44* 44* 47*  CREATININE 1.34* 1.20*  --  1.18* 0.92 0.96 1.01* 1.29* 1.29*  CALCIUM 7.5* 7.3*  --  7.8* 7.3* 7.8* 7.5* 8.5* 8.0*  MG 2.3 1.9  --  2.3 1.9  --  1.7  --  1.8  PHOS 3.7 2.9  --  3.2  --   --  2.6  --  2.5   < > = values in this interval not displayed.   GFR: Estimated Creatinine Clearance: 38.2 mL/min (A) (by C-G formula based on SCr of 1.29 mg/dL (H)). Recent Labs  Lab 03/02/21 1812 03/03/21 0510 03/04/21 0454 03/05/21 1105 03/06/21 0452 03/07/21 0347 03/08/21 0702 03/09/21 0453  PROCALCITON  --  26.93  --   --   --   --   --   --   WBC 40.6* 36.3*   < >  --  19.1* 21.9* 24.6* 17.8*  LATICACIDVEN 1.6  --   --  1.1  --   --   --   --    < > = values in this interval not displayed.    Liver Function Tests: Recent Labs  Lab 03/04/21 0454 03/05/21 0500 03/06/21 0452 03/07/21 1850  AST  --   --   --  29  ALT  --   --   --  15  ALKPHOS  --   --   --  35*  BILITOT  --   --   --  1.3*  PROT  --   --   --  4.7*  ALBUMIN 1.8* 2.3* 2.9* 3.1*   No results for input(s): LIPASE, AMYLASE in the last 168 hours. No results for input(s): AMMONIA in the last 168 hours.  ABG    Component Value Date/Time   PHART 7.35 03/06/2021 1811   PCO2ART 46 03/06/2021 1811   PO2ART 100 03/06/2021 1811   HCO3 25.4 03/06/2021 1811   ACIDBASEDEF 0.4 03/06/2021 1811   O2SAT 97.4 03/06/2021 1811     Coagulation Profile: No results for input(s): INR, PROTIME in the last 168 hours.  Cardiac Enzymes: No results for input(s): CKTOTAL, CKMB, CKMBINDEX, TROPONINI in the last 168 hours.  HbA1C: No results found for: HGBA1C  CBG: Recent Labs  Lab 03/08/21 1516 03/08/21 2009 03/08/21 2354 03/09/21 0353 03/09/21 0722  GLUCAP 133* 167* 175* 143* 130*    Allergies Allergies  Allergen Reactions  . Baclofen     Other reaction(s):  Headache  . Sertraline     Other reaction(s): Other (See Comments), Other (See Comments) Muscle aches Muscle aches   . Sulfa Antibiotics     Other reaction(s): Other (See Comments), Other (See Comments) Pt was told not to take it due to colitis Pt was told not to take it due to colitis   . Ibuprofen Nausea And Vomiting  . Trazodone And Nefazodone         DVT/GI PRX  assessed I Assessed the need for Labs I Assessed the need for Foley I Assessed the need for Central Venous Line Family Discussion when available I Assessed the need for Mobilization I made an Assessment of medications to be  adjusted accordingly Safety Risk assessment completed  CASE DISCUSSED IN MULTIDISCIPLINARY ROUNDS WITH ICU TEAM     Critical Care Time devoted to patient care services described in this note is 55 minutes.   Overall, patient is critically ill, prognosis is guarded.  Patient with Multiorgan failure and at high risk for cardiac arrest and death.    Corrin Parker, M.D.  [facility]  Medical Director [place] Director [facility] Cardio-Pulmonary Department

## 2021-03-09 NOTE — Anesthesia Postprocedure Evaluation (Addendum)
Anesthesia Post Note  Patient: Desiree Madden  Procedure(s) Performed: COLOSTOMY REVISION (N/A )  Patient location during evaluation: ICU Anesthesia Type: General Level of consciousness: sedated (Trach per plan) Pain management: pain level controlled Vital Signs Assessment: post-procedure vital signs reviewed and stable Respiratory status: patient on ventilator - see flowsheet for VS and respiratory function stable Cardiovascular status: blood pressure returned to baseline and stable Postop Assessment: no apparent nausea or vomiting Anesthetic complications: no   No complications documented.   Last Vitals:  Vitals:   03/09/21 1200 03/09/21 1330  BP: (!) 143/63 (!) 153/79  Pulse: (!) 51 77  Resp: 16 15  Temp:    SpO2: 97% 97%    Last Pain:  Vitals:   03/09/21 0400  TempSrc: Axillary  PainSc:                  Martha Clan

## 2021-03-09 NOTE — Transfer of Care (Signed)
Immediate Anesthesia Transfer of Care Note  Patient: Desiree Madden  Procedure(s) Performed: COLOSTOMY REVISION (N/A )  Patient Location: PACU and ICU  Anesthesia Type:General  Level of Consciousness: sedated  Airway & Oxygen Therapy: Patient placed on Ventilator (see vital sign flow sheet for setting)  Post-op Assessment: Report given to RN  Post vital signs: stable  Last Vitals:  Vitals Value Taken Time  BP 145/80 03/09/21 1315  Temp    Pulse 80 03/09/21 1319  Resp 15 03/09/21 1319  SpO2 97 % 03/09/21 1319  Vitals shown include unvalidated device data.  Last Pain:  Vitals:   03/09/21 0400  TempSrc: Axillary  PainSc:       Patients Stated Pain Goal: 3 (28/20/60 1561)  Complications: No complications documented.

## 2021-03-09 NOTE — Progress Notes (Signed)
Date of Admission:  02/21/2021 70 y.o. female with history of colovesicle fistula, diverticulitis, hypertension, hypothyroidism, ulcerative colitis, hyperlipidemia presented to [facility] on 02/21/2021 with lower abdominal pain. Labs showed WBC of 17.9, Hb 11, platelet 267, creatinine 1.40, LFTs normal, blood glucose 158 and lactate 3.  Blood cultures were sent.  She had CT abdomen in the ED which showed advanced colonic diverticulosis with pericolonic fat stranding about the proximal sigmoid colon.  This fat stranding was extending to the adjacent urinary bladder. Fluid level in the urinary bladder.  This may be concerning for fistula with pericolonic edema extending to the anterior aspect of the bladder. Large volume colonic stool from the splenic flexure distally was seen suggesting constipation.  There was also chronic but progressive moderately severe T11 compression fracture.  She was diagnosed with diverticulitis and put on Cipro and Flagyl and bowel rest.  She was seen by GI who did not recommend colonoscopy.  Was seen by surgery And because of the constipation which was aggravating the diverticulitis it was recommended to relieve the constipation and continue treating with IV Zosyn before any surgical intervention could be done.  Urine culture on admission had Enterococcus faecalis.  Patient had another CT abdomen on 3/19 and then 3/23 and the latter showed a new pericolonic abscess with possible intramural component but definite extramural component in the left pelvic measuring 8.3 to 4.7 cm.  On 02/28/2021 patient underwent image guided drain placement in the left lower quadrant.  He had multiple organisms from the stool which included Enterococcus avium, Candida albicans, Clostridium, Bacteroides.  She continued on Zosyn Because of persistent leukocytosis in spite of drain placement she was taken for surgery on 03/01/2021.  Had laparotomy and found to have massive edema and chronically scarred sigmoid  colon extending from mid descending colon with redundancy in the right pelvis.  There was gangrenous changes with perforation and feculent peritonitis with thick peel and biofilm of the left lower quadrant.  She underwent sigmoid colectomy with end colostomy, Hartman's procedure and CVF repair .Postop she returned to ICU with severe septic shock and remained intubated.  Started on pressors and also had left femoral central vein catheter and arterial line was placed emergently. She was started on cefepime, metronidazole and vancomycin.  Also had non-STEMI by troponins and was seen by cardiology. Failure to wean off the vent on 03/05/2021. On 03/06/2021 she had severe agitation and still continued on pressors.  I was asked to see the patient for further management.  Today patient was extubated but post extubation she had severe abdominal pain and increased work of breathing and had to be reintubated     ID: Desiree Madden is a 71 y.o. female  Principal Problem:   Diverticulitis Active Problems:   Fistula, bladder   Ulcerative colitis ([place])   Hyperlipidemia   Essential hypertension   Hypothyroidism   Abnormal ECG   CKD (chronic kidney disease), stage III (HCC)   Elevated lactic acid level   LDA PICC Triple-lumen femoral  03/02/2021>>03/06/21 Arterial line left femoral placed on 03/01/2021>>03/06/21 Urethral catheter placed on 03/01/2021 Close system drained right lower quadrant placed on 03/01/2021 Airway 03/01/2021 Colostomy 03/01/2021  Subjective: None available Went for surgery today to revise the colostomy Tracheostomy yesterday  Medications:  . chlorhexidine gluconate (MEDLINE KIT)  15 mL Mouth Rinse BID  . Chlorhexidine Gluconate Cloth  6 each Topical Daily  . docusate  100 mg Per Tube BID  . free water  100 mL Per Tube Q4H  .  insulin aspart  0-15 Units Subcutaneous Q4H  . levothyroxine  75 mcg Per Tube Q0600  . mouth rinse  15 mL Mouth Rinse 10 times per day  . midodrine  10 mg  Per Tube TID WC  . multivitamin with minerals  1 tablet Per Tube Daily  . pantoprazole (PROTONIX) IV  40 mg Intravenous Daily  . polyethylene glycol  17 g Per Tube Daily  . sodium chloride flush  10-40 mL Intracatheter Q12H  . sodium chloride flush  5 mL Intracatheter Q8H    Objective: Vital signs in last 24 hours: Temp:  [97.6 F (36.4 C)-99.1 F (37.3 C)] 98.5 F (36.9 C) (04/01 1130) Pulse Rate:  [40-77] 77 (04/01 1330) Resp:  [0-24] 15 (04/01 1330) BP: (91-153)/(47-79) 153/79 (04/01 1330) SpO2:  [94 %-100 %] 97 % (04/01 1330) FiO2 (%):  [30 %-40 %] 30 % (04/01 1044)  PHYSICAL EXAM:  General:intubated and sedated trcheostomy Lungs: b/l air entry. Heart: Regular rate and rhythm, no murmur, rub or gallop. Abdomen: Soft, non-tender,not distended. Bowel sounds normal. No masses Extremities: atraumatic, no cyanosis. No edema. No clubbing Skin: No rashes or lesions. Or bruising Lymph: Cervical, supraclavicular normal. Neurologic: Grossly non-focal  Lab Results Recent Labs    03/08/21 0531 03/08/21 0702 03/09/21 0453  WBC  --  24.6* 17.8*  HGB  --  8.7* 8.9*  HCT  --  26.8* 27.2*  NA 149*  --  148*  K 3.9  --  3.3*  CL 113*  --  112*  CO2 28  --  30  BUN 44*  --  47*  CREATININE 1.29*  --  1.29*   Liver Panel Recent Labs    03/07/21 1850  PROT 4.7*  ALBUMIN 3.1*  AST 29  ALT 15  ALKPHOS 35*  BILITOT 1.3*    Microbiology: 02/28/21  Intraabdominal culture- enterococcus, canida, clostridium and bacteroides 3/24 Bc NG Studies/Results: DG Abd 1 View  Result Date: 03/08/2021 CLINICAL DATA:  NG tube placement. EXAM: ABDOMEN - 1 VIEW COMPARISON:  03/08/2021. FINDINGS: NG tube tip coiled in the stomach. No gastric or bowel distention. Ostomy noted over the left lower quadrant. Drainage catheter noted left lower quadrant. Prior CABG. IMPRESSION: NG tube noted with tip coiled in the stomach. Electronically Signed   By: Marcello Moores  Register   On: 03/08/2021 14:00   DG  Abd 1 View  Result Date: 03/08/2021 CLINICAL DATA:  Nasogastric tube placement. EXAM: ABDOMEN - 1 VIEW COMPARISON:  None. FINDINGS: The bowel gas pattern is normal. Nasogastric tube tip is seen in proximal stomach. Colostomy is noted in left lower quadrant. No radio-opaque calculi or other significant radiographic abnormality are seen. IMPRESSION: Nasogastric tube tip seen in proximal stomach. No evidence of bowel obstruction or ileus. Electronically Signed   By: Marijo Conception M.D.   On: 03/08/2021 11:04     Assessment/Plan: Acute on chronic diverticulitis, complicated by perforation, colovesical fistula, pelvic abscess, status post drain placement.  Culture polymicrobial  including Enterococcus, Candida, Clostridium, and Bacteroides.  But with worsening leukocytosis went for laparotomy on 03/01/2021 Found to have gangrenous colon, fecal peritonitis, underwent Hartman's procedure colostomy and bladder fistula closure. Vanco cefepime and Flagyl changed to meropenem and  Fluconazole on 03/06/21- continue for a total of 10 days or earlier if leucocytosis resolve Had revision colostomy on 03/09/21   Acute hypoxic respiratory failure remains intubated.  has tracheostomy Anemia  CKD ? Discussed the management with care team Will follow her peripherally this weekend

## 2021-03-10 DIAGNOSIS — J9601 Acute respiratory failure with hypoxia: Secondary | ICD-10-CM | POA: Diagnosis not present

## 2021-03-10 DIAGNOSIS — K5792 Diverticulitis of intestine, part unspecified, without perforation or abscess without bleeding: Secondary | ICD-10-CM | POA: Diagnosis not present

## 2021-03-10 DIAGNOSIS — Z01818 Encounter for other preprocedural examination: Secondary | ICD-10-CM | POA: Diagnosis not present

## 2021-03-10 DIAGNOSIS — R7989 Other specified abnormal findings of blood chemistry: Secondary | ICD-10-CM | POA: Diagnosis not present

## 2021-03-10 LAB — CBC
HCT: 30.5 % — ABNORMAL LOW (ref 36.0–46.0)
Hemoglobin: 9.6 g/dL — ABNORMAL LOW (ref 12.0–15.0)
MCH: 29.5 pg (ref 26.0–34.0)
MCHC: 31.5 g/dL (ref 30.0–36.0)
MCV: 93.8 fL (ref 80.0–100.0)
Platelets: 217 10*3/uL (ref 150–400)
RBC: 3.25 MIL/uL — ABNORMAL LOW (ref 3.87–5.11)
RDW: 16.7 % — ABNORMAL HIGH (ref 11.5–15.5)
WBC: 21.6 10*3/uL — ABNORMAL HIGH (ref 4.0–10.5)
nRBC: 0 % (ref 0.0–0.2)

## 2021-03-10 LAB — MAGNESIUM: Magnesium: 2 mg/dL (ref 1.7–2.4)

## 2021-03-10 LAB — GLUCOSE, CAPILLARY
Glucose-Capillary: 128 mg/dL — ABNORMAL HIGH (ref 70–99)
Glucose-Capillary: 104 mg/dL — ABNORMAL HIGH (ref 70–99)
Glucose-Capillary: 157 mg/dL — ABNORMAL HIGH (ref 70–99)
Glucose-Capillary: 141 mg/dL — ABNORMAL HIGH (ref 70–99)
Glucose-Capillary: 153 mg/dL — ABNORMAL HIGH (ref 70–99)
Glucose-Capillary: 111 mg/dL — ABNORMAL HIGH (ref 70–99)

## 2021-03-10 LAB — BASIC METABOLIC PANEL
Anion gap: 6 (ref 5–15)
BUN: 43 mg/dL — ABNORMAL HIGH (ref 8–23)
CO2: 30 mmol/L (ref 22–32)
Calcium: 7.7 mg/dL — ABNORMAL LOW (ref 8.9–10.3)
Chloride: 109 mmol/L (ref 98–111)
Creatinine, Ser: 0.9 mg/dL (ref 0.44–1.00)
GFR, Estimated: >60 mL/min (ref >60)
Glucose, Bld: 109 mg/dL — ABNORMAL HIGH (ref 70–99)
Potassium: 3.5 mmol/L (ref 3.5–5.1)
Sodium: 145 mmol/L (ref 135–145)

## 2021-03-10 LAB — POTASSIUM: Potassium: 3.8 mmol/L (ref 3.5–5.1)

## 2021-03-10 LAB — PHOSPHORUS: Phosphorus: 2.5 mg/dL (ref 2.5–4.6)

## 2021-03-10 MED ORDER — POTASSIUM CHLORIDE 20 MEQ PO PACK
40.0000 meq | PACK | Freq: Once | ORAL | Status: AC
  Administered 2021-03-10: 40 meq
  Filled 2021-03-10: qty 2

## 2021-03-10 MED ORDER — DEXMEDETOMIDINE HCL IN NACL 400 MCG/100ML IV SOLN: 0.2000 ug/kg/h | INTRAVENOUS | Status: DC

## 2021-03-10 MED ORDER — SODIUM CHLORIDE 0.9 % IV SOLN
1.0000 g | Freq: Three times a day (TID) | INTRAVENOUS | Status: DC
  Administered 2021-03-10 – 2021-03-12 (×6): 1 g via INTRAVENOUS
  Filled 2021-03-10 (×8): qty 1

## 2021-03-10 MED ORDER — DEXMEDETOMIDINE HCL IN NACL 400 MCG/100ML IV SOLN
0.0000 ug/kg/h | INTRAVENOUS | Status: DC
  Administered 2021-03-10: 0.4 ug/kg/h via INTRAVENOUS
  Administered 2021-03-11: 0.6 ug/kg/h via INTRAVENOUS
  Administered 2021-03-11: 0.4 ug/kg/h via INTRAVENOUS
  Administered 2021-03-12: 0.6 ug/kg/h via INTRAVENOUS
  Filled 2021-03-10 (×5): qty 100

## 2021-03-10 NOTE — Progress Notes (Signed)
PHARMACY NOTE:  ANTIMICROBIAL RENAL DOSAGE ADJUSTMENT  Current antimicrobial regimen includes a mismatch between antimicrobial dosage and estimated renal function.  As per policy approved by the Pharmacy & Therapeutics and Medical Executive Committees, the antimicrobial dosage will be adjusted accordingly.  Current antimicrobial dosage:  Meropenem 1 g q12h  Indication: intra-abdominal infection  Renal Function:  Estimated Creatinine Clearance: 54.8 mL/min (by C-G formula based on SCr of 0.9 mg/dL).    Antimicrobial dosage has been changed to:  Meropenem 1 g q8h    Thank you for allowing pharmacy to be a part of this patient's care.  Dorena Bodo, PharmD 03/10/2021 8:55 AM

## 2021-03-10 NOTE — Progress Notes (Addendum)
[facility] for Electrolyte Monitoring and Replacement   Recent Labs: Potassium (mmol/L)  Date Value  03/10/2021 3.5   Magnesium (mg/dL)  Date Value  03/10/2021 2.0   Calcium (mg/dL)  Date Value  03/10/2021 7.7 (L)   Albumin (g/dL)  Date Value  03/07/2021 3.1 (L)   Phosphorus (mg/dL)  Date Value  03/10/2021 2.5   Sodium (mmol/L)  Date Value  03/10/2021 145   Corrected Ca: 8.4  Assessment: Desiree Madden is a 70 y.o. female admitted on 02/21/2021 with intra-abdominal infection (diverticulitis w/ fecal perforation). Patient septic with acute diverticulitis abdominal abscess with colovesical fistula and enterococcus urinary tract infection which was further complicated by the development of complicated diverticulitis with perforation and gangrenous changes requiring Hartman's procedure performed on 03/01/21. Pt developed severe septic shock after procedure requiring vasopressors. Additionally, pt has metabolic acidosis. PMH includes CAD s/p CABG (2015), HTN, hypothyroidism, colon bladder fistula, diverticulitis, and ulcerative colitis. Pharmacy has been consulted for electrolyte monitoring.  Patient was extubated and re-intubated 3/29  3/30: Furosemide 40 mg IV x 1 dose, metolazone 2.5 mg PO x 1 dose  Nutrition: Continous tube feeds restarted 3/30 @ 55 mL/hr  Hypernatremia resolved Na = 145, Hypokalemia resolving K = 3.5, Scr improving 0.90  Goal of Therapy:  Electrolytes WNL K: 4   Plan:  - K 3.5 mild low - repleted with 66meq po KCL -  Other electrolytes WNL's (no further replenishment warranted) -- F/u electrolytes with AM labs  Lu Duffel, PharmD, BCPS Clinical Pharmacist 03/10/2021 9:32 AM

## 2021-03-10 NOTE — Progress Notes (Addendum)
NAME:  Desiree Madden, MRN:  [id number], DOB:  [birth year], LOS: 2 ADMISSION DATE:  02/21/2021   Brief Pt Description:  70 y.o. Female admitted 02/21/2021 due to Diverticulitis with Colovesical fistula and UTI. On 03/01/21 she developed complicated Diverticulitis with perforation requiring Sigmoid colectomy with end colostomy and Hartman's procedure. Post procedure she returns to ICU and remains intubated with Septic Shock requiring multiple vasopressors.    Significant Hospital Events: Including procedures, antibiotic start and stop dates in addition to other pertinent events    02/21/2021:Admitted to Med/Surg unit with Diverticulitis and UTI  02/28/2021:LLQ abdominal drain placed by IR to pelvic abscess  03/01/2021:Worsening Leukocytosis despite drain placement. Found to have complicated Diverticulitis with perforation requiring Sigmoid Colectomy with end colostomy and Hartman's Procedure. Post procedure she returns to ICU and remains intubated with Septic Shock requiring multiple vasopressors. Left Femoral CVC and Arterial lines placed emergently  03/04/2021:Severe anasarca, albumin 1.8 DX of NSTEMI  3/28 failure to wean from vent  3/29 severe agitation, septic shock on pressors  3/29 failed extubation, re intubated daughter updated  3/30 ENT consulted  3/31 [facility] placed, daughter updated, made DNR status  4/1 colostomy revision, remains on vent, daughter updated     Cultures:  02/21/21: SARS CoV-2 PCR>>negative 02/21/21: Influenza PCR>>negative 02/21/2021: Urine>>ENTEROCOCCUS FAECALIS, ENTEROCOCCUS FAECIUM(sensitive to vancomycin and ampicillin) 02/22/2021: HIV screen>> nonreactive 02/28/2021: LLQ Abdominal drain>>Enterococcusavium, other polymicrobial Candida albicans 03/01/2021: MRSA PCR>> negative 03/01/2021: Blood culture x2>>  Antimicrobials:  Ciprofloxacin 3/16>>3/18 Unasyn 3/23>>3/23 Flagyl 3/16>>3/18; 3/24>>3/29 Cefepime 3/24>>3/29 Vancomycin  3/24>>3/29 UNASYN 3/29-->    Interim History / Subjective:  Remains intubated Sedated On vent Failure to wean Plan for SAT/SBT today       Objective   Blood pressure 119/61, pulse 60, temperature 97.9 F (36.6 C), temperature source Axillary, resp. rate 15, height 5' 4.02" (1.626 m), weight 67.2 kg, SpO2 100 %.    Vent Mode: PRVC FiO2 (%):  [30 %-70 %] 35 % Set Rate:  [15 bmp] 15 bmp Vt Set:  [450 mL] 450 mL PEEP:  [5 cmH20-10 cmH20] 10 cmH20 Plateau Pressure:  [12 cmH20-14 cmH20] 12 cmH20   Intake/Output Summary (Last 24 hours) at 03/10/2021 0758 Last data filed at 03/10/2021 6160 Gross per 24 hour  Intake 0 ml  Output 2895 ml  Net -2895 ml   Filed Weights   02/27/21 0900 02/28/21 1014 03/01/21 1511  Weight: 66.9 kg 67.2 kg 67.2 kg      REVIEW OF SYSTEMS  PATIENT IS UNABLE TO PROVIDE COMPLETE REVIEW OF SYSTEMS DUE TO SEVERE CRITICAL ILLNESS AND TOXIC METABOLIC ENCEPHALOPATHY   PHYSICAL EXAMINATION:  GENERAL:critically ill appearing, +resp distress HEAD: Normocephalic, atraumatic.  EYES: Pupils equal, round, reactive to light.  No scleral icterus.  MOUTH: Moist mucosal membrane. NECK: Supple. No thyromegaly. No nodules. No JVD.  PULMONARY: +rhonchi, +wheezing CARDIOVASCULAR: S1 and S2. Regular rate and rhythm. No murmurs, rubs, or gallops.  GASTROINTESTINAL: revised colostomy bandages in place MUSCULOSKELETAL: +edema.  NEUROLOGIC: obtunded SKIN:intact,warm,dry   Labs/imaging that I havepersonally reviewed  (right click and "Reselect all SmartList Selections" daily)      ASSESSMENT AND PLAN SYNOPSIS  70 yo with acute and severe septic shock with hemorrhagic shock with acute hypoxic resp failure with septic shock and NSTEMI due to complicated Complicated Diverticulitis/Pelvic Abscess with Perforation &Gangrene of the Sigmoid Colon, Colovesical Fistula failed trial of extubation and re-intubated 3/29 Trach on 3/31   Severe ACUTE Hypoxic and  Hypercapnic Respiratory Failure -continue Mechanical Ventilator support -continue Bronchodilator Therapy -  Wean Fio2 and PEEP as tolerated -VAP/VENT bundle implementation -will perform SAT/SBT when respiratory parameters are met Will wean sedation and PS mode when daughter arrives to bedside   CARDIAC FAILURE-acute systolic NSTEMI -oxygen as needed -Lasix as tolerated -follow up cardiac enzymes as indicated (Concern for basal to mid inferior wall  hypokinesis).  CARDIAC ICU monitoring   ACUTE KIDNEY INJURY/Renal Failure -continue Foley Catheter-assess need -Avoid nephrotoxic agents -Follow urine output, BMP -Ensure adequate renal perfusion, optimize oxygenation -Renal dose medications   NEUROLOGY Acute toxic metabolic encephalopathy, need for sedation Goal RASS -2 to -3   SEPTIC SHOCK -use vasopressors to keep MAP>65 as needed +CANDIDA ALBICANS +ENTEROCOCCUS AVIUM +CLOSTRIDIUM +BACTEROIDES   INFECTIOUS DISEASE -continue antibiotics as prescribed -follow up cultures -follow up ID consultation  ENDO - ICU hypoglycemic\Hyperglycemia protocol -check FSBS per protocol   GI GI PROPHYLAXIS as indicated  NUTRITIONAL STATUS DIET-->TF's as tolerated Constipation protocol as indicated   ELECTROLYTES -follow labs as needed -replace as needed -pharmacy consultation and following     Best practice (right click and "Reselect all SmartList Selections" daily)  Diet:  NPO Pain/Anxiety/Delirium protocol (if indicated): Yes (RASS goal -1) VAP protocol (if indicated): Yes DVT prophylaxis: SCD GI prophylaxis: PPI Glucose control:  SSI Yes Central venous access:  Yes, and it is still needed Arterial line:  N/A Foley:  N/A Mobility:  bed rest  PT consulted: N/A Code Status:  DNR    Labs   CBC: Recent Labs  Lab 03/06/21 0452 03/07/21 0347 03/08/21 0702 03/09/21 0453 03/10/21 0559  WBC 19.1* 21.9* 24.6* 17.8* 21.6*  HGB 8.3* 7.6* 8.7* 8.9* 9.6*   HCT 24.9* 23.4* 26.8* 27.2* 30.5*  MCV 89.2 91.4 92.1 92.8 93.8  PLT 142* 148* 187 175 300    Basic Metabolic Panel: Recent Labs  Lab 03/05/21 0500 03/05/21 1535 03/06/21 0452 03/07/21 0347 03/07/21 1332 03/07/21 1850 03/08/21 0531 03/09/21 0453 03/10/21 0559  NA 143  --  146* 147* 148* 148* 149* 148* 145  K 2.9*   < > 3.4* 2.6* 3.1* 3.3* 3.9 3.3* 3.5  CL 108  --  112* 116* 117* 114* 113* 112* 109  CO2 27  --  _0 GLUCOSE 188*  --  202* 153* 121* 126* 90 178* 109*  BUN 45*  --  45* 45* 44* 44* 44* 47* 43*  CREATININE 1.20*  --  1.18* 0.92 0.96 1.01* 1.29* 1.29* 0.90  CALCIUM 7.3*  --  7.8* 7.3* 7.8* 7.5* 8.5* 8.0* 7.7*  MG 1.9  --  2.3 1.9  --  1.7  --  1.8 2.0  PHOS 2.9  --  3.2  --   --  2.6  --  2.5 2.5   < > = values in this interval not displayed.   GFR: Estimated Creatinine Clearance: 54.8 mL/min (by C-G formula based on SCr of 0.9 mg/dL). Recent Labs  Lab 03/05/21 1105 03/06/21 0452 03/07/21 0347 03/08/21 0702 03/09/21 0453 03/10/21 0559  WBC  --    < > 21.9* 24.6* 17.8* 21.6*  LATICACIDVEN 1.1  --   --   --   --   --    < > = values in this interval not displayed.    Liver Function Tests: Recent Labs  Lab 03/04/21 0454 03/05/21 0500 03/06/21 0452 03/07/21 1850  AST  --   --   --  29  ALT  --   --   --  15  ALKPHOS  --   --   --  35*  BILITOT  --   --   --  1.3*  PROT  --   --   --  4.7*  ALBUMIN 1.8* 2.3* 2.9* 3.1*   No results for input(s): LIPASE, AMYLASE in the last 168 hours. No results for input(s): AMMONIA in the last 168 hours.  ABG    Component Value Date/Time   PHART 7.35 03/06/2021 1811   PCO2ART 46 03/06/2021 1811   PO2ART 100 03/06/2021 1811   HCO3 25.4 03/06/2021 1811   ACIDBASEDEF 0.4 03/06/2021 1811   O2SAT 97.4 03/06/2021 1811     Coagulation Profile: No results for input(s): INR, PROTIME in the last 168 hours.  Cardiac Enzymes: No results for input(s): CKTOTAL, CKMB, CKMBINDEX, TROPONINI in the  last 168 hours.  HbA1C: No results found for: HGBA1C  CBG: Recent Labs  Lab 03/09/21 1931 03/09/21 2012 03/09/21 2338 03/10/21 0400 03/10/21 0733  GLUCAP 137* 126* 155* 128* 104*    Allergies Allergies  Allergen Reactions  . Baclofen     Other reaction(s): Headache  . Sertraline     Other reaction(s): Other (See Comments), Other (See Comments) Muscle aches Muscle aches   . Sulfa Antibiotics     Other reaction(s): Other (See Comments), Other (See Comments) Pt was told not to take it due to colitis Pt was told not to take it due to colitis   . Ibuprofen Nausea And Vomiting  . Trazodone And Nefazodone        DVT/GI PRX  assessed I Assessed the need for Labs I Assessed the need for Foley I Assessed the need for Central Venous Line Family Discussion when available I Assessed the need for Mobilization I made an Assessment of medications to be adjusted accordingly Safety Risk assessment completed  CASE DISCUSSED IN MULTIDISCIPLINARY ROUNDS WITH ICU TEAM     Critical Care Time devoted to patient care services described in this note is 54 minutes.   Overall, patient is critically ill, prognosis is guarded.  Patient with Multiorgan failure and at high risk for cardiac arrest and death.   Patient is DNR status, prognosis is poor  Corrin Parker, M.D.  [facility]  Medical Director [place] Director [facility] Cardio-Pulmonary Department

## 2021-03-10 NOTE — Plan of Care (Signed)
Pt weaned off Fentanyl and versed gtt, continued on Neo gtt, continued on full vent support. Pt waking up slowly but heart rate will go with any activities and goes back down when left alone. Pt daughter has been at bedside all day and was very helpful to keep her calm during weaning off sedation. Pt tolerating being off sedation for now. VSS, afebrile all day. Pt care plan reviewed with daughter.

## 2021-03-10 NOTE — Progress Notes (Signed)
GOALS OF CARE DISCUSSION  The Clinical status was relayed to family in detail. Daughter at bedside  Updated and notified of patients medical condition.  Desiree Madden remains unresponsive and will not open eyes to command.    Desiree Madden is having a weak cough and struggling to remove secretions.   Desiree Madden with increased WOB and using accessory muscles to breathe Explained to family course of therapy and the modalities    Desiree Madden REMAINS DNR STATUS  Family understands the situation.  Plan for weaning off sedation Wean vent as tolerated while daughter at bedside  Family are satisfied with Plan of action and management. All questions answered  Additional CC time 35 mins   Yerania Chamorro Patricia Pesa, M.D.  [facility]  Medical Director [place] Director [facility] Cardio-Pulmonary Department

## 2021-03-10 NOTE — Progress Notes (Addendum)
Subjective:  CC: Desiree Madden is a 70 y.o. female  Hospital stay day 17, 1 Day Post-Op ostomy revisions, s/p hartman's  HPI: No acute issues overnight.  Weaning sedation and pressors this am.  ROS:  Unable to obtain secondary to patient mental status  Objective:   Temp:  [97.9 F (36.6 C)-98.7 F (37.1 C)] 98 F (36.7 C) (04/02 0400) Pulse Rate:  [45-90] 66 (04/02 0700) Resp:  [13-20] 15 (04/02 0700) BP: (84-153)/(50-79) 103/61 (04/02 0700) SpO2:  [88 %-100 %] 100 % (04/02 0757) FiO2 (%):  [30 %-70 %] 35 % (04/02 0757)     Height: 5' 4.02" (162.6 cm) Weight: 67.2 kg BMI (Calculated): 25.42   Intake/Output this shift:   Intake/Output Summary (Last 24 hours) at 03/10/2021 1021 Last data filed at 03/10/2021 0931 Gross per 24 hour  Intake 3446.99 ml  Output 3195 ml  Net 251.99 ml    Constitutional :  sedated and trached  Respiratory:  clear to auscultation bilaterally  Cardiovascular:  regular rate and rhythm  Gastrointestinal: soft, non-tender; bowel sounds normal; no masses,  no organomegaly.  Ostomy, swollen, but pink, only bowel sweat  Skin: Cool and moist. Incision with wick in place, some exudative drainage around wicks.  Psychiatric: Normal affect, non-agitated, not confused       LABS:  CMP Latest Ref Rng & Units 03/10/2021 03/09/2021 03/08/2021  Glucose 70 - 99 mg/dL 109(H) 178(H) 90  BUN 8 - 23 mg/dL 43(H) 47(H) 44(H)  Creatinine 0.44 - 1.00 mg/dL 0.90 1.29(H) 1.29(H)  Sodium 135 - 145 mmol/L 145 148(H) 149(H)  Potassium 3.5 - 5.1 mmol/L 3.5 3.3(L) 3.9  Chloride 98 - 111 mmol/L 109 112(H) 113(H)  CO2 22 - 32 mmol/L $RemoveB'30 30 28  '[id number]$ Calcium 8.9 - 10.3 mg/dL 7.7(L) 8.0(L) 8.5(L)  Total Protein 6.5 - 8.1 g/dL - - -  Total Bilirubin 0.3 - 1.2 mg/dL - - -  Alkaline Phos 38 - 126 U/L - - -  AST 15 - 41 U/L - - -  ALT 0 - 44 U/L - - -   CBC Latest Ref Rng & Units 03/10/2021 03/09/2021 03/08/2021  WBC 4.0 - 10.5 K/uL 21.6(H) 17.8(H) 24.6(H)  Hemoglobin 12.0 - 15.0 g/dL 9.6(L)  8.9(L) 8.7(L)  Hematocrit 36.0 - 46.0 % 30.5(L) 27.2(L) 26.8(L)  Platelets 150 - 400 K/uL 217 175 187    RADS: n/a Assessment:   S/p hartmans, then ostomy revision yesterday.  Stable from surgery standpoint.  Abdomen not distended, seems to be tolerating tube feeds, so continue current rate for now.  Await return of bowel function and continue to monitor midline wound. Continue abx per ID.  Further care per ICU team.

## 2021-03-11 DIAGNOSIS — K5792 Diverticulitis of intestine, part unspecified, without perforation or abscess without bleeding: Secondary | ICD-10-CM | POA: Diagnosis not present

## 2021-03-11 DIAGNOSIS — J9601 Acute respiratory failure with hypoxia: Secondary | ICD-10-CM | POA: Diagnosis not present

## 2021-03-11 LAB — PHOSPHORUS: Phosphorus: 2.1 mg/dL — ABNORMAL LOW (ref 2.5–4.6)

## 2021-03-11 LAB — BASIC METABOLIC PANEL
Anion gap: 6 (ref 5–15)
BUN: 41 mg/dL — ABNORMAL HIGH (ref 8–23)
CO2: 27 mmol/L (ref 22–32)
Calcium: 7.4 mg/dL — ABNORMAL LOW (ref 8.9–10.3)
Chloride: 111 mmol/L (ref 98–111)
Creatinine, Ser: 0.87 mg/dL (ref 0.44–1.00)
GFR, Estimated: >60 mL/min (ref >60)
Glucose, Bld: 182 mg/dL — ABNORMAL HIGH (ref 70–99)
Potassium: 4.6 mmol/L (ref 3.5–5.1)
Sodium: 144 mmol/L (ref 135–145)

## 2021-03-11 LAB — CBC
HCT: 30.9 % — ABNORMAL LOW (ref 36.0–46.0)
Hemoglobin: 9.7 g/dL — ABNORMAL LOW (ref 12.0–15.0)
MCH: 29.8 pg (ref 26.0–34.0)
MCHC: 31.4 g/dL (ref 30.0–36.0)
MCV: 94.8 fL (ref 80.0–100.0)
Platelets: 196 10*3/uL (ref 150–400)
RBC: 3.26 MIL/uL — ABNORMAL LOW (ref 3.87–5.11)
RDW: 16.6 % — ABNORMAL HIGH (ref 11.5–15.5)
WBC: 18.7 10*3/uL — ABNORMAL HIGH (ref 4.0–10.5)
nRBC: 0 % (ref 0.0–0.2)

## 2021-03-11 LAB — GLUCOSE, CAPILLARY
Glucose-Capillary: 163 mg/dL — ABNORMAL HIGH (ref 70–99)
Glucose-Capillary: 292 mg/dL — ABNORMAL HIGH (ref 70–99)
Glucose-Capillary: 296 mg/dL — ABNORMAL HIGH (ref 70–99)
Glucose-Capillary: 97 mg/dL (ref 70–99)

## 2021-03-11 LAB — MAGNESIUM: Magnesium: 1.7 mg/dL (ref 1.7–2.4)

## 2021-03-11 MED ORDER — VASOPRESSIN 20 UNITS/100 ML INFUSION FOR SHOCK
0.0000 [IU]/min | INTRAVENOUS | Status: DC
  Administered 2021-03-11: 0.03 [IU]/min via INTRAVENOUS
  Administered 2021-03-12: 0.01 [IU]/min via INTRAVENOUS
  Filled 2021-03-11 (×2): qty 100

## 2021-03-11 MED ORDER — MAGNESIUM SULFATE 2 GM/50ML IV SOLN
2.0000 g | Freq: Once | INTRAVENOUS | Status: AC
  Administered 2021-03-11: 2 g via INTRAVENOUS
  Filled 2021-03-11: qty 50

## 2021-03-11 MED ORDER — HYDROCORTISONE NA SUCCINATE PF 100 MG IJ SOLR
50.0000 mg | Freq: Four times a day (QID) | INTRAMUSCULAR | Status: DC
  Administered 2021-03-11 – 2021-03-13 (×7): 50 mg via INTRAVENOUS
  Filled 2021-03-11 (×7): qty 2

## 2021-03-11 NOTE — Plan of Care (Signed)
  Problem: Clinical Measurements: Goal: Respiratory complications will improve Outcome: Progressing Goal: Cardiovascular complication will be avoided Outcome: Progressing   Problem: Activity: Goal: Risk for activity intolerance will decrease Outcome: Progressing   Problem: Nutrition: Goal: Adequate nutrition will be maintained Outcome: Progressing   Problem: Elimination: Goal: Will not experience complications related to bowel motility Outcome: Progressing   Problem: Pain Managment: Goal: General experience of comfort will improve Outcome: Progressing   Problem: Safety: Goal: Ability to remain free from injury will improve Outcome: Progressing

## 2021-03-11 NOTE — Progress Notes (Signed)
NAME:  Desiree Madden, MRN:  [id number], DOB:  1951-12-01, LOS: 18 ADMISSION DATE:  02/21/2021  BRIEF SYNOPSIS  Brief Pt Description:  70 y.o. Female admitted 02/21/2021 due to Diverticulitis with Colovesical fistula and UTI. On 03/01/21 she developed complicated Diverticulitis with perforation requiring Sigmoid colectomy with end colostomy and Hartman's procedure. Post procedure she returns to ICU and remains intubated with Septic Shock requiring multiple vasopressors.    Significant Hospital Events: Including procedures, antibiotic start and stop dates in addition to other pertinent events    02/21/2021:Admitted to Med/Surg unit with Diverticulitis and UTI  02/28/2021:LLQ abdominal drain placed by IR to pelvic abscess  03/01/2021:Worsening Leukocytosis despite drain placement. Found to have complicated Diverticulitis with perforation requiring Sigmoid Colectomy with end colostomy and Hartman's Procedure. Post procedure she returns to ICU and remains intubated with Septic Shock requiring multiple vasopressors. Left Femoral CVC and Arterial lines placed emergently  03/04/2021:Severe anasarca, albumin 1.8 DX of NSTEMI  3/28 failure to wean from vent  3/29 severe agitation, septic shock on pressors  3/29 failed extubation, re intubated daughter updated  3/30 ENT consulted  3/31 [facility] placed, daughter updated, made DNR status  4/1 colostomy revision, remains on vent, daughter updated  4/2 failed weaning trials      Cultures:  02/21/21: SARS CoV-2 PCR>>negative 02/21/21: Influenza PCR>>negative 02/21/2021: Urine>>ENTEROCOCCUS FAECALIS, ENTEROCOCCUS FAECIUM(sensitive to vancomycin and ampicillin) 02/22/2021: HIV screen>> nonreactive 02/28/2021: LLQ Abdominal drain>>Enterococcusavium, other polymicrobial Candida albicans 03/01/2021: MRSA PCR>> negative 03/01/2021: Blood culture x2>>  Antimicrobials:  Ciprofloxacin 3/16>>3/18 Unasyn 3/23>>3/23 Flagyl 3/16>>3/18;  3/24>>3/29 Cefepime 3/24>>3/29 Vancomycin 3/24>>3/29 UNASYN 3/29-->      Interim History / Subjective:  Remains intubated On vent S/p trach Revised colostomy On precedex    Objective   Blood pressure 94/66, pulse 94, temperature 98.2 F (36.8 C), temperature source Oral, resp. rate 19, height 5' 4.02" (1.626 m), weight 67.2 kg, SpO2 100 %.    Vent Mode: PRVC FiO2 (%):  [35 %] 35 % Set Rate:  [15 bmp] 15 bmp Vt Set:  [450 mL] 450 mL PEEP:  [5 cmH20] 5 cmH20 Plateau Pressure:  [13 cmH20-14 cmH20] 13 cmH20   Intake/Output Summary (Last 24 hours) at 03/11/2021 0730 Last data filed at 03/11/2021 0700 Gross per 24 hour  Intake 4723.76 ml  Output 2900 ml  Net 1823.76 ml   Filed Weights   02/27/21 0900 02/28/21 1014 03/01/21 1511  Weight: 66.9 kg 67.2 kg 67.2 kg      REVIEW OF SYSTEMS  PATIENT IS UNABLE TO PROVIDE COMPLETE REVIEW OF SYSTEMS DUE TO SEVERE CRITICAL ILLNESS AND TOXIC METABOLIC ENCEPHALOPATHY   PHYSICAL EXAMINATION:  GENERAL:critically ill appearing, +resp distress HEAD: Normocephalic, atraumatic.  EYES: Pupils equal, round, reactive to light.  No scleral icterus.  MOUTH: Moist mucosal membrane. NECK: Supple.s/p t rach PULMONARY: +rhonchi, +wheezing CARDIOVASCULAR: S1 and S2. Regular rate and rhythm. No murmurs, rubs, or gallops.  GASTROINTESTINAL: colostomy +distention  MUSCULOSKELETAL: +edema.  NEUROLOGIC: obtunded SKIN:intact,warm,dry   Labs/imaging that I havepersonally reviewed  (right click and "Reselect all SmartList Selections" daily)    Resolved Hospital Problem list   na    ASSESSMENT AND PLAN SYNOPSIS  70 yo with acute and severe septic shock with hemorrhagic shock with acute hypoxic resp failure with septic shock and NSTEMI due to complicated Complicated Diverticulitis/Pelvic Abscess with Perforation &Gangrene of the Sigmoid Colon, Colovesical Fistula failed trial of extubation and re-intubated 3/29 Trach on 3/31   Severe  ACUTE Hypoxic and Hypercapnic Respiratory Failure -continue Mechanical Ventilator  support -continue Bronchodilator Therapy -Wean Fio2 and PEEP as tolerated -VAP/VENT bundle implementation Failure to wean from vent Will wean sedation and PS mode when daughter arrives to bedside  CARDIAC FAILURE-acute  systolic dysfunction NSTEMI -oxygen as needed -Lasix as tolerated -follow up cardiac enzymes as indicated (Concern for basal to mid inferior wall  hypokinesis).    CARDIAC ICU monitoring   ACUTE KIDNEY INJURY/Renal Failure -continue Foley Catheter-assess need -Avoid nephrotoxic agents -Follow urine output, BMP -Ensure adequate renal perfusion, optimize oxygenation -Renal dose medications   NEUROLOGY Acute toxic metabolic encephalopathy Wean sedation started on precedex   SEPTIC SHOCK -use vasopressors to keep MAP>65 as needed   INFECTIOUS DISEASE -continue antibiotics as prescribed -follow up cultures -follow up ID consultation +CANDIDA ALBICANS +ENTEROCOCCUS AVIUM +CLOSTRIDIUM +BACTEROIDES   ENDO - ICU hypoglycemic\Hyperglycemia protocol -check FSBS per protocol   GI GI PROPHYLAXIS as indicated  NUTRITIONAL STATUS DIET-->TF's as tolerated Constipation protocol as indicated   ELECTROLYTES -follow labs as needed -replace as needed -pharmacy consultation and following     Best practice (right click and "Reselect all SmartList Selections" daily)  Diet: NPO Pain/Anxiety/Delirium protocol (if indicated): Yes (RASS goal -1) VAP protocol (if indicated): Yes DVT prophylaxis: SCD GI prophylaxis: PPI Glucose control: SSI Yes Central venous access: Yes, and it is still needed Arterial line: N/A Foley: N/A Mobility: bed rest PT consulted: N/A Code Status:DNR     Labs   CBC: Recent Labs  Lab 03/07/21 0347 03/08/21 0702 03/09/21 0453 03/10/21 0559 03/11/21 0357  WBC 21.9* 24.6* 17.8* 21.6* 18.7*  HGB 7.6* 8.7* 8.9* 9.6* 9.7*  HCT  23.4* 26.8* 27.2* 30.5* 30.9*  MCV 91.4 92.1 92.8 93.8 94.8  PLT 148* 187 175 217 572    Basic Metabolic Panel: Recent Labs  Lab 03/06/21 0452 03/07/21 0347 03/07/21 1332 03/07/21 1850 03/08/21 0531 03/09/21 0453 03/10/21 0559 03/10/21 2045 03/11/21 0357  NA 146* 147*   < > 148* 149* 148* 145  --  144  K 3.4* 2.6*   < > 3.3* 3.9 3.3* 3.5 3.8 4.6  CL 112* 116*   < > 114* 113* 112* 109  --  111  CO2 28 25   < > $R'25 28 30 30  'Yj$ --  27  GLUCOSE 202* 153*   < > 126* 90 178* 109*  --  182*  BUN 45* 45*   < > 44* 44* 47* 43*  --  41*  CREATININE 1.18* 0.92   < > 1.01* 1.29* 1.29* 0.90  --  0.87  CALCIUM 7.8* 7.3*   < > 7.5* 8.5* 8.0* 7.7*  --  7.4*  MG 2.3 1.9  --  1.7  --  1.8 2.0  --  1.7  PHOS 3.2  --   --  2.6  --  2.5 2.5  --  2.1*   < > = values in this interval not displayed.   GFR: Estimated Creatinine Clearance: 56.7 mL/min (by C-G formula based on SCr of 0.87 mg/dL). Recent Labs  Lab 03/05/21 1105 03/06/21 0452 03/08/21 0702 03/09/21 0453 03/10/21 0559 03/11/21 0357  WBC  --    < > 24.6* 17.8* 21.6* 18.7*  LATICACIDVEN 1.1  --   --   --   --   --    < > = values in this interval not displayed.    Liver Function Tests: Recent Labs  Lab 03/05/21 0500 03/06/21 0452 03/07/21 1850  AST  --   --  29  ALT  --   --  15  ALKPHOS  --   --  35*  BILITOT  --   --  1.3*  PROT  --   --  4.7*  ALBUMIN 2.3* 2.9* 3.1*   No results for input(s): LIPASE, AMYLASE in the last 168 hours. No results for input(s): AMMONIA in the last 168 hours.  ABG    Component Value Date/Time   PHART 7.35 03/06/2021 1811   PCO2ART 46 03/06/2021 1811   PO2ART 100 03/06/2021 1811   HCO3 25.4 03/06/2021 1811   ACIDBASEDEF 0.4 03/06/2021 1811   O2SAT 97.4 03/06/2021 1811     Coagulation Profile: No results for input(s): INR, PROTIME in the last 168 hours.  Cardiac Enzymes: No results for input(s): CKTOTAL, CKMB, CKMBINDEX, TROPONINI in the last 168 hours.  HbA1C: No results found  for: HGBA1C  CBG: Recent Labs  Lab 03/10/21 0733 03/10/21 1130 03/10/21 1550 03/10/21 1922 03/10/21 2310  GLUCAP 104* 157* 141* 153* 111*    Allergies Allergies  Allergen Reactions  . Baclofen     Other reaction(s): Headache  . Sertraline     Other reaction(s): Other (See Comments), Other (See Comments) Muscle aches Muscle aches   . Sulfa Antibiotics     Other reaction(s): Other (See Comments), Other (See Comments) Pt was told not to take it due to colitis Pt was told not to take it due to colitis   . Ibuprofen Nausea And Vomiting  . Trazodone And Nefazodone        DVT/GI PRX  assessed I Assessed the need for Labs I Assessed the need for Foley I Assessed the need for Central Venous Line Family Discussion when available I Assessed the need for Mobilization I made an Assessment of medications to be adjusted accordingly Safety Risk assessment completed  CASE DISCUSSED IN MULTIDISCIPLINARY ROUNDS WITH ICU TEAM     Critical Care Time devoted to patient care services described in this note is 51 minutes.   Overall, patient is critically ill, prognosis is guarded.  Patient with Multiorgan failure and at high risk for cardiac arrest and death.   DNR status, poor prognosis  Warden Buffa Patricia Pesa, M.D.  [facility]  Medical Director [place] Director [place] Department

## 2021-03-11 NOTE — Progress Notes (Signed)
Per Dr. Mortimer Fries maintain patient off of precedex. Patients heart rate mid 130's, highest 144. Patient is opening eyes and following simple commands. MD into speak with daughter. Continue to assess.

## 2021-03-11 NOTE — Progress Notes (Signed)
Patient weaned from precedex. Patient able to follow simple commands, squeezes with bilateral hands, wiggles bilateral toes, and opens eyes to command. Patients heart rate elevated during weaning. Restarted precedex and pain medication given this afternoon for patient comfort. Titrated neo off, once vasopressin started. Tolerating tube feeds, foley intact with adequate urine output. Colostomy bag changed. Continue to assess.

## 2021-03-11 NOTE — Progress Notes (Signed)
Subjective:  CC: Desiree Madden is a 70 y.o. female  Hospital stay day 18, 2 Days Post-Op ostomy revisions, s/p hartman's  HPI: No acute issues overnight.  Continuing to Wean sedation and pressors this am.  ROS:  Unable to obtain secondary to patient mental status  Objective:   Temp:  [98.2 F (36.8 C)-100.2 F (37.9 C)] 100.2 F (37.9 C) (04/03 1630) Pulse Rate:  [71-140] 128 (04/03 1800) Resp:  [15-25] 24 (04/03 1800) BP: (85-138)/(49-103) 117/76 (04/03 1800) SpO2:  [94 %-100 %] 95 % (04/03 1800) FiO2 (%):  [30 %-35 %] 30 % (04/03 1442)     Height: 5' 4.02" (162.6 cm) Weight: 67.2 kg BMI (Calculated): 25.42   Intake/Output this shift:   Intake/Output Summary (Last 24 hours) at 03/11/2021 1857 Last data filed at 03/11/2021 1700 Gross per 24 hour  Intake 2374.31 ml  Output 3895 ml  Net -1520.69 ml    Constitutional :  sedated and trached  Respiratory:  clear to auscultation bilaterally  Cardiovascular:  regular rate and rhythm  Gastrointestinal: soft, non-tender; bowel sounds normal; no masses,  no organomegaly.  Ostomy, swollen, but pink, only bowel sweat  Skin: Cool and moist. Incision with wick in place, purulent drainage mixed with stool contamination.  Psychiatric: Normal affect, non-agitated, not confused       LABS:  CMP Latest Ref Rng & Units 03/11/2021 03/10/2021 03/10/2021  Glucose 70 - 99 mg/dL 182(H) - 109(H)  BUN 8 - 23 mg/dL 41(H) - 43(H)  Creatinine 0.44 - 1.00 mg/dL 0.87 - 0.90  Sodium 135 - 145 mmol/L 144 - 145  Potassium 3.5 - 5.1 mmol/L 4.6 3.8 3.5  Chloride 98 - 111 mmol/L 111 - 109  CO2 22 - 32 mmol/L 27 - 30  Calcium 8.9 - 10.3 mg/dL 7.4(L) - 7.7(L)  Total Protein 6.5 - 8.1 g/dL - - -  Total Bilirubin 0.3 - 1.2 mg/dL - - -  Alkaline Phos 38 - 126 U/L - - -  AST 15 - 41 U/L - - -  ALT 0 - 44 U/L - - -   CBC Latest Ref Rng & Units 03/11/2021 03/10/2021 03/09/2021  WBC 4.0 - 10.5 K/uL 18.7(H) 21.6(H) 17.8(H)  Hemoglobin 12.0 - 15.0 g/dL 9.7(L) 9.6(L)  8.9(L)  Hematocrit 36.0 - 46.0 % 30.9(L) 30.5(L) 27.2(L)  Platelets 150 - 400 K/uL 196 217 175    RADS: n/a Assessment:   S/p hartmans, then ostomy revision yesterday.  Ostomy productive now, with some leakage around ostomy into midline wound now.  Stable from surgery standpoint.  Midline team incision cleaned, wick exchanged, purulent all drained and  ostomy appliance replaced.  Will need to continue to monitor closely for infection

## 2021-03-11 NOTE — Progress Notes (Signed)
[place] for Electrolyte Monitoring and Replacement   Recent Labs: Potassium (mmol/L)  Date Value  03/11/2021 4.6   Magnesium (mg/dL)  Date Value  03/11/2021 1.7   Calcium (mg/dL)  Date Value  03/11/2021 7.4 (L)   Albumin (g/dL)  Date Value  03/07/2021 3.1 (L)   Phosphorus (mg/dL)  Date Value  03/11/2021 2.1 (L)   Sodium (mmol/L)  Date Value  03/11/2021 144   Corrected Ca: ~ 8.1  Assessment: Desiree Madden is a 70 y.o. female admitted on 02/21/2021 with intra-abdominal infection (diverticulitis w/ fecal perforation). Patient septic with acute diverticulitis abdominal abscess with colovesical fistula and enterococcus urinary tract infection which was further complicated by the development of complicated diverticulitis with perforation and gangrenous changes requiring Hartman's procedure performed on 03/01/21. Pt developed severe septic shock after procedure requiring vasopressors. Additionally, pt has metabolic acidosis. PMH includes CAD s/p CABG (2015), HTN, hypothyroidism, colon bladder fistula, diverticulitis, and ulcerative colitis. Pharmacy has been consulted for electrolyte monitoring.  Patient was extubated and re-intubated 3/29  3/30: Furosemide 40 mg IV x 1 dose, metolazone 2.5 mg PO x 1 dose  Nutrition: Continous tube feeds restarted 3/30 @ 55 mL/hr  Hypernatremia resolved Na = 144, Hypokalemia resolved K = 4.6, Scr improving 0.87  Goal of Therapy:  Electrolytes WNL (K > 4)  Plan:  - Mg 1.7 - repleted with 2G IV bolus -  Other electrolytes WNL's (no further replenishment warranted) -- F/u electrolytes with AM labs  Lu Duffel, PharmD, BCPS Clinical Pharmacist 03/11/2021 8:40 AM

## 2021-03-11 NOTE — Progress Notes (Signed)
GOALS OF CARE DISCUSSION  The Clinical status was relayed to family in detail. Daughter [name] at bedside  Updated and notified of patients medical condition.  Patient remains unresponsive and will not open eyes to command.    Patient is having a weak cough and struggling to remove secretions.   Patient with increased WOB and using accessory muscles to breathe Explained to family course of therapy and the modalities      PATIENT REMAINS DNR    Family are satisfied with Plan of action and management. All questions answered  Additional CC time 35 mins   Pegeen Stiger Patricia Pesa, M.D.  [facility]  Medical Director [place] Director [facility] Cardio-Pulmonary Department

## 2021-03-12 ENCOUNTER — Inpatient Hospital Stay: Payer: Medicare HMO

## 2021-03-12 ENCOUNTER — Encounter: Payer: Self-pay | Admitting: Surgery

## 2021-03-12 DIAGNOSIS — N390 Urinary tract infection, site not specified: Secondary | ICD-10-CM | POA: Diagnosis not present

## 2021-03-12 DIAGNOSIS — Z4659 Encounter for fitting and adjustment of other gastrointestinal appliance and device: Secondary | ICD-10-CM | POA: Diagnosis not present

## 2021-03-12 DIAGNOSIS — Z0189 Encounter for other specified special examinations: Secondary | ICD-10-CM

## 2021-03-12 DIAGNOSIS — K5792 Diverticulitis of intestine, part unspecified, without perforation or abscess without bleeding: Secondary | ICD-10-CM | POA: Diagnosis not present

## 2021-03-12 DIAGNOSIS — K5720 Diverticulitis of large intestine with perforation and abscess without bleeding: Secondary | ICD-10-CM | POA: Diagnosis not present

## 2021-03-12 DIAGNOSIS — K651 Peritoneal abscess: Secondary | ICD-10-CM | POA: Diagnosis not present

## 2021-03-12 DIAGNOSIS — D72829 Elevated white blood cell count, unspecified: Secondary | ICD-10-CM | POA: Diagnosis not present

## 2021-03-12 DIAGNOSIS — B967 Clostridium perfringens [C. perfringens] as the cause of diseases classified elsewhere: Secondary | ICD-10-CM

## 2021-03-12 DIAGNOSIS — N321 Vesicointestinal fistula: Secondary | ICD-10-CM | POA: Diagnosis not present

## 2021-03-12 DIAGNOSIS — Z978 Presence of other specified devices: Secondary | ICD-10-CM

## 2021-03-12 DIAGNOSIS — R9431 Abnormal electrocardiogram [ECG] [EKG]: Secondary | ICD-10-CM | POA: Diagnosis not present

## 2021-03-12 DIAGNOSIS — R109 Unspecified abdominal pain: Secondary | ICD-10-CM | POA: Diagnosis not present

## 2021-03-12 LAB — CBC
HCT: 26.7 % — ABNORMAL LOW (ref 36.0–46.0)
Hemoglobin: 8.6 g/dL — ABNORMAL LOW (ref 12.0–15.0)
MCH: 30.7 pg (ref 26.0–34.0)
MCHC: 32.2 g/dL (ref 30.0–36.0)
MCV: 95.4 fL (ref 80.0–100.0)
Platelets: 180 10*3/uL (ref 150–400)
RBC: 2.8 MIL/uL — ABNORMAL LOW (ref 3.87–5.11)
RDW: 16.4 % — ABNORMAL HIGH (ref 11.5–15.5)
WBC: 12.7 10*3/uL — ABNORMAL HIGH (ref 4.0–10.5)
nRBC: 0 % (ref 0.0–0.2)

## 2021-03-12 LAB — COMPREHENSIVE METABOLIC PANEL
ALT: 32 U/L (ref 0–44)
AST: 62 U/L — ABNORMAL HIGH (ref 15–41)
Albumin: 2.3 g/dL — ABNORMAL LOW (ref 3.5–5.0)
Alkaline Phosphatase: 70 U/L (ref 38–126)
Anion gap: 4 — ABNORMAL LOW (ref 5–15)
BUN: 48 mg/dL — ABNORMAL HIGH (ref 8–23)
CO2: 25 mmol/L (ref 22–32)
Calcium: 7.9 mg/dL — ABNORMAL LOW (ref 8.9–10.3)
Chloride: 116 mmol/L — ABNORMAL HIGH (ref 98–111)
Creatinine, Ser: 0.99 mg/dL (ref 0.44–1.00)
GFR, Estimated: >60 mL/min (ref >60)
Glucose, Bld: 262 mg/dL — ABNORMAL HIGH (ref 70–99)
Potassium: 4.2 mmol/L (ref 3.5–5.1)
Sodium: 145 mmol/L (ref 135–145)
Total Bilirubin: 0.7 mg/dL (ref 0.3–1.2)
Total Protein: 5.5 g/dL — ABNORMAL LOW (ref 6.5–8.1)

## 2021-03-12 LAB — GLUCOSE, CAPILLARY
Glucose-Capillary: 147 mg/dL — ABNORMAL HIGH (ref 70–99)
Glucose-Capillary: 167 mg/dL — ABNORMAL HIGH (ref 70–99)
Glucose-Capillary: 170 mg/dL — ABNORMAL HIGH (ref 70–99)
Glucose-Capillary: 176 mg/dL — ABNORMAL HIGH (ref 70–99)
Glucose-Capillary: 183 mg/dL — ABNORMAL HIGH (ref 70–99)
Glucose-Capillary: 230 mg/dL — ABNORMAL HIGH (ref 70–99)
Glucose-Capillary: 245 mg/dL — ABNORMAL HIGH (ref 70–99)
Glucose-Capillary: 249 mg/dL — ABNORMAL HIGH (ref 70–99)

## 2021-03-12 LAB — PHOSPHORUS: Phosphorus: 2.6 mg/dL (ref 2.5–4.6)

## 2021-03-12 LAB — MAGNESIUM: Magnesium: 1.8 mg/dL (ref 1.7–2.4)

## 2021-03-12 MED ORDER — METOPROLOL TARTRATE 25 MG PO TABS
25.0000 mg | ORAL_TABLET | Freq: Two times a day (BID) | ORAL | Status: DC
  Administered 2021-03-12 – 2021-03-14 (×5): 25 mg
  Filled 2021-03-12 (×6): qty 1

## 2021-03-12 MED ORDER — SODIUM CHLORIDE 0.9 % IV SOLN
1.0000 g | Freq: Two times a day (BID) | INTRAVENOUS | Status: DC
  Administered 2021-03-12: 1 g via INTRAVENOUS
  Filled 2021-03-12 (×3): qty 1

## 2021-03-12 MED ORDER — INSULIN ASPART 100 UNIT/ML ~~LOC~~ SOLN
0.0000 [IU] | SUBCUTANEOUS | Status: DC
  Administered 2021-03-12: 4 [IU] via SUBCUTANEOUS
  Administered 2021-03-12: 7 [IU] via SUBCUTANEOUS
  Administered 2021-03-12: 3 [IU] via SUBCUTANEOUS
  Administered 2021-03-12: 4 [IU] via SUBCUTANEOUS
  Administered 2021-03-12 (×2): 7 [IU] via SUBCUTANEOUS
  Administered 2021-03-13 (×5): 4 [IU] via SUBCUTANEOUS
  Administered 2021-03-13: 3 [IU] via SUBCUTANEOUS
  Administered 2021-03-14 – 2021-03-15 (×2): 7 [IU] via SUBCUTANEOUS
  Administered 2021-03-15 – 2021-03-17 (×8): 3 [IU] via SUBCUTANEOUS
  Administered 2021-03-17 (×2): 4 [IU] via SUBCUTANEOUS
  Administered 2021-03-18 (×6): 3 [IU] via SUBCUTANEOUS
  Administered 2021-03-19 (×2): 4 [IU] via SUBCUTANEOUS
  Administered 2021-03-19 – 2021-03-20 (×5): 3 [IU] via SUBCUTANEOUS
  Administered 2021-03-20: 4 [IU] via SUBCUTANEOUS
  Administered 2021-03-20 – 2021-03-21 (×3): 3 [IU] via SUBCUTANEOUS
  Administered 2021-03-21 (×3): 4 [IU] via SUBCUTANEOUS
  Administered 2021-03-22 (×4): 3 [IU] via SUBCUTANEOUS
  Administered 2021-03-22: 4 [IU] via SUBCUTANEOUS
  Administered 2021-03-23 (×2): 3 [IU] via SUBCUTANEOUS
  Filled 2021-03-12 (×50): qty 1

## 2021-03-12 MED ORDER — MAGNESIUM SULFATE 2 GM/50ML IV SOLN
2.0000 g | Freq: Once | INTRAVENOUS | Status: AC
  Administered 2021-03-12: 2 g via INTRAVENOUS
  Filled 2021-03-12: qty 50

## 2021-03-12 MED ORDER — METOPROLOL TARTRATE 5 MG/5ML IV SOLN
2.5000 mg | Freq: Once | INTRAVENOUS | Status: AC
  Administered 2021-03-12: 2.5 mg via INTRAVENOUS
  Filled 2021-03-12: qty 5

## 2021-03-12 MED ORDER — PROSOURCE TF PO LIQD
45.0000 mL | Freq: Every day | ORAL | Status: DC
  Administered 2021-03-13 – 2021-03-16 (×4): 45 mL
  Filled 2021-03-12 (×4): qty 45

## 2021-03-12 MED ORDER — INSULIN DETEMIR 100 UNIT/ML ~~LOC~~ SOLN
6.0000 [IU] | Freq: Two times a day (BID) | SUBCUTANEOUS | Status: DC
  Administered 2021-03-12 – 2021-03-15 (×7): 6 [IU] via SUBCUTANEOUS
  Filled 2021-03-12 (×9): qty 0.06

## 2021-03-12 MED ORDER — VITAL AF 1.2 CAL PO LIQD
1000.0000 mL | ORAL | Status: DC
  Administered 2021-03-12 – 2021-03-14 (×4): 1000 mL

## 2021-03-12 NOTE — Progress Notes (Signed)
[place] for Electrolyte Monitoring and Replacement   Recent Labs: Potassium (mmol/L)  Date Value  03/12/2021 4.2   Magnesium (mg/dL)  Date Value  03/12/2021 1.8   Calcium (mg/dL)  Date Value  03/12/2021 7.9 (L)   Albumin (g/dL)  Date Value  03/12/2021 2.3 (L)   Phosphorus (mg/dL)  Date Value  03/12/2021 2.6   Sodium (mmol/L)  Date Value  03/12/2021 145   Corrected Ca: 9.3  Assessment: Desiree Madden is a 70 y.o. female admitted on 02/21/2021 with intra-abdominal infection (diverticulitis w/ fecal perforation). Patient septic with acute diverticulitis abdominal abscess with colovesical fistula and enterococcus urinary tract infection which was further complicated by the development of complicated diverticulitis with perforation and gangrenous changes requiring Hartman's procedure performed on 03/01/21. Pt developed severe septic shock after procedure requiring vasopressors. Additionally, pt has metabolic acidosis. PMH includes CAD s/p CABG (2015), HTN, hypothyroidism, colon bladder fistula, diverticulitis, and ulcerative colitis. Pharmacy has been consulted for electrolyte monitoring.  Patient was extubated and re-intubated 3/29  Nutrition: Continous tube feeds restarted 3/30 @ 55 mL/hr  Goal of Therapy:  K 4.0-5.1 Mg 2.0-2.4 Electrolytes WNL  Plan:  -- Mg 1.8 - repleted with Mg 2g IV x 1 dose  -- Na 145 (improving) - continue free water 100 mL per tube q4h -- Other electrolytes WNL's (no further replenishment warranted) -- F/u electrolytes with AM labs  [name], PharmD Pharmacy Resident  03/12/2021 12:47 PM

## 2021-03-12 NOTE — Progress Notes (Signed)
Inpatient Diabetes Program Recommendations  AACE/ADA: New Consensus Statement on Inpatient Glycemic Control   Target Ranges:  Prepandial:   less than 140 mg/dL      Peak postprandial:   less than 180 mg/dL (1-2 hours)      Critically ill patients:  140 - 180 mg/dL   Results for NELISSA, BOLDUC (MRN [id number]) as of 03/12/2021 11:17  Ref. Range 03/11/2021 11:06 03/11/2021 15:01 03/11/2021 20:44 03/11/2021 23:33 03/12/2021 03:57  Glucose-Capillary Latest Ref Range: 70 - 99 mg/dL 163 (H) 97 296 (H) 292 (H) 249 (H)   Review of Glycemic Control  Current orders for Inpatient glycemic control: Novolog 0-20 units Q4H; Solucortef 50 mg Q6H, Vital @ 55 ml/hr  Inpatient Diabetes Program Recommendations:    Insulin: If steroids are continued, may want to consider ordering Levemir 6 units BID. Please note that once steroids are tapered, will likely need to taper basal insulin as well.  Thanks, Barnie Alderman, RN, MSN, CDE Diabetes Coordinator Inpatient Diabetes Program [phone] (Team Pager from 8am to 5pm)

## 2021-03-12 NOTE — Plan of Care (Signed)
Placed on aerosol trach collar with 28% Fio2. Tolerating well

## 2021-03-12 NOTE — Progress Notes (Signed)
Nutrition Follow-up  DOCUMENTATION CODES:   Severe malnutrition in context of acute illness/injury  INTERVENTION:   Change to Vital AF 1.2 @ 18m/hr + ProSource TF- 479mdaily via tube   Free water flushes 10030m4 hours to maintain tube patency   Regimen provides 1624kcal/day, 110g/day protein and 1670m60my free water  NUTRITION DIAGNOSIS:   Severe Malnutrition related to acute illness as evidenced by moderate fat depletion,moderate muscle depletion,severe muscle depletion. -new diagnoses    GOAL:   Provide needs based on ASPEN/SCCM guidelines  -met with tube feeds   MONITOR:   Vent status,Labs,Weight trends,TF tolerance,Skin,I & O's  ASSESSMENT:   69 y53. female with h/o HTN, CKD III, colovesical fistula, hypothroidism and HLD who is admitted with diverticulitis and constipation.   Pt s/p sigmoid colectomy with end colostomy, Hartman's procedure, splenic flexure mobilization and fistula repair 3/24  Pt s/p tracheostomy and NGT 3/31  Pt s/p ostomy revision and maturation 4/1  Pt tolerating tube feeds at goal rate via NGT. Refeed labs stable. Repeat NFPE on patient today shows increased muscle and fat depletions now that edema is improving. Pt meets criteria for severe acute malnutrition. Per chart, pt is up ~20lbs since admit but appears to be weight stable over the past 5-6 days. Pt +1.1L on her I & O's. Pt is having stool via her ostomy. Will adjust tube feeds to provide more calories now that patient is s/p tracheostomy.   Medications reviewed and include: colace, insulin, synthroid, MVI, protonix, miralax, precedex, diflucan, meropenem, vasopressin   Labs reviewed: K 4.2 wnl, P 2.6 wnl, Mg 1.8 wnl Wbc- 12.7(H), Hgb 8.6(L), Hct 26.7(L) cbgs- 163, 97, 296, 292, 249 x 24 hrs  Patient is currently intubated on ventilator support MV: 7.3 L/min Temp (24hrs), Avg:99.4 F (37.4 C), Min:98.6 F (37 C), Max:100.2 F (37.9 C)  Propofol: none   MAP- >65mm16mUOP-  2750ml 24mwsheet Row Most Recent Value  Orbital Region Mild depletion  Upper Arm Region Unable to assess  Thoracic and Lumbar Region Unable to assess  Buccal Region Moderate depletion  Temple Region Severe depletion  Clavicle Bone Region Moderate depletion  Clavicle and Acromion Bone Region Moderate depletion  Scapular Bone Region Unable to assess  Dorsal Hand No depletion  Patellar Region Moderate depletion  Anterior Thigh Region Mild depletion  Posterior Calf Region Moderate depletion  Edema (RD Assessment) Moderate  Hair Reviewed  Eyes Reviewed  Mouth Reviewed  Skin Reviewed  Nails Reviewed     Diet Order:    Diet Order            Diet NPO time specified  Diet effective now                EDUCATION NEEDS:   Not appropriate for education at this time  Skin: deep tissue injury L buttock 0.2x2cm, Stage I R ear  Incisions: closed abdomen, neck  Last BM:  4/4- 295ml v82mstomy  Height:   Ht Readings from Last 1 Encounters:  03/10/21 5' 4.02" (1.626 m)    Weight:   Wt Readings from Last 1 Encounters:  03/01/21 67.2 kg    Ideal Body Weight:  54.5 kg  BMI:  Body mass index is 25.42 kg/m.  Estimated Nutritional Needs:   Kcal:  1600-1800kcal/day  Protein:  95-110g/day  Fluid:  1.4-1.7L/day  Matison Nuccio CKoleen Distance, LDN Please refer to [facility] and/or RD on-call/weekend/after hours pager

## 2021-03-12 NOTE — Progress Notes (Signed)
PHARMACY NOTE:  ANTIMICROBIAL RENAL DOSAGE ADJUSTMENT  Current antimicrobial regimen includes a mismatch between antimicrobial dosage and estimated renal function.  As per policy approved by the Pharmacy & Therapeutics and Medical Executive Committees, the antimicrobial dosage will be adjusted accordingly.  Current antimicrobial dosage:  Meropenem 1 g q8h  Indication: Intra-abdominal infection  Renal Function:  Estimated Creatinine Clearance: 49.8 mL/min (by C-G formula based on SCr of 0.99 mg/dL).    Antimicrobial dosage has been changed to:  Meropenem 1g q12h  Additional comments:  Thank you for allowing pharmacy to be a part of this patient's care.  [name], PharmD Pharmacy Resident  03/12/2021 8:43 AM

## 2021-03-12 NOTE — Progress Notes (Signed)
[place] Hospital Day(s): [place] op day(s): 3 Days Post-Op.   Interval History: Patient seen and examined, sedated on trach mask, lethargic.   Vital signs in last 24 hours: [min-max] current  Temp:  [98.6 F (37 C)-100.2 F (37.9 C)] 100 F (37.8 C) (04/04 1100) Pulse Rate:  [66-140] 114 (04/04 1100) Resp:  [9-25] 19 (04/04 1100) BP: (88-138)/(49-103) 116/76 (04/04 1100) SpO2:  [93 %-100 %] 99 % (04/04 1100) FiO2 (%):  [28 %-30 %] 28 % (04/04 1033)     Height: 5' 4.02" (162.6 cm) Weight: 67.2 kg BMI (Calculated): 25.42   Intake/Output last 2 shifts:  04/03 0701 - 04/04 0700 In: 674.9 [I.V.:378.5; NG/GT:120; IV Piggyback:176.5] Out: 1552 [Urine:2750; Stool:295]   Physical Exam:  Constitutional: no distress  Respiratory: breathing non-labored at rest  Cardiovascular: regular rate and sinus rhythm  Gastrointestinal: soft, non-tender, and non-distended.  Soupy discharge from [place].  Some staples in midline incision.  Stoma with stool output. Integumentary: No evidence of erythema or induration.  Anasarca.  Labs:  CBC Latest Ref Rng & Units 03/12/2021 03/11/2021 03/10/2021  WBC 4.0 - 10.5 K/uL 12.7(H) 18.7(H) 21.6(H)  Hemoglobin 12.0 - 15.0 g/dL 8.6(L) 9.7(L) 9.6(L)  Hematocrit 36.0 - 46.0 % 26.7(L) 30.9(L) 30.5(L)  Platelets 150 - 400 K/uL 180 196 217   CMP Latest Ref Rng & Units 03/12/2021 03/11/2021 03/10/2021  Glucose 70 - 99 mg/dL 262(H) 182(H) -  BUN 8 - 23 mg/dL 48(H) 41(H) -  Creatinine 0.44 - 1.00 mg/dL 0.99 0.87 -  Sodium 135 - 145 mmol/L 145 144 -  Potassium 3.5 - 5.1 mmol/L 4.2 4.6 3.8  Chloride 98 - 111 mmol/L 116(H) 111 -  CO2 22 - 32 mmol/L 25 27 -  Calcium 8.9 - 10.3 mg/dL 7.9(L) 7.4(L) -  Total Protein 6.5 - 8.1 g/dL 5.5(L) - -  Total Bilirubin 0.3 - 1.2 mg/dL 0.7 - -  Alkaline Phos 38 - 126 U/L 70 - -  AST 15 - 41 U/L 62(H) - -  ALT 0 - 44 U/L 32 - -     Imaging studies: No new pertinent imaging  studies   Assessment/Plan:  70 y.o. female with prolonged ICU stay, multifactorial. 3 Days Post-Op s/p colostomy revision for prior Hartman's, septic shock, gangrene sigmoid with extensive chronic diverticular changes.  Complicated by pertinent comorbidities including:   Patient Active Problem List   Diagnosis Date Noted  . Diverticulitis 02/21/2021  . Fistula, bladder 02/21/2021  . Ulcerative colitis ([place]) 02/21/2021  . Hyperlipidemia 02/21/2021  . Essential hypertension 02/21/2021  . Hypothyroidism 02/21/2021  . Abnormal ECG 02/21/2021  . CKD (chronic kidney disease), stage III ([place]) 02/21/2021  . Elevated lactic acid level 02/21/2021    -Would remove the remaining staples and complete packing changes with twice daily dressing changes.  -Continue tube feeding.  -Antibiotics per infectious disease.  -Concur with LTAC search.    -- Ronny Bacon, M.D., [facility] 03/12/2021

## 2021-03-12 NOTE — Progress Notes (Signed)
NAME:  Desiree Madden, MRN:  [id number], DOB:  1951/01/05, LOS: 19 ADMISSION DATE:  02/21/2021  BRIEF SYNOPSIS  Brief Pt Description:  70 y.o. Female admitted 02/21/2021 due to Diverticulitis with Colovesical fistula and UTI. On 03/01/21 she developed complicated Diverticulitis with perforation requiring Sigmoid colectomy with end colostomy and Hartman's procedure. Post procedure she returns to ICU and remains intubated with Septic Shock requiring multiple vasopressors.    Significant Hospital Events: Including procedures, antibiotic start and stop dates in addition to other pertinent events    02/21/2021:Admitted to Med/Surg unit with Diverticulitis and UTI  02/28/2021:LLQ abdominal drain placed by IR to pelvic abscess  03/01/2021:Worsening Leukocytosis despite drain placement. Found to have complicated Diverticulitis with perforation requiring Sigmoid Colectomy with end colostomy and Hartman's Procedure. Post procedure she returns to ICU and remains intubated with Septic Shock requiring multiple vasopressors. Left Femoral CVC and Arterial lines placed emergently  03/04/2021:Severe anasarca, albumin 1.8 DX of NSTEMI  3/28 failure to wean from vent  3/29 severe agitation, septic shock on pressors  3/29 failed extubation, re intubated daughter updated  3/30 ENT consulted  3/31 [facility] placed, daughter updated, made DNR status  4/1 colostomy revision, remains on vent, daughter updated  4/2 failed weaning trials   4/4 patient is on 5/5 back to put her on SAT SBT still minimally responsive and now looking for an LTAC bed.    Cultures:  02/21/21: SARS CoV-2 PCR>>negative 02/21/21: Influenza PCR>>negative 02/21/2021: Urine>>ENTEROCOCCUS FAECALIS, ENTEROCOCCUS FAECIUM(sensitive to vancomycin and ampicillin) 02/22/2021: HIV screen>> nonreactive 02/28/2021: LLQ Abdominal drain>>Enterococcusavium, other polymicrobial Candida albicans 03/01/2021: MRSA PCR>>  negative 03/01/2021: Blood culture x2>>  Antimicrobials:  Ciprofloxacin 3/16>>3/18 Unasyn 3/23>>3/23 Flagyl 3/16>>3/18; 3/24>>3/29 Cefepime 3/24>>3/29 Vancomycin 3/24>>3/29 UNASYN 3/29-->      Interim History / Subjective:  Remains intubated On vent S/p trach Revised colostomy On precedex    Objective   Blood pressure 116/76, pulse (!) 114, temperature 100 F (37.8 C), temperature source Axillary, resp. rate 19, height 5' 4.02" (1.626 m), weight 67.2 kg, SpO2 99 %.    Vent Mode: PSV FiO2 (%):  [28 %-30 %] 28 % Set Rate:  [15 bmp] 15 bmp Vt Set:  [450 mL] 450 mL PEEP:  [5 cmH20] 5 cmH20 Pressure Support:  [5 cmH20] 5 cmH20 Plateau Pressure:  [16 cmH20-18 cmH20] 18 cmH20   Intake/Output Summary (Last 24 hours) at 03/12/2021 1157 Last data filed at 03/12/2021 1108 Gross per 24 hour  Intake 888.21 ml  Output 2495 ml  Net -1606.79 ml   Filed Weights   02/27/21 0900 02/28/21 1014 03/01/21 1511  Weight: 66.9 kg 67.2 kg 67.2 kg      REVIEW OF SYSTEMS  PATIENT IS UNABLE TO PROVIDE COMPLETE REVIEW OF SYSTEMS DUE TO SEVERE CRITICAL ILLNESS AND TOXIC METABOLIC ENCEPHALOPATHY   PHYSICAL EXAMINATION:  GENERAL:critically ill appearing, +resp distress HEAD: Normocephalic, atraumatic.  EYES: Pupils equal, round, reactive to light.  No scleral icterus.  MOUTH: Moist mucosal membrane. NECK: Supple.s/p t rach PULMONARY: +rhonchi, +wheezing CARDIOVASCULAR: S1 and S2. Regular rate and rhythm. No murmurs, rubs, or gallops.  GASTROINTESTINAL: colostomy +distention  MUSCULOSKELETAL: +edema.  NEUROLOGIC: obtunded SKIN:intact,warm,dry   Labs/imaging that I havepersonally reviewed  (right click and "Reselect all SmartList Selections" daily)    Resolved Hospital Problem list   na    ASSESSMENT AND PLAN SYNOPSIS  70 yo with acute and severe septic shock with hemorrhagic shock with acute hypoxic resp failure with septic shock and NSTEMI due to complicated Complicated  Diverticulitis/Pelvic Abscess  with Perforation &Gangrene of the Sigmoid Colon, Colovesical Fistula failed trial of extubation and re-intubated 3/29 Trach on 3/31   Severe ACUTE Hypoxic and Hypercapnic Respiratory Failure -continue Mechanical Ventilator support -continue Bronchodilator Therapy -Wean Fio2 and PEEP as tolerated -VAP/VENT bundle implementation Failure to wean from vent Will wean sedation and PS mode when daughter arrives to bedside  CARDIAC FAILURE-acute  systolic dysfunction NSTEMI -oxygen as needed -Lasix as tolerated -follow up cardiac enzymes as indicated (Concern for basal to mid inferior wall  hypokinesis).    CARDIAC ICU monitoring   ACUTE KIDNEY INJURY/Renal Failure -continue Foley Catheter-assess need -Avoid nephrotoxic agents -Follow urine output, BMP -Ensure adequate renal perfusion, optimize oxygenation -Renal dose medications   NEUROLOGY Acute toxic metabolic encephalopathy Wean sedation started on precedex   SEPTIC SHOCK -use vasopressors to keep MAP>65 as needed   INFECTIOUS DISEASE -continue antibiotics as prescribed -follow up cultures -follow up ID consultation +CANDIDA ALBICANS +ENTEROCOCCUS AVIUM +CLOSTRIDIUM +BACTEROIDES   ENDO - ICU hypoglycemic\Hyperglycemia protocol -check FSBS per protocol   GI GI PROPHYLAXIS as indicated  NUTRITIONAL STATUS DIET-->TF's as tolerated Constipation protocol as indicated   ELECTROLYTES -follow labs as needed -replace as needed -pharmacy consultation and following     Best practice (right click and "Reselect all SmartList Selections" daily)  Diet: NPO Pain/Anxiety/Delirium protocol (if indicated): Yes (RASS goal -1) VAP protocol (if indicated): Yes DVT prophylaxis: SCD GI prophylaxis: PPI Glucose control: SSI Yes Central venous access: Yes, and it is still needed Arterial line: N/A Foley: N/A Mobility: bed rest PT consulted: N/A Code  Status:DNR     Labs   CBC: Recent Labs  Lab 03/08/21 0702 03/09/21 0453 03/10/21 0559 03/11/21 0357 03/12/21 0431  WBC 24.6* 17.8* 21.6* 18.7* 12.7*  HGB 8.7* 8.9* 9.6* 9.7* 8.6*  HCT 26.8* 27.2* 30.5* 30.9* 26.7*  MCV 92.1 92.8 93.8 94.8 95.4  PLT 187 175 217 196 706    Basic Metabolic Panel: Recent Labs  Lab 03/07/21 1850 03/08/21 0531 03/09/21 0453 03/10/21 0559 03/10/21 2045 03/11/21 0357 03/12/21 0431  NA 148* 149* 148* 145  --  144 145  K 3.3* 3.9 3.3* 3.5 3.8 4.6 4.2  CL 114* 113* 112* 109  --  111 116*  CO2 $Re'25 28 30 30  'ulp$ --  27 25  GLUCOSE 126* 90 178* 109*  --  182* 262*  BUN 44* 44* 47* 43*  --  41* 48*  CREATININE 1.01* 1.29* 1.29* 0.90  --  0.87 0.99  CALCIUM 7.5* 8.5* 8.0* 7.7*  --  7.4* 7.9*  MG 1.7  --  1.8 2.0  --  1.7 1.8  PHOS 2.6  --  2.5 2.5  --  2.1* 2.6   GFR: Estimated Creatinine Clearance: 49.8 mL/min (by C-G formula based on SCr of 0.99 mg/dL). Recent Labs  Lab 03/09/21 0453 03/10/21 0559 03/11/21 0357 03/12/21 0431  WBC 17.8* 21.6* 18.7* 12.7*    Liver Function Tests: Recent Labs  Lab 03/06/21 0452 03/07/21 1850 03/12/21 0431  AST  --  29 62*  ALT  --  15 32  ALKPHOS  --  35* 70  BILITOT  --  1.3* 0.7  PROT  --  4.7* 5.5*  ALBUMIN 2.9* 3.1* 2.3*   No results for input(s): LIPASE, AMYLASE in the last 168 hours. No results for input(s): AMMONIA in the last 168 hours.  ABG    Component Value Date/Time   PHART 7.35 03/06/2021 1811   PCO2ART 46 03/06/2021 1811   PO2ART 100  03/06/2021 1811   HCO3 25.4 03/06/2021 1811   ACIDBASEDEF 0.4 03/06/2021 1811   O2SAT 97.4 03/06/2021 1811     Coagulation Profile: No results for input(s): INR, PROTIME in the last 168 hours.  Cardiac Enzymes: No results for input(s): CKTOTAL, CKMB, CKMBINDEX, TROPONINI in the last 168 hours.  HbA1C: No results found for: HGBA1C  CBG: Recent Labs  Lab 03/11/21 1106 03/11/21 1501 03/11/21 2044 03/11/21 2333 03/12/21 0357  GLUCAP  163* 97 296* 292* 249*    Allergies Allergies  Allergen Reactions  . Baclofen     Other reaction(s): Headache  . Sertraline     Other reaction(s): Other (See Comments), Other (See Comments) Muscle aches Muscle aches   . Sulfa Antibiotics     Other reaction(s): Other (See Comments), Other (See Comments) Pt was told not to take it due to colitis Pt was told not to take it due to colitis   . Ibuprofen Nausea And Vomiting  . Trazodone And Nefazodone        DVT/GI PRX  assessed I Assessed the need for Labs I Assessed the need for Foley I Assessed the need for Central Venous Line Family Discussion when available I Assessed the need for Mobilization I made an Assessment of medications to be adjusted accordingly Safety Risk assessment completed  CASE DISCUSSED IN MULTIDISCIPLINARY ROUNDS WITH ICU TEAM     Overall, patient is critically ill, prognosis is guarded.  Patient with Multiorgan failure and at high risk for cardiac arrest and death.   DNR status, poor prognosis

## 2021-03-12 NOTE — Progress Notes (Addendum)
Date of Admission:  02/21/2021 70 y.o. female with history of colovesicalfistula, diverticulitis, hypertension, hypothyroidism, ulcerative colitis, hyperlipidemia presented to [facility] on 02/21/2021 with lower abdominal pain. Labs showed WBC of 17.9, Hb 11, platelet 267, creatinine 1.40, LFTs normal, blood glucose 158 and lactate 3.  Blood cultures were sent.  She had CT abdomen in the ED which showed advanced colonic diverticulosis with pericolonic fat stranding about the proximal sigmoid colon.  This fat stranding was extending to the adjacent urinary bladder. Fluid level in the urinary bladder.  This may be concerning for fistula with pericolonic edema extending to the anterior aspect of the bladder. Large volume colonic stool from the splenic flexure distally was seen suggesting constipation.  There was also chronic but progressive moderately severe T11 compression fracture.  She was diagnosed with diverticulitis and put on Cipro and Flagyl and bowel rest.  She was seen by GI who did not recommend colonoscopy.  Was seen by surgery And because of the constipation which was aggravating the diverticulitis it was recommended to relieve the constipation and continue treating with IV Zosyn before any surgical intervention could be done.  Urine culture on admission had Enterococcus faecalis.  Patient had another CT abdomen on 3/19 and then 3/23 and the latter showed a new pericolonic abscess with possible intramural component but definite extramural component in the left pelvic measuring 8.3 to 4.7 cm.  On 02/28/2021 patient underwent image guided drain placement in the left lower quadrant.  He had multiple organisms from the stool which included Enterococcus avium, Candida albicans, Clostridium, Bacteroides.  She continued on Zosyn Because of persistent leukocytosis in spite of drain placement she was taken for surgery on 03/01/2021.  Had laparotomy and found to have massive edema and chronically scarred sigmoid  colon extending from mid descending colon with redundancy in the right pelvis.  There was gangrenous changes with perforation and feculent peritonitis with thick peel and biofilm of the left lower quadrant.  She underwent sigmoid colectomy with end colostomy, Hartman's procedure and CVF repair .Postop she returned to ICU with severe septic shock and remained intubated.  Started on pressors and also had left femoral central vein catheter and arterial line was placed emergently. She was started on cefepime, metronidazole and vancomycin.  Also had non-STEMI by troponins and was seen by cardiology. Failure to wean off the vent on 03/05/2021. Has tracheostomy now      ID: Desiree Madden is a 70 y.o. female  Principal Problem:   Diverticulitis Active Problems:   Fistula, bladder   Ulcerative colitis ([place])   Hyperlipidemia   Essential hypertension   Hypothyroidism   Abnormal ECG   CKD (chronic kidney disease), stage III ([facility])   Elevated lactic acid level   LDA PICC 03/02/21>> Urethral catheter 03/01/21 Triple-lumen femoral  03/02/2021>>03/06/21 Arterial line left femoral placed on 03/01/2021>>03/06/21 Close system drained right lower quadrant placed on 03/01/2021 Airway 03/01/2021 Colostomy 03/01/2021  Subjective: None available   Medications:  . chlorhexidine gluconate (MEDLINE KIT)  15 mL Mouth Rinse BID  . Chlorhexidine Gluconate Cloth  6 each Topical Daily  . docusate  100 mg Per Tube BID  . [START ON 03/13/2021] feeding supplement (PROSource TF)  45 mL Per Tube Daily  . free water  100 mL Per Tube Q4H  . hydrocortisone sod succinate (SOLU-CORTEF) inj  50 mg Intravenous Q6H  . insulin aspart  0-20 Units Subcutaneous Q4H  . insulin detemir  6 Units Subcutaneous BID  . levothyroxine  75 mcg Per  Tube Q0600  . mouth rinse  15 mL Mouth Rinse 10 times per day  . midodrine  10 mg Per Tube TID WC  . multivitamin with minerals  1 tablet Per Tube Daily  . pantoprazole (PROTONIX) IV  40 mg  Intravenous Daily  . polyethylene glycol  17 g Per Tube Daily  . sodium chloride flush  10-40 mL Intracatheter Q12H  . sodium chloride flush  5 mL Intracatheter Q8H    Objective: Vital signs in last 24 hours: Temp:  [98.6 F (37 C)-100.2 F (37.9 C)] 100 F (37.8 C) (04/04 1100) Pulse Rate:  [66-140] 109 (04/04 1300) Resp:  [9-25] 18 (04/04 1300) BP: (96-138)/(53-103) 130/78 (04/04 1300) SpO2:  [93 %-100 %] 99 % (04/04 1300) FiO2 (%):  [28 %-30 %] 28 % (04/04 1033)  PHYSICAL EXAM:  General Tracheostomy, on the vent. sedated Lungs: b/l air entry. Heart: irrgeular Abdomen: Soft,colostomy Extremities: edema of extremities Rt PICC Neurologic: cannot assess  Lab Results Recent Labs    03/11/21 0357 03/12/21 0431  WBC 18.7* 12.7*  HGB 9.7* 8.6*  HCT 30.9* 26.7*  NA 144 145  K 4.6 4.2  CL 111 116*  CO2 27 25  BUN 41* 48*  CREATININE 0.87 0.99   Liver Panel Recent Labs    03/12/21 0431  PROT 5.5*  ALBUMIN 2.3*  AST 62*  ALT 32  ALKPHOS 70  BILITOT 0.7    Microbiology: 02/28/21  Intraabdominal culture- enterococcus, canida, clostridium and bacteroides 3/24 Bc NG Studies/Results: DG Chest Port 1 View  Result Date: 03/12/2021 CLINICAL DATA:  Acute respiratory failure EXAM: PORTABLE CHEST 1 VIEW COMPARISON:  Prior chest x-ray 03/06/2021 FINDINGS: A tracheostomy tube is now present. The tip is midline and at the level of the clavicles. Right upper extremity PICC in good position with the tip at the superior cavoatrial junction. Well-positioned gastric tube with the tip overlying the gastric fundus. Patient is status post median sternotomy with evidence of prior multivessel CABG. The cardiac and mediastinal contours are normal. Markedly decreased pulmonary vascular congestion and interstitial edema compared to prior. Chronic bronchitic changes and lingular atelectasis versus scarring. No large pleural effusion or pneumothorax. No acute osseous abnormality. IMPRESSION: 1.  Interval resolution of pulmonary edema compared to 03/06/2021. 2. Interval placement of a tracheostomy tube which is in good position. 3. Other support apparatus remain in stable and satisfactory position. 4. Chronic bronchitic changes and lingular atelectasis versus scarring. Electronically Signed   By: Jacqulynn Cadet M.D.   On: 03/12/2021 06:51     Assessment/Plan: Acute on chronic diverticulitis, complicated by perforation, colovesical fistula, pelvic abscess, status post drain placement.  Culture polymicrobial  including Enterococcus, Candida, Clostridium, and Bacteroides.  But with worsening leukocytosis went for laparotomy on 03/01/2021 Found to have gangrenous colon, fecal peritonitis, underwent Hartman's procedure colostomy and bladder fistula closure. Vanco cefepime and Flagyl changed to meropenem and  Fluconazole on 03/06/21-day 6   as leucocytosis improved a lot may be able to DC antibiotics on day 10-- 03/15/21  Had revision colostomy on 03/09/21  Colovesical fistula- s/p repair- has foley   Acute hypoxic respiratory failure remains intubated.  has tracheostomy Anemia  CKD ? Discussed the management with care team ID will sign off- call if needed

## 2021-03-12 NOTE — Consult Note (Signed)
[place] Nurse ostomy follow up Stoma type/location: LMQ colostomy Stomal assessment/size: 1 and 5/8 inches round, red, edematous, raised and slightly long (2cm) Peristomal assessment: intact with blanching erythema, deep crease below stoma. Treatment options for stomal/peristomal skin: skin barrier ring Output: soft, light brown effluent Ostomy pouching: 2pc. 2 and 3/4 inch pouching system with skin barrier ring Education provided: None today.  Enrolled patient in [place] [facility] Discharge program: No  Orders revised for Nursing with new ostomy supply numbers.  Education folder at bedside with measuring guide and pattern taped to front.   [place] Nurse wound follow up Wound type: follow up on pressure injury to left buttock Measurement: 0.2cm round deeply hued purple discoloration, no fluctuance, no elevation. Wound bed:N/A Drainage (amount, consistency, odor) None Periwound:intact Dressing procedure/placement/frequency: continue with turning and repositioning, cover with silicone bordered foam.Heels boots on, low air loss mattress replacement in place. Patient remains critically ill in the ICU.   [place] nursing team will follow, seeing every 7-10 days while in ICU and will remain available to this patient, the nursing and medical teams.  Ostomy teaching will begin when patient is able or if family members wish to participate   Thanks, Maudie Flakes, MSN, RN, Chancy Milroy, Arther Abbott  Pager# [phone]

## 2021-03-13 DIAGNOSIS — K5792 Diverticulitis of intestine, part unspecified, without perforation or abscess without bleeding: Secondary | ICD-10-CM | POA: Diagnosis not present

## 2021-03-13 DIAGNOSIS — N390 Urinary tract infection, site not specified: Secondary | ICD-10-CM | POA: Diagnosis not present

## 2021-03-13 DIAGNOSIS — J9601 Acute respiratory failure with hypoxia: Secondary | ICD-10-CM | POA: Diagnosis not present

## 2021-03-13 DIAGNOSIS — E43 Unspecified severe protein-calorie malnutrition: Secondary | ICD-10-CM | POA: Insufficient documentation

## 2021-03-13 DIAGNOSIS — Z01818 Encounter for other preprocedural examination: Secondary | ICD-10-CM | POA: Diagnosis not present

## 2021-03-13 LAB — MAGNESIUM: Magnesium: 1.9 mg/dL (ref 1.7–2.4)

## 2021-03-13 LAB — COMPREHENSIVE METABOLIC PANEL
ALT: 49 U/L — ABNORMAL HIGH (ref 0–44)
AST: 104 U/L — ABNORMAL HIGH (ref 15–41)
Albumin: 2.4 g/dL — ABNORMAL LOW (ref 3.5–5.0)
Alkaline Phosphatase: 63 U/L (ref 38–126)
Anion gap: 7 (ref 5–15)
BUN: 49 mg/dL — ABNORMAL HIGH (ref 8–23)
CO2: 24 mmol/L (ref 22–32)
Calcium: 8 mg/dL — ABNORMAL LOW (ref 8.9–10.3)
Chloride: 112 mmol/L — ABNORMAL HIGH (ref 98–111)
Creatinine, Ser: 0.92 mg/dL (ref 0.44–1.00)
GFR, Estimated: >60 mL/min (ref >60)
Glucose, Bld: 196 mg/dL — ABNORMAL HIGH (ref 70–99)
Potassium: 3.1 mmol/L — ABNORMAL LOW (ref 3.5–5.1)
Sodium: 143 mmol/L (ref 135–145)
Total Bilirubin: 0.7 mg/dL (ref 0.3–1.2)
Total Protein: 5.7 g/dL — ABNORMAL LOW (ref 6.5–8.1)

## 2021-03-13 LAB — CBC WITH DIFFERENTIAL/PLATELET
Abs Immature Granulocytes: 0.16 10*3/uL — ABNORMAL HIGH (ref 0.00–0.07)
Basophils Absolute: 0 10*3/uL (ref 0.0–0.1)
Basophils Relative: 0 %
Eosinophils Absolute: 0 10*3/uL (ref 0.0–0.5)
Eosinophils Relative: 0 %
HCT: 28.1 % — ABNORMAL LOW (ref 36.0–46.0)
Hemoglobin: 9.3 g/dL — ABNORMAL LOW (ref 12.0–15.0)
Immature Granulocytes: 1 %
Lymphocytes Relative: 3 %
Lymphs Abs: 0.6 10*3/uL — ABNORMAL LOW (ref 0.7–4.0)
MCH: 30.9 pg (ref 26.0–34.0)
MCHC: 33.1 g/dL (ref 30.0–36.0)
MCV: 93.4 fL (ref 80.0–100.0)
Monocytes Absolute: 0.7 10*3/uL (ref 0.1–1.0)
Monocytes Relative: 4 %
Neutro Abs: 16.2 10*3/uL — ABNORMAL HIGH (ref 1.7–7.7)
Neutrophils Relative %: 92 %
Platelets: 225 10*3/uL (ref 150–400)
RBC: 3.01 MIL/uL — ABNORMAL LOW (ref 3.87–5.11)
RDW: 17.4 % — ABNORMAL HIGH (ref 11.5–15.5)
WBC: 17.7 10*3/uL — ABNORMAL HIGH (ref 4.0–10.5)
nRBC: 0 % (ref 0.0–0.2)

## 2021-03-13 LAB — GLUCOSE, CAPILLARY
Glucose-Capillary: 114 mg/dL — ABNORMAL HIGH (ref 70–99)
Glucose-Capillary: 132 mg/dL — ABNORMAL HIGH (ref 70–99)
Glucose-Capillary: 137 mg/dL — ABNORMAL HIGH (ref 70–99)
Glucose-Capillary: 163 mg/dL — ABNORMAL HIGH (ref 70–99)
Glucose-Capillary: 164 mg/dL — ABNORMAL HIGH (ref 70–99)
Glucose-Capillary: 172 mg/dL — ABNORMAL HIGH (ref 70–99)

## 2021-03-13 LAB — LIPID PANEL
Cholesterol: 152 mg/dL (ref 0–200)
HDL: 34 mg/dL — ABNORMAL LOW (ref >40)
LDL Cholesterol: 93 mg/dL (ref 0–99)
Total CHOL/HDL Ratio: 4.5 RATIO
Triglycerides: 127 mg/dL (ref <150)
VLDL: 25 mg/dL (ref 0–40)

## 2021-03-13 LAB — PHOSPHORUS: Phosphorus: 2.6 mg/dL (ref 2.5–4.6)

## 2021-03-13 LAB — SURGICAL PATHOLOGY

## 2021-03-13 MED ORDER — MAGNESIUM SULFATE 2 GM/50ML IV SOLN
2.0000 g | Freq: Once | INTRAVENOUS | Status: AC
  Administered 2021-03-13: 2 g via INTRAVENOUS
  Filled 2021-03-13: qty 50

## 2021-03-13 MED ORDER — FENTANYL CITRATE (PF) 100 MCG/2ML IJ SOLN: 25.0000 ug | INTRAMUSCULAR | Status: DC | PRN

## 2021-03-13 MED ORDER — HYDROCORTISONE NA SUCCINATE PF 100 MG IJ SOLR
25.0000 mg | Freq: Three times a day (TID) | INTRAMUSCULAR | Status: AC
  Administered 2021-03-13 (×2): 25 mg via INTRAVENOUS
  Filled 2021-03-13 (×3): qty 2

## 2021-03-13 MED ORDER — ALPRAZOLAM 0.5 MG PO TABS
0.5000 mg | ORAL_TABLET | Freq: Two times a day (BID) | ORAL | Status: DC | PRN
  Filled 2021-03-13: qty 1

## 2021-03-13 MED ORDER — ALPRAZOLAM 0.5 MG PO TABS
0.5000 mg | ORAL_TABLET | Freq: Two times a day (BID) | ORAL | Status: DC | PRN
  Administered 2021-03-13 – 2021-03-16 (×4): 0.5 mg
  Filled 2021-03-13 (×3): qty 1

## 2021-03-13 MED ORDER — POTASSIUM CHLORIDE 20 MEQ PO PACK
20.0000 meq | PACK | ORAL | Status: AC
Start: 2021-03-13 — End: 2021-03-13
  Administered 2021-03-13 (×2): 20 meq
  Filled 2021-03-13 (×2): qty 1

## 2021-03-13 MED ORDER — SODIUM CHLORIDE 0.9 % IV SOLN
1.0000 g | Freq: Three times a day (TID) | INTRAVENOUS | Status: AC
  Administered 2021-03-13 – 2021-03-16 (×11): 1 g via INTRAVENOUS
  Filled 2021-03-13 (×11): qty 1

## 2021-03-13 MED ORDER — POTASSIUM CHLORIDE 10 MEQ/50ML IV SOLN
10.0000 meq | INTRAVENOUS | Status: AC
Start: 2021-03-13 — End: 2021-03-13
  Administered 2021-03-13 (×4): 10 meq via INTRAVENOUS
  Filled 2021-03-13 (×4): qty 50

## 2021-03-13 MED ORDER — HYDROCORTISONE NA SUCCINATE PF 100 MG IJ SOLR
25.0000 mg | Freq: Every day | INTRAMUSCULAR | Status: AC
  Administered 2021-03-15: 25 mg via INTRAVENOUS
  Filled 2021-03-13: qty 2

## 2021-03-13 MED ORDER — HYDROCORTISONE NA SUCCINATE PF 100 MG IJ SOLR
25.0000 mg | Freq: Two times a day (BID) | INTRAMUSCULAR | Status: AC
  Administered 2021-03-14 (×2): 25 mg via INTRAVENOUS
  Filled 2021-03-13 (×2): qty 2

## 2021-03-13 MED ORDER — ENOXAPARIN SODIUM 40 MG/0.4ML ~~LOC~~ SOLN
40.0000 mg | SUBCUTANEOUS | Status: DC
  Administered 2021-03-13 – 2021-03-22 (×10): 40 mg via SUBCUTANEOUS
  Filled 2021-03-13 (×10): qty 0.4

## 2021-03-13 NOTE — Progress Notes (Signed)
Placed patient on trach collar, tolerating well at this time. Will continue to monitor.

## 2021-03-13 NOTE — Progress Notes (Signed)
PHARMACY NOTE:  ANTIMICROBIAL RENAL DOSAGE ADJUSTMENT  Current antimicrobial regimen includes a mismatch between antimicrobial dosage and estimated renal function.  As per policy approved by the Pharmacy & Therapeutics and Medical Executive Committees, the antimicrobial dosage will be adjusted accordingly.  Current antimicrobial dosage:  Meropenem 1 gram IV every 12 hours  Indication: Intra-abdominal infection  Renal Function:  Estimated Creatinine Clearance: 53.6 mL/min (by C-G formula based on SCr of 0.92 mg/dL).    Antimicrobial dosage has been changed to:  Meropenem 1 gram IV every 8 hours  Thank you for allowing pharmacy to be a part of this patient's care.  Dallie Piles, PharmD Pharmacy Resident  03/13/2021 8:54 AM

## 2021-03-13 NOTE — Progress Notes (Signed)
Tolerated Trach Collar for 5 hours, placed back on vent to rest.

## 2021-03-13 NOTE — Progress Notes (Signed)
[place] Hospital Day(s): 20.   Post op day(s): 4 Days Post-Op.   Interval History:  Patient seen and examined; more alert; arouses to verbal stimuli No acute events or new complaints overnight.  Patient shakes her head "no" when asked about pain Continues to have leukocytosis to 17.7K; no fevers x24 hours Maintaining renal function, sCr - 0.92, UO - 2.6L Mild hypokalemia to 3.1; no other electrolyte derangements Continues on Meropenem  On tube feedings; goal rate Having colostomy function  Vital signs in last 24 hours: [min-max] current  Temp:  [98.8 F (37.1 C)-100 F (37.8 C)] 98.8 F (37.1 C) (04/05 0400) Pulse Rate:  [46-119] 63 (04/05 0600) Resp:  [9-26] 26 (04/05 0600) BP: (105-158)/(53-117) 158/117 (04/05 0600) SpO2:  [97 %-100 %] 100 % (04/05 0734) FiO2 (%):  [28 %-30 %] 30 % (04/05 0734)     Height: 5' 4.02" (162.6 cm) Weight: 67.2 kg BMI (Calculated): 25.42   Intake/Output last 2 shifts:  04/04 0701 - 04/05 0700 In: 918.3 [I.V.:31.5; NG/GT:605; IV Piggyback:281.8] Out: 2675 [Urine:2675]   Physical Exam:  Constitutional: alert, arouses to verbal stimuli; NAD Respiratory: s/p tracheostomy Cardiovascular: tachycardic and sinus rhythm  Gastrointestinal: soft, non-tender, and non-distended. Colostomy in the left mid-abdomen, pink/patent, stool in bac Integumentary: Midline wound healing via secondary intention; no erythema  Labs:  CBC Latest Ref Rng & Units 03/13/2021 03/12/2021 03/11/2021  WBC 4.0 - 10.5 K/uL 17.7(H) 12.7(H) 18.7(H)  Hemoglobin 12.0 - 15.0 g/dL 9.3(L) 8.6(L) 9.7(L)  Hematocrit 36.0 - 46.0 % 28.1(L) 26.7(L) 30.9(L)  Platelets 150 - 400 K/uL 225 180 196   CMP Latest Ref Rng & Units 03/13/2021 03/12/2021 03/11/2021  Glucose 70 - 99 mg/dL 196(H) 262(H) 182(H)  BUN 8 - 23 mg/dL 49(H) 48(H) 41(H)  Creatinine 0.44 - 1.00 mg/dL 0.92 0.99 0.87  Sodium 135 - 145 mmol/L 143 145 144  Potassium 3.5 - 5.1 mmol/L 3.1(L) 4.2  4.6  Chloride 98 - 111 mmol/L 112(H) 116(H) 111  CO2 22 - 32 mmol/L $RemoveB'24 25 27  '[id number]$ Calcium 8.9 - 10.3 mg/dL 8.0(L) 7.9(L) 7.4(L)  Total Protein 6.5 - 8.1 g/dL 5.7(L) 5.5(L) -  Total Bilirubin 0.3 - 1.2 mg/dL 0.7 0.7 -  Alkaline Phos 38 - 126 U/L 63 70 -  AST 15 - 41 U/L 104(H) 62(H) -  ALT 0 - 44 U/L 49(H) 32 -    Imaging studies:No new pertinent imaging studies   Assessment/Plan:  70 y.o. female 12 Days Post-Op s/p Hartman's Procedurefor complicated diverticulitis with gangrenous changes and perforation with feculent peritonitis and 4 days s/p colostomy revision    - Okay to continue goal rate tube feedings - Appreciate PCCM assistance; vasopressor and ventilator support - Continue Abx (Meropenem); ID on board - Monitor abdominal examination - Monitor colostomy output; WOC RN following - Monitor leukocytosis; worsening - Maintain foley catheter; monitor UO - Midline wound care: pack with iodoform gauze or saline moistened gauze BID, cover with ABD pad and secure   All of the above findings and recommendations were discussed with the medical team.   -- Edison Simon, PA-C [facility] 03/13/2021, 8:03 AM [phone] M-F: 7am - 4pm

## 2021-03-13 NOTE — Progress Notes (Signed)
[facility] for Electrolyte Monitoring and Replacement   Recent Labs: Potassium (mmol/L)  Date Value  03/13/2021 3.1 (L)   Magnesium (mg/dL)  Date Value  03/13/2021 1.9   Calcium (mg/dL)  Date Value  03/13/2021 8.0 (L)   Albumin (g/dL)  Date Value  03/13/2021 2.4 (L)   Phosphorus (mg/dL)  Date Value  03/13/2021 2.6   Sodium (mmol/L)  Date Value  03/13/2021 143   Corrected Ca: 9.3  Assessment: Desiree Madden is a 70 y.o. female admitted on 02/21/2021 with intra-abdominal infection (diverticulitis w/ fecal perforation). Patient septic with acute diverticulitis abdominal abscess with colovesical fistula and enterococcus urinary tract infection which was further complicated by the development of complicated diverticulitis with perforation and gangrenous changes requiring Hartman's procedure performed on 03/01/21. Pt developed severe septic shock after procedure requiring vasopressors. Additionally, pt has metabolic acidosis. PMH includes CAD s/p CABG (2015), HTN, hypothyroidism, colon bladder fistula, diverticulitis, and ulcerative colitis. Pharmacy has been consulted for electrolyte monitoring.  Patient was extubated and re-intubated 3/29, tracheostomy placed 3/31  Nutrition: Continous tube feeds restarted 3/30 @ 55 mL/hr and Prosource daily   Fluids: Free water 100 mL per tube q4h  Goal of Therapy:  K 4.0-5.1 Mg 2.0-2.4 Electrolytes WNL  Plan:  -- Mg 1.9 - NP ordered Mg 2g IV x 1 dose  -- K 3.1 - NP ordered KCl 10 mEq IV x 4 doses and 20 mEq per tube x 2 doses -- Other electrolytes WNL (no further replenishment warranted) -- F/u electrolytes with AM labs  [name], PharmD Pharmacy Resident  03/13/2021 7:32 AM

## 2021-03-13 NOTE — Progress Notes (Signed)
NAME:  Desiree Madden, MRN:  [id number], DOB:  July 14, 1951, LOS: 23 ADMISSION DATE:  02/21/2021  BRIEF SYNOPSIS  Brief Pt Description:  70 y.o. Female admitted 02/21/2021 due to Diverticulitis with Colovesical fistula and UTI. On 03/01/21 she developed complicated Diverticulitis with perforation requiring Sigmoid colectomy with end colostomy and Hartman's procedure. Post procedure she returns to ICU and remains intubated with Septic Shock requiring multiple vasopressors.    Significant Hospital Events: Including procedures, antibiotic start and stop dates in addition to other pertinent events    02/21/2021:Admitted to Med/Surg unit with Diverticulitis and UTI  02/28/2021:LLQ abdominal drain placed by IR to pelvic abscess  03/01/2021:Worsening Leukocytosis despite drain placement. Found to have complicated Diverticulitis with perforation requiring Sigmoid Colectomy with end colostomy and Hartman's Procedure. Post procedure she returns to ICU and remains intubated with Septic Shock requiring multiple vasopressors. Left Femoral CVC and Arterial lines placed emergently  03/04/2021:Severe anasarca, albumin 1.8 DX of NSTEMI  3/28 failure to wean from vent  3/29 severe agitation, septic shock on pressors  3/29 failed extubation, re intubated daughter updated  3/30 ENT consulted  3/31 [facility] placed, daughter updated, made DNR status  4/1 colostomy revision, remains on vent, daughter updated  4/2 failed weaning trials   4/4 patient is on 5/5 back to put her on SAT SBT still minimally responsive and now looking for an LTAC bed.  4/5 surgical clips removed patient fascia is open but muscles are closed needs twice a day wet-to-dry packing ID saw patient yesterday and this is the recommendations Vanco cefepime and Flagyl changed to meropenem and  Fluconazole on 03/06/21-day 6    as leucocytosis improved a lot may be able to DC antibiotics on day 10-- 03/15/21    Cultures:   02/21/21: SARS CoV-2 PCR>>negative 02/21/21: Influenza PCR>>negative 02/21/2021: Urine>>ENTEROCOCCUS FAECALIS, ENTEROCOCCUS FAECIUM(sensitive to vancomycin and ampicillin) 02/22/2021: HIV screen>> nonreactive 02/28/2021: LLQ Abdominal drain>>Enterococcusavium, other polymicrobial Candida albicans 03/01/2021: MRSA PCR>> negative 03/01/2021: Blood culture x2>>  Antimicrobials:  Ciprofloxacin 3/16>>3/18 Unasyn 3/23>>3/23 Flagyl 3/16>>3/18; 3/24>>3/29 Cefepime 3/24>>3/29 Vancomycin 3/24>>3/29 UNASYN 3/29-->      Interim History / Subjective:  Remains intubated On vent S/p trach Revised colostomy On precedex    Objective   Blood pressure (!) 158/117, pulse 63, temperature 98.8 F (37.1 C), temperature source Axillary, resp. rate (!) 26, height 5' 4.02" (1.626 m), weight 67.2 kg, SpO2 100 %.    Vent Mode: Spontaneous FiO2 (%):  [30 %] 30 % Set Rate:  [15 bmp] 15 bmp Vt Set:  [450 mL] 450 mL PEEP:  [5 cmH20] 5 cmH20 Pressure Support:  [5 cmH20-10 cmH20] 5 cmH20   Intake/Output Summary (Last 24 hours) at 03/13/2021 1236 Last data filed at 03/13/2021 1200 Gross per 24 hour  Intake 832.46 ml  Output 4000 ml  Net -3167.54 ml   Filed Weights   02/27/21 0900 02/28/21 1014 03/01/21 1511  Weight: 66.9 kg 67.2 kg 67.2 kg      REVIEW OF SYSTEMS  PATIENT IS UNABLE TO PROVIDE COMPLETE REVIEW OF SYSTEMS DUE TO SEVERE CRITICAL ILLNESS AND TOXIC METABOLIC ENCEPHALOPATHY   PHYSICAL EXAMINATION:  GENERAL:critically ill appearing, +resp distress HEAD: Normocephalic, atraumatic.  EYES: Pupils equal, round, reactive to light.  No scleral icterus.  MOUTH: Moist mucosal membrane. NECK: Supple.s/p t rach PULMONARY: +rhonchi, +wheezing CARDIOVASCULAR: S1 and S2. Regular rate and rhythm. No murmurs, rubs, or gallops.  GASTROINTESTINAL: colostomy +distention  MUSCULOSKELETAL: +edema.  NEUROLOGIC: obtunded SKIN:intact,warm,dry   Labs/imaging that I havepersonally reviewed   (right  click and "Reselect all SmartList Selections" daily)    Resolved Hospital Problem list   na    ASSESSMENT AND PLAN SYNOPSIS  70 yo with acute and severe septic shock with hemorrhagic shock with acute hypoxic resp failure with septic shock and NSTEMI due to complicated Complicated Diverticulitis/Pelvic Abscess with Perforation &Gangrene of the Sigmoid Colon, Colovesical Fistula failed trial of extubation and re-intubated 3/29 Trach on 3/31 Patient with open wound fascia surgical clips removed now with dressing twice a day wet-to-dry  Severe ACUTE Hypoxic and Hypercapnic Respiratory Failure -continue Mechanical Ventilator support -continue Bronchodilator Therapy -Wean Fio2 and PEEP as tolerated -VAP/VENT bundle implementation Failure to wean from vent Will wean sedation and PS mode when daughter arrives to bedside   CARDIAC FAILURE-acute  systolic dysfunction NSTEMI -oxygen as needed -Lasix as tolerated -follow up cardiac enzymes as indicated (Concern for basal to mid inferior wall  hypokinesis).    CARDIAC ICU monitoring   ACUTE KIDNEY INJURY/Renal Failure improved -continue Foley Catheter-assess need -Avoid nephrotoxic agents -Follow urine output, BMP -Ensure adequate renal perfusion, optimize oxygenation -Renal dose medications   NEUROLOGY Acute toxic metabolic encephalopathy Wean sedation started on precedex   SEPTIC SHOCK -use vasopressors to keep MAP>65 as needed   INFECTIOUS DISEASE -continue antibiotics as prescribed -follow up cultures -follow up ID consultation +CANDIDA ALBICANS +ENTEROCOCCUS AVIUM +CLOSTRIDIUM +BACTEROIDES Vanco cefepime and Flagyl changed to meropenem and  Fluconazole on 03/06/21-day 6   as leucocytosis improved a lot may be able to DC antibiotics on day 10-- 03/15/21  ENDO - ICU hypoglycemic\Hyperglycemia protocol -check FSBS per protocol   GI GI PROPHYLAXIS as indicated  NUTRITIONAL STATUS DIET-->TF's as  tolerated Constipation protocol as indicated   ELECTROLYTES -follow labs as needed -replace as needed -pharmacy consultation and following     Best practice (right click and "Reselect all SmartList Selections" daily)  Diet: NPO Pain/Anxiety/Delirium protocol (if indicated): Yes (RASS goal -1) VAP protocol (if indicated): Yes DVT prophylaxis: SCD GI prophylaxis: PPI Glucose control: SSI Yes Central venous access: Yes, and it is still needed Arterial line: N/A Foley: N/A Mobility: bed rest PT consulted: N/A Code Status:DNR     Labs   CBC: Recent Labs  Lab 03/09/21 0453 03/10/21 0559 03/11/21 0357 03/12/21 0431 03/13/21 0442  WBC 17.8* 21.6* 18.7* 12.7* 17.7*  NEUTROABS  --   --   --   --  16.2*  HGB 8.9* 9.6* 9.7* 8.6* 9.3*  HCT 27.2* 30.5* 30.9* 26.7* 28.1*  MCV 92.8 93.8 94.8 95.4 93.4  PLT 175 217 196 180 825    Basic Metabolic Panel: Recent Labs  Lab 03/09/21 0453 03/10/21 0559 03/10/21 2045 03/11/21 0357 03/12/21 0431 03/13/21 0442  NA 148* 145  --  144 145 143  K 3.3* 3.5 3.8 4.6 4.2 3.1*  CL 112* 109  --  111 116* 112*  CO2 30 30  --  $R'27 25 24  'lf$ GLUCOSE 178* 109*  --  182* 262* 196*  BUN 47* 43*  --  41* 48* 49*  CREATININE 1.29* 0.90  --  0.87 0.99 0.92  CALCIUM 8.0* 7.7*  --  7.4* 7.9* 8.0*  MG 1.8 2.0  --  1.7 1.8 1.9  PHOS 2.5 2.5  --  2.1* 2.6 2.6   GFR: Estimated Creatinine Clearance: 53.6 mL/min (by C-G formula based on SCr of 0.92 mg/dL). Recent Labs  Lab 03/10/21 0559 03/11/21 0357 03/12/21 0431 03/13/21 0442  WBC 21.6* 18.7* 12.7* 17.7*    Liver Function Tests: Recent  Labs  Lab 03/07/21 1850 03/12/21 0431 03/13/21 0442  AST 29 62* 104*  ALT 15 32 49*  ALKPHOS 35* 70 63  BILITOT 1.3* 0.7 0.7  PROT 4.7* 5.5* 5.7*  ALBUMIN 3.1* 2.3* 2.4*   No results for input(s): LIPASE, AMYLASE in the last 168 hours. No results for input(s): AMMONIA in the last 168 hours.  ABG    Component Value Date/Time   PHART  7.35 03/06/2021 1811   PCO2ART 46 03/06/2021 1811   PO2ART 100 03/06/2021 1811   HCO3 25.4 03/06/2021 1811   ACIDBASEDEF 0.4 03/06/2021 1811   O2SAT 97.4 03/06/2021 1811     Coagulation Profile: No results for input(s): INR, PROTIME in the last 168 hours.  Cardiac Enzymes: No results for input(s): CKTOTAL, CKMB, CKMBINDEX, TROPONINI in the last 168 hours.  HbA1C: No results found for: HGBA1C  CBG: Recent Labs  Lab 03/12/21 2037 03/12/21 2332 03/13/21 0322 03/13/21 0717 03/13/21 1139  GLUCAP 176* 167* 172* 163* 137*    Allergies Allergies  Allergen Reactions  . Baclofen     Other reaction(s): Headache  . Sertraline     Other reaction(s): Other (See Comments), Other (See Comments) Muscle aches Muscle aches   . Sulfa Antibiotics     Other reaction(s): Other (See Comments), Other (See Comments) Pt was told not to take it due to colitis Pt was told not to take it due to colitis   . Ibuprofen Nausea And Vomiting  . Trazodone And Nefazodone        DVT/GI PRX  assessed I Assessed the need for Labs I Assessed the need for Foley I Assessed the need for Central Venous Line Family Discussion when available I Assessed the need for Mobilization I made an Assessment of medications to be adjusted accordingly Safety Risk assessment completed  [place] ICU TEAM Looking for LTAC bed     Overall, patient is critically ill, prognosis is guarded.  Patient with Multiorgan failure and at high risk for cardiac arrest and death.   DNR status, poor prognosis

## 2021-03-13 NOTE — Progress Notes (Signed)
Day 6, sutures removed from trach. Tolerated well, no complications.

## 2021-03-14 DIAGNOSIS — R0902 Hypoxemia: Secondary | ICD-10-CM

## 2021-03-14 DIAGNOSIS — N390 Urinary tract infection, site not specified: Secondary | ICD-10-CM | POA: Diagnosis not present

## 2021-03-14 DIAGNOSIS — J9601 Acute respiratory failure with hypoxia: Secondary | ICD-10-CM | POA: Diagnosis not present

## 2021-03-14 DIAGNOSIS — K5792 Diverticulitis of intestine, part unspecified, without perforation or abscess without bleeding: Secondary | ICD-10-CM | POA: Diagnosis not present

## 2021-03-14 DIAGNOSIS — Z978 Presence of other specified devices: Secondary | ICD-10-CM | POA: Diagnosis not present

## 2021-03-14 LAB — CBC WITH DIFFERENTIAL/PLATELET
Abs Immature Granulocytes: 0.09 10*3/uL — ABNORMAL HIGH (ref 0.00–0.07)
Basophils Absolute: 0 10*3/uL (ref 0.0–0.1)
Basophils Relative: 0 %
Eosinophils Absolute: 0.1 10*3/uL (ref 0.0–0.5)
Eosinophils Relative: 1 %
HCT: 28.5 % — ABNORMAL LOW (ref 36.0–46.0)
Hemoglobin: 8.9 g/dL — ABNORMAL LOW (ref 12.0–15.0)
Immature Granulocytes: 1 %
Lymphocytes Relative: 8 %
Lymphs Abs: 1 10*3/uL (ref 0.7–4.0)
MCH: 30.3 pg (ref 26.0–34.0)
MCHC: 31.2 g/dL (ref 30.0–36.0)
MCV: 96.9 fL (ref 80.0–100.0)
Monocytes Absolute: 0.9 10*3/uL (ref 0.1–1.0)
Monocytes Relative: 7 %
Neutro Abs: 10.6 10*3/uL — ABNORMAL HIGH (ref 1.7–7.7)
Neutrophils Relative %: 83 %
Platelets: 213 10*3/uL (ref 150–400)
RBC: 2.94 MIL/uL — ABNORMAL LOW (ref 3.87–5.11)
RDW: 17.9 % — ABNORMAL HIGH (ref 11.5–15.5)
WBC: 12.8 10*3/uL — ABNORMAL HIGH (ref 4.0–10.5)
nRBC: 0 % (ref 0.0–0.2)

## 2021-03-14 LAB — GLUCOSE, CAPILLARY
Glucose-Capillary: 111 mg/dL — ABNORMAL HIGH (ref 70–99)
Glucose-Capillary: 112 mg/dL — ABNORMAL HIGH (ref 70–99)
Glucose-Capillary: 116 mg/dL — ABNORMAL HIGH (ref 70–99)
Glucose-Capillary: 118 mg/dL — ABNORMAL HIGH (ref 70–99)
Glucose-Capillary: 119 mg/dL — ABNORMAL HIGH (ref 70–99)
Glucose-Capillary: 227 mg/dL — ABNORMAL HIGH (ref 70–99)
Glucose-Capillary: 97 mg/dL (ref 70–99)

## 2021-03-14 LAB — COMPREHENSIVE METABOLIC PANEL
ALT: 163 U/L — ABNORMAL HIGH (ref 0–44)
AST: 309 U/L — ABNORMAL HIGH (ref 15–41)
Albumin: 2.3 g/dL — ABNORMAL LOW (ref 3.5–5.0)
Alkaline Phosphatase: 85 U/L (ref 38–126)
Anion gap: 5 (ref 5–15)
BUN: 46 mg/dL — ABNORMAL HIGH (ref 8–23)
CO2: 24 mmol/L (ref 22–32)
Calcium: 7.9 mg/dL — ABNORMAL LOW (ref 8.9–10.3)
Chloride: 117 mmol/L — ABNORMAL HIGH (ref 98–111)
Creatinine, Ser: 0.64 mg/dL (ref 0.44–1.00)
GFR, Estimated: >60 mL/min (ref >60)
Glucose, Bld: 118 mg/dL — ABNORMAL HIGH (ref 70–99)
Potassium: 3.5 mmol/L (ref 3.5–5.1)
Sodium: 146 mmol/L — ABNORMAL HIGH (ref 135–145)
Total Bilirubin: 0.7 mg/dL (ref 0.3–1.2)
Total Protein: 5.6 g/dL — ABNORMAL LOW (ref 6.5–8.1)

## 2021-03-14 LAB — PHOSPHORUS: Phosphorus: 2.5 mg/dL (ref 2.5–4.6)

## 2021-03-14 LAB — MAGNESIUM: Magnesium: 2.2 mg/dL (ref 1.7–2.4)

## 2021-03-14 MED ORDER — POTASSIUM CHLORIDE 10 MEQ/50ML IV SOLN
10.0000 meq | INTRAVENOUS | Status: AC
  Administered 2021-03-14 (×4): 10 meq via INTRAVENOUS
  Filled 2021-03-14 (×4): qty 50

## 2021-03-14 MED ORDER — OXYCODONE HCL 5 MG PO TABS
5.0000 mg | ORAL_TABLET | Freq: Four times a day (QID) | ORAL | Status: DC | PRN
  Administered 2021-03-14 – 2021-03-19 (×5): 5 mg
  Filled 2021-03-14 (×6): qty 1

## 2021-03-14 MED ORDER — OXYCODONE HCL 5 MG PO TABS
5.0000 mg | ORAL_TABLET | Freq: Four times a day (QID) | ORAL | Status: DC | PRN
  Administered 2021-03-14: 5 mg via ORAL
  Filled 2021-03-14: qty 1

## 2021-03-14 MED ORDER — POTASSIUM CHLORIDE 20 MEQ PO PACK
20.0000 meq | PACK | Freq: Once | ORAL | Status: AC
  Administered 2021-03-14: 20 meq
  Filled 2021-03-14: qty 1

## 2021-03-14 NOTE — Progress Notes (Signed)
Patient placed on 28%/ 5L ATC with saturations of 100%, tolerating well at this time.

## 2021-03-14 NOTE — Progress Notes (Signed)
[place] for Electrolyte Monitoring and Replacement   Recent Labs: Potassium (mmol/L)  Date Value  03/14/2021 3.5   Magnesium (mg/dL)  Date Value  03/14/2021 2.2   Calcium (mg/dL)  Date Value  03/14/2021 7.9 (L)   Albumin (g/dL)  Date Value  03/14/2021 2.3 (L)   Phosphorus (mg/dL)  Date Value  03/14/2021 2.5   Sodium (mmol/L)  Date Value  03/14/2021 146 (H)   Corrected Ca: 9.3  Assessment: Desiree Madden is a 70 y.o. female admitted on 02/21/2021 with intra-abdominal infection (diverticulitis w/ fecal perforation). Patient septic with acute diverticulitis abdominal abscess with colovesical fistula and enterococcus urinary tract infection which was further complicated by the development of complicated diverticulitis with perforation and gangrenous changes requiring Hartman's procedure performed on 03/01/21. Pt developed severe septic shock after procedure requiring vasopressors. Additionally, pt has metabolic acidosis. PMH includes CAD s/p CABG (2015), HTN, hypothyroidism, colon bladder fistula, diverticulitis, and ulcerative colitis. Pharmacy has been consulted for electrolyte monitoring.  Patient was extubated and re-intubated 3/29, tracheostomy placed 3/31  Nutrition: Continous tube feeds @ 55 mL/hr and Prosource daily   Goal of Therapy:  K 4.0-5.1 Mg 2.0-2.4 Electrolytes WNL  Plan:  -- K 3.5 - ordered KCl 10 mEq IV x 4 doses and 20 mEq per tube x 1 dose -- Na 146 - continue free water 100 mL per tube q4h -- Other electrolytes WNL (no further replenishment warranted) -- F/u electrolytes with AM labs  [name], PharmD Pharmacy Resident  03/14/2021 7:27 AM

## 2021-03-14 NOTE — TOC Progression Note (Signed)
Transition of Care [facility]) - Progression Note    Patient Details  Name: Delecia Vastine MRN: [id number] Date of Birth: [birth year]  Transition of Care [facility]) CM/SW [place], [place] Phone Number:  [phone] 03/14/2021, 11:36 AM  Clinical Narrative:     CSW spoke with patient's Annabelle Harman (Daughter) [phone] with update on patient disposition.  Ms. Armida Sans is in agreement with LTACH placement, she stated her fist choice was [facility] in [place].  CSW stated I would contact [facility] rep and I would also give her Ms. Brunquell's contact information.  Ms. Armida Sans verbalized understanding.        Expected Discharge Plan and Services                                                 Social Determinants of Health (SDOH) Interventions    Readmission Risk Interventions No flowsheet data found.

## 2021-03-14 NOTE — Plan of Care (Signed)
Neuro: patient's mentation becoming clearer, has to be reminded to not try to talk, nods and answers appropriately, still weak but making efforts to move extremities Resp: tolerated trach collar for most of the day, getting more comfortable with trach being in place CV: afebrile, vital signs stable, occasional arrhythmias GIGU: colostomy patent, tolerating tube feeds well, foley remains in place Skin: dressing change complete, edema/weeping improving Social: Daughter visited bedside today, all questions and concerns addressed  Problem: Education: Goal: Knowledge of General Education information will improve Description: Including pain rating scale, medication(s)/side effects and non-pharmacologic comfort measures Outcome: Progressing   Problem: Health Behavior/Discharge Planning: Goal: Ability to manage health-related needs will improve Outcome: Progressing   Problem: Clinical Measurements: Goal: Ability to maintain clinical measurements within normal limits will improve Outcome: Progressing Goal: Will remain free from infection Outcome: Progressing Goal: Diagnostic test results will improve Outcome: Progressing Goal: Respiratory complications will improve Outcome: Progressing Goal: Cardiovascular complication will be avoided Outcome: Progressing   Problem: Activity: Goal: Risk for activity intolerance will decrease Outcome: Progressing   Problem: Nutrition: Goal: Adequate nutrition will be maintained Outcome: Progressing   Problem: Coping: Goal: Level of anxiety will decrease Outcome: Progressing   Problem: Elimination: Goal: Will not experience complications related to bowel motility Outcome: Progressing Goal: Will not experience complications related to urinary retention Outcome: Progressing   Problem: Pain Managment: Goal: General experience of comfort will improve Outcome: Progressing   Problem: Safety: Goal: Ability to remain free from injury will  improve Outcome: Progressing   Problem: Skin Integrity: Goal: Risk for impaired skin integrity will decrease Outcome: Progressing

## 2021-03-14 NOTE — Progress Notes (Signed)
NAME:  Desiree Madden, MRN:  [id number], DOB:  Mar 01, 1951, LOS: 13 ADMISSION DATE:  02/21/2021  BRIEF SYNOPSIS  Brief Pt Description:  70 y.o. Female admitted 02/21/2021 due to Diverticulitis with Colovesical fistula and UTI. On 03/01/21 she developed complicated Diverticulitis with perforation requiring Sigmoid colectomy with end colostomy and Hartman's procedure. Post procedure she returns to ICU and remains intubated with Septic Shock requiring multiple vasopressors.    Significant Hospital Events: Including procedures, antibiotic start and stop dates in addition to other pertinent events    02/21/2021:Admitted to Med/Surg unit with Diverticulitis and UTI  02/28/2021:LLQ abdominal drain placed by IR to pelvic abscess  03/01/2021:Worsening Leukocytosis despite drain placement. Found to have complicated Diverticulitis with perforation requiring Sigmoid Colectomy with end colostomy and Hartman's Procedure. Post procedure she returns to ICU and remains intubated with Septic Shock requiring multiple vasopressors. Left Femoral CVC and Arterial lines placed emergently  03/04/2021:Severe anasarca, albumin 1.8 DX of NSTEMI  3/28 failure to wean from vent  3/29 severe agitation, septic shock on pressors  3/29 failed extubation, re intubated daughter updated  3/30 ENT consulted  3/31 [facility] placed, daughter updated, made DNR status  4/1 colostomy revision, remains on vent, daughter updated  4/2 failed weaning trials   4/4 patient is on 5/5 back to put her on SAT SBT still minimally responsive and now looking for an LTAC bed.  4/5 surgical clips removed patient fascia is open but muscles are closed needs twice a day wet-to-dry packing ID saw patient yesterday and this is the recommendations Vanco cefepime and Flagyl changed to meropenem and  Fluconazole on 03/06/21-day 6    as leucocytosis improved a lot may be able to DC antibiotics on day 10-- 03/15/21  4/6 no major events  overnight patient had beta-blocker restarted and here episodes of V. tach clearance has decreased.    Cultures:  02/21/21: SARS CoV-2 PCR>>negative 02/21/21: Influenza PCR>>negative 02/21/2021: Urine>>ENTEROCOCCUS FAECALIS, ENTEROCOCCUS FAECIUM(sensitive to vancomycin and ampicillin) 02/22/2021: HIV screen>> nonreactive 02/28/2021: LLQ Abdominal drain>>Enterococcusavium, other polymicrobial Candida albicans 03/01/2021: MRSA PCR>> negative 03/01/2021: Blood culture x2>>  Antimicrobials:  Ciprofloxacin 3/16>>3/18 Unasyn 3/23>>3/23 Flagyl 3/16>>3/18; 3/24>>3/29 Cefepime 3/24>>3/29 Vancomycin 3/24>>3/29 UNASYN 3/29-->      Interim History / Subjective:  Remains intubated On vent S/p trach Revised colostomy On precedex    Objective   Blood pressure 127/74, pulse 85, temperature 98 F (36.7 C), temperature source Oral, resp. rate 17, height 5' 4.02" (1.626 m), weight 67.2 kg, SpO2 100 %.    Vent Mode: PRVC FiO2 (%):  [28 %-30 %] 28 % Set Rate:  [15 bmp] 15 bmp Vt Set:  [450 mL] 450 mL PEEP:  [5 cmH20] 5 cmH20 Pressure Support:  [5 cmH20] 5 cmH20   Intake/Output Summary (Last 24 hours) at 03/14/2021 0955 Last data filed at 03/14/2021 0700 Gross per 24 hour  Intake 3628.8 ml  Output 3030 ml  Net 598.8 ml   Filed Weights   02/27/21 0900 02/28/21 1014 03/01/21 1511  Weight: 66.9 kg 67.2 kg 67.2 kg      REVIEW OF SYSTEMS  PATIENT IS UNABLE TO PROVIDE COMPLETE REVIEW OF SYSTEMS DUE TO SEVERE CRITICAL ILLNESS AND TOXIC METABOLIC ENCEPHALOPATHY   PHYSICAL EXAMINATION:  GENERAL:critically ill appearing, +resp distress HEAD: Normocephalic, atraumatic.  EYES: Pupils equal, round, reactive to light.  No scleral icterus.  MOUTH: Moist mucosal membrane. NECK: Supple.s/p t rach PULMONARY: +rhonchi, +wheezing CARDIOVASCULAR: S1 and S2. Regular rate and rhythm. No murmurs, rubs, or gallops.  GASTROINTESTINAL: colostomy +distention  MUSCULOSKELETAL: +edema.   NEUROLOGIC: obtunded SKIN:intact,warm,dry   Labs/imaging that I havepersonally reviewed  (right click and "Reselect all SmartList Selections" daily)    Resolved Hospital Problem list   na    ASSESSMENT AND PLAN SYNOPSIS  70 yo with acute and severe septic shock with hemorrhagic shock with acute hypoxic resp failure with septic shock and NSTEMI due to complicated Complicated Diverticulitis/Pelvic Abscess with Perforation &Gangrene of the Sigmoid Colon, Colovesical Fistula failed trial of extubation and re-intubated 3/29 Trach on 3/31 Patient with open wound fascia surgical clips removed now with dressing twice a day wet-to-dry  Severe ACUTE Hypoxic and Hypercapnic Respiratory Failure -continue Mechanical Ventilator support -continue Bronchodilator Therapy -Wean Fio2 and PEEP as tolerated -VAP/VENT bundle implementation Failure to wean from vent Will wean sedation and PS mode when daughter arrives to bedside   CARDIAC FAILURE-acute  systolic dysfunction NSTEMI -oxygen as needed -Lasix as tolerated -follow up cardiac enzymes as indicated (Concern for basal to mid inferior wall  hypokinesis).    CARDIAC ICU monitoring   ACUTE KIDNEY INJURY/Renal Failure improved -continue Foley Catheter-assess need -Avoid nephrotoxic agents -Follow urine output, BMP -Ensure adequate renal perfusion, optimize oxygenation -Renal dose medications   NEUROLOGY Acute toxic metabolic encephalopathy Wean sedation started on precedex   SEPTIC SHOCK -use vasopressors to keep MAP>65 as needed   INFECTIOUS DISEASE -continue antibiotics as prescribed -follow up cultures -follow up ID consultation +CANDIDA ALBICANS +ENTEROCOCCUS AVIUM +CLOSTRIDIUM +BACTEROIDES Vanco cefepime and Flagyl changed to meropenem and  Fluconazole on 03/06/21-day 6   as leucocytosis improved a lot may be able to DC antibiotics on day 10-- 03/15/21  ENDO - ICU hypoglycemic\Hyperglycemia protocol -check  FSBS per protocol   GI GI PROPHYLAXIS as indicated  NUTRITIONAL STATUS DIET-->TF's as tolerated Constipation protocol as indicated   ELECTROLYTES -follow labs as needed -replace as needed -pharmacy consultation and following     Best practice (right click and "Reselect all SmartList Selections" daily)  Diet: NPO Pain/Anxiety/Delirium protocol (if indicated): Yes (RASS goal -1) VAP protocol (if indicated): Yes DVT prophylaxis: SCD GI prophylaxis: PPI Glucose control: SSI Yes Central venous access: Yes, and it is still needed Arterial line: N/A Foley: N/A Mobility: bed rest PT consulted: N/A Code Status:DNR     Labs   CBC: Recent Labs  Lab 03/10/21 0559 03/11/21 0357 03/12/21 0431 03/13/21 0442 03/14/21 0420  WBC 21.6* 18.7* 12.7* 17.7* 12.8*  NEUTROABS  --   --   --  16.2* 10.6*  HGB 9.6* 9.7* 8.6* 9.3* 8.9*  HCT 30.5* 30.9* 26.7* 28.1* 28.5*  MCV 93.8 94.8 95.4 93.4 96.9  PLT 217 196 180 225 580    Basic Metabolic Panel: Recent Labs  Lab 03/10/21 0559 03/10/21 2045 03/11/21 0357 03/12/21 0431 03/13/21 0442 03/14/21 0420  NA 145  --  144 145 143 146*  K 3.5 3.8 4.6 4.2 3.1* 3.5  CL 109  --  111 116* 112* 117*  CO2 30  --  $R'27 25 24 24  'QU$ GLUCOSE 109*  --  182* 262* 196* 118*  BUN 43*  --  41* 48* 49* 46*  CREATININE 0.90  --  0.87 0.99 0.92 0.64  CALCIUM 7.7*  --  7.4* 7.9* 8.0* 7.9*  MG 2.0  --  1.7 1.8 1.9 2.2  PHOS 2.5  --  2.1* 2.6 2.6 2.5   GFR: Estimated Creatinine Clearance: 61.7 mL/min (by C-G formula based on SCr of 0.64 mg/dL). Recent Labs  Lab 03/11/21 0357 03/12/21 0431 03/13/21 0442 03/14/21  0420  WBC 18.7* 12.7* 17.7* 12.8*    Liver Function Tests: Recent Labs  Lab 03/07/21 1850 03/12/21 0431 03/13/21 0442 03/14/21 0420  AST 29 62* 104* 309*  ALT 15 32 49* 163*  ALKPHOS 35* 70 63 85  BILITOT 1.3* 0.7 0.7 0.7  PROT 4.7* 5.5* 5.7* 5.6*  ALBUMIN 3.1* 2.3* 2.4* 2.3*   No results for input(s): LIPASE,  AMYLASE in the last 168 hours. No results for input(s): AMMONIA in the last 168 hours.  ABG    Component Value Date/Time   PHART 7.35 03/06/2021 1811   PCO2ART 46 03/06/2021 1811   PO2ART 100 03/06/2021 1811   HCO3 25.4 03/06/2021 1811   ACIDBASEDEF 0.4 03/06/2021 1811   O2SAT 97.4 03/06/2021 1811     Coagulation Profile: No results for input(s): INR, PROTIME in the last 168 hours.  Cardiac Enzymes: No results for input(s): CKTOTAL, CKMB, CKMBINDEX, TROPONINI in the last 168 hours.  HbA1C: No results found for: HGBA1C  CBG: Recent Labs  Lab 03/13/21 1631 03/13/21 1934 03/13/21 2349 03/14/21 0358 03/14/21 0734  GLUCAP 114* 132* 164* 116* 111*    Allergies Allergies  Allergen Reactions  . Baclofen     Other reaction(s): Headache  . Sertraline     Other reaction(s): Other (See Comments), Other (See Comments) Muscle aches Muscle aches   . Sulfa Antibiotics     Other reaction(s): Other (See Comments), Other (See Comments) Pt was told not to take it due to colitis Pt was told not to take it due to colitis   . Ibuprofen Nausea And Vomiting  . Trazodone And Nefazodone        DVT/GI PRX  assessed I Assessed the need for Labs I Assessed the need for Foley I Assessed the need for Central Venous Line Family Discussion when available I Assessed the need for Mobilization I made an Assessment of medications to be adjusted accordingly Safety Risk assessment completed  [place] ICU TEAM Looking for LTAC bed     Overall, patient is critically ill, prognosis is guarded.  Patient with Multiorgan failure and at high risk for cardiac arrest and death.   DNR status, poor prognosis

## 2021-03-14 NOTE — Progress Notes (Signed)
[facility] Hospital Day(s): 21.   Post op day(s): 5 Days Post-Op.   Interval History:  Patient seen and examined; more alert and participatory this morning No acute events or new complaints overnight.  Patient does shake her head yes/no for simple questions She does endorse mild abdominal discomfort She is asking to drink water Leukocytosis is improving; down to 12.8K; no fevers x24 hours Maintaining renal function, sCr - 0.64, UO - 3.6L Previous hypokalemia resolved Mild hypernatremia to 146 Continues on Meropenem  On tube feedings; goal rate Having colostomy function Was able to tolerate tracheostomy collar for a few hours yesterday afternoon  Vital signs in last 24 hours: [min-max] current  Temp:  [98.3 F (36.8 C)-99 F (37.2 C)] 98.9 F (37.2 C) (04/06 0400) Pulse Rate:  [77-115] 89 (04/06 0600) Resp:  [14-29] 17 (04/06 0600) BP: (107-147)/(54-89) 126/89 (04/06 0600) SpO2:  [100 %] 100 % (04/06 0600) FiO2 (%):  [30 %] 30 % (04/06 0357)     Height: 5' 4.02" (162.6 cm) Weight: 67.2 kg BMI (Calculated): 25.42   Intake/Output last 2 shifts:  04/05 0701 - 04/06 0700 In: 3728.8 [I.V.:41.9; NG/GT:3075; IV Piggyback:611.9] Out: 3700 [Urine:3600; Stool:100]   Physical Exam:  Constitutional: more alert, participatory, NAD Respiratory: s/p tracheostomy Cardiovascular: tachycardic and sinus rhythm  Gastrointestinal: soft, non-tender, and non-distended. Colostomy in the left mid-abdomen, pink/patent, stool in bac Integumentary: Midline wound healing via secondary intention; no erythema  Labs:  CBC Latest Ref Rng & Units 03/14/2021 03/13/2021 03/12/2021  WBC 4.0 - 10.5 K/uL 12.8(H) 17.7(H) 12.7(H)  Hemoglobin 12.0 - 15.0 g/dL 8.9(L) 9.3(L) 8.6(L)  Hematocrit 36.0 - 46.0 % 28.5(L) 28.1(L) 26.7(L)  Platelets 150 - 400 K/uL 213 225 180   CMP Latest Ref Rng & Units 03/14/2021 03/13/2021 03/12/2021  Glucose 70 - 99 mg/dL 118(H) 196(H) 262(H)  BUN  8 - 23 mg/dL 46(H) 49(H) 48(H)  Creatinine 0.44 - 1.00 mg/dL 0.64 0.92 0.99  Sodium 135 - 145 mmol/L 146(H) 143 145  Potassium 3.5 - 5.1 mmol/L 3.5 3.1(L) 4.2  Chloride 98 - 111 mmol/L 117(H) 112(H) 116(H)  CO2 22 - 32 mmol/L $RemoveB'24 24 25  'HAglIVIO$ Calcium 8.9 - 10.3 mg/dL 7.9(L) 8.0(L) 7.9(L)  Total Protein 6.5 - 8.1 g/dL 5.6(L) 5.7(L) 5.5(L)  Total Bilirubin 0.3 - 1.2 mg/dL 0.7 0.7 0.7  Alkaline Phos 38 - 126 U/L 85 63 70  AST 15 - 41 U/L 309(H) 104(H) 62(H)  ALT 0 - 44 U/L 163(H) 49(H) 32    Imaging studies:No new pertinent imaging studies   Assessment/Plan:  70 y.o. female 12 Days Post-Op s/p Hartman's Procedurefor complicated diverticulitis with gangrenous changes and perforation with feculent peritonitis and 4 days s/p colostomy revision    - Okay to continue goal rate tube feedings - Appreciate PCCM assistance; vasopressor and ventilator support - Continue Abx (Meropenem); ID on board; plan to DC ABx on 04/07 - Monitor abdominal examination - Monitor colostomy output; WOC RN following - Monitor leukocytosis - Maintain foley catheter; monitor UO - Midline wound care: pack with iodoform gauze or saline moistened gauze BID, cover with ABD pad and secure. ? Amenability to wound vac placement given location of colostomy   - Disposition plan for LTACH at this time  All of the above findings and recommendations were discussed with the medical team.   -- Edison Simon, PA-C [facility] 03/14/2021, 7:23 AM [phone] M-F: 7am - 4pm

## 2021-03-15 DIAGNOSIS — K5792 Diverticulitis of intestine, part unspecified, without perforation or abscess without bleeding: Secondary | ICD-10-CM | POA: Diagnosis not present

## 2021-03-15 DIAGNOSIS — J9601 Acute respiratory failure with hypoxia: Secondary | ICD-10-CM | POA: Diagnosis not present

## 2021-03-15 DIAGNOSIS — K651 Peritoneal abscess: Secondary | ICD-10-CM | POA: Diagnosis not present

## 2021-03-15 LAB — BLOOD GAS, ARTERIAL
Acid-base deficit: 14.3 mmol/L — ABNORMAL HIGH (ref 0.0–2.0)
Bicarbonate: 14 mmol/L — ABNORMAL LOW (ref 20.0–28.0)
O2 Saturation: 35.5 %
PEEP: 5 cmH2O
Patient temperature: 37
RATE: 12 resp/min
pCO2 arterial: 43 mmHg (ref 32.0–48.0)
pH, Arterial: 7.12 — CL (ref 7.350–7.450)
pO2, Arterial: <31 mmHg — CL (ref 83.0–108.0)

## 2021-03-15 LAB — COMPREHENSIVE METABOLIC PANEL
ALT: 129 U/L — ABNORMAL HIGH (ref 0–44)
AST: 170 U/L — ABNORMAL HIGH (ref 15–41)
Albumin: 2.4 g/dL — ABNORMAL LOW (ref 3.5–5.0)
Alkaline Phosphatase: 89 U/L (ref 38–126)
Anion gap: 7 (ref 5–15)
BUN: 42 mg/dL — ABNORMAL HIGH (ref 8–23)
CO2: 23 mmol/L (ref 22–32)
Calcium: 8 mg/dL — ABNORMAL LOW (ref 8.9–10.3)
Chloride: 118 mmol/L — ABNORMAL HIGH (ref 98–111)
Creatinine, Ser: 0.77 mg/dL (ref 0.44–1.00)
GFR, Estimated: >60 mL/min (ref >60)
Glucose, Bld: 135 mg/dL — ABNORMAL HIGH (ref 70–99)
Potassium: 4.1 mmol/L (ref 3.5–5.1)
Sodium: 148 mmol/L — ABNORMAL HIGH (ref 135–145)
Total Bilirubin: 0.8 mg/dL (ref 0.3–1.2)
Total Protein: 5.7 g/dL — ABNORMAL LOW (ref 6.5–8.1)

## 2021-03-15 LAB — CBC WITH DIFFERENTIAL/PLATELET
Abs Immature Granulocytes: 0.06 10*3/uL (ref 0.00–0.07)
Basophils Absolute: 0 10*3/uL (ref 0.0–0.1)
Basophils Relative: 0 %
Eosinophils Absolute: 0.1 10*3/uL (ref 0.0–0.5)
Eosinophils Relative: 1 %
HCT: 29.8 % — ABNORMAL LOW (ref 36.0–46.0)
Hemoglobin: 9.6 g/dL — ABNORMAL LOW (ref 12.0–15.0)
Immature Granulocytes: 1 %
Lymphocytes Relative: 9 %
Lymphs Abs: 0.9 10*3/uL (ref 0.7–4.0)
MCH: 31.3 pg (ref 26.0–34.0)
MCHC: 32.2 g/dL (ref 30.0–36.0)
MCV: 97.1 fL (ref 80.0–100.0)
Monocytes Absolute: 0.8 10*3/uL (ref 0.1–1.0)
Monocytes Relative: 8 %
Neutro Abs: 8.2 10*3/uL — ABNORMAL HIGH (ref 1.7–7.7)
Neutrophils Relative %: 81 %
Platelets: 215 10*3/uL (ref 150–400)
RBC: 3.07 MIL/uL — ABNORMAL LOW (ref 3.87–5.11)
RDW: 18 % — ABNORMAL HIGH (ref 11.5–15.5)
WBC: 10 10*3/uL (ref 4.0–10.5)
nRBC: 0 % (ref 0.0–0.2)

## 2021-03-15 LAB — MAGNESIUM: Magnesium: 1.8 mg/dL (ref 1.7–2.4)

## 2021-03-15 LAB — GLUCOSE, CAPILLARY
Glucose-Capillary: 101 mg/dL — ABNORMAL HIGH (ref 70–99)
Glucose-Capillary: 119 mg/dL — ABNORMAL HIGH (ref 70–99)
Glucose-Capillary: 125 mg/dL — ABNORMAL HIGH (ref 70–99)
Glucose-Capillary: 142 mg/dL — ABNORMAL HIGH (ref 70–99)
Glucose-Capillary: 206 mg/dL — ABNORMAL HIGH (ref 70–99)
Glucose-Capillary: 72 mg/dL (ref 70–99)

## 2021-03-15 LAB — PHOSPHORUS: Phosphorus: 2.7 mg/dL (ref 2.5–4.6)

## 2021-03-15 MED ORDER — MAGNESIUM SULFATE 2 GM/50ML IV SOLN
2.0000 g | Freq: Once | INTRAVENOUS | Status: AC
  Administered 2021-03-15: 2 g via INTRAVENOUS
  Filled 2021-03-15: qty 50

## 2021-03-15 MED ORDER — FREE WATER
200.0000 mL | Status: DC
  Administered 2021-03-15 – 2021-03-19 (×22): 200 mL

## 2021-03-15 NOTE — Progress Notes (Signed)
[facility] Hospital Day(s): 22.   Post op day(s): 6 Days Post-Op.   Interval History:  Patient seen and examined; more alert and participatory this morning No acute events or new complaints overnight.  Patient does shake her head yes/no for simple questions She sees more alert and happy this morning; smiling Leukocytosis resolved; now 10.0K Maintaining renal function, sCr - 0.77, UO - 4.3L Continues on Meropenem; planned stop date 4/7 On tube feedings; goal rate Having colostomy function  Vital signs in last 24 hours: [min-max] current  Temp:  [97.8 F (36.6 C)-98.7 F (37.1 C)] 98.2 F (36.8 C) (04/07 0408) Pulse Rate:  [71-110] 89 (04/07 0600) Resp:  [13-36] 16 (04/07 0600) BP: (92-148)/(55-98) 104/67 (04/07 0600) SpO2:  [100 %] 100 % (04/07 0600) FiO2 (%):  [28 %] 28 % (04/07 0356)     Height: 5' 4.02" (162.6 cm) Weight: 67.2 kg BMI (Calculated): 25.42   Intake/Output last 2 shifts:  04/06 0701 - 04/07 0700 In: 1221.5 [I.V.:25; NG/GT:770; IV Piggyback:426.5] Out: 4765 [Urine:4370; Stool:125]   Physical Exam:  Constitutional: more alert, participatory, NAD Respiratory: s/p tracheostomy Cardiovascular: tachycardic and sinus rhythm  Gastrointestinal: soft, non-tender, and non-distended. Colostomy in the left mid-abdomen, pink/patent, stool in bac Integumentary: Midline wound healing via secondary intention; no erythema  Labs:  CBC Latest Ref Rng & Units 03/15/2021 03/14/2021 03/13/2021  WBC 4.0 - 10.5 K/uL 10.0 12.8(H) 17.7(H)  Hemoglobin 12.0 - 15.0 g/dL 9.6(L) 8.9(L) 9.3(L)  Hematocrit 36.0 - 46.0 % 29.8(L) 28.5(L) 28.1(L)  Platelets 150 - 400 K/uL 215 213 225   CMP Latest Ref Rng & Units 03/15/2021 03/14/2021 03/13/2021  Glucose 70 - 99 mg/dL 135(H) 118(H) 196(H)  BUN 8 - 23 mg/dL 42(H) 46(H) 49(H)  Creatinine 0.44 - 1.00 mg/dL 0.77 0.64 0.92  Sodium 135 - 145 mmol/L 148(H) 146(H) 143  Potassium 3.5 - 5.1 mmol/L 4.1 3.5 3.1(L)   Chloride 98 - 111 mmol/L 118(H) 117(H) 112(H)  CO2 22 - 32 mmol/L $RemoveB'23 24 24  'HSYtjSFv$ Calcium 8.9 - 10.3 mg/dL 8.0(L) 7.9(L) 8.0(L)  Total Protein 6.5 - 8.1 g/dL 5.7(L) 5.6(L) 5.7(L)  Total Bilirubin 0.3 - 1.2 mg/dL 0.8 0.7 0.7  Alkaline Phos 38 - 126 U/L 89 85 63  AST 15 - 41 U/L 170(H) 309(H) 104(H)  ALT 0 - 44 U/L 129(H) 163(H) 49(H)    Imaging studies:No new pertinent imaging studies   Assessment/Plan:  70 y.o. female 12 Days Post-Op s/p Hartman's Procedurefor complicated diverticulitis with gangrenous changes and perforation with feculent peritonitis and 4 days s/p colostomy revision    - Okay to continue goal rate tube feedings - Appreciate PCCM assistance; vasopressor and ventilator support - Continue Abx (Meropenem); ID on board; plan to DC ABx on 04/07 - Monitor abdominal examination - Monitor colostomy output; WOC RN following - Monitor leukocytosis - Maintain foley catheter; monitor UO - Midline wound care: pack with iodoform gauze or saline moistened gauze BID, cover with ABD pad and secure. ? Amenability to wound vac placement given location of colostomy, will discuss with WOC  - Disposition plan for LTACH at this time  All of the above findings and recommendations were discussed with the medical team.   -- Edison Simon, PA-C [facility] 03/15/2021, 8:41 AM [phone] M-F: 7am - 4pm

## 2021-03-15 NOTE — Consult Note (Signed)
[place] Nurse wound follow up Discussed plan of care via secure chat with surgical PA.  We will plan to meet tomorrow at 08:30 and assess abd wound and apply Vac dressing to full thickness post-op wound. Notified bedside nurse so patient will be premedicated and Vac machine ordered. Julien Girt MSN, RN, [place], [place], [place]

## 2021-03-15 NOTE — Progress Notes (Signed)
[facility] for Electrolyte Monitoring and Replacement   Recent Labs: Potassium (mmol/L)  Date Value  03/15/2021 4.1   Magnesium (mg/dL)  Date Value  03/15/2021 1.8   Calcium (mg/dL)  Date Value  03/15/2021 8.0 (L)   Albumin (g/dL)  Date Value  03/15/2021 2.4 (L)   Phosphorus (mg/dL)  Date Value  03/15/2021 2.7   Sodium (mmol/L)  Date Value  03/15/2021 148 (H)   Corrected Ca: 9.3  Assessment: Desiree Madden is a 70 y.o. female admitted on 02/21/2021 with intra-abdominal infection (diverticulitis w/ fecal perforation). Patient septic with acute diverticulitis abdominal abscess with colovesical fistula and enterococcus urinary tract infection which was further complicated by the development of complicated diverticulitis with perforation and gangrenous changes requiring Hartman's procedure performed on 03/01/21. Pt developed severe septic shock after procedure requiring vasopressors. Additionally, pt has metabolic acidosis. PMH includes CAD s/p CABG (2015), HTN, hypothyroidism, colon bladder fistula, diverticulitis, and ulcerative colitis. Pharmacy has been consulted for electrolyte monitoring.  Patient was extubated and re-intubated 3/29, tracheostomy placed 3/31  Nutrition: Continous tube feeds @ 55 mL/hr and Prosource daily   Goal of Therapy:  K 4.0-5.1 Mg 2.0-2.4 Electrolytes WNL  Plan:  -- Mg 1.8 - NP ordered Mg 2g IV x 1 dose  -- Na 146>148 - Increase free water 100 mL per tube q4h to 200 mL q4h -- Other electrolytes WNL (no further replenishment warranted) -- F/u electrolytes with AM labs  [name], PharmD Pharmacy Resident  03/15/2021 7:24 AM

## 2021-03-15 NOTE — TOC Progression Note (Signed)
Transition of Care [facility]) - Progression Note    Patient Details  Name: Desiree Madden MRN: [id number] Date of Birth: [birth year]  [facility] CM/SW Contact  Shelbie Hutching, RN Phone Number: 03/15/2021, 2:30 PM  Clinical Narrative:    [facility] started insurance authorization for patient yesterday.  [facility] representative updated TOC today that authorization has been denied by the insurance.  RNCM notified MD that he can complete a peer to peer by calling [phone] ext [phone].         Expected Discharge Plan and Services                                                 Social Determinants of Health (SDOH) Interventions    Readmission Risk Interventions No flowsheet data found.

## 2021-03-15 NOTE — Progress Notes (Signed)
NAME:  Desiree Madden, MRN:  [id number], DOB:  [birth year], LOS: 69 ADMISSION DATE:  02/21/2021   BRIEF SYNOPSIS  Brief Pt Description:  71 y.o. Female admitted 02/21/2021 due to Diverticulitis with Colovesical fistula and UTI. On 03/01/21 she developed complicated Diverticulitis with perforation requiring Sigmoid colectomy with end colostomy and Hartman's procedure. Post procedure she returns to ICU and remains intubated with Septic Shock requiring multiple vasopressors.    Significant Hospital Events: Including procedures, antibiotic start and stop dates in addition to other pertinent events    02/21/2021:Admitted to Med/Surg unit with Diverticulitis and UTI  02/28/2021:LLQ abdominal drain placed by IR to pelvic abscess  03/01/2021:Worsening Leukocytosis despite drain placement. Found to have complicated Diverticulitis with perforation requiring Sigmoid Colectomy with end colostomy and Hartman's Procedure. Post procedure she returns to ICU and remains intubated with Septic Shock requiring multiple vasopressors. Left Femoral CVC and Arterial lines placed emergently  03/04/2021:Severe anasarca, albumin 1.8 DX of NSTEMI  3/28 failure to wean from vent  3/29 severe agitation, septic shock on pressors  3/29 failed extubation, re intubated daughter updated  3/30 ENT consulted  3/31 [facility] placed, daughter updated, made DNR status  4/1 colostomy revision, remains on vent, daughter updated  4/2 failed weaning trials   4/4 patient is on 5/5 back to put her on SAT SBT still minimally responsive and now looking for an LTAC bed.  4/5 surgical clips removed patient fascia is open but muscles are closed needs twice a day wet-to-dry packing ID saw patient yesterday and this is the recommendations Vanco cefepime and Flagyl changed to meropenem and Fluconazole on 03/06/21-day 6   as leucocytosis improved a lot may be able to DC antibiotics on day 10-- 03/15/21  4/6 no major events  overnight patient had beta-blocker restarted and here episodes of V. tach clearance has decreased.  4/7 failure to wean from vent due to muscle fatigue and ischemic cardiomyopathy    Cultures:  02/21/21: SARS CoV-2 PCR>>negative 02/21/21: Influenza PCR>>negative 02/21/2021: Urine>>ENTEROCOCCUS FAECALIS, ENTEROCOCCUS FAECIUM(sensitive to vancomycin and ampicillin) 02/22/2021: HIV screen>> nonreactive 02/28/2021: LLQ Abdominal drain>>Enterococcusavium, other polymicrobial Candida albicans 03/01/2021: MRSA PCR>> negative 03/01/2021: Blood culture x2>>  Antimicrobials:  Ciprofloxacin 3/16>>3/18 Unasyn 3/23>>3/23 Flagyl 3/16>>3/18; 3/24>>3/29 Cefepime 3/24>>3/29 Vancomycin 3/24>>3/29 UNASYN 3/29-->    Interim History / Subjective:  Remains on vent S/p trach revised colostomy Failure to wean from vent Open fascia midline incision     Objective   Blood pressure 104/67, pulse 89, temperature 98.2 F (36.8 C), resp. rate 16, height 5' 4.02" (1.626 m), weight 67.2 kg, SpO2 100 %.    Vent Madden: PRVC FiO2 (%):  [28 %] 28 % Set Rate:  [15 bmp] 15 bmp Vt Set:  [450 mL] 450 mL PEEP:  [5 cmH20] 5 cmH20   Intake/Output Summary (Last 24 hours) at 03/15/2021 0745 Last data filed at 03/15/2021 0615 Gross per 24 hour  Intake 1221.46 ml  Output 4495 ml  Net -3273.54 ml   Filed Weights   02/27/21 0900 02/28/21 1014 03/01/21 1511  Weight: 66.9 kg 67.2 kg 67.2 kg      REVIEW OF SYSTEMS  PATIENT IS UNABLE TO PROVIDE COMPLETE REVIEW OF SYSTEMS DUE TO CRITICAL ILLNESS, lethargy   PHYSICAL EXAMINATION:  GENERAL:critically ill appearing,  HEAD: Normocephalic, atraumatic.  EYES: Pupils equal, round, reactive to light.  No scleral icterus.  MOUTH: Moist mucosal membrane. NECK: Supple. S/p trach PULMONARY: +rhonchi,  CARDIOVASCULAR: S1 and S2. Regular rate and rhythm. No murmurs, rubs, or gallops.  GASTROINTESTINAL:  colostomy +distention  MUSCULOSKELETAL: +edema.   NEUROLOGIC: lethargic SKIN:intact,warm,dry   Labs/imaging that I havepersonally reviewed  (right click and "Reselect all SmartList Selections" daily)    Resolved Hospital Problem list   NA    ASSESSMENT AND PLAN SYNOPSIS 70 yo with acute and severe septic shock with hemorrhagic shock with acute hypoxic resp failure with septic shock and NSTEMI due to complicated Complicated Diverticulitis/Pelvic Abscess with Perforation &Gangrene of the Sigmoid Colon, Colovesical Fistula failed trial of extubation and re-intubated 3/29 Trach on 3/31 Patient with open wound fascia surgical clips removed now with dressing twice a day wet-to-dry     Severe ACUTE Hypoxic and Hypercapnic Respiratory Failure -continue Mechanical Ventilator support -continue Bronchodilator Therapy -Wean Fio2 and PEEP as tolerated -VAP/VENT bundle implementation Plan for weaning trials    CARDIAC FAILURE-acute systolic dysfunction NSTEMI ISCHEMIC CARDIOMYOPATHY -oxygen as needed -Lasix as tolerated -follow up cardiac enzymes as indicated (Concern for basal to mid inferior wall  hypokinesis).  Will need to discuss with cardiology  CARDIAC ICU monitoring   ACUTE KIDNEY INJURY/Renal Failure -continue Foley Catheter-assess need -Avoid nephrotoxic agents -Follow urine output, BMP -Ensure adequate renal perfusion, optimize oxygenation -Renal dose medications   NEUROLOGY Acute toxic metabolic encephalopathy, need for sedation precedex as needed More alert and awake   SEPTIC SHOCK -use vasopressors to keep MAP>65 as needed  INFECTIOUS DISEASE -continue antibiotics as prescribed -follow up cultures -follow up ID consultation +CANDIDA ALBICANS +ENTEROCOCCUS AVIUM +CLOSTRIDIUM +BACTEROIDES Vanco cefepime and Flagyl changed to meropenem and Fluconazole on 03/06/21-day 6  as leucocytosis improved a lot may be able to DC antibiotics on day 10-- 03/15/21   ENDO - ICU hypoglycemic\Hyperglycemia  protocol -check FSBS per protocol   GI GI PROPHYLAXIS as indicated  NUTRITIONAL STATUS DIET-->TF's as tolerated Constipation protocol as indicated   ELECTROLYTES -follow labs as needed -replace as needed -pharmacy consultation and following      Best practice (right click and "Reselect all SmartList Selections" daily)  Diet: NPO Pain/Anxiety/Delirium protocol (if indicated): Yes (RASS goal -1) VAP protocol (if indicated): Yes DVT prophylaxis: SCD GI prophylaxis: PPI Glucose control: SSI Yes Central venous access: Yes, and it is still needed Arterial line: N/A Foley: N/A Mobility: bed rest PT consulted: N/A Code Status:DNR     Labs   CBC: Recent Labs  Lab 03/11/21 0357 03/12/21 0431 03/13/21 0442 03/14/21 0420 03/15/21 0446  WBC 18.7* 12.7* 17.7* 12.8* 10.0  NEUTROABS  --   --  16.2* 10.6* 8.2*  HGB 9.7* 8.6* 9.3* 8.9* 9.6*  HCT 30.9* 26.7* 28.1* 28.5* 29.8*  MCV 94.8 95.4 93.4 96.9 97.1  PLT 196 180 225 213 856    Basic Metabolic Panel: Recent Labs  Lab 03/11/21 0357 03/12/21 0431 03/13/21 0442 03/14/21 0420 03/15/21 0446  NA 144 145 143 146* 148*  K 4.6 4.2 3.1* 3.5 4.1  CL 111 116* 112* 117* 118*  CO2 $Re'27 25 24 24 23  'ChB$ GLUCOSE 182* 262* 196* 118* 135*  BUN 41* 48* 49* 46* 42*  CREATININE 0.87 0.99 0.92 0.64 0.77  CALCIUM 7.4* 7.9* 8.0* 7.9* 8.0*  MG 1.7 1.8 1.9 2.2 1.8  PHOS 2.1* 2.6 2.6 2.5 2.7   GFR: Estimated Creatinine Clearance: 61.7 mL/min (by C-G formula based on SCr of 0.77 mg/dL). Recent Labs  Lab 03/12/21 0431 03/13/21 0442 03/14/21 0420 03/15/21 0446  WBC 12.7* 17.7* 12.8* 10.0    Liver Function Tests: Recent Labs  Lab 03/12/21 0431 03/13/21 0442 03/14/21 0420 03/15/21 0446  AST 62* 104*  309* 170*  ALT 32 49* 163* 129*  ALKPHOS 70 63 85 89  BILITOT 0.7 0.7 0.7 0.8  PROT 5.5* 5.7* 5.6* 5.7*  ALBUMIN 2.3* 2.4* 2.3* 2.4*   No results for input(s): LIPASE, AMYLASE in the last 168 hours. No results  for input(s): AMMONIA in the last 168 hours.  ABG    Component Value Date/Time   PHART 7.35 03/06/2021 1811   PCO2ART 46 03/06/2021 1811   PO2ART 100 03/06/2021 1811   HCO3 25.4 03/06/2021 1811   ACIDBASEDEF 0.4 03/06/2021 1811   O2SAT 97.4 03/06/2021 1811     Coagulation Profile: No results for input(s): INR, PROTIME in the last 168 hours.  Cardiac Enzymes: No results for input(s): CKTOTAL, CKMB, CKMBINDEX, TROPONINI in the last 168 hours.  HbA1C: No results found for: HGBA1C  CBG: Recent Labs  Lab 03/14/21 1137 03/14/21 1745 03/14/21 1920 03/14/21 2336 03/15/21 0400  GLUCAP 97 118* 119* 227* 142*    Allergies Allergies  Allergen Reactions  . Baclofen     Other reaction(s): Headache  . Sertraline     Other reaction(s): Other (See Comments), Other (See Comments) Muscle aches Muscle aches   . Sulfa Antibiotics     Other reaction(s): Other (See Comments), Other (See Comments) Pt was told not to take it due to colitis Pt was told not to take it due to colitis   . Ibuprofen Nausea And Vomiting  . Trazodone And Nefazodone        DVT/GI PRX  assessed I Assessed the need for Labs I Assessed the need for Foley I Assessed the need for Central Venous Line Family Discussion when available I Assessed the need for Mobilization I made an Assessment of medications to be adjusted accordingly Safety Risk assessment completed  CASE DISCUSSED IN MULTIDISCIPLINARY ROUNDS WITH ICU TEAM     Critical Care Time devoted to patient care services described in this note is 45 minutes.   Overall, patient is critically ill, prognosis is guarded.    DNR status, poor prognosis   Kirstyn Lean Patricia Pesa, M.D.  [facility]  Medical Director [place] Director [place] Department

## 2021-03-16 ENCOUNTER — Inpatient Hospital Stay
Admit: 2021-03-16 | Discharge: 2021-03-16 | Disposition: A | Discharge status: Home / Self Care | Payer: Medicare HMO | Attending: Internal Medicine | Admitting: Internal Medicine

## 2021-03-16 DIAGNOSIS — E43 Unspecified severe protein-calorie malnutrition: Secondary | ICD-10-CM | POA: Diagnosis not present

## 2021-03-16 DIAGNOSIS — J9601 Acute respiratory failure with hypoxia: Secondary | ICD-10-CM | POA: Diagnosis not present

## 2021-03-16 DIAGNOSIS — R1010 Upper abdominal pain, unspecified: Secondary | ICD-10-CM

## 2021-03-16 DIAGNOSIS — K5792 Diverticulitis of intestine, part unspecified, without perforation or abscess without bleeding: Secondary | ICD-10-CM | POA: Diagnosis not present

## 2021-03-16 LAB — PHOSPHORUS: Phosphorus: 2.9 mg/dL (ref 2.5–4.6)

## 2021-03-16 LAB — GLUCOSE, CAPILLARY
Glucose-Capillary: 105 mg/dL — ABNORMAL HIGH (ref 70–99)
Glucose-Capillary: 109 mg/dL — ABNORMAL HIGH (ref 70–99)
Glucose-Capillary: 123 mg/dL — ABNORMAL HIGH (ref 70–99)
Glucose-Capillary: 135 mg/dL — ABNORMAL HIGH (ref 70–99)
Glucose-Capillary: 137 mg/dL — ABNORMAL HIGH (ref 70–99)
Glucose-Capillary: 146 mg/dL — ABNORMAL HIGH (ref 70–99)
Glucose-Capillary: 148 mg/dL — ABNORMAL HIGH (ref 70–99)

## 2021-03-16 LAB — CBC WITH DIFFERENTIAL/PLATELET
Abs Immature Granulocytes: 0.05 10*3/uL (ref 0.00–0.07)
Basophils Absolute: 0 10*3/uL (ref 0.0–0.1)
Basophils Relative: 0 %
Eosinophils Absolute: 0.4 10*3/uL (ref 0.0–0.5)
Eosinophils Relative: 4 %
HCT: 31.4 % — ABNORMAL LOW (ref 36.0–46.0)
Hemoglobin: 10.1 g/dL — ABNORMAL LOW (ref 12.0–15.0)
Immature Granulocytes: 1 %
Lymphocytes Relative: 13 %
Lymphs Abs: 1.3 10*3/uL (ref 0.7–4.0)
MCH: 30.6 pg (ref 26.0–34.0)
MCHC: 32.2 g/dL (ref 30.0–36.0)
MCV: 95.2 fL (ref 80.0–100.0)
Monocytes Absolute: 0.7 10*3/uL (ref 0.1–1.0)
Monocytes Relative: 7 %
Neutro Abs: 7 10*3/uL (ref 1.7–7.7)
Neutrophils Relative %: 75 %
Platelets: 221 10*3/uL (ref 150–400)
RBC: 3.3 MIL/uL — ABNORMAL LOW (ref 3.87–5.11)
RDW: 17.8 % — ABNORMAL HIGH (ref 11.5–15.5)
WBC: 9.4 10*3/uL (ref 4.0–10.5)
nRBC: 0 % (ref 0.0–0.2)

## 2021-03-16 LAB — COMPREHENSIVE METABOLIC PANEL
ALT: 94 U/L — ABNORMAL HIGH (ref 0–44)
AST: 112 U/L — ABNORMAL HIGH (ref 15–41)
Albumin: 2.4 g/dL — ABNORMAL LOW (ref 3.5–5.0)
Alkaline Phosphatase: 86 U/L (ref 38–126)
Anion gap: 9 (ref 5–15)
BUN: 39 mg/dL — ABNORMAL HIGH (ref 8–23)
CO2: 24 mmol/L (ref 22–32)
Calcium: 8 mg/dL — ABNORMAL LOW (ref 8.9–10.3)
Chloride: 115 mmol/L — ABNORMAL HIGH (ref 98–111)
Creatinine, Ser: 0.67 mg/dL (ref 0.44–1.00)
GFR, Estimated: >60 mL/min (ref >60)
Glucose, Bld: 153 mg/dL — ABNORMAL HIGH (ref 70–99)
Potassium: 3.2 mmol/L — ABNORMAL LOW (ref 3.5–5.1)
Sodium: 148 mmol/L — ABNORMAL HIGH (ref 135–145)
Total Bilirubin: 0.5 mg/dL (ref 0.3–1.2)
Total Protein: 5.7 g/dL — ABNORMAL LOW (ref 6.5–8.1)

## 2021-03-16 LAB — MAGNESIUM: Magnesium: 1.8 mg/dL (ref 1.7–2.4)

## 2021-03-16 MED ORDER — MAGNESIUM SULFATE 2 GM/50ML IV SOLN
2.0000 g | Freq: Once | INTRAVENOUS | Status: AC
  Administered 2021-03-16: 2 g via INTRAVENOUS
  Filled 2021-03-16: qty 50

## 2021-03-16 MED ORDER — AMIODARONE HCL 200 MG PO TABS
200.0000 mg | ORAL_TABLET | Freq: Two times a day (BID) | ORAL | Status: DC
  Administered 2021-03-16 – 2021-03-23 (×15): 200 mg
  Filled 2021-03-16 (×15): qty 1

## 2021-03-16 MED ORDER — JUVEN PO PACK
1.0000 | PACK | Freq: Two times a day (BID) | ORAL | Status: DC
  Administered 2021-03-16 – 2021-03-23 (×11): 1

## 2021-03-16 MED ORDER — POTASSIUM CHLORIDE 20 MEQ PO PACK
40.0000 meq | PACK | Freq: Two times a day (BID) | ORAL | Status: AC
  Administered 2021-03-16 (×2): 40 meq
  Filled 2021-03-16 (×2): qty 2

## 2021-03-16 MED ORDER — VITAL AF 1.2 CAL PO LIQD
1000.0000 mL | ORAL | Status: DC
  Administered 2021-03-16 – 2021-03-20 (×4): 1000 mL

## 2021-03-16 NOTE — Progress Notes (Signed)
Brief Cardiology note Reportedly had a short run of SVT she has had a long history of atrial fibrillation Resting heart rate now running 100 Would recommend resuming amiodarone to 200 twice a day Preliminary echo suggests mildly reduced left ventricular function EF=45-50% globally Recommend antiarrhythmic therapy with amiodarone to help treat SVT as well as A. Fib We will have cardiology follow-up in a patient for the next few days [place]

## 2021-03-16 NOTE — Progress Notes (Addendum)
[facility] Hospital Day(s): 23.   Post op day(s): 7 Days Post-Op.   Interval History:  Patient seen and examined; more alert and participatory this morning No acute events or new complaints overnight.  Tolerating tracheostomy collar without ventilator No abdominal pain Remains without leukocytosis this morning; down to 9.4K Hgb stable and improving; now 10.1 Maintaining renal function, sCr - 0.67, UO - 3.1L On tube feedings; goal rate Having colostomy function  Vital signs in last 24 hours: [min-max] current  Temp:  [97.3 F (36.3 C)-99.5 F (37.5 C)] 97.8 F (36.6 C) (04/08 0400) Pulse Rate:  [71-111] 71 (04/08 0600) Resp:  [16-26] 21 (04/08 0600) BP: (108-161)/(56-94) 153/77 (04/08 0600) SpO2:  [99 %-100 %] 100 % (04/08 0600) FiO2 (%):  [28 %] 28 % (04/07 2018)     Height: 5' 4.02" (162.6 cm) Weight: 67.2 kg BMI (Calculated): 25.42   Intake/Output last 2 shifts:  04/07 0701 - 04/08 0700 In: 2548.1 [NG/GT:2198.1; IV Piggyback:350] Out: 2703 [Urine:3100; Stool:325]   Physical Exam:  Constitutional: more alert, participatory, NAD Respiratory: s/p tracheostomy Cardiovascular: tachycardic and sinus rhythm  Gastrointestinal: soft, non-tender, and non-distended. Colostomy in the left mid-abdomen, pink/patent, stool in bac Integumentary: Midline wound healing via secondary intention; no erythema  Labs:  CBC Latest Ref Rng & Units 03/16/2021 03/15/2021 03/14/2021  WBC 4.0 - 10.5 K/uL 9.4 10.0 12.8(H)  Hemoglobin 12.0 - 15.0 g/dL 10.1(L) 9.6(L) 8.9(L)  Hematocrit 36.0 - 46.0 % 31.4(L) 29.8(L) 28.5(L)  Platelets 150 - 400 K/uL 221 215 213   CMP Latest Ref Rng & Units 03/16/2021 03/15/2021 03/14/2021  Glucose 70 - 99 mg/dL 153(H) 135(H) 118(H)  BUN 8 - 23 mg/dL 39(H) 42(H) 46(H)  Creatinine 0.44 - 1.00 mg/dL 0.67 0.77 0.64  Sodium 135 - 145 mmol/L 148(H) 148(H) 146(H)  Potassium 3.5 - 5.1 mmol/L 3.2(L) 4.1 3.5  Chloride 98 - 111 mmol/L 115(H)  118(H) 117(H)  CO2 22 - 32 mmol/L $RemoveB'24 23 24  'NXVSwVpO$ Calcium 8.9 - 10.3 mg/dL 8.0(L) 8.0(L) 7.9(L)  Total Protein 6.5 - 8.1 g/dL 5.7(L) 5.7(L) 5.6(L)  Total Bilirubin 0.3 - 1.2 mg/dL 0.5 0.8 0.7  Alkaline Phos 38 - 126 U/L 86 89 85  AST 15 - 41 U/L 112(H) 170(H) 309(H)  ALT 0 - 44 U/L 94(H) 129(H) 163(H)    Imaging studies:No new pertinent imaging studies   Assessment/Plan:  70 y.o. female 12 Days Post-Op s/p Hartman's Procedurefor complicated diverticulitis with gangrenous changes and perforation with feculent peritonitis and 4 days s/p colostomy revision    - Okay to continue goal rate tube feedings; ? Role for PEG placement, may need to discuss with GI as there is not much room for surgical placement - Appreciate PCCM assistance;  - Continue Abx (Meropenem); ID on board - Monitor abdominal examination - Monitor colostomy output; WOC RN following - Monitor leukocytosis; resolved x48 hours - Maintain foley catheter; monitor UO; d/w with urology, recommend leaving x1 month from date of surgery and preforming cystogram prior to removal consideration - Midline wound care: Wound vac placed with WOC RN; MWF schedule as amenable  - Disposition plan for LTACH at this time  All of the above findings and recommendations were discussed with the medical team.   -- Edison Simon, PA-C [facility] 03/16/2021, 7:17 AM [phone] M-F: 7am - 4pm

## 2021-03-16 NOTE — Progress Notes (Signed)
Speculator for Electrolyte Monitoring and Replacement   Recent Labs: Potassium (mmol/L)  Date Value  03/16/2021 3.2 (L)   Magnesium (mg/dL)  Date Value  03/16/2021 1.8   Calcium (mg/dL)  Date Value  03/16/2021 8.0 (L)   Albumin (g/dL)  Date Value  03/16/2021 2.4 (L)   Phosphorus (mg/dL)  Date Value  03/16/2021 2.9   Sodium (mmol/L)  Date Value  03/16/2021 148 (H)   Corrected Ca: 9.3  Assessment: Desiree Madden is a 70 y.o. female admitted on 02/21/2021 with intra-abdominal infection (diverticulitis w/ fecal perforation). Patient septic with acute diverticulitis abdominal abscess with colovesical fistula and enterococcus urinary tract infection which was further complicated by the development of complicated diverticulitis with perforation and gangrenous changes requiring Hartman's procedure performed on 03/01/21. Pt developed severe septic shock after procedure requiring vasopressors. Additionally, pt has metabolic acidosis. PMH includes CAD s/p CABG (2015), HTN, hypothyroidism, colon bladder fistula, diverticulitis, and ulcerative colitis. Pharmacy has been consulted for electrolyte monitoring.  Patient was extubated and re-intubated 3/29, tracheostomy placed 3/31  Nutrition: Continous tube feeds @ 55 mL/hr and Prosource daily   Goal of Therapy:  K 4.0-5.1 Mg 2.0-2.4 Electrolytes WNL  Plan:  -- K 3.2 - ordered 40 mEq per tube x 1 doses -- Mg 1.8 - ordered Mg 2g IV x 1 dose  -- Na 146>148 - Continue free water 200 mL q4h (increased 4/7) -- No additional electrolytes replacement at this time -- F/u electrolytes with AM labs  [name], PharmD Pharmacy Resident  03/16/2021 7:37 AM

## 2021-03-16 NOTE — TOC Progression Note (Signed)
Transition of Care [facility]) - Progression Note    Patient Details  Name: Desiree Madden MRN: [id number] Date of Birth: 04-18-1951  Transition of Care [facility]) CM/SW [place], [place] Phone Number: [phone] 03/16/2021, 3:45 PM  Clinical Narrative:     Attending spoke with insurance MD for peer-to-peer and patient was denied for [facility] placement. CSW spoke with [place] w/ [facility] and she will begin the appeal process on Monday and send paperwork to [place] for Attending signature.  CSW updated patient's Annabelle Harman (Daughter) [phone].  CSW updated Attending.       Expected Discharge Plan and Services                                                 Social Determinants of Health (SDOH) Interventions    Readmission Risk Interventions No flowsheet data found.

## 2021-03-16 NOTE — Consult Note (Addendum)
[place] Nurse wound follow up Surgical team at the bedside to assess abd wound appearance.  Requested to apply Vac dressing. Wound type: Abd with full thickness post-op wound to midline; 15X8X7cm, beefy red with yellow adipose scattered throughout, small amt tan drainage from upper inner wound, no odor Pt was medicated for pain prior to the procedure; she is critically ill and on the ventilator but tolerated with minimal amt apparent discomfort.  Applied barrier ring to creases surrounding wound at 3:00 o'clock and also 5:00 o'clock to 7:00 o'clock, then one piece black for to 169mm cont suction.  Wound is located in close proximity to the ostomy and will be difficult to maintain a seal. WOC team will change dressing Q M/W/F.  WOC Nurse ostomy follow up Stoma type/location: LMQ colostomy Stomal assessment/size: 1 and 5/8 inches round, red, edematous, raised and slightly long (2cm).  There is a crease at 9:00 o'clock to the stoma and another deep crease below stoma; applied barrier ring to attempt to maintain a seal.  Output: mod amt liquid brown stool Ostomy pouching: 2pc. 2 and 3/4 inch pouching system with skin barrier ring Education provided: No family present and patient is on the ventilator. [place] team will begin educational sessions when pt is stable and out of ICU. Enrolled patient in [place] Start Discharge program: Not yet Julien Girt MSN, [place], [place], [place], [place]

## 2021-03-16 NOTE — Progress Notes (Signed)
Nutrition Follow-up  DOCUMENTATION CODES:   Severe malnutrition in context of acute illness/injury  INTERVENTION:   Increase Vital AF 1.2 to 90m/hr   Free water flushes 2053mq4 hours per MD   Regimen provides 1872kcal/day, 117g/day protein and 246522may free water  Juven Fruit Punch BID, each serving provides 95kcal and 2.5g of protein (amino acids glutamine and arginine)  NUTRITION DIAGNOSIS:   Severe Malnutrition related to acute illness as evidenced by moderate fat depletion,moderate muscle depletion,severe muscle depletion. -new diagnoses    GOAL:   Provide needs based on ASPEN/SCCM guidelines  -met with tube feeds   MONITOR:   Vent status,Labs,Weight trends,TF tolerance,Skin,I & O's  ASSESSMENT:   [age]o. female with h/o HTN, CKD III, colovesical fistula, hypothroidism and HLD who is admitted with diverticulitis and constipation.   -Pt s/p sigmoid colectomy with end colostomy, Hartman's procedure, splenic flexure mobilization and fistula repair 3/24 -Pt s/p tracheostomy and NGT 3/31 -Pt s/p ostomy revision and maturation 4/1  Pt tolerating tube feeds at goal rate via NGT. Refeed labs stable. VAC placed on midline wound. RD will increase tube feed rate to provide more calories and protein. RD will also add Juven to support wound healing. Pt on trach collar today; pt off vent for 24 hours. Plan is for LTACH. Discussions are being held about G-tube placement. SLP to evaluate for diet. Per chart, pt is weight stable for the past week.   Medications reviewed and include: colace, lovenox, insulin, synthroid, MVI, protonix, miralax, KCl, meropenem  Labs reviewed: Na 148(H), K 3.2(L), BUN 39(H), P 2.9 wnl, Mg 1.8 wnl Hgb 10.1(L), Hct 31.4(L) cbgs- 123, 105, 148 x 24 hrs  MAP- >46m32m UOP- 3100ml60met Order:    Diet Order            Diet NPO time specified  Diet effective now                EDUCATION NEEDS:   Not appropriate for education at this  time  Skin: deep tissue injury L buttock 0.2x2cm, Stage I R ear  Incisions: Midline wound 15X8X7cm with VAC, neck  Last BM:  4/8- 325ml 82mostomy  Height:   Ht Readings from Last 1 Encounters:  03/14/21 5' 4.02" (1.626 m)    Weight:   Wt Readings from Last 1 Encounters:  03/01/21 67.2 kg    Ideal Body Weight:  54.5 kg  BMI:  Body mass index is 25.42 kg/m.  Estimated Nutritional Needs:   Kcal:  1700-1900kcal/day  Protein:  100-115g/day  Fluid:  1.4-1.7L/day  Chrisann Melaragno Koleen DistanceD, LDN Please refer to [facility] and/or RD on-call/weekend/after hours pager

## 2021-03-16 NOTE — Progress Notes (Signed)
Remains in ICU, now POD#15 from Hartman's procedure for complicated diverticulitis with gangrenous changes and perforation with colovesical fistula with 1cm cystorrhaphy.  Excellent UOP, renal function normal.  In setting of prolonged ICU stay, would recommend keeping foley 1-2 additional weeks. Would still recommend plain cystogram prior to removal, and urology can help coordinate. Call with any questions.  Nickolas Madrid, MD 03/16/2021

## 2021-03-16 NOTE — Progress Notes (Signed)
NAME:  Desiree Madden, MRN:  [id number], DOB:  03-Jul-1951, LOS: 16 ADMISSION DATE:  02/21/2021   BRIEF SYNOPSIS  Brief Pt Description:  70 y.o. Female admitted 02/21/2021 due to Diverticulitis with Colovesical fistula and UTI. On 03/01/21 she developed complicated Diverticulitis with perforation requiring Sigmoid colectomy with end colostomy and Hartman's procedure. Post procedure she returns to ICU and remains intubated with Septic Shock requiring multiple vasopressors.    Significant Hospital Events: Including procedures, antibiotic start and stop dates in addition to other pertinent events    02/21/2021:Admitted to Med/Surg unit with Diverticulitis and UTI  02/28/2021:LLQ abdominal drain placed by IR to pelvic abscess  03/01/2021:Worsening Leukocytosis despite drain placement. Found to have complicated Diverticulitis with perforation requiring Sigmoid Colectomy with end colostomy and Hartman's Procedure. Post procedure she returns to ICU and remains intubated with Septic Shock requiring multiple vasopressors. Left Femoral CVC and Arterial lines placed emergently  03/04/2021:Severe anasarca, albumin 1.8 DX of NSTEMI  3/28 failure to wean from vent  3/29 severe agitation, septic shock on pressors  3/29 failed extubation, re intubated daughter updated  3/30 ENT consulted  3/31 [facility] placed, daughter updated, made DNR status  4/1 colostomy revision, remains on vent, daughter updated  4/2 failed weaning trials   4/4 patient is on 5/5 back to put her on SAT SBT still minimally responsive and now looking for an LTAC bed.  4/5 surgical clips removed patient fascia is open but muscles are closed needs twice a day wet-to-dry packing ID saw patient yesterday and this is the recommendations Vanco cefepime and Flagyl changed to meropenem and Fluconazole on 03/06/21-day 6   as leucocytosis improved a lot may be able to DC antibiotics on day 10-- 03/15/21  4/6 no major events  overnight patient had beta-blocker restarted and here episodes of V. tach clearance has decreased.  4/7 failure to wean from vent due to muscle fatigue and ischemic cardiomyopathy  4/8 tolerating TCT for 24 hrs, off vent    Cultures:  02/21/21: SARS CoV-2 PCR>>negative 02/21/21: Influenza PCR>>negative 02/21/2021: Urine>>ENTEROCOCCUS FAECALIS, ENTEROCOCCUS FAECIUM(sensitive to vancomycin and ampicillin) 02/22/2021: HIV screen>> nonreactive 02/28/2021: LLQ Abdominal drain>>Enterococcusavium, other polymicrobial Candida albicans 03/01/2021: MRSA PCR>> negative 03/01/2021: Blood culture x2>>  Antimicrobials:  Ciprofloxacin 3/16>>3/18 Unasyn 3/23>>3/23 Flagyl 3/16>>3/18; 3/24>>3/29 Cefepime 3/24>>3/29 Vancomycin 3/24>>3/29 UNASYN 3/29-->    Interim History / Subjective:  Off vent S/p trach On TCT Alert and awake     Objective   Blood pressure 135/75, pulse 99, temperature 97.8 F (36.6 C), temperature source Axillary, resp. rate 12, height 5' 4.02" (1.626 m), weight 67.2 kg, SpO2 100 %.    FiO2 (%):  [28 %] 28 %   Intake/Output Summary (Last 24 hours) at 03/16/2021 0957 Last data filed at 03/16/2021 0867 Gross per 24 hour  Intake 2264.08 ml  Output 3225 ml  Net -960.92 ml   Filed Weights   02/27/21 0900 02/28/21 1014 03/01/21 1511  Weight: 66.9 kg 67.2 kg 67.2 kg      REVIEW OF SYSTEMS LIMITED Some abd pain, no SOB Other ROS NEG   PHYSICAL EXAMINATION:  GENERAL: ill appearing,  NECK: Supple. S/p trach PULMONARY: +rhonchi  CARDIOVASCULAR: S1 and S2. -MRG GASTROINTESTINAL:  colostomy +distention  MUSCULOSKELETAL: +edema.  NEUROLOGIC: alert and awake SKIN:intact,warm,dry   Labs/imaging that I havepersonally reviewed  (right click and "Reselect all SmartList Selections" daily)    Resolved Hospital Problem list   NA    ASSESSMENT AND PLAN SYNOPSIS 70 yo with acute and severe septic shock  with hemorrhagic shock with acute hypoxic resp  failure with septic shock and NSTEMI due to complicated Complicated Diverticulitis/Pelvic Abscess with Perforation &Gangrene of the Sigmoid Colon, Colovesical Fistula failed trial of extubation and re-intubated 3/29 Trach on 3/31 Patient with open wound fascia surgical clips removed now with dressing twice a day wet-to-dry   Hypoxic  resp failure slowly resolving TCT as tolerated Oxygen as needed   CARDIAC FAILURE-acute systolic dysfunction NSTEMI ISCHEMIC CARDIOMYOPATHY Follow up cardiology recs  CARDIAC ICU monitoring  ACUTE KIDNEY INJURY/Renal Failure -continue Foley Catheter-assess need -Avoid nephrotoxic agents -Follow urine output, BMP -Ensure adequate renal perfusion, optimize oxygenation -Renal dose medications'  SEPTIC SHOCK-resolved -use vasopressors to keep MAP>65 as needed  INFECTIOUS DISEASE ALL ABX stopped -follow up cultures -follow up ID consultation +CANDIDA ALBICANS +ENTEROCOCCUS AVIUM +CLOSTRIDIUM +BACTEROIDES   ENDO - ICU hypoglycemic\Hyperglycemia protocol -check FSBS per protocol   GI GI PROPHYLAXIS as indicated  NUTRITIONAL STATUS DIET-->TF's as tolerated Constipation protocol as indicated GENERAL SURGERY HAS DEFERRED PEG TUBE PLACEMENT BUT WILL ASSESS SPEECH THERAPY and PT and assess swallowing before we commit to PEG tube  ELECTROLYTES -follow labs as needed -replace as needed -pharmacy consultation and following    Best practice (right click and "Reselect all SmartList Selections" daily)  Diet: TF's as tolerated VAP protocol (if indicated): Yes DVT prophylaxis: SCD GI prophylaxis: PPI Glucose control: SSI Yes Central venous access: Yes, and it is still needed Arterial line: N/A Foley: N/A Mobility: bed rest PT consulted: N/A Code Status:DNR     Labs   CBC: Recent Labs  Lab 03/12/21 0431 03/13/21 0442 03/14/21 0420 03/15/21 0446 03/16/21 0525  WBC 12.7* 17.7* 12.8* 10.0 9.4  NEUTROABS  --  16.2*  10.6* 8.2* 7.0  HGB 8.6* 9.3* 8.9* 9.6* 10.1*  HCT 26.7* 28.1* 28.5* 29.8* 31.4*  MCV 95.4 93.4 96.9 97.1 95.2  PLT 180 225 213 215 812    Basic Metabolic Panel: Recent Labs  Lab 03/12/21 0431 03/13/21 0442 03/14/21 0420 03/15/21 0446 03/16/21 0525  NA 145 143 146* 148* 148*  K 4.2 3.1* 3.5 4.1 3.2*  CL 116* 112* 117* 118* 115*  CO2 $Re'25 24 24 23 24  'Fvi$ GLUCOSE 262* 196* 118* 135* 153*  BUN 48* 49* 46* 42* 39*  CREATININE 0.99 0.92 0.64 0.77 0.67  CALCIUM 7.9* 8.0* 7.9* 8.0* 8.0*  MG 1.8 1.9 2.2 1.8 1.8  PHOS 2.6 2.6 2.5 2.7 2.9   GFR: Estimated Creatinine Clearance: 61.7 mL/min (by C-G formula based on SCr of 0.67 mg/dL). Recent Labs  Lab 03/13/21 0442 03/14/21 0420 03/15/21 0446 03/16/21 0525  WBC 17.7* 12.8* 10.0 9.4    Liver Function Tests: Recent Labs  Lab 03/12/21 0431 03/13/21 0442 03/14/21 0420 03/15/21 0446 03/16/21 0525  AST 62* 104* 309* 170* 112*  ALT 32 49* 163* 129* 94*  ALKPHOS 70 63 85 89 86  BILITOT 0.7 0.7 0.7 0.8 0.5  PROT 5.5* 5.7* 5.6* 5.7* 5.7*  ALBUMIN 2.3* 2.4* 2.3* 2.4* 2.4*   No results for input(s): LIPASE, AMYLASE in the last 168 hours. No results for input(s): AMMONIA in the last 168 hours.  ABG    Component Value Date/Time   PHART 7.35 03/06/2021 1811   PCO2ART 46 03/06/2021 1811   PO2ART 100 03/06/2021 1811   HCO3 25.4 03/06/2021 1811   ACIDBASEDEF 0.4 03/06/2021 1811   O2SAT 97.4 03/06/2021 1811     Coagulation Profile: No results for input(s): INR, PROTIME in the last 168 hours.  Cardiac Enzymes: No  results for input(s): CKTOTAL, CKMB, CKMBINDEX, TROPONINI in the last 168 hours.  HbA1C: No results found for: HGBA1C  CBG: Recent Labs  Lab 03/15/21 1628 03/15/21 1924 03/15/21 2335 03/16/21 0345 03/16/21 0719  GLUCAP 206* 119* 72 123* 105*    Allergies Allergies  Allergen Reactions  . Baclofen     Other reaction(s): Headache  . Sertraline     Other reaction(s): Other (See Comments), Other (See  Comments) Muscle aches Muscle aches   . Sulfa Antibiotics     Other reaction(s): Other (See Comments), Other (See Comments) Pt was told not to take it due to colitis Pt was told not to take it due to colitis   . Ibuprofen Nausea And Vomiting  . Trazodone And Nefazodone        DVT/GI PRX  assessed I Assessed the need for Labs I Assessed the need for Foley I Assessed the need for Central Venous Line Family Discussion when available I Assessed the need for Mobilization I made an Assessment of medications to be adjusted accordingly Safety Risk assessment completed  CASE DISCUSSED IN MULTIDISCIPLINARY ROUNDS WITH ICU TEAM   Denetta Fei Patricia Pesa, M.D.  [facility]  Medical Director [place] Director [place] Department

## 2021-03-16 NOTE — Progress Notes (Signed)
*  PRELIMINARY RESULTS* Echocardiogram 2D Echocardiogram has been performed.  Desiree Madden 03/16/2021, 1:41 PM

## 2021-03-16 NOTE — Evaluation (Signed)
[facility] Speaking Valve - Evaluation Patient Details  Name: Chelbi Herber MRN: [id number] Date of Birth: 02/17/1951  Today's Date: 03/16/2021 Time: 1020-1100 SLP Time Calculation (min) (ACUTE ONLY): 40 min  Past Medical History:  Past Medical History:  Diagnosis Date  . Hypertension   . Thyroid disease    Past Surgical History:  Past Surgical History:  Procedure Laterality Date  . CARDIAC SURGERY    . COLECTOMY WITH COLOSTOMY CREATION/HARTMANN PROCEDURE Left 03/01/2021   Procedure: COLECTOMY WITH COLOSTOMY CREATION/HARTMANN PROCEDURE;  Surgeon: Ronny Bacon, MD;  Location: [facility] ORS;  Service: General;  Laterality: Left;  . COLOSTOMY REVISION N/A 03/09/2021   Procedure: COLOSTOMY REVISION;  Surgeon: Ronny Bacon, MD;  Location: [facility] ORS;  Service: General;  Laterality: N/A;  . TRACHEOSTOMY TUBE PLACEMENT N/A 03/08/2021   Procedure: TRACHEOSTOMY;  Surgeon: Clyde Canterbury, MD;  Location: [facility] ORS;  Service: ENT;  Laterality: N/A;   HPI:  Pt is a 70 y.o. female who admitted on 02/21/2021 w/ Diverticulitis with Colovesical fistula and UTI, now 36 Days Post-Op s/p Hartman's Procedure for complicated diverticulitis with gangrenous changes and perforation with feculent peritonitis and 4 days s/p colostomy revision.  Pt remained on vent failing wean; Tracheostomy placed on 03/08/2021.  Pt is now on Trach Collar O2 support weaning from full vent support on 03/15/2021.  Ongoing Wound Care and NG tube.   Assessment / Plan / Recommendation Clinical Impression  Pt seen for brief assessment to determine readiness for trial assessment, and use of, PMV for verbal communication. Tracheostomy placed on 03/08/2021 after ~2 weeks of oral intubation and failing vent wean trials. Pt is now on Trach Collar O2 support, FiO2 28% 5L, weaning from full vent support on 03/15/2021. Pt has NG tube for nutritional needs currently. Pt has a Shiley size 6 tube Cuffed but Cuff is fully deflated at Baseline. Pt presented w/  heavy Drowsiness this morning; NSG recently gave Dilaudid for pain/discomfort. Pt was able to stir and awaken briefly w/ Mod-Max verbal/tactile cues. Oral care given w/ appropriate lingual/labial responses. Post determining full Cuff deflation, pt was given instruction on "loud" phonations of "ahh" and counting 1-3 while SLP provided finger occlusion of trach. Pt was able to attain vocal cord contact and inconsistent adequate vocal quality, though often gravely as breath support waned. D/t pt's drowsiness, trials were limited but follow through and superior air flow were determined. This warrants further PMV assessment and trials.  No significant ANS changes: HR low 100s, RR 21-22, O2 sats 99-100% prior to, and during, fingerl occlusion trials. Recommended continued finger occlusion w/ NSG/pt during shift; ST services to f/u tomorrow. Frequent oral care for hygiene and stimulation of swallowing. NSG/MD updated. SLP Visit Diagnosis: Aphonia (R49.1) (tracheostomy)    SLP Assessment  Patient needs continued Speech Lanaguage Pathology Services    Follow Up Recommendations   (TBD)    Frequency and Duration min 3x week  2 weeks    PMSV Trial PMSV was placed for: n/a -- finger occlusion Able to redirect subglottic air through upper airway: Yes Able to Attain Phonation: Yes Voice Quality:  (Gravely; dyphonia) Able to Expectorate Secretions: No attempts Level of Secretion Expectoration with PMSV:  (n/a) Breath Support for Phonation: Moderately decreased Intelligibility: Intelligibility reduced Word: 0-24% accurate Respirations During Trial: 21 SpO2 During Trial: 100 % Pulse During Trial: 104 Behavior:  (Drowsy/lethargic)   Tracheostomy Tube  Additional Tracheostomy Tube Assessment Trach Collar Period: ~3 hours this morning ("since beginning of shift", per NSG) Secretion Description: min Frequency  of Tracheal Suctioning: prn Level of Secretion Expectoration: Tracheal    Vent Dependency   Vent Dependent: No    Cuff Deflation Trial  GO Tolerated Cuff Deflation: Yes Length of Time for Cuff Deflation Trial: same as TC time Behavior:  (Drowsy w/ reduced engagement; rote responses x3) Cuff Deflation Trial - Comments: at Baseline        Saprina Chuong 03/16/2021, 1:17 PM [name], MS, [place] [phone]

## 2021-03-17 ENCOUNTER — Inpatient Hospital Stay: Payer: Medicare HMO

## 2021-03-17 DIAGNOSIS — K5792 Diverticulitis of intestine, part unspecified, without perforation or abscess without bleeding: Secondary | ICD-10-CM | POA: Diagnosis not present

## 2021-03-17 LAB — GLUCOSE, CAPILLARY
Glucose-Capillary: 126 mg/dL — ABNORMAL HIGH (ref 70–99)
Glucose-Capillary: 112 mg/dL — ABNORMAL HIGH (ref 70–99)
Glucose-Capillary: 178 mg/dL — ABNORMAL HIGH (ref 70–99)
Glucose-Capillary: 157 mg/dL — ABNORMAL HIGH (ref 70–99)
Glucose-Capillary: 75 mg/dL (ref 70–99)
Glucose-Capillary: 131 mg/dL — ABNORMAL HIGH (ref 70–99)

## 2021-03-17 LAB — CBC WITH DIFFERENTIAL/PLATELET
Abs Immature Granulocytes: 0.07 10*3/uL (ref 0.00–0.07)
Basophils Absolute: 0 10*3/uL (ref 0.0–0.1)
Basophils Relative: 0 %
Eosinophils Absolute: 0.6 10*3/uL — ABNORMAL HIGH (ref 0.0–0.5)
Eosinophils Relative: 7 %
HCT: 30.7 % — ABNORMAL LOW (ref 36.0–46.0)
Hemoglobin: 10 g/dL — ABNORMAL LOW (ref 12.0–15.0)
Immature Granulocytes: 1 %
Lymphocytes Relative: 11 %
Lymphs Abs: 0.9 10*3/uL (ref 0.7–4.0)
MCH: 30.9 pg (ref 26.0–34.0)
MCHC: 32.6 g/dL (ref 30.0–36.0)
MCV: 94.8 fL (ref 80.0–100.0)
Monocytes Absolute: 0.5 10*3/uL (ref 0.1–1.0)
Monocytes Relative: 6 %
Neutro Abs: 6.3 10*3/uL (ref 1.7–7.7)
Neutrophils Relative %: 75 %
Platelets: 204 10*3/uL (ref 150–400)
RBC: 3.24 MIL/uL — ABNORMAL LOW (ref 3.87–5.11)
RDW: 17.9 % — ABNORMAL HIGH (ref 11.5–15.5)
WBC: 8.4 10*3/uL (ref 4.0–10.5)
nRBC: 0 % (ref 0.0–0.2)

## 2021-03-17 LAB — MAGNESIUM: Magnesium: 2 mg/dL (ref 1.7–2.4)

## 2021-03-17 LAB — PHOSPHORUS: Phosphorus: 2.7 mg/dL (ref 2.5–4.6)

## 2021-03-17 LAB — BASIC METABOLIC PANEL
Anion gap: 6 (ref 5–15)
BUN: 43 mg/dL — ABNORMAL HIGH (ref 8–23)
CO2: 22 mmol/L (ref 22–32)
Calcium: 8.1 mg/dL — ABNORMAL LOW (ref 8.9–10.3)
Chloride: 116 mmol/L — ABNORMAL HIGH (ref 98–111)
Creatinine, Ser: 0.71 mg/dL (ref 0.44–1.00)
GFR, Estimated: >60 mL/min (ref >60)
Glucose, Bld: 145 mg/dL — ABNORMAL HIGH (ref 70–99)
Potassium: 4.2 mmol/L (ref 3.5–5.1)
Sodium: 144 mmol/L (ref 135–145)

## 2021-03-17 NOTE — Progress Notes (Signed)
[place] [facility] Day(s): 24.   Post op day(s): 8 Days Post-Op.   Interval History:  Patient seen and examined; drowsy, but responds appropriately No acute events or new complaints overnight.  Tolerating tracheostomy collar without ventilator No abdominal pain Remains without leukocytosis this morning; down to 8.4K Hgb stable and improving; now 10. Maintaining renal function On tube feedings; goal rate Having colostomy function  Vital signs in last 24 hours: [min-max] current  Temp:  [96.9 F (36.1 C)-99.2 F (37.3 C)] 98.5 F (36.9 C) (04/09 0800) Pulse Rate:  [95-124] 95 (04/09 0800) Resp:  [17-27] 22 (04/09 0800) BP: (104-147)/(49-109) 131/75 (04/09 0800) SpO2:  [99 %-100 %] 100 % (04/09 0800) FiO2 (%):  [28 %] 28 % (04/09 1127)     Height: 5' 4.02" (162.6 cm) Weight: 67.2 kg BMI (Calculated): 25.42   Intake/Output last 2 shifts:  04/08 0701 - 04/09 0700 In: 2821.1 [NG/GT:2471.1; IV Piggyback:350] Out: 4300 [Urine:4100; Drains:25; Stool:175]   Physical Exam:  Constitutional: more alert, participatory, NAD Respiratory: s/p tracheostomy Cardiovascular: tachycardic and sinus rhythm  Gastrointestinal: soft, non-tender, and non-distended. Colostomy in the left mid-abdomen, pink/patent, stool in bac Integumentary: Midline wound healing via secondary intention; no erythema; VAC in place with good seal  Labs:  CBC Latest Ref Rng & Units 03/17/2021 03/16/2021 03/15/2021  WBC 4.0 - 10.5 K/uL 8.4 9.4 10.0  Hemoglobin 12.0 - 15.0 g/dL 10.0(L) 10.1(L) 9.6(L)  Hematocrit 36.0 - 46.0 % 30.7(L) 31.4(L) 29.8(L)  Platelets 150 - 400 K/uL 204 221 215   CMP Latest Ref Rng & Units 03/17/2021 03/16/2021 03/15/2021  Glucose 70 - 99 mg/dL 145(H) 153(H) 135(H)  BUN 8 - 23 mg/dL 43(H) 39(H) 42(H)  Creatinine 0.44 - 1.00 mg/dL 0.71 0.67 0.77  Sodium 135 - 145 mmol/L 144 148(H) 148(H)  Potassium 3.5 - 5.1 mmol/L 4.2 3.2(L) 4.1  Chloride 98 - 111 mmol/L  116(H) 115(H) 118(H)  CO2 22 - 32 mmol/L $RemoveB'22 24 23  'OLCPkRvl$ Calcium 8.9 - 10.3 mg/dL 8.1(L) 8.0(L) 8.0(L)  Total Protein 6.5 - 8.1 g/dL - 5.7(L) 5.7(L)  Total Bilirubin 0.3 - 1.2 mg/dL - 0.5 0.8  Alkaline Phos 38 - 126 U/L - 86 89  AST 15 - 41 U/L - 112(H) 170(H)  ALT 0 - 44 U/L - 94(H) 129(H)    Imaging studies:No new pertinent imaging studies   Assessment/Plan:  70 y.o. female 13 Days Post-Op s/p Hartman's Procedurefor complicated diverticulitis with gangrenous changes and perforation with feculent peritonitis and 5 days s/p colostomy revision    - Okay to continue goal rate tube feedings; ? Role for PEG placement, may need to discuss with GI as there is not much room for surgical placement - Appreciate PCCM assistance;  - Continue Abx (Meropenem); ID on board - Monitor abdominal examination - Monitor colostomy output; WOC RN following - Monitor leukocytosis; resolved x48 hours - Maintain foley catheter; monitor UO; d/w with urology, recommend leaving x1 month from date of surgery and performing cystogram prior to removal consideration - Midline wound care: Wound vac placed with WOC RN; MWF schedule as amenable  - Disposition plan for LTACH at this time

## 2021-03-17 NOTE — Progress Notes (Addendum)
NAME:  Kaytee Taliercio, MRN:  [id number], DOB:  11-12-1951, LOS: 24 ADMISSION DATE:  02/21/2021   BRIEF SYNOPSIS  POD #16  Brief Pt Description:  70 y.o. Female admitted 02/21/2021 due to Diverticulitis with Colovesical fistula and UTI. On 03/01/21 she developed complicated Diverticulitis with perforation requiring Sigmoid colectomy with end colostomy and Hartman's procedure. Post procedure she returns to ICU and remains intubated with Septic Shock requiring multiple vasopressors.    Significant Hospital Events: Including procedures, antibiotic start and stop dates in addition to other pertinent events    02/21/2021:Admitted to Med/Surg unit with Diverticulitis and UTI  02/28/2021:LLQ abdominal drain placed by IR to pelvic abscess  03/01/2021:Worsening Leukocytosis despite drain placement. Found to have complicated Diverticulitis with perforation requiring Sigmoid Colectomy with end colostomy and Hartman's Procedure. Post procedure she returns to ICU and remains intubated with Septic Shock requiring multiple vasopressors. Left Femoral CVC and Arterial lines placed emergently  03/04/2021:Severe anasarca, albumin 1.8 DX of NSTEMI  3/28 failure to wean from vent  3/29 severe agitation, septic shock on pressors  3/29 failed extubation, re intubated daughter updated  3/30 ENT consulted  3/31 [facility] placed, daughter updated, made DNR status  4/1 colostomy revision, remains on vent, daughter updated  4/2 failed weaning trials   4/4 patient is on 5/5 back to put her on SAT SBT still minimally responsive and now looking for an LTAC bed.  4/5 surgical clips removed patient fascia is open but muscles are closed needs twice a day wet-to-dry packing ID saw patient yesterday and this is the recommendations Vanco cefepime and Flagyl changed to meropenem and Fluconazole on 03/06/21-day 6   as leucocytosis improved a lot may be able to DC antibiotics on day 10-- 03/15/21  4/6 no major  events overnight patient had beta-blocker restarted and here episodes of V. tach clearance has decreased.  4/7 failure to wean from vent due to muscle fatigue and ischemic cardiomyopathy  4/8 tolerating TCT for 24 hrs, off vent  4/9 tolerating trach collar transition to stepdown    Cultures:  02/21/21: SARS CoV-2 PCR>>negative 02/21/21: Influenza PCR>>negative 02/21/2021: Urine>>ENTEROCOCCUS FAECALIS, ENTEROCOCCUS FAECIUM(sensitive to vancomycin and ampicillin) 02/22/2021: HIV screen>> nonreactive 02/28/2021: LLQ Abdominal drain>>Enterococcusavium, other polymicrobial Candida albicans 03/01/2021: MRSA PCR>> negative 03/01/2021: Blood culture x2>>  Antimicrobials:  Ciprofloxacin 3/16>>3/18 Unasyn 3/23>>3/23 Flagyl 3/16>>3/18; 3/24>>3/29 Cefepime 3/24>>3/29 Vancomycin 3/24>>3/29 UNASYN 3/29-->3/30 Meropenem 3/30>>    Interim History / Subjective:  Off vent S/p trach On TCT Alert and awake Occasionally impulsive, pulled out NG  Objective   Blood pressure 137/76, pulse (!) 108, temperature 98.5 F (36.9 C), temperature source Oral, resp. rate 20, height 5' 4.02" (1.626 m), weight 67.2 kg, SpO2 100 %.    FiO2 (%):  [28 %] 28 %   Intake/Output Summary (Last 24 hours) at 03/17/2021 1553 Last data filed at 03/17/2021 1100 Gross per 24 hour  Intake 645.42 ml  Output 4275 ml  Net -3629.58 ml   Filed Weights   02/27/21 0900 02/28/21 1014 03/01/21 1511  Weight: 66.9 kg 67.2 kg 67.2 kg      REVIEW OF SYSTEMS LIMITED Some abd pain, no SOB Other ROS NEG   PHYSICAL EXAMINATION:  GENERAL: Acute on chronically ill appearing,  NECK: Supple. S/p trach PULMONARY: +rhonchi no wheezes noted  CARDIOVASCULAR: S1 and S2. -MRG GASTROINTESTINAL:  colostomy +distention, wound VAC in place, stoma intact with stool in bag MUSCULOSKELETAL: +1 anasarca NEUROLOGIC: alert and awake, no overt focal deficit SKIN:intact,warm,dry GU: Foley in place  Resolved  Hospital Problem list    NA   ASSESSMENT AND PLAN SYNOPSIS 70 yo with acute and severe septic shock with hemorrhagic shock with acute hypoxic resp failure with septic shock and NSTEMI due to complicated Complicated Diverticulitis/Pelvic Abscess with Perforation &Gangrene of the Sigmoid Colon, Colovesical Fistula failed trial of extubation and re-intubated 3/29Trach on 3/31 Patient with wound VAC   Hypoxic  resp failure slowly resolving TCT as tolerated Oxygen as needed   CARDIAC FAILURE-acute systolic dysfunction NSTEMI ISCHEMIC CARDIOMYOPATHY Follow up cardiology recs  CARDIAC ICU monitoring  ACUTE KIDNEY INJURY/Renal Failure -continue Foley Catheter will need for another 1-2 more weeks -Urology can coordinate Foley removal, will need cystogram prior to removal -Avoid nephrotoxic agents -Follow urine output, BMP -Ensure adequate renal perfusion, optimize oxygenation -Renal dose medications'  SEPTIC SHOCK-resolved -use vasopressors to keep MAP>65 as needed  INFECTIOUS DISEASE On meropenem -follow up ID consultation +CANDIDA ALBICANS +ENTEROCOCCUS AVIUM +CLOSTRIDIUM +BACTEROIDES   ENDO - ICU hypoglycemic\Hyperglycemia protocol -check FSBS per protocol   GI GI PROPHYLAXIS as indicated  NUTRITIONAL STATUS DIET-->TF's as tolerated Constipation protocol as indicated GENERAL SURGERY HAS DEFERRED PEG TUBE PLACEMENT BUT WILL ASSESS SPEECH THERAPY and PT and assess swallowing before we commit to PEG tube  ELECTROLYTES -follow labs as needed -replace as needed -pharmacy consultation and following    Best practice (right click and "Reselect all SmartList Selections" daily)  Diet: TF's as tolerated VAP protocol (if indicated): Yes DVT prophylaxis: SCD GI prophylaxis: PPI Glucose control: SSI Yes Central venous access: Yes, and it is still needed Arterial line: N/A Foley: Will need to remain in place for 1 month from date of surgery, reassess with cystogram prior to  removing (colovesical fistula) urology can help coordinate Mobility: bed rest PT consulted: N/A Code Status:DNR     Labs   CBC: Recent Labs  Lab 03/13/21 0442 03/14/21 0420 03/15/21 0446 03/16/21 0525 03/17/21 0500  WBC 17.7* 12.8* 10.0 9.4 8.4  NEUTROABS 16.2* 10.6* 8.2* 7.0 6.3  HGB 9.3* 8.9* 9.6* 10.1* 10.0*  HCT 28.1* 28.5* 29.8* 31.4* 30.7*  MCV 93.4 96.9 97.1 95.2 94.8  PLT 225 213 215 221 967    Basic Metabolic Panel: Recent Labs  Lab 03/13/21 0442 03/14/21 0420 03/15/21 0446 03/16/21 0525 03/17/21 0500  NA 143 146* 148* 148* 144  K 3.1* 3.5 4.1 3.2* 4.2  CL 112* 117* 118* 115* 116*  CO2 24 24 23 24 22   GLUCOSE 196* 118* 135* 153* 145*  BUN 49* 46* 42* 39* 43*  CREATININE 0.92 0.64 0.77 0.67 0.71  CALCIUM 8.0* 7.9* 8.0* 8.0* 8.1*  MG 1.9 2.2 1.8 1.8 2.0  PHOS 2.6 2.5 2.7 2.9 2.7   GFR: Estimated Creatinine Clearance: 61.7 mL/min (by C-G formula based on SCr of 0.71 mg/dL). Recent Labs  Lab 03/14/21 0420 03/15/21 0446 03/16/21 0525 03/17/21 0500  WBC 12.8* 10.0 9.4 8.4    Liver Function Tests: Recent Labs  Lab 03/12/21 0431 03/13/21 0442 03/14/21 0420 03/15/21 0446 03/16/21 0525  AST 62* 104* 309* 170* 112*  ALT 32 49* 163* 129* 94*  ALKPHOS 70 63 85 89 86  BILITOT 0.7 0.7 0.7 0.8 0.5  PROT 5.5* 5.7* 5.6* 5.7* 5.7*  ALBUMIN 2.3* 2.4* 2.3* 2.4* 2.4*   No results for input(s): LIPASE, AMYLASE in the last 168 hours. No results for input(s): AMMONIA in the last 168 hours.  ABG    Component Value Date/Time   PHART 7.35 03/06/2021 1811   PCO2ART 46 03/06/2021 1811  PO2ART 100 03/06/2021 1811   HCO3 25.4 03/06/2021 1811   ACIDBASEDEF 0.4 03/06/2021 1811   O2SAT 97.4 03/06/2021 1811     Coagulation Profile: No results for input(s): INR, PROTIME in the last 168 hours.  Cardiac Enzymes: No results for input(s): CKTOTAL, CKMB, CKMBINDEX, TROPONINI in the last 168 hours.  HbA1C: No results found for: HGBA1C  CBG: Recent  Labs  Lab 03/16/21 2332 03/17/21 0342 03/17/21 0726 03/17/21 1048 03/17/21 1547  GLUCAP 137* 126* 112* 178* 157*    Allergies Allergies  Allergen Reactions  . Baclofen     Other reaction(s): Headache  . Sertraline     Other reaction(s): Other (See Comments), Other (See Comments) Muscle aches Muscle aches   . Sulfa Antibiotics     Other reaction(s): Other (See Comments), Other (See Comments) Pt was told not to take it due to colitis Pt was told not to take it due to colitis   . Ibuprofen Nausea And Vomiting  . Trazodone And Nefazodone     Scheduled Meds: . amiodarone  200 mg Per Tube BID  . chlorhexidine gluconate (MEDLINE KIT)  15 mL Mouth Rinse BID  . Chlorhexidine Gluconate Cloth  6 each Topical Daily  . docusate  100 mg Per Tube BID  . enoxaparin (LOVENOX) injection  40 mg Subcutaneous Q24H  . free water  200 mL Per Tube Q4H  . insulin aspart  0-20 Units Subcutaneous Q4H  . levothyroxine  75 mcg Per Tube Q0600  . mouth rinse  15 mL Mouth Rinse 10 times per day  . midodrine  10 mg Per Tube TID WC  . multivitamin with minerals  1 tablet Per Tube Daily  . nutrition supplement (JUVEN)  1 packet Per Tube BID BM  . pantoprazole (PROTONIX) IV  40 mg Intravenous Daily  . polyethylene glycol  17 g Per Tube Daily  . sodium chloride flush  10-40 mL Intracatheter Q12H  . sodium chloride flush  5 mL Intracatheter Q8H   Continuous Infusions: . sodium chloride Stopped (03/02/21 1553)  . feeding supplement (VITAL AF 1.2 CAL) 1,000 mL (03/16/21 1452)   PRN Meds:.ALPRAZolam, HYDROmorphone (DILAUDID) injection, ipratropium-albuterol, lip balm, ondansetron **OR** ondansetron (ZOFRAN) IV, oxyCODONE, sodium chloride flush  Anti-infectives (From admission, onward)   Start     Dose/Rate Route Frequency Ordered Stop   03/13/21 1400  meropenem (MERREM) 1 g in sodium chloride 0.9 % 100 mL IVPB        1 g 200 mL/hr over 30 Minutes Intravenous Every 8 hours 03/13/21 0855 03/16/21 2203    03/12/21 1800  meropenem (MERREM) 1 g in sodium chloride 0.9 % 100 mL IVPB  Status:  Discontinued        1 g 200 mL/hr over 30 Minutes Intravenous Every 12 hours 03/12/21 0840 03/13/21 0855   03/10/21 1000  meropenem (MERREM) 1 g in sodium chloride 0.9 % 100 mL IVPB  Status:  Discontinued        1 g 200 mL/hr over 30 Minutes Intravenous Every 8 hours 03/10/21 0854 03/12/21 0840   03/09/21 0600  fluconazole (DIFLUCAN) IVPB 200 mg  Status:  Discontinued        200 mg 100 mL/hr over 60 Minutes Intravenous Every 24 hours 03/08/21 0751 03/14/21 1014   03/08/21 2200  meropenem (MERREM) 1 g in sodium chloride 0.9 % 100 mL IVPB  Status:  Discontinued        1 g 200 mL/hr over 30 Minutes Intravenous Every 12 hours  03/08/21 0748 03/10/21 0854   03/08/21 0600  fluconazole (DIFLUCAN) IVPB 400 mg  Status:  Discontinued        400 mg 100 mL/hr over 120 Minutes Intravenous Every 24 hours 03/07/21 1321 03/08/21 0839   03/07/21 1100  meropenem (MERREM) 1 g in sodium chloride 0.9 % 100 mL IVPB  Status:  Discontinued        1 g 200 mL/hr over 30 Minutes Intravenous Every 8 hours 03/07/21 1013 03/08/21 0748   03/07/21 1000  meropenem (MERREM) 1 g in sodium chloride 0.9 % 100 mL IVPB  Status:  Discontinued        1 g 200 mL/hr over 30 Minutes Intravenous Every 12 hours 03/06/21 2243 03/07/21 1013   03/07/21 0600  fluconazole (DIFLUCAN) IVPB 400 mg        400 mg 100 mL/hr over 120 Minutes Intravenous  Once 03/06/21 2243 03/07/21 0701   03/04/21 1800  vancomycin (VANCOREADY) IVPB 750 mg/150 mL        750 mg 150 mL/hr over 60 Minutes Intravenous Every 24 hours 03/03/21 1442 03/06/21 2040   03/03/21 0800  vancomycin (VANCOREADY) IVPB 1500 mg/300 mL  Status:  Discontinued        1,500 mg 150 mL/hr over 120 Minutes Intravenous Every 48 hours 03/02/21 1410 03/03/21 1442   03/02/21 0800  vancomycin (VANCOREADY) IVPB 750 mg/150 mL  Status:  Discontinued        750 mg 150 mL/hr over 60 Minutes Intravenous Every  24 hours 03/01/21 1954 03/02/21 1409   03/01/21 2200  ceFEPIme (MAXIPIME) 2 g in sodium chloride 0.9 % 100 mL IVPB        2 g 200 mL/hr over 30 Minutes Intravenous Every 12 hours 03/01/21 1903 03/06/21 2203   03/01/21 2100  vancomycin (VANCOREADY) IVPB 1250 mg/250 mL        1,250 mg 166.7 mL/hr over 90 Minutes Intravenous  Once 03/01/21 1954 03/02/21 0008   03/01/21 2000  metroNIDAZOLE (FLAGYL) IVPB 500 mg        500 mg 100 mL/hr over 60 Minutes Intravenous Every 8 hours 03/01/21 1903 03/06/21 2002   02/28/21 1800  piperacillin-tazobactam (ZOSYN) IVPB 3.375 g  Status:  Discontinued        3.375 g 12.5 mL/hr over 240 Minutes Intravenous Every 8 hours 02/28/21 1643 03/01/21 1903   02/28/21 1730  piperacillin-tazobactam (ZOSYN) IVPB 3.375 g  Status:  Discontinued        3.375 g 100 mL/hr over 30 Minutes Intravenous Every 8 hours 02/28/21 1635 02/28/21 1642   02/28/21 1300  Ampicillin-Sulbactam (UNASYN) 3 g in sodium chloride 0.9 % 100 mL IVPB  Status:  Discontinued        3 g 200 mL/hr over 30 Minutes Intravenous Every 6 hours 02/28/21 1156 02/28/21 1635   02/23/21 1230  piperacillin-tazobactam (ZOSYN) IVPB 3.375 g  Status:  Discontinued        3.375 g 12.5 mL/hr over 240 Minutes Intravenous Every 8 hours 02/23/21 1143 02/28/21 1155   02/22/21 0600  ciprofloxacin (CIPRO) IVPB 400 mg  Status:  Discontinued        400 mg 200 mL/hr over 60 Minutes Intravenous Every 12 hours 02/21/21 2028 02/23/21 1143   02/22/21 0400  metroNIDAZOLE (FLAGYL) IVPB 500 mg  Status:  Discontinued        500 mg 100 mL/hr over 60 Minutes Intravenous Every 8 hours 02/21/21 2028 02/23/21 1143   02/21/21 1830  ciprofloxacin (CIPRO) IVPB 400  mg        400 mg 200 mL/hr over 60 Minutes Intravenous  Once 02/21/21 1820 02/21/21 1939   02/21/21 1830  metroNIDAZOLE (FLAGYL) IVPB 500 mg        500 mg 100 mL/hr over 60 Minutes Intravenous  Once 02/21/21 1820 02/21/21 2110      Level 3 follow-up   Patient can be  transitioned to stepdown level care, will transition to [facility] hospitalist service.  Renold Don, MD [facility]   *This note was dictated using voice recognition software/Dragon.  Despite best efforts to proofread, errors can occur which can change the meaning.  Any change was purely unintentional.

## 2021-03-17 NOTE — Progress Notes (Signed)
Patient Name: Desiree Madden Date of Encounter: 03/17/2021  Hospital Problem List     Principal Problem:   Diverticulitis Active Problems:   Fistula, bladder   Ulcerative colitis ([place])   Hyperlipidemia   Essential hypertension   Hypothyroidism   Abnormal ECG   CKD (chronic kidney disease), stage III (HCC)   Elevated lactic acid level   Protein-calorie malnutrition, severe    Patient Profile     Patient is a 70 year old female admitted on March 16 of 2022 with diverticulitis.  She underwent colostomy and Hartman's procedure.  She was diagnosed with septic shock.  Has had prolonged intubation followed by trach placement.  Had a colostomy revision on 4 1.  Continue to fail weaning parameters.  Awaiting LTAC bed.  Has had episodes of wide-complex tachycardia.  Currently controlled with amiodarone 200 mg twice daily per tube.  Is on midodrine for soft blood pressure.  Is on broad-spectrum antibiotics..  Subjective   Lethargic  Inpatient Medications    . amiodarone  200 mg Per Tube BID  . chlorhexidine gluconate (MEDLINE KIT)  15 mL Mouth Rinse BID  . Chlorhexidine Gluconate Cloth  6 each Topical Daily  . docusate  100 mg Per Tube BID  . enoxaparin (LOVENOX) injection  40 mg Subcutaneous Q24H  . free water  200 mL Per Tube Q4H  . insulin aspart  0-20 Units Subcutaneous Q4H  . levothyroxine  75 mcg Per Tube Q0600  . mouth rinse  15 mL Mouth Rinse 10 times per day  . midodrine  10 mg Per Tube TID WC  . multivitamin with minerals  1 tablet Per Tube Daily  . nutrition supplement (JUVEN)  1 packet Per Tube BID BM  . pantoprazole (PROTONIX) IV  40 mg Intravenous Daily  . polyethylene glycol  17 g Per Tube Daily  . sodium chloride flush  10-40 mL Intracatheter Q12H  . sodium chloride flush  5 mL Intracatheter Q8H    Vital Signs    Vitals:   03/17/21 0400 03/17/21 0500 03/17/21 0600 03/17/21 0800  BP: 129/79 128/86 (!) 147/81 131/75  Pulse: (!) 107 (!) 111 (!) 117 95  Resp:  (!) 27 20 (!) 21 (!) 22  Temp: (!) 97.4 F (36.3 C)   98.5 F (36.9 C)  TempSrc: Axillary   Oral  SpO2: 100% 100% 100% 100%  Weight:      Height:        Intake/Output Summary (Last 24 hours) at 03/17/2021 1132 Last data filed at 03/17/2021 1100 Gross per 24 hour  Intake 1309 ml  Output 4775 ml  Net -3466 ml   Filed Weights   02/27/21 0900 02/28/21 1014 03/01/21 1511  Weight: 66.9 kg 67.2 kg 67.2 kg    Physical Exam    GEN: Chronically ill-appearing female HEENT: normal.  Neck: Supple, no JVD, carotid bruits, or masses. Cardiac: RRR, no murmurs, rubs, or gallops. No clubbing, cyanosis, edema.  Radials/DP/PT 2+ and equal bilaterally.  Respiratory:  Respirations regular and unlabored, clear to auscultation bilaterally. GI: Soft, nontender, nondistended, BS + x 4. MS: no deformity or atrophy. Skin: warm and dry, no rash.   Labs    CBC Recent Labs    03/16/21 0525 03/17/21 0500  WBC 9.4 8.4  NEUTROABS 7.0 6.3  HGB 10.1* 10.0*  HCT 31.4* 30.7*  MCV 95.2 94.8  PLT 221 643   Basic Metabolic Panel Recent Labs    03/16/21 0525 03/17/21 0500  NA 148* 144  K  3.2* 4.2  CL 115* 116*  CO2 24 22  GLUCOSE 153* 145*  BUN 39* 43*  CREATININE 0.67 0.71  CALCIUM 8.0* 8.1*  MG 1.8 2.0  PHOS 2.9 2.7   Liver Function Tests Recent Labs    03/15/21 0446 03/16/21 0525  AST 170* 112*  ALT 129* 94*  ALKPHOS 89 86  BILITOT 0.8 0.5  PROT 5.7* 5.7*  ALBUMIN 2.4* 2.4*   No results for input(s): LIPASE, AMYLASE in the last 72 hours. Cardiac Enzymes No results for input(s): CKTOTAL, CKMB, CKMBINDEX, TROPONINI in the last 72 hours. BNP No results for input(s): BNP in the last 72 hours. D-Dimer No results for input(s): DDIMER in the last 72 hours. Hemoglobin A1C No results for input(s): HGBA1C in the last 72 hours. Fasting Lipid Panel No results for input(s): CHOL, HDL, LDLCALC, TRIG, CHOLHDL, LDLDIRECT in the last 72 hours. Thyroid Function Tests No results for  input(s): TSH, T4TOTAL, T3FREE, THYROIDAB in the last 72 hours.  Invalid input(s): FREET3  Telemetry    Sinus tachycardia at a rate of 119.  Pressure 131/94 pulse ox 100%  ECG    Sinus tachycardia  Radiology    DG Abd 1 View  Result Date: 03/08/2021 CLINICAL DATA:  NG tube placement. EXAM: ABDOMEN - 1 VIEW COMPARISON:  03/08/2021. FINDINGS: NG tube tip coiled in the stomach. No gastric or bowel distention. Ostomy noted over the left lower quadrant. Drainage catheter noted left lower quadrant. Prior CABG. IMPRESSION: NG tube noted with tip coiled in the stomach. Electronically Signed   By: Marcello Moores  Register   On: 03/08/2021 14:00   DG Abd 1 View  Result Date: 03/08/2021 CLINICAL DATA:  Nasogastric tube placement. EXAM: ABDOMEN - 1 VIEW COMPARISON:  None. FINDINGS: The bowel gas pattern is normal. Nasogastric tube tip is seen in proximal stomach. Colostomy is noted in left lower quadrant. No radio-opaque calculi or other significant radiographic abnormality are seen. IMPRESSION: Nasogastric tube tip seen in proximal stomach. No evidence of bowel obstruction or ileus. Electronically Signed   By: Marijo Conception M.D.   On: 03/08/2021 11:04   DG Abd 1 View  Result Date: 03/01/2021 CLINICAL DATA:  OG tube placement EXAM: ABDOMEN - 1 VIEW COMPARISON:  02/23/2021 FINDINGS: OG tube tip is in the stomach. Nonobstructive bowel gas pattern. No free air. IMPRESSION: OG tube tip in the stomach. Electronically Signed   By: Rolm Baptise M.D.   On: 03/01/2021 19:27   CT ABDOMEN PELVIS W CONTRAST  Result Date: 02/28/2021 CLINICAL DATA:  Left lower quadrant abdominal pain. History diverticulitis. EXAM: CT ABDOMEN AND PELVIS WITH CONTRAST TECHNIQUE: Multidetector CT imaging of the abdomen and pelvis was performed using the standard protocol following bolus administration of intravenous contrast. CONTRAST:  68m OMNIPAQUE IOHEXOL 300 MG/ML  SOLN COMPARISON:  CT scan 02/24/2021 FINDINGS: Lower chest: Margins  persistent small bilateral pleural effusions with overlying atelectasis. The heart is normal in size. No pericardial effusion. Stable aortic and advanced coronary artery calcifications. Surgical changes from coronary artery bypass surgery. The distal esophagus is grossly normal. Hepatobiliary: No biliary no hepatic lesions or intrahepatic biliary dilatation. The gallbladder is unremarkable. Persistent perihepatic fluid. No common bile duct dilatation. Pancreas: No mass, inflammation or ductal dilatation. Spleen: Normal size.  No focal lesions. Adrenals/Urinary Tract: The adrenal glands are normal. No renal lesions or hydroureteronephrosis. There is a large amount of gas in the bladder most likely from recent catheterization. Stomach/Bowel: The stomach, duodenum and small bowel are unremarkable. No  acute inflammatory process or obstructive findings. Contrast gets all the way to the colon. Diffuse scattered colonic diverticulosis. Areas of colonic inflammation noted involving the descending colon and almost the entire sigmoid colon with marked wall thickening and submucosal edema. There is a new pericolonic abscess with a possible intramural component but definite extramural component in the left pelvis measuring a maximum of 8.3 x 4.7 cm. It appears to contain a small amount of dependent contrast material suggesting active extravasation. Vascular/Lymphatic: Stable advanced atherosclerotic calcifications involving the aorta and iliac arteries. No aneurysm. The major venous structures are patent. There are numerous scattered borderline retroperitoneal lymph nodes which are likely inflammatory/reactive. Reproductive: The uterus and ovaries are grossly normal in stable. Other: Diffuse subcutaneous soft tissue swelling/edema suggesting anasarca. There is also scattered free abdominal and free pelvic fluid. Musculoskeletal: If no significant bony findings. IMPRESSION: 1. Persistent changes of acute severe diverticulitis  involving the descending colon and almost the entire sigmoid colon. 2. New pericolonic abscess measuring a maximum of 8.3 x 4.7 cm. It appears to contain a small amount of dependent contrast material suggesting active extravasation. 3. Persistent small bilateral pleural effusions with overlying atelectasis. 4. Persistent perihepatic and free pelvic fluid. 5. Diffuse subcutaneous soft tissue swelling/edema suggesting anasarca. 6. Stable advanced atherosclerotic calcifications involving the aorta and iliac arteries. 7. Large amount of gas in the bladder most likely from recent catheterization. 8. Aortic atherosclerosis. These results will be called to the ordering clinician or representative by the Radiologist Assistant, and communication documented in the PACS or [employer]. Aortic Atherosclerosis (ICD10-I70.0). Electronically Signed   By: Marijo Sanes M.D.   On: 02/28/2021 13:43   CT ABDOMEN PELVIS W CONTRAST  Result Date: 02/24/2021 CLINICAL DATA:  Lower abdominal and pelvic pain EXAM: CT ABDOMEN AND PELVIS WITH CONTRAST TECHNIQUE: Multidetector CT imaging of the abdomen and pelvis was performed using the standard protocol following bolus administration of intravenous contrast. CONTRAST:  49m OMNIPAQUE IOHEXOL 300 MG/ML  SOLN COMPARISON:  02/21/2021 FINDINGS: Lower chest: New atelectasis in the lower lobes and lingula. Trace bilateral pleural effusions are likewise new. Prior CABG. Coronary and aortic atherosclerosis. Contrast medium in the distal esophagus suggest dysmotility or reflux. Hepatobiliary: Borderline distended gallbladder. No biliary dilatation. No focal hepatic parenchymal lesion identified. Pancreas: Unremarkable Spleen: Unremarkable Adrenals/Urinary Tract: Both adrenal glands appear within normal limits. Borderline right hydroureter extending to the iliac vessel cross over with the ureters obscured by surrounding inflammatory findings. No hydronephrosis or delay in excretion of contrast  medium. Small amount of left lateral perinephric edema. Stomach/Bowel: Normal appendix. Descending and sigmoid colon diverticulosis with notably prominent caliber of the sigmoid colon, considerable paracolic stranding around the distal descending and sigmoid colon, and substantial localized wall thickening along a 9 cm segment of the distal sigmoid colon for example on image 70 of series 2 suspicious for progressive diverticulitis and likely some superimposed regional colitis. Air-fluid level in the rectum. Peripheral intermediate density in the cecum for example on image 51 of series 2 simulates wall thickening, but there is no wall thickening in this region on 02/21/2021 hence this appearance is due to isodense fluid rather than actual cecal wall thickening. Vascular/Lymphatic: Aortoiliac atherosclerotic vascular disease. Scattered likely reactive retroperitoneal lymph nodes including an index left periaortic node measuring 0.8 cm in short axis on image 48 series 2. Reproductive: Unremarkable Other: New perihepatic and perisplenic ascites along with fluid tracking along the paracolic gutter. New pelvic ascites. No free intraperitoneal gas is identified. I do not observe  a discrete drainable abscess. Musculoskeletal: Unchanged compression fracture at T11. Lower lumbar spondylosis and degenerative disc disease. IMPRESSION: 1. Worsened prominent paracolic stranding around the distal descending and sigmoid colon, and substantial localized wall thickening along a 9 cm segment of the distal sigmoid colon suspicious for progressive diverticulitis and likely some superimposed regional colitis. No free intraperitoneal gas or discrete drainable abscess identified. 2. New perihepatic and perisplenic ascites along with fluid tracking along the paracolic gutters and pelvic ascites. 3. Trace bilateral pleural effusions with new atelectasis in the lower lobes and lingula. 4. Contrast medium in the distal esophagus suggest  dysmotility or reflux. 5. Unchanged compression fracture at T11. 6. Borderline distended gallbladder. 7. Borderline right hydroureter extending to the iliac vessel cross over with the ureters obscured by surrounding inflammatory findings. No hydronephrosis or delay in excretion of contrast medium. 8. Aortic atherosclerosis. Aortic Atherosclerosis (ICD10-I70.0). Electronically Signed   By: Van Clines M.D.   On: 02/24/2021 13:58   CT ABDOMEN PELVIS W CONTRAST  Result Date: 02/21/2021 CLINICAL DATA:  Lower abdominal pain. History of diverticulitis. 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 EXAM: CT ABDOMEN AND PELVIS WITH CONTRAST TECHNIQUE: Multidetector CT imaging of the abdomen and pelvis was performed using the standard protocol following bolus administration of intravenous contrast. CONTRAST:  75m OMNIPAQUE IOHEXOL 300 MG/ML  SOLN COMPARISON:  None. FINDINGS: Lower chest: No basilar consolidation or pleural effusion. Minimal posterior pleural thickening on the left. Coronary artery calcifications. Epicardial leads in place. Hepatobiliary: Minimal focal fatty infiltration adjacent to the gallbladder fossa. No suspicious hepatic lesion. No calcified gallstone or pericholecystic fat stranding. No biliary dilatation. Pancreas: No ductal dilatation or inflammation. Spleen: Normal in size without focal abnormality. Adrenals/Urinary Tract: Normal adrenal glands. Lobulated bilateral renal contours. Slight atrophy of left kidney. No hydronephrosis. No perinephric edema. Tiny cyst in the upper left kidney. No visualized renal stone. Symmetric excretion on delayed phase imaging. The urinary bladder is displaced into the left pelvis. There is a in bladder or fluid level. No perivesicular fat stranding. No definite bladder wall thickening. Loss of fat plane between the superior aspect of the urinary bladder and a sigmoid colonic diverticulum. Stomach/Bowel: Pericolonic fat stranding  about the proximal sigmoid colon, series 2, image 62, in the region of multiple colonic diverticula suspicious for diverticulitis. This fat stranding extends to the adjacent urinary bladder. There are numerable colonic diverticula from the splenic flexure distally. Large volume of colonic stool from the splenic flexure distally. Mild sigmoid colonic wall thickening which is nonspecific. There are scattered right colonic diverticula. Transverse colon is tortuous. Small hiatal hernia. Stomach is decompressed. No small bowel obstruction or inflammation. Normal air-filled appendix. Vascular/Lymphatic: Moderately advanced aortic atherosclerosis. Irregular plaque in the upper abdominal aorta. Incidental 2 right and left renal arteries. No aneurysm. Calcified plaque throughout the left common iliac artery causes some degree of stenosis. Patent portal vein. No portal or mesenteric venous gas. Multiple small retroperitoneal lymph nodes, typically reactive. No bulky abdominopelvic adenopathy. Reproductive: Uterus remains in situ, atrophic and deviated to the left. No adnexal mass. Other: No free air or focal fluid collection. Minimal free fluid in the left pelvis. No abdominal wall hernia. Musculoskeletal: Chronic but progressive moderately severe T11 compression fracture. Increase vertebral body height loss since 11/23/2019 thoracic spine CT, particularly anteriorly. Hemi transitional lumbosacral anatomy with enlarged right transverse process and pseudoarticulation with the sacrum. Bones are subjectively under mineralized. IMPRESSION: 1. Advanced colonic diverticulosis with pericolonic fat stranding about the proximal sigmoid colon,  in the region of multiple colonic diverticula, suspicious for acute diverticulitis. This fat stranding extends to the adjacent urinary bladder. 2. Fluid level in the urinary bladder. This may be due to Foley catheter/instrumentation, or fistula as provided history. Pericolonic edema extends to  the anterior aspect of the bladder. 3. Large volume of colonic stool from the splenic flexure distally, suggesting constipation. There is wall thickening in the mid sigmoid colon that is nonspecific. Recommend up-to-date colonoscopy to exclude the possibility of colonic neoplasm. 4. Chronic but progressive moderately severe T11 compression fracture. Increase vertebral body height loss since 11/23/2019 thoracic spine CT, particularly anteriorly. Aortic Atherosclerosis (ICD10-I70.0). Electronically Signed   By: Keith Rake M.D.   On: 02/21/2021 16:54   DG Chest Port 1 View  Result Date: 03/12/2021 CLINICAL DATA:  Acute respiratory failure EXAM: PORTABLE CHEST 1 VIEW COMPARISON:  Prior chest x-ray 03/06/2021 FINDINGS: A tracheostomy tube is now present. The tip is midline and at the level of the clavicles. Right upper extremity PICC in good position with the tip at the superior cavoatrial junction. Well-positioned gastric tube with the tip overlying the gastric fundus. Patient is status post median sternotomy with evidence of prior multivessel CABG. The cardiac and mediastinal contours are normal. Markedly decreased pulmonary vascular congestion and interstitial edema compared to prior. Chronic bronchitic changes and lingular atelectasis versus scarring. No large pleural effusion or pneumothorax. No acute osseous abnormality. IMPRESSION: 1. Interval resolution of pulmonary edema compared to 03/06/2021. 2. Interval placement of a tracheostomy tube which is in good position. 3. Other support apparatus remain in stable and satisfactory position. 4. Chronic bronchitic changes and lingular atelectasis versus scarring. Electronically Signed   By: Jacqulynn Cadet M.D.   On: 03/12/2021 06:51   Portable Chest x-ray  Result Date: 03/06/2021 CLINICAL DATA:  ETT placement EXAM: PORTABLE CHEST 1 VIEW COMPARISON:  03/04/2021 FINDINGS: Endotracheal tube with the tip 3.2 cm above the carina. Nasogastric tube coiled in  the stomach. Right subclavian central venous catheter with the tip projecting over the SVC. Bilateral interstitial and alveolar airspace opacities. Small left pleural effusion. No pneumothorax. Stable cardiomediastinal silhouette. No acute osseous abnormality. IMPRESSION: 1. Endotracheal tube with the tip 3.2 cm above the carina. 2. Nasogastric tube coiled in the stomach. 3. Bilateral interstitial and alveolar airspace opacities concerning for pulmonary edema. Electronically Signed   By: Kathreen Devoid   On: 03/06/2021 18:30   DG Chest Port 1 View  Result Date: 03/04/2021 CLINICAL DATA:  Check endotracheal tube placement EXAM: PORTABLE CHEST 1 VIEW COMPARISON:  Film from the previous day. FINDINGS: Cardiac shadow is stable. Postsurgical changes are again seen. Right-sided PICC line is noted in satisfactory position. Endotracheal tube and gastric catheter are seen in satisfactory position. Mild left basilar atelectasis and small effusion is seen. No other focal abnormality is noted. IMPRESSION: Tubes and lines as described in satisfactory position. Mild left basilar atelectasis with associated small effusion. Electronically Signed   By: Inez Catalina M.D.   On: 03/04/2021 03:44   DG Chest Port 1 View  Result Date: 03/03/2021 CLINICAL DATA:  Hypoxia EXAM: PORTABLE CHEST 1 VIEW COMPARISON:  03/02/2021 FINDINGS: Endotracheal tube terminates 5.3 cm above the carina. Mild patchy bilateral lower lobe opacities, likely atelectasis, with possible trace left pleural effusion. No frank interstitial edema. No pneumothorax. The heart is normal in size. Postsurgical changes related to prior CABG. Right subclavian venous catheter terminates at the cavoatrial junction. Enteric tube terminates in the proximal stomach. Median sternotomy. IMPRESSION: Mild patchy  bilateral lower lobe opacities, likely atelectasis, with possible trace left pleural effusion. No frank interstitial edema. Endotracheal tube terminates 5.3 cm above the  carina. Additional support apparatus as above. Electronically Signed   By: Julian Hy M.D.   On: 03/03/2021 05:57   Portable Chest xray  Result Date: 03/02/2021 CLINICAL DATA:  Acute respiratory failure with hypoxia EXAM: PORTABLE CHEST 1 VIEW COMPARISON:  Radiograph 03/01/2021 FINDINGS: Endotracheal tube tip terminates 4 cm from the carina. Transesophageal tube tip and side port distal to the GE junction within the gastric body. Postsurgical changes from sternotomy and CABG with stable cardiomediastinal contours including a calcified, tortuous aorta. Telemetry leads and support devices overlie the chest. Central vascular congestion with hazy interstitial opacities which could reflect combination of atelectasis and edema. Layering left effusion. No visible right effusion. No pneumothorax. No other acute osseous or soft tissue abnormality. IMPRESSION: 1. Lines and tubes as above. 2. Prior sternotomy and CABG. 3. Hazy opacities likely reflecting combination edema and atelectasis with layering left effusion. Electronically Signed   By: Lovena Le M.D.   On: 03/02/2021 04:28   DG Chest Port 1 View  Result Date: 03/01/2021 CLINICAL DATA:  ET tube and OG tube placement EXAM: PORTABLE CHEST 1 VIEW COMPARISON:  None. FINDINGS: The heart size and mediastinal contours are within normal limits. ETT is 1.1 cm above the carina. Overlying median sternotomy wires and surgical are present. There is prominence of the central pulmonary vasculature. No acute osseous abnormality. IMPRESSION: ETT is 1.1 cm above the carina. Mild pulmonary vascular congestion. Electronically Signed   By: Prudencio Pair M.D.   On: 03/01/2021 19:41   DG Abd Portable 2V  Result Date: 02/23/2021 CLINICAL DATA:  Abdominal pain, diverticulitis EXAM: PORTABLE ABDOMEN - 2 VIEW COMPARISON:  02/21/2021 CT abdomen/pelvis FINDINGS: Intact visualized lower sternotomy wires. No dilated small bowel loops. Large diffuse colonic stool volume. No  evidence pneumatosis or pneumoperitoneum. No radiopaque nephrolithiasis. IMPRESSION: Large diffuse colonic stool volume, suggesting constipation. Nonobstructive bowel gas pattern. No evidence of pneumoperitoneum. Electronically Signed   By: Ilona Sorrel M.D.   On: 02/23/2021 10:38   DG BE (COLON)W SINGLE CM (SOL OR THIN BA)  Result Date: 02/26/2021 CLINICAL DATA:  No bowel movements for than 1 week. EXAM: WATER SOLUBLE CONTRAST ENEMA TECHNIQUE: Initial scout AP supine abdominal image obtained to insure adequate colon cleansing. Dilute Gastrografin was introduced into the colon in a retrograde fashion and refluxed from the rectum to the cecum. FLUOROSCOPY TIME:  Fluoroscopy Time:  0.8 minute Radiation Exposure Index (if provided by the fluoroscopic device): 8.5 mGy Number of Acquired Spot Images: 0 COMPARISON:  None. FINDINGS: KUB: Small amount of oral contrast material within the colon. Large amount of stool in the sigmoid colon. No bowel dilatation to suggest obstruction. No evidence of pneumoperitoneum, portal venous gas or pneumatosis. No pathologic calcifications along the expected course of the ureters. No acute osseous abnormality. Single-contrast barium enema: An enema catheter was inserted and retention balloon distended under fluoroscopy. Dilute Gastrografin was then introduced into the colon. Contrast opacifies the rectosigmoid colon. Contrast could not be refluxed past the sigmoid colon. The patient was unable to retain the contrast after multiple attempts. IMPRESSION: Contrast opacifies the rectosigmoid colon. Contrast could not be refluxed past the sigmoid colon. The patient was unable to retain the contrast after multiple attempts. Electronically Signed   By: Kathreen Devoid   On: 02/26/2021 11:57   ECHOCARDIOGRAM COMPLETE  Result Date: 03/02/2021    ECHOCARDIOGRAM REPORT  Patient Name:   BRINKLEE CISSE Date of Exam: 03/02/2021 Medical Rec #:  [id number]      Height:       64.0 in Accession #:     [id number]     Weight:       148.1 lb Date of Birth:  03-18-1951     BSA:          1.722 m Patient Age:    15 years       BP:           Not listed in chart/Not listed in                                              chart mmHg Patient Gender: F              HR:           85 bpm. Exam Location:  [facility] Procedure: 2D Echo, Cardiac Doppler, Color Doppler and Strain Analysis Indications:     CHF-acute systolic K99.83  History:         Patient has no prior history of Echocardiogram examinations.                  Risk Factors:Hypertension.  Sonographer:     Sherrie Sport RDCS (AE) Referring Phys:  [id number] Nelle Don Diagnosing Phys: Ida Rogue MD  Sonographer Comments: Global longitudinal strain was attempted. IMPRESSIONS  1. Left ventricular ejection fraction, by estimation, is 50 to 55%. The left ventricle has low normal function. The left ventricle demonstrates regional wall motion abnormalities (Concern for basal to mid inferior wall hypokinesis). Left ventricular diastolic parameters are indeterminate. The average left ventricular global longitudinal strain is -11.3 %. The global longitudinal strain is abnormal.  2. Right ventricular systolic function is normal. The right ventricular size is normal.  3. Left atrial size was mildly dilated.  4. The mitral valve is normal in structure. Moderate mitral valve regurgitation.  5. Left pleural effusion noted, 7 cm FINDINGS  Left Ventricle: Left ventricular ejection fraction, by estimation, is 50 to 55%. The left ventricle has low normal function. The left ventricle demonstrates regional wall motion abnormalities. The average left ventricular global longitudinal strain is -11.3 %. The global longitudinal strain is abnormal. The left ventricular internal cavity size was normal in size. There is no left ventricular hypertrophy. Left ventricular diastolic parameters are indeterminate. Right Ventricle: The right ventricular size is normal. No increase in right ventricular  wall thickness. Right ventricular systolic function is normal. Left Atrium: Left atrial size was mildly dilated. Right Atrium: Right atrial size was normal in size. Pericardium: There is no evidence of pericardial effusion. Mitral Valve: The mitral valve is normal in structure. Moderate mitral valve regurgitation. No evidence of mitral valve stenosis. Tricuspid Valve: The tricuspid valve is normal in structure. Tricuspid valve regurgitation is mild . No evidence of tricuspid stenosis. Aortic Valve: The aortic valve is normal in structure. Aortic valve regurgitation is not visualized. Mild to moderate aortic valve sclerosis/calcification is present, without any evidence of aortic stenosis. Aortic valve mean gradient measures 3.0 mmHg. Aortic valve peak gradient measures 5.6 mmHg. Aortic valve area, by VTI measures 2.02 cm. Pulmonic Valve: The pulmonic valve was normal in structure. Pulmonic valve regurgitation is not visualized. No evidence of pulmonic stenosis. Aorta: The aortic root is normal in size and structure. Venous:  The inferior vena cava is normal in size with greater than 50% respiratory variability, suggesting right atrial pressure of 3 mmHg. IAS/Shunts: No atrial level shunt detected by color flow Doppler.  LEFT VENTRICLE PLAX 2D LVIDd:         4.78 cm  Diastology LVIDs:         3.43 cm  LV e' medial:    7.07 cm/s LV PW:         0.93 cm  LV E/e' medial:  11.8 LV IVS:        0.77 cm  LV e' lateral:   9.36 cm/s LVOT diam:     2.00 cm  LV E/e' lateral: 8.9 LV SV:         39 LV SV Index:   23       2D Longitudinal Strain LVOT Area:     3.14 cm 2D Strain GLS Avg:     -11.3 %                          3D Volume EF:                         3D EF:        61 %                         LV EDV:       175 ml                         LV ESV:       68 ml                         LV SV:        107 ml RIGHT VENTRICLE RV Basal diam:  2.92 cm LEFT ATRIUM           Index       RIGHT ATRIUM           Index LA diam:      4.30  cm 2.50 cm/m  RA Area:     12.70 cm LA Vol (A4C): 58.8 ml 34.14 ml/m RA Volume:   23.50 ml  13.65 ml/m  AORTIC VALVE                   PULMONIC VALVE AV Area (Vmax):    1.77 cm    PV Vmax:        0.64 m/s AV Area (Vmean):   1.68 cm    PV Peak grad:   1.6 mmHg AV Area (VTI):     2.02 cm    RVOT Peak grad: 1 mmHg AV Vmax:           118.00 cm/s AV Vmean:          77.250 cm/s AV VTI:            0.192 m AV Peak Grad:      5.6 mmHg AV Mean Grad:      3.0 mmHg LVOT Vmax:         66.60 cm/s LVOT Vmean:        41.300 cm/s LVOT VTI:          0.124 m LVOT/AV VTI ratio: 0.64  AORTA Ao Root diam: 3.10 cm MITRAL VALVE  TRICUSPID VALVE MV Area (PHT): 4.12 cm    TR Peak grad:   24.4 mmHg MV Decel Time: 184 msec    TR Vmax:        247.00 cm/s MV E velocity: 83.60 cm/s MV A velocity: 74.60 cm/s  SHUNTS MV E/A ratio:  1.12        Systemic VTI:  0.12 m                            Systemic Diam: 2.00 cm Ida Rogue MD Electronically signed by Ida Rogue MD Signature Date/Time: 03/02/2021/12:35:38 PM    Final    CT IMAGE GUIDED DRAINAGE BY PERCUTANEOUS CATHETER  Result Date: 02/28/2021 INDICATION: 70 year old female with a history of diverticular abscess. Known colovesical fistula. She presents for drainage of the abscess EXAM: CT GUIDED DRAINAGE OF  ABSCESS MEDICATIONS: The patient is currently admitted to the hospital and receiving intravenous antibiotics. The antibiotics were administered within an appropriate time frame prior to the initiation of the procedure. ANESTHESIA/SEDATION: 1.0 mg IV Versed 50 mcg IV Fentanyl Moderate Sedation Time:  11 minutes The patient was continuously monitored during the procedure by the interventional radiology nurse under my direct supervision. COMPLICATIONS: None TECHNIQUE: Informed written consent was obtained from the patient after a thorough discussion of the procedural risks, benefits and alternatives. All questions were addressed. Maximal Sterile Barrier Technique  was utilized including caps, mask, sterile gowns, sterile gloves, sterile drape, hand hygiene and skin antiseptic. A timeout was performed prior to the initiation of the procedure. PROCEDURE: The left lower quadrant was prepped with chlorhexidine in a sterile fashion, and a sterile drape was applied covering the operative field. A sterile gown and sterile gloves were used for the procedure. Local anesthesia was provided with 1% Lidocaine. Once the patient was prepped and draped and anesthetized with 1% lidocaine, CT guidance was used to place a 18 gauge trocar needle into the abscess of the left lower quadrant. Modified Seldinger technique was then used to place a 12 [place] drain. Approximately 100 cc of thin amber fluid aspirated for a culture. Drain was attached to bulb suction and sutured in position. Patient tolerated the procedure well and remained hemodynamically stable throughout. No complications were encountered and no significant blood loss. FINDINGS: Final image demonstrates pigtail drainage catheter in place within the abscess of the left lower quadrant. The only aspirate achieved was thin amber fluid. There is persisting flocculent material around the drain at the completion. This may represent frank contamination of the peritoneum with fecal material. Approximately 100 cc of fluid aspirated with a sample sent for culture. IMPRESSION: Status post CT-guided drainage of left lower quadrant abscess Signed, Dulcy Fanny. Dellia Nims, RPVI [facility] Electronically Signed   By: Corrie Mckusick D.O.   On: 02/28/2021 15:13   [place] EKG SITE RITE  Result Date: 03/02/2021 If Site Rite image not attached, placement could not be confirmed due to current cardiac rhythm.   Assessment & Plan    1.  SVT-has a history of atrial fibrillation.  Currently appears to be in sinus tachycardia.  Would continue with amiodarone 200 mg per tube.  Echo revealed EF 45 to 50%  globally.  2.  Cardiomyopathy-appears stable.  No obvious heart failure at present.  Failure to wean appears to be from a respiratory standpoint.  3.  Diverticulitis-being followed on broad-spectrum antibiotics.  Status post colectomy.  Signed, Javier Docker Carlyn Mullenbach  MD 03/17/2021, 11:32 AM  Pager: [phone]

## 2021-03-17 NOTE — Progress Notes (Signed)
[facility] for Electrolyte Monitoring and Replacement   Recent Labs: Potassium (mmol/L)  Date Value  03/17/2021 4.2   Magnesium (mg/dL)  Date Value  03/17/2021 2.0   Calcium (mg/dL)  Date Value  03/17/2021 8.1 (L)   Albumin (g/dL)  Date Value  03/16/2021 2.4 (L)   Phosphorus (mg/dL)  Date Value  03/17/2021 2.7   Sodium (mmol/L)  Date Value  03/17/2021 144   Corrected Ca: 9.3  Assessment: Desiree Madden is a 70 y.o. female admitted on 02/21/2021 with intra-abdominal infection (diverticulitis w/ fecal perforation). Patient septic with acute diverticulitis abdominal abscess with colovesical fistula and enterococcus urinary tract infection which was further complicated by the development of complicated diverticulitis with perforation and gangrenous changes requiring Hartman's procedure performed on 03/01/21. Pt developed severe septic shock after procedure requiring vasopressors. Additionally, pt has metabolic acidosis. PMH includes CAD s/p CABG (2015), HTN, hypothyroidism, colon bladder fistula, diverticulitis, and ulcerative colitis. Pharmacy has been consulted for electrolyte monitoring.  Patient was extubated and re-intubated 3/29, tracheostomy placed 3/31  Nutrition: Continous tube feeds @ 55 mL/hr and Prosource daily   Goal of Therapy:  K 4.0-5.1 Mg 2.0-2.4 Electrolytes WNL  Plan:  -- No additional electrolytes replacement at this time -- F/u electrolytes with AM labs  Oswald Hillock, PharmD 03/17/2021 8:18 AM

## 2021-03-17 NOTE — Progress Notes (Signed)
This RN advanced NGT a few centimeters and positioned patient to the R side per order of Dr. Patsey Berthold. This RN injected 53ml of air into NGT to confirm placement in stomach. Air heard upon auscultation of the abdominal region. Repeat Abdominal X-ray ordered to confirm NGT placement. Per Dr. Patsey Berthold, NGT okay to use.

## 2021-03-17 NOTE — Progress Notes (Signed)
03/17/21 0900  SLP Visit Information  SLP Received On 03/17/21  General Information  Behavior/Cognition Lethargic/Drowsy;Requires cueing  Patient Positioning Upright in bed  Oral care provided Yes  HPI Pt is a 70 y.o. female who admitted on 02/21/2021 w/ Diverticulitis with Colovesical fistula and UTI, now 12 Days Post-Op s/p Hartman's Procedure for complicated diverticulitis with gangrenous changes and perforation with feculent peritonitis and 4 days s/p colostomy revision.  Pt remained on vent failing wean; Tracheostomy placed on 03/08/2021.  Pt is now on Trach Collar O2 support weaning from full vent support on 03/15/2021.  Ongoing Wound Care and NG tube.  Feeding Other (Comment) (NG)  Treatment Provided  Treatment provided Passy Muir Speaking valve  Pain Assessment  Pain Assessment Faces  Faces Pain Scale 4  Pain Location with repositioning  Pain Descriptors / Indicators Grimacing  Pain Intervention(s) Limited activity within patient's tolerance  Cognitive-Linquistic Treatment  Skilled Treatment Patient seen for skilled ST intervention for PMSV trials. Pt's daughter present; patient lethargic, opens eye on command but not very interactive. Nurse tech assisted with repositioning, and SLP performed oral care to facilitate better alertness for participating in PMSV trials. On trach collar overnight, FiO2 28% 5L. At baseline, pt's Sp02 ranging 99-100, RR 21-25, HR 91-100. Cuff deflated at baseline; once this affirmed, SLP used finger occlusion to assess pt's ability to redirect air through upper airway. Pt able to throat clear, makes minimal attempts to phonate. PMSV was placed for 3 breath cycles; pt did not make attempts to phonate, and PMSV was removed without signs of air trapping; vitals remained stable. Replaced PMSV for 30 second trial periods, again with no signs of air trapping, change in vital signs, or signs of respiratory distress. Pt frowning and minimally participatory. She did  cough/clear throat on command but did not expectorate secretions. Did not make efforts to communicate despite encouragement from SLP, daughter. Pt nodded her head yes when asked if she wanted to stop. Nodded yes again when asked if she was tired. PMSV removed and left in room with precautions posted at bedside: to wear with SLP only at this time. Discussed this with RN and pt's daughter. Pt would benefit from PMSV trials when more alert and engaged. Will follow up Monday.  Tracheostomy Shiley Flexible 6 mm Cuffed  Placement Date/Time: 03/08/21 0759   Inserted prior to hospital arrival?: (c) Other (Comment)  Brand: Shiley Flexible  Size (mm): 6 mm  Style: Cuffed  Serial / Lot #: [id number]  Expiration Date: 11/17/25  Status Secured  Site Assessment Clean;Dry  Ties Assessment Clean;Dry  Cuff Pressure (cm H2O)  (0 cm/ deflated)  Tracheostomy Equipment at bedside Yes and checklist posted at head of bed  Additional Tracheostomy Tube Assessment  Fenestrated No  Trach Collar Period overnight  Secretion Description min (suctioned once last night per RN)  Frequency of Tracheal Suctioning once/shift  Level of Secretion Expectoration Tracheal  Ventilator Dependency  Vent Dependent No  FiO2 (%) 28 %  Cuff Deflation Trial  Tolerated Cuff Deflation Yes  Length of Time for Cuff Deflation Trial same as TC time  Behavior Listless;Poor eye contact  Cuff Deflation Trial - Comments at Baseline  PMSV Trial  PMSV was placed for 30 seconds  Able to redirect subglottic air through upper airway Yes  Able to Attain Phonation No (minimal attempts)  Voice Quality Aphonic  Able to Expectorate Secretions No (clears throat)  Level of Secretion Expectoration with PMSV Not observed  Breath Support for Phonation Moderately  decreased  Intelligibility Intelligibility reduced  Word 0-24% accurate  Respirations During Trial 22  SpO2 During Trial 100 %  Pulse During Trial 90  Behavior Lethargic;Poor eye  contact;Other (comment) (minimal attempts to communicate)  Phrase Not tested  Sentence Not tested  Conversation Not tested  SLP - End of Session  Patient left in bed;with call bell/phone within reach;with family/visitor present  Nurse Communication Cognitive/Linguistic strategies reviewed;PMSV use/precautions;Treatment plan  Assessment / Recommendations / Plan  Plan Continue with current plan of care  Dysphagia Recommendations  Diet recommendations NPO  Medication Administration Via alternative means  PMSV Recommendations  Patient may use Passy-Muir Speech Valve with SLP only  PMSV Supervision Full  MD: Please consider changing trach tube to  Cuffless  General Recommendations  Oral Care Recommendations Oral care QID  Follow up Recommendations Other (comment) (tbd)  SLP Visit Diagnosis Aphonia (R49.1) (tracheostomy)  Progression Toward Goals  Progression toward goals Progressing toward goals  Patient/Family Stated Goal none given  Potential to Achieve Goals (ACUTE ONLY) Good  Potential Considerations (ACUTE ONLY) Previous level of function;Cooperation/participation level;Co-morbidities;Severity of impairments  SLP Time Calculation  SLP Start Time (ACUTE ONLY) 0925  SLP Stop Time (ACUTE ONLY) 0945  SLP Time Calculation (min) (ACUTE ONLY) 20 min  SLP Evaluations  $ SLP Speech Visit 1 Visit  SLP Evaluations  $Speech Treatment for Individual 1 Procedure     Deneise Lever, MS, CCC-SLP Speech-Language Pathologist

## 2021-03-17 NOTE — Progress Notes (Signed)
This RN walked into the patient's room to find that the patient had dislodged NG tube. This RN stopped tube feeds and replaced NG tube. Mitts placed on patient. Dr. Patsey Berthold made aware. CXR order placed to confirm NG tube placement.

## 2021-03-18 ENCOUNTER — Inpatient Hospital Stay: Payer: Medicare HMO

## 2021-03-18 DIAGNOSIS — K5792 Diverticulitis of intestine, part unspecified, without perforation or abscess without bleeding: Secondary | ICD-10-CM | POA: Diagnosis not present

## 2021-03-18 LAB — PHOSPHORUS: Phosphorus: 3.5 mg/dL (ref 2.5–4.6)

## 2021-03-18 LAB — CBC WITH DIFFERENTIAL/PLATELET
Abs Immature Granulocytes: 0.04 10*3/uL (ref 0.00–0.07)
Basophils Absolute: 0 10*3/uL (ref 0.0–0.1)
Basophils Relative: 1 %
Eosinophils Absolute: 0.4 10*3/uL (ref 0.0–0.5)
Eosinophils Relative: 5 %
HCT: 30.4 % — ABNORMAL LOW (ref 36.0–46.0)
Hemoglobin: 9.8 g/dL — ABNORMAL LOW (ref 12.0–15.0)
Immature Granulocytes: 1 %
Lymphocytes Relative: 11 %
Lymphs Abs: 1 10*3/uL (ref 0.7–4.0)
MCH: 30.7 pg (ref 26.0–34.0)
MCHC: 32.2 g/dL (ref 30.0–36.0)
MCV: 95.3 fL (ref 80.0–100.0)
Monocytes Absolute: 0.5 10*3/uL (ref 0.1–1.0)
Monocytes Relative: 6 %
Neutro Abs: 6.6 10*3/uL (ref 1.7–7.7)
Neutrophils Relative %: 76 %
Platelets: 192 10*3/uL (ref 150–400)
RBC: 3.19 MIL/uL — ABNORMAL LOW (ref 3.87–5.11)
RDW: 17.7 % — ABNORMAL HIGH (ref 11.5–15.5)
WBC: 8.5 10*3/uL (ref 4.0–10.5)
nRBC: 0 % (ref 0.0–0.2)

## 2021-03-18 LAB — BLOOD GAS, ARTERIAL
Acid-base deficit: 1.8 mmol/L (ref 0.0–2.0)
Bicarbonate: 21.1 mmol/L (ref 20.0–28.0)
FIO2: 28
O2 Saturation: 98.9 %
Patient temperature: 37
pCO2 arterial: 29 mmHg — ABNORMAL LOW (ref 32.0–48.0)
pH, Arterial: 7.47 — ABNORMAL HIGH (ref 7.350–7.450)
pO2, Arterial: 121 mmHg — ABNORMAL HIGH (ref 83.0–108.0)

## 2021-03-18 LAB — ECHOCARDIOGRAM LIMITED
Height: 64.016 in
S' Lateral: 4.27 cm
Weight: 2370.39 oz

## 2021-03-18 LAB — GLUCOSE, CAPILLARY
Glucose-Capillary: 146 mg/dL — ABNORMAL HIGH (ref 70–99)
Glucose-Capillary: 138 mg/dL — ABNORMAL HIGH (ref 70–99)
Glucose-Capillary: 139 mg/dL — ABNORMAL HIGH (ref 70–99)
Glucose-Capillary: 150 mg/dL — ABNORMAL HIGH (ref 70–99)
Glucose-Capillary: 147 mg/dL — ABNORMAL HIGH (ref 70–99)

## 2021-03-18 LAB — BASIC METABOLIC PANEL
Anion gap: 5 (ref 5–15)
BUN: 50 mg/dL — ABNORMAL HIGH (ref 8–23)
CO2: 22 mmol/L (ref 22–32)
Calcium: 8.4 mg/dL — ABNORMAL LOW (ref 8.9–10.3)
Chloride: 117 mmol/L — ABNORMAL HIGH (ref 98–111)
Creatinine, Ser: 0.82 mg/dL (ref 0.44–1.00)
GFR, Estimated: >60 mL/min (ref >60)
Glucose, Bld: 156 mg/dL — ABNORMAL HIGH (ref 70–99)
Potassium: 3.7 mmol/L (ref 3.5–5.1)
Sodium: 144 mmol/L (ref 135–145)

## 2021-03-18 LAB — MAGNESIUM: Magnesium: 1.9 mg/dL (ref 1.7–2.4)

## 2021-03-18 MED ORDER — MAGNESIUM SULFATE 2 GM/50ML IV SOLN
2.0000 g | Freq: Once | INTRAVENOUS | Status: AC
  Administered 2021-03-18: 2 g via INTRAVENOUS
  Filled 2021-03-18: qty 50

## 2021-03-18 MED ORDER — ALPRAZOLAM 0.25 MG PO TABS
0.2500 mg | ORAL_TABLET | Freq: Two times a day (BID) | ORAL | Status: DC | PRN
  Administered 2021-03-21 – 2021-03-23 (×3): 0.25 mg
  Filled 2021-03-18 (×3): qty 1

## 2021-03-18 MED ORDER — POTASSIUM CHLORIDE 20 MEQ PO PACK
40.0000 meq | PACK | Freq: Once | ORAL | Status: AC
  Administered 2021-03-18: 40 meq
  Filled 2021-03-18: qty 2

## 2021-03-18 NOTE — Progress Notes (Signed)
Patient Name: Desiree Madden Date of Encounter: 03/18/2021  Hospital Problem List     Principal Problem:   Diverticulitis Active Problems:   Fistula, bladder   Ulcerative colitis ([place])   Hyperlipidemia   Essential hypertension   Hypothyroidism   Abnormal ECG   CKD (chronic kidney disease), stage III (HCC)   Elevated lactic acid level   Protein-calorie malnutrition, severe    Patient Profile      Patient is a 70 year old female admitted on March 16 of 2022 with diverticulitis.  She underwent colostomy and Hartman's procedure.  She was diagnosed with septic shock.  Has had prolonged intubation followed by trach placement.  Had a colostomy revision on 4 1.  Continue to fail weaning parameters.  Awaiting LTAC bed.  Has had episodes of wide-complex tachycardia.  Currently controlled with amiodarone 200 mg twice daily per tube.  Is on midodrine for soft blood pressure.  Is on broad-spectrum antibiotics..  Subjective   Minimally responsive.   Inpatient Medications    . amiodarone  200 mg Per Tube BID  . chlorhexidine gluconate (MEDLINE KIT)  15 mL Mouth Rinse BID  . Chlorhexidine Gluconate Cloth  6 each Topical Daily  . docusate  100 mg Per Tube BID  . enoxaparin (LOVENOX) injection  40 mg Subcutaneous Q24H  . free water  200 mL Per Tube Q4H  . insulin aspart  0-20 Units Subcutaneous Q4H  . levothyroxine  75 mcg Per Tube Q0600  . mouth rinse  15 mL Mouth Rinse 10 times per day  . midodrine  10 mg Per Tube TID WC  . multivitamin with minerals  1 tablet Per Tube Daily  . nutrition supplement (JUVEN)  1 packet Per Tube BID BM  . pantoprazole (PROTONIX) IV  40 mg Intravenous Daily  . polyethylene glycol  17 g Per Tube Daily  . sodium chloride flush  10-40 mL Intracatheter Q12H  . sodium chloride flush  5 mL Intracatheter Q8H    Vital Signs    Vitals:   03/18/21 0500 03/18/21 0600 03/18/21 0800 03/18/21 0900  BP: (!) 149/90 105/64 (!) 113/57 116/70  Pulse: (!) 121 (!) 105  (!) 103 (!) 101  Resp: 17 (!) 23 (!) 21 20  Temp:   98.1 F (36.7 C)   TempSrc:   Axillary   SpO2: 100% 100% 100% 100%  Weight:      Height:        Intake/Output Summary (Last 24 hours) at 03/18/2021 1143 Last data filed at 03/18/2021 9563 Gross per 24 hour  Intake --  Output 1380 ml  Net -1380 ml   Filed Weights   02/27/21 0900 02/28/21 1014 03/01/21 1511  Weight: 66.9 kg 67.2 kg 67.2 kg    Physical Exam    GEN: Chronically ill appearing.  HEENT: normal.  Neck: Supple, no JVD, carotid bruits, or masses. Cardiac: RRR,   Respiratory:  Respirations regular and unlabored, clear to auscultation bilaterally.  Neuro:  Lethargic with minimal interactive ability  Labs    CBC Recent Labs    03/17/21 0500 03/18/21 0627  WBC 8.4 8.5  NEUTROABS 6.3 6.6  HGB 10.0* 9.8*  HCT 30.7* 30.4*  MCV 94.8 95.3  PLT 204 875   Basic Metabolic Panel Recent Labs    03/17/21 0500 03/18/21 0627  NA 144 144  K 4.2 3.7  CL 116* 117*  CO2 22 22  GLUCOSE 145* 156*  BUN 43* 50*  CREATININE 0.71 0.82  CALCIUM 8.1* 8.4*  MG 2.0 1.9  PHOS 2.7 3.5   Liver Function Tests Recent Labs    03/16/21 0525  AST 112*  ALT 94*  ALKPHOS 86  BILITOT 0.5  PROT 5.7*  ALBUMIN 2.4*   No results for input(s): LIPASE, AMYLASE in the last 72 hours. Cardiac Enzymes No results for input(s): CKTOTAL, CKMB, CKMBINDEX, TROPONINI in the last 72 hours. BNP No results for input(s): BNP in the last 72 hours. D-Dimer No results for input(s): DDIMER in the last 72 hours. Hemoglobin A1C No results for input(s): HGBA1C in the last 72 hours. Fasting Lipid Panel No results for input(s): CHOL, HDL, LDLCALC, TRIG, CHOLHDL, LDLDIRECT in the last 72 hours. Thyroid Function Tests No results for input(s): TSH, T4TOTAL, T3FREE, THYROIDAB in the last 72 hours.  Invalid input(s): FREET3  Telemetry    nsr   ECG       Radiology    DG Abd 1 View  Result Date: 03/17/2021 CLINICAL DATA:  NG tube  placement. EXAM: ABDOMEN - 1 VIEW COMPARISON:  Earlier same day FINDINGS: NG tube is positioned in the proximal stomach with the proximal side port in the gastric fundus. Nonspecific bowel gas pattern evident. IMPRESSION: NG tube tip is in the stomach. Electronically Signed   By: Misty Stanley M.D.   On: 03/17/2021 17:51   DG Abd 1 View  Result Date: 03/17/2021 CLINICAL DATA:  Enteric tube placement. EXAM: ABDOMEN - 1 VIEW COMPARISON:  Abdominal x-ray dated March 08, 2021. FINDINGS: Enteric tube in the stomach with the tip at the fundus. The bowel gas pattern is normal. No radio-opaque calculi or other significant radiographic abnormality are seen. IMPRESSION: 1. Enteric tube in the stomach. Electronically Signed   By: Titus Dubin M.D.   On: 03/17/2021 14:17   DG Abd 1 View  Result Date: 03/08/2021 CLINICAL DATA:  NG tube placement. EXAM: ABDOMEN - 1 VIEW COMPARISON:  03/08/2021. FINDINGS: NG tube tip coiled in the stomach. No gastric or bowel distention. Ostomy noted over the left lower quadrant. Drainage catheter noted left lower quadrant. Prior CABG. IMPRESSION: NG tube noted with tip coiled in the stomach. Electronically Signed   By: Marcello Moores  Register   On: 03/08/2021 14:00   DG Abd 1 View  Result Date: 03/08/2021 CLINICAL DATA:  Nasogastric tube placement. EXAM: ABDOMEN - 1 VIEW COMPARISON:  None. FINDINGS: The bowel gas pattern is normal. Nasogastric tube tip is seen in proximal stomach. Colostomy is noted in left lower quadrant. No radio-opaque calculi or other significant radiographic abnormality are seen. IMPRESSION: Nasogastric tube tip seen in proximal stomach. No evidence of bowel obstruction or ileus. Electronically Signed   By: Marijo Conception M.D.   On: 03/08/2021 11:04   DG Abd 1 View  Result Date: 03/01/2021 CLINICAL DATA:  OG tube placement EXAM: ABDOMEN - 1 VIEW COMPARISON:  02/23/2021 FINDINGS: OG tube tip is in the stomach. Nonobstructive bowel gas pattern. No free air.  IMPRESSION: OG tube tip in the stomach. Electronically Signed   By: Rolm Baptise M.D.   On: 03/01/2021 19:27   CT ABDOMEN PELVIS W CONTRAST  Result Date: 02/28/2021 CLINICAL DATA:  Left lower quadrant abdominal pain. History diverticulitis. EXAM: CT ABDOMEN AND PELVIS WITH CONTRAST TECHNIQUE: Multidetector CT imaging of the abdomen and pelvis was performed using the standard protocol following bolus administration of intravenous contrast. CONTRAST:  68m OMNIPAQUE IOHEXOL 300 MG/ML  SOLN COMPARISON:  CT scan 02/24/2021 FINDINGS: Lower chest: Margins persistent small bilateral pleural effusions with overlying  atelectasis. The heart is normal in size. No pericardial effusion. Stable aortic and advanced coronary artery calcifications. Surgical changes from coronary artery bypass surgery. The distal esophagus is grossly normal. Hepatobiliary: No biliary no hepatic lesions or intrahepatic biliary dilatation. The gallbladder is unremarkable. Persistent perihepatic fluid. No common bile duct dilatation. Pancreas: No mass, inflammation or ductal dilatation. Spleen: Normal size.  No focal lesions. Adrenals/Urinary Tract: The adrenal glands are normal. No renal lesions or hydroureteronephrosis. There is a large amount of gas in the bladder most likely from recent catheterization. Stomach/Bowel: The stomach, duodenum and small bowel are unremarkable. No acute inflammatory process or obstructive findings. Contrast gets all the way to the colon. Diffuse scattered colonic diverticulosis. Areas of colonic inflammation noted involving the descending colon and almost the entire sigmoid colon with marked wall thickening and submucosal edema. There is a new pericolonic abscess with a possible intramural component but definite extramural component in the left pelvis measuring a maximum of 8.3 x 4.7 cm. It appears to contain a small amount of dependent contrast material suggesting active extravasation. Vascular/Lymphatic: Stable  advanced atherosclerotic calcifications involving the aorta and iliac arteries. No aneurysm. The major venous structures are patent. There are numerous scattered borderline retroperitoneal lymph nodes which are likely inflammatory/reactive. Reproductive: The uterus and ovaries are grossly normal in stable. Other: Diffuse subcutaneous soft tissue swelling/edema suggesting anasarca. There is also scattered free abdominal and free pelvic fluid. Musculoskeletal: If no significant bony findings. IMPRESSION: 1. Persistent changes of acute severe diverticulitis involving the descending colon and almost the entire sigmoid colon. 2. New pericolonic abscess measuring a maximum of 8.3 x 4.7 cm. It appears to contain a small amount of dependent contrast material suggesting active extravasation. 3. Persistent small bilateral pleural effusions with overlying atelectasis. 4. Persistent perihepatic and free pelvic fluid. 5. Diffuse subcutaneous soft tissue swelling/edema suggesting anasarca. 6. Stable advanced atherosclerotic calcifications involving the aorta and iliac arteries. 7. Large amount of gas in the bladder most likely from recent catheterization. 8. Aortic atherosclerosis. These results will be called to the ordering clinician or representative by the Radiologist Assistant, and communication documented in the PACS or [employer]. Aortic Atherosclerosis (ICD10-I70.0). Electronically Signed   By: Marijo Sanes M.D.   On: 02/28/2021 13:43   CT ABDOMEN PELVIS W CONTRAST  Result Date: 02/24/2021 CLINICAL DATA:  Lower abdominal and pelvic pain EXAM: CT ABDOMEN AND PELVIS WITH CONTRAST TECHNIQUE: Multidetector CT imaging of the abdomen and pelvis was performed using the standard protocol following bolus administration of intravenous contrast. CONTRAST:  56m OMNIPAQUE IOHEXOL 300 MG/ML  SOLN COMPARISON:  02/21/2021 FINDINGS: Lower chest: New atelectasis in the lower lobes and lingula. Trace bilateral pleural effusions  are likewise new. Prior CABG. Coronary and aortic atherosclerosis. Contrast medium in the distal esophagus suggest dysmotility or reflux. Hepatobiliary: Borderline distended gallbladder. No biliary dilatation. No focal hepatic parenchymal lesion identified. Pancreas: Unremarkable Spleen: Unremarkable Adrenals/Urinary Tract: Both adrenal glands appear within normal limits. Borderline right hydroureter extending to the iliac vessel cross over with the ureters obscured by surrounding inflammatory findings. No hydronephrosis or delay in excretion of contrast medium. Small amount of left lateral perinephric edema. Stomach/Bowel: Normal appendix. Descending and sigmoid colon diverticulosis with notably prominent caliber of the sigmoid colon, considerable paracolic stranding around the distal descending and sigmoid colon, and substantial localized wall thickening along a 9 cm segment of the distal sigmoid colon for example on image 70 of series 2 suspicious for progressive diverticulitis and likely some superimposed regional colitis. Air-fluid  level in the rectum. Peripheral intermediate density in the cecum for example on image 51 of series 2 simulates wall thickening, but there is no wall thickening in this region on 02/21/2021 hence this appearance is due to isodense fluid rather than actual cecal wall thickening. Vascular/Lymphatic: Aortoiliac atherosclerotic vascular disease. Scattered likely reactive retroperitoneal lymph nodes including an index left periaortic node measuring 0.8 cm in short axis on image 48 series 2. Reproductive: Unremarkable Other: New perihepatic and perisplenic ascites along with fluid tracking along the paracolic gutter. New pelvic ascites. No free intraperitoneal gas is identified. I do not observe a discrete drainable abscess. Musculoskeletal: Unchanged compression fracture at T11. Lower lumbar spondylosis and degenerative disc disease. IMPRESSION: 1. Worsened prominent paracolic stranding  around the distal descending and sigmoid colon, and substantial localized wall thickening along a 9 cm segment of the distal sigmoid colon suspicious for progressive diverticulitis and likely some superimposed regional colitis. No free intraperitoneal gas or discrete drainable abscess identified. 2. New perihepatic and perisplenic ascites along with fluid tracking along the paracolic gutters and pelvic ascites. 3. Trace bilateral pleural effusions with new atelectasis in the lower lobes and lingula. 4. Contrast medium in the distal esophagus suggest dysmotility or reflux. 5. Unchanged compression fracture at T11. 6. Borderline distended gallbladder. 7. Borderline right hydroureter extending to the iliac vessel cross over with the ureters obscured by surrounding inflammatory findings. No hydronephrosis or delay in excretion of contrast medium. 8. Aortic atherosclerosis. Aortic Atherosclerosis (ICD10-I70.0). Electronically Signed   By: Van Clines M.D.   On: 02/24/2021 13:58   CT ABDOMEN PELVIS W CONTRAST  Result Date: 02/21/2021 CLINICAL DATA:  Lower abdominal pain. History of diverticulitis. 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 EXAM: CT ABDOMEN AND PELVIS WITH CONTRAST TECHNIQUE: Multidetector CT imaging of the abdomen and pelvis was performed using the standard protocol following bolus administration of intravenous contrast. CONTRAST:  1m OMNIPAQUE IOHEXOL 300 MG/ML  SOLN COMPARISON:  None. FINDINGS: Lower chest: No basilar consolidation or pleural effusion. Minimal posterior pleural thickening on the left. Coronary artery calcifications. Epicardial leads in place. Hepatobiliary: Minimal focal fatty infiltration adjacent to the gallbladder fossa. No suspicious hepatic lesion. No calcified gallstone or pericholecystic fat stranding. No biliary dilatation. Pancreas: No ductal dilatation or inflammation. Spleen: Normal in size without focal abnormality. Adrenals/Urinary  Tract: Normal adrenal glands. Lobulated bilateral renal contours. Slight atrophy of left kidney. No hydronephrosis. No perinephric edema. Tiny cyst in the upper left kidney. No visualized renal stone. Symmetric excretion on delayed phase imaging. The urinary bladder is displaced into the left pelvis. There is a in bladder or fluid level. No perivesicular fat stranding. No definite bladder wall thickening. Loss of fat plane between the superior aspect of the urinary bladder and a sigmoid colonic diverticulum. Stomach/Bowel: Pericolonic fat stranding about the proximal sigmoid colon, series 2, image 62, in the region of multiple colonic diverticula suspicious for diverticulitis. This fat stranding extends to the adjacent urinary bladder. There are numerable colonic diverticula from the splenic flexure distally. Large volume of colonic stool from the splenic flexure distally. Mild sigmoid colonic wall thickening which is nonspecific. There are scattered right colonic diverticula. Transverse colon is tortuous. Small hiatal hernia. Stomach is decompressed. No small bowel obstruction or inflammation. Normal air-filled appendix. Vascular/Lymphatic: Moderately advanced aortic atherosclerosis. Irregular plaque in the upper abdominal aorta. Incidental 2 right and left renal arteries. No aneurysm. Calcified plaque throughout the left common iliac artery causes some degree of stenosis. Patent portal vein. No  portal or mesenteric venous gas. Multiple small retroperitoneal lymph nodes, typically reactive. No bulky abdominopelvic adenopathy. Reproductive: Uterus remains in situ, atrophic and deviated to the left. No adnexal mass. Other: No free air or focal fluid collection. Minimal free fluid in the left pelvis. No abdominal wall hernia. Musculoskeletal: Chronic but progressive moderately severe T11 compression fracture. Increase vertebral body height loss since 11/23/2019 thoracic spine CT, particularly anteriorly. Hemi  transitional lumbosacral anatomy with enlarged right transverse process and pseudoarticulation with the sacrum. Bones are subjectively under mineralized. IMPRESSION: 1. Advanced colonic diverticulosis with pericolonic fat stranding about the proximal sigmoid colon, in the region of multiple colonic diverticula, suspicious for acute diverticulitis. This fat stranding extends to the adjacent urinary bladder. 2. Fluid level in the urinary bladder. This may be due to Foley catheter/instrumentation, or fistula as provided history. Pericolonic edema extends to the anterior aspect of the bladder. 3. Large volume of colonic stool from the splenic flexure distally, suggesting constipation. There is wall thickening in the mid sigmoid colon that is nonspecific. Recommend up-to-date colonoscopy to exclude the possibility of colonic neoplasm. 4. Chronic but progressive moderately severe T11 compression fracture. Increase vertebral body height loss since 11/23/2019 thoracic spine CT, particularly anteriorly. Aortic Atherosclerosis (ICD10-I70.0). Electronically Signed   By: Keith Rake M.D.   On: 02/21/2021 16:54   DG Chest Port 1 View  Result Date: 03/18/2021 CLINICAL DATA:  Sepsis EXAM: PORTABLE CHEST 1 VIEW COMPARISON:  March 12, 2021 FINDINGS: Tracheostomy catheter tip is 5.4 cm above the carina. Central catheter tip in right atrium slightly distal to the cavoatrial junction. Nasogastric tube tip and side port in stomach. No pneumothorax. There is atelectatic change in the right base. Lungs elsewhere are clear. The heart size and pulmonary vascular normal. Patient is status post coronary artery bypass grafting. No bone lesions. IMPRESSION: Tube and catheter positions as described without pneumothorax. Right base atelectasis. Lungs otherwise clear. Stable cardiac silhouette. Electronically Signed   By: Lowella Grip III M.D.   On: 03/18/2021 10:43   DG Chest Port 1 View  Result Date: 03/12/2021 CLINICAL DATA:   Acute respiratory failure EXAM: PORTABLE CHEST 1 VIEW COMPARISON:  Prior chest x-ray 03/06/2021 FINDINGS: A tracheostomy tube is now present. The tip is midline and at the level of the clavicles. Right upper extremity PICC in good position with the tip at the superior cavoatrial junction. Well-positioned gastric tube with the tip overlying the gastric fundus. Patient is status post median sternotomy with evidence of prior multivessel CABG. The cardiac and mediastinal contours are normal. Markedly decreased pulmonary vascular congestion and interstitial edema compared to prior. Chronic bronchitic changes and lingular atelectasis versus scarring. No large pleural effusion or pneumothorax. No acute osseous abnormality. IMPRESSION: 1. Interval resolution of pulmonary edema compared to 03/06/2021. 2. Interval placement of a tracheostomy tube which is in good position. 3. Other support apparatus remain in stable and satisfactory position. 4. Chronic bronchitic changes and lingular atelectasis versus scarring. Electronically Signed   By: Jacqulynn Cadet M.D.   On: 03/12/2021 06:51   Portable Chest x-ray  Result Date: 03/06/2021 CLINICAL DATA:  ETT placement EXAM: PORTABLE CHEST 1 VIEW COMPARISON:  03/04/2021 FINDINGS: Endotracheal tube with the tip 3.2 cm above the carina. Nasogastric tube coiled in the stomach. Right subclavian central venous catheter with the tip projecting over the SVC. Bilateral interstitial and alveolar airspace opacities. Small left pleural effusion. No pneumothorax. Stable cardiomediastinal silhouette. No acute osseous abnormality. IMPRESSION: 1. Endotracheal tube with the tip 3.2 cm  above the carina. 2. Nasogastric tube coiled in the stomach. 3. Bilateral interstitial and alveolar airspace opacities concerning for pulmonary edema. Electronically Signed   By: Kathreen Devoid   On: 03/06/2021 18:30   DG Chest Port 1 View  Result Date: 03/04/2021 CLINICAL DATA:  Check endotracheal tube  placement EXAM: PORTABLE CHEST 1 VIEW COMPARISON:  Film from the previous day. FINDINGS: Cardiac shadow is stable. Postsurgical changes are again seen. Right-sided PICC line is noted in satisfactory position. Endotracheal tube and gastric catheter are seen in satisfactory position. Mild left basilar atelectasis and small effusion is seen. No other focal abnormality is noted. IMPRESSION: Tubes and lines as described in satisfactory position. Mild left basilar atelectasis with associated small effusion. Electronically Signed   By: Inez Catalina M.D.   On: 03/04/2021 03:44   DG Chest Port 1 View  Result Date: 03/03/2021 CLINICAL DATA:  Hypoxia EXAM: PORTABLE CHEST 1 VIEW COMPARISON:  03/02/2021 FINDINGS: Endotracheal tube terminates 5.3 cm above the carina. Mild patchy bilateral lower lobe opacities, likely atelectasis, with possible trace left pleural effusion. No frank interstitial edema. No pneumothorax. The heart is normal in size. Postsurgical changes related to prior CABG. Right subclavian venous catheter terminates at the cavoatrial junction. Enteric tube terminates in the proximal stomach. Median sternotomy. IMPRESSION: Mild patchy bilateral lower lobe opacities, likely atelectasis, with possible trace left pleural effusion. No frank interstitial edema. Endotracheal tube terminates 5.3 cm above the carina. Additional support apparatus as above. Electronically Signed   By: Julian Hy M.D.   On: 03/03/2021 05:57   Portable Chest xray  Result Date: 03/02/2021 CLINICAL DATA:  Acute respiratory failure with hypoxia EXAM: PORTABLE CHEST 1 VIEW COMPARISON:  Radiograph 03/01/2021 FINDINGS: Endotracheal tube tip terminates 4 cm from the carina. Transesophageal tube tip and side port distal to the GE junction within the gastric body. Postsurgical changes from sternotomy and CABG with stable cardiomediastinal contours including a calcified, tortuous aorta. Telemetry leads and support devices overlie the  chest. Central vascular congestion with hazy interstitial opacities which could reflect combination of atelectasis and edema. Layering left effusion. No visible right effusion. No pneumothorax. No other acute osseous or soft tissue abnormality. IMPRESSION: 1. Lines and tubes as above. 2. Prior sternotomy and CABG. 3. Hazy opacities likely reflecting combination edema and atelectasis with layering left effusion. Electronically Signed   By: Lovena Le M.D.   On: 03/02/2021 04:28   DG Chest Port 1 View  Result Date: 03/01/2021 CLINICAL DATA:  ET tube and OG tube placement EXAM: PORTABLE CHEST 1 VIEW COMPARISON:  None. FINDINGS: The heart size and mediastinal contours are within normal limits. ETT is 1.1 cm above the carina. Overlying median sternotomy wires and surgical are present. There is prominence of the central pulmonary vasculature. No acute osseous abnormality. IMPRESSION: ETT is 1.1 cm above the carina. Mild pulmonary vascular congestion. Electronically Signed   By: Prudencio Pair M.D.   On: 03/01/2021 19:41   DG Abd Portable 2V  Result Date: 02/23/2021 CLINICAL DATA:  Abdominal pain, diverticulitis EXAM: PORTABLE ABDOMEN - 2 VIEW COMPARISON:  02/21/2021 CT abdomen/pelvis FINDINGS: Intact visualized lower sternotomy wires. No dilated small bowel loops. Large diffuse colonic stool volume. No evidence pneumatosis or pneumoperitoneum. No radiopaque nephrolithiasis. IMPRESSION: Large diffuse colonic stool volume, suggesting constipation. Nonobstructive bowel gas pattern. No evidence of pneumoperitoneum. Electronically Signed   By: Ilona Sorrel M.D.   On: 02/23/2021 10:38   DG BE (COLON)W SINGLE CM (SOL OR THIN BA)  Result Date:  02/26/2021 CLINICAL DATA:  No bowel movements for than 1 week. EXAM: WATER SOLUBLE CONTRAST ENEMA TECHNIQUE: Initial scout AP supine abdominal image obtained to insure adequate colon cleansing. Dilute Gastrografin was introduced into the colon in a retrograde fashion and  refluxed from the rectum to the cecum. FLUOROSCOPY TIME:  Fluoroscopy Time:  0.8 minute Radiation Exposure Index (if provided by the fluoroscopic device): 8.5 mGy Number of Acquired Spot Images: 0 COMPARISON:  None. FINDINGS: KUB: Small amount of oral contrast material within the colon. Large amount of stool in the sigmoid colon. No bowel dilatation to suggest obstruction. No evidence of pneumoperitoneum, portal venous gas or pneumatosis. No pathologic calcifications along the expected course of the ureters. No acute osseous abnormality. Single-contrast barium enema: An enema catheter was inserted and retention balloon distended under fluoroscopy. Dilute Gastrografin was then introduced into the colon. Contrast opacifies the rectosigmoid colon. Contrast could not be refluxed past the sigmoid colon. The patient was unable to retain the contrast after multiple attempts. IMPRESSION: Contrast opacifies the rectosigmoid colon. Contrast could not be refluxed past the sigmoid colon. The patient was unable to retain the contrast after multiple attempts. Electronically Signed   By: Kathreen Devoid   On: 02/26/2021 11:57   ECHOCARDIOGRAM COMPLETE  Result Date: 03/02/2021    ECHOCARDIOGRAM REPORT   Patient Name:   JACAYLA NORDELL Date of Exam: 03/02/2021 Medical Rec #:  [id number]      Height:       64.0 in Accession #:    [id number]     Weight:       148.1 lb Date of Birth:  Aug 17, 1951     BSA:          1.722 m Patient Age:    52 years       BP:           Not listed in chart/Not listed in                                              chart mmHg Patient Gender: F              HR:           85 bpm. Exam Location:  [facility] Procedure: 2D Echo, Cardiac Doppler, Color Doppler and Strain Analysis Indications:     CHF-acute systolic M38.46  History:         Patient has no prior history of Echocardiogram examinations.                  Risk Factors:Hypertension.  Sonographer:     Sherrie Sport RDCS (AE) Referring Phys:  [id number] Nelle Don  Diagnosing Phys: Ida Rogue MD  Sonographer Comments: Global longitudinal strain was attempted. IMPRESSIONS  1. Left ventricular ejection fraction, by estimation, is 50 to 55%. The left ventricle has low normal function. The left ventricle demonstrates regional wall motion abnormalities (Concern for basal to mid inferior wall hypokinesis). Left ventricular diastolic parameters are indeterminate. The average left ventricular global longitudinal strain is -11.3 %. The global longitudinal strain is abnormal.  2. Right ventricular systolic function is normal. The right ventricular size is normal.  3. Left atrial size was mildly dilated.  4. The mitral valve is normal in structure. Moderate mitral valve regurgitation.  5. Left pleural effusion noted, 7 cm FINDINGS  Left Ventricle: Left ventricular ejection fraction, by  estimation, is 50 to 55%. The left ventricle has low normal function. The left ventricle demonstrates regional wall motion abnormalities. The average left ventricular global longitudinal strain is -11.3 %. The global longitudinal strain is abnormal. The left ventricular internal cavity size was normal in size. There is no left ventricular hypertrophy. Left ventricular diastolic parameters are indeterminate. Right Ventricle: The right ventricular size is normal. No increase in right ventricular wall thickness. Right ventricular systolic function is normal. Left Atrium: Left atrial size was mildly dilated. Right Atrium: Right atrial size was normal in size. Pericardium: There is no evidence of pericardial effusion. Mitral Valve: The mitral valve is normal in structure. Moderate mitral valve regurgitation. No evidence of mitral valve stenosis. Tricuspid Valve: The tricuspid valve is normal in structure. Tricuspid valve regurgitation is mild . No evidence of tricuspid stenosis. Aortic Valve: The aortic valve is normal in structure. Aortic valve regurgitation is not visualized. Mild to moderate aortic valve  sclerosis/calcification is present, without any evidence of aortic stenosis. Aortic valve mean gradient measures 3.0 mmHg. Aortic valve peak gradient measures 5.6 mmHg. Aortic valve area, by VTI measures 2.02 cm. Pulmonic Valve: The pulmonic valve was normal in structure. Pulmonic valve regurgitation is not visualized. No evidence of pulmonic stenosis. Aorta: The aortic root is normal in size and structure. Venous: The inferior vena cava is normal in size with greater than 50% respiratory variability, suggesting right atrial pressure of 3 mmHg. IAS/Shunts: No atrial level shunt detected by color flow Doppler.  LEFT VENTRICLE PLAX 2D LVIDd:         4.78 cm  Diastology LVIDs:         3.43 cm  LV e' medial:    7.07 cm/s LV PW:         0.93 cm  LV E/e' medial:  11.8 LV IVS:        0.77 cm  LV e' lateral:   9.36 cm/s LVOT diam:     2.00 cm  LV E/e' lateral: 8.9 LV SV:         39 LV SV Index:   23       2D Longitudinal Strain LVOT Area:     3.14 cm 2D Strain GLS Avg:     -11.3 %                          3D Volume EF:                         3D EF:        61 %                         LV EDV:       175 ml                         LV ESV:       68 ml                         LV SV:        107 ml RIGHT VENTRICLE RV Basal diam:  2.92 cm LEFT ATRIUM           Index       RIGHT ATRIUM           Index LA diam:  4.30 cm 2.50 cm/m  RA Area:     12.70 cm LA Vol (A4C): 58.8 ml 34.14 ml/m RA Volume:   23.50 ml  13.65 ml/m  AORTIC VALVE                   PULMONIC VALVE AV Area (Vmax):    1.77 cm    PV Vmax:        0.64 m/s AV Area (Vmean):   1.68 cm    PV Peak grad:   1.6 mmHg AV Area (VTI):     2.02 cm    RVOT Peak grad: 1 mmHg AV Vmax:           118.00 cm/s AV Vmean:          77.250 cm/s AV VTI:            0.192 m AV Peak Grad:      5.6 mmHg AV Mean Grad:      3.0 mmHg LVOT Vmax:         66.60 cm/s LVOT Vmean:        41.300 cm/s LVOT VTI:          0.124 m LVOT/AV VTI ratio: 0.64  AORTA Ao Root diam: 3.10 cm MITRAL VALVE                TRICUSPID VALVE MV Area (PHT): 4.12 cm    TR Peak grad:   24.4 mmHg MV Decel Time: 184 msec    TR Vmax:        247.00 cm/s MV E velocity: 83.60 cm/s MV A velocity: 74.60 cm/s  SHUNTS MV E/A ratio:  1.12        Systemic VTI:  0.12 m                            Systemic Diam: 2.00 cm Ida Rogue MD Electronically signed by Ida Rogue MD Signature Date/Time: 03/02/2021/12:35:38 PM    Final    CT IMAGE GUIDED DRAINAGE BY PERCUTANEOUS CATHETER  Result Date: 02/28/2021 INDICATION: 70 year old female with a history of diverticular abscess. Known colovesical fistula. She presents for drainage of the abscess EXAM: CT GUIDED DRAINAGE OF  ABSCESS MEDICATIONS: The patient is currently admitted to the hospital and receiving intravenous antibiotics. The antibiotics were administered within an appropriate time frame prior to the initiation of the procedure. ANESTHESIA/SEDATION: 1.0 mg IV Versed 50 mcg IV Fentanyl Moderate Sedation Time:  11 minutes The patient was continuously monitored during the procedure by the interventional radiology nurse under my direct supervision. COMPLICATIONS: None TECHNIQUE: Informed written consent was obtained from the patient after a thorough discussion of the procedural risks, benefits and alternatives. All questions were addressed. Maximal Sterile Barrier Technique was utilized including caps, mask, sterile gowns, sterile gloves, sterile drape, hand hygiene and skin antiseptic. A timeout was performed prior to the initiation of the procedure. PROCEDURE: The left lower quadrant was prepped with chlorhexidine in a sterile fashion, and a sterile drape was applied covering the operative field. A sterile gown and sterile gloves were used for the procedure. Local anesthesia was provided with 1% Lidocaine. Once the patient was prepped and draped and anesthetized with 1% lidocaine, CT guidance was used to place a 18 gauge trocar needle into the abscess of the left lower quadrant.  Modified Seldinger technique was then used to place a 12 [place] drain. Approximately 100 cc of thin amber fluid aspirated for a  culture. Drain was attached to bulb suction and sutured in position. Patient tolerated the procedure well and remained hemodynamically stable throughout. No complications were encountered and no significant blood loss. FINDINGS: Final image demonstrates pigtail drainage catheter in place within the abscess of the left lower quadrant. The only aspirate achieved was thin amber fluid. There is persisting flocculent material around the drain at the completion. This may represent frank contamination of the peritoneum with fecal material. Approximately 100 cc of fluid aspirated with a sample sent for culture. IMPRESSION: Status post CT-guided drainage of left lower quadrant abscess Signed, Dulcy Fanny. Dellia Nims, RPVI [facility] Electronically Signed   By: Corrie Mckusick D.O.   On: 02/28/2021 15:13   ECHOCARDIOGRAM LIMITED  Result Date: 03/18/2021    ECHOCARDIOGRAM LIMITED REPORT   Patient Name:   ABBIE BERLING Date of Exam: 03/16/2021 Medical Rec #:  [id number]      Height:       64.0 in Accession #:    [id number]     Weight:       148.1 lb Date of Birth:  1951-07-05     BSA:          1.722 m Patient Age:    9 years       BP: Patient Gender: F              HR: Exam Location:  [facility] Procedure: 2D Echo Indications:     R06.02 SOB  History:         CHF and Cardiomyopathy.  Sonographer:     JERRY Referring Phys:  [id number] DWAYNE D CALLWOOD Diagnosing Phys: Bartholome Bill MD IMPRESSIONS  1. Left ventricular ejection fraction, by estimation, is 20 to 25%. The left ventricle has severely decreased function. The left ventricular internal cavity size was mildly dilated. There is moderate left ventricular hypertrophy. Left ventricular diastolic parameters are consistent with Grade I diastolic dysfunction (impaired relaxation).  2. Left atrial size was  mildly dilated.  3. Right atrial size was mildly dilated.  4. The mitral valve is grossly normal. Mild mitral valve regurgitation.  5. The aortic valve was not well visualized. Aortic valve regurgitation is trivial. FINDINGS  Left Ventricle: Left ventricular ejection fraction, by estimation, is 20 to 25%. The left ventricle has severely decreased function. The left ventricular internal cavity size was mildly dilated. There is moderate left ventricular hypertrophy. Left ventricular diastolic parameters are consistent with Grade I diastolic dysfunction (impaired relaxation). Left Atrium: Left atrial size was mildly dilated. Right Atrium: Right atrial size was mildly dilated. Pericardium: There is no evidence of pericardial effusion. Mitral Valve: The mitral valve is grossly normal. Mild mitral valve regurgitation. Tricuspid Valve: The tricuspid valve is not well visualized. Tricuspid valve regurgitation is trivial. Aortic Valve: The aortic valve was not well visualized. Aortic valve regurgitation is trivial. Pulmonic Valve: The pulmonic valve was not assessed. Aorta: The aortic root is normal in size and structure. IAS/Shunts: The interatrial septum was not assessed. LEFT VENTRICLE PLAX 2D LVIDd:         4.50 cm LVIDs:         4.27 cm LV PW:         1.28 cm LV IVS:        1.23 cm  LEFT ATRIUM         Index LA diam:    4.10 cm 2.38 cm/m   AORTA Ao Root diam: 3.10 cm Bartholome Bill MD Electronically signed  by Bartholome Bill MD Signature Date/Time: 03/18/2021/11:29:01 AM    Final    [place] EKG SITE RITE  Result Date: 03/02/2021 If Site Rite image not attached, placement could not be confirmed due to current cardiac rhythm.   Assessment & Plan      1.  SVT-has a history of atrial fibrillation.  Currently appears to be in sinus tachycardia.  Would continue with amiodarone 200 mg per tube.  Echo revealed EF 45 to 50% globally. WIll follow  2.  Cardiomyopathy-appears stable.  No obvious heart failure at present.   Failure to wean appears to be from a respiratory standpoint.  3.  Diverticulitis-being followed on broad-spectrum antibiotics.  Status post colectomy.   Signed, Javier Docker Delmos Velaquez MD 03/18/2021, 11:43 AM  Pager: [phone]

## 2021-03-18 NOTE — Progress Notes (Signed)
PROGRESS NOTE    Desiree Madden  NWG:[id number] DOB: 09/30/1951 DOA: 02/21/2021 PCP: [place]     Brief Narrative:  Desiree Madden is a 70 yo female who was admitted on 02/21/21 with chief complaint of abdominal pain. He has medical history significant for colon-bladder fistula, diverticulitis, HTN, hypothyroidism, HLD, UC. On 3/24, she developed complicated diverticulitis with perforation and underwent sigmoid colectomy with end colostomy and Hartman's procedure.  Postoperatively, patient was admitted to the ICU, remained intubated, required multiple vasopressors due to septic shock.  Significant hospital events listed below:  3/16/2022Admitted to Med/Surg unit with Diverticulitis and UTI 3/23 LLQ abdominal drain placed by IR to pelvic abscess 3/24Worsening Leukocytosis despite drain placement. Found to have complicated Diverticulitis with perforation requiring Sigmoid Colectomy with end colostomy and Hartman's Procedure. Post procedure she returns to ICU and remains intubated with Septic Shock requiring multiple vasopressors. Left Femoral CVC and Arterial lines placed emergently 3/27Severe anasarca, albumin 1.8 DX of NSTEMI 3/28 Failure to wean from vent 3/29 Severe agitation, septic shock on pressors 3/29 Failed extubation, re-intubated  3/30 ENT consulted 3/31 Trach placed, daughter updated, made DNR status 4/1 Colostomy revision, remains on vent, daughter updated 4/2 Failed weaning trials  4/4 Patient is on 5/5 back to put her on SAT SBT still minimally responsive and now looking for an LTAC bed. 4/5 Surgical clips removed patient fascia is open but muscles are closed needs twice a day wet-to-dry packing 4/6No major events overnight patient had beta-blocker restarted and here episodes of V. tach clearance has decreased. 4/7 Failure to wean from vent due to muscle fatigue and ischemic cardiomyopathy 4/8 Tolerating TCT for 24 hrs, off vent 4/9 Tolerating trach collar  transitioned to stepdown  New events last 24 hours / Subjective: Patient minimally responsive on my examination this morning.  Vital signs remained stable, no acute events reported overnight per RN.  She remains off vent  Assessment & Plan:   Principal Problem:   Diverticulitis Active Problems:   Fistula, bladder   Ulcerative colitis ([place])   Hyperlipidemia   Essential hypertension   Hypothyroidism   Abnormal ECG   CKD (chronic kidney disease), stage III (HCC)   Elevated lactic acid level   Protein-calorie malnutrition, severe   Septic shock, hemorrhagic shock secondary to complicated diverticulitis, perforation, pelvic abscesses, gangrene of the sigmoid colon status post sigmoid colectomy, end colostomy 3/24 -Shock resolved and off pressors, remains on midodrine  -ID recommended 10 day course antibiotics, completed 4/7  -Wound VAC in place, colostomy bag in place -General surgery following  -TF ongoing, possibly may need PEG tube if continues to fail speech eval   Acute hypoxemic respiratory failure -Failure to wean from the vent and ultimately had trach placed on 3/31 -Remains on trach collar presently  SVT Hx A Fib Sinus tachycardia -Continue amiodarone -Cardiology following   NSTEMI, ischemic cardiomyopathy -Echo showed EF 20-25% with grade 1 diastolic dysfunction -Cardiology following   Acute urinary retention -Urology consulted, recommended Foley catheter for another 1 or 2 weeks, and cystogram prior to Foley removal.  See Dr. Doristine Counter note dated 4/8  Hypothyroidism -Continue synthroid    In agreement with assessment of the pressure ulcer as below:  Pressure Injury 03/04/21 Ear Right;Lateral;Mid Stage 1 -  Intact skin with non-blanchable redness of a localized area usually over a bony prominence. (Active)  03/04/21 0800  Location: Ear  Location Orientation: Right;Lateral;Mid  Staging: Stage 1 -  Intact skin with non-blanchable redness of a localized area  usually  over a bony prominence.  Wound Description (Comments):   Present on Admission: No     Pressure Injury 03/04/21 Buttocks Left;Medial Deep Tissue Pressure Injury - Purple or maroon localized area of discolored intact skin or blood-filled blister due to damage of underlying soft tissue from pressure and/or shear. smaller one (Active)  03/04/21 0930  Location: Buttocks  Location Orientation: Left;Medial  Staging: Deep Tissue Pressure Injury - Purple or maroon localized area of discolored intact skin or blood-filled blister due to damage of underlying soft tissue from pressure and/or shear.  Wound Description (Comments): smaller one  Present on Admission: No     Nutrition Problem: Severe Malnutrition Etiology: acute illness   DVT prophylaxis:  enoxaparin (LOVENOX) injection 40 mg Start: 03/13/21 1800 SCDs Start: 02/21/21 2200  Code Status: DNR Family Communication: None at bedside  Disposition Plan:  Status is: Inpatient  Remains inpatient appropriate because:Altered mental status, Unsafe d/c plan, IV treatments appropriate due to intensity of illness or inability to take PO and Inpatient level of care appropriate due to severity of illness   Dispo: The patient is from: Home              Anticipated d/c is to: LTAC              Patient currently is not medically stable to d/c.   Difficult to place patient No   Antimicrobials:  Anti-infectives (From admission, onward)   Start     Dose/Rate Route Frequency Ordered Stop   03/13/21 1400  meropenem (MERREM) 1 g in sodium chloride 0.9 % 100 mL IVPB        1 g 200 mL/hr over 30 Minutes Intravenous Every 8 hours 03/13/21 0855 03/16/21 2203   03/12/21 1800  meropenem (MERREM) 1 g in sodium chloride 0.9 % 100 mL IVPB  Status:  Discontinued        1 g 200 mL/hr over 30 Minutes Intravenous Every 12 hours 03/12/21 0840 03/13/21 0855   03/10/21 1000  meropenem (MERREM) 1 g in sodium chloride 0.9 % 100 mL IVPB  Status:  Discontinued         1 g 200 mL/hr over 30 Minutes Intravenous Every 8 hours 03/10/21 0854 03/12/21 0840   03/09/21 0600  fluconazole (DIFLUCAN) IVPB 200 mg  Status:  Discontinued        200 mg 100 mL/hr over 60 Minutes Intravenous Every 24 hours 03/08/21 0751 03/14/21 1014   03/08/21 2200  meropenem (MERREM) 1 g in sodium chloride 0.9 % 100 mL IVPB  Status:  Discontinued        1 g 200 mL/hr over 30 Minutes Intravenous Every 12 hours 03/08/21 0748 03/10/21 0854   03/08/21 0600  fluconazole (DIFLUCAN) IVPB 400 mg  Status:  Discontinued        400 mg 100 mL/hr over 120 Minutes Intravenous Every 24 hours 03/07/21 1321 03/08/21 0839   03/07/21 1100  meropenem (MERREM) 1 g in sodium chloride 0.9 % 100 mL IVPB  Status:  Discontinued        1 g 200 mL/hr over 30 Minutes Intravenous Every 8 hours 03/07/21 1013 03/08/21 0748   03/07/21 1000  meropenem (MERREM) 1 g in sodium chloride 0.9 % 100 mL IVPB  Status:  Discontinued        1 g 200 mL/hr over 30 Minutes Intravenous Every 12 hours 03/06/21 2243 03/07/21 1013   03/07/21 0600  fluconazole (DIFLUCAN) IVPB 400 mg  400 mg 100 mL/hr over 120 Minutes Intravenous  Once 03/06/21 2243 03/07/21 0701   03/04/21 1800  vancomycin (VANCOREADY) IVPB 750 mg/150 mL        750 mg 150 mL/hr over 60 Minutes Intravenous Every 24 hours 03/03/21 1442 03/06/21 2040   03/03/21 0800  vancomycin (VANCOREADY) IVPB 1500 mg/300 mL  Status:  Discontinued        1,500 mg 150 mL/hr over 120 Minutes Intravenous Every 48 hours 03/02/21 1410 03/03/21 1442   03/02/21 0800  vancomycin (VANCOREADY) IVPB 750 mg/150 mL  Status:  Discontinued        750 mg 150 mL/hr over 60 Minutes Intravenous Every 24 hours 03/01/21 1954 03/02/21 1409   03/01/21 2200  ceFEPIme (MAXIPIME) 2 g in sodium chloride 0.9 % 100 mL IVPB        2 g 200 mL/hr over 30 Minutes Intravenous Every 12 hours 03/01/21 1903 03/06/21 2203   03/01/21 2100  vancomycin (VANCOREADY) IVPB 1250 mg/250 mL        1,250 mg 166.7  mL/hr over 90 Minutes Intravenous  Once 03/01/21 1954 03/02/21 0008   03/01/21 2000  metroNIDAZOLE (FLAGYL) IVPB 500 mg        500 mg 100 mL/hr over 60 Minutes Intravenous Every 8 hours 03/01/21 1903 03/06/21 2002   02/28/21 1800  piperacillin-tazobactam (ZOSYN) IVPB 3.375 g  Status:  Discontinued        3.375 g 12.5 mL/hr over 240 Minutes Intravenous Every 8 hours 02/28/21 1643 03/01/21 1903   02/28/21 1730  piperacillin-tazobactam (ZOSYN) IVPB 3.375 g  Status:  Discontinued        3.375 g 100 mL/hr over 30 Minutes Intravenous Every 8 hours 02/28/21 1635 02/28/21 1642   02/28/21 1300  Ampicillin-Sulbactam (UNASYN) 3 g in sodium chloride 0.9 % 100 mL IVPB  Status:  Discontinued        3 g 200 mL/hr over 30 Minutes Intravenous Every 6 hours 02/28/21 1156 02/28/21 1635   02/23/21 1230  piperacillin-tazobactam (ZOSYN) IVPB 3.375 g  Status:  Discontinued        3.375 g 12.5 mL/hr over 240 Minutes Intravenous Every 8 hours 02/23/21 1143 02/28/21 1155   02/22/21 0600  ciprofloxacin (CIPRO) IVPB 400 mg  Status:  Discontinued        400 mg 200 mL/hr over 60 Minutes Intravenous Every 12 hours 02/21/21 2028 02/23/21 1143   02/22/21 0400  metroNIDAZOLE (FLAGYL) IVPB 500 mg  Status:  Discontinued        500 mg 100 mL/hr over 60 Minutes Intravenous Every 8 hours 02/21/21 2028 02/23/21 1143   02/21/21 1830  ciprofloxacin (CIPRO) IVPB 400 mg        400 mg 200 mL/hr over 60 Minutes Intravenous  Once 02/21/21 1820 02/21/21 1939   02/21/21 1830  metroNIDAZOLE (FLAGYL) IVPB 500 mg        500 mg 100 mL/hr over 60 Minutes Intravenous  Once 02/21/21 1820 02/21/21 2110        Objective: Vitals:   03/18/21 0300 03/18/21 0400 03/18/21 0500 03/18/21 0600  BP: 133/74 130/80 (!) 149/90 105/64  Pulse:  (!) 106 (!) 121 (!) 105  Resp: (!) 21 (!) 21 17 (!) 23  Temp:  (!) 97.1 F (36.2 C)    TempSrc:  Oral    SpO2:  100% 100% 100%  Weight:      Height:        Intake/Output Summary (Last 24 hours) at  03/18/2021 0847  Last data filed at 03/18/2021 0459 Gross per 24 hour  Intake --  Output 1830 ml  Net -1830 ml   Filed Weights   02/27/21 0900 02/28/21 1014 03/01/21 1511  Weight: 66.9 kg 67.2 kg 67.2 kg    Examination:  General exam: Appears calm and comfortable, somnolent  Respiratory system: Clear to auscultation. Respiratory effort normal. No respiratory distress. On trach collar  Cardiovascular system: S1 & S2 heard, RRR. No murmurs. No pedal edema. Gastrointestinal system: Abdomen is nondistended, soft and nontender. +wound vac and colostomy bag in place  Extremities: Symmetric in appearance  Skin: No rashes, lesions or ulcers on exposed skin    Data Reviewed: I have personally reviewed following labs and imaging studies  CBC: Recent Labs  Lab 03/14/21 0420 03/15/21 0446 03/16/21 0525 03/17/21 0500 03/18/21 0627  WBC 12.8* 10.0 9.4 8.4 8.5  NEUTROABS 10.6* 8.2* 7.0 6.3 6.6  HGB 8.9* 9.6* 10.1* 10.0* 9.8*  HCT 28.5* 29.8* 31.4* 30.7* 30.4*  MCV 96.9 97.1 95.2 94.8 95.3  PLT 213 215 221 204 977   Basic Metabolic Panel: Recent Labs  Lab 03/14/21 0420 03/15/21 0446 03/16/21 0525 03/17/21 0500 03/18/21 0627  NA 146* 148* 148* 144 144  K 3.5 4.1 3.2* 4.2 3.7  CL 117* 118* 115* 116* 117*  CO2 _0 GLUCOSE 118* 135* 153* 145* 156*  BUN 46* 42* 39* 43* 50*  CREATININE 0.64 0.77 0.67 0.71 0.82  CALCIUM 7.9* 8.0* 8.0* 8.1* 8.4*  MG 2.2 1.8 1.8 2.0 1.9  PHOS 2.5 2.7 2.9 2.7 3.5   GFR: Estimated Creatinine Clearance: 60.2 mL/min (by C-G formula based on SCr of 0.82 mg/dL). Liver Function Tests: Recent Labs  Lab 03/12/21 0431 03/13/21 0442 03/14/21 0420 03/15/21 0446 03/16/21 0525  AST 62* 104* 309* 170* 112*  ALT 32 49* 163* 129* 94*  ALKPHOS 70 63 85 89 86  BILITOT 0.7 0.7 0.7 0.8 0.5  PROT 5.5* 5.7* 5.6* 5.7* 5.7*  ALBUMIN 2.3* 2.4* 2.3* 2.4* 2.4*   No results for input(s): LIPASE, AMYLASE in the last 168 hours. No results for input(s):  AMMONIA in the last 168 hours. Coagulation Profile: No results for input(s): INR, PROTIME in the last 168 hours. Cardiac Enzymes: No results for input(s): CKTOTAL, CKMB, CKMBINDEX, TROPONINI in the last 168 hours. BNP (last 3 results) No results for input(s): PROBNP in the last 8760 hours. HbA1C: No results for input(s): HGBA1C in the last 72 hours. CBG: Recent Labs  Lab 03/17/21 1547 03/17/21 1940 03/17/21 2322 03/18/21 0312 03/18/21 0737  GLUCAP 157* 75 131* 146* 138*   Lipid Profile: No results for input(s): CHOL, HDL, LDLCALC, TRIG, CHOLHDL, LDLDIRECT in the last 72 hours. Thyroid Function Tests: No results for input(s): TSH, T4TOTAL, FREET4, T3FREE, THYROIDAB in the last 72 hours. Anemia Panel: No results for input(s): VITAMINB12, FOLATE, FERRITIN, TIBC, IRON, RETICCTPCT in the last 72 hours. Sepsis Labs: No results for input(s): PROCALCITON, LATICACIDVEN in the last 168 hours.  No results found for this or any previous visit (from the past 240 hour(s)).    Radiology Studies: DG Abd 1 View  Result Date: 03/17/2021 CLINICAL DATA:  NG tube placement. EXAM: ABDOMEN - 1 VIEW COMPARISON:  Earlier same day FINDINGS: NG tube is positioned in the proximal stomach with the proximal side port in the gastric fundus. Nonspecific bowel gas pattern evident. IMPRESSION: NG tube tip is in the stomach. Electronically Signed   By: Misty Stanley M.D.   On:  03/17/2021 17:51   DG Abd 1 View  Result Date: 03/17/2021 CLINICAL DATA:  Enteric tube placement. EXAM: ABDOMEN - 1 VIEW COMPARISON:  Abdominal x-ray dated March 08, 2021. FINDINGS: Enteric tube in the stomach with the tip at the fundus. The bowel gas pattern is normal. No radio-opaque calculi or other significant radiographic abnormality are seen. IMPRESSION: 1. Enteric tube in the stomach. Electronically Signed   By: Titus Dubin M.D.   On: 03/17/2021 14:17      Scheduled Meds: . amiodarone  200 mg Per Tube BID  . chlorhexidine  gluconate (MEDLINE KIT)  15 mL Mouth Rinse BID  . Chlorhexidine Gluconate Cloth  6 each Topical Daily  . docusate  100 mg Per Tube BID  . enoxaparin (LOVENOX) injection  40 mg Subcutaneous Q24H  . free water  200 mL Per Tube Q4H  . insulin aspart  0-20 Units Subcutaneous Q4H  . levothyroxine  75 mcg Per Tube Q0600  . mouth rinse  15 mL Mouth Rinse 10 times per day  . midodrine  10 mg Per Tube TID WC  . multivitamin with minerals  1 tablet Per Tube Daily  . nutrition supplement (JUVEN)  1 packet Per Tube BID BM  . pantoprazole (PROTONIX) IV  40 mg Intravenous Daily  . polyethylene glycol  17 g Per Tube Daily  . potassium chloride  40 mEq Per Tube Once  . sodium chloride flush  10-40 mL Intracatheter Q12H  . sodium chloride flush  5 mL Intracatheter Q8H   Continuous Infusions: . sodium chloride Stopped (03/02/21 1553)  . feeding supplement (VITAL AF 1.2 CAL) 1,000 mL (03/16/21 1452)  . magnesium sulfate bolus IVPB       LOS: 25 days      Time spent: 45 minutes   Dessa Phi, DO [facility] 03/18/2021, 8:47 AM   Available via Epic secure chat 7am-7pm After these hours, please refer to coverage provider listed on [email or web address]

## 2021-03-18 NOTE — Progress Notes (Signed)
[facility] for Electrolyte Monitoring and Replacement   Recent Labs: Potassium (mmol/L)  Date Value  03/18/2021 3.7   Magnesium (mg/dL)  Date Value  03/18/2021 1.9   Calcium (mg/dL)  Date Value  03/18/2021 8.4 (L)   Albumin (g/dL)  Date Value  03/16/2021 2.4 (L)   Phosphorus (mg/dL)  Date Value  03/18/2021 3.5   Sodium (mmol/L)  Date Value  03/18/2021 144   Corrected Ca: 9.3  Assessment: Desiree Madden is a 70 y.o. female admitted on 02/21/2021 with intra-abdominal infection (diverticulitis w/ fecal perforation). Patient septic with acute diverticulitis abdominal abscess with colovesical fistula and enterococcus urinary tract infection which was further complicated by the development of complicated diverticulitis with perforation and gangrenous changes requiring Hartman's procedure performed on 03/01/21. Pt developed severe septic shock after procedure requiring vasopressors. PMH includes CAD s/p CABG (2015), HTN, hypothyroidism, colon bladder fistula, diverticulitis, and ulcerative colitis. Pharmacy has been consulted for electrolyte monitoring.  On amiodarone, and free water 200 ml q4H.   Patient was extubated and re-intubated 3/29, tracheostomy placed 3/31  Nutrition: Continous tube feeds @ 65 mL/hr    Goal of Therapy:  K 4.0-5.1 Mg 2.0-2.4 Electrolytes WNL  Plan:  Will give Mg 2 g IV x 1 and KCl 40 mEq x 1.  -- F/u electrolytes with AM labs  Oswald Hillock, PharmD 03/18/2021 7:45 AM

## 2021-03-18 NOTE — Progress Notes (Signed)
[facility] Hospital Day(s): 25.   Post op day(s): 9 Days Post-Op.   Interval History:  No significant changes in the past 24 hours. Patient seen and examined; drowsy, but responds appropriately No acute events or new complaints overnight.  Tolerating tracheostomy collar without ventilator No abdominal pain Remains without leukocytosis. Hgb stable. Maintaining renal function On tube feedings; goal rate Having colostomy function  Vital signs in last 24 hours: [min-max] current  Temp:  [97.1 F (36.2 C)-98.1 F (36.7 C)] 98.1 F (36.7 C) (04/10 0800) Pulse Rate:  [101-133] 101 (04/10 0900) Resp:  [16-23] 20 (04/10 0900) BP: (105-163)/(57-94) 116/70 (04/10 0900) SpO2:  [100 %] 100 % (04/10 0900) FiO2 (%):  [28 %] 28 % (04/10 1213)     Height: 5' 4.02" (162.6 cm) Weight: 67.2 kg BMI (Calculated): 25.42   Intake/Output last 2 shifts:  04/09 0701 - 04/10 0700 In: 200 [NG/GT:200] Out: 2105 [Urine:1950; Drains:30; Stool:125]   Physical Exam:  Constitutional: more alert, participatory, NAD Respiratory: s/p tracheostomy Cardiovascular: tachycardic and sinus rhythm  Gastrointestinal: soft, non-tender, and non-distended. Colostomy in the left mid-abdomen, pink/patent, stool in bac Integumentary: Midline wound healing via secondary intention; no erythema; VAC in place with good seal  Labs:  CBC Latest Ref Rng & Units 03/18/2021 03/17/2021 03/16/2021  WBC 4.0 - 10.5 K/uL 8.5 8.4 9.4  Hemoglobin 12.0 - 15.0 g/dL 9.8(L) 10.0(L) 10.1(L)  Hematocrit 36.0 - 46.0 % 30.4(L) 30.7(L) 31.4(L)  Platelets 150 - 400 K/uL 192 204 221   CMP Latest Ref Rng & Units 03/18/2021 03/17/2021 03/16/2021  Glucose 70 - 99 mg/dL 156(H) 145(H) 153(H)  BUN 8 - 23 mg/dL 50(H) 43(H) 39(H)  Creatinine 0.44 - 1.00 mg/dL 0.82 0.71 0.67  Sodium 135 - 145 mmol/L 144 144 148(H)  Potassium 3.5 - 5.1 mmol/L 3.7 4.2 3.2(L)  Chloride 98 - 111 mmol/L 117(H) 116(H) 115(H)  CO2 22 - 32  mmol/L $Remov'22 22 24  '[id number]$ Calcium 8.9 - 10.3 mg/dL 8.4(L) 8.1(L) 8.0(L)  Total Protein 6.5 - 8.1 g/dL - - 5.7(L)  Total Bilirubin 0.3 - 1.2 mg/dL - - 0.5  Alkaline Phos 38 - 126 U/L - - 86  AST 15 - 41 U/L - - 112(H)  ALT 0 - 44 U/L - - 94(H)    Imaging studies:No new pertinent imaging studies   Assessment/Plan:  70 y.o. female 13 Days Post-Op s/p Hartman's Procedurefor complicated diverticulitis with gangrenous changes and perforation with feculent peritonitis and 5 days s/p colostomy revision    - Okay to continue goal rate tube feedings; ? Role for PEG placement, may need to discuss with GI as there is not much room for surgical placement - Appreciate PCCM assistance;  - Continue Abx (Meropenem); ID on board - Monitor abdominal examination - Monitor colostomy output; WOC RN following - Monitor leukocytosis; resolved x48 hours - Maintain foley catheter; monitor UO; d/w with urology, recommend leaving x1 month from date of surgery and performing cystogram prior to removal consideration - Midline wound care: Wound vac placed with WOC RN; MWF schedule as amenable  - Disposition plan for LTACH at this time

## 2021-03-18 NOTE — Progress Notes (Signed)
  PROGRESS NOTE  Checked on patient this afternoon. Dry erase board in room indicates she goes by "Desiree Madden." When called by this name, she flutters her eyes. When asked if she's doing ok, she nods her head yes. When asked if she's in pain, she shakes her head no. She does not stay awake to answer any questions. She appears to be comfortable, VSS.    Dessa Phi, DO [facility] 03/18/2021, 2:06 PM  Available via Epic secure chat 7am-7pm After these hours, please refer to coverage provider listed on [email or web address]

## 2021-03-19 DIAGNOSIS — K5792 Diverticulitis of intestine, part unspecified, without perforation or abscess without bleeding: Secondary | ICD-10-CM | POA: Diagnosis not present

## 2021-03-19 LAB — CBC
HCT: 30.4 % — ABNORMAL LOW (ref 36.0–46.0)
Hemoglobin: 9.7 g/dL — ABNORMAL LOW (ref 12.0–15.0)
MCH: 30.4 pg (ref 26.0–34.0)
MCHC: 31.9 g/dL (ref 30.0–36.0)
MCV: 95.3 fL (ref 80.0–100.0)
Platelets: 186 10*3/uL (ref 150–400)
RBC: 3.19 MIL/uL — ABNORMAL LOW (ref 3.87–5.11)
RDW: 17.6 % — ABNORMAL HIGH (ref 11.5–15.5)
WBC: 8 10*3/uL (ref 4.0–10.5)
nRBC: 0 % (ref 0.0–0.2)

## 2021-03-19 LAB — BASIC METABOLIC PANEL
Anion gap: 6 (ref 5–15)
BUN: 59 mg/dL — ABNORMAL HIGH (ref 8–23)
CO2: 22 mmol/L (ref 22–32)
Calcium: 8.5 mg/dL — ABNORMAL LOW (ref 8.9–10.3)
Chloride: 114 mmol/L — ABNORMAL HIGH (ref 98–111)
Creatinine, Ser: 0.87 mg/dL (ref 0.44–1.00)
GFR, Estimated: >60 mL/min (ref >60)
Glucose, Bld: 139 mg/dL — ABNORMAL HIGH (ref 70–99)
Potassium: 4.4 mmol/L (ref 3.5–5.1)
Sodium: 142 mmol/L (ref 135–145)

## 2021-03-19 LAB — GLUCOSE, CAPILLARY
Glucose-Capillary: 127 mg/dL — ABNORMAL HIGH (ref 70–99)
Glucose-Capillary: 138 mg/dL — ABNORMAL HIGH (ref 70–99)
Glucose-Capillary: 145 mg/dL — ABNORMAL HIGH (ref 70–99)
Glucose-Capillary: 147 mg/dL — ABNORMAL HIGH (ref 70–99)
Glucose-Capillary: 164 mg/dL — ABNORMAL HIGH (ref 70–99)
Glucose-Capillary: 176 mg/dL — ABNORMAL HIGH (ref 70–99)
Glucose-Capillary: 99 mg/dL (ref 70–99)

## 2021-03-19 LAB — PHOSPHORUS: Phosphorus: 3.3 mg/dL (ref 2.5–4.6)

## 2021-03-19 LAB — MAGNESIUM: Magnesium: 2.2 mg/dL (ref 1.7–2.4)

## 2021-03-19 MED ORDER — FREE WATER
100.0000 mL | Status: DC
  Administered 2021-03-19 – 2021-03-22 (×18): 100 mL

## 2021-03-19 NOTE — Progress Notes (Signed)
PROGRESS NOTE    Desiree Madden  HXT:[id number] DOB: 02/18/1951 DOA: 02/21/2021 PCP: [place]     Brief Narrative:  Desiree Madden is a 70 yo female who was admitted on 02/21/21 with chief complaint of abdominal pain. He has medical history significant for colon-bladder fistula, diverticulitis, HTN, hypothyroidism, HLD, UC. On 3/24, she developed complicated diverticulitis with perforation and underwent sigmoid colectomy with end colostomy and Hartman's procedure.  Postoperatively, patient was admitted to the ICU, remained intubated, required multiple vasopressors due to septic shock.  Significant hospital events listed below:  3/16/2022Admitted to Med/Surg unit with Diverticulitis and UTI 3/23 LLQ abdominal drain placed by IR to pelvic abscess 3/24Worsening Leukocytosis despite drain placement. Found to have complicated Diverticulitis with perforation requiring Sigmoid Colectomy with end colostomy and Hartman's Procedure. Post procedure she returns to ICU and remains intubated with Septic Shock requiring multiple vasopressors. Left Femoral CVC and Arterial lines placed emergently 3/27Severe anasarca, albumin 1.8 DX of NSTEMI 3/28 Failure to wean from vent 3/29 Severe agitation, septic shock on pressors 3/29 Failed extubation, re-intubated  3/30 ENT consulted 3/31 Trach placed, daughter updated, made DNR status 4/1 Colostomy revision, remains on vent, daughter updated 4/2 Failed weaning trials  4/4 Patient is on 5/5 back to put her on SAT SBT still minimally responsive and now looking for an LTAC bed. 4/5 Surgical clips removed patient fascia is open but muscles are closed needs twice a day wet-to-dry packing 4/6No major events overnight patient had beta-blocker restarted and here episodes of V. tach clearance has decreased. 4/7 Failure to wean from vent due to muscle fatigue and ischemic cardiomyopathy 4/8 Tolerating TCT for 24 hrs, off vent 4/9 Tolerating trach collar  transitioned to stepdown 4/11 More alert, transfer to progressive unit   New events last 24 hours / Subjective: Patient more alert today.  She is able to nod her head yes, and shake her head no but does not readily engage in conversation or answers questions otherwise.  Remains off vent and remained stable.  Assessment & Plan:   Principal Problem:   Diverticulitis Active Problems:   Fistula, bladder   Ulcerative colitis ([place])   Hyperlipidemia   Essential hypertension   Hypothyroidism   Abnormal ECG   CKD (chronic kidney disease), stage III (HCC)   Elevated lactic acid level   Protein-calorie malnutrition, severe   Septic shock, hemorrhagic shock secondary to complicated diverticulitis, perforation, pelvic abscesses, gangrene of the sigmoid colon status post sigmoid colectomy, end colostomy 3/24 -Shock resolved and off pressors, remains on midodrine  -ID recommended 10 day course antibiotics, completed 4/7  -Wound VAC in place, colostomy bag in place -General surgery following  -TF ongoing, possibly may need PEG tube if continues to fail speech eval   Acute hypoxemic respiratory failure -Failure to wean from the vent and ultimately had trach placed on 3/31 -Remains on trach collar presently  SVT Hx A Fib Sinus tachycardia -Continue amiodarone -Cardiology following   NSTEMI, ischemic cardiomyopathy -Echo showed EF 20-25% with grade 1 diastolic dysfunction -Cardiology following   Acute urinary retention -Urology consulted, recommended Foley catheter for another 1 or 2 weeks, and cystogram prior to Foley removal.  See Dr. Doristine Counter note dated 4/8  Hypothyroidism -Continue synthroid     DVT prophylaxis:  enoxaparin (LOVENOX) injection 40 mg Start: 03/13/21 1800 SCDs Start: 02/21/21 2200  Code Status: DNR Family Communication: None at bedside  Disposition Plan:  Status is: Inpatient  Remains inpatient appropriate because:Unsafe d/c plan, IV treatments appropriate  due to intensity of illness or inability to take PO and Inpatient level of care appropriate due to severity of illness   Dispo: The patient is from: Home              Anticipated d/c is to: LTAC              Patient currently is not medically stable to d/c.  Remains on tube feeds through [facility].  Continue speech evaluation, need to consider PEG   Difficult to place patient No   Antimicrobials:  Anti-infectives (From admission, onward)   Start     Dose/Rate Route Frequency Ordered Stop   03/13/21 1400  meropenem (MERREM) 1 g in sodium chloride 0.9 % 100 mL IVPB        1 g 200 mL/hr over 30 Minutes Intravenous Every 8 hours 03/13/21 0855 03/16/21 2203   03/12/21 1800  meropenem (MERREM) 1 g in sodium chloride 0.9 % 100 mL IVPB  Status:  Discontinued        1 g 200 mL/hr over 30 Minutes Intravenous Every 12 hours 03/12/21 0840 03/13/21 0855   03/10/21 1000  meropenem (MERREM) 1 g in sodium chloride 0.9 % 100 mL IVPB  Status:  Discontinued        1 g 200 mL/hr over 30 Minutes Intravenous Every 8 hours 03/10/21 0854 03/12/21 0840   03/09/21 0600  fluconazole (DIFLUCAN) IVPB 200 mg  Status:  Discontinued        200 mg 100 mL/hr over 60 Minutes Intravenous Every 24 hours 03/08/21 0751 03/14/21 1014   03/08/21 2200  meropenem (MERREM) 1 g in sodium chloride 0.9 % 100 mL IVPB  Status:  Discontinued        1 g 200 mL/hr over 30 Minutes Intravenous Every 12 hours 03/08/21 0748 03/10/21 0854   03/08/21 0600  fluconazole (DIFLUCAN) IVPB 400 mg  Status:  Discontinued        400 mg 100 mL/hr over 120 Minutes Intravenous Every 24 hours 03/07/21 1321 03/08/21 0839   03/07/21 1100  meropenem (MERREM) 1 g in sodium chloride 0.9 % 100 mL IVPB  Status:  Discontinued        1 g 200 mL/hr over 30 Minutes Intravenous Every 8 hours 03/07/21 1013 03/08/21 0748   03/07/21 1000  meropenem (MERREM) 1 g in sodium chloride 0.9 % 100 mL IVPB  Status:  Discontinued        1 g 200 mL/hr over 30 Minutes Intravenous  Every 12 hours 03/06/21 2243 03/07/21 1013   03/07/21 0600  fluconazole (DIFLUCAN) IVPB 400 mg        400 mg 100 mL/hr over 120 Minutes Intravenous  Once 03/06/21 2243 03/07/21 0701   03/04/21 1800  vancomycin (VANCOREADY) IVPB 750 mg/150 mL        750 mg 150 mL/hr over 60 Minutes Intravenous Every 24 hours 03/03/21 1442 03/06/21 2040   03/03/21 0800  vancomycin (VANCOREADY) IVPB 1500 mg/300 mL  Status:  Discontinued        1,500 mg 150 mL/hr over 120 Minutes Intravenous Every 48 hours 03/02/21 1410 03/03/21 1442   03/02/21 0800  vancomycin (VANCOREADY) IVPB 750 mg/150 mL  Status:  Discontinued        750 mg 150 mL/hr over 60 Minutes Intravenous Every 24 hours 03/01/21 1954 03/02/21 1409   03/01/21 2200  ceFEPIme (MAXIPIME) 2 g in sodium chloride 0.9 % 100 mL IVPB        2  g 200 mL/hr over 30 Minutes Intravenous Every 12 hours 03/01/21 1903 03/06/21 2203   03/01/21 2100  vancomycin (VANCOREADY) IVPB 1250 mg/250 mL        1,250 mg 166.7 mL/hr over 90 Minutes Intravenous  Once 03/01/21 1954 03/02/21 0008   03/01/21 2000  metroNIDAZOLE (FLAGYL) IVPB 500 mg        500 mg 100 mL/hr over 60 Minutes Intravenous Every 8 hours 03/01/21 1903 03/06/21 2002   02/28/21 1800  piperacillin-tazobactam (ZOSYN) IVPB 3.375 g  Status:  Discontinued        3.375 g 12.5 mL/hr over 240 Minutes Intravenous Every 8 hours 02/28/21 1643 03/01/21 1903   02/28/21 1730  piperacillin-tazobactam (ZOSYN) IVPB 3.375 g  Status:  Discontinued        3.375 g 100 mL/hr over 30 Minutes Intravenous Every 8 hours 02/28/21 1635 02/28/21 1642   02/28/21 1300  Ampicillin-Sulbactam (UNASYN) 3 g in sodium chloride 0.9 % 100 mL IVPB  Status:  Discontinued        3 g 200 mL/hr over 30 Minutes Intravenous Every 6 hours 02/28/21 1156 02/28/21 1635   02/23/21 1230  piperacillin-tazobactam (ZOSYN) IVPB 3.375 g  Status:  Discontinued        3.375 g 12.5 mL/hr over 240 Minutes Intravenous Every 8 hours 02/23/21 1143 02/28/21 1155    02/22/21 0600  ciprofloxacin (CIPRO) IVPB 400 mg  Status:  Discontinued        400 mg 200 mL/hr over 60 Minutes Intravenous Every 12 hours 02/21/21 2028 02/23/21 1143   02/22/21 0400  metroNIDAZOLE (FLAGYL) IVPB 500 mg  Status:  Discontinued        500 mg 100 mL/hr over 60 Minutes Intravenous Every 8 hours 02/21/21 2028 02/23/21 1143   02/21/21 1830  ciprofloxacin (CIPRO) IVPB 400 mg        400 mg 200 mL/hr over 60 Minutes Intravenous  Once 02/21/21 1820 02/21/21 1939   02/21/21 1830  metroNIDAZOLE (FLAGYL) IVPB 500 mg        500 mg 100 mL/hr over 60 Minutes Intravenous  Once 02/21/21 1820 02/21/21 2110       Objective: Vitals:   03/19/21 0400 03/19/21 0500 03/19/21 0600 03/19/21 0700  BP: 122/65 131/74 110/68 103/65  Pulse: (!) 111 (!) 106 96 97  Resp: (!) _0 Temp:      TempSrc:      SpO2: 100% 100% 100% 100%  Weight:      Height:        Intake/Output Summary (Last 24 hours) at 03/19/2021 0944 Last data filed at 03/19/2021 0800 Gross per 24 hour  Intake 75 ml  Output 1630 ml  Net -1555 ml   Filed Weights   02/27/21 0900 02/28/21 1014 03/01/21 1511  Weight: 66.9 kg 67.2 kg 67.2 kg    Examination: General exam: Appears calm and comfortable  Respiratory system: Clear to auscultation. Respiratory effort normal.  Trach collar in place Cardiovascular system: S1 & S2 heard, RRR. No pedal edema. Gastrointestinal system: Abdomen is nondistended, soft  Central nervous system: Alert  Extremities: Symmetric in appearance bilaterally  Psychiatry: Stable   Data Reviewed: I have personally reviewed following labs and imaging studies  CBC: Recent Labs  Lab 03/14/21 0420 03/15/21 0446 03/16/21 0525 03/17/21 0500 03/18/21 0627 03/19/21 0525  WBC 12.8* 10.0 9.4 8.4 8.5 8.0  NEUTROABS 10.6* 8.2* 7.0 6.3 6.6  --   HGB 8.9* 9.6* 10.1* 10.0* 9.8* 9.7*  HCT 28.5*  29.8* 31.4* 30.7* 30.4* 30.4*  MCV 96.9 97.1 95.2 94.8 95.3 95.3  PLT 213 215 221 204 192 916   Basic  Metabolic Panel: Recent Labs  Lab 03/15/21 0446 03/16/21 0525 03/17/21 0500 03/18/21 0627 03/19/21 0525  NA 148* 148* 144 144 142  K 4.1 3.2* 4.2 3.7 4.4  CL 118* 115* 116* 117* 114*  CO2 _0 GLUCOSE 135* 153* 145* 156* 139*  BUN 42* 39* 43* 50* 59*  CREATININE 0.77 0.67 0.71 0.82 0.87  CALCIUM 8.0* 8.0* 8.1* 8.4* 8.5*  MG 1.8 1.8 2.0 1.9 2.2  PHOS 2.7 2.9 2.7 3.5 3.3   GFR: Estimated Creatinine Clearance: 56.7 mL/min (by C-G formula based on SCr of 0.87 mg/dL). Liver Function Tests: Recent Labs  Lab 03/13/21 0442 03/14/21 0420 03/15/21 0446 03/16/21 0525  AST 104* 309* 170* 112*  ALT 49* 163* 129* 94*  ALKPHOS 63 85 89 86  BILITOT 0.7 0.7 0.8 0.5  PROT 5.7* 5.6* 5.7* 5.7*  ALBUMIN 2.4* 2.3* 2.4* 2.4*   No results for input(s): LIPASE, AMYLASE in the last 168 hours. No results for input(s): AMMONIA in the last 168 hours. Coagulation Profile: No results for input(s): INR, PROTIME in the last 168 hours. Cardiac Enzymes: No results for input(s): CKTOTAL, CKMB, CKMBINDEX, TROPONINI in the last 168 hours. BNP (last 3 results) No results for input(s): PROBNP in the last 8760 hours. HbA1C: No results for input(s): HGBA1C in the last 72 hours. CBG: Recent Labs  Lab 03/18/21 1551 03/18/21 2001 03/19/21 0017 03/19/21 0325 03/19/21 0729  GLUCAP 150* 147* 99 127* 138*   Lipid Profile: No results for input(s): CHOL, HDL, LDLCALC, TRIG, CHOLHDL, LDLDIRECT in the last 72 hours. Thyroid Function Tests: No results for input(s): TSH, T4TOTAL, FREET4, T3FREE, THYROIDAB in the last 72 hours. Anemia Panel: No results for input(s): VITAMINB12, FOLATE, FERRITIN, TIBC, IRON, RETICCTPCT in the last 72 hours. Sepsis Labs: No results for input(s): PROCALCITON, LATICACIDVEN in the last 168 hours.  No results found for this or any previous visit (from the past 240 hour(s)).    Radiology Studies: DG Abd 1 View  Result Date: 03/17/2021 CLINICAL DATA:  NG tube  placement. EXAM: ABDOMEN - 1 VIEW COMPARISON:  Earlier same day FINDINGS: NG tube is positioned in the proximal stomach with the proximal side port in the gastric fundus. Nonspecific bowel gas pattern evident. IMPRESSION: NG tube tip is in the stomach. Electronically Signed   By: Misty Stanley M.D.   On: 03/17/2021 17:51   DG Abd 1 View  Result Date: 03/17/2021 CLINICAL DATA:  Enteric tube placement. EXAM: ABDOMEN - 1 VIEW COMPARISON:  Abdominal x-ray dated March 08, 2021. FINDINGS: Enteric tube in the stomach with the tip at the fundus. The bowel gas pattern is normal. No radio-opaque calculi or other significant radiographic abnormality are seen. IMPRESSION: 1. Enteric tube in the stomach. Electronically Signed   By: Titus Dubin M.D.   On: 03/17/2021 14:17   DG Chest Port 1 View  Result Date: 03/18/2021 CLINICAL DATA:  Sepsis EXAM: PORTABLE CHEST 1 VIEW COMPARISON:  March 12, 2021 FINDINGS: Tracheostomy catheter tip is 5.4 cm above the carina. Central catheter tip in right atrium slightly distal to the cavoatrial junction. Nasogastric tube tip and side port in stomach. No pneumothorax. There is atelectatic change in the right base. Lungs elsewhere are clear. The heart size and pulmonary vascular normal. Patient is status post coronary artery bypass grafting. No bone lesions. IMPRESSION: Tube  and catheter positions as described without pneumothorax. Right base atelectasis. Lungs otherwise clear. Stable cardiac silhouette. Electronically Signed   By: Lowella Grip III M.D.   On: 03/18/2021 10:43      Scheduled Meds: . amiodarone  200 mg Per Tube BID  . chlorhexidine gluconate (MEDLINE KIT)  15 mL Mouth Rinse BID  . Chlorhexidine Gluconate Cloth  6 each Topical Daily  . docusate  100 mg Per Tube BID  . enoxaparin (LOVENOX) injection  40 mg Subcutaneous Q24H  . free water  200 mL Per Tube Q4H  . insulin aspart  0-20 Units Subcutaneous Q4H  . levothyroxine  75 mcg Per Tube Q0600  . mouth  rinse  15 mL Mouth Rinse 10 times per day  . midodrine  10 mg Per Tube TID WC  . multivitamin with minerals  1 tablet Per Tube Daily  . nutrition supplement (JUVEN)  1 packet Per Tube BID BM  . pantoprazole (PROTONIX) IV  40 mg Intravenous Daily  . polyethylene glycol  17 g Per Tube Daily  . sodium chloride flush  10-40 mL Intracatheter Q12H  . sodium chloride flush  5 mL Intracatheter Q8H   Continuous Infusions: . sodium chloride Stopped (03/02/21 1553)  . feeding supplement (VITAL AF 1.2 CAL) 1,000 mL (03/19/21 0300)     LOS: 26 days      Time spent: 25 minutes   Dessa Phi, DO [facility] 03/19/2021, 9:44 AM   Available via Epic secure chat 7am-7pm After these hours, please refer to coverage provider listed on [email or web address]

## 2021-03-19 NOTE — Progress Notes (Signed)
[place] for Electrolyte Monitoring and Replacement   Recent Labs: Potassium (mmol/L)  Date Value  03/19/2021 4.4   Magnesium (mg/dL)  Date Value  03/19/2021 2.2   Calcium (mg/dL)  Date Value  03/19/2021 8.5 (L)   Albumin (g/dL)  Date Value  03/16/2021 2.4 (L)   Phosphorus (mg/dL)  Date Value  03/19/2021 3.3   Sodium (mmol/L)  Date Value  03/19/2021 142   Corrected Ca: 9.8 mg/dL  Assessment: Desiree Madden is a 70 y.o. female admitted on 02/21/2021 with intra-abdominal infection (diverticulitis w/ fecal perforation). Patient septic with acute diverticulitis abdominal abscess with colovesical fistula and enterococcus urinary tract infection which was further complicated by the development of complicated diverticulitis with perforation and gangrenous changes requiring Hartman's procedure performed on 03/01/21. PMH includes CAD s/p CABG (2015), HTN, hypothyroidism, colon bladder fistula, diverticulitis, and ulcerative colitis. Pharmacy was consulted for electrolyte monitoring.  Nutrition: Continous tube feeds @ 65 mL/hr    Free water 200 mL per tube every 4 hours  Goal of Therapy:  K 4.0 - 5.1 mmol/L Mg 2.0 - 2.4 mg/dL All Other Electrolytes WNL  Plan:   No electrolyte replacement warranted today  Sodium continues to trend down:  Adjust free water to 100 mL per tube every 4 hours  Re-check electrolytes with AM labs  Dallie Piles, PharmD 03/19/2021 7:09 AM

## 2021-03-19 NOTE — Progress Notes (Signed)
[place] Hospital Day(s): 26.   Post op day(s): 10 Days Post-Op.   Interval History:  No significant changes in the past 24 hours. Patient seen and examined; no reported issues this morning Denied any abdominal pain Tolerating tracheostomy collar without ventilator Remains without leukocytosis; WBC 8.0 Hgb stable at 9.7 Maintaining renal function; sCr - 0.87; UO - 1.2L No electrolyte derangements On tube feedings; goal rate Having colostomy function  Vital signs in last 24 hours: [min-max] current  Temp:  [97.8 F (36.6 C)-98.1 F (36.7 C)] 97.8 F (36.6 C) (04/11 0328) Pulse Rate:  [90-116] 97 (04/11 0700) Resp:  [13-38] 17 (04/11 0700) BP: (103-132)/(57-101) 103/65 (04/11 0700) SpO2:  [97 %-100 %] 100 % (04/11 0700) FiO2 (%):  [28 %] 28 % (04/11 0328)     Height: 5' 4.02" (162.6 cm) Weight: 67.2 kg BMI (Calculated): 25.42   Intake/Output last 2 shifts:  04/10 0701 - 04/11 0700 In: 75 [I.V.:15; NG/GT:60] Out: 1580 [Urine:1250; Stool:330]   Physical Exam:  Constitutional: more alert, participatory, NAD Respiratory: s/p tracheostomy Cardiovascular: tachycardic to 100 and sinus rhythm Gastrointestinal: soft, non-tender, and non-distended. Colostomy in the left mid-abdomen, pink/patent, stool in bag Integumentary: Midline wound healing via secondary intention; no erythema; VAC in place with good seal  Labs:  CBC Latest Ref Rng & Units 03/19/2021 03/18/2021 03/17/2021  WBC 4.0 - 10.5 K/uL 8.0 8.5 8.4  Hemoglobin 12.0 - 15.0 g/dL 9.7(L) 9.8(L) 10.0(L)  Hematocrit 36.0 - 46.0 % 30.4(L) 30.4(L) 30.7(L)  Platelets 150 - 400 K/uL 186 192 204   CMP Latest Ref Rng & Units 03/19/2021 03/18/2021 03/17/2021  Glucose 70 - 99 mg/dL 139(H) 156(H) 145(H)  BUN 8 - 23 mg/dL 59(H) 50(H) 43(H)  Creatinine 0.44 - 1.00 mg/dL 0.87 0.82 0.71  Sodium 135 - 145 mmol/L 142 144 144  Potassium 3.5 - 5.1 mmol/L 4.4 3.7 4.2  Chloride 98 - 111 mmol/L 114(H)  117(H) 116(H)  CO2 22 - 32 mmol/L $RemoveB'22 22 22  'XwPnSrGc$ Calcium 8.9 - 10.3 mg/dL 8.5(L) 8.4(L) 8.1(L)  Total Protein 6.5 - 8.1 g/dL - - -  Total Bilirubin 0.3 - 1.2 mg/dL - - -  Alkaline Phos 38 - 126 U/L - - -  AST 15 - 41 U/L - - -  ALT 0 - 44 U/L - - -    Imaging studies: No new pertinent imaging studies   Assessment/Plan:  70 y.o. female 16 Days Post-Op s/p Hartman's Procedurefor complicated diverticulitis with gangrenous changes and perforation with feculent peritonitis and 5 days s/p colostomy revision    - Okay to continue goal rate tube feedings; ? Role for PEG placement, may need to discuss with GI as there is not much room for surgical placement - Appreciate PCCM assistance;  - Monitor abdominal examination - Monitor colostomy output; WOC RN following - Monitor leukocytosis; resolved - Maintain foley catheter; monitor UO; d/w with urology, recommend leaving x1 month from date of surgery and performing cystogram prior to removal consideration - Midline wound care: Wound vac placed with WOC RN; MWF schedule as amenable  - Disposition plan for LTACH at this time  -- Edison Simon, PA-C [facility] 03/19/2021, 7:46 AM [phone] M-F: 7am - 4pm

## 2021-03-19 NOTE — TOC Transition Note (Signed)
Transition of Care [facility]) - CM/SW Discharge Note   Patient Details  Name: Desiree Madden MRN: [id number] Date of Birth: 05-14-1951  Transition of Care [facility]) CM/SW Contact:  Adelene Amas, [place] Phone Number: 03/19/2021, 12:53 PM   Clinical Narrative:     Attending signed Expedited Insurance Appeal for [facility] placement.  CSW emailed signed form to [place] at [employer].        Patient Goals and CMS Choice        Discharge Placement                       Discharge Plan and Services                                     Social Determinants of Health (SDOH) Interventions     Readmission Risk Interventions No flowsheet data found.

## 2021-03-19 NOTE — Consult Note (Addendum)
[place] Nurse wound follow up Vac dressing change performed.  Wound type: Abd with full thickness post-op wound to midline; is evolving to 2 separate wounds to upper and lower abd with narrow bridge of skin in between. Beefy red with yellow adipose scattered throughout, small amt pink drainage, no odor Pt declined meds when bedside nurse offered and tolerated with minimal amt apparent discomfort. Applied barrier ring to creases surrounding wound at 3:00 o'clock and 9:00 o'clock and also 5:00 o'clock to 7:00 o'clock, then one piece black foam to 124mm cont suction.  Wound is located in close proximity to the ostomy and may be difficult to maintain a seal. WOC team will change dressing Q M/W/F.  Pt was previously noted to have a dark reddish purple deep tissue pressure injury to the buttocks; this has resolved and is no longer present.  [place] Nurse ostomy follow up Stoma type/location:LMQ colostomy Stomal assessment/size:1 and 5/8 inches round, red, edematous, raised and slightly long (2cm).  There is a crease at 9:00 o'clock to the stoma and another deep crease below stoma; applied barrier ring to attempt to maintain a seal.  Output: mod amt liquid brown stool Ostomy pouching: 2pc.2 and 3/4 inch pouching system with skin barrier ring Education provided:No family present and patient is critically ill. [place] team will begin educational sessions when pt is stable and out of ICU. Enrolled patient in [place] Start Discharge program: Not yet Julien Girt MSN, RN, CWOCN, [employer], CNS

## 2021-03-20 ENCOUNTER — Inpatient Hospital Stay: Payer: Medicare HMO

## 2021-03-20 DIAGNOSIS — K5792 Diverticulitis of intestine, part unspecified, without perforation or abscess without bleeding: Secondary | ICD-10-CM | POA: Diagnosis not present

## 2021-03-20 LAB — GLUCOSE, CAPILLARY
Glucose-Capillary: 137 mg/dL — ABNORMAL HIGH (ref 70–99)
Glucose-Capillary: 197 mg/dL — ABNORMAL HIGH (ref 70–99)
Glucose-Capillary: 125 mg/dL — ABNORMAL HIGH (ref 70–99)
Glucose-Capillary: 127 mg/dL — ABNORMAL HIGH (ref 70–99)
Glucose-Capillary: 119 mg/dL — ABNORMAL HIGH (ref 70–99)
Glucose-Capillary: 139 mg/dL — ABNORMAL HIGH (ref 70–99)

## 2021-03-20 LAB — CBC
HCT: 27.4 % — ABNORMAL LOW (ref 36.0–46.0)
Hemoglobin: 8.8 g/dL — ABNORMAL LOW (ref 12.0–15.0)
MCH: 30.9 pg (ref 26.0–34.0)
MCHC: 32.1 g/dL (ref 30.0–36.0)
MCV: 96.1 fL (ref 80.0–100.0)
Platelets: 174 10*3/uL (ref 150–400)
RBC: 2.85 MIL/uL — ABNORMAL LOW (ref 3.87–5.11)
RDW: 17.4 % — ABNORMAL HIGH (ref 11.5–15.5)
WBC: 7.5 10*3/uL (ref 4.0–10.5)
nRBC: 0 % (ref 0.0–0.2)

## 2021-03-20 LAB — BASIC METABOLIC PANEL
Anion gap: 9 (ref 5–15)
BUN: 65 mg/dL — ABNORMAL HIGH (ref 8–23)
CO2: 20 mmol/L — ABNORMAL LOW (ref 22–32)
Calcium: 8.4 mg/dL — ABNORMAL LOW (ref 8.9–10.3)
Chloride: 110 mmol/L (ref 98–111)
Creatinine, Ser: 0.79 mg/dL (ref 0.44–1.00)
GFR, Estimated: >60 mL/min (ref >60)
Glucose, Bld: 146 mg/dL — ABNORMAL HIGH (ref 70–99)
Potassium: 4.1 mmol/L (ref 3.5–5.1)
Sodium: 139 mmol/L (ref 135–145)

## 2021-03-20 LAB — PHOSPHORUS: Phosphorus: 3.9 mg/dL (ref 2.5–4.6)

## 2021-03-20 LAB — MAGNESIUM: Magnesium: 1.8 mg/dL (ref 1.7–2.4)

## 2021-03-20 MED ORDER — MAGNESIUM SULFATE 2 GM/50ML IV SOLN
2.0000 g | Freq: Once | INTRAVENOUS | Status: AC
  Administered 2021-03-20: 2 g via INTRAVENOUS
  Filled 2021-03-20: qty 50

## 2021-03-20 MED ORDER — PIVOT 1.5 CAL PO LIQD
1000.0000 mL | ORAL | Status: DC
  Administered 2021-03-20: 1000 mL

## 2021-03-20 NOTE — Progress Notes (Signed)
PROGRESS NOTE    Desiree Madden  YTK:[id number] DOB: 22-May-1951 DOA: 02/21/2021 PCP: [facility]     Brief Narrative:  Desiree Madden is a 70 yo female who was admitted on 02/21/21 with chief complaint of abdominal pain. He has medical history significant for colon-bladder fistula, diverticulitis, HTN, hypothyroidism, HLD, UC. On 3/24, she developed complicated diverticulitis with perforation and underwent sigmoid colectomy with end colostomy and Hartman's procedure.  Postoperatively, patient was admitted to the ICU, remained intubated, required multiple vasopressors due to septic shock.  Significant hospital events listed below:  3/16/2022Admitted to Med/Surg unit with Diverticulitis and UTI 3/23 LLQ abdominal drain placed by IR to pelvic abscess 3/24Worsening Leukocytosis despite drain placement. Found to have complicated Diverticulitis with perforation requiring Sigmoid Colectomy with end colostomy and Hartman's Procedure. Post procedure she returns to ICU and remains intubated with Septic Shock requiring multiple vasopressors. Left Femoral CVC and Arterial lines placed emergently 3/27Severe anasarca, albumin 1.8 DX of NSTEMI 3/28 Failure to wean from vent 3/29 Severe agitation, septic shock on pressors 3/29 Failed extubation, re-intubated  3/30 ENT consulted 3/31 Trach placed, daughter updated, made DNR status 4/1 Colostomy revision, remains on vent, daughter updated 4/2 Failed weaning trials  4/4 Patient is on 5/5 back to put her on SAT SBT still minimally responsive and now looking for an LTAC bed. 4/5 Surgical clips removed patient fascia is open but muscles are closed needs twice a day wet-to-dry packing 4/6No major events overnight patient had beta-blocker restarted and here episodes of V. tach clearance has decreased. 4/7 Failure to wean from vent due to muscle fatigue and ischemic cardiomyopathy 4/8 Tolerating TCT for 24 hrs, off vent 4/9 Tolerating trach collar  transitioned to stepdown 4/11 More alert, transfer to progressive unit  4/12 Report from RT, found white substance in her inner trach cannula. Concern for aspiration vs cortrak dislodgement. Xray unremarkable.   New events last 24 hours / Subjective: Patient's mentation remains about the same, alert to voice and nods and shakes head to certain questions. Does not participate in much exam   Assessment & Plan:   Principal Problem:   Diverticulitis Active Problems:   Fistula, bladder   Ulcerative colitis ([place])   Hyperlipidemia   Essential hypertension   Hypothyroidism   Abnormal ECG   CKD (chronic kidney disease), stage III (HCC)   Elevated lactic acid level   Protein-calorie malnutrition, severe   Septic shock, hemorrhagic shock secondary to complicated diverticulitis, perforation, pelvic abscesses, gangrene of the sigmoid colon status post sigmoid colectomy, end colostomy 3/24 -Shock resolved and off pressors, remains on midodrine  -ID recommended 10 day course antibiotics, completed 4/7  -Wound VAC in place, colostomy bag in place -General surgery following  -TF ongoing, possibly may need PEG tube if continues to fail speech eval   Acute hypoxemic respiratory failure -Failure to wean from the vent and ultimately had trach placed on 3/31 -Remains on trach collar presently  SVT Hx A Fib Sinus tachycardia -Continue amiodarone -Cardiology following   NSTEMI, ischemic cardiomyopathy -Echo showed EF 20-25% with grade 1 diastolic dysfunction -Cardiology following   Acute urinary retention -Urology consulted, recommended Foley catheter for another 1 or 2 weeks, and cystogram prior to Foley removal.  See Dr. Doristine Counter note dated 4/8  Hypothyroidism -Continue synthroid     DVT prophylaxis:  enoxaparin (LOVENOX) injection 40 mg Start: 03/13/21 1800 SCDs Start: 02/21/21 2200  Code Status: DNR Family Communication: None at bedside  Disposition Plan:  Status is:  Inpatient  Remains inpatient appropriate because:Unsafe d/c plan, IV treatments appropriate due to intensity of illness or inability to take PO and Inpatient level of care appropriate due to severity of illness   Dispo: The patient is from: Home              Anticipated d/c is to: LTAC              Patient currently is not medically stable to d/c.  Remains on tube feeds through cortrak.  Continue speech evaluation, need to consider PEG   Difficult to place patient No   Antimicrobials:  Anti-infectives (From admission, onward)   Start     Dose/Rate Route Frequency Ordered Stop   03/13/21 1400  meropenem (MERREM) 1 g in sodium chloride 0.9 % 100 mL IVPB        1 g 200 mL/hr over 30 Minutes Intravenous Every 8 hours 03/13/21 0855 03/16/21 2203   03/12/21 1800  meropenem (MERREM) 1 g in sodium chloride 0.9 % 100 mL IVPB  Status:  Discontinued        1 g 200 mL/hr over 30 Minutes Intravenous Every 12 hours 03/12/21 0840 03/13/21 0855   03/10/21 1000  meropenem (MERREM) 1 g in sodium chloride 0.9 % 100 mL IVPB  Status:  Discontinued        1 g 200 mL/hr over 30 Minutes Intravenous Every 8 hours 03/10/21 0854 03/12/21 0840   03/09/21 0600  fluconazole (DIFLUCAN) IVPB 200 mg  Status:  Discontinued        200 mg 100 mL/hr over 60 Minutes Intravenous Every 24 hours 03/08/21 0751 03/14/21 1014   03/08/21 2200  meropenem (MERREM) 1 g in sodium chloride 0.9 % 100 mL IVPB  Status:  Discontinued        1 g 200 mL/hr over 30 Minutes Intravenous Every 12 hours 03/08/21 0748 03/10/21 0854   03/08/21 0600  fluconazole (DIFLUCAN) IVPB 400 mg  Status:  Discontinued        400 mg 100 mL/hr over 120 Minutes Intravenous Every 24 hours 03/07/21 1321 03/08/21 0839   03/07/21 1100  meropenem (MERREM) 1 g in sodium chloride 0.9 % 100 mL IVPB  Status:  Discontinued        1 g 200 mL/hr over 30 Minutes Intravenous Every 8 hours 03/07/21 1013 03/08/21 0748   03/07/21 1000  meropenem (MERREM) 1 g in sodium  chloride 0.9 % 100 mL IVPB  Status:  Discontinued        1 g 200 mL/hr over 30 Minutes Intravenous Every 12 hours 03/06/21 2243 03/07/21 1013   03/07/21 0600  fluconazole (DIFLUCAN) IVPB 400 mg        400 mg 100 mL/hr over 120 Minutes Intravenous  Once 03/06/21 2243 03/07/21 0701   03/04/21 1800  vancomycin (VANCOREADY) IVPB 750 mg/150 mL        750 mg 150 mL/hr over 60 Minutes Intravenous Every 24 hours 03/03/21 1442 03/06/21 2040   03/03/21 0800  vancomycin (VANCOREADY) IVPB 1500 mg/300 mL  Status:  Discontinued        1,500 mg 150 mL/hr over 120 Minutes Intravenous Every 48 hours 03/02/21 1410 03/03/21 1442   03/02/21 0800  vancomycin (VANCOREADY) IVPB 750 mg/150 mL  Status:  Discontinued        750 mg 150 mL/hr over 60 Minutes Intravenous Every 24 hours 03/01/21 1954 03/02/21 1409   03/01/21 2200  ceFEPIme (MAXIPIME) 2 g in sodium chloride 0.9 % 100 mL  IVPB        2 g 200 mL/hr over 30 Minutes Intravenous Every 12 hours 03/01/21 1903 03/06/21 2203   03/01/21 2100  vancomycin (VANCOREADY) IVPB 1250 mg/250 mL        1,250 mg 166.7 mL/hr over 90 Minutes Intravenous  Once 03/01/21 1954 03/02/21 0008   03/01/21 2000  metroNIDAZOLE (FLAGYL) IVPB 500 mg        500 mg 100 mL/hr over 60 Minutes Intravenous Every 8 hours 03/01/21 1903 03/06/21 2002   02/28/21 1800  piperacillin-tazobactam (ZOSYN) IVPB 3.375 g  Status:  Discontinued        3.375 g 12.5 mL/hr over 240 Minutes Intravenous Every 8 hours 02/28/21 1643 03/01/21 1903   02/28/21 1730  piperacillin-tazobactam (ZOSYN) IVPB 3.375 g  Status:  Discontinued        3.375 g 100 mL/hr over 30 Minutes Intravenous Every 8 hours 02/28/21 1635 02/28/21 1642   02/28/21 1300  Ampicillin-Sulbactam (UNASYN) 3 g in sodium chloride 0.9 % 100 mL IVPB  Status:  Discontinued        3 g 200 mL/hr over 30 Minutes Intravenous Every 6 hours 02/28/21 1156 02/28/21 1635   02/23/21 1230  piperacillin-tazobactam (ZOSYN) IVPB 3.375 g  Status:  Discontinued         3.375 g 12.5 mL/hr over 240 Minutes Intravenous Every 8 hours 02/23/21 1143 02/28/21 1155   02/22/21 0600  ciprofloxacin (CIPRO) IVPB 400 mg  Status:  Discontinued        400 mg 200 mL/hr over 60 Minutes Intravenous Every 12 hours 02/21/21 2028 02/23/21 1143   02/22/21 0400  metroNIDAZOLE (FLAGYL) IVPB 500 mg  Status:  Discontinued        500 mg 100 mL/hr over 60 Minutes Intravenous Every 8 hours 02/21/21 2028 02/23/21 1143   02/21/21 1830  ciprofloxacin (CIPRO) IVPB 400 mg        400 mg 200 mL/hr over 60 Minutes Intravenous  Once 02/21/21 1820 02/21/21 1939   02/21/21 1830  metroNIDAZOLE (FLAGYL) IVPB 500 mg        500 mg 100 mL/hr over 60 Minutes Intravenous  Once 02/21/21 1820 02/21/21 2110       Objective: Vitals:   03/20/21 0700 03/20/21 0800 03/20/21 0900 03/20/21 1000  BP: 140/77 (!) 144/81 94/67 125/72  Pulse: (!) 113 (!) 116 (!) 101 (!) 108  Resp:      Temp:      TempSrc:      SpO2: 98% 98% 98% 99%  Weight:      Height:        Intake/Output Summary (Last 24 hours) at 03/20/2021 1318 Last data filed at 03/20/2021 1048 Gross per 24 hour  Intake --  Output 495 ml  Net -495 ml   Filed Weights   02/27/21 0900 02/28/21 1014 03/01/21 1511  Weight: 66.9 kg 67.2 kg 67.2 kg    Examination: General exam: Appears calm and comfortable  Respiratory system: Clear to auscultation. Respiratory effort normal.  Trach collar in place Cardiovascular system: S1 & S2 heard, tachycardic, regular rhythm. No pedal edema. Gastrointestinal system: Abdomen is nondistended, soft  Central nervous system: Alert to voice Extremities: Symmetric in appearance bilaterally    Data Reviewed: I have personally reviewed following labs and imaging studies  CBC: Recent Labs  Lab 03/14/21 0420 03/15/21 0446 03/16/21 0525 03/17/21 0500 03/18/21 0627 03/19/21 0525 03/20/21 0530  WBC 12.8* 10.0 9.4 8.4 8.5 8.0 7.5  NEUTROABS 10.6* 8.2* 7.0 6.3 6.6  --   --  HGB 8.9* 9.6* 10.1* 10.0*  9.8* 9.7* 8.8*  HCT 28.5* 29.8* 31.4* 30.7* 30.4* 30.4* 27.4*  MCV 96.9 97.1 95.2 94.8 95.3 95.3 96.1  PLT 213 215 221 204 192 186 335   Basic Metabolic Panel: Recent Labs  Lab 03/16/21 0525 03/17/21 0500 03/18/21 0627 03/19/21 0525 03/20/21 0530  NA 148* 144 144 142 139  K 3.2* 4.2 3.7 4.4 4.1  CL 115* 116* 117* 114* 110  CO2 24 22 22 22  20*  GLUCOSE 153* 145* 156* 139* 146*  BUN 39* 43* 50* 59* 65*  CREATININE 0.67 0.71 0.82 0.87 0.79  CALCIUM 8.0* 8.1* 8.4* 8.5* 8.4*  MG 1.8 2.0 1.9 2.2 1.8  PHOS 2.9 2.7 3.5 3.3 3.9   GFR: Estimated Creatinine Clearance: 61.7 mL/min (by C-G formula based on SCr of 0.79 mg/dL). Liver Function Tests: Recent Labs  Lab 03/14/21 0420 03/15/21 0446 03/16/21 0525  AST 309* 170* 112*  ALT 163* 129* 94*  ALKPHOS 85 89 86  BILITOT 0.7 0.8 0.5  PROT 5.6* 5.7* 5.7*  ALBUMIN 2.3* 2.4* 2.4*   No results for input(s): LIPASE, AMYLASE in the last 168 hours. No results for input(s): AMMONIA in the last 168 hours. Coagulation Profile: No results for input(s): INR, PROTIME in the last 168 hours. Cardiac Enzymes: No results for input(s): CKTOTAL, CKMB, CKMBINDEX, TROPONINI in the last 168 hours. BNP (last 3 results) No results for input(s): PROBNP in the last 8760 hours. HbA1C: No results for input(s): HGBA1C in the last 72 hours. CBG: Recent Labs  Lab 03/19/21 2046 03/19/21 2318 03/20/21 0425 03/20/21 0821 03/20/21 1132  GLUCAP 145* 176* 137* 197* 125*   Lipid Profile: No results for input(s): CHOL, HDL, LDLCALC, TRIG, CHOLHDL, LDLDIRECT in the last 72 hours. Thyroid Function Tests: No results for input(s): TSH, T4TOTAL, FREET4, T3FREE, THYROIDAB in the last 72 hours. Anemia Panel: No results for input(s): VITAMINB12, FOLATE, FERRITIN, TIBC, IRON, RETICCTPCT in the last 72 hours. Sepsis Labs: No results for input(s): PROCALCITON, LATICACIDVEN in the last 168 hours.  No results found for this or any previous visit (from the past 240  hour(s)).    Radiology Studies: DG Abd 1 View  Result Date: 03/20/2021 CLINICAL DATA:  Check feeding catheter placement EXAM: ABDOMEN - 1 VIEW COMPARISON:  03/17/2021 FINDINGS: Gastric catheter is noted coiled within the stomach with the tip directed towards the fundus. The overall appearance is relatively stable from the prior exam. Ostomy is noted in the left mid abdomen. No free air is seen. No bony abnormality is noted. IMPRESSION: Gastric catheter coiled within the stomach as described. Electronically Signed   By: Inez Catalina M.D.   On: 03/20/2021 12:39   DG Chest Port 1 View  Result Date: 03/20/2021 CLINICAL DATA:  Possible aspiration EXAM: PORTABLE CHEST 1 VIEW COMPARISON:  03/18/2021 FINDINGS: Cardiac shadow is stable. Postsurgical changes are again seen. Tracheostomy tube and gastric catheter are noted in satisfactory position. Right-sided PICC line is noted at the cavoatrial junction. The lungs are well aerated bilaterally. Mild atelectasis is again seen in the right base. No new focal infiltrate is seen. IMPRESSION: Stable appearance when compare with the prior exam. No findings to suggest aspiration are seen. Electronically Signed   By: Inez Catalina M.D.   On: 03/20/2021 12:38      Scheduled Meds: . amiodarone  200 mg Per Tube BID  . chlorhexidine gluconate (MEDLINE KIT)  15 mL Mouth Rinse BID  . Chlorhexidine Gluconate Cloth  6 each Topical  Daily  . docusate  100 mg Per Tube BID  . enoxaparin (LOVENOX) injection  40 mg Subcutaneous Q24H  . free water  100 mL Per Tube Q4H  . insulin aspart  0-20 Units Subcutaneous Q4H  . levothyroxine  75 mcg Per Tube Q0600  . mouth rinse  15 mL Mouth Rinse 10 times per day  . midodrine  10 mg Per Tube TID WC  . multivitamin with minerals  1 tablet Per Tube Daily  . nutrition supplement (JUVEN)  1 packet Per Tube BID BM  . pantoprazole (PROTONIX) IV  40 mg Intravenous Daily  . polyethylene glycol  17 g Per Tube Daily  . sodium chloride  flush  10-40 mL Intracatheter Q12H  . sodium chloride flush  5 mL Intracatheter Q8H   Continuous Infusions: . sodium chloride 250 mL (03/20/21 1025)  . feeding supplement (VITAL AF 1.2 CAL) 1,000 mL (03/19/21 0300)     LOS: 27 days      Time spent: 25 minutes   Dessa Phi, DO [facility] 03/20/2021, 1:18 PM   Available via Epic secure chat 7am-7pm After these hours, please refer to coverage provider listed on [email or web address]

## 2021-03-20 NOTE — Progress Notes (Signed)
Nutrition Follow-up  DOCUMENTATION CODES:   Severe malnutrition in context of acute illness/injury  INTERVENTION:   Change to Pivot 1.5 @50ml /hr    Free water flushes 137m q4 hours per MD   Regimen provides 1800kcal/day, 113g/day protein and 15166mday free water  Juven Fruit Punch BID, each serving provides 95kcal and 2.5g of protein (amino acids glutamine and arginine)  NUTRITION DIAGNOSIS:   Severe Malnutrition related to acute illness as evidenced by moderate fat depletion,moderate muscle depletion,severe muscle depletion. -ongoing   GOAL:   Patient will meet greater than or equal to 90% of their needs  -met with tube feeds   MONITOR:   Diet advancement,Labs,Weight trends,TF tolerance,Skin,I & O's  ASSESSMENT:   [age].o. female with h/o HTN, CKD III, colovesical fistula, hypothroidism and HLD who is admitted with diverticulitis and constipation.   -Pt s/p sigmoid colectomy with end colostomy, Hartman's procedure, splenic flexure mobilization and fistula repair 3/24 -Pt s/p tracheostomy and NGT 3/31 -Pt s/p ostomy revision and maturation 4/1  Pt tolerating tube feeds at goal rate via NGT. Refeed labs stable. VAC in place on midline wound. Pt transferred to progressive care unit. Discussions are being held about G-tube placement. SLP continues to evaluate for diet. Will change to Pivot tube feed formula to support wound healing. No new weight since 3/24; will request daily weights.   Medications reviewed and include: colace, lovenox, insulin, synthroid, MVI, protonix, miralax  Labs reviewed: Na 139 wnl, K 4.1 wnl, BUN 65(H), P 3.9 wnl, Mg 1.8 wnl Hgb 8.8(L), Hct 27.4(L) cbgs- 197, 125 x 24 hrs  Diet Order:    Diet Order            Diet NPO time specified  Diet effective now                EDUCATION NEEDS:   Not appropriate for education at this time  Skin:  Incisions: Midline wound 15X8X7cm with VAC  Last BM:  4/12- 7077mia ostomy  Height:   Ht  Readings from Last 1 Encounters:  03/14/21 5' 4.02" (1.626 m)    Weight:   Wt Readings from Last 1 Encounters:  03/01/21 67.2 kg    Ideal Body Weight:  54.5 kg  BMI:  Body mass index is 25.42 kg/m.  Estimated Nutritional Needs:   Kcal:  1700-1900kcal/day  Protein:  100-115g/day  Fluid:  1.4-1.7L/day  CasKoleen Distance, RD, LDN Please refer to [facility] and/or RD on-call/weekend/after hours pager

## 2021-03-20 NOTE — Progress Notes (Signed)
[place] for Electrolyte Monitoring and Replacement   Recent Labs: Potassium (mmol/L)  Date Value  03/20/2021 4.1   Magnesium (mg/dL)  Date Value  03/20/2021 1.8   Calcium (mg/dL)  Date Value  03/20/2021 8.4 (L)   Albumin (g/dL)  Date Value  03/16/2021 2.4 (L)   Phosphorus (mg/dL)  Date Value  03/20/2021 3.9   Sodium (mmol/L)  Date Value  03/20/2021 139   Assessment: Desiree Madden is a 70 y.o. female admitted on 02/21/2021 with intra-abdominal infection (diverticulitis w/ fecal perforation). Patient septic with acute diverticulitis abdominal abscess with colovesical fistula and enterococcus urinary tract infection which was further complicated by the development of complicated diverticulitis with perforation and gangrenous changes requiring Hartman's procedure performed on 03/01/21. PMH includes CAD s/p CABG (2015), HTN, hypothyroidism, colon bladder fistula, diverticulitis, and ulcerative colitis. Pharmacy was consulted for electrolyte monitoring.  Nutrition: Continous tube feeds @ 65 mL/hr    Free water 100 mL per tube every 4 hours  Goal of Therapy:  K 4.0 - 5.1 mmol/L Mg 2.0 - 2.4 mg/dL All Other Electrolytes WNL  Plan:   Mg 1.8 will order IV magnesium sulfate 2 g x 1  Sodium continues to trend down:  Continue free water to 100 mL per tube every 4 hours  Re-check electrolytes with AM labs  Desiree Madden 03/20/2021 9:42 AM

## 2021-03-20 NOTE — Progress Notes (Incomplete)
Patient Name: Desiree Madden Date of Encounter: 03/20/2021  Hospital Problem List     Principal Problem:   Diverticulitis Active Problems:   Fistula, bladder   Ulcerative colitis ([place])   Hyperlipidemia   Essential hypertension   Hypothyroidism   Abnormal ECG   CKD (chronic kidney disease), stage III (HCC)   Elevated lactic acid level   Protein-calorie malnutrition, severe    Patient Profile      Patient is a 70 year old female admitted on March 16 of 2022 with diverticulitis. She underwent colostomy and Hartman's procedure. She was diagnosed with septic shock. Has had prolonged intubation followed by trach placement. Had a colostomy revision on 4 1. Has had episodes of wide-complex tachycardia. Currently controlled with amiodarone 200 mg twice daily per tube. Is on midodrine for soft blood pressure. Is on broad-spectrum antibiotics..  Subjective   Lethargic but slightly more alert  Inpatient Medications    . amiodarone  200 mg Per Tube BID  . chlorhexidine gluconate (MEDLINE KIT)  15 mL Mouth Rinse BID  . Chlorhexidine Gluconate Cloth  6 each Topical Daily  . docusate  100 mg Per Tube BID  . enoxaparin (LOVENOX) injection  40 mg Subcutaneous Q24H  . free water  100 mL Per Tube Q4H  . insulin aspart  0-20 Units Subcutaneous Q4H  . levothyroxine  75 mcg Per Tube Q0600  . mouth rinse  15 mL Mouth Rinse 10 times per day  . midodrine  10 mg Per Tube TID WC  . multivitamin with minerals  1 tablet Per Tube Daily  . nutrition supplement (JUVEN)  1 packet Per Tube BID BM  . pantoprazole (PROTONIX) IV  40 mg Intravenous Daily  . polyethylene glycol  17 g Per Tube Daily  . sodium chloride flush  10-40 mL Intracatheter Q12H  . sodium chloride flush  5 mL Intracatheter Q8H    Vital Signs    Vitals:   03/20/21 0700 03/20/21 0800 03/20/21 0900 03/20/21 1000  BP: 140/77 (!) 144/81 94/67 125/72  Pulse: (!) 113 (!) 116 (!) 101 (!) 108  Resp:      Temp:      TempSrc:       SpO2: 98% 98% 98% 99%  Weight:      Height:        Intake/Output Summary (Last 24 hours) at 03/20/2021 1354 Last data filed at 03/20/2021 1048 Gross per 24 hour  Intake -  Output 495 ml  Net -495 ml   Filed Weights   02/27/21 0900 02/28/21 1014 03/01/21 1511  Weight: 66.9 kg 67.2 kg 67.2 kg    Physical Exam    GEN: Well nourished, well developed, in no acute distress.  HEENT: normal.  Neck: Supple, no JVD, carotid bruits, or masses. Cardiac: RRR, no murmurs, rubs, or gallops. No clubbing, cyanosis, edema.  Radials/DP/PT 2+ and equal bilaterally.  Respiratory:  Respirations regular and unlabored, clear to auscultation bilaterally. GI: Soft, nontender, nondistended, BS + x 4. MS: no deformity or atrophy. Skin: warm and dry, no rash. Neuro:  Strength and sensation are intact. Psych: Normal affect.  Labs    CBC Recent Labs    03/18/21 0627 03/19/21 0525 03/20/21 0530  WBC 8.5 8.0 7.5  NEUTROABS 6.6  --   --   HGB 9.8* 9.7* 8.8*  HCT 30.4* 30.4* 27.4*  MCV 95.3 95.3 96.1  PLT 192 186 078   Basic Metabolic Panel Recent Labs    03/19/21 0525 03/20/21 0530  NA  142 139  K 4.4 4.1  CL 114* 110  CO2 22 20*  GLUCOSE 139* 146*  BUN 59* 65*  CREATININE 0.87 0.79  CALCIUM 8.5* 8.4*  MG 2.2 1.8  PHOS 3.3 3.9   Liver Function Tests No results for input(s): AST, ALT, ALKPHOS, BILITOT, PROT, ALBUMIN in the last 72 hours. No results for input(s): LIPASE, AMYLASE in the last 72 hours. Cardiac Enzymes No results for input(s): CKTOTAL, CKMB, CKMBINDEX, TROPONINI in the last 72 hours. BNP No results for input(s): BNP in the last 72 hours. D-Dimer No results for input(s): DDIMER in the last 72 hours. Hemoglobin A1C No results for input(s): HGBA1C in the last 72 hours. Fasting Lipid Panel No results for input(s): CHOL, HDL, LDLCALC, TRIG, CHOLHDL, LDLDIRECT in the last 72 hours. Thyroid Function Tests No results for input(s): TSH, T4TOTAL, T3FREE, THYROIDAB in the  last 72 hours.  Invalid input(s): FREET3  Telemetry    ***  ECG    ***  Radiology    DG Abd 1 View  Result Date: 03/20/2021 CLINICAL DATA:  Check feeding catheter placement EXAM: ABDOMEN - 1 VIEW COMPARISON:  03/17/2021 FINDINGS: Gastric catheter is noted coiled within the stomach with the tip directed towards the fundus. The overall appearance is relatively stable from the prior exam. Ostomy is noted in the left mid abdomen. No free air is seen. No bony abnormality is noted. IMPRESSION: Gastric catheter coiled within the stomach as described. Electronically Signed   By: Inez Catalina M.D.   On: 03/20/2021 12:39   DG Abd 1 View  Result Date: 03/17/2021 CLINICAL DATA:  NG tube placement. EXAM: ABDOMEN - 1 VIEW COMPARISON:  Earlier same day FINDINGS: NG tube is positioned in the proximal stomach with the proximal side port in the gastric fundus. Nonspecific bowel gas pattern evident. IMPRESSION: NG tube tip is in the stomach. Electronically Signed   By: Misty Stanley M.D.   On: 03/17/2021 17:51   DG Abd 1 View  Result Date: 03/17/2021 CLINICAL DATA:  Enteric tube placement. EXAM: ABDOMEN - 1 VIEW COMPARISON:  Abdominal x-ray dated March 08, 2021. FINDINGS: Enteric tube in the stomach with the tip at the fundus. The bowel gas pattern is normal. No radio-opaque calculi or other significant radiographic abnormality are seen. IMPRESSION: 1. Enteric tube in the stomach. Electronically Signed   By: Titus Dubin M.D.   On: 03/17/2021 14:17   DG Abd 1 View  Result Date: 03/08/2021 CLINICAL DATA:  NG tube placement. EXAM: ABDOMEN - 1 VIEW COMPARISON:  03/08/2021. FINDINGS: NG tube tip coiled in the stomach. No gastric or bowel distention. Ostomy noted over the left lower quadrant. Drainage catheter noted left lower quadrant. Prior CABG. IMPRESSION: NG tube noted with tip coiled in the stomach. Electronically Signed   By: Marcello Moores  Register   On: 03/08/2021 14:00   DG Abd 1 View  Result Date:  03/08/2021 CLINICAL DATA:  Nasogastric tube placement. EXAM: ABDOMEN - 1 VIEW COMPARISON:  None. FINDINGS: The bowel gas pattern is normal. Nasogastric tube tip is seen in proximal stomach. Colostomy is noted in left lower quadrant. No radio-opaque calculi or other significant radiographic abnormality are seen. IMPRESSION: Nasogastric tube tip seen in proximal stomach. No evidence of bowel obstruction or ileus. Electronically Signed   By: Marijo Conception M.D.   On: 03/08/2021 11:04   DG Abd 1 View  Result Date: 03/01/2021 CLINICAL DATA:  OG tube placement EXAM: ABDOMEN - 1 VIEW COMPARISON:  02/23/2021 FINDINGS:  OG tube tip is in the stomach. Nonobstructive bowel gas pattern. No free air. IMPRESSION: OG tube tip in the stomach. Electronically Signed   By: Rolm Baptise M.D.   On: 03/01/2021 19:27   CT ABDOMEN PELVIS W CONTRAST  Result Date: 02/28/2021 CLINICAL DATA:  Left lower quadrant abdominal pain. History diverticulitis. EXAM: CT ABDOMEN AND PELVIS WITH CONTRAST TECHNIQUE: Multidetector CT imaging of the abdomen and pelvis was performed using the standard protocol following bolus administration of intravenous contrast. CONTRAST:  53m OMNIPAQUE IOHEXOL 300 MG/ML  SOLN COMPARISON:  CT scan 02/24/2021 FINDINGS: Lower chest: Margins persistent small bilateral pleural effusions with overlying atelectasis. The heart is normal in size. No pericardial effusion. Stable aortic and advanced coronary artery calcifications. Surgical changes from coronary artery bypass surgery. The distal esophagus is grossly normal. Hepatobiliary: No biliary no hepatic lesions or intrahepatic biliary dilatation. The gallbladder is unremarkable. Persistent perihepatic fluid. No common bile duct dilatation. Pancreas: No mass, inflammation or ductal dilatation. Spleen: Normal size.  No focal lesions. Adrenals/Urinary Tract: The adrenal glands are normal. No renal lesions or hydroureteronephrosis. There is a large amount of gas in the  bladder most likely from recent catheterization. Stomach/Bowel: The stomach, duodenum and small bowel are unremarkable. No acute inflammatory process or obstructive findings. Contrast gets all the way to the colon. Diffuse scattered colonic diverticulosis. Areas of colonic inflammation noted involving the descending colon and almost the entire sigmoid colon with marked wall thickening and submucosal edema. There is a new pericolonic abscess with a possible intramural component but definite extramural component in the left pelvis measuring a maximum of 8.3 x 4.7 cm. It appears to contain a small amount of dependent contrast material suggesting active extravasation. Vascular/Lymphatic: Stable advanced atherosclerotic calcifications involving the aorta and iliac arteries. No aneurysm. The major venous structures are patent. There are numerous scattered borderline retroperitoneal lymph nodes which are likely inflammatory/reactive. Reproductive: The uterus and ovaries are grossly normal in stable. Other: Diffuse subcutaneous soft tissue swelling/edema suggesting anasarca. There is also scattered free abdominal and free pelvic fluid. Musculoskeletal: If no significant bony findings. IMPRESSION: 1. Persistent changes of acute severe diverticulitis involving the descending colon and almost the entire sigmoid colon. 2. New pericolonic abscess measuring a maximum of 8.3 x 4.7 cm. It appears to contain a small amount of dependent contrast material suggesting active extravasation. 3. Persistent small bilateral pleural effusions with overlying atelectasis. 4. Persistent perihepatic and free pelvic fluid. 5. Diffuse subcutaneous soft tissue swelling/edema suggesting anasarca. 6. Stable advanced atherosclerotic calcifications involving the aorta and iliac arteries. 7. Large amount of gas in the bladder most likely from recent catheterization. 8. Aortic atherosclerosis. These results will be called to the ordering clinician or  representative by the Radiologist Assistant, and communication documented in the PACS or [employer] Aortic Atherosclerosis (ICD10-I70.0). Electronically Signed   By: PMarijo SanesM.D.   On: 02/28/2021 13:43   CT ABDOMEN PELVIS W CONTRAST  Result Date: 02/24/2021 CLINICAL DATA:  Lower abdominal and pelvic pain EXAM: CT ABDOMEN AND PELVIS WITH CONTRAST TECHNIQUE: Multidetector CT imaging of the abdomen and pelvis was performed using the standard protocol following bolus administration of intravenous contrast. CONTRAST:  746mOMNIPAQUE IOHEXOL 300 MG/ML  SOLN COMPARISON:  02/21/2021 FINDINGS: Lower chest: New atelectasis in the lower lobes and lingula. Trace bilateral pleural effusions are likewise new. Prior CABG. Coronary and aortic atherosclerosis. Contrast medium in the distal esophagus suggest dysmotility or reflux. Hepatobiliary: Borderline distended gallbladder. No biliary dilatation. No focal hepatic parenchymal  lesion identified. Pancreas: Unremarkable Spleen: Unremarkable Adrenals/Urinary Tract: Both adrenal glands appear within normal limits. Borderline right hydroureter extending to the iliac vessel cross over with the ureters obscured by surrounding inflammatory findings. No hydronephrosis or delay in excretion of contrast medium. Small amount of left lateral perinephric edema. Stomach/Bowel: Normal appendix. Descending and sigmoid colon diverticulosis with notably prominent caliber of the sigmoid colon, considerable paracolic stranding around the distal descending and sigmoid colon, and substantial localized wall thickening along a 9 cm segment of the distal sigmoid colon for example on image 70 of series 2 suspicious for progressive diverticulitis and likely some superimposed regional colitis. Air-fluid level in the rectum. Peripheral intermediate density in the cecum for example on image 51 of series 2 simulates wall thickening, but there is no wall thickening in this region on 02/21/2021  hence this appearance is due to isodense fluid rather than actual cecal wall thickening. Vascular/Lymphatic: Aortoiliac atherosclerotic vascular disease. Scattered likely reactive retroperitoneal lymph nodes including an index left periaortic node measuring 0.8 cm in short axis on image 48 series 2. Reproductive: Unremarkable Other: New perihepatic and perisplenic ascites along with fluid tracking along the paracolic gutter. New pelvic ascites. No free intraperitoneal gas is identified. I do not observe a discrete drainable abscess. Musculoskeletal: Unchanged compression fracture at T11. Lower lumbar spondylosis and degenerative disc disease. IMPRESSION: 1. Worsened prominent paracolic stranding around the distal descending and sigmoid colon, and substantial localized wall thickening along a 9 cm segment of the distal sigmoid colon suspicious for progressive diverticulitis and likely some superimposed regional colitis. No free intraperitoneal gas or discrete drainable abscess identified. 2. New perihepatic and perisplenic ascites along with fluid tracking along the paracolic gutters and pelvic ascites. 3. Trace bilateral pleural effusions with new atelectasis in the lower lobes and lingula. 4. Contrast medium in the distal esophagus suggest dysmotility or reflux. 5. Unchanged compression fracture at T11. 6. Borderline distended gallbladder. 7. Borderline right hydroureter extending to the iliac vessel cross over with the ureters obscured by surrounding inflammatory findings. No hydronephrosis or delay in excretion of contrast medium. 8. Aortic atherosclerosis. Aortic Atherosclerosis (ICD10-I70.0). Electronically Signed   By: Van Clines M.D.   On: 02/24/2021 13:58   CT ABDOMEN PELVIS W CONTRAST  Result Date: 02/21/2021 CLINICAL DATA:  Lower abdominal pain. History of diverticulitis. 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 EXAM: CT ABDOMEN AND PELVIS WITH CONTRAST TECHNIQUE:  Multidetector CT imaging of the abdomen and pelvis was performed using the standard protocol following bolus administration of intravenous contrast. CONTRAST:  52m OMNIPAQUE IOHEXOL 300 MG/ML  SOLN COMPARISON:  None. FINDINGS: Lower chest: No basilar consolidation or pleural effusion. Minimal posterior pleural thickening on the left. Coronary artery calcifications. Epicardial leads in place. Hepatobiliary: Minimal focal fatty infiltration adjacent to the gallbladder fossa. No suspicious hepatic lesion. No calcified gallstone or pericholecystic fat stranding. No biliary dilatation. Pancreas: No ductal dilatation or inflammation. Spleen: Normal in size without focal abnormality. Adrenals/Urinary Tract: Normal adrenal glands. Lobulated bilateral renal contours. Slight atrophy of left kidney. No hydronephrosis. No perinephric edema. Tiny cyst in the upper left kidney. No visualized renal stone. Symmetric excretion on delayed phase imaging. The urinary bladder is displaced into the left pelvis. There is a in bladder or fluid level. No perivesicular fat stranding. No definite bladder wall thickening. Loss of fat plane between the superior aspect of the urinary bladder and a sigmoid colonic diverticulum. Stomach/Bowel: Pericolonic fat stranding about the proximal sigmoid colon, series 2, image 62, in  the region of multiple colonic diverticula suspicious for diverticulitis. This fat stranding extends to the adjacent urinary bladder. There are numerable colonic diverticula from the splenic flexure distally. Large volume of colonic stool from the splenic flexure distally. Mild sigmoid colonic wall thickening which is nonspecific. There are scattered right colonic diverticula. Transverse colon is tortuous. Small hiatal hernia. Stomach is decompressed. No small bowel obstruction or inflammation. Normal air-filled appendix. Vascular/Lymphatic: Moderately advanced aortic atherosclerosis. Irregular plaque in the upper abdominal  aorta. Incidental 2 right and left renal arteries. No aneurysm. Calcified plaque throughout the left common iliac artery causes some degree of stenosis. Patent portal vein. No portal or mesenteric venous gas. Multiple small retroperitoneal lymph nodes, typically reactive. No bulky abdominopelvic adenopathy. Reproductive: Uterus remains in situ, atrophic and deviated to the left. No adnexal mass. Other: No free air or focal fluid collection. Minimal free fluid in the left pelvis. No abdominal wall hernia. Musculoskeletal: Chronic but progressive moderately severe T11 compression fracture. Increase vertebral body height loss since 11/23/2019 thoracic spine CT, particularly anteriorly. Hemi transitional lumbosacral anatomy with enlarged right transverse process and pseudoarticulation with the sacrum. Bones are subjectively under mineralized. IMPRESSION: 1. Advanced colonic diverticulosis with pericolonic fat stranding about the proximal sigmoid colon, in the region of multiple colonic diverticula, suspicious for acute diverticulitis. This fat stranding extends to the adjacent urinary bladder. 2. Fluid level in the urinary bladder. This may be due to Foley catheter/instrumentation, or fistula as provided history. Pericolonic edema extends to the anterior aspect of the bladder. 3. Large volume of colonic stool from the splenic flexure distally, suggesting constipation. There is wall thickening in the mid sigmoid colon that is nonspecific. Recommend up-to-date colonoscopy to exclude the possibility of colonic neoplasm. 4. Chronic but progressive moderately severe T11 compression fracture. Increase vertebral body height loss since 11/23/2019 thoracic spine CT, particularly anteriorly. Aortic Atherosclerosis (ICD10-I70.0). Electronically Signed   By: Keith Rake M.D.   On: 02/21/2021 16:54   DG Chest Port 1 View  Result Date: 03/20/2021 CLINICAL DATA:  Possible aspiration EXAM: PORTABLE CHEST 1 VIEW COMPARISON:   03/18/2021 FINDINGS: Cardiac shadow is stable. Postsurgical changes are again seen. Tracheostomy tube and gastric catheter are noted in satisfactory position. Right-sided PICC line is noted at the cavoatrial junction. The lungs are well aerated bilaterally. Mild atelectasis is again seen in the right base. No new focal infiltrate is seen. IMPRESSION: Stable appearance when compare with the prior exam. No findings to suggest aspiration are seen. Electronically Signed   By: Inez Catalina M.D.   On: 03/20/2021 12:38   DG Chest Port 1 View  Result Date: 03/18/2021 CLINICAL DATA:  Sepsis EXAM: PORTABLE CHEST 1 VIEW COMPARISON:  March 12, 2021 FINDINGS: Tracheostomy catheter tip is 5.4 cm above the carina. Central catheter tip in right atrium slightly distal to the cavoatrial junction. Nasogastric tube tip and side port in stomach. No pneumothorax. There is atelectatic change in the right base. Lungs elsewhere are clear. The heart size and pulmonary vascular normal. Patient is status post coronary artery bypass grafting. No bone lesions. IMPRESSION: Tube and catheter positions as described without pneumothorax. Right base atelectasis. Lungs otherwise clear. Stable cardiac silhouette. Electronically Signed   By: Lowella Grip III M.D.   On: 03/18/2021 10:43   DG Chest Port 1 View  Result Date: 03/12/2021 CLINICAL DATA:  Acute respiratory failure EXAM: PORTABLE CHEST 1 VIEW COMPARISON:  Prior chest x-ray 03/06/2021 FINDINGS: A tracheostomy tube is now present. The tip is midline and  at the level of the clavicles. Right upper extremity PICC in good position with the tip at the superior cavoatrial junction. Well-positioned gastric tube with the tip overlying the gastric fundus. Patient is status post median sternotomy with evidence of prior multivessel CABG. The cardiac and mediastinal contours are normal. Markedly decreased pulmonary vascular congestion and interstitial edema compared to prior. Chronic bronchitic  changes and lingular atelectasis versus scarring. No large pleural effusion or pneumothorax. No acute osseous abnormality. IMPRESSION: 1. Interval resolution of pulmonary edema compared to 03/06/2021. 2. Interval placement of a tracheostomy tube which is in good position. 3. Other support apparatus remain in stable and satisfactory position. 4. Chronic bronchitic changes and lingular atelectasis versus scarring. Electronically Signed   By: Jacqulynn Cadet M.D.   On: 03/12/2021 06:51   Portable Chest x-ray  Result Date: 03/06/2021 CLINICAL DATA:  ETT placement EXAM: PORTABLE CHEST 1 VIEW COMPARISON:  03/04/2021 FINDINGS: Endotracheal tube with the tip 3.2 cm above the carina. Nasogastric tube coiled in the stomach. Right subclavian central venous catheter with the tip projecting over the SVC. Bilateral interstitial and alveolar airspace opacities. Small left pleural effusion. No pneumothorax. Stable cardiomediastinal silhouette. No acute osseous abnormality. IMPRESSION: 1. Endotracheal tube with the tip 3.2 cm above the carina. 2. Nasogastric tube coiled in the stomach. 3. Bilateral interstitial and alveolar airspace opacities concerning for pulmonary edema. Electronically Signed   By: Kathreen Devoid   On: 03/06/2021 18:30   DG Chest Port 1 View  Result Date: 03/04/2021 CLINICAL DATA:  Check endotracheal tube placement EXAM: PORTABLE CHEST 1 VIEW COMPARISON:  Film from the previous day. FINDINGS: Cardiac shadow is stable. Postsurgical changes are again seen. Right-sided PICC line is noted in satisfactory position. Endotracheal tube and gastric catheter are seen in satisfactory position. Mild left basilar atelectasis and small effusion is seen. No other focal abnormality is noted. IMPRESSION: Tubes and lines as described in satisfactory position. Mild left basilar atelectasis with associated small effusion. Electronically Signed   By: Inez Catalina M.D.   On: 03/04/2021 03:44   DG Chest Port 1 View  Result  Date: 03/03/2021 CLINICAL DATA:  Hypoxia EXAM: PORTABLE CHEST 1 VIEW COMPARISON:  03/02/2021 FINDINGS: Endotracheal tube terminates 5.3 cm above the carina. Mild patchy bilateral lower lobe opacities, likely atelectasis, with possible trace left pleural effusion. No frank interstitial edema. No pneumothorax. The heart is normal in size. Postsurgical changes related to prior CABG. Right subclavian venous catheter terminates at the cavoatrial junction. Enteric tube terminates in the proximal stomach. Median sternotomy. IMPRESSION: Mild patchy bilateral lower lobe opacities, likely atelectasis, with possible trace left pleural effusion. No frank interstitial edema. Endotracheal tube terminates 5.3 cm above the carina. Additional support apparatus as above. Electronically Signed   By: Julian Hy M.D.   On: 03/03/2021 05:57   Portable Chest xray  Result Date: 03/02/2021 CLINICAL DATA:  Acute respiratory failure with hypoxia EXAM: PORTABLE CHEST 1 VIEW COMPARISON:  Radiograph 03/01/2021 FINDINGS: Endotracheal tube tip terminates 4 cm from the carina. Transesophageal tube tip and side port distal to the GE junction within the gastric body. Postsurgical changes from sternotomy and CABG with stable cardiomediastinal contours including a calcified, tortuous aorta. Telemetry leads and support devices overlie the chest. Central vascular congestion with hazy interstitial opacities which could reflect combination of atelectasis and edema. Layering left effusion. No visible right effusion. No pneumothorax. No other acute osseous or soft tissue abnormality. IMPRESSION: 1. Lines and tubes as above. 2. Prior sternotomy and CABG. 3.  Hazy opacities likely reflecting combination edema and atelectasis with layering left effusion. Electronically Signed   By: Lovena Le M.D.   On: 03/02/2021 04:28   DG Chest Port 1 View  Result Date: 03/01/2021 CLINICAL DATA:  ET tube and OG tube placement EXAM: PORTABLE CHEST 1 VIEW  COMPARISON:  None. FINDINGS: The heart size and mediastinal contours are within normal limits. ETT is 1.1 cm above the carina. Overlying median sternotomy wires and surgical are present. There is prominence of the central pulmonary vasculature. No acute osseous abnormality. IMPRESSION: ETT is 1.1 cm above the carina. Mild pulmonary vascular congestion. Electronically Signed   By: Prudencio Pair M.D.   On: 03/01/2021 19:41   DG Abd Portable 2V  Result Date: 02/23/2021 CLINICAL DATA:  Abdominal pain, diverticulitis EXAM: PORTABLE ABDOMEN - 2 VIEW COMPARISON:  02/21/2021 CT abdomen/pelvis FINDINGS: Intact visualized lower sternotomy wires. No dilated small bowel loops. Large diffuse colonic stool volume. No evidence pneumatosis or pneumoperitoneum. No radiopaque nephrolithiasis. IMPRESSION: Large diffuse colonic stool volume, suggesting constipation. Nonobstructive bowel gas pattern. No evidence of pneumoperitoneum. Electronically Signed   By: Ilona Sorrel M.D.   On: 02/23/2021 10:38   DG BE (COLON)W SINGLE CM (SOL OR THIN BA)  Result Date: 02/26/2021 CLINICAL DATA:  No bowel movements for than 1 week. EXAM: WATER SOLUBLE CONTRAST ENEMA TECHNIQUE: Initial scout AP supine abdominal image obtained to insure adequate colon cleansing. Dilute Gastrografin was introduced into the colon in a retrograde fashion and refluxed from the rectum to the cecum. FLUOROSCOPY TIME:  Fluoroscopy Time:  0.8 minute Radiation Exposure Index (if provided by the fluoroscopic device): 8.5 mGy Number of Acquired Spot Images: 0 COMPARISON:  None. FINDINGS: KUB: Small amount of oral contrast material within the colon. Large amount of stool in the sigmoid colon. No bowel dilatation to suggest obstruction. No evidence of pneumoperitoneum, portal venous gas or pneumatosis. No pathologic calcifications along the expected course of the ureters. No acute osseous abnormality. Single-contrast barium enema: An enema catheter was inserted and  retention balloon distended under fluoroscopy. Dilute Gastrografin was then introduced into the colon. Contrast opacifies the rectosigmoid colon. Contrast could not be refluxed past the sigmoid colon. The patient was unable to retain the contrast after multiple attempts. IMPRESSION: Contrast opacifies the rectosigmoid colon. Contrast could not be refluxed past the sigmoid colon. The patient was unable to retain the contrast after multiple attempts. Electronically Signed   By: Kathreen Devoid   On: 02/26/2021 11:57   ECHOCARDIOGRAM COMPLETE  Result Date: 03/02/2021    ECHOCARDIOGRAM REPORT   Patient Name:   Desiree Madden Date of Exam: 03/02/2021 Medical Rec #:  [id number]      Height:       64.0 in Accession #:    [id number]     Weight:       148.1 lb Date of Birth:  [birth year]     BSA:          1.722 m Patient Age:    27 years       BP:           Not listed in chart/Not listed in                                              chart mmHg Patient Gender: F  HR:           85 bpm. Exam Location:  [facility] Procedure: 2D Echo, Cardiac Doppler, Color Doppler and Strain Analysis Indications:     CHF-acute systolic M19.62  History:         Patient has no prior history of Echocardiogram examinations.                  Risk Factors:Hypertension.  Sonographer:     Sherrie Sport RDCS (AE) Referring Phys:  [id number] Nelle Don Diagnosing Phys: Ida Rogue MD  Sonographer Comments: Global longitudinal strain was attempted. IMPRESSIONS  1. Left ventricular ejection fraction, by estimation, is 50 to 55%. The left ventricle has low normal function. The left ventricle demonstrates regional wall motion abnormalities (Concern for basal to mid inferior wall hypokinesis). Left ventricular diastolic parameters are indeterminate. The average left ventricular global longitudinal strain is -11.3 %. The global longitudinal strain is abnormal.  2. Right ventricular systolic function is normal. The right ventricular size is normal.  3.  Left atrial size was mildly dilated.  4. The mitral valve is normal in structure. Moderate mitral valve regurgitation.  5. Left pleural effusion noted, 7 cm FINDINGS  Left Ventricle: Left ventricular ejection fraction, by estimation, is 50 to 55%. The left ventricle has low normal function. The left ventricle demonstrates regional wall motion abnormalities. The average left ventricular global longitudinal strain is -11.3 %. The global longitudinal strain is abnormal. The left ventricular internal cavity size was normal in size. There is no left ventricular hypertrophy. Left ventricular diastolic parameters are indeterminate. Right Ventricle: The right ventricular size is normal. No increase in right ventricular wall thickness. Right ventricular systolic function is normal. Left Atrium: Left atrial size was mildly dilated. Right Atrium: Right atrial size was normal in size. Pericardium: There is no evidence of pericardial effusion. Mitral Valve: The mitral valve is normal in structure. Moderate mitral valve regurgitation. No evidence of mitral valve stenosis. Tricuspid Valve: The tricuspid valve is normal in structure. Tricuspid valve regurgitation is mild . No evidence of tricuspid stenosis. Aortic Valve: The aortic valve is normal in structure. Aortic valve regurgitation is not visualized. Mild to moderate aortic valve sclerosis/calcification is present, without any evidence of aortic stenosis. Aortic valve mean gradient measures 3.0 mmHg. Aortic valve peak gradient measures 5.6 mmHg. Aortic valve area, by VTI measures 2.02 cm. Pulmonic Valve: The pulmonic valve was normal in structure. Pulmonic valve regurgitation is not visualized. No evidence of pulmonic stenosis. Aorta: The aortic root is normal in size and structure. Venous: The inferior vena cava is normal in size with greater than 50% respiratory variability, suggesting right atrial pressure of 3 mmHg. IAS/Shunts: No atrial level shunt detected by color  flow Doppler.  LEFT VENTRICLE PLAX 2D LVIDd:         4.78 cm  Diastology LVIDs:         3.43 cm  LV e' medial:    7.07 cm/s LV PW:         0.93 cm  LV E/e' medial:  11.8 LV IVS:        0.77 cm  LV e' lateral:   9.36 cm/s LVOT diam:     2.00 cm  LV E/e' lateral: 8.9 LV SV:         39 LV SV Index:   23       2D Longitudinal Strain LVOT Area:     3.14 cm 2D Strain GLS Avg:     -11.3 %  3D Volume EF:                         3D EF:        61 %                         LV EDV:       175 ml                         LV ESV:       68 ml                         LV SV:        107 ml RIGHT VENTRICLE RV Basal diam:  2.92 cm LEFT ATRIUM           Index       RIGHT ATRIUM           Index LA diam:      4.30 cm 2.50 cm/m  RA Area:     12.70 cm LA Vol (A4C): 58.8 ml 34.14 ml/m RA Volume:   23.50 ml  13.65 ml/m  AORTIC VALVE                   PULMONIC VALVE AV Area (Vmax):    1.77 cm    PV Vmax:        0.64 m/s AV Area (Vmean):   1.68 cm    PV Peak grad:   1.6 mmHg AV Area (VTI):     2.02 cm    RVOT Peak grad: 1 mmHg AV Vmax:           118.00 cm/s AV Vmean:          77.250 cm/s AV VTI:            0.192 m AV Peak Grad:      5.6 mmHg AV Mean Grad:      3.0 mmHg LVOT Vmax:         66.60 cm/s LVOT Vmean:        41.300 cm/s LVOT VTI:          0.124 m LVOT/AV VTI ratio: 0.64  AORTA Ao Root diam: 3.10 cm MITRAL VALVE               TRICUSPID VALVE MV Area (PHT): 4.12 cm    TR Peak grad:   24.4 mmHg MV Decel Time: 184 msec    TR Vmax:        247.00 cm/s MV E velocity: 83.60 cm/s MV A velocity: 74.60 cm/s  SHUNTS MV E/A ratio:  1.12        Systemic VTI:  0.12 m                            Systemic Diam: 2.00 cm Ida Rogue MD Electronically signed by Ida Rogue MD Signature Date/Time: 03/02/2021/12:35:38 PM    Final    CT IMAGE GUIDED DRAINAGE BY PERCUTANEOUS CATHETER  Result Date: 02/28/2021 INDICATION: 70 year old female with a history of diverticular abscess. Known colovesical fistula. She presents for  drainage of the abscess EXAM: CT GUIDED DRAINAGE OF  ABSCESS MEDICATIONS: The patient is currently admitted to the hospital and receiving intravenous antibiotics. The antibiotics were administered within an appropriate time frame prior to the  initiation of the procedure. ANESTHESIA/SEDATION: 1.0 mg IV Versed 50 mcg IV Fentanyl Moderate Sedation Time:  11 minutes The patient was continuously monitored during the procedure by the interventional radiology nurse under my direct supervision. COMPLICATIONS: None TECHNIQUE: Informed written consent was obtained from the patient after a thorough discussion of the procedural risks, benefits and alternatives. All questions were addressed. Maximal Sterile Barrier Technique was utilized including caps, mask, sterile gowns, sterile gloves, sterile drape, hand hygiene and skin antiseptic. A timeout was performed prior to the initiation of the procedure. PROCEDURE: The left lower quadrant was prepped with chlorhexidine in a sterile fashion, and a sterile drape was applied covering the operative field. A sterile gown and sterile gloves were used for the procedure. Local anesthesia was provided with 1% Lidocaine. Once the patient was prepped and draped and anesthetized with 1% lidocaine, CT guidance was used to place a 18 gauge trocar needle into the abscess of the left lower quadrant. Modified Seldinger technique was then used to place a 12 [place] drain. Approximately 100 cc of thin amber fluid aspirated for a culture. Drain was attached to bulb suction and sutured in position. Patient tolerated the procedure well and remained hemodynamically stable throughout. No complications were encountered and no significant blood loss. FINDINGS: Final image demonstrates pigtail drainage catheter in place within the abscess of the left lower quadrant. The only aspirate achieved was thin amber fluid. There is persisting flocculent material around the drain at the completion. This may represent  frank contamination of the peritoneum with fecal material. Approximately 100 cc of fluid aspirated with a sample sent for culture. IMPRESSION: Status post CT-guided drainage of left lower quadrant abscess Signed, Dulcy Fanny. Dellia Nims, RPVI [facility] Electronically Signed   By: Corrie Mckusick D.O.   On: 02/28/2021 15:13   ECHOCARDIOGRAM LIMITED  Result Date: 03/18/2021    ECHOCARDIOGRAM LIMITED REPORT   Patient Name:   Desiree Madden Date of Exam: 03/16/2021 Medical Rec #:  [id number]      Height:       64.0 in Accession #:    [id number]     Weight:       148.1 lb Date of Birth:  Sep 02, 1951     BSA:          1.722 m Patient Age:    12 years       BP: Patient Gender: F              HR: Exam Location:  [facility] Procedure: 2D Echo Indications:     R06.02 SOB  History:         CHF and Cardiomyopathy.  Sonographer:     JERRY Referring Phys:  [id number] DWAYNE D CALLWOOD Diagnosing Phys: Bartholome Bill MD IMPRESSIONS  1. Left ventricular ejection fraction, by estimation, is 20 to 25%. The left ventricle has severely decreased function. The left ventricular internal cavity size was mildly dilated. There is moderate left ventricular hypertrophy. Left ventricular diastolic parameters are consistent with Grade I diastolic dysfunction (impaired relaxation).  2. Left atrial size was mildly dilated.  3. Right atrial size was mildly dilated.  4. The mitral valve is grossly normal. Mild mitral valve regurgitation.  5. The aortic valve was not well visualized. Aortic valve regurgitation is trivial. FINDINGS  Left Ventricle: Left ventricular ejection fraction, by estimation, is 20 to 25%. The left ventricle has severely decreased function. The left ventricular internal cavity size was mildly dilated. There is moderate left  ventricular hypertrophy. Left ventricular diastolic parameters are consistent with Grade I diastolic dysfunction (impaired relaxation). Left Atrium: Left atrial  size was mildly dilated. Right Atrium: Right atrial size was mildly dilated. Pericardium: There is no evidence of pericardial effusion. Mitral Valve: The mitral valve is grossly normal. Mild mitral valve regurgitation. Tricuspid Valve: The tricuspid valve is not well visualized. Tricuspid valve regurgitation is trivial. Aortic Valve: The aortic valve was not well visualized. Aortic valve regurgitation is trivial. Pulmonic Valve: The pulmonic valve was not assessed. Aorta: The aortic root is normal in size and structure. IAS/Shunts: The interatrial septum was not assessed. LEFT VENTRICLE PLAX 2D LVIDd:         4.50 cm LVIDs:         4.27 cm LV PW:         1.28 cm LV IVS:        1.23 cm  LEFT ATRIUM         Index LA diam:    4.10 cm 2.38 cm/m   AORTA Ao Root diam: 3.10 cm Bartholome Bill MD Electronically signed by Bartholome Bill MD Signature Date/Time: 03/18/2021/11:29:01 AM    Final    [place] EKG SITE RITE  Result Date: 03/02/2021 If Site Rite image not attached, placement could not be confirmed due to current cardiac rhythm.   Assessment & Plan     1. SVT-has a history of atrial fibrillation. Currently appears to be in sinus tachycardia. Would continue with amiodarone 200 mg per tube. Echo revealed EF 45 to 50% globally. WIll follow  2. Cardiomyopathy-appears stable. No obvious heart failure at present. Failure to wean appears to be from a respiratory standpoint.  3. Diverticulitis-being followed on broad-spectrum antibiotics. Status post colectomy.  Signed, Javier Docker Fath MD 03/20/2021, 1:54 PM  Pager: [phone]

## 2021-03-20 NOTE — Plan of Care (Signed)
Pt is AAOx2. Pt on her trach collar sats well. Ostomy is clean and dry no leaking noted. Wound vac ongoing and dressing was changed yesterday per wound care. Foley care was done. Pt reposition during the shift. Pt denies any pain. VSS. All needs met. Will continue to monitor. Pt TF ongoing and tolerating well.      Problem: Education: Goal: Knowledge of General Education information will improve Description: Including pain rating scale, medication(s)/side effects and non-pharmacologic comfort measures Outcome: Progressing   Problem: Health Behavior/Discharge Planning: Goal: Ability to manage health-related needs will improve Outcome: Progressing   Problem: Clinical Measurements: Goal: Ability to maintain clinical measurements within normal limits will improve Outcome: Progressing Goal: Will remain free from infection Outcome: Progressing Goal: Diagnostic test results will improve Outcome: Progressing Goal: Respiratory complications will improve Outcome: Progressing Goal: Cardiovascular complication will be avoided Outcome: Progressing   Problem: Activity: Goal: Risk for activity intolerance will decrease Outcome: Progressing   Problem: Nutrition: Goal: Adequate nutrition will be maintained Outcome: Progressing   Problem: Coping: Goal: Level of anxiety will decrease Outcome: Progressing   Problem: Elimination: Goal: Will not experience complications related to bowel motility Outcome: Progressing Goal: Will not experience complications related to urinary retention Outcome: Progressing   Problem: Pain Managment: Goal: General experience of comfort will improve Outcome: Progressing   Problem: Safety: Goal: Ability to remain free from injury will improve Outcome: Progressing   Problem: Skin Integrity: Goal: Risk for impaired skin integrity will decrease Outcome: Progressing

## 2021-03-21 DIAGNOSIS — E43 Unspecified severe protein-calorie malnutrition: Secondary | ICD-10-CM | POA: Diagnosis not present

## 2021-03-21 DIAGNOSIS — K5792 Diverticulitis of intestine, part unspecified, without perforation or abscess without bleeding: Secondary | ICD-10-CM | POA: Diagnosis not present

## 2021-03-21 LAB — BASIC METABOLIC PANEL
Anion gap: 7 (ref 5–15)
BUN: 60 mg/dL — ABNORMAL HIGH (ref 8–23)
CO2: 22 mmol/L (ref 22–32)
Calcium: 8.6 mg/dL — ABNORMAL LOW (ref 8.9–10.3)
Chloride: 110 mmol/L (ref 98–111)
Creatinine, Ser: 0.88 mg/dL (ref 0.44–1.00)
GFR, Estimated: >60 mL/min (ref >60)
Glucose, Bld: 131 mg/dL — ABNORMAL HIGH (ref 70–99)
Potassium: 3.7 mmol/L (ref 3.5–5.1)
Sodium: 139 mmol/L (ref 135–145)

## 2021-03-21 LAB — GLUCOSE, CAPILLARY
Glucose-Capillary: 121 mg/dL — ABNORMAL HIGH (ref 70–99)
Glucose-Capillary: 176 mg/dL — ABNORMAL HIGH (ref 70–99)
Glucose-Capillary: 181 mg/dL — ABNORMAL HIGH (ref 70–99)
Glucose-Capillary: 151 mg/dL — ABNORMAL HIGH (ref 70–99)
Glucose-Capillary: 141 mg/dL — ABNORMAL HIGH (ref 70–99)
Glucose-Capillary: 141 mg/dL — ABNORMAL HIGH (ref 70–99)

## 2021-03-21 LAB — CBC
HCT: 27.3 % — ABNORMAL LOW (ref 36.0–46.0)
Hemoglobin: 8.8 g/dL — ABNORMAL LOW (ref 12.0–15.0)
MCH: 30.6 pg (ref 26.0–34.0)
MCHC: 32.2 g/dL (ref 30.0–36.0)
MCV: 94.8 fL (ref 80.0–100.0)
Platelets: 177 10*3/uL (ref 150–400)
RBC: 2.88 MIL/uL — ABNORMAL LOW (ref 3.87–5.11)
RDW: 17.1 % — ABNORMAL HIGH (ref 11.5–15.5)
WBC: 8.3 10*3/uL (ref 4.0–10.5)
nRBC: 0 % (ref 0.0–0.2)

## 2021-03-21 LAB — PHOSPHORUS: Phosphorus: 3.7 mg/dL (ref 2.5–4.6)

## 2021-03-21 LAB — MAGNESIUM: Magnesium: 1.7 mg/dL (ref 1.7–2.4)

## 2021-03-21 MED ORDER — POTASSIUM CHLORIDE 20 MEQ PO PACK
40.0000 meq | PACK | Freq: Once | ORAL | Status: AC
  Administered 2021-03-21: 40 meq
  Filled 2021-03-21: qty 2

## 2021-03-21 MED ORDER — MAGNESIUM SULFATE 2 GM/50ML IV SOLN
2.0000 g | Freq: Once | INTRAVENOUS | Status: AC
  Administered 2021-03-21: 2 g via INTRAVENOUS
  Filled 2021-03-21: qty 50

## 2021-03-21 NOTE — Consult Note (Addendum)
[place] Nurse wound follow up Vac dressing change performed.  Wound type:Abd with full thickness post-op wound to midline; is evolving to 2 separate wounds to upper and lower abd with narrow bridge of skin in between. Beefyred, small amt pink drainage, no odor Pt was medicated for pain prior to the procedure and tolerated with minimal amt discomfort.Applied barrier ring to creases surrounding wound at 3:00 o'clock and 9:00 o'clock andalso5:00 o'clock to 7:00 o'clock, then one piece black foam to 164mm cont suction. Wound is located in close proximity to the ostomy and may be difficult to maintain a seal. WOC team will change dressing Q M/W/F.  WOC Nurse ostomy follow up Stoma type/location:LMQ colostomy Stomal assessment/size:1 and 1/2 inches round, red, edematous, raised and slightly long (2cm). There is a crease at 9:00 o'clock to the stoma and another deep crease below stoma; applied barrier ring to attempt to maintain a seal. Output:mod amt liquid brown stool Ostomy pouching: 2pc.2 and 3/4 inch pouching system with skin barrier ring Education provided:No family present; progress notes indicate pt will discharge to [facility] and have total assistance with pouching activities.  Enrolled patient in [facility] Discharge program: Not yet Julien Girt MSN, RN, CWOCN, CWCN-AP, CNS      Revision History

## 2021-03-21 NOTE — Evaluation (Signed)
Occupational Therapy Evaluation Patient Details Name: Desiree Madden MRN: [id number] DOB: [birth year] Today's Date: 03/21/2021    History of Present Illness Desiree Madden is a 70 y.o. female with medical history significant for colonic bladder fistula, diverticulitis, hypertension, hypothyroidism, hyperlipidemia, ulcerative colitis, who presented to the hospital on 02/21/2021 because of abdominal pain.  She was admitted to the hospital for acute diverticulitis.  Hospital course was complicated by perforated diverticulitis and she underwent emergent sigmoid colectomy with end colostomy and Hartman's procedure on 03/01/2021.  She was transferred to the ICU postoperatively.  She developed septic shock requiring multiple vasopressors.  She remained intubated on mechanical ventilation postoperatively.  She could not be liberated from the ventilator.  She failed extubation on 03/06/2021 and was reintubated.  ENT was consulted and tracheostomy was performed on 03/08/2021.   Ultimately extubated 4/8.   Clinical Impression   Pt seen for OT evaluation this date in setting of prolonged acute hospitalization d/t diverticulitis, complicated by perforation. Pt presents this date with decreased fxl activity tolerance, strength, and cognition impacting her ability to safely and efficiently complete her ADLs/ADL mobility. Pt unable to provide PLOF at this time, but data collected by PT at start of pt's hospital stay indicates that she is INDEP at baseline. This date, Pt requires MIN/MOD A for seated UB ADLs, MAX A for seated LB ADLs including attempts to don socks in EOB sitting. Requires MIN A/CGA to sustain static sitting balance. MOD/MAX A with bed mobility as well as cues for sequencing tasks throughout. Will continue to follow acutely. Anticipate that pt will require f/u OT services at [facility] in SNF setting upon d/c from acute setting to improve strength and fxl activity tolerance to reasonable status to safely return home.      Follow Up Recommendations  SNF    Equipment Recommendations   (defer to next level of care.)    Recommendations for Other Services       Precautions / Restrictions Precautions Precautions: Fall Restrictions Weight Bearing Restrictions: No      Mobility Bed Mobility Overal bed mobility: Needs Assistance Bed Mobility: Sidelying to Sit;Sit to Sidelying   Sidelying to sit: Mod assist;HOB elevated     Sit to sidelying: Mod assist;Max assist General bed mobility comments: increased assist to manage LEs for back to bed, increased time, cues for use of railing.    Transfers                      Balance Overall balance assessment: Needs assistance Sitting-balance support: Bilateral upper extremity supported;Feet supported Sitting balance-Leahy Scale: Poor Sitting balance - Comments: requires CGA/MIN A to sustain static sitting balance.                                   ADL either performed or assessed with clinical judgement   ADL Overall ADL's : Needs assistance/impaired                                       General ADL Comments: MIN/MOD A for seated UB ADLs, MAX A for seated LB ADLs including attempts to don socks in EOB sitting. Requires MIN A/CGA to sustain static sitting balance. MOD/MAX A with bed mobiltiy     Vision Patient Visual Report: No change from baseline Additional Comments: difficult to formally assess d/t  cognition, but pt appears to mostly track appropriately when she is attending.     Perception     Praxis      Pertinent Vitals/Pain Pain Assessment: Faces Faces Pain Scale: Hurts little more Pain Location: abd with repositioning Pain Descriptors / Indicators: Grimacing Pain Intervention(s): Limited activity within patient's tolerance;Monitored during session     Hand Dominance     Extremity/Trunk Assessment Upper Extremity Assessment Upper Extremity Assessment: Generalized weakness   Lower  Extremity Assessment Lower Extremity Assessment: Generalized weakness;LLE deficits/detail LLE Deficits / Details: noted limited dorsiflexion, CNA notified and encouraged to stretch and pass on in report.       Communication Communication Communication: No difficulties   Cognition Arousal/Alertness: Awake/alert Behavior During Therapy: WFL for tasks assessed/performed Overall Cognitive Status: Difficult to assess Area of Impairment: Orientation;Following commands;Problem solving                 Orientation Level: Disoriented to;Time     Following Commands: Follows one step commands with increased time     Problem Solving: Difficulty sequencing;Requires verbal cues;Requires tactile cues General Comments: attends throughout, but often only gives a "thumbs up" to respond despite the question. Pt does demo some lethargy that coudl be impacting cognititon this session, but she is not oriented to time. In addition, she required increased processing time to follow simple one step commands. She follows ~90%.   General Comments       Exercises Other Exercises Other Exercises: OT engages pt in straight arm raises x10 each side (alternating as she has difficulty sustaining static sitting w/o B UE support) with MAX verbal/tactile/visual cues for form/pace and technique.   Shoulder Instructions      Home Living Family/patient expects to be discharged to:: Private residence Living Arrangements: Spouse/significant other Available Help at Discharge: Available PRN/intermittently   Home Access: Stairs to enter Entrance Stairs-Number of Steps: 3                   Home Equipment: None          Prior Functioning/Environment Level of Independence: Independent        Comments: Pt reports that she is active in the community and independent w/o AD.        OT Problem List: Decreased strength;Decreased range of motion;Decreased activity tolerance;Impaired balance (sitting  and/or standing);Decreased coordination;Decreased cognition;Decreased knowledge of use of DME or AE;Cardiopulmonary status limiting activity;Impaired tone      OT Treatment/Interventions: Self-care/ADL training;DME and/or AE instruction;Therapeutic activities;Balance training;Therapeutic exercise;Energy conservation;Patient/family education    OT Goals(Current goals can be found in the care plan section) Acute Rehab OT Goals Patient Stated Goal: none stated OT Goal Formulation: Patient unable to participate in goal setting Time For Goal Achievement: 04/04/21 Potential to Achieve Goals: Fair ADL Goals Pt Will Perform Upper Body Bathing: with min guard assist;sitting;with set-up Pt Will Perform Upper Body Dressing: with min guard assist;sitting;with set-up Pt Will Perform Lower Body Dressing: with min assist;with mod assist;sitting/lateral leans (with AE PRN/as appropriate) Pt Will Transfer to Toilet: with min assist;with mod assist;stand pivot transfer;bedside commode Pt/caregiver will Perform Home Exercise Program: Increased strength;Both right and left upper extremity;With minimal assist  OT Frequency: Min 1X/week   Barriers to D/C:            Co-evaluation              AM-PAC OT "6 Clicks" Daily Activity     Outcome Measure Help from another person eating meals?: A Little (NPO,  MIN A to simulate) Help from another person taking care of personal grooming?: A Lot Help from another person toileting, which includes using toliet, bedpan, or urinal?: A Lot Help from another person bathing (including washing, rinsing, drying)?: A Lot Help from another person to put on and taking off regular upper body clothing?: A Lot Help from another person to put on and taking off regular lower body clothing?: A Lot 6 Click Score: 13   End of Session Equipment Utilized During Treatment: Oxygen (trach collar) Nurse Communication: Other (comment) (notified CNA about positioning recommendations  for L foot)  Activity Tolerance: Patient tolerated treatment well;Patient limited by fatigue Patient left: in bed;with call bell/phone within reach;with bed alarm set  OT Visit Diagnosis: Unsteadiness on feet (R26.81);Muscle weakness (generalized) (M62.81);Other symptoms and signs involving cognitive function                Time: 3567-0141 OT Time Calculation (min): 24 min Charges:  OT General Charges $OT Visit: 1 Visit OT Evaluation $OT Eval Moderate Complexity: 1 Mod OT Treatments $Self Care/Home Management : 8-22 mins  Gerrianne Scale, MS, OTR/L ascom [phone] 03/21/21, 6:06 PM

## 2021-03-21 NOTE — Evaluation (Signed)
Physical Therapy ReEvaluation Patient Details Name: Desiree Madden MRN: [id number] DOB: 12-Oct-1951 Today's Date: 03/21/2021   History of Present Illness  Desiree Madden is a 70 y.o. female with medical history significant for colonic bladder fistula, diverticulitis, hypertension, hypothyroidism, hyperlipidemia, ulcerative colitis, who presented to the hospital on 02/21/2021 because of abdominal pain.  She was admitted to the hospital for acute diverticulitis.  Hospital course was complicated by perforated diverticulitis and she underwent emergent sigmoid colectomy with end colostomy and Hartman's procedure on 03/01/2021.  She was transferred to the ICU postoperatively.  She developed septic shock requiring multiple vasopressors.  She remained intubated on mechanical ventilation postoperatively.  She could not be liberated from the ventilator.  She failed extubation on 03/06/2021 and was reintubated.  ENT was consulted and tracheostomy was performed on 03/08/2021.   Ultimately extubated 4/8.  Clinical Impression  Pt was seen by this PT ~1 month ago prior to surgery and intubation, she was at/near her baseline at that time.  She is now profoundly weak (most notedly with nearly flaccid L ankle DFs) and functionally very limited.  She was able to participate with some limited LE exercises and did relative well with sitting balance at EOB, but she could not even get close to attempting to stand even with max assist.  Pt will certainly require extensive rehab, and PT recommending STR.    Follow Up Recommendations SNF (per progress could be CIR appropriate)    Equipment Recommendations   (TBD at next venue of care)    Recommendations for Other Services       Precautions / Restrictions Precautions Precautions: Fall Restrictions Weight Bearing Restrictions: No      Mobility  Bed Mobility Overal bed mobility: Needs Assistance Bed Mobility: Sidelying to Sit;Sit to Sidelying   Sidelying to sit: Mod  assist;HOB elevated Supine to sit: Max assist   Sit to sidelying: Mod assist;Max assist General bed mobility comments: increased assist to manage LEs for back to bed, increased time, cues for use of railing.    Transfers Overall transfer level: Needs assistance Equipment used: Rolling walker (2 wheeled) Transfers: Sit to/from Stand Sit to Stand: Total assist         General transfer comment: multiple attempts at sit to stand with limited success.  PT assisted with keeping L foot flat and under her, heavy assist and cuing but even with excessive assist we were unable to maintain hips off bed as knees buckled and UEs were unable to effectively assist  Ambulation/Gait             General Gait Details: unable/unsafe  Stairs            Wheelchair Mobility    Modified Rankin (Stroke Patients Only)       Balance Overall balance assessment: Needs assistance Sitting-balance support: Bilateral upper extremity supported;Feet supported Sitting balance-Leahy Scale: Poor Sitting balance - Comments: requires CGA/MIN A to sustain static sitting balance.   Standing balance support: Bilateral upper extremity supported Standing balance-Leahy Scale: Zero Standing balance comment: pt was unable to attain standing even with max assist                             Pertinent Vitals/Pain Pain Assessment: Faces Faces Pain Scale: Hurts little more Pain Location: abd with repositioning Pain Descriptors / Indicators: Grimacing Pain Intervention(s): Limited activity within patient's tolerance;Monitored during session    Home Living Family/patient expects to be discharged to:: Private  residence Living Arrangements: Spouse/significant other Available Help at Discharge: Available PRN/intermittently   Home Access: Stairs to enter   Entrance Stairs-Number of Steps: 3   Home Equipment: None      Prior Function Level of Independence: Independent         Comments: Pt  reports that she is active in the community and independent w/o AD.     Hand Dominance        Extremity/Trunk Assessment   Upper Extremity Assessment Upper Extremity Assessment: Generalized weakness    Lower Extremity Assessment Lower Extremity Assessment: Generalized weakness;LLE deficits/detail LLE Deficits / Details: noted limited dorsiflexion, CNA notified and encouraged to stretch and pass on in report.       Communication   Communication: No difficulties  Cognition Arousal/Alertness: Awake/alert Behavior During Therapy: WFL for tasks assessed/performed Overall Cognitive Status: Difficult to assess Area of Impairment: Orientation;Following commands;Problem solving                 Orientation Level: Disoriented to;Time     Following Commands: Follows one step commands with increased time     Problem Solving: Difficulty sequencing;Requires verbal cues;Requires tactile cues General Comments: attends throughout, but often only gives a "thumbs up" to respond despite the question. Pt does demo some lethargy that coudl be impacting cognititon this session, but she is not oriented to time. In addition, she required increased processing time to follow simple one step commands. She follows ~90%.      General Comments      Exercises General Exercises - Lower Extremity Ankle Circles/Pumps: PROM;AROM;10 reps (AROM t/o except L DF, AA/PROM) Heel Slides: AAROM;AROM;10 reps Hip ABduction/ADduction: AAROM;10 reps Other Exercises Other Exercises: OT engages pt in straight arm raises x10 each side (alternating as she has difficulty sustaining static sitting w/o B UE support) with MAX verbal/tactile/visual cues for form/pace and technique.   Assessment/Plan    PT Assessment Patient needs continued PT services  PT Problem List         PT Treatment Interventions DME instruction;Gait training;Functional mobility training;Stair training;Therapeutic activities;Therapeutic  exercise;Balance training;Neuromuscular re-education;Cognitive remediation;Patient/family education    PT Goals (Current goals can be found in the Care Plan section)  Acute Rehab PT Goals Patient Stated Goal: none stated PT Goal Formulation: Patient unable to participate in goal setting Time For Goal Achievement: 04/11/21 Potential to Achieve Goals: Fair    Frequency Min 2X/week   Barriers to discharge        Co-evaluation               AM-PAC PT "6 Clicks" Mobility  Outcome Measure Help needed turning from your back to your side while in a flat bed without using bedrails?: A Lot Help needed moving from lying on your back to sitting on the side of a flat bed without using bedrails?: Total Help needed moving to and from a bed to a chair (including a wheelchair)?: Total Help needed standing up from a chair using your arms (e.g., wheelchair or bedside chair)?: Total Help needed to walk in hospital room?: Total Help needed climbing 3-5 steps with a railing? : Total 6 Click Score: 7    End of Session Equipment Utilized During Treatment: Gait belt Activity Tolerance: Patient tolerated treatment well Patient left: with bed alarm set;with call bell/phone within reach Nurse Communication: Mobility status PT Visit Diagnosis: Muscle weakness (generalized) (M62.81);Difficulty in walking, not elsewhere classified (R26.2);Apraxia (R48.2);Unsteadiness on feet (R26.81)    Time: 4332-9518 PT Time Calculation (min) (ACUTE ONLY):  34 min   Charges:   PT Evaluation $PT Re-evaluation: 1 Re-eval PT Treatments $Therapeutic Exercise: 8-22 mins $Therapeutic Activity: 8-22 mins        Kreg Shropshire, DPT 03/21/2021, 6:03 PM

## 2021-03-21 NOTE — Evaluation (Signed)
Clinical/Bedside Swallow Evaluation Patient Details  Name: Desiree Madden MRN: [id number] Date of Birth: 06-23-1951  Today's Date: 03/21/2021 Time: SLP Start Time (ACUTE ONLY): 1440 SLP Stop Time (ACUTE ONLY): 1540 SLP Time Calculation (min) (ACUTE ONLY): 60 min  Past Medical History:  Past Medical History:  Diagnosis Date  . Hypertension   . Thyroid disease    Past Surgical History:  Past Surgical History:  Procedure Laterality Date  . CARDIAC SURGERY    . COLECTOMY WITH COLOSTOMY CREATION/HARTMANN PROCEDURE Left 03/01/2021   Procedure: COLECTOMY WITH COLOSTOMY CREATION/HARTMANN PROCEDURE;  Surgeon: Ronny Bacon, MD;  Location: [facility] OR[facility];  Service: General;  Laterality: Left;  . COLOSTOMY REVISION N/A 03/09/2021   Procedure: COLOSTOMY REVISION;  Surgeon: Ronny Bacon, MD;  Location: [facility] OR[facility];  Service: General;  Laterality: N/A;  . TRACHEOSTOMY TUBE PLACEMENT N/A 03/08/2021   Procedure: TRACHEOSTOMY;  Surgeon: Clyde Canterbury, MD;  Location: [facility] ORS;  Service: ENT;  Laterality: N/A;   HPI:  Pt is a 70 y.o. female who admitted on 02/21/2021 w/ Diverticulitis with Colovesical fistula and UTI, now 64 Days Post-Op s/p Hartman's Procedure for complicated diverticulitis with gangrenous changes and perforation with feculent peritonitis and 4 days s/p colostomy revision.  Pt remained on vent failing wean; Tracheostomy placed on 03/08/2021.  Pt is now on Trach Collar O2 support weaning from full vent support on 03/15/2021.  Ongoing Wound Care and NG tube.   Assessment / Plan / Recommendation Clinical Impression  Pt seen for ongoing assessment to determine readiness for use of PMV for verbal communication. Pt has been demonstrating improvement w/ tolerance of wear, and appropriate vocal quality for verbal communication. Pt's Alertness is also much improved today. Tracheostomy was placed on 03/08/2021 after ~2 weeks of oral intubation and failing vent wean trials. Pt is now on Trach Collar O2  support, FiO2 28% 5L, weaned from full vent support on 03/15/2021. Pt has NG tube for nutritional needs currently. Pt has a Shiley size 6 tube Cuffed, but Cuff is fully deflated at Baseline.  Pt presented w/ Full Alertness and engaged w/ this SLP given only min verbal/tactile cues for follow through -- pt appears to have min Confusion suspect related to lengthy illness. Oral care given w/ appropriate lingual/labial responses. Post determining full Cuff deflation, PMV was placed. O2 sats remained 100%, RR 17-19, HR ~105 as at her Baseline. Pt practiced "loud" phonations of "ahh" and counting 1-3 w/ approprirate vocal cord contact and vocal quality. Pt then spoke w/ SLP in basic conversation and then on phone w/ Daughter w/ several verbal responses. No ANS changes: HR low 100s, RR 18, O2 sats 99-100%. BSE then followed w/ trials of ice chips post oral care. No overt, clinical s/s of aspiration noted w/ 20+ trials of Single ice chips. Vocal quality remained clear; no immediate coughing; no changes in ANS. Oral phase appeared [facility] for bolus management then transfer for prompt swallows. OM exam appeared grossly [facility] for lingual/labial movements; speech clear. Pt was assisted w/ feeding.  Recommend frequent oral care for hygiene and stimulation of swallowing. Recommend PRN wear/use of PMV for verbal communication w/ others w/ 100% Supervision and following precautions as posted above bed in room. PMV wear will aid in Pulmonary status improvement and in engagement and communication w/ other to support Cognitive/mental status improvement and awareness. Encouraged Dtr to call and talk to pt via phone having [place] place and monitor the PMV. Recommend Pleasure PRN Single ice chips while wearing the PMV(MUST WEAR PMV FOR  ORAL INTAKE) following aspiration precautions. Recommend f/u w/ MBSS tomorrow post removal of NG for assessment. Daughter agreed w/ POC. Pt updated. NSG/MD updated. SLP Visit Diagnosis: Dysphagia, oropharyngeal  phase (R13.12);Aphonia (R49.1) (tracheostomy)    Aspiration Risk  Mild aspiration risk;Risk for inadequate nutrition/hydration    Diet Recommendation  NPO w/ NG TFs currently; Pleasure PRN Single ice chips wearing PMV; aspiration precautions and oral care(prior to ice chips)  Medication Administration: Via alternative means (NG)    Other  Recommendations Recommended Consults:  (Dietician f/u) Oral Care Recommendations: Oral care QID;Oral care prior to ice chip/H20;Staff/trained caregiver to provide oral care Other Recommendations:  (TBD)   Follow up Recommendations Skilled Nursing facility (TBD)      Frequency and Duration min 3x week  2 weeks       Prognosis Prognosis for Safe Diet Advancement: Fair (-Good) Barriers to Reach Goals: Cognitive deficits;Severity of deficits;Time post onset Barriers/Prognosis Comment: Tracheostomy; PMV wear      Swallow Study   General Date of Onset: 02/21/21 HPI: Pt is a 70 y.o. female who admitted on 02/21/2021 w/ Diverticulitis with Colovesical fistula and UTI, now 12 Days Post-Op s/p Hartman's Procedure for complicated diverticulitis with gangrenous changes and perforation with feculent peritonitis and 4 days s/p colostomy revision.  Pt remained on vent failing wean; Tracheostomy placed on 03/08/2021.  Pt is now on Trach Collar O2 support weaning from full vent support on 03/15/2021.  Ongoing Wound Care and NG tube. Type of Study: Bedside Swallow Evaluation Previous Swallow Assessment: none noted Diet Prior to this Study: NPO;NG Tube Temperature Spikes Noted: No (wbc 8.3) Respiratory Status: Trach Collar (5L) History of Recent Intubation: Yes Length of Intubations (days): 14 days Date extubated: 03/08/21 (trach placed) Behavior/Cognition: Alert;Cooperative;Pleasant mood;Confused;Distractible;Requires cueing Oral Cavity Assessment: Dry;Dried secretions (sticky) Oral Care Completed by SLP: Yes Oral Cavity - Dentition: Poor condition;Missing  dentition Vision:  (n/a) Self-Feeding Abilities: Needs assist;Needs set up;Total assist Patient Positioning: Upright in bed (needed full positioning) Baseline Vocal Quality: Normal Volitional Cough:  (Fair - nonvolitional) Volitional Swallow: Unable to elicit    Oral/Motor/Sensory Function Overall Oral Motor/Sensory Function: Within functional limits (grossly)   Ice Chips Ice chips: Within functional limits Presentation: Spoon (fed; 20+) Other Comments: good bolus control and prompt swallows   Thin Liquid Thin Liquid: Not tested    Nectar Thick Nectar Thick Liquid: Not tested   Honey Thick Honey Thick Liquid: Not tested   Puree Puree: Not tested   Solid     Solid: Not tested       [name], MS, CCC-SLP Speech Language Pathologist Rehab Services [phone] [facility] 03/21/2021,6:49 PM

## 2021-03-21 NOTE — Progress Notes (Addendum)
Progress Note    Priscille Shadduck  JYN:[id number] DOB: [birth year]  DOA: 02/21/2021 PCP: [facility]      Brief Narrative:    Medical records reviewed and are as summarized below:  Desiree Madden is a 70 y.o. female with medical history significant for colonic bladder fistula, diverticulitis, hypertension, hypothyroidism, hyperlipidemia, ulcerative colitis, who presented to the hospital on 02/21/2021 because of abdominal pain.  She was admitted to the hospital for acute diverticulitis.  Hospital course was complicated by perforated diverticulitis and she underwent emergent sigmoid colectomy with end colostomy and Hartman's procedure on 03/01/2021.  She was transferred to the ICU postoperatively.  She developed septic shock requiring multiple vasopressors.  She remained intubated on mechanical ventilation postoperatively.  She could not be liberated from the ventilator.  She failed extubation on 03/06/2021 and was reintubated.  ENT was consulted and tracheostomy was performed on 03/08/2021.      Assessment/Plan:   Principal Problem:   Diverticulitis Active Problems:   Fistula, bladder   Ulcerative colitis ([place])   Hyperlipidemia   Essential hypertension   Hypothyroidism   Abnormal ECG   CKD (chronic kidney disease), stage III (HCC)   Elevated lactic acid level   Protein-calorie malnutrition, severe   Nutrition Problem: Severe Malnutrition Etiology: acute illness  Signs/Symptoms: moderate fat depletion,moderate muscle depletion,severe muscle depletion   Body mass index is 25.42 kg/m.     Septic shock, hemorrhagic shock secondary to complicated diverticulitis with perforation, pelvic abscess, gangrene of the sigmoid colon: Shock physiology resolved.  S/p sigmoid colectomy with end colostomy, Hartman's procedure on 03/01/2021.  Completed antibiotics on 03/15/2021.  She has a wound VAC in place.  Continue local wound care.  Follow-up with general surgeon.  Dysphagia,  Severe malnutrition: Continue enteral nutrition.  Speech therapist will continue to assess her swallowing.  May need PEG tube if swallowing does not improve.  Acute on chronic hypoxic respiratory failure: Failure to wean from ventilator requiring tracheostomy.  Continue oxygen via trach collar.  SVT, sinus tachycardia, atrial fibrillation: Continue amiodarone  NSTEMI, ischemic cardiomyopathy: 2D echo showed EF estimated at 20 to 25% and grade 1 diastolic dysfunction.  Acute urinary retention: Urologist recommended keeping Foley catheter for 1 to 2 weeks from 03/16/2021.  Cystogram is recommended prior to Foley catheter removal.        Diet Order            Diet NPO time specified  Diet effective now                    Consultants:  General surgeon  [place]  ENT surgeon  Urologist  Procedures:  Tracheostomy on 03/08/2021  Sigmoid colectomy with colostomy and Hartman's procedure  Cystorrhaphy by urologist on 03/02/2019 for colovesical fistula and diverticulitis/pelvic abscess    Medications:   . amiodarone  200 mg Per Tube BID  . chlorhexidine gluconate (MEDLINE KIT)  15 mL Mouth Rinse BID  . Chlorhexidine Gluconate Cloth  6 each Topical Daily  . docusate  100 mg Per Tube BID  . enoxaparin (LOVENOX) injection  40 mg Subcutaneous Q24H  . free water  100 mL Per Tube Q4H  . insulin aspart  0-20 Units Subcutaneous Q4H  . levothyroxine  75 mcg Per Tube Q0600  . mouth rinse  15 mL Mouth Rinse 10 times per day  . midodrine  10 mg Per Tube TID WC  . multivitamin with minerals  1 tablet Per Tube Daily  . nutrition  supplement (JUVEN)  1 packet Per Tube BID BM  . pantoprazole (PROTONIX) IV  40 mg Intravenous Daily  . polyethylene glycol  17 g Per Tube Daily  . sodium chloride flush  10-40 mL Intracatheter Q12H  . sodium chloride flush  5 mL Intracatheter Q8H   Continuous Infusions: . sodium chloride Stopped (03/20/21 1026)  . feeding supplement (PIVOT 1.5 CAL)  1,000 mL (03/20/21 1740)     Anti-infectives (From admission, onward)   Start     Dose/Rate Route Frequency Ordered Stop   03/13/21 1400  meropenem (MERREM) 1 g in sodium chloride 0.9 % 100 mL IVPB        1 g 200 mL/hr over 30 Minutes Intravenous Every 8 hours 03/13/21 0855 03/16/21 2203   03/12/21 1800  meropenem (MERREM) 1 g in sodium chloride 0.9 % 100 mL IVPB  Status:  Discontinued        1 g 200 mL/hr over 30 Minutes Intravenous Every 12 hours 03/12/21 0840 03/13/21 0855   03/10/21 1000  meropenem (MERREM) 1 g in sodium chloride 0.9 % 100 mL IVPB  Status:  Discontinued        1 g 200 mL/hr over 30 Minutes Intravenous Every 8 hours 03/10/21 0854 03/12/21 0840   03/09/21 0600  fluconazole (DIFLUCAN) IVPB 200 mg  Status:  Discontinued        200 mg 100 mL/hr over 60 Minutes Intravenous Every 24 hours 03/08/21 0751 03/14/21 1014   03/08/21 2200  meropenem (MERREM) 1 g in sodium chloride 0.9 % 100 mL IVPB  Status:  Discontinued        1 g 200 mL/hr over 30 Minutes Intravenous Every 12 hours 03/08/21 0748 03/10/21 0854   03/08/21 0600  fluconazole (DIFLUCAN) IVPB 400 mg  Status:  Discontinued        400 mg 100 mL/hr over 120 Minutes Intravenous Every 24 hours 03/07/21 1321 03/08/21 0839   03/07/21 1100  meropenem (MERREM) 1 g in sodium chloride 0.9 % 100 mL IVPB  Status:  Discontinued        1 g 200 mL/hr over 30 Minutes Intravenous Every 8 hours 03/07/21 1013 03/08/21 0748   03/07/21 1000  meropenem (MERREM) 1 g in sodium chloride 0.9 % 100 mL IVPB  Status:  Discontinued        1 g 200 mL/hr over 30 Minutes Intravenous Every 12 hours 03/06/21 2243 03/07/21 1013   03/07/21 0600  fluconazole (DIFLUCAN) IVPB 400 mg        400 mg 100 mL/hr over 120 Minutes Intravenous  Once 03/06/21 2243 03/07/21 0701   03/04/21 1800  vancomycin (VANCOREADY) IVPB 750 mg/150 mL        750 mg 150 mL/hr over 60 Minutes Intravenous Every 24 hours 03/03/21 1442 03/06/21 2040   03/03/21 0800  vancomycin  (VANCOREADY) IVPB 1500 mg/300 mL  Status:  Discontinued        1,500 mg 150 mL/hr over 120 Minutes Intravenous Every 48 hours 03/02/21 1410 03/03/21 1442   03/02/21 0800  vancomycin (VANCOREADY) IVPB 750 mg/150 mL  Status:  Discontinued        750 mg 150 mL/hr over 60 Minutes Intravenous Every 24 hours 03/01/21 1954 03/02/21 1409   03/01/21 2200  ceFEPIme (MAXIPIME) 2 g in sodium chloride 0.9 % 100 mL IVPB        2 g 200 mL/hr over 30 Minutes Intravenous Every 12 hours 03/01/21 1903 03/06/21 2203   03/01/21 2100  vancomycin (VANCOREADY) IVPB 1250 mg/250 mL        1,250 mg 166.7 mL/hr over 90 Minutes Intravenous  Once 03/01/21 1954 03/02/21 0008   03/01/21 2000  metroNIDAZOLE (FLAGYL) IVPB 500 mg        500 mg 100 mL/hr over 60 Minutes Intravenous Every 8 hours 03/01/21 1903 03/06/21 2002   02/28/21 1800  piperacillin-tazobactam (ZOSYN) IVPB 3.375 g  Status:  Discontinued        3.375 g 12.5 mL/hr over 240 Minutes Intravenous Every 8 hours 02/28/21 1643 03/01/21 1903   02/28/21 1730  piperacillin-tazobactam (ZOSYN) IVPB 3.375 g  Status:  Discontinued        3.375 g 100 mL/hr over 30 Minutes Intravenous Every 8 hours 02/28/21 1635 02/28/21 1642   02/28/21 1300  Ampicillin-Sulbactam (UNASYN) 3 g in sodium chloride 0.9 % 100 mL IVPB  Status:  Discontinued        3 g 200 mL/hr over 30 Minutes Intravenous Every 6 hours 02/28/21 1156 02/28/21 1635   02/23/21 1230  piperacillin-tazobactam (ZOSYN) IVPB 3.375 g  Status:  Discontinued        3.375 g 12.5 mL/hr over 240 Minutes Intravenous Every 8 hours 02/23/21 1143 02/28/21 1155   02/22/21 0600  ciprofloxacin (CIPRO) IVPB 400 mg  Status:  Discontinued        400 mg 200 mL/hr over 60 Minutes Intravenous Every 12 hours 02/21/21 2028 02/23/21 1143   02/22/21 0400  metroNIDAZOLE (FLAGYL) IVPB 500 mg  Status:  Discontinued        500 mg 100 mL/hr over 60 Minutes Intravenous Every 8 hours 02/21/21 2028 02/23/21 1143   02/21/21 1830  ciprofloxacin  (CIPRO) IVPB 400 mg        400 mg 200 mL/hr over 60 Minutes Intravenous  Once 02/21/21 1820 02/21/21 1939   02/21/21 1830  metroNIDAZOLE (FLAGYL) IVPB 500 mg        500 mg 100 mL/hr over 60 Minutes Intravenous  Once 02/21/21 1820 02/21/21 2110             Family Communication/Anticipated D/C date and plan/Code Status   DVT prophylaxis: enoxaparin (LOVENOX) injection 40 mg Start: 03/13/21 1800 SCDs Start: 02/21/21 2200     Code Status: DNR  Family Communication: Discussed with her daughter, [name], at the bedside Disposition Plan:    Status is: Inpatient  Remains inpatient appropriate because:Inpatient level of care appropriate due to severity of illness   Dispo: The patient is from: Home              Anticipated d/c is to: SNF              Patient currently is not medically stable to d/c.   Difficult to place patient No           Subjective:   Interval events noted.  Patient has no complaints.  She said she feels comfortable.  Objective:    Vitals:   03/20/21 2030 03/21/21 0353 03/21/21 0819 03/21/21 1133  BP:  110/75 (!) 110/41 129/73  Pulse:  97 95 (!) 103  Resp:  20  18  Temp:  97.8 F (36.6 C) 98.1 F (36.7 C) 98 F (36.7 C)  TempSrc:   Oral Oral  SpO2: 100% 100%    Weight:      Height:       No data found.   Intake/Output Summary (Last 24 hours) at 03/21/2021 1442 Last data filed at 03/21/2021 1126 Gross per 24  hour  Intake 1235.91 ml  Output 1850 ml  Net -614.09 ml   Filed Weights   02/27/21 0900 02/28/21 1014 03/01/21 1511  Weight: 66.9 kg 67.2 kg 67.2 kg    Exam:  GEN: NAD SKIN: Warm and dry EYES: No pallor or icterus ENT: MMM, tracheostomy, NG tube with enteral nutrition CV: RRR PULM: CTA B ABD: soft, ND, NT, +BS, + colostomy, +wound VAC CNS: AAO x 1 (person), non focal EXT: No edema or tenderness GU: Foley catheter draining amber urine       Data Reviewed:   I have personally reviewed following labs and  imaging studies:  Labs: Labs show the following:   Basic Metabolic Panel: Recent Labs  Lab 03/17/21 0500 03/18/21 0627 03/19/21 0525 03/20/21 0530 03/21/21 0318  NA 144 144 142 139 139  K 4.2 3.7 4.4 4.1 3.7  CL 116* 117* 114* 110 110  CO2 22 22 22  20* 22  GLUCOSE 145* 156* 139* 146* 131*  BUN 43* 50* 59* 65* 60*  CREATININE 0.71 0.82 0.87 0.79 0.88  CALCIUM 8.1* 8.4* 8.5* 8.4* 8.6*  MG 2.0 1.9 2.2 1.8 1.7  PHOS 2.7 3.5 3.3 3.9 3.7   GFR Estimated Creatinine Clearance: 56.1 mL/min (by C-G formula based on SCr of 0.88 mg/dL). Liver Function Tests: Recent Labs  Lab 03/15/21 0446 03/16/21 0525  AST 170* 112*  ALT 129* 94*  ALKPHOS 89 86  BILITOT 0.8 0.5  PROT 5.7* 5.7*  ALBUMIN 2.4* 2.4*   No results for input(s): LIPASE, AMYLASE in the last 168 hours. No results for input(s): AMMONIA in the last 168 hours. Coagulation profile No results for input(s): INR, PROTIME in the last 168 hours.  CBC: Recent Labs  Lab 03/15/21 0446 03/16/21 0525 03/17/21 0500 03/18/21 0627 03/19/21 0525 03/20/21 0530 03/21/21 0318  WBC 10.0 9.4 8.4 8.5 8.0 7.5 8.3  NEUTROABS 8.2* 7.0 6.3 6.6  --   --   --   HGB 9.6* 10.1* 10.0* 9.8* 9.7* 8.8* 8.8*  HCT 29.8* 31.4* 30.7* 30.4* 30.4* 27.4* 27.3*  MCV 97.1 95.2 94.8 95.3 95.3 96.1 94.8  PLT 215 221 204 192 186 174 177   Cardiac Enzymes: No results for input(s): CKTOTAL, CKMB, CKMBINDEX, TROPONINI in the last 168 hours. BNP (last 3 results) No results for input(s): PROBNP in the last 8760 hours. CBG: Recent Labs  Lab 03/20/21 1943 03/20/21 2300 03/21/21 0352 03/21/21 0858 03/21/21 1230  GLUCAP 119* 139* 121* 176* 181*   D-Dimer: No results for input(s): DDIMER in the last 72 hours. Hgb A1c: No results for input(s): HGBA1C in the last 72 hours. Lipid Profile: No results for input(s): CHOL, HDL, LDLCALC, TRIG, CHOLHDL, LDLDIRECT in the last 72 hours. Thyroid function studies: No results for input(s): TSH, T4TOTAL,  T3FREE, THYROIDAB in the last 72 hours.  Invalid input(s): FREET3 Anemia work up: No results for input(s): VITAMINB12, FOLATE, FERRITIN, TIBC, IRON, RETICCTPCT in the last 72 hours. Sepsis Labs: Recent Labs  Lab 03/18/21 0627 03/19/21 0525 03/20/21 0530 03/21/21 0318  WBC 8.5 8.0 7.5 8.3    Microbiology No results found for this or any previous visit (from the past 240 hour(s)).  Procedures and diagnostic studies:  DG Abd 1 View  Result Date: 03/20/2021 CLINICAL DATA:  Check feeding catheter placement EXAM: ABDOMEN - 1 VIEW COMPARISON:  03/17/2021 FINDINGS: Gastric catheter is noted coiled within the stomach with the tip directed towards the fundus. The overall appearance is relatively stable from the prior exam. Ostomy  is noted in the left mid abdomen. No free air is seen. No bony abnormality is noted. IMPRESSION: Gastric catheter coiled within the stomach as described. Electronically Signed   By: Inez Catalina M.D.   On: 03/20/2021 12:39   DG Chest Port 1 View  Result Date: 03/20/2021 CLINICAL DATA:  Possible aspiration EXAM: PORTABLE CHEST 1 VIEW COMPARISON:  03/18/2021 FINDINGS: Cardiac shadow is stable. Postsurgical changes are again seen. Tracheostomy tube and gastric catheter are noted in satisfactory position. Right-sided PICC line is noted at the cavoatrial junction. The lungs are well aerated bilaterally. Mild atelectasis is again seen in the right base. No new focal infiltrate is seen. IMPRESSION: Stable appearance when compare with the prior exam. No findings to suggest aspiration are seen. Electronically Signed   By: Inez Catalina M.D.   On: 03/20/2021 12:38               LOS: 28 days   [facility]   Pager on [email or web address]. If 7PM-7AM, please contact night-coverage at [email or web address]     03/21/2021, 2:42 PM

## 2021-03-21 NOTE — Progress Notes (Signed)
[facility] Hospital Day(s): [place] op day(s): 12 Days Post-Op.   Interval History:  Patient seen and examined No acute events or new complaints overnight.  Patient is very somnolent this morning, shakes head "no" when asked about pain, daughter at bedside She remains without leukocytosis, WBC 8.3K Hgb is stable at 8.8 She has been maintaining her renal function; sCr - 0.88; UO - 1.6L No electrolyte derangements seen this morning Wound vac to midline wound; output 175 ccs; serosanguinous On tube feedings; goal rate Having colostomy function Seems DC plan remains LTACH if possible  Vital signs in last 24 hours: [min-max] current  Temp:  [97.8 F (36.6 C)-98.5 F (36.9 C)] 97.8 F (36.6 C) (04/13 0353) Pulse Rate:  [97-120] 97 (04/13 0353) Resp:  [19-20] 20 (04/13 0353) BP: (94-144)/(67-98) 110/75 (04/13 0353) SpO2:  [98 %-100 %] 100 % (04/13 0353) FiO2 (%):  [28 %] 28 % (04/12 2030)     Height: 5' 4.02" (162.6 cm) Weight: 67.2 kg BMI (Calculated): 25.42   Intake/Output last 2 shifts:  04/12 0701 - 04/13 0700 In: 9756.9 [I.V.:0.2; KD/TO:6712.4; IV Piggyback:19.1] Out: 1975 [Urine:1675; Drains:125; Stool:175]   Physical Exam:  Constitutional: Somnolent but participates in examination; daughter at bedside, NAD Respiratory: s/p tracheostomy Cardiovascular: tachycardic to 100 and sinus rhythm Gastrointestinal: soft, non-tender, and non-distended. Colostomy in the left mid-abdomen, pink/patent, stool in bag Integumentary: Midline wound healing via secondary intention; no erythema; VAC in place with good seal  Labs:  CBC Latest Ref Rng & Units 03/21/2021 03/20/2021 03/19/2021  WBC 4.0 - 10.5 K/uL 8.3 7.5 8.0  Hemoglobin 12.0 - 15.0 g/dL 8.8(L) 8.8(L) 9.7(L)  Hematocrit 36.0 - 46.0 % 27.3(L) 27.4(L) 30.4(L)  Platelets 150 - 400 K/uL 177 174 186   CMP Latest Ref Rng & Units 03/21/2021 03/20/2021 03/19/2021  Glucose 70 - 99 mg/dL 131(H)  146(H) 139(H)  BUN 8 - 23 mg/dL 60(H) 65(H) 59(H)  Creatinine 0.44 - 1.00 mg/dL 0.88 0.79 0.87  Sodium 135 - 145 mmol/L 139 139 142  Potassium 3.5 - 5.1 mmol/L 3.7 4.1 4.4  Chloride 98 - 111 mmol/L 110 110 114(H)  CO2 22 - 32 mmol/L 22 20(L) 22  Calcium 8.9 - 10.3 mg/dL 8.6(L) 8.4(L) 8.5(L)  Total Protein 6.5 - 8.1 g/dL - - -  Total Bilirubin 0.3 - 1.2 mg/dL - - -  Alkaline Phos 38 - 126 U/L - - -  AST 15 - 41 U/L - - -  ALT 0 - 44 U/L - - -     Imaging studies: No new pertinent imaging studies   Assessment/Plan:  70 y.o. female 18 Days Post-Op s/p Hartman's Procedurefor complicated diverticulitis with gangrenous changes and perforation with feculent peritonitis and 5 days s/p colostomy revision    - Okay to continue goal rate tube feedings; ? Role for PEG placement, may need to discuss with GI as there is not much room for surgical placement   - Monitor abdominal examination - Monitor colostomy output; WOC RN following - Monitor leukocytosis; resolved - Maintain foley catheter; monitor UO; d/w with urology, recommend leaving x1 month from date of surgery and performing cystogram prior to removal consideration - Midline wound care: Wound vac placed with WOC RN; MWF schedule as amenable             - Disposition plan seems to remain LTACH at this time; family/TOC appealing decision   - Further management per primary service; we will follow  peripherally for now  All of the above findings and recommendations were discussed with the patient, patient's family (daughter at bedside), and the medical team, and all of patient's family's questions were answered to their expressed satisfaction.  -- Edison Simon, PA-C [facility] 03/21/2021, 7:38 AM [phone] M-F: 7am - 4pm

## 2021-03-21 NOTE — TOC Progression Note (Signed)
Transition of Care [facility]) - Progression Note    Patient Details  Name: Desiree Madden MRN: [id number] Date of Birth: [birth year]  [facility] of [facility]) CM/SW Contact  Kerin Salen, RN Phone Number: 03/21/2021, 2:07 PM  Clinical Narrative:  Called and spoke with daughter, [name] to inform her that the Appeal for LTAC was denied and the choice we have is to try SNF referral or discharge home. Daughter prefers SNF. Placed PT/OT consult will keep daughter updated with process.         Expected Discharge Plan and Services                                                 Social Determinants of Health (SDOH) Interventions    Readmission Risk Interventions No flowsheet data found.

## 2021-03-21 NOTE — Progress Notes (Signed)
[facility] for Electrolyte Monitoring and Replacement   Recent Labs: Potassium (mmol/L)  Date Value  03/21/2021 3.7   Magnesium (mg/dL)  Date Value  03/21/2021 1.7   Calcium (mg/dL)  Date Value  03/21/2021 8.6 (L)   Albumin (g/dL)  Date Value  03/16/2021 2.4 (L)   Phosphorus (mg/dL)  Date Value  03/21/2021 3.7   Sodium (mmol/L)  Date Value  03/21/2021 139   Assessment: Desiree Madden is a 70 y.o. female admitted on 02/21/2021 with intra-abdominal infection (diverticulitis w/ fecal perforation). Patient septic with acute diverticulitis abdominal abscess with colovesical fistula and enterococcus urinary tract infection which was further complicated by the development of complicated diverticulitis with perforation and gangrenous changes requiring Hartman's procedure performed on 03/01/21. PMH includes CAD s/p CABG (2015), HTN, hypothyroidism, colon bladder fistula, diverticulitis, and ulcerative colitis. Pharmacy was consulted for electrolyte monitoring.  Nutrition: Continous tube feeds @ 50 mL/hr (formulation changed 4/12)  Free water 100 mL per tube every 4 hours  Goal of Therapy:  K 4.0 - 5.1 mmol/L Mg 2.0 - 2.4 mg/dL All Other Electrolytes WNL  Plan:   K 3.7, will order KCl 40 mEq per tube x 1  Mg 1.7 will order IV magnesium sulfate 2 g x 1  Sodium stable, BUN:SCr ratio elevated at 68:  Continue free water to 100 mL per tube every 4 hours  Re-check electrolytes with AM labs  Benita Gutter 03/21/2021 7:40 AM

## 2021-03-22 ENCOUNTER — Encounter: Payer: Self-pay | Admitting: Internal Medicine

## 2021-03-22 ENCOUNTER — Inpatient Hospital Stay: Payer: Medicare HMO

## 2021-03-22 DIAGNOSIS — K5792 Diverticulitis of intestine, part unspecified, without perforation or abscess without bleeding: Secondary | ICD-10-CM | POA: Diagnosis not present

## 2021-03-22 DIAGNOSIS — E43 Unspecified severe protein-calorie malnutrition: Secondary | ICD-10-CM | POA: Diagnosis not present

## 2021-03-22 LAB — PHOSPHORUS: Phosphorus: 3.6 mg/dL (ref 2.5–4.6)

## 2021-03-22 LAB — GLUCOSE, CAPILLARY
Glucose-Capillary: 144 mg/dL — ABNORMAL HIGH (ref 70–99)
Glucose-Capillary: 125 mg/dL — ABNORMAL HIGH (ref 70–99)
Glucose-Capillary: 174 mg/dL — ABNORMAL HIGH (ref 70–99)
Glucose-Capillary: 118 mg/dL — ABNORMAL HIGH (ref 70–99)
Glucose-Capillary: 125 mg/dL — ABNORMAL HIGH (ref 70–99)

## 2021-03-22 LAB — BASIC METABOLIC PANEL
Anion gap: 7 (ref 5–15)
BUN: 59 mg/dL — ABNORMAL HIGH (ref 8–23)
CO2: 23 mmol/L (ref 22–32)
Calcium: 8.6 mg/dL — ABNORMAL LOW (ref 8.9–10.3)
Chloride: 111 mmol/L (ref 98–111)
Creatinine, Ser: 0.9 mg/dL (ref 0.44–1.00)
GFR, Estimated: >60 mL/min (ref >60)
Glucose, Bld: 136 mg/dL — ABNORMAL HIGH (ref 70–99)
Potassium: 4.8 mmol/L (ref 3.5–5.1)
Sodium: 141 mmol/L (ref 135–145)

## 2021-03-22 LAB — CBC
HCT: 29.6 % — ABNORMAL LOW (ref 36.0–46.0)
Hemoglobin: 9.6 g/dL — ABNORMAL LOW (ref 12.0–15.0)
MCH: 31.1 pg (ref 26.0–34.0)
MCHC: 32.4 g/dL (ref 30.0–36.0)
MCV: 95.8 fL (ref 80.0–100.0)
Platelets: 204 10*3/uL (ref 150–400)
RBC: 3.09 MIL/uL — ABNORMAL LOW (ref 3.87–5.11)
RDW: 17.5 % — ABNORMAL HIGH (ref 11.5–15.5)
WBC: 7.5 10*3/uL (ref 4.0–10.5)
nRBC: 0 % (ref 0.0–0.2)

## 2021-03-22 LAB — MAGNESIUM: Magnesium: 2.3 mg/dL (ref 1.7–2.4)

## 2021-03-22 MED ORDER — PANTOPRAZOLE SODIUM 40 MG PO TBEC
40.0000 mg | DELAYED_RELEASE_TABLET | Freq: Every day | ORAL | Status: DC
  Administered 2021-03-23 – 2021-04-03 (×12): 40 mg via ORAL
  Filled 2021-03-22 (×12): qty 1

## 2021-03-22 NOTE — Progress Notes (Signed)
  Speech Language Pathology Treatment: Nada Boozer Speaking valve  Patient Details Name: Desiree Madden MRN: [id number] DOB: 11-01-1951 Today's Date: 03/22/2021 Time: 0762-2633 SLP Time Calculation (min) (ACUTE ONLY): 40 min  Assessment / Plan / Recommendation Clinical Impression  Pt seen for ongoing assessment to determine readiness for use of PMV for verbal communication. Pt has been demonstrating improvement in Alertness which will support tolerance of wear for verbal communication w/ others. Pt's Alertness is much improved today w/ eye contact and answering of basic questions.  Tracheostomy was placed on 03/08/2021 after ~2 weeks of oral intubation and failing vent wean trials. Pt is now on Trach Collar O2 support, FiO2 28% 5L, weaned from full vent support on 03/15/2021. Pt has NG tube for nutritional needs currently. Pt has a Shiley size 6 tube Cuffed, but Cuff is fully deflated at Baseline now.  Pt presented w/ Full Alertness and engaged w/ this SLP given only min verbal/tactile cues for follow through intermittently during session -- pt appears to have min Confusion suspect related to lengthy illness. Oral care given w/ appropriate lingual/labial responses. Post determining full Cuff deflation, PMV was placed. O2 sats remained 99-100%, RR ~19, HR ~104 as at her Baseline. Pt practiced "loud" phonations of "ahh" and Automatic speech tasks w/ SLP including counting, DOW w/ approprirate vocal cord contact and vocal quality. Volume of phonations and speech appropriate. Pt then spoke w/ SLP in basic conversation and followed through w/ general 1-step instructions w/ fair-good accuracy. No ANS changes: HR low 100s, RR 18-22, O2 sats 99-100% during session. No air trapping s/s noted nor distress. A dry cough was noted x2 during wear but not productive.  Pt would benefit from PMSV trials for assessment another session w/ SLP to determine readiness to use PMV PRN w/ staff and family for verbal communication.  Will follow up tomorrow. NSG and MD updated.      HPI HPI: Pt is a 70 y.o. female who admitted on 02/21/2021 w/ Diverticulitis with Colovesical fistula and UTI, now 12 Days Post-Op s/p Hartman's Procedure for complicated diverticulitis with gangrenous changes and perforation with feculent peritonitis and 4 days s/p colostomy revision.  Pt remained on vent failing wean; Tracheostomy placed on 03/08/2021.  Pt is now on Trach Collar O2 support weaning from full vent support on 03/15/2021.  Ongoing Wound Care and NG tube.      SLP Plan  Continue with current plan of care       Recommendations  Diet recommendations: NPO (NG TFs) Medication Administration: Via alternative means (NG)      Patient may use Passy-Muir Speech Valve: with SLP only PMSV Supervision: Full MD: Please consider changing trach tube to : Cuffless         General recommendations:  (Rehab following) Oral Care Recommendations: Oral care QID;Staff/trained caregiver to provide oral care Follow up Recommendations: Skilled Nursing facility (TBD) SLP Visit Diagnosis: Aphonia (R49.1) (tracheostomy) Plan: Continue with current plan of care       GO                  Desiree Madden, Desiree Madden, Desiree Madden Pathologist Rehab Services [phone] [facility] 03/22/2021, 2:49 PM

## 2021-03-22 NOTE — TOC Benefit Eligibility Note (Signed)
Transition of Care [facility]) Benefit Eligibility Note    Patient Details  Name: Desiree Madden MRN: [id number] Date of Birth: 15-May-1951   Medication/Dose: Eliquis 5mg  BID  Covered?: Yes  Tier: 3 Drug  Prescription Coverage Preferred Pharmacy: Any in-network, [facility] in [place] is fine  Spoke with Person/Company/Phone Number:: [employer], [phone]  Co-Pay: $45 for 30 day supply or $125 for 90 day supply  Prior Approval: No  Deductible:  (No deductible)       [place] Phone Number: 03/22/2021, 10:57 AM

## 2021-03-22 NOTE — Progress Notes (Signed)
Progress Note    Desiree Madden  JIR:[id number] DOB: 15-Dec-1950  DOA: 02/21/2021 PCP: [place]      Brief Narrative:    Medical records reviewed and are as summarized below:  Desiree Madden is a 70 y.o. female with medical history significant for colonic bladder fistula, diverticulitis, hypertension, hypothyroidism, hyperlipidemia, ulcerative colitis, who presented to the hospital on 02/21/2021 because of abdominal pain.  She was admitted to the hospital for acute diverticulitis.  Hospital course was complicated by perforated diverticulitis and she underwent emergent sigmoid colectomy with end colostomy and Hartman's procedure on 03/01/2021.  She was transferred to the ICU postoperatively.  She developed septic shock requiring multiple vasopressors.  She remained intubated on mechanical ventilation postoperatively.  She could not be liberated from the ventilator.  She failed extubation on 03/06/2021 and was reintubated.  ENT was consulted and tracheostomy was performed on 03/08/2021.      Assessment/Plan:   Principal Problem:   Diverticulitis Active Problems:   Fistula, bladder   Ulcerative colitis ([place])   Hyperlipidemia   Essential hypertension   Hypothyroidism   Abnormal ECG   CKD (chronic kidney disease), stage III (HCC)   Elevated lactic acid level   Protein-calorie malnutrition, severe   Nutrition Problem: Severe Malnutrition Etiology: acute illness  Signs/Symptoms: moderate fat depletion,moderate muscle depletion,severe muscle depletion   Body mass index is 21.82 kg/m.     Septic shock, hemorrhagic shock secondary to complicated diverticulitis with perforation, pelvic abscess, gangrene of the sigmoid colon: Shock physiology resolved.  S/p sigmoid colectomy with end colostomy, Hartman's procedure on 03/01/2021.  Completed antibiotics on 03/15/2021.  She has a wound VAC in place.  Continue local wound care.  Follow-up with general surgeon.      Dysphagia, Severe malnutrition: NG tube was removed today and enteral nutrition has only discontinued.  Patient underwent modified barium swallow study and according to speech therapist, swallowing has improved.  Dysphagia 1 diet and nectar thick liquids were recommended.  Follow-up with speech therapist for further recommendations.  Acute on chronic hypoxic respiratory failure: Failure to wean from ventilator requiring tracheostomy.  Continue oxygen via trach collar.  SVT, sinus tachycardia, atrial fibrillation: Continue amiodarone  NSTEMI, ischemic cardiomyopathy, chronic systolic and diastolic CHF: 2D echo showed EF estimated at 20 to 25% and grade 1 diastolic dysfunction.  Acute urinary retention: Urologist recommended keeping Foley catheter for 1 to 2 weeks from 03/16/2021.  Cystogram is recommended prior to Foley catheter removal.        Diet Order            DIET - DYS 1 Room service appropriate? Yes with Assist; Fluid consistency: Nectar Thick  Diet effective now                    Consultants:  General surgeon  [place]  ENT surgeon  Urologist  Procedures:  Tracheostomy on 03/08/2021  Sigmoid colectomy with colostomy and Hartman's procedure  Cystorrhaphy by urologist on 03/02/2019 for colovesical fistula and diverticulitis/pelvic abscess    Medications:   . amiodarone  200 mg Per Tube BID  . chlorhexidine gluconate (MEDLINE KIT)  15 mL Mouth Rinse BID  . Chlorhexidine Gluconate Cloth  6 each Topical Daily  . docusate  100 mg Per Tube BID  . enoxaparin (LOVENOX) injection  40 mg Subcutaneous Q24H  . insulin aspart  0-20 Units Subcutaneous Q4H  . levothyroxine  75 mcg Per Tube Q0600  . mouth rinse  15 mL  Mouth Rinse 10 times per day  . midodrine  10 mg Per Tube TID WC  . multivitamin with minerals  1 tablet Per Tube Daily  . nutrition supplement (JUVEN)  1 packet Per Tube BID BM  . pantoprazole (PROTONIX) IV  40 mg Intravenous Daily  .  polyethylene glycol  17 g Per Tube Daily  . sodium chloride flush  10-40 mL Intracatheter Q12H  . sodium chloride flush  5 mL Intracatheter Q8H   Continuous Infusions: . sodium chloride Stopped (03/20/21 1026)  . feeding supplement (PIVOT 1.5 CAL) 1,000 mL (03/20/21 1740)     Anti-infectives (From admission, onward)   Start     Dose/Rate Route Frequency Ordered Stop   03/13/21 1400  meropenem (MERREM) 1 g in sodium chloride 0.9 % 100 mL IVPB        1 g 200 mL/hr over 30 Minutes Intravenous Every 8 hours 03/13/21 0855 03/16/21 2203   03/12/21 1800  meropenem (MERREM) 1 g in sodium chloride 0.9 % 100 mL IVPB  Status:  Discontinued        1 g 200 mL/hr over 30 Minutes Intravenous Every 12 hours 03/12/21 0840 03/13/21 0855   03/10/21 1000  meropenem (MERREM) 1 g in sodium chloride 0.9 % 100 mL IVPB  Status:  Discontinued        1 g 200 mL/hr over 30 Minutes Intravenous Every 8 hours 03/10/21 0854 03/12/21 0840   03/09/21 0600  fluconazole (DIFLUCAN) IVPB 200 mg  Status:  Discontinued        200 mg 100 mL/hr over 60 Minutes Intravenous Every 24 hours 03/08/21 0751 03/14/21 1014   03/08/21 2200  meropenem (MERREM) 1 g in sodium chloride 0.9 % 100 mL IVPB  Status:  Discontinued        1 g 200 mL/hr over 30 Minutes Intravenous Every 12 hours 03/08/21 0748 03/10/21 0854   03/08/21 0600  fluconazole (DIFLUCAN) IVPB 400 mg  Status:  Discontinued        400 mg 100 mL/hr over 120 Minutes Intravenous Every 24 hours 03/07/21 1321 03/08/21 0839   03/07/21 1100  meropenem (MERREM) 1 g in sodium chloride 0.9 % 100 mL IVPB  Status:  Discontinued        1 g 200 mL/hr over 30 Minutes Intravenous Every 8 hours 03/07/21 1013 03/08/21 0748   03/07/21 1000  meropenem (MERREM) 1 g in sodium chloride 0.9 % 100 mL IVPB  Status:  Discontinued        1 g 200 mL/hr over 30 Minutes Intravenous Every 12 hours 03/06/21 2243 03/07/21 1013   03/07/21 0600  fluconazole (DIFLUCAN) IVPB 400 mg        400 mg 100 mL/hr  over 120 Minutes Intravenous  Once 03/06/21 2243 03/07/21 0701   03/04/21 1800  vancomycin (VANCOREADY) IVPB 750 mg/150 mL        750 mg 150 mL/hr over 60 Minutes Intravenous Every 24 hours 03/03/21 1442 03/06/21 2040   03/03/21 0800  vancomycin (VANCOREADY) IVPB 1500 mg/300 mL  Status:  Discontinued        1,500 mg 150 mL/hr over 120 Minutes Intravenous Every 48 hours 03/02/21 1410 03/03/21 1442   03/02/21 0800  vancomycin (VANCOREADY) IVPB 750 mg/150 mL  Status:  Discontinued        750 mg 150 mL/hr over 60 Minutes Intravenous Every 24 hours 03/01/21 1954 03/02/21 1409   03/01/21 2200  ceFEPIme (MAXIPIME) 2 g in sodium chloride 0.9 %  100 mL IVPB        2 g 200 mL/hr over 30 Minutes Intravenous Every 12 hours 03/01/21 1903 03/06/21 2203   03/01/21 2100  vancomycin (VANCOREADY) IVPB 1250 mg/250 mL        1,250 mg 166.7 mL/hr over 90 Minutes Intravenous  Once 03/01/21 1954 03/02/21 0008   03/01/21 2000  metroNIDAZOLE (FLAGYL) IVPB 500 mg        500 mg 100 mL/hr over 60 Minutes Intravenous Every 8 hours 03/01/21 1903 03/06/21 2002   02/28/21 1800  piperacillin-tazobactam (ZOSYN) IVPB 3.375 g  Status:  Discontinued        3.375 g 12.5 mL/hr over 240 Minutes Intravenous Every 8 hours 02/28/21 1643 03/01/21 1903   02/28/21 1730  piperacillin-tazobactam (ZOSYN) IVPB 3.375 g  Status:  Discontinued        3.375 g 100 mL/hr over 30 Minutes Intravenous Every 8 hours 02/28/21 1635 02/28/21 1642   02/28/21 1300  Ampicillin-Sulbactam (UNASYN) 3 g in sodium chloride 0.9 % 100 mL IVPB  Status:  Discontinued        3 g 200 mL/hr over 30 Minutes Intravenous Every 6 hours 02/28/21 1156 02/28/21 1635   02/23/21 1230  piperacillin-tazobactam (ZOSYN) IVPB 3.375 g  Status:  Discontinued        3.375 g 12.5 mL/hr over 240 Minutes Intravenous Every 8 hours 02/23/21 1143 02/28/21 1155   02/22/21 0600  ciprofloxacin (CIPRO) IVPB 400 mg  Status:  Discontinued        400 mg 200 mL/hr over 60 Minutes Intravenous  Every 12 hours 02/21/21 2028 02/23/21 1143   02/22/21 0400  metroNIDAZOLE (FLAGYL) IVPB 500 mg  Status:  Discontinued        500 mg 100 mL/hr over 60 Minutes Intravenous Every 8 hours 02/21/21 2028 02/23/21 1143   02/21/21 1830  ciprofloxacin (CIPRO) IVPB 400 mg        400 mg 200 mL/hr over 60 Minutes Intravenous  Once 02/21/21 1820 02/21/21 1939   02/21/21 1830  metroNIDAZOLE (FLAGYL) IVPB 500 mg        500 mg 100 mL/hr over 60 Minutes Intravenous  Once 02/21/21 1820 02/21/21 2110             Family Communication/Anticipated D/C date and plan/Code Status   DVT prophylaxis: enoxaparin (LOVENOX) injection 40 mg Start: 03/13/21 1800 SCDs Start: 02/21/21 2200     Code Status: DNR  Family Communication: Discussed with her daughter, [name], at the bedside Disposition Plan:    Status is: Inpatient  Remains inpatient appropriate because:Inpatient level of care appropriate due to severity of illness   Dispo: The patient is from: Home              Anticipated d/c is to: SNF              Patient currently is not medically stable to d/c.   Difficult to place patient No           Subjective:   Interval events noted.  She feels better.  No new complaints.  Objective:    Vitals:   03/22/21 0500 03/22/21 0601 03/22/21 0835 03/22/21 1219  BP: 115/66  124/73 (!) 110/58  Pulse: 94  99   Resp: 19  18   Temp: 97.8 F (36.6 C)  97.7 F (36.5 C)   TempSrc: Axillary  Oral   SpO2: 100% 100% 100%   Weight: 57.7 kg     Height:  No data found.   Intake/Output Summary (Last 24 hours) at 03/22/2021 1538 Last data filed at 03/22/2021 1201 Gross per 24 hour  Intake 270 ml  Output 1850 ml  Net -1580 ml   Filed Weights   02/28/21 1014 03/01/21 1511 03/22/21 0500  Weight: 67.2 kg 67.2 kg 57.7 kg    Exam:  GEN: NAD SKIN: Warm and dry EYES: No pallor or icterus ENT: MMM, + tracheostomy, + NG tube CV: RRR PULM: CTA B ABD: soft, ND, NT, +BS, + colostomy, +  wound vac CNS: AAO x 2 (person and place), non focal EXT: No edema or tenderness GU: Foley catheter draining amber urine        Data Reviewed:   I have personally reviewed following labs and imaging studies:  Labs: Labs show the following:   Basic Metabolic Panel: Recent Labs  Lab 03/18/21 0627 03/19/21 0525 03/20/21 0530 03/21/21 0318 03/22/21 0616  NA 144 142 139 139 141  K 3.7 4.4 4.1 3.7 4.8  CL 117* 114* 110 110 111  CO2 22 22 20* 22 23  GLUCOSE 156* 139* 146* 131* 136*  BUN 50* 59* 65* 60* 59*  CREATININE 0.82 0.87 0.79 0.88 0.90  CALCIUM 8.4* 8.5* 8.4* 8.6* 8.6*  MG 1.9 2.2 1.8 1.7 2.3  PHOS 3.5 3.3 3.9 3.7 3.6   GFR Estimated Creatinine Clearance: 50.2 mL/min (by C-G formula based on SCr of 0.9 mg/dL). Liver Function Tests: Recent Labs  Lab 03/16/21 0525  AST 112*  ALT 94*  ALKPHOS 86  BILITOT 0.5  PROT 5.7*  ALBUMIN 2.4*   No results for input(s): LIPASE, AMYLASE in the last 168 hours. No results for input(s): AMMONIA in the last 168 hours. Coagulation profile No results for input(s): INR, PROTIME in the last 168 hours.  CBC: Recent Labs  Lab 03/16/21 0525 03/17/21 0500 03/18/21 0627 03/19/21 0525 03/20/21 0530 03/21/21 0318 03/22/21 0616  WBC 9.4 8.4 8.5 8.0 7.5 8.3 7.5  NEUTROABS 7.0 6.3 6.6  --   --   --   --   HGB 10.1* 10.0* 9.8* 9.7* 8.8* 8.8* 9.6*  HCT 31.4* 30.7* 30.4* 30.4* 27.4* 27.3* 29.6*  MCV 95.2 94.8 95.3 95.3 96.1 94.8 95.8  PLT 221 204 192 186 174 177 204   Cardiac Enzymes: No results for input(s): CKTOTAL, CKMB, CKMBINDEX, TROPONINI in the last 168 hours. BNP (last 3 results) No results for input(s): PROBNP in the last 8760 hours. CBG: Recent Labs  Lab 03/21/21 2019 03/21/21 2327 03/22/21 0459 03/22/21 0816 03/22/21 1214  GLUCAP 141* 141* 144* 125* 174*   D-Dimer: No results for input(s): DDIMER in the last 72 hours. Hgb A1c: No results for input(s): HGBA1C in the last 72 hours. Lipid Profile: No  results for input(s): CHOL, HDL, LDLCALC, TRIG, CHOLHDL, LDLDIRECT in the last 72 hours. Thyroid function studies: No results for input(s): TSH, T4TOTAL, T3FREE, THYROIDAB in the last 72 hours.  Invalid input(s): FREET3 Anemia work up: No results for input(s): VITAMINB12, FOLATE, FERRITIN, TIBC, IRON, RETICCTPCT in the last 72 hours. Sepsis Labs: Recent Labs  Lab 03/19/21 0525 03/20/21 0530 03/21/21 0318 03/22/21 0616  WBC 8.0 7.5 8.3 7.5    Microbiology No results found for this or any previous visit (from the past 240 hour(s)).  Procedures and diagnostic studies:  No results found.             LOS: 29 days   [employer], advertising on [email or web address]. [email or web address]  7PM-7AM, please contact night-coverage at [email or web address]     03/22/2021, 3:38 PM

## 2021-03-22 NOTE — Progress Notes (Signed)
[facility] for Electrolyte Monitoring and Replacement   Recent Labs: Potassium (mmol/L)  Date Value  03/22/2021 4.8   Magnesium (mg/dL)  Date Value  03/22/2021 2.3   Calcium (mg/dL)  Date Value  03/22/2021 8.6 (L)   Albumin (g/dL)  Date Value  03/16/2021 2.4 (L)   Phosphorus (mg/dL)  Date Value  03/22/2021 3.6   Sodium (mmol/L)  Date Value  03/22/2021 141   Assessment: Desiree Madden is a 70 y.o. female admitted on 02/21/2021 with intra-abdominal infection (diverticulitis w/ fecal perforation). Patient septic with acute diverticulitis abdominal abscess with colovesical fistula and enterococcus urinary tract infection which was further complicated by the development of complicated diverticulitis with perforation and gangrenous changes requiring Hartman's procedure performed on 03/01/21. PMH includes CAD s/p CABG (2015), HTN, hypothyroidism, colon bladder fistula, diverticulitis, and ulcerative colitis. Pharmacy was consulted for electrolyte monitoring.  Nutrition: Continous tube feeds @ 50 mL/hr (formulation changed 4/12)  Free water 100 mL per tube every 4 hours  Goal of Therapy:  K 4.0 - 5.1 mmol/L Mg 2.0 - 2.4 mg/dL All Other Electrolytes WNL  Plan:   K 4.8, sample was hemolyzed  No electrolyte replacement warranted today  Sodium stable, BUN:SCr ratio elevated at 65:  Continue free water to 100 mL per tube every 4 hours  Re-check electrolytes with AM labs  Benita Gutter 03/22/2021 8:18 AM

## 2021-03-22 NOTE — Progress Notes (Signed)
Physical Therapy Treatment Patient Details Name: Desiree Madden MRN: [id number] DOB: [birth year] Today's Date: 03/22/2021    History of Present Illness Desiree Madden is a 70 y.o. female with medical history significant for colonic bladder fistula, diverticulitis, hypertension, hypothyroidism, hyperlipidemia, ulcerative colitis, who presented to the hospital on 02/21/2021 because of abdominal pain.  She was admitted to the hospital for acute diverticulitis.  Hospital course was complicated by perforated diverticulitis and she underwent emergent sigmoid colectomy with end colostomy and Hartman's procedure on 03/01/2021.  She was transferred to the ICU postoperatively.  She developed septic shock requiring multiple vasopressors.  She remained intubated on mechanical ventilation postoperatively.  She could not be liberated from the ventilator.  She failed extubation on 03/06/2021 and was reintubated.  ENT was consulted and tracheostomy was performed on 03/08/2021.   Ultimately extubated 4/8.    PT Comments    Pt eager to work with PT but continues to struggle with activity tolerance and fatigued quickly with both LE exercises and with brief bout of sitting EOB with modest reaching activities. Pt's O2 remained in the high 90s on trach O2, HR generally staying in the 100bpm range.  Pt continues to display L ankle PF/inversion posture, prevlon boot re-applied post session.     Follow Up Recommendations  SNF     Equipment Recommendations       Recommendations for Other Services Rehab consult     Precautions / Restrictions Precautions Precautions: Fall Restrictions Weight Bearing Restrictions: No    Mobility  Bed Mobility Overal bed mobility: Needs Assistance Bed Mobility: Supine to Sit   Sidelying to sit: Max assist Supine to sit: Max assist   Sit to sidelying: Max assist General bed mobility comments: Pt showed some effort in getting to sidelying and then to sitting, but ultimately she  needed heavy assist t/o the entire transition.    Transfers                 General transfer comment: deferred as yesterday's attempts were unsuccesful  Ambulation/Gait                 Stairs             Wheelchair Mobility    Modified Rankin (Stroke Patients Only)       Balance Overall balance assessment: Needs assistance Sitting-balance support: No upper extremity supported Sitting balance-Leahy Scale: Fair Sitting balance - Comments: Pt with better sitting balance/tolerance today.  She was able to do a few reaching outside of BOS activities but fatigued quickly.  Total of ~3 minutes sitting with no overt LOBs but increasing complaints of fatigue t/o the time sitting EOB       Standing balance comment: deferred                            Cognition Arousal/Alertness: Awake/alert Behavior During Therapy: Restless Overall Cognitive Status: Impaired/Different from baseline                                 General Comments: this PT saw pt ~1 month ago, despite limitations from speaking valve she appears so be confused and lacking full understanding of her situation      Exercises General Exercises - Lower Extremity Ankle Circles/Pumps: PROM;AROM;10 reps;AAROM (pt was able to do some inconsistent AAROM L ankle DF/eversion) Javier Docker Sets: Strengthening;10 reps Short Arc Quad: AROM;10 reps Heel  Slides: AROM;10 reps (lightly resisted leg ext) Hip ABduction/ADduction: Strengthening;10 reps Straight Leg Raises: AAROM;5 reps    General Comments        Pertinent Vitals/Pain Pain Assessment: No/denies pain    Home Living                      Prior Function            PT Goals (current goals can now be found in the care plan section) Progress towards PT goals: Progressing toward goals    Frequency    Min 2X/week      PT Plan Current plan remains appropriate    Co-evaluation              AM-PAC PT "6  Clicks" Mobility   Outcome Measure  Help needed turning from your back to your side while in a flat bed without using bedrails?: A Lot Help needed moving from lying on your back to sitting on the side of a flat bed without using bedrails?: Total Help needed moving to and from a bed to a chair (including a wheelchair)?: Total Help needed standing up from a chair using your arms (e.g., wheelchair or bedside chair)?: Total Help needed to walk in hospital room?: Total Help needed climbing 3-5 steps with a railing? : Total 6 Click Score: 7    End of Session   Activity Tolerance: Patient tolerated treatment well;Patient limited by fatigue Patient left: with bed alarm set;with call bell/phone within reach Nurse Communication: Mobility status PT Visit Diagnosis: Muscle weakness (generalized) (M62.81);Difficulty in walking, not elsewhere classified (R26.2);Apraxia (R48.2);Unsteadiness on feet (R26.81)     Time: 1634-1700 PT Time Calculation (min) (ACUTE ONLY): 26 min  Charges:  $Gait Training: 8-22 mins $Therapeutic Exercise: 8-22 mins                     Kreg Shropshire, DPT 03/22/2021, 6:23 PM

## 2021-03-22 NOTE — Evaluation (Addendum)
Objective Swallowing Evaluation: Type of Study: MBS-Modified Barium Swallow Study   Patient Details  Name: Desiree Madden MRN: [id number] Date of Birth: 1951-04-11  Today's Date: 03/22/2021 Time: SLP Start Time (ACUTE ONLY): 0165 -SLP Stop Time (ACUTE ONLY): 1445  SLP Time Calculation (min) (ACUTE ONLY): 60 min   Past Medical History:  Past Medical History:  Diagnosis Date  . Hypertension   . Thyroid disease    Past Surgical History:  Past Surgical History:  Procedure Laterality Date  . CARDIAC SURGERY    . COLECTOMY WITH COLOSTOMY CREATION/HARTMANN PROCEDURE Left 03/01/2021   Procedure: COLECTOMY WITH COLOSTOMY CREATION/HARTMANN PROCEDURE;  Surgeon: Ronny Bacon, MD;  Location: [facility];  Service: General;  Laterality: Left;  . COLOSTOMY REVISION N/A 03/09/2021   Procedure: COLOSTOMY REVISION;  Surgeon: Ronny Bacon, MD;  Location: [facility];  Service: General;  Laterality: N/A;  . TRACHEOSTOMY TUBE PLACEMENT N/A 03/08/2021   Procedure: TRACHEOSTOMY;  Surgeon: Clyde Canterbury, MD;  Location: [facility] OR[facility];  Service: ENT;  Laterality: N/A;   HPI: Pt is a 70 y.o. female who admitted on 02/21/2021 w/ Diverticulitis with Colovesical fistula and UTI, now 57 Days Post-Op s/p Hartman's Procedure for complicated diverticulitis with gangrenous changes and perforation with feculent peritonitis and 4 days s/p colostomy revision.  Pt remained on vent failing wean; Tracheostomy placed on 03/08/2021.  Pt is now on Trach Collar O2 support weaning from full vent support on 03/15/2021.  Ongoing Wound Care and NG tube.   Subjective: pt much more awake, mouthing/talking w/ air leak. Confusion noted but able to follow basic instructions w/ cues. PMV placed w/out difficulty.    Assessment / Plan / Recommendation  CHL IP CLINICAL IMPRESSIONS 03/22/2021  Clinical Impression Patient presents with moderate oropharyngeal phase dysphagia w/ ioncreased risk for aspiration at this time -- suspect impacted from Min+  decline in Cognitive status as well as lengthy illness/hospitalization. Pt also has a Tracheostomy and is on 5L O2 support via Physicist, medical. She is able to wear the PMV (speaking valve) w/ good tolerance w/ no decline in vitals, ANS. Respiratory status remained calm and stable during this evaluation w/ the PMV placed during po trials. Pt required occasional verbal cues and Time during exam for follow through w/ instructions. During the Oral phase of swallowing, pt exhibited prolonged bolus formation w/ min "Munching" behavior w/ increased textured foods(Minced), increased lingual pumping/manipulation, delayed anterior to posterior transit. Intermittent piecemealing of boluses. Also, diffuse oral residue was noted w/ all consistencies. A lingual sweep and f/u, DRY swallow cleared the residue. During the Pharyngeal phase of swallowing. swallow initiation appeared just mildly delayed at the level of the Valleculae with trials of Nectar liquids and purees/minced foods. Despite this delay, when pt initiated the pharyngeal swallow, it was adequate for tongue base retraction, hyolaryngeal excursion, and pharyngeal constriction. No significant pharyngeal residue remained. Any slight+ amount of residue noted in the valleculae was cleared w/ a f/u, DRY swallow.  With thin liquids, pt's swallow initiation was much more delayed at the pyriform sinuses(MOD+), though no penetration or aspiration was noted w/ the 1 trial given(suspect increased risk for such though w/ further trials). Cervical esophageal phase was grossly unremarkable given limited view due to pt's shoulders. Pt helped to feed self by Holding the Cup when drinking. Verbal cues necessary for use of DRY swallow stratgy and aspiration precautions.  Given pt's current presentation w/ Cognitive/mental status decline, deconditioning and lengthy time w/out oral diet, and Pulmonary status w/ continued tracheostomy and using the  PMV, recommend a Dysphagia 1 diet with  Nectar thick liquids, aspiration precautions, Pills Crushed in Puree, full supervision and support w/ feeding and for verbal cues when needed for attention and follow through w/ tasks. PT MUST WEAR THE PMV WITH ALL ORAL INTAKE. Pt would benefit from repeat MBSS in future as strength, medical and Cognitive status' improve to determine appropriateness for advancement to thin liquids and more solid foods.  SLP Visit Diagnosis Dysphagia, oropharyngeal phase (R13.12)  Attention and concentration deficit following --  Frontal lobe and executive function deficit following --  Impact on safety and function Mild aspiration risk;Risk for inadequate nutrition/hydration      CHL IP TREATMENT RECOMMENDATION 03/22/2021  Treatment Recommendations Therapy as outlined in treatment plan below     Prognosis 03/22/2021  Prognosis for Safe Diet Advancement Fair  Barriers to Reach Goals Cognitive deficits;Severity of deficits;Time post onset  Barriers/Prognosis Comment Tracheostomy; PMV wear    CHL IP DIET RECOMMENDATION 03/22/2021  SLP Diet Recommendations Dysphagia 1 (Puree) solids;Nectar thick liquid  Liquid Administration via Cup;No straw  Medication Administration Crushed with puree  Compensations Minimize environmental distractions;Slow rate;Small sips/bites;Lingual sweep for clearance of pocketing;Multiple dry swallows after each bite/sip;Follow solids with liquid  Postural Changes Remain semi-upright after after feeds/meals (Comment);Seated upright at 90 degrees      CHL IP OTHER RECOMMENDATIONS 03/22/2021  Recommended Consults (No Data)  Oral Care Recommendations Oral care BID;Oral care before and after PO;Staff/trained caregiver to provide oral care  Other Recommendations Order thickener from pharmacy;Prohibited food (jello, ice cream, thin soups);Remove water pitcher;Have oral suction available      CHL IP FOLLOW UP RECOMMENDATIONS 03/22/2021  Follow up Recommendations Skilled Nursing facility       [facility] IP FREQUENCY AND DURATION 03/22/2021  Speech Therapy Frequency (ACUTE ONLY) min 3x week  Treatment Duration 2 weeks           CHL IP ORAL PHASE 03/22/2021  Oral Phase Impaired  Oral - Pudding Teaspoon --  Oral - Pudding Cup --  Oral - Honey Teaspoon NT  Oral - Honey Cup --  Oral - Nectar Teaspoon 3  Oral - Nectar Cup 7  Oral - Nectar Straw --  Oral - Thin Teaspoon --  Oral - Thin Cup 1  Oral - Thin Straw --  Oral - Puree 4  Oral - Mech Soft 2  Oral - Regular --  Oral - Multi-Consistency --  Oral - Pill --  Oral Phase - Comment --    CHL IP PHARYNGEAL PHASE 03/22/2021  Pharyngeal Phase Impaired  Pharyngeal- Pudding Teaspoon --  Pharyngeal --  Pharyngeal- Pudding Cup --  Pharyngeal --  Pharyngeal- Honey Teaspoon NT  Pharyngeal --  Pharyngeal- Honey Cup --  Pharyngeal --  Pharyngeal- Nectar Teaspoon 3  Pharyngeal --  Pharyngeal- Nectar Cup 7  Pharyngeal --  Pharyngeal- Nectar Straw --  Pharyngeal --  Pharyngeal- Thin Teaspoon --  Pharyngeal --  Pharyngeal- Thin Cup 1  Pharyngeal --  Pharyngeal- Thin Straw --  Pharyngeal --  Pharyngeal- Puree 4  Pharyngeal --  Pharyngeal- Mechanical Soft 2  Pharyngeal --  Pharyngeal- Regular --  Pharyngeal --  Pharyngeal- Multi-consistency --  Pharyngeal --  Pharyngeal- Pill --  Pharyngeal --  Pharyngeal Comment --     CHL IP CERVICAL ESOPHAGEAL PHASE 03/22/2021  Cervical Esophageal Phase WFL  Pudding Teaspoon --  Pudding Cup --  Honey Teaspoon --  Honey Cup --  Nectar Teaspoon --  Nectar Cup --  Nectar Straw --  Thin Teaspoon --  Thin Cup --  Thin Straw --  Puree --  Mechanical Soft --  Regular --  Multi-consistency --  Pill --  Cervical Esophageal Comment --        [name], MS, CCC-SLP Speech Language Pathologist Rehab Services [phone] [facility] 03/22/2021, 3:28 PM

## 2021-03-23 DIAGNOSIS — E43 Unspecified severe protein-calorie malnutrition: Secondary | ICD-10-CM | POA: Diagnosis not present

## 2021-03-23 DIAGNOSIS — K5792 Diverticulitis of intestine, part unspecified, without perforation or abscess without bleeding: Secondary | ICD-10-CM | POA: Diagnosis not present

## 2021-03-23 LAB — GLUCOSE, CAPILLARY
Glucose-Capillary: 102 mg/dL — ABNORMAL HIGH (ref 70–99)
Glucose-Capillary: 126 mg/dL — ABNORMAL HIGH (ref 70–99)
Glucose-Capillary: 127 mg/dL — ABNORMAL HIGH (ref 70–99)
Glucose-Capillary: 146 mg/dL — ABNORMAL HIGH (ref 70–99)
Glucose-Capillary: 74 mg/dL (ref 70–99)
Glucose-Capillary: 91 mg/dL (ref 70–99)
Glucose-Capillary: 93 mg/dL (ref 70–99)
Glucose-Capillary: 98 mg/dL (ref 70–99)

## 2021-03-23 LAB — BASIC METABOLIC PANEL
Anion gap: 8 (ref 5–15)
BUN: 52 mg/dL — ABNORMAL HIGH (ref 8–23)
CO2: 22 mmol/L (ref 22–32)
Calcium: 8.9 mg/dL (ref 8.9–10.3)
Chloride: 110 mmol/L (ref 98–111)
Creatinine, Ser: 0.81 mg/dL (ref 0.44–1.00)
GFR, Estimated: >60 mL/min (ref >60)
Glucose, Bld: 97 mg/dL (ref 70–99)
Potassium: 3.6 mmol/L (ref 3.5–5.1)
Sodium: 140 mmol/L (ref 135–145)

## 2021-03-23 LAB — MAGNESIUM: Magnesium: 2 mg/dL (ref 1.7–2.4)

## 2021-03-23 LAB — CBC
HCT: 26.7 % — ABNORMAL LOW (ref 36.0–46.0)
Hemoglobin: 8.7 g/dL — ABNORMAL LOW (ref 12.0–15.0)
MCH: 31 pg (ref 26.0–34.0)
MCHC: 32.6 g/dL (ref 30.0–36.0)
MCV: 95 fL (ref 80.0–100.0)
Platelets: 253 10*3/uL (ref 150–400)
RBC: 2.81 MIL/uL — ABNORMAL LOW (ref 3.87–5.11)
RDW: 17 % — ABNORMAL HIGH (ref 11.5–15.5)
WBC: 6.2 10*3/uL (ref 4.0–10.5)
nRBC: 0 % (ref 0.0–0.2)

## 2021-03-23 LAB — PHOSPHORUS: Phosphorus: 3.9 mg/dL (ref 2.5–4.6)

## 2021-03-23 MED ORDER — ADULT MULTIVITAMIN W/MINERALS CH
1.0000 | ORAL_TABLET | Freq: Every day | ORAL | Status: DC
  Administered 2021-03-24 – 2021-04-03 (×11): 1 via ORAL
  Filled 2021-03-23 (×11): qty 1

## 2021-03-23 MED ORDER — ASCORBIC ACID 500 MG PO TABS
500.0000 mg | ORAL_TABLET | Freq: Two times a day (BID) | ORAL | Status: DC
  Administered 2021-03-23 – 2021-04-03 (×22): 500 mg via ORAL
  Filled 2021-03-23 (×22): qty 1

## 2021-03-23 MED ORDER — NEPRO/CARBSTEADY PO LIQD
237.0000 mL | Freq: Three times a day (TID) | ORAL | Status: DC
  Administered 2021-03-23 – 2021-04-03 (×28): 237 mL via ORAL

## 2021-03-23 MED ORDER — AMIODARONE HCL 200 MG PO TABS
200.0000 mg | ORAL_TABLET | Freq: Two times a day (BID) | ORAL | Status: DC
  Administered 2021-03-23 – 2021-04-03 (×22): 200 mg via ORAL
  Filled 2021-03-23 (×22): qty 1

## 2021-03-23 MED ORDER — ALPRAZOLAM 0.25 MG PO TABS
0.2500 mg | ORAL_TABLET | Freq: Two times a day (BID) | ORAL | Status: DC | PRN
  Administered 2021-03-23 – 2021-04-02 (×14): 0.25 mg via ORAL
  Filled 2021-03-23 (×14): qty 1

## 2021-03-23 MED ORDER — ONDANSETRON HCL 4 MG/2ML IJ SOLN: 4.0000 mg | Freq: Four times a day (QID) | INTRAMUSCULAR | Status: DC | PRN

## 2021-03-23 MED ORDER — LEVOTHYROXINE SODIUM 50 MCG PO TABS
75.0000 ug | ORAL_TABLET | Freq: Every day | ORAL | Status: DC
  Administered 2021-03-24 – 2021-04-03 (×11): 75 ug via ORAL
  Filled 2021-03-23 (×11): qty 1

## 2021-03-23 MED ORDER — MIDODRINE HCL 5 MG PO TABS
10.0000 mg | ORAL_TABLET | Freq: Three times a day (TID) | ORAL | Status: DC
  Administered 2021-03-23 – 2021-03-25 (×7): 10 mg via ORAL
  Filled 2021-03-23 (×8): qty 2

## 2021-03-23 MED ORDER — APIXABAN 5 MG PO TABS
5.0000 mg | ORAL_TABLET | Freq: Two times a day (BID) | ORAL | Status: DC
  Administered 2021-03-23 – 2021-04-03 (×23): 5 mg via ORAL
  Filled 2021-03-23 (×24): qty 1

## 2021-03-23 MED ORDER — METOPROLOL SUCCINATE ER 25 MG PO TB24
12.5000 mg | ORAL_TABLET | Freq: Every day | ORAL | Status: DC
  Administered 2021-03-23 – 2021-04-03 (×12): 12.5 mg via ORAL
  Filled 2021-03-23 (×12): qty 1

## 2021-03-23 MED ORDER — POLYETHYLENE GLYCOL 3350 17 G PO PACK
17.0000 g | PACK | Freq: Every day | ORAL | Status: DC
  Administered 2021-03-25 – 2021-04-03 (×8): 17 g via ORAL
  Filled 2021-03-23 (×9): qty 1

## 2021-03-23 MED ORDER — INSULIN ASPART 100 UNIT/ML ~~LOC~~ SOLN: 0.0000 [IU] | Freq: Every day | SUBCUTANEOUS | Status: DC

## 2021-03-23 MED ORDER — INSULIN ASPART 100 UNIT/ML ~~LOC~~ SOLN
0.0000 [IU] | Freq: Three times a day (TID) | SUBCUTANEOUS | Status: DC
  Administered 2021-03-24: 2 [IU] via SUBCUTANEOUS
  Administered 2021-03-25: 17:00:00 1 [IU] via SUBCUTANEOUS
  Administered 2021-03-25: 2 [IU] via SUBCUTANEOUS
  Administered 2021-03-26 – 2021-03-30 (×5): 1 [IU] via SUBCUTANEOUS
  Filled 2021-03-23 (×8): qty 1

## 2021-03-23 MED ORDER — ONDANSETRON HCL 4 MG PO TABS: 4.0000 mg | ORAL_TABLET | Freq: Four times a day (QID) | ORAL | Status: DC | PRN

## 2021-03-23 MED ORDER — DOCUSATE SODIUM 50 MG/5ML PO LIQD
100.0000 mg | Freq: Two times a day (BID) | ORAL | Status: DC
  Administered 2021-03-23 – 2021-03-31 (×15): 100 mg via ORAL
  Filled 2021-03-23 (×23): qty 10

## 2021-03-23 MED ORDER — OXYCODONE HCL 5 MG PO TABS
5.0000 mg | ORAL_TABLET | Freq: Four times a day (QID) | ORAL | Status: DC | PRN
  Administered 2021-03-23 – 2021-04-03 (×20): 5 mg via ORAL
  Filled 2021-03-23 (×20): qty 1

## 2021-03-23 NOTE — Progress Notes (Signed)
Contacted by wound care RN who is coming at 11am to give pain medication before she arrives to do wound care. Documentation on  MAR.

## 2021-03-23 NOTE — Progress Notes (Signed)
Speech Language Pathology Treatment: Dysphagia;Passy Muir Speaking valve  Patient Details Name: Desiree Madden MRN: [id number] DOB: [birth year] Today's Date: 03/23/2021 Time: 6720-9470 SLP Time Calculation (min) (ACUTE ONLY): 60 min  Assessment / Plan / Recommendation Clinical Impression  Pt seen for ongoing assessment of PMV tolerance for verbal communication, and for toleration of newly upgraded oral diet -- dysphagia level 1 w/ Nectar liquids post MBSS on 03/22/2021. Pt has been demonstrating improvement w/ tolerance of wear of PMV this week, and appropriate vocal quality for verbal communication when wearing the PMV. Pt's Alertness is also much improved though she needs cues for engagement during sessions. Note some degree of Cognitive decline in her limited communication responses (w/out detail) and need for cues for follow through w/ tasks. Other Rehab disciplines often find her drowsy during the day. Tracheostomy was placed on 03/08/2021 after ~2 weeks of oral intubation and failing vent wean trials. Pt is now on Trach Collar O2 support, FiO2 28% 5L, weaned from full vent support on 03/15/2021. Pt has NG tube for nutritional needs currently. Pt has a Shiley size 6 tube Cuffed, but Cuff is fully deflated at Baseline.  Pt presented w/ Alertness and engaged w/ this SLP moved to sit upright in bed and given min verbal/tactile cues for follow through. Oral care given w/ appropriate lingual/labial responses. Post determining full Cuff deflation, PMV was placed. O2 sats remained 100%, RR ~21, HR ~89 as at her Baseline. Pt practiced "loud" phonations of "ahh" and counting and months/days w/ approprirate vocal cord contact and vocal quality. Pt then spoke w/ SLP w/ basic responses when asked general questions and noting program on TV. No ANS changes: HR 80s, RR 18-21, O2 sats 99-100%. Trials of po's of Lunch then followed w/ trials of ice chips initially post oral care. Ice chips appear to be pt's favorite --  she asks for them often. Pt then assisted to feed self trials of puree and Nectar liquids Via Cup w/ No overt, clinical s/s of aspiration noted w/ Multiple trials of ice chips(before foods/Nectar), purees, and Nectar liquids Via Cup. Vocal quality remained clear; no immediate coughing; no changes in ANS. Oral phase appeared [facility] for bolus management then transfer for prompt swallows. Only slight, inconsistent increased oral phase time noted w/ purees. Pt most often requested ice chips so returned to this post the Lunch meal. Pt was encouraged to eat the Magic Cup supplement which she fed self.   Recommend frequent oral care for hygiene and stimulation of swallowing. Recommend PRN wear/use of PMV for verbal communication w/ others w/ 100% Supervision and following precautions as posted above bed in room. PMV wear will aid in Pulmonary status improvement and in engagement and communication w/ other to support Cognitive/mental status improvement and awareness. Encouraged Dtr to call and talk to pt via phone having [place] place and monitor the PMV. Recommend Pleasure PRN Single ice chips while wearing the PMV(MUST WEAR PMV FOR ORAL INTAKE) following aspiration precautions.  Recommend continue w/ a Dysphagia level 1 (puree) diet w/ Nectar consistency liquids Via Cup; general aspiration precautions. Support at meals to encourage oral intake, feeding when needed. Reduce distractions at meals. Pt MUST WEAR THE PMV W/ ALL ORAL INTAKE. Recommend Dietician f/u for support. Daughter agreed w/ POC via phone call. NSG/MD updated.    HPI HPI: Pt is a 70 y.o. female who admitted on 02/21/2021 w/ Diverticulitis with Colovesical fistula and UTI, now 12 Days Post-Op s/p Hartman's Procedure for complicated diverticulitis with gangrenous changes  and perforation with feculent peritonitis and 4 days s/p colostomy revision.  Pt remained on vent failing wean; Tracheostomy placed on 03/08/2021.  Pt is now on Trach Collar O2 support weaning  from full vent support on 03/15/2021.  Ongoing Wound Care.  MBSS completed on 03/22/2021.      SLP Plan  Continue with current plan of care       Recommendations  Diet recommendations: Dysphagia 1 (puree);Nectar-thick liquid Liquids provided via: Cup;No straw Medication Administration: Crushed with puree (for safer swallowing) Supervision: Patient able to self feed;Staff to assist with self feeding;Full supervision/cueing for compensatory strategies Compensations: Minimize environmental distractions;Slow rate;Small sips/bites;Lingual sweep for clearance of pocketing;Multiple dry swallows after each bite/sip;Follow solids with liquid (MUST WEAR THE PMV W/ ALL ORAL INTAKE) Postural Changes and/or Swallow Maneuvers: Out of bed for meals;Seated upright 90 degrees;Upright 30-60 min after meal      Patient may use Passy-Muir Speech Valve: Intermittently with supervision;During all therapies with supervision;During all waking hours (remove during sleep);During PO intake/meals PMSV Supervision: Full MD: Please consider changing trach tube to : Cuffless         General recommendations:  (OT/PT ongoing) Oral Care Recommendations: Oral care BID;Oral care before and after PO;Staff/trained caregiver to provide oral care Follow up Recommendations: Skilled Nursing facility (TBD) SLP Visit Diagnosis: Dysphagia, oropharyngeal phase (R13.12);Aphonia (R49.1) (tracheostomy) Plan: Continue with current plan of care       GO                 [name], MS, CCC-SLP Speech Language Pathologist Rehab Services [phone] [facility] 03/23/2021, 3:33 PM

## 2021-03-23 NOTE — Progress Notes (Addendum)
[place] for Electrolyte Monitoring and Replacement   Recent Labs: Potassium (mmol/L)  Date Value  03/23/2021 3.6   Magnesium (mg/dL)  Date Value  03/23/2021 2.0   Calcium (mg/dL)  Date Value  03/23/2021 8.9   Albumin (g/dL)  Date Value  03/16/2021 2.4 (L)   Phosphorus (mg/dL)  Date Value  03/23/2021 3.9   Sodium (mmol/L)  Date Value  03/23/2021 140   Assessment: Desiree Madden is a 70 y.o. female admitted on 02/21/2021 with intra-abdominal infection (diverticulitis w/ fecal perforation). Patient septic with acute diverticulitis abdominal abscess with colovesical fistula and enterococcus urinary tract infection which was further complicated by the development of complicated diverticulitis with perforation and gangrenous changes requiring Hartman's procedure performed on 03/01/21. PMH includes CAD s/p CABG (2015), HTN, hypothyroidism, colon bladder fistula, diverticulitis, and ulcerative colitis. Pharmacy was consulted for electrolyte monitoring.  Nutrition: Tube feeds stopped. SLP has cleared patient for dysphagia 1 diet  Goal of Therapy:  K 4.0 - 5.1 mmol/L Mg 2.0 - 2.4 mg/dL All Other Electrolytes WNL  Plan:   No electrolyte replacement warranted today  Sodium stable, BUN:SCr ratio elevated at 64  Re-check electrolytes with AM labs  Benita Gutter 03/23/2021 7:53 AM

## 2021-03-23 NOTE — Progress Notes (Signed)
Physical Therapy Treatment Patient Details Name: Desiree Madden MRN: [id number] DOB: Aug 18, 1951 Today's Date: 03/23/2021    History of Present Illness Angelli Baruch is a 70 y.o. female with medical history significant for colonic bladder fistula, diverticulitis, hypertension, hypothyroidism, hyperlipidemia, ulcerative colitis, who presented to the hospital on 02/21/2021 because of abdominal pain.  She was admitted to the hospital for acute diverticulitis.  Hospital course was complicated by perforated diverticulitis and she underwent emergent sigmoid colectomy with end colostomy and Hartman's procedure on 03/01/2021.  She was transferred to the ICU postoperatively.  She developed septic shock requiring multiple vasopressors.  She remained intubated on mechanical ventilation postoperatively.  She could not be liberated from the ventilator.  She failed extubation on 03/06/2021 and was reintubated.  ENT was consulted and tracheostomy was performed on 03/08/2021.   Ultimately extubated 4/8.    PT Comments    Patient sleeping on arrival, able to wake to voice.  Patient requires cues initially to open eyes. She requires total assist for bed mobility and to stand with +2 assist. Patient demonstrates improved sitting balance once positioned at edge of bed. Continues to be lethargic throughout session. Difficulty following direction. Decreased motivation. Patient will continue to benefit from skilled PT while here to improve mobility, strength.     Follow Up Recommendations  SNF;Supervision/Assistance - 24 hour     Equipment Recommendations  None recommended by PT    Recommendations for Other Services       Precautions / Restrictions Precautions Precautions: Fall Restrictions Weight Bearing Restrictions: No    Mobility  Bed Mobility Overal bed mobility: Needs Assistance Bed Mobility: Supine to Sit;Sit to Supine;Rolling Rolling: Max assist;+2 for physical assistance   Supine to sit: Max  assist;+2 for physical assistance Sit to supine: Max assist;+2 for physical assistance   General bed mobility comments: Patient requires hand over hand assist to grab rail. No real assistance provided by patient for mobility in bed.    Transfers Overall transfer level: Needs assistance Equipment used: Rolling walker (2 wheeled) Transfers: Sit to/from Stand Sit to Stand: Total assist         General transfer comment: Patient able to come to standing with max assist +2. Unable to attain standing balance without a lot of assistance. Posterior lean  Ambulation/Gait             General Gait Details: unable/unsafe   Stairs             Wheelchair Mobility    Modified Rankin (Stroke Patients Only)       Balance Overall balance assessment: Needs assistance Sitting-balance support: Feet supported Sitting balance-Leahy Scale: Fair Sitting balance - Comments: Patient able to sit independently with supervision once positioned at edge of bed.   Standing balance support: Bilateral upper extremity supported;During functional activity Standing balance-Leahy Scale: Zero                              Cognition Arousal/Alertness: Awake/alert Behavior During Therapy: Flat affect Overall Cognitive Status: Impaired/Different from baseline Area of Impairment: Orientation;Following commands;Safety/judgement;Problem solving;Awareness                 Orientation Level: Disoriented to;Time;Situation     Following Commands: Follows one step commands with increased time;Follows one step commands inconsistently Safety/Judgement: Decreased awareness of deficits;Decreased awareness of safety Awareness: Intellectual Problem Solving: Difficulty sequencing;Requires verbal cues;Requires tactile cues General Comments: Patient has difficulty with attention to task. Requires mulitmodal  cues to follow through. Limited initiation for tasks.      Exercises Total Joint  Exercises Ankle Circles/Pumps: AROM;Right;5 reps Heel Slides: AAROM;Both;5 reps Hip ABduction/ADduction: AAROM;Both;5 reps    General Comments        Pertinent Vitals/Pain Pain Assessment: No/denies pain    Home Living                      Prior Function            PT Goals (current goals can now be found in the care plan section) Acute Rehab PT Goals Patient Stated Goal: none stated PT Goal Formulation: Patient unable to participate in goal setting Time For Goal Achievement: 04/11/21 Progress towards PT goals: Progressing toward goals    Frequency    Min 2X/week      PT Plan Current plan remains appropriate    Co-evaluation              AM-PAC PT "6 Clicks" Mobility   Outcome Measure  Help needed turning from your back to your side while in a flat bed without using bedrails?: Total Help needed moving from lying on your back to sitting on the side of a flat bed without using bedrails?: Total Help needed moving to and from a bed to a chair (including a wheelchair)?: Total Help needed standing up from a chair using your arms (e.g., wheelchair or bedside chair)?: Total Help needed to walk in hospital room?: Total Help needed climbing 3-5 steps with a railing? : Total 6 Click Score: 6    End of Session Equipment Utilized During Treatment: Gait belt Activity Tolerance: Patient limited by lethargy;Other (comment) (cognition) Patient left: with call bell/phone within reach;with nursing/sitter in room;with SCD's reapplied Nurse Communication: Mobility status PT Visit Diagnosis: Muscle weakness (generalized) (M62.81);Unsteadiness on feet (R26.81);Other abnormalities of gait and mobility (R26.89)     Time: 9150-5697 PT Time Calculation (min) (ACUTE ONLY): 21 min  Charges:  $Therapeutic Activity: 8-22 mins                     Jalen Oberry, PT, GCS 03/23/21,11:17 AM

## 2021-03-23 NOTE — Progress Notes (Signed)
Occupational Therapy Treatment Patient Details Name: Desiree Madden MRN: [id number] DOB: 1951-04-21 Today's Date: 03/23/2021    History of present illness Desiree Madden is a 70 y.o. female with medical history significant for colonic bladder fistula, diverticulitis, hypertension, hypothyroidism, hyperlipidemia, ulcerative colitis, who presented to the hospital on 02/21/2021 because of abdominal pain.  She was admitted to the hospital for acute diverticulitis.  Hospital course was complicated by perforated diverticulitis and she underwent emergent sigmoid colectomy with end colostomy and Hartman's procedure on 03/01/2021.  She was transferred to the ICU postoperatively.  She developed septic shock requiring multiple vasopressors.  She remained intubated on mechanical ventilation postoperatively.  She could not be liberated from the ventilator.  She failed extubation on 03/06/2021 and was reintubated.  ENT was consulted and tracheostomy was performed on 03/08/2021.   Ultimately extubated 4/8.   OT comments  Pt seen for OT co-tx with PT this date to f/u re: strengthening as it pertains to ADLs and ADL mobility. Pt requires MAX +2 for bed mobility and STS trial. Pt only tolerates static stand x10 secs and requires increased encouragement to engage in task participation. PT/OT alternate engaging pt in functional strengthening exercises while seated EOB to improve pt tolerance for OOB tasks. UB therex completed includes: alternating straight arm raises and FWD punches to simulate reaching to obtain ADL items and improve dynamic sitting balance as well. Pt requires MOD visual/verbal cues for form/pace/[place]. Will continue to follow. Continue to anticipate that pt will require f/u OT services in SNF setting to reach maximum potential functionally.    Follow Up Recommendations  SNF    Equipment Recommendations  Other (comment) (defer)    Recommendations for Other Services      Precautions /  Restrictions Precautions Precautions: Fall Restrictions Weight Bearing Restrictions: No       Mobility Bed Mobility Overal bed mobility: Needs Assistance Bed Mobility: Supine to Sit;Sit to Supine;Rolling Rolling: Max assist;+2 for physical assistance Sidelying to sit: Max assist Supine to sit: Max assist;+2 for physical assistance Sit to supine: Max assist;+2 for physical assistance Sit to sidelying: Max assist General bed mobility comments: Patient requires hand over hand assist to grab rail. No real assistance provided by patient for mobility in bed.    Transfers Overall transfer level: Needs assistance Equipment used: Rolling walker (2 wheeled) Transfers: Sit to/from Stand Sit to Stand: Total assist         General transfer comment: Patient able to come to standing with max assist +2. Unable to attain standing balance without a lot of assistance. Posterior lean. Impacted by L foot drop as well (podus boot ordered)    Balance Overall balance assessment: Needs assistance Sitting-balance support: Feet supported Sitting balance-Leahy Scale: Fair Sitting balance - Comments: Patient able to sit independently with supervision once positioned at edge of bed. (L lateral lean initially and requires significant hip positioninig effort to square up)   Standing balance support: Bilateral upper extremity supported;During functional activity Standing balance-Leahy Scale: Zero Standing balance comment: 2p MAX A to minimally sustain balance in static stand                           ADL either performed or assessed with clinical judgement   ADL  Vision Patient Visual Report: No change from baseline Additional Comments: difficult to assess, min/mod tracking/visual attn during session   Perception     Praxis      Cognition Arousal/Alertness: Awake/alert Behavior During Therapy: Flat affect Overall  Cognitive Status: Impaired/Different from baseline Area of Impairment: Orientation;Following commands;Safety/judgement;Problem solving;Awareness                 Orientation Level: Disoriented to;Time;Situation     Following Commands: Follows one step commands with increased time;Follows one step commands inconsistently Safety/Judgement: Decreased awareness of deficits;Decreased awareness of safety Awareness: Intellectual Problem Solving: Difficulty sequencing;Requires verbal cues;Requires tactile cues General Comments: Patient has difficulty with attention to task. Requires mulitmodal cues to follow through. Limited initiation for tasks.        Exercises Other Exercises Other Exercises: SAR x10 b/l for 2 sets, FWD punch x10 alternating for 2 sets with MAX cues for sequence/pace   Shoulder Instructions       General Comments      Pertinent Vitals/ Pain       Pain Assessment: No/denies pain  Home Living                                          Prior Functioning/Environment              Frequency  Min 1X/week        Progress Toward Goals  OT Goals(current goals can now be found in the care plan section)  Progress towards OT goals: Progressing toward goals  Acute Rehab OT Goals Patient Stated Goal: none stated OT Goal Formulation: Patient unable to participate in goal setting Time For Goal Achievement: 04/04/21 Potential to Achieve Goals: [place] Discharge plan remains appropriate    Co-evaluation    PT/OT/SLP Co-Evaluation/Treatment: Yes Reason for Co-Treatment: Complexity of the patient's impairments (multi-system involvement) PT goals addressed during session: Mobility/safety with mobility OT goals addressed during session: ADL's and self-care      AM-PAC OT "6 Clicks" Daily Activity     Outcome Measure   Help from another person eating meals?: A Little Help from another person taking care of personal grooming?: A  Lot Help from another person toileting, which includes using toliet, bedpan, or urinal?: A Lot Help from another person bathing (including washing, rinsing, drying)?: A Lot Help from another person to put on and taking off regular upper body clothing?: A Lot Help from another person to put on and taking off regular lower body clothing?: A Lot 6 Click Score: 13    End of Session Equipment Utilized During Treatment: Oxygen (trach)  OT Visit Diagnosis: Unsteadiness on feet (R26.81);Muscle weakness (generalized) (M62.81);Other symptoms and signs involving cognitive function   Activity Tolerance Patient tolerated treatment well;Patient limited by fatigue   Patient Left in bed;with call bell/phone within reach;with bed alarm set   Nurse Communication          Time: 1037-1100 OT Time Calculation (min): 23 min  Charges: OT General Charges $OT Visit: 1 Visit OT Treatments $Therapeutic Exercise: 8-22 mins  Gerrianne Scale, [place], OTR/L ascom [phone] 03/23/21, 5:52 PM

## 2021-03-23 NOTE — Progress Notes (Signed)
Nutrition Follow-up  DOCUMENTATION CODES:   Severe malnutrition in context of acute illness/injury  INTERVENTION:   Nepro Shake po TID, each supplement provides 425 kcal and 19 grams protein  Magic cup TID with meals, each supplement provides 290 kcal and 9 grams of protein  MVI po daily   Vitamin C 561m po BID   NUTRITION DIAGNOSIS:   Severe Malnutrition related to acute illness as evidenced by moderate fat depletion,moderate muscle depletion,severe muscle depletion. -ongoing   GOAL:   Patient will meet greater than or equal to 90% of their needs  -previously met with tube feeds   MONITOR:   PO intake,Supplement acceptance,Labs,Weight trends,Skin,I & O's  ASSESSMENT:   70y.o. female with h/o HTN, CKD III, colovesical fistula, hypothroidism and HLD who is admitted with diverticulitis and constipation.   -Pt s/p sigmoid colectomy with end colostomy, Hartman's procedure, splenic flexure mobilization and fistula repair 3/24 -Pt s/p tracheostomy and NGT 3/31 -Pt s/p ostomy revision and maturation 4/1  NGT removed 4/14. Pt s/p MBSS and initiated on a dysphagia 1/nectar thick diet. RD will add supplements to help pt meet her estimated needs and to support wound healing. If patient is unable to meet her estimated needs via oral intake, recommend G-tube placement as pt with increased estimated needs secondary to severe malnutrition and wounds. Per chart, pt is up ~9lbs since admit.   Medications reviewed and include: colace, insulin, synthroid, MVI, protonix, miralax  Labs reviewed: Na 140 wnl, K 3.6 wnl, BUN 65(H), P 3.9 wnl, Mg 2.0 wnl Hgb 8.7(L), Hct 26.7(L) cbgs- 93, 98, 91, 146 x 24 hrs  Diet Order:    Diet Order            DIET - DYS 1 Room service appropriate? Yes with Assist; Fluid consistency: Nectar Thick  Diet effective now                EDUCATION NEEDS:   Not appropriate for education at this time  Skin:  Incisions: Midline wound 15X8X7cm with  VAC  Last BM:  4/14- type 6- 541mvia ostomy  Height:   Ht Readings from Last 1 Encounters:  03/14/21 5' 4.02" (1.626 m)    Weight:   Wt Readings from Last 1 Encounters:  03/23/21 63.1 kg    Ideal Body Weight:  54.5 kg  BMI:  Body mass index is 23.86 kg/m.  Estimated Nutritional Needs:   Kcal:  1700-1900kcal/day  Protein:  100-115g/day  Fluid:  1.4-1.7L/day  CaKoleen DistanceS, RD, LDN Please refer to [facility] LLCor RD and/or RD on-call/weekend/after hours pager

## 2021-03-23 NOTE — Consult Note (Addendum)
[place] Nurse wound follow up Vac dressing change performed. Wound type:Abd with full thickness post-op wound to midline;evolving to 2 separate wounds to upper and lower abd with narrow bridge of skin in between. Beefyred, small amtpinkdrainage,no odor Ptwas medicated for pain prior to the procedure and tolerated with minimal amt discomfort.Applied barrier ring to creases surrounding wound at 3:00 o'clockand 9:00 o'clockandalso5:00 o'clock to 7:00 o'clock, then one piece black foamto 151mm cont suction. Wound is located in close proximity to the ostomy andmaybe difficult to maintain a seal. WOC team will change dressing Q M/W/F.  WOC Nurse ostomy follow up Stoma type/location:LMQ colostomy Stomal assessment/size:1 and 1/2 inches round, red, raised above skin level. There is a crease at 9:00 o'clock to the stoma and another deep crease below stoma; applied barrier ring to attempt to maintain a seal. Output:mod amt liquid brown stool Ostomy pouching: Applied 2pc.2 and 3/4 inch pouching system with skin barrier ring Nils Pyle # [phone], wafer #2, pouch # [id number]) 3 sets of supplies at the bedside for staff nurse use. Education provided:No family present; progress notes indicate pt will discharge to SNF and have total assistance with pouching activities.  Enrolled patient in [place] [facility] Discharge program: Not yet Julien Girt MSN, RN, CWOCN, [employer], CNS

## 2021-03-23 NOTE — Progress Notes (Signed)
Progress Note    Desiree Madden  CMK:[id number] DOB: 08/02/1951  DOA: 02/21/2021 PCP: [place]      Brief Narrative:    Medical records reviewed and are as summarized below:  Desiree Madden is a 70 y.o. female with medical history significant for colonic bladder fistula, diverticulitis, hypertension, hypothyroidism, hyperlipidemia, ulcerative colitis, who presented to the hospital on 02/21/2021 because of abdominal pain.  She was admitted to the hospital for acute diverticulitis.  Hospital course was complicated by perforated diverticulitis and she underwent emergent sigmoid colectomy with end colostomy and Hartman's procedure on 03/01/2021.  She was transferred to the ICU postoperatively.  She developed septic shock requiring multiple vasopressors.  She remained intubated on mechanical ventilation postoperatively.  She could not be liberated from the ventilator.  She failed extubation on 03/06/2021 and was reintubated.  ENT was consulted and tracheostomy was performed on 03/08/2021.      Assessment/Plan:   Principal Problem:   Diverticulitis Active Problems:   Fistula, bladder   Ulcerative colitis ([place])   Hyperlipidemia   Essential hypertension   Hypothyroidism   Abnormal ECG   CKD (chronic kidney disease), stage III (HCC)   Elevated lactic acid level   Protein-calorie malnutrition, severe   Nutrition Problem: Severe Malnutrition Etiology: acute illness  Signs/Symptoms: moderate fat depletion,moderate muscle depletion,severe muscle depletion   Body mass index is 23.86 kg/m.     Septic shock, hemorrhagic shock secondary to complicated diverticulitis with perforation, pelvic abscess, gangrene of the sigmoid colon: Shock physiology resolved.  S/p sigmoid colectomy with end colostomy, Hartman's procedure on 03/01/2021.  Completed antibiotics on 03/15/2021.  She has a wound VAC in place.  Continue local wound care.  Follow-up with general surgeon.      Dysphagia, Severe malnutrition: She is tolerating dysphagia 1 diet and nectar thick liquids. Follow-up with speech therapist for further recommendations.  Acute on chronic hypoxic respiratory failure: Failure to wean from ventilator requiring tracheostomy.  Oxygen has been tapered down from 5 L/min to 3 L/min.  FiO2 of 28%.  SVT, sinus tachycardia, atrial fibrillation: Continue amiodarone.  Discussed long-term anticoagulation with patient and her daughter, [name].  Risks and benefits were discussed.  They are both agreeable to use Eliquis for stroke prophylaxis.  NSTEMI, ischemic cardiomyopathy, chronic systolic and diastolic CHF: Start Toprol-XL and monitor BP closely.  This may be discontinued if hypotension becomes an issue.  2D echo showed EF estimated at 20 to 25% and grade 1 diastolic dysfunction.  Acute urinary retention: Urologist recommended keeping Foley catheter for 1 to 2 weeks from 03/16/2021.  Cystogram is recommended prior to Foley catheter removal.  Encouraged [name], her daughter, to discuss SNF options with the case manager for discharge planning.      Diet Order            DIET - DYS 1 Room service appropriate? Yes with Assist; Fluid consistency: Nectar Thick  Diet effective now                    Consultants:  General surgeon  [place]  ENT surgeon  Urologist  Procedures:  Tracheostomy on 03/08/2021  Sigmoid colectomy with colostomy and Hartman's procedure  Cystorrhaphy by urologist on 03/02/2019 for colovesical fistula and diverticulitis/pelvic abscess    Medications:   . amiodarone  200 mg Oral BID  . apixaban  5 mg Oral BID  . vitamin C  500 mg Oral BID  . chlorhexidine gluconate (MEDLINE KIT)  15 mL  Mouth Rinse BID  . Chlorhexidine Gluconate Cloth  6 each Topical Daily  . docusate  100 mg Oral BID  . feeding supplement (NEPRO CARB STEADY)  237 mL Oral TID BM  . insulin aspart  0-20 Units Subcutaneous Q4H  . [START ON 03/24/2021]  levothyroxine  75 mcg Oral Q0600  . mouth rinse  15 mL Mouth Rinse 10 times per day  . metoprolol succinate  12.5 mg Oral Daily  . midodrine  10 mg Oral TID WC  . [START ON 03/24/2021] multivitamin with minerals  1 tablet Oral Daily  . pantoprazole  40 mg Oral Daily  . [START ON 03/24/2021] polyethylene glycol  17 g Oral Daily  . sodium chloride flush  10-40 mL Intracatheter Q12H  . sodium chloride flush  5 mL Intracatheter Q8H   Continuous Infusions: . sodium chloride Stopped (03/20/21 1026)     Anti-infectives (From admission, onward)   Start     Dose/Rate Route Frequency Ordered Stop   03/13/21 1400  meropenem (MERREM) 1 g in sodium chloride 0.9 % 100 mL IVPB        1 g 200 mL/hr over 30 Minutes Intravenous Every 8 hours 03/13/21 0855 03/16/21 2203   03/12/21 1800  meropenem (MERREM) 1 g in sodium chloride 0.9 % 100 mL IVPB  Status:  Discontinued        1 g 200 mL/hr over 30 Minutes Intravenous Every 12 hours 03/12/21 0840 03/13/21 0855   03/10/21 1000  meropenem (MERREM) 1 g in sodium chloride 0.9 % 100 mL IVPB  Status:  Discontinued        1 g 200 mL/hr over 30 Minutes Intravenous Every 8 hours 03/10/21 0854 03/12/21 0840   03/09/21 0600  fluconazole (DIFLUCAN) IVPB 200 mg  Status:  Discontinued        200 mg 100 mL/hr over 60 Minutes Intravenous Every 24 hours 03/08/21 0751 03/14/21 1014   03/08/21 2200  meropenem (MERREM) 1 g in sodium chloride 0.9 % 100 mL IVPB  Status:  Discontinued        1 g 200 mL/hr over 30 Minutes Intravenous Every 12 hours 03/08/21 0748 03/10/21 0854   03/08/21 0600  fluconazole (DIFLUCAN) IVPB 400 mg  Status:  Discontinued        400 mg 100 mL/hr over 120 Minutes Intravenous Every 24 hours 03/07/21 1321 03/08/21 0839   03/07/21 1100  meropenem (MERREM) 1 g in sodium chloride 0.9 % 100 mL IVPB  Status:  Discontinued        1 g 200 mL/hr over 30 Minutes Intravenous Every 8 hours 03/07/21 1013 03/08/21 0748   03/07/21 1000  meropenem (MERREM) 1 g in  sodium chloride 0.9 % 100 mL IVPB  Status:  Discontinued        1 g 200 mL/hr over 30 Minutes Intravenous Every 12 hours 03/06/21 2243 03/07/21 1013   03/07/21 0600  fluconazole (DIFLUCAN) IVPB 400 mg        400 mg 100 mL/hr over 120 Minutes Intravenous  Once 03/06/21 2243 03/07/21 0701   03/04/21 1800  vancomycin (VANCOREADY) IVPB 750 mg/150 mL        750 mg 150 mL/hr over 60 Minutes Intravenous Every 24 hours 03/03/21 1442 03/06/21 2040   03/03/21 0800  vancomycin (VANCOREADY) IVPB 1500 mg/300 mL  Status:  Discontinued        1,500 mg 150 mL/hr over 120 Minutes Intravenous Every 48 hours 03/02/21 1410 03/03/21 1442   03/02/21  0800  vancomycin (VANCOREADY) IVPB 750 mg/150 mL  Status:  Discontinued        750 mg 150 mL/hr over 60 Minutes Intravenous Every 24 hours 03/01/21 1954 03/02/21 1409   03/01/21 2200  ceFEPIme (MAXIPIME) 2 g in sodium chloride 0.9 % 100 mL IVPB        2 g 200 mL/hr over 30 Minutes Intravenous Every 12 hours 03/01/21 1903 03/06/21 2203   03/01/21 2100  vancomycin (VANCOREADY) IVPB 1250 mg/250 mL        1,250 mg 166.7 mL/hr over 90 Minutes Intravenous  Once 03/01/21 1954 03/02/21 0008   03/01/21 2000  metroNIDAZOLE (FLAGYL) IVPB 500 mg        500 mg 100 mL/hr over 60 Minutes Intravenous Every 8 hours 03/01/21 1903 03/06/21 2002   02/28/21 1800  piperacillin-tazobactam (ZOSYN) IVPB 3.375 g  Status:  Discontinued        3.375 g 12.5 mL/hr over 240 Minutes Intravenous Every 8 hours 02/28/21 1643 03/01/21 1903   02/28/21 1730  piperacillin-tazobactam (ZOSYN) IVPB 3.375 g  Status:  Discontinued        3.375 g 100 mL/hr over 30 Minutes Intravenous Every 8 hours 02/28/21 1635 02/28/21 1642   02/28/21 1300  Ampicillin-Sulbactam (UNASYN) 3 g in sodium chloride 0.9 % 100 mL IVPB  Status:  Discontinued        3 g 200 mL/hr over 30 Minutes Intravenous Every 6 hours 02/28/21 1156 02/28/21 1635   02/23/21 1230  piperacillin-tazobactam (ZOSYN) IVPB 3.375 g  Status:   Discontinued        3.375 g 12.5 mL/hr over 240 Minutes Intravenous Every 8 hours 02/23/21 1143 02/28/21 1155   02/22/21 0600  ciprofloxacin (CIPRO) IVPB 400 mg  Status:  Discontinued        400 mg 200 mL/hr over 60 Minutes Intravenous Every 12 hours 02/21/21 2028 02/23/21 1143   02/22/21 0400  metroNIDAZOLE (FLAGYL) IVPB 500 mg  Status:  Discontinued        500 mg 100 mL/hr over 60 Minutes Intravenous Every 8 hours 02/21/21 2028 02/23/21 1143   02/21/21 1830  ciprofloxacin (CIPRO) IVPB 400 mg        400 mg 200 mL/hr over 60 Minutes Intravenous  Once 02/21/21 1820 02/21/21 1939   02/21/21 1830  metroNIDAZOLE (FLAGYL) IVPB 500 mg        500 mg 100 mL/hr over 60 Minutes Intravenous  Once 02/21/21 1820 02/21/21 2110             Family Communication/Anticipated D/C date and plan/Code Status   DVT prophylaxis: SCDs Start: 02/21/21 2200     Code Status: DNR  Family Communication: Discussed with her daughter, [name], over the phone Disposition Plan:    Status is: Inpatient  Remains inpatient appropriate because:Inpatient level of care appropriate due to severity of illness   Dispo: The patient is from: Home              Anticipated d/c is to: SNF              Patient currently is not medically stable to d/c.   Difficult to place patient No           Subjective:   Interval events noted.  She has no complaints.  She says she feels better today.  Objective:    Vitals:   03/23/21 1200 03/23/21 1319 03/23/21 1503 03/23/21 1512  BP: (!) 97/59  127/86 (!) 148/86  Pulse: 82  90 94  Resp: 12  (!) 27 19  Temp: 98 F (36.7 C)  98.1 F (36.7 C) 98 F (36.7 C)  TempSrc:   Oral Oral  SpO2: 99% 100% 100% 100%  Weight:      Height:       No data found.   Intake/Output Summary (Last 24 hours) at 03/23/2021 1533 Last data filed at 03/23/2021 1134 Gross per 24 hour  Intake 170 ml  Output 2725 ml  Net -2555 ml   Filed Weights   03/01/21 1511 03/22/21 0500  03/23/21 0539  Weight: 67.2 kg 57.7 kg 63.1 kg    Exam:  GEN: NAD SKIN: No rash  EYES: EOMI  ENT: MMM, + tracheostomy CV: RRR PULM: CTA B ABD: soft, ND, NT, +BS, + colostomy, + wound VAC CNS: AAO x 2 (person and place), non focal EXT: No edema or tenderness GU: Foley catheter draining amber urine         Data Reviewed:   I have personally reviewed following labs and imaging studies:  Labs: Labs show the following:   Basic Metabolic Panel: Recent Labs  Lab 03/19/21 0525 03/20/21 0530 03/21/21 0318 03/22/21 0616 03/23/21 0440  NA 142 139 139 141 140  K 4.4 4.1 3.7 4.8 3.6  CL 114* 110 110 111 110  CO2 22 20* 22 23 22   GLUCOSE 139* 146* 131* 136* 97  BUN 59* 65* 60* 59* 52*  CREATININE 0.87 0.79 0.88 0.90 0.81  CALCIUM 8.5* 8.4* 8.6* 8.6* 8.9  MG 2.2 1.8 1.7 2.3 2.0  PHOS 3.3 3.9 3.7 3.6 3.9   GFR Estimated Creatinine Clearance: 55.8 mL/min (by C-G formula based on SCr of 0.81 mg/dL). Liver Function Tests: No results for input(s): AST, ALT, ALKPHOS, BILITOT, PROT, ALBUMIN in the last 168 hours. No results for input(s): LIPASE, AMYLASE in the last 168 hours. No results for input(s): AMMONIA in the last 168 hours. Coagulation profile No results for input(s): INR, PROTIME in the last 168 hours.  CBC: Recent Labs  Lab 03/17/21 0500 03/18/21 0627 03/19/21 0525 03/20/21 0530 03/21/21 0318 03/22/21 0616 03/23/21 0440  WBC 8.4 8.5 8.0 7.5 8.3 7.5 6.2  NEUTROABS 6.3 6.6  --   --   --   --   --   HGB 10.0* 9.8* 9.7* 8.8* 8.8* 9.6* 8.7*  HCT 30.7* 30.4* 30.4* 27.4* 27.3* 29.6* 26.7*  MCV 94.8 95.3 95.3 96.1 94.8 95.8 95.0  PLT 204 192 186 174 177 204 253   Cardiac Enzymes: No results for input(s): CKTOTAL, CKMB, CKMBINDEX, TROPONINI in the last 168 hours. BNP (last 3 results) No results for input(s): PROBNP in the last 8760 hours. CBG: Recent Labs  Lab 03/22/21 2021 03/23/21 0008 03/23/21 0417 03/23/21 0741 03/23/21 1133  GLUCAP 125* 93 98 91  146*   D-Dimer: No results for input(s): DDIMER in the last 72 hours. Hgb A1c: No results for input(s): HGBA1C in the last 72 hours. Lipid Profile: No results for input(s): CHOL, HDL, LDLCALC, TRIG, CHOLHDL, LDLDIRECT in the last 72 hours. Thyroid function studies: No results for input(s): TSH, T4TOTAL, T3FREE, THYROIDAB in the last 72 hours.  Invalid input(s): FREET3 Anemia work up: No results for input(s): VITAMINB12, FOLATE, FERRITIN, TIBC, IRON, RETICCTPCT in the last 72 hours. Sepsis Labs: Recent Labs  Lab 03/20/21 0530 03/21/21 0318 03/22/21 0616 03/23/21 0440  WBC 7.5 8.3 7.5 6.2    Microbiology No results found for this or any previous visit (from the past 240 hour(s)).  Procedures and diagnostic studies:  No results found.             LOS: 30 days   [place] Copywriter, advertising on [email or web address]. If 7PM-7AM, please contact night-coverage at [email or web address]     03/23/2021, 3:33 PM

## 2021-03-23 NOTE — Progress Notes (Signed)
   03/23/21 1200  Assess: MEWS Score  Temp 98 F (36.7 C)  BP (!) 97/59  Pulse Rate 82  ECG Heart Rate 81  Resp 12  SpO2 99 %  Assess: MEWS Score  MEWS Temp 0  MEWS Systolic 1  MEWS Pulse 0  MEWS RR 1  MEWS LOC 0  MEWS Score 2  MEWS Score Color Yellow  Assess: if the MEWS score is Yellow or Red  Were vital signs taken at a resting state? Yes  Focused Assessment Change from prior assessment (see assessment flowsheet)  Early Detection of Sepsis Score *See Row Information* Low  MEWS guidelines implemented *See Row Information* Yes  Treat  MEWS Interventions Administered prn meds/treatments  Pain Scale 0-10  Pain Score 0  Take Vital Signs  Increase Vital Sign Frequency  Yellow: Q 2hr X 2 then Q 4hr X 2, if remains yellow, continue Q 4hrs  Escalate  MEWS: Escalate Yellow: discuss with charge nurse/RN and consider discussing with provider and RRT  Notify: Charge Nurse/RN  Name of Charge Nurse/RN Notified Mammie Russian  Date Charge Nurse/RN Notified 03/23/21  Time Charge Nurse/RN Notified 1205  Document  Patient Outcome Stabilized after interventions;Other (Comment) (PRN given)  Progress note created (see row info) Yes

## 2021-03-23 NOTE — Care Management Important Message (Signed)
Important Message  Patient Details  Name: Desiree Madden MRN: [id number] Date of Birth: December 26, 1950   Medicare Important Message Given:  Yes     Dannette Barbara 03/23/2021, 12:30 PM

## 2021-03-24 DIAGNOSIS — E43 Unspecified severe protein-calorie malnutrition: Secondary | ICD-10-CM | POA: Diagnosis not present

## 2021-03-24 DIAGNOSIS — K5792 Diverticulitis of intestine, part unspecified, without perforation or abscess without bleeding: Secondary | ICD-10-CM | POA: Diagnosis not present

## 2021-03-24 LAB — GLUCOSE, CAPILLARY
Glucose-Capillary: 85 mg/dL (ref 70–99)
Glucose-Capillary: 100 mg/dL — ABNORMAL HIGH (ref 70–99)
Glucose-Capillary: 174 mg/dL — ABNORMAL HIGH (ref 70–99)
Glucose-Capillary: 111 mg/dL — ABNORMAL HIGH (ref 70–99)
Glucose-Capillary: 141 mg/dL — ABNORMAL HIGH (ref 70–99)

## 2021-03-24 LAB — BASIC METABOLIC PANEL
Anion gap: 8 (ref 5–15)
BUN: 46 mg/dL — ABNORMAL HIGH (ref 8–23)
CO2: 24 mmol/L (ref 22–32)
Calcium: 8.8 mg/dL — ABNORMAL LOW (ref 8.9–10.3)
Chloride: 110 mmol/L (ref 98–111)
Creatinine, Ser: 0.97 mg/dL (ref 0.44–1.00)
GFR, Estimated: >60 mL/min (ref >60)
Glucose, Bld: 87 mg/dL (ref 70–99)
Potassium: 3.6 mmol/L (ref 3.5–5.1)
Sodium: 142 mmol/L (ref 135–145)

## 2021-03-24 LAB — PHOSPHORUS: Phosphorus: 5 mg/dL — ABNORMAL HIGH (ref 2.5–4.6)

## 2021-03-24 LAB — MAGNESIUM: Magnesium: 2.1 mg/dL (ref 1.7–2.4)

## 2021-03-24 NOTE — Progress Notes (Signed)
Took over pt care from [place], [place] at [phone]. No changes from prior assessment. Pt complaining of 8/10 back pain. Administered 0.5mg  PRN Dilaudid. Will continue to monitor.

## 2021-03-24 NOTE — Progress Notes (Signed)
Speech Language Pathology Treatment: Nada Boozer Speaking valve  Patient Details Name: Desiree Madden MRN: [id number] DOB: [birth year] Today's Date: 03/24/2021 Time: 7510-2585 SLP Time Calculation (min) (ACUTE ONLY): 45 min  Assessment / Plan / Recommendation Clinical Impression  Pt seen forongoingassessment of PMV tolerance for verbal communication, and for toleration of newly upgraded oral diet -- dysphagia level 1 w/ Nectar liquids post MBSS on 03/22/2021. Pt has been demonstrating improvement w/ tolerance of wear of PMV this week, and appropriate vocal quality for verbal communication when wearing the PMV. Pt's Alertness is also much improved though she needs cues for engagement during sessions. Note some degree of Cognitive decline in her limited communication responses (w/out detail) and need for Mod cues for follow through w/ tasks. Other Rehab disciplines often find her drowsy during the day. Tracheostomywasplaced on 03/08/2021 after ~2 weeks of oral intubation and failing vent wean trials. Pt is now on Trach Collar O2 support, FiO2 28% 5L, weanedfrom full vent support on 03/15/2021. Pt has NG tube for nutritional needs currently. Pt has a Shiley size 6 tube Cuffed,but Cuff is fully deflated at Baseline.  Pt presented w/Alertness and engaged w/ this SLP moved to sit upright in bed and given minverbal/tactile cues for follow through. Oral care given w/ appropriate lingual/labial responses. Post determining full Cuff deflation,PMV was placed. Pt then conversed w/ Daughter in room in adequate vocal quality w/ no increased RR effort/rate noted. Pt denied any distress while wearing the PMV. Ice Chip training given w/ instructions for Dtr to monitor and give cues to pt for 1 ice chip at a time during self-feeding -- put only 2-3 chips in the cup at a time also. Education and hands-on training given to Daughter on the PMV and use/wear/care as well as aspiration precautions during any oral intake.  Dtr asked good questions.  Recommendfrequent oral care for hygiene and stimulation of swallowing.Recommend PRN wear/use of PMV for verbal communication w/ others and w/ ALL oral intake w/ 100% Supervision and following precautions as posted above bed in room. PMV wear will aid in Pulmonary status improvement and in engagement and communication w/ other to support Cognitive/mental status improvement and awareness. Encouraged Dtr to call and talk to pt via phone having [facility] place and monitor the PMV. Recommend Pleasure PRN Single ice chips while wearing the PMV(MUST WEAR PMV FOR ORAL INTAKE) following aspiration precautions.  Recommend continue w/ a Dysphagia level 1 (puree) diet w/ Nectar consistency liquids Via Cup; general aspiration precautions. Support at meals to encourage oral intake, feeding when needed. Reduce distractions at meals. Pt MUST WEAR THE PMV W/ ALL ORAL INTAKE. Recommend Dietician f/u for support. Daughter agreed w/ POC via phone call. NSG/MD updated.    HPI HPI: Pt is a 70 y.o. female who admitted on 02/21/2021 w/ Diverticulitis with Colovesical fistula and UTI, now 12 Days Post-Op s/p Hartman's Procedure for complicated diverticulitis with gangrenous changes and perforation with feculent peritonitis and 4 days s/p colostomy revision.  Pt remained on vent failing wean; Tracheostomy placed on 03/08/2021.  Pt is now on Trach Collar O2 support weaning from full vent support on 03/15/2021.  Ongoing Wound Care.  MBSS completed on 03/22/2021.      SLP Plan  Continue with current plan of care       Recommendations  Diet recommendations: Dysphagia 1 (puree);Nectar-thick liquid (w/ PMV placed) Liquids provided via: Cup;No straw Medication Administration: Crushed with puree Supervision: Patient able to self feed;Staff to assist with self feeding;Full supervision/cueing for  compensatory strategies Compensations: Minimize environmental distractions;Slow rate;Small sips/bites;Lingual sweep for  clearance of pocketing;Multiple dry swallows after each bite/sip;Follow solids with liquid Postural Changes and/or Swallow Maneuvers: Out of bed for meals;Seated upright 90 degrees;Upright 30-60 min after meal      Patient may use Passy-Muir Speech Valve: Intermittently with supervision;During all therapies with supervision;During all waking hours (remove during sleep);During PO intake/meals PMSV Supervision: Full MD: Please consider changing trach tube to : Cuffless         General recommendations:  (Rehab following) Oral Care Recommendations: Oral care BID;Oral care before and after PO;Staff/trained caregiver to provide oral care Follow up Recommendations: Skilled Nursing facility (TBD) SLP Visit Diagnosis: Dysphagia, oropharyngeal phase (R13.12);Aphonia (R49.1) Plan: Continue with current plan of care       GO               [name], [place], CCC-SLP Speech Language Pathologist Rehab Services [phone] [facility] 03/24/2021, 12:27 PM

## 2021-03-24 NOTE — Progress Notes (Signed)
[place] for Electrolyte Monitoring and Replacement   Recent Labs: Potassium (mmol/L)  Date Value  03/24/2021 3.6   Magnesium (mg/dL)  Date Value  03/24/2021 2.1   Calcium (mg/dL)  Date Value  03/24/2021 8.8 (L)   Albumin (g/dL)  Date Value  03/16/2021 2.4 (L)   Phosphorus (mg/dL)  Date Value  03/24/2021 5.0 (H)   Sodium (mmol/L)  Date Value  03/24/2021 142   Assessment: Desiree Madden is a 70 y.o. female admitted on 02/21/2021 with intra-abdominal infection (diverticulitis w/ fecal perforation). Patient septic with acute diverticulitis abdominal abscess with colovesical fistula and enterococcus urinary tract infection which was further complicated by the development of complicated diverticulitis with perforation and gangrenous changes requiring Hartman's procedure performed on 03/01/21. PMH includes CAD s/p CABG (2015), HTN, hypothyroidism, colon bladder fistula, diverticulitis, and ulcerative colitis. Pharmacy was consulted for electrolyte monitoring.  Nutrition: Tube feeds stopped. SLP has cleared patient for dysphagia 1 diet  Goal of Therapy:  K 4.0 - 5.1 mmol/L Mg 2.0 - 2.4 mg/dL All Other Electrolytes WNL  Plan:  K 3.6  Mag 2.1  Phos 5.0  Scr 0.97  Na 142  No electrolyte replacement warranted today  Sodium stable  Re-check electrolytes with AM labs  Ahilyn Nell A 03/24/2021 10:20 AM

## 2021-03-24 NOTE — Progress Notes (Addendum)
Progress Note    Desiree Madden  PYP:[id number] DOB: [birth year]  DOA: 02/21/2021 PCP: [place]      Brief Narrative:    Medical records reviewed and are as summarized below:  Desiree Madden is a 70 y.o. female with medical history significant for colonic bladder fistula, diverticulitis, hypertension, hypothyroidism, hyperlipidemia, ulcerative colitis, who presented to the hospital on 02/21/2021 because of abdominal pain.  She was admitted to the hospital for acute diverticulitis.  Hospital course was complicated by perforated diverticulitis and she underwent emergent sigmoid colectomy with end colostomy and Hartman's procedure on 03/01/2021.  She was transferred to the ICU postoperatively.  She developed septic shock requiring multiple vasopressors.  She remained intubated on mechanical ventilation postoperatively.  She could not be liberated from the ventilator.  She failed extubation on 03/06/2021 and was reintubated.  ENT was consulted and tracheostomy was performed on 03/08/2021.      Assessment/Plan:   Principal Problem:   Diverticulitis Active Problems:   Fistula, bladder   Ulcerative colitis ([place])   Hyperlipidemia   Essential hypertension   Hypothyroidism   Abnormal ECG   CKD (chronic kidney disease), stage III (HCC)   Elevated lactic acid level   Protein-calorie malnutrition, severe   Nutrition Problem: Severe Malnutrition Etiology: acute illness  Signs/Symptoms: moderate fat depletion,moderate muscle depletion,severe muscle depletion   Body mass index is 21.07 kg/m.     Septic shock, hemorrhagic shock secondary to complicated diverticulitis with perforation, pelvic abscess, gangrene of the sigmoid colon: Shock physiology resolved.  S/p sigmoid colectomy with end colostomy, Hartman's procedure on 03/01/2021.  Completed antibiotics on 03/15/2021.  She has a wound VAC in place.  Continue local wound care.  Follow-up with general surgeon.      Dysphagia, Severe malnutrition: She remains on dysphagia 1 diet and nectar thick liquids.  Plan discussed with speech therapist and her daughter.  Daughter understands that she may have to consider PEG tube placement for enteral nutrition if patient is not adequately nourished by mouth.  Acute on chronic hypoxic respiratory failure: Failure to wean from ventilator requiring tracheostomy.  Continue oxygen via trach collar.  SVT, sinus tachycardia, atrial fibrillation: Continue amiodarone and Eliquis  NSTEMI, ischemic cardiomyopathy, chronic systolic and diastolic CHF: Continue Toprol  2D echo showed EF estimated at 20 to 25% and grade 1 diastolic dysfunction.  Acute urinary retention: Urologist recommended keeping Foley catheter for 1 to 2 weeks from 03/16/2021.  Cystogram is recommended prior to Foley catheter removal.  Plan discussed with her daughter, [name], at the bedside.      Diet Order            DIET - DYS 1 Room service appropriate? Yes with Assist; Fluid consistency: Nectar Thick  Diet effective now                    Consultants:  General surgeon  [place]  ENT surgeon  Urologist  Procedures:  Tracheostomy on 03/08/2021  Sigmoid colectomy with colostomy and Hartman's procedure  Cystorrhaphy by urologist on 03/02/2019 for colovesical fistula and diverticulitis/pelvic abscess    Medications:   . amiodarone  200 mg Oral BID  . apixaban  5 mg Oral BID  . vitamin C  500 mg Oral BID  . chlorhexidine gluconate (MEDLINE KIT)  15 mL Mouth Rinse BID  . Chlorhexidine Gluconate Cloth  6 each Topical Daily  . docusate  100 mg Oral BID  . feeding supplement (NEPRO CARB STEADY)  237  mL Oral TID BM  . insulin aspart  0-5 Units Subcutaneous QHS  . insulin aspart  0-9 Units Subcutaneous TID WC  . levothyroxine  75 mcg Oral Q0600  . mouth rinse  15 mL Mouth Rinse 10 times per day  . metoprolol succinate  12.5 mg Oral Daily  . midodrine  10 mg Oral TID WC   . multivitamin with minerals  1 tablet Oral Daily  . pantoprazole  40 mg Oral Daily  . polyethylene glycol  17 g Oral Daily  . sodium chloride flush  10-40 mL Intracatheter Q12H  . sodium chloride flush  5 mL Intracatheter Q8H   Continuous Infusions: . sodium chloride Stopped (03/20/21 1026)     Anti-infectives (From admission, onward)   Start     Dose/Rate Route Frequency Ordered Stop   03/13/21 1400  meropenem (MERREM) 1 g in sodium chloride 0.9 % 100 mL IVPB        1 g 200 mL/hr over 30 Minutes Intravenous Every 8 hours 03/13/21 0855 03/16/21 2203   03/12/21 1800  meropenem (MERREM) 1 g in sodium chloride 0.9 % 100 mL IVPB  Status:  Discontinued        1 g 200 mL/hr over 30 Minutes Intravenous Every 12 hours 03/12/21 0840 03/13/21 0855   03/10/21 1000  meropenem (MERREM) 1 g in sodium chloride 0.9 % 100 mL IVPB  Status:  Discontinued        1 g 200 mL/hr over 30 Minutes Intravenous Every 8 hours 03/10/21 0854 03/12/21 0840   03/09/21 0600  fluconazole (DIFLUCAN) IVPB 200 mg  Status:  Discontinued        200 mg 100 mL/hr over 60 Minutes Intravenous Every 24 hours 03/08/21 0751 03/14/21 1014   03/08/21 2200  meropenem (MERREM) 1 g in sodium chloride 0.9 % 100 mL IVPB  Status:  Discontinued        1 g 200 mL/hr over 30 Minutes Intravenous Every 12 hours 03/08/21 0748 03/10/21 0854   03/08/21 0600  fluconazole (DIFLUCAN) IVPB 400 mg  Status:  Discontinued        400 mg 100 mL/hr over 120 Minutes Intravenous Every 24 hours 03/07/21 1321 03/08/21 0839   03/07/21 1100  meropenem (MERREM) 1 g in sodium chloride 0.9 % 100 mL IVPB  Status:  Discontinued        1 g 200 mL/hr over 30 Minutes Intravenous Every 8 hours 03/07/21 1013 03/08/21 0748   03/07/21 1000  meropenem (MERREM) 1 g in sodium chloride 0.9 % 100 mL IVPB  Status:  Discontinued        1 g 200 mL/hr over 30 Minutes Intravenous Every 12 hours 03/06/21 2243 03/07/21 1013   03/07/21 0600  fluconazole (DIFLUCAN) IVPB 400 mg         400 mg 100 mL/hr over 120 Minutes Intravenous  Once 03/06/21 2243 03/07/21 0701   03/04/21 1800  vancomycin (VANCOREADY) IVPB 750 mg/150 mL        750 mg 150 mL/hr over 60 Minutes Intravenous Every 24 hours 03/03/21 1442 03/06/21 2040   03/03/21 0800  vancomycin (VANCOREADY) IVPB 1500 mg/300 mL  Status:  Discontinued        1,500 mg 150 mL/hr over 120 Minutes Intravenous Every 48 hours 03/02/21 1410 03/03/21 1442   03/02/21 0800  vancomycin (VANCOREADY) IVPB 750 mg/150 mL  Status:  Discontinued        750 mg 150 mL/hr over 60 Minutes Intravenous Every 24  hours 03/01/21 1954 03/02/21 1409   03/01/21 2200  ceFEPIme (MAXIPIME) 2 g in sodium chloride 0.9 % 100 mL IVPB        2 g 200 mL/hr over 30 Minutes Intravenous Every 12 hours 03/01/21 1903 03/06/21 2203   03/01/21 2100  vancomycin (VANCOREADY) IVPB 1250 mg/250 mL        1,250 mg 166.7 mL/hr over 90 Minutes Intravenous  Once 03/01/21 1954 03/02/21 0008   03/01/21 2000  metroNIDAZOLE (FLAGYL) IVPB 500 mg        500 mg 100 mL/hr over 60 Minutes Intravenous Every 8 hours 03/01/21 1903 03/06/21 2002   02/28/21 1800  piperacillin-tazobactam (ZOSYN) IVPB 3.375 g  Status:  Discontinued        3.375 g 12.5 mL/hr over 240 Minutes Intravenous Every 8 hours 02/28/21 1643 03/01/21 1903   02/28/21 1730  piperacillin-tazobactam (ZOSYN) IVPB 3.375 g  Status:  Discontinued        3.375 g 100 mL/hr over 30 Minutes Intravenous Every 8 hours 02/28/21 1635 02/28/21 1642   02/28/21 1300  Ampicillin-Sulbactam (UNASYN) 3 g in sodium chloride 0.9 % 100 mL IVPB  Status:  Discontinued        3 g 200 mL/hr over 30 Minutes Intravenous Every 6 hours 02/28/21 1156 02/28/21 1635   02/23/21 1230  piperacillin-tazobactam (ZOSYN) IVPB 3.375 g  Status:  Discontinued        3.375 g 12.5 mL/hr over 240 Minutes Intravenous Every 8 hours 02/23/21 1143 02/28/21 1155   02/22/21 0600  ciprofloxacin (CIPRO) IVPB 400 mg  Status:  Discontinued        400 mg 200 mL/hr over  60 Minutes Intravenous Every 12 hours 02/21/21 2028 02/23/21 1143   02/22/21 0400  metroNIDAZOLE (FLAGYL) IVPB 500 mg  Status:  Discontinued        500 mg 100 mL/hr over 60 Minutes Intravenous Every 8 hours 02/21/21 2028 02/23/21 1143   02/21/21 1830  ciprofloxacin (CIPRO) IVPB 400 mg        400 mg 200 mL/hr over 60 Minutes Intravenous  Once 02/21/21 1820 02/21/21 1939   02/21/21 1830  metroNIDAZOLE (FLAGYL) IVPB 500 mg        500 mg 100 mL/hr over 60 Minutes Intravenous  Once 02/21/21 1820 02/21/21 2110             Family Communication/Anticipated D/C date and plan/Code Status   DVT prophylaxis: SCDs Start: 02/21/21 2200     Code Status: DNR  Family Communication: Discussed with her daughter, [name]. Disposition Plan:    Status is: Inpatient  Remains inpatient appropriate because:Inpatient level of care appropriate due to severity of illness   Dispo: The patient is from: Home              Anticipated d/c is to: SNF              Patient currently is not medically stable to d/c.   Difficult to place patient No           Subjective:   Interval events noted.  Her daughter was at the bedside.  No shortness of breath or chest pain.  Objective:    Vitals:   03/24/21 0359 03/24/21 0500 03/24/21 0744 03/24/21 1200  BP: 101/69  (!) 147/81 121/79  Pulse: 83  85 90  Resp: _0 Temp: 97.9 F (36.6 C)  98 F (36.7 C) 98.1 F (36.7 C)  TempSrc: Axillary  Oral Oral  SpO2: 97%  100% 99%  Weight:  55.7 kg    Height:       No data found.   Intake/Output Summary (Last 24 hours) at 03/24/2021 1311 Last data filed at 03/24/2021 1200 Gross per 24 hour  Intake 510 ml  Output 750 ml  Net -240 ml   Filed Weights   03/22/21 0500 03/23/21 0539 03/24/21 0500  Weight: 57.7 kg 63.1 kg 55.7 kg    Exam:  GEN: NAD SKIN: Warm and dry EYES: No pallor no icterus ENT: MMM, + tracheostomy CV: RRR PULM: CTA B ABD: soft, ND, NT, +BS, + wound VAC +  colostomy CNS: AAO x 2 (person and place), non focal EXT: No edema or tenderness GU: Foley catheter draining amber urine         Data Reviewed:   I have personally reviewed following labs and imaging studies:  Labs: Labs show the following:   Basic Metabolic Panel: Recent Labs  Lab 03/20/21 0530 03/21/21 0318 03/22/21 0616 03/23/21 0440 03/24/21 0436  NA 139 139 141 140 142  K 4.1 3.7 4.8 3.6 3.6  CL 110 110 111 110 110  CO2 20* _0 GLUCOSE 146* 131* 136* 97 87  BUN 65* 60* 59* 52* 46*  CREATININE 0.79 0.88 0.90 0.81 0.97  CALCIUM 8.4* 8.6* 8.6* 8.9 8.8*  MG 1.8 1.7 2.3 2.0 2.1  PHOS 3.9 3.7 3.6 3.9 5.0*   GFR Estimated Creatinine Clearance: 46.6 mL/min (by C-G formula based on SCr of 0.97 mg/dL). Liver Function Tests: No results for input(s): AST, ALT, ALKPHOS, BILITOT, PROT, ALBUMIN in the last 168 hours. No results for input(s): LIPASE, AMYLASE in the last 168 hours. No results for input(s): AMMONIA in the last 168 hours. Coagulation profile No results for input(s): INR, PROTIME in the last 168 hours.  CBC: Recent Labs  Lab 03/18/21 0627 03/19/21 0525 03/20/21 0530 03/21/21 0318 03/22/21 0616 03/23/21 0440  WBC 8.5 8.0 7.5 8.3 7.5 6.2  NEUTROABS 6.6  --   --   --   --   --   HGB 9.8* 9.7* 8.8* 8.8* 9.6* 8.7*  HCT 30.4* 30.4* 27.4* 27.3* 29.6* 26.7*  MCV 95.3 95.3 96.1 94.8 95.8 95.0  PLT 192 186 174 177 204 253   Cardiac Enzymes: No results for input(s): CKTOTAL, CKMB, CKMBINDEX, TROPONINI in the last 168 hours. BNP (last 3 results) No results for input(s): PROBNP in the last 8760 hours. CBG: Recent Labs  Lab 03/23/21 2106 03/23/21 2306 03/24/21 0437 03/24/21 0745 03/24/21 1139  GLUCAP 74 126* 85 100* 174*   D-Dimer: No results for input(s): DDIMER in the last 72 hours. Hgb A1c: No results for input(s): HGBA1C in the last 72 hours. Lipid Profile: No results for input(s): CHOL, HDL, LDLCALC, TRIG, CHOLHDL, LDLDIRECT in the  last 72 hours. Thyroid function studies: No results for input(s): TSH, T4TOTAL, T3FREE, THYROIDAB in the last 72 hours.  Invalid input(s): FREET3 Anemia work up: No results for input(s): VITAMINB12, FOLATE, FERRITIN, TIBC, IRON, RETICCTPCT in the last 72 hours. Sepsis Labs: Recent Labs  Lab 03/20/21 0530 03/21/21 0318 03/22/21 0616 03/23/21 0440  WBC 7.5 8.3 7.5 6.2    Microbiology No results found for this or any previous visit (from the past 240 hour(s)).  Procedures and diagnostic studies:  No results found.             LOS: 31 days   [place] [employer], advertising on [email or web address].  If 7PM-7AM, please contact night-coverage at [email or web address]     03/24/2021, 1:11 PM

## 2021-03-25 DIAGNOSIS — K5792 Diverticulitis of intestine, part unspecified, without perforation or abscess without bleeding: Secondary | ICD-10-CM | POA: Diagnosis not present

## 2021-03-25 DIAGNOSIS — E43 Unspecified severe protein-calorie malnutrition: Secondary | ICD-10-CM | POA: Diagnosis not present

## 2021-03-25 LAB — GLUCOSE, CAPILLARY
Glucose-Capillary: 92 mg/dL (ref 70–99)
Glucose-Capillary: 157 mg/dL — ABNORMAL HIGH (ref 70–99)
Glucose-Capillary: 137 mg/dL — ABNORMAL HIGH (ref 70–99)
Glucose-Capillary: 121 mg/dL — ABNORMAL HIGH (ref 70–99)

## 2021-03-25 NOTE — Progress Notes (Signed)
[place] for Electrolyte Monitoring and Replacement   Recent Labs: Potassium (mmol/L)  Date Value  03/24/2021 3.6   Magnesium (mg/dL)  Date Value  03/24/2021 2.1   Calcium (mg/dL)  Date Value  03/24/2021 8.8 (L)   Albumin (g/dL)  Date Value  03/16/2021 2.4 (L)   Phosphorus (mg/dL)  Date Value  03/24/2021 5.0 (H)   Sodium (mmol/L)  Date Value  03/24/2021 142   Assessment: Desiree Madden is a 70 y.o. female admitted on 02/21/2021 with intra-abdominal infection (diverticulitis w/ fecal perforation). Patient septic with acute diverticulitis abdominal abscess with colovesical fistula and enterococcus urinary tract infection which was further complicated by the development of complicated diverticulitis with perforation and gangrenous changes requiring Hartman's procedure performed on 03/01/21. PMH includes CAD s/p CABG (2015), HTN, hypothyroidism, colon bladder fistula, diverticulitis, and ulcerative colitis. Pharmacy was consulted for electrolyte monitoring.  Nutrition: Tube feeds stopped. SLP has cleared patient for dysphagia 1 diet  Goal of Therapy:  K 4.0 - 5.1 mmol/L Mg 2.0 - 2.4 mg/dL All Other Electrolytes WNL  Plan:   MD discontinued labs for today.  Re-check electrolytes with AM labs  Desiree Madden A 03/25/2021 8:35 AM

## 2021-03-25 NOTE — Progress Notes (Signed)
Progress Note    Desiree Madden  ZPH:[id number] DOB: 12/31/1950  DOA: 02/21/2021 PCP: [place]      Brief Narrative:    Medical records reviewed and are as summarized below:  Desiree Madden is a 70 y.o. female with medical history significant for colonic bladder fistula, diverticulitis, hypertension, hypothyroidism, hyperlipidemia, ulcerative colitis, who presented to the hospital on 02/21/2021 because of abdominal pain.  She was admitted to the hospital for acute diverticulitis.  Hospital course was complicated by perforated diverticulitis and she underwent emergent sigmoid colectomy with end colostomy and Hartman's procedure on 03/01/2021.  She was transferred to the ICU postoperatively.  She developed septic shock requiring multiple vasopressors.  She remained intubated on mechanical ventilation postoperatively.  She could not be liberated from the ventilator.  She failed extubation on 03/06/2021 and was reintubated.  ENT was consulted and tracheostomy was performed on 03/08/2021.      Assessment/Plan:   Principal Problem:   Diverticulitis Active Problems:   Fistula, bladder   Ulcerative colitis ([place])   Hyperlipidemia   Essential hypertension   Hypothyroidism   Abnormal ECG   CKD (chronic kidney disease), stage III (HCC)   Elevated lactic acid level   Protein-calorie malnutrition, severe   Nutrition Problem: Severe Malnutrition Etiology: acute illness  Signs/Symptoms: moderate fat depletion,moderate muscle depletion,severe muscle depletion   Body mass index is 20.2 kg/m.     Septic shock, hemorrhagic shock secondary to complicated diverticulitis with perforation, pelvic abscess, gangrene of the sigmoid colon: Shock physiology resolved.  S/p sigmoid colectomy with end colostomy, Hartman's procedure on 03/01/2021.  Completed antibiotics on 03/15/2021.  She has a wound VAC in place.  Continue local wound care.  Follow-up with general surgeon.     Dysphagia,  Severe malnutrition: Continue dysphagia 1 diet and nectar thick liquids.  Acute on chronic hypoxic respiratory failure: Failure to wean from ventilator requiring tracheostomy.  Continue oxygen via trach collar.  SVT, sinus tachycardia, atrial fibrillation: Continue amiodarone and Eliquis.  NSTEMI, ischemic cardiomyopathy, chronic systolic and diastolic CHF: BP stable on Toprol.  2D echo showed EF estimated at 20 to 25% and grade 1 diastolic dysfunction.  Acute urinary retention: Urologist recommended keeping Foley catheter for 1 to 2 weeks from 03/16/2021.  Cystogram is recommended prior to Foley catheter removal.        Diet Order            DIET - DYS 1 Room service appropriate? Yes with Assist; Fluid consistency: Nectar Thick  Diet effective now                    Consultants:  General surgeon  Advance  ENT surgeon  Urologist  Procedures:  Tracheostomy on 03/08/2021  Sigmoid colectomy with colostomy and Hartman's procedure  Cystorrhaphy by urologist on 03/02/2019 for colovesical fistula and diverticulitis/pelvic abscess    Medications:   . amiodarone  200 mg Oral BID  . apixaban  5 mg Oral BID  . vitamin C  500 mg Oral BID  . chlorhexidine gluconate (MEDLINE KIT)  15 mL Mouth Rinse BID  . Chlorhexidine Gluconate Cloth  6 each Topical Daily  . docusate  100 mg Oral BID  . feeding supplement (NEPRO CARB STEADY)  237 mL Oral TID BM  . insulin aspart  0-5 Units Subcutaneous QHS  . insulin aspart  0-9 Units Subcutaneous TID WC  . levothyroxine  75 mcg Oral Q0600  . mouth rinse  15 mL Mouth Rinse  10 times per day  . metoprolol succinate  12.5 mg Oral Daily  . midodrine  10 mg Oral TID WC  . multivitamin with minerals  1 tablet Oral Daily  . pantoprazole  40 mg Oral Daily  . polyethylene glycol  17 g Oral Daily  . sodium chloride flush  10-40 mL Intracatheter Q12H  . sodium chloride flush  5 mL Intracatheter Q8H   Continuous Infusions: . sodium  chloride Stopped (03/20/21 1026)     Anti-infectives (From admission, onward)   Start     Dose/Rate Route Frequency Ordered Stop   03/13/21 1400  meropenem (MERREM) 1 g in sodium chloride 0.9 % 100 mL IVPB        1 g 200 mL/hr over 30 Minutes Intravenous Every 8 hours 03/13/21 0855 03/16/21 2203   03/12/21 1800  meropenem (MERREM) 1 g in sodium chloride 0.9 % 100 mL IVPB  Status:  Discontinued        1 g 200 mL/hr over 30 Minutes Intravenous Every 12 hours 03/12/21 0840 03/13/21 0855   03/10/21 1000  meropenem (MERREM) 1 g in sodium chloride 0.9 % 100 mL IVPB  Status:  Discontinued        1 g 200 mL/hr over 30 Minutes Intravenous Every 8 hours 03/10/21 0854 03/12/21 0840   03/09/21 0600  fluconazole (DIFLUCAN) IVPB 200 mg  Status:  Discontinued        200 mg 100 mL/hr over 60 Minutes Intravenous Every 24 hours 03/08/21 0751 03/14/21 1014   03/08/21 2200  meropenem (MERREM) 1 g in sodium chloride 0.9 % 100 mL IVPB  Status:  Discontinued        1 g 200 mL/hr over 30 Minutes Intravenous Every 12 hours 03/08/21 0748 03/10/21 0854   03/08/21 0600  fluconazole (DIFLUCAN) IVPB 400 mg  Status:  Discontinued        400 mg 100 mL/hr over 120 Minutes Intravenous Every 24 hours 03/07/21 1321 03/08/21 0839   03/07/21 1100  meropenem (MERREM) 1 g in sodium chloride 0.9 % 100 mL IVPB  Status:  Discontinued        1 g 200 mL/hr over 30 Minutes Intravenous Every 8 hours 03/07/21 1013 03/08/21 0748   03/07/21 1000  meropenem (MERREM) 1 g in sodium chloride 0.9 % 100 mL IVPB  Status:  Discontinued        1 g 200 mL/hr over 30 Minutes Intravenous Every 12 hours 03/06/21 2243 03/07/21 1013   03/07/21 0600  fluconazole (DIFLUCAN) IVPB 400 mg        400 mg 100 mL/hr over 120 Minutes Intravenous  Once 03/06/21 2243 03/07/21 0701   03/04/21 1800  vancomycin (VANCOREADY) IVPB 750 mg/150 mL        750 mg 150 mL/hr over 60 Minutes Intravenous Every 24 hours 03/03/21 1442 03/06/21 2040   03/03/21 0800   vancomycin (VANCOREADY) IVPB 1500 mg/300 mL  Status:  Discontinued        1,500 mg 150 mL/hr over 120 Minutes Intravenous Every 48 hours 03/02/21 1410 03/03/21 1442   03/02/21 0800  vancomycin (VANCOREADY) IVPB 750 mg/150 mL  Status:  Discontinued        750 mg 150 mL/hr over 60 Minutes Intravenous Every 24 hours 03/01/21 1954 03/02/21 1409   03/01/21 2200  ceFEPIme (MAXIPIME) 2 g in sodium chloride 0.9 % 100 mL IVPB        2 g 200 mL/hr over 30 Minutes Intravenous Every 12 hours  03/01/21 1903 03/06/21 2203   03/01/21 2100  vancomycin (VANCOREADY) IVPB 1250 mg/250 mL        1,250 mg 166.7 mL/hr over 90 Minutes Intravenous  Once 03/01/21 1954 03/02/21 0008   03/01/21 2000  metroNIDAZOLE (FLAGYL) IVPB 500 mg        500 mg 100 mL/hr over 60 Minutes Intravenous Every 8 hours 03/01/21 1903 03/06/21 2002   02/28/21 1800  piperacillin-tazobactam (ZOSYN) IVPB 3.375 g  Status:  Discontinued        3.375 g 12.5 mL/hr over 240 Minutes Intravenous Every 8 hours 02/28/21 1643 03/01/21 1903   02/28/21 1730  piperacillin-tazobactam (ZOSYN) IVPB 3.375 g  Status:  Discontinued        3.375 g 100 mL/hr over 30 Minutes Intravenous Every 8 hours 02/28/21 1635 02/28/21 1642   02/28/21 1300  Ampicillin-Sulbactam (UNASYN) 3 g in sodium chloride 0.9 % 100 mL IVPB  Status:  Discontinued        3 g 200 mL/hr over 30 Minutes Intravenous Every 6 hours 02/28/21 1156 02/28/21 1635   02/23/21 1230  piperacillin-tazobactam (ZOSYN) IVPB 3.375 g  Status:  Discontinued        3.375 g 12.5 mL/hr over 240 Minutes Intravenous Every 8 hours 02/23/21 1143 02/28/21 1155   02/22/21 0600  ciprofloxacin (CIPRO) IVPB 400 mg  Status:  Discontinued        400 mg 200 mL/hr over 60 Minutes Intravenous Every 12 hours 02/21/21 2028 02/23/21 1143   02/22/21 0400  metroNIDAZOLE (FLAGYL) IVPB 500 mg  Status:  Discontinued        500 mg 100 mL/hr over 60 Minutes Intravenous Every 8 hours 02/21/21 2028 02/23/21 1143   02/21/21 1830   ciprofloxacin (CIPRO) IVPB 400 mg        400 mg 200 mL/hr over 60 Minutes Intravenous  Once 02/21/21 1820 02/21/21 1939   02/21/21 1830  metroNIDAZOLE (FLAGYL) IVPB 500 mg        500 mg 100 mL/hr over 60 Minutes Intravenous  Once 02/21/21 1820 02/21/21 2110             Family Communication/Anticipated D/C date and plan/Code Status   DVT prophylaxis: SCDs Start: 02/21/21 2200     Code Status: DNR  Family Communication: None. Disposition Plan:    Status is: Inpatient  Remains inpatient appropriate because:Inpatient level of care appropriate due to severity of illness   Dispo: The patient is from: Home              Anticipated d/c is to: SNF              Patient currently is not medically stable to d/c.   Difficult to place patient No           Subjective:   Interval events noted.  She feels better today.  Objective:    Vitals:   03/25/21 0500 03/25/21 0607 03/25/21 0802 03/25/21 1105  BP:   118/68 129/66  Pulse:   81 81  Resp:   14 17  Temp:   98 F (36.7 C)   TempSrc:   Axillary   SpO2:  100% 99% 100%  Weight: 53.4 kg     Height:       No data found.   Intake/Output Summary (Last 24 hours) at 03/25/2021 1140 Last data filed at 03/25/2021 0600 Gross per 24 hour  Intake 435 ml  Output 1000 ml  Net -565 ml   [employer]  03/23/21 0539 03/24/21 0500 03/25/21 0500  Weight: 63.1 kg 55.7 kg 53.4 kg    Exam:  GEN: NAD SKIN: Warm and dry EYES: No pallor or icterus ENT: MMM CV: RRR PULM: CTA B ABD: soft, ND, NT, +BS, +15, + colostomy CNS: AAO x 3, non focal EXT: No edema or tenderness GU: Foley catheter draining amber urine        Data Reviewed:   I have personally reviewed following labs and imaging studies:  Labs: Labs show the following:   Basic Metabolic Panel: Recent Labs  Lab 03/20/21 0530 03/21/21 0318 03/22/21 0616 03/23/21 0440 03/24/21 0436  NA 139 139 141 140 142  K 4.1 3.7 4.8 3.6 3.6  CL 110 110 111  110 110  CO2 20* 22 23 22 24   GLUCOSE 146* 131* 136* 97 87  BUN 65* 60* 59* 52* 46*  CREATININE 0.79 0.88 0.90 0.81 0.97  CALCIUM 8.4* 8.6* 8.6* 8.9 8.8*  MG 1.8 1.7 2.3 2.0 2.1  PHOS 3.9 3.7 3.6 3.9 5.0*   GFR Estimated Creatinine Clearance: 45.5 mL/min (by C-G formula based on SCr of 0.97 mg/dL). Liver Function Tests: No results for input(s): AST, ALT, ALKPHOS, BILITOT, PROT, ALBUMIN in the last 168 hours. No results for input(s): LIPASE, AMYLASE in the last 168 hours. No results for input(s): AMMONIA in the last 168 hours. Coagulation profile No results for input(s): INR, PROTIME in the last 168 hours.  CBC: Recent Labs  Lab 03/19/21 0525 03/20/21 0530 03/21/21 0318 03/22/21 0616 03/23/21 0440  WBC 8.0 7.5 8.3 7.5 6.2  HGB 9.7* 8.8* 8.8* 9.6* 8.7*  HCT 30.4* 27.4* 27.3* 29.6* 26.7*  MCV 95.3 96.1 94.8 95.8 95.0  PLT 186 174 177 204 253   Cardiac Enzymes: No results for input(s): CKTOTAL, CKMB, CKMBINDEX, TROPONINI in the last 168 hours. BNP (last 3 results) No results for input(s): PROBNP in the last 8760 hours. CBG: Recent Labs  Lab 03/24/21 0745 03/24/21 1139 03/24/21 1620 03/24/21 2019 03/25/21 0851  GLUCAP 100* 174* 111* 141* 92   D-Dimer: No results for input(s): DDIMER in the last 72 hours. Hgb A1c: No results for input(s): HGBA1C in the last 72 hours. Lipid Profile: No results for input(s): CHOL, HDL, LDLCALC, TRIG, CHOLHDL, LDLDIRECT in the last 72 hours. Thyroid function studies: No results for input(s): TSH, T4TOTAL, T3FREE, THYROIDAB in the last 72 hours.  Invalid input(s): FREET3 Anemia work up: No results for input(s): VITAMINB12, FOLATE, FERRITIN, TIBC, IRON, RETICCTPCT in the last 72 hours. Sepsis Labs: Recent Labs  Lab 03/20/21 0530 03/21/21 0318 03/22/21 0616 03/23/21 0440  WBC 7.5 8.3 7.5 6.2    Microbiology No results found for this or any previous visit (from the past 240 hour(s)).  Procedures and diagnostic studies:  No  results found.             LOS: 32 days   [place] [employer], advertising on [email or web address]. If 7PM-7AM, please contact night-coverage at [email or web address]     03/25/2021, 11:40 AM

## 2021-03-25 NOTE — Progress Notes (Signed)
Report given to 1C RN Amandeep at 1026. Pt transferred to 1C via bed at 1100. All belongings transferred with pt. Handed off to [employer]. Pt alert and oriented x 3.

## 2021-03-26 DIAGNOSIS — E43 Unspecified severe protein-calorie malnutrition: Secondary | ICD-10-CM | POA: Diagnosis not present

## 2021-03-26 DIAGNOSIS — K5792 Diverticulitis of intestine, part unspecified, without perforation or abscess without bleeding: Secondary | ICD-10-CM | POA: Diagnosis not present

## 2021-03-26 LAB — BASIC METABOLIC PANEL
Anion gap: 6 (ref 5–15)
BUN: 26 mg/dL — ABNORMAL HIGH (ref 8–23)
CO2: 18 mmol/L — ABNORMAL LOW (ref 22–32)
Calcium: 6.5 mg/dL — ABNORMAL LOW (ref 8.9–10.3)
Chloride: 116 mmol/L — ABNORMAL HIGH (ref 98–111)
Creatinine, Ser: 0.68 mg/dL (ref 0.44–1.00)
GFR, Estimated: >60 mL/min (ref >60)
Glucose, Bld: 82 mg/dL (ref 70–99)
Potassium: 2.5 mmol/L — CL (ref 3.5–5.1)
Sodium: 140 mmol/L (ref 135–145)

## 2021-03-26 LAB — GLUCOSE, CAPILLARY
Glucose-Capillary: 89 mg/dL (ref 70–99)
Glucose-Capillary: 140 mg/dL — ABNORMAL HIGH (ref 70–99)
Glucose-Capillary: 132 mg/dL — ABNORMAL HIGH (ref 70–99)
Glucose-Capillary: 106 mg/dL — ABNORMAL HIGH (ref 70–99)

## 2021-03-26 LAB — RESP PANEL BY RT-PCR (FLU A&B, COVID) ARPGX2
Influenza A by PCR: NEGATIVE
Influenza B by PCR: NEGATIVE
SARS Coronavirus 2 by RT PCR: NEGATIVE

## 2021-03-26 LAB — CBC
HCT: 29.6 % — ABNORMAL LOW (ref 36.0–46.0)
Hemoglobin: 9.6 g/dL — ABNORMAL LOW (ref 12.0–15.0)
MCH: 30.4 pg (ref 26.0–34.0)
MCHC: 32.4 g/dL (ref 30.0–36.0)
MCV: 93.7 fL (ref 80.0–100.0)
Platelets: 381 10*3/uL (ref 150–400)
RBC: 3.16 MIL/uL — ABNORMAL LOW (ref 3.87–5.11)
RDW: 15.5 % (ref 11.5–15.5)
WBC: 6.2 10*3/uL (ref 4.0–10.5)
nRBC: 0 % (ref 0.0–0.2)

## 2021-03-26 LAB — PHOSPHORUS: Phosphorus: 3.2 mg/dL (ref 2.5–4.6)

## 2021-03-26 LAB — MAGNESIUM: Magnesium: 1.4 mg/dL — ABNORMAL LOW (ref 1.7–2.4)

## 2021-03-26 MED ORDER — MAGNESIUM SULFATE 2 GM/50ML IV SOLN
2.0000 g | Freq: Once | INTRAVENOUS | Status: AC
  Administered 2021-03-26: 08:00:00 2 g via INTRAVENOUS
  Filled 2021-03-26: qty 50

## 2021-03-26 MED ORDER — SODIUM BICARBONATE 650 MG PO TABS
650.0000 mg | ORAL_TABLET | Freq: Two times a day (BID) | ORAL | Status: AC
  Administered 2021-03-26 – 2021-03-27 (×4): 650 mg via ORAL
  Filled 2021-03-26 (×4): qty 1

## 2021-03-26 MED ORDER — POTASSIUM CHLORIDE 20 MEQ PO PACK
40.0000 meq | PACK | Freq: Once | ORAL | Status: AC
  Administered 2021-03-26: 08:00:00 40 meq via ORAL
  Filled 2021-03-26: qty 2

## 2021-03-26 MED ORDER — POTASSIUM CHLORIDE 20 MEQ PO PACK
40.0000 meq | PACK | Freq: Once | ORAL | Status: AC
  Administered 2021-03-26: 05:00:00 40 meq via ORAL
  Filled 2021-03-26: qty 2

## 2021-03-26 NOTE — Progress Notes (Addendum)
Progress Note    Desiree Madden  TAV:[id number] DOB: September 11, 1951  DOA: 02/21/2021 PCP: [facility]      Brief Narrative:    Medical records reviewed and are as summarized below:  Desiree Madden is a 70 y.o. female with medical history significant for colonic bladder fistula, diverticulitis, hypertension, hypothyroidism, hyperlipidemia, ulcerative colitis, who presented to the hospital on 02/21/2021 because of abdominal pain.  She was admitted to the hospital for acute diverticulitis.  Hospital course was complicated by perforated diverticulitis and she underwent emergent sigmoid colectomy with end colostomy and Hartman's procedure on 03/01/2021.  She was transferred to the ICU postoperatively.  She developed septic shock requiring multiple vasopressors.  She remained intubated on mechanical ventilation postoperatively.  She could not be liberated from the ventilator.  She failed extubation on 03/06/2021 and was reintubated.  ENT was consulted and tracheostomy was performed on 03/08/2021.      Assessment/Plan:   Principal Problem:   Diverticulitis Active Problems:   Fistula, bladder   Ulcerative colitis ([place])   Hyperlipidemia   Essential hypertension   Hypothyroidism   Abnormal ECG   CKD (chronic kidney disease), stage III (HCC)   Elevated lactic acid level   Protein-calorie malnutrition, severe   Nutrition Problem: Severe Malnutrition Etiology: acute illness  Signs/Symptoms: moderate fat depletion,moderate muscle depletion,severe muscle depletion   Body mass index is 24.62 kg/m.     Septic shock, hemorrhagic shock secondary to complicated diverticulitis with perforation, pelvic abscess, gangrene of the sigmoid colon: Shock physiology resolved.  S/p sigmoid colectomy with end colostomy, Hartman's procedure on 03/01/2021.  Completed antibiotics on 03/15/2021.  She has a wound VAC in place.  Continue local wound care.  Follow-up with general surgeon for further  recommendations.   Dysphagia, Severe malnutrition: Continue dysphagia 1 diet and nectar thick liquids.  Acute on chronic hypoxic respiratory failure: Failure to wean from ventilator requiring tracheostomy.  She is tolerating 3 L/min oxygen via trach collar.  Continue oxygen via trach collar.  Hypokalemia and hypomagnesemia: Replete potassium and magnesium.  Monitor levels  Non-anion gap metabolic acidosis: Start sodium bicarbonate.  SVT, sinus tachycardia, atrial fibrillation: Continue amiodarone and Eliquis.  NSTEMI, ischemic cardiomyopathy, chronic systolic and diastolic CHF: Continue Toprol-XL  2D echo showed EF estimated at 20 to 25% and grade 1 diastolic dysfunction.  Acute urinary retention: Urologist recommended keeping Foley catheter for 1 to 2 weeks from 03/16/2021.  Cystogram is recommended prior to Foley catheter removal.  I sent a message via secure chart to Dr. Bernardo Heater, urologist, for cystogram prior to removal of Foley cath.  Plan discussed with the daughter, Desiree Madden, at the bedside      Diet Order            DIET - DYS 1 Room service appropriate? Yes with Assist; Fluid consistency: Nectar Thick  Diet effective now                    Consultants:  General surgeon  [place]  ENT surgeon  Urologist  Procedures:  Tracheostomy on 03/08/2021  Sigmoid colectomy with colostomy and Hartman's procedure  Cystorrhaphy by urologist on 03/02/2019 for colovesical fistula and diverticulitis/pelvic abscess    Medications:   . amiodarone  200 mg Oral BID  . apixaban  5 mg Oral BID  . vitamin C  500 mg Oral BID  . chlorhexidine gluconate (MEDLINE KIT)  15 mL Mouth Rinse BID  . Chlorhexidine Gluconate Cloth  6 each Topical Daily  .  docusate  100 mg Oral BID  . feeding supplement (NEPRO CARB STEADY)  237 mL Oral TID BM  . insulin aspart  0-5 Units Subcutaneous QHS  . insulin aspart  0-9 Units Subcutaneous TID WC  . levothyroxine  75 mcg Oral Q0600  . mouth  rinse  15 mL Mouth Rinse 10 times per day  . metoprolol succinate  12.5 mg Oral Daily  . multivitamin with minerals  1 tablet Oral Daily  . pantoprazole  40 mg Oral Daily  . polyethylene glycol  17 g Oral Daily  . sodium chloride flush  10-40 mL Intracatheter Q12H  . sodium chloride flush  5 mL Intracatheter Q8H   Continuous Infusions: . sodium chloride Stopped (03/20/21 1026)     Anti-infectives (From admission, onward)   Start     Dose/Rate Route Frequency Ordered Stop   03/13/21 1400  meropenem (MERREM) 1 g in sodium chloride 0.9 % 100 mL IVPB        1 g 200 mL/hr over 30 Minutes Intravenous Every 8 hours 03/13/21 0855 03/16/21 2203   03/12/21 1800  meropenem (MERREM) 1 g in sodium chloride 0.9 % 100 mL IVPB  Status:  Discontinued        1 g 200 mL/hr over 30 Minutes Intravenous Every 12 hours 03/12/21 0840 03/13/21 0855   03/10/21 1000  meropenem (MERREM) 1 g in sodium chloride 0.9 % 100 mL IVPB  Status:  Discontinued        1 g 200 mL/hr over 30 Minutes Intravenous Every 8 hours 03/10/21 0854 03/12/21 0840   03/09/21 0600  fluconazole (DIFLUCAN) IVPB 200 mg  Status:  Discontinued        200 mg 100 mL/hr over 60 Minutes Intravenous Every 24 hours 03/08/21 0751 03/14/21 1014   03/08/21 2200  meropenem (MERREM) 1 g in sodium chloride 0.9 % 100 mL IVPB  Status:  Discontinued        1 g 200 mL/hr over 30 Minutes Intravenous Every 12 hours 03/08/21 0748 03/10/21 0854   03/08/21 0600  fluconazole (DIFLUCAN) IVPB 400 mg  Status:  Discontinued        400 mg 100 mL/hr over 120 Minutes Intravenous Every 24 hours 03/07/21 1321 03/08/21 0839   03/07/21 1100  meropenem (MERREM) 1 g in sodium chloride 0.9 % 100 mL IVPB  Status:  Discontinued        1 g 200 mL/hr over 30 Minutes Intravenous Every 8 hours 03/07/21 1013 03/08/21 0748   03/07/21 1000  meropenem (MERREM) 1 g in sodium chloride 0.9 % 100 mL IVPB  Status:  Discontinued        1 g 200 mL/hr over 30 Minutes Intravenous Every 12  hours 03/06/21 2243 03/07/21 1013   03/07/21 0600  fluconazole (DIFLUCAN) IVPB 400 mg        400 mg 100 mL/hr over 120 Minutes Intravenous  Once 03/06/21 2243 03/07/21 0701   03/04/21 1800  vancomycin (VANCOREADY) IVPB 750 mg/150 mL        750 mg 150 mL/hr over 60 Minutes Intravenous Every 24 hours 03/03/21 1442 03/06/21 2040   03/03/21 0800  vancomycin (VANCOREADY) IVPB 1500 mg/300 mL  Status:  Discontinued        1,500 mg 150 mL/hr over 120 Minutes Intravenous Every 48 hours 03/02/21 1410 03/03/21 1442   03/02/21 0800  vancomycin (VANCOREADY) IVPB 750 mg/150 mL  Status:  Discontinued        750 mg 150 mL/hr  over 60 Minutes Intravenous Every 24 hours 03/01/21 1954 03/02/21 1409   03/01/21 2200  ceFEPIme (MAXIPIME) 2 g in sodium chloride 0.9 % 100 mL IVPB        2 g 200 mL/hr over 30 Minutes Intravenous Every 12 hours 03/01/21 1903 03/06/21 2203   03/01/21 2100  vancomycin (VANCOREADY) IVPB 1250 mg/250 mL        1,250 mg 166.7 mL/hr over 90 Minutes Intravenous  Once 03/01/21 1954 03/02/21 0008   03/01/21 2000  metroNIDAZOLE (FLAGYL) IVPB 500 mg        500 mg 100 mL/hr over 60 Minutes Intravenous Every 8 hours 03/01/21 1903 03/06/21 2002   02/28/21 1800  piperacillin-tazobactam (ZOSYN) IVPB 3.375 g  Status:  Discontinued        3.375 g 12.5 mL/hr over 240 Minutes Intravenous Every 8 hours 02/28/21 1643 03/01/21 1903   02/28/21 1730  piperacillin-tazobactam (ZOSYN) IVPB 3.375 g  Status:  Discontinued        3.375 g 100 mL/hr over 30 Minutes Intravenous Every 8 hours 02/28/21 1635 02/28/21 1642   02/28/21 1300  Ampicillin-Sulbactam (UNASYN) 3 g in sodium chloride 0.9 % 100 mL IVPB  Status:  Discontinued        3 g 200 mL/hr over 30 Minutes Intravenous Every 6 hours 02/28/21 1156 02/28/21 1635   02/23/21 1230  piperacillin-tazobactam (ZOSYN) IVPB 3.375 g  Status:  Discontinued        3.375 g 12.5 mL/hr over 240 Minutes Intravenous Every 8 hours 02/23/21 1143 02/28/21 1155   02/22/21  0600  ciprofloxacin (CIPRO) IVPB 400 mg  Status:  Discontinued        400 mg 200 mL/hr over 60 Minutes Intravenous Every 12 hours 02/21/21 2028 02/23/21 1143   02/22/21 0400  metroNIDAZOLE (FLAGYL) IVPB 500 mg  Status:  Discontinued        500 mg 100 mL/hr over 60 Minutes Intravenous Every 8 hours 02/21/21 2028 02/23/21 1143   02/21/21 1830  ciprofloxacin (CIPRO) IVPB 400 mg        400 mg 200 mL/hr over 60 Minutes Intravenous  Once 02/21/21 1820 02/21/21 1939   02/21/21 1830  metroNIDAZOLE (FLAGYL) IVPB 500 mg        500 mg 100 mL/hr over 60 Minutes Intravenous  Once 02/21/21 1820 02/21/21 2110             Family Communication/Anticipated D/C date and plan/Code Status   DVT prophylaxis: SCDs Start: 02/21/21 2200     Code Status: DNR  Family Communication: None. Disposition Plan:    Status is: Inpatient  Remains inpatient appropriate because:Inpatient level of care appropriate due to severity of illness   Dispo: The patient is from: Home              Anticipated d/c is to: SNF              Patient currently is not medically stable to d/c.   Difficult to place patient No           Subjective:   She has no complaints.  She feels much better.  She is feeding herself.  Her daughter was at the bedside.  Objective:    Vitals:   03/26/21 0801 03/26/21 0811 03/26/21 0834 03/26/21 1206  BP: 118/80 118/80  108/65  Pulse: 79 79  83  Resp: 16 16  20   Temp:  98.1 F (36.7 C)  98.8 F (37.1 C)  TempSrc:  SpO2: 100% 100% 100% 100%  Weight:      Height:       No data found.   Intake/Output Summary (Last 24 hours) at 03/26/2021 1320 Last data filed at 03/26/2021 0824 Gross per 24 hour  Intake 0 ml  Output 250 ml  Net -250 ml   Filed Weights   03/24/21 0500 03/25/21 0500 03/26/21 0500  Weight: 55.7 kg 53.4 kg 65.1 kg    Exam:  GEN: NAD SKIN: Warm and dry EYES: No pallor or icterus ENT: MMM, + tracheostomy CV: RRR PULM: CTA B ABD: soft, ND,  NT, +BS, + wound VAC, + colostomy CNS: AAO x 3, non focal EXT: No edema or tenderness         Data Reviewed:   I have personally reviewed following labs and imaging studies:  Labs: Labs show the following:   Basic Metabolic Panel: Recent Labs  Lab 03/21/21 0318 03/22/21 0616 03/23/21 0440 03/24/21 0436 03/26/21 0321 03/26/21 0500  NA 139 141 140 142 140  --   K 3.7 4.8 3.6 3.6 2.5*  --   CL 110 111 110 110 116*  --   CO2 22 23 22 24  18*  --   GLUCOSE 131* 136* 97 87 82  --   BUN 60* 59* 52* 46* 26*  --   CREATININE 0.88 0.90 0.81 0.97 0.68  --   CALCIUM 8.6* 8.6* 8.9 8.8* 6.5*  --   MG 1.7 2.3 2.0 2.1  --  1.4*  PHOS 3.7 3.6 3.9 5.0* 3.2  --    GFR Estimated Creatinine Clearance: 56.5 mL/min (by C-G formula based on SCr of 0.68 mg/dL). Liver Function Tests: No results for input(s): AST, ALT, ALKPHOS, BILITOT, PROT, ALBUMIN in the last 168 hours. No results for input(s): LIPASE, AMYLASE in the last 168 hours. No results for input(s): AMMONIA in the last 168 hours. Coagulation profile No results for input(s): INR, PROTIME in the last 168 hours.  CBC: Recent Labs  Lab 03/20/21 0530 03/21/21 0318 03/22/21 0616 03/23/21 0440 03/26/21 0321  WBC 7.5 8.3 7.5 6.2 6.2  HGB 8.8* 8.8* 9.6* 8.7* 9.6*  HCT 27.4* 27.3* 29.6* 26.7* 29.6*  MCV 96.1 94.8 95.8 95.0 93.7  PLT 174 177 204 253 381   Cardiac Enzymes: No results for input(s): CKTOTAL, CKMB, CKMBINDEX, TROPONINI in the last 168 hours. BNP (last 3 results) No results for input(s): PROBNP in the last 8760 hours. CBG: Recent Labs  Lab 03/25/21 1207 03/25/21 1642 03/25/21 2157 03/26/21 0815 03/26/21 1208  GLUCAP 157* 137* 121* 89 140*   D-Dimer: No results for input(s): DDIMER in the last 72 hours. Hgb A1c: No results for input(s): HGBA1C in the last 72 hours. Lipid Profile: No results for input(s): CHOL, HDL, LDLCALC, TRIG, CHOLHDL, LDLDIRECT in the last 72 hours. Thyroid function studies: No  results for input(s): TSH, T4TOTAL, T3FREE, THYROIDAB in the last 72 hours.  Invalid input(s): FREET3 Anemia work up: No results for input(s): VITAMINB12, FOLATE, FERRITIN, TIBC, IRON, RETICCTPCT in the last 72 hours. Sepsis Labs: Recent Labs  Lab 03/21/21 0318 03/22/21 0616 03/23/21 0440 03/26/21 0321  WBC 8.3 7.5 6.2 6.2    Microbiology Recent Results (from the past 240 hour(s))  Resp Panel by RT-PCR (Flu A&B, Covid) Nasopharyngeal Swab     Status: None   Collection Time: 03/26/21 10:29 AM   Specimen: Nasopharyngeal Swab; Nasopharyngeal(NP) swabs in vial transport medium  Result Value Ref Range Status   SARS Coronavirus 2 by  RT PCR NEGATIVE NEGATIVE Final    Comment: (NOTE) SARS-CoV-2 target nucleic acids are NOT DETECTED.  The SARS-CoV-2 RNA is generally detectable in upper respiratory specimens during the acute phase of infection. The lowest concentration of SARS-CoV-2 viral copies this assay can detect is 138 copies/mL. A negative result does not preclude SARS-Cov-2 infection and should not be used as the sole basis for treatment or other patient management decisions. A negative result may occur with  improper specimen collection/handling, submission of specimen other than nasopharyngeal swab, presence of viral mutation(s) within the areas targeted by this assay, and inadequate number of viral copies(<138 copies/mL). A negative result must be combined with clinical observations, patient history, and epidemiological information. The expected result is Negative.  Fact Sheet for Patients:  EntrepreneurPulse.com.au  Fact Sheet for Healthcare Providers:  IncredibleEmployment.be  This test is no t yet approved or cleared by the [place] FDA and  has been authorized for detection and/or diagnosis of SARS-CoV-2 by FDA under an Emergency Use Authorization (EUA). This EUA will remain  in effect (meaning this test can be used) for the  duration of the COVID-19 declaration under Section 564(b)(1) of the Act, 21 U.S.C.section 360bbb-3(b)(1), unless the authorization is terminated  or revoked sooner.       Influenza A by PCR NEGATIVE NEGATIVE Final   Influenza B by PCR NEGATIVE NEGATIVE Final    Comment: (NOTE) The Xpert Xpress SARS-CoV-2/FLU/RSV plus assay is intended as an aid in the diagnosis of influenza from Nasopharyngeal swab specimens and should not be used as a sole basis for treatment. Nasal washings and aspirates are unacceptable for Xpert Xpress SARS-CoV-2/FLU/RSV testing.  Fact Sheet for Patients: EntrepreneurPulse.com.au  Fact Sheet for Healthcare Providers: IncredibleEmployment.be  This test is not yet approved or cleared by the [place] FDA and has been authorized for detection and/or diagnosis of SARS-CoV-2 by FDA under an Emergency Use Authorization (EUA). This EUA will remain in effect (meaning this test can be used) for the duration of the COVID-19 declaration under Section 564(b)(1) of the Act, 21 U.S.C. section 360bbb-3(b)(1), unless the authorization is terminated or revoked.  Performed at [facility], [address] [place], [place] [address]     Procedures and diagnostic studies:  No results found.             LOS: 33 days   [employer] Copywriter, advertising on [email or web address]. If 7PM-7AM, please contact night-coverage at [email or web address]     03/26/2021, 1:20 PM

## 2021-03-26 NOTE — Consult Note (Signed)
[place] Nurse wound follow up Vac dressing change performed. Wound type:Abd with full thickness post-op wound to midline;has evolved into 2 separate wounds to upper and lower abd with narrow bridge of skin in between. Beefyred, small amtpinkdrainage,no odor Ptwas medicated for pain prior to the procedure and tolerated with minimal amt discomfort.Applied barrier ring to creases surrounding wound at 3:00 o'clockand 9:00 o'clockandalso5:00 o'clock to 7:00 o'clock, then one piece black foamto 110mm cont suction. Wound is located in close proximity to the ostomy andmaybe difficult to maintain a seal. WOC team will change dressing Q M/W/F.  WOC Nurse ostomy follow up Stoma type/location:LMQ colostomy Stomal assessment/size:1 and1/2inches round, red, raised above skin level. There is a crease at 9:00 o'clock to the stoma and another deep crease below stoma; applied barrier ring to attempt to maintain a seal. Output:mod amt liquid brown stool Ostomy pouching: Applied 2pc.2 and 3/4 inch pouching system with skin barrier ring Nils Pyle # [phone], wafer #2, pouch # [id number]) supplies at the bedside for staff nurse use. Education provided:No family present; progress notes indicate pt will discharge to SNF and have total assistance with pouching activities. Enrolled patient in [place] Start Discharge program: Not yet Julien Girt MSN, RN, CWOCN, [employer], CNS

## 2021-03-26 NOTE — Progress Notes (Signed)
Speech Language Pathology Treatment: Dysphagia  Patient Details Name: Desiree Madden MRN: [id number] DOB: [birth year] Today's Date: 03/26/2021 Time: 4098-1191 SLP Time Calculation (min) (ACUTE ONLY): 50 min  Assessment / Plan / Recommendation Clinical Impression  Pt seen forongoingassessment of PMVtolerancefor verbal communication, and for toleration of newly upgraded oral diet -- dysphagia level 1 w/ Nectar liquids post MBSS on 03/22/2021. Pt has been demonstrating improvement w/ tolerance of wearof PMV this week, and appropriate vocal quality for verbal communicationwhen wearing the PMV. Pt's Alertness is also much improvedthough she needs cues for engagement during sessions. Note some degree of Cognitive decline in her limited communication responses (w/out detail) and need for Mod cues for follow through w/ tasks. Other Rehab disciplines often find her drowsy during the day. Tracheostomywasplaced on 03/08/2021 after ~2 weeks of oral intubation and failing vent wean trials. Pt appears to be weaning from [place]; PMV placed w/ Dtr present in room. Pt has NG tube for nutritional needs currently. Pt has a [place] size 6 tube Cuffed,but Cuff is fully deflated at Baseline.  Pt presented w/Alertness and engaged w/ this [place] entering room.Oral care given w/ appropriate lingual/labial responses. PMV was already in place. Pt responded to basic questions from both Daughter and SLP w/ adequate vocal quality w/ no increased RR effort/rate noted. Pt denied any distress while wearing the PMV. Cognitive decline noted in pt's simple responses which lacked detail; also noted pt was often drowsy and only engaged verbally when spoken to. SLP moved to [employer] giving instructions for Dtr to monitor and give cues to pt for 1 ice chip at a time during self-feeding. SLP put only 3-4 chips in the cup at a time also. Education and hands-on training given to both pt and Daughter -- pt fed  self single Ice Chips during session w/ just verbal cues as reminders for single chips. Oral phase management was appropriate and timely; no overt clinical s/s of aspiration noted w/ the intake of Ice Chips. Suspect pt may be ready for trials of Minced foods in her diet. Ensured PMV and aspiration precautions Signs were posted in pt's room.  Discussed PMV use/wear/care precautions as well as aspiration precautions during any oral intake. Dtr asked good questions. Recommendfrequent oral care for hygiene and stimulation of swallowing.Recommend PRN wear/use of PMV for verbal communication w/ others and w/ ALL oral intake w/ 100% Supervision and following precautions as posted above bed in room. PMV wear will aid in Pulmonary status improvement and in engagement and communication w/ other to support Cognitive/mental status improvement and awareness. Encouraged Dtr to call and talk to pt via phone having Desiree Madden place and monitor the PMV. Recommend [place] PRN Single ice chips while wearing the PMV(MUST WEAR PMV FOR ORAL INTAKE) following aspiration precautions.  Recommendcontinue w/ a Dysphagia level 1 (puree) diet w/ Nectar consistency liquids Via Cup; general aspiration precautions. Support at meals to encourage oral intake, feeding when needed. Reduce distractions at meals. Pt MUST WEAR THE PMV W/ ALL ORAL INTAKE. Recommend Dietician f/u for support.Daughter agreed w/ Desiree Madden phone call. NSG/MDupdated.   HPI HPI: Pt is a 70 y.o. female who admitted on 02/21/2021 w/ Diverticulitis with Colovesical fistula and UTI, now 12 Days Post-Op s/p Hartman's Procedure for complicated diverticulitis with gangrenous changes and perforation with feculent peritonitis and 4 days s/p colostomy revision.  Pt remained on vent failing wean; Tracheostomy placed on 03/08/2021.  Pt is now on Trach Collar O2 support weaning from full vent support  on 03/15/2021.  Ongoing Wound Care.  MBSS completed on 03/22/2021.      SLP Plan  Continue  with current plan of care       Recommendations  Diet recommendations: Dysphagia 1 (puree);Nectar-thick liquid (trials to upgrade foods Tuesday) Liquids provided via: Cup;No straw Medication Administration: Crushed with puree Supervision: Patient able to self feed;Staff to assist with self feeding;Full supervision/cueing for compensatory strategies Compensations: Minimize environmental distractions;Slow rate;Small sips/bites;Lingual sweep for clearance of pocketing;Multiple dry swallows after each bite/sip;Follow solids with liquid Postural Changes and/or Swallow Maneuvers: Out of bed for meals;Seated upright 90 degrees;Upright 30-60 min after meal      Patient may use Passy-Muir Speech Valve: Intermittently with supervision;During all therapies with supervision;During all waking hours (remove during sleep);During PO intake/meals PMSV Supervision: Full MD: Please consider changing trach tube to : Cuffless         General recommendations:  (Dietician f/u) Oral Care Recommendations: Oral care BID;Oral care before and after PO;Staff/trained caregiver to provide oral care Follow up Recommendations: Skilled Nursing facility SLP Visit Diagnosis: Dysphagia, oropharyngeal phase (R13.12);Aphonia (R49.1) Plan: Continue with current plan of care       GO                  [name], MS, CCC-SLP Speech Language Pathologist Rehab Services [phone] [facility] 03/26/2021, 5:35 PM

## 2021-03-26 NOTE — Plan of Care (Signed)
End of shift summary:  Alert and oriented x3. VSS. Oxygen sats >95% on 5L 28% trach collar. Suctioned x1. Xanax x1 for anxiety. PRN pain medications as needed. Denies n/v. Ankle foot orthotic device remained in place to LLE. K+ 2.5, see progress note. Mag add on ordered to AM labs. Wound vac maintained to suction at 11mmHg. Ostomy wafer and appliance changed, (+) stool (+) flatus. Q2T as tolerated by pt. Remained free from falls or injury. Call bell within reach and able to use.   Problem: Education: Goal: Knowledge of General Education information will improve Description: Including pain rating scale, medication(s)/side effects and non-pharmacologic comfort measures Outcome: Progressing   Problem: Health Behavior/Discharge Planning: Goal: Ability to manage health-related needs will improve Outcome: Progressing   Problem: Clinical Measurements: Goal: Ability to maintain clinical measurements within normal limits will improve Outcome: Progressing Goal: Will remain free from infection Outcome: Progressing Goal: Diagnostic test results will improve Outcome: Progressing Goal: Respiratory complications will improve Outcome: Progressing Goal: Cardiovascular complication will be avoided Outcome: Progressing   Problem: Activity: Goal: Risk for activity intolerance will decrease Outcome: Progressing   Problem: Nutrition: Goal: Adequate nutrition will be maintained Outcome: Progressing   Problem: Coping: Goal: Level of anxiety will decrease Outcome: Progressing   Problem: Elimination: Goal: Will not experience complications related to bowel motility Outcome: Progressing Goal: Will not experience complications related to urinary retention Outcome: Progressing   Problem: Pain Managment: Goal: General experience of comfort will improve Outcome: Progressing   Problem: Safety: Goal: Ability to remain free from injury will improve Outcome: Progressing   Problem: Skin  Integrity: Goal: Risk for impaired skin integrity will decrease Outcome: Progressing   Problem: Health Behavior/Discharge Planning: Goal: Ability to manage tracheostomy will improve Outcome: Progressing

## 2021-03-26 NOTE — Progress Notes (Signed)
   03/26/21 0400  Provider Notification  Provider Name/Title Mansy MD  Date Provider Notified 03/26/21  Time Provider Notified 0400  Notification Type Page  Notification Reason Critical result  Test performed and critical result K+ 2.5  Date Critical Result Received 03/26/21  Time Critical Result Received 0357  Provider response See new orders  Date of Provider Response 03/26/21  Time of Provider Response 0400

## 2021-03-26 NOTE — Progress Notes (Signed)
[place] for Electrolyte Monitoring and Replacement   Recent Labs: Potassium (mmol/L)  Date Value  03/26/2021 2.5 (LL)   Magnesium (mg/dL)  Date Value  03/26/2021 1.4 (L)   Calcium (mg/dL)  Date Value  03/26/2021 6.5 (L)   Albumin (g/dL)  Date Value  03/16/2021 2.4 (L)   Phosphorus (mg/dL)  Date Value  03/26/2021 3.2   Sodium (mmol/L)  Date Value  03/26/2021 140   Assessment: Desiree Madden is a 70 y.o. female admitted on 02/21/2021 with intra-abdominal infection (diverticulitis w/ fecal perforation). Patient septic with acute diverticulitis abdominal abscess with colovesical fistula and enterococcus urinary tract infection which was further complicated by the development of complicated diverticulitis with perforation and gangrenous changes requiring Hartman's procedure performed on 03/01/21. PMH includes CAD s/p CABG (2015), HTN, hypothyroidism, colon bladder fistula, diverticulitis, and ulcerative colitis. Pharmacy was consulted for electrolyte monitoring.  Nutrition: Tube feeds stopped. SLP has cleared patient for dysphagia 1 diet  Goal of Therapy:  K 4.0 - 5.1 mmol/L Mg 2.0 - 2.4 mg/dL All Other Electrolytes WNL  Plan:   K 2.5 - MD repleting with KCl 7mEq PO x 2  Mg 1.4 - MD repleting with Mag Sulfate 2g IV x 1  Re-check electrolytes with AM labs  Paulina Fusi, PharmD, BCPS 03/26/2021 1:51 PM

## 2021-03-26 NOTE — Progress Notes (Signed)
PT Cancellation Note  Patient Details Name: Desiree Madden MRN: [id number] DOB: 1951-01-26   Cancelled Treatment:    Reason Eval/Treat Not Completed: Other (comment). Pt with most recent K+ at 2.5; outside of normative values, therefore contraindicated for exertional activity. Will re-attempt another time when medically stable.   Emeric Novinger 03/26/2021, 10:26 AM  Greggory Stallion, PT, DPT [phone]

## 2021-03-27 ENCOUNTER — Inpatient Hospital Stay: Payer: Medicare HMO

## 2021-03-27 DIAGNOSIS — E43 Unspecified severe protein-calorie malnutrition: Secondary | ICD-10-CM | POA: Diagnosis not present

## 2021-03-27 DIAGNOSIS — K5792 Diverticulitis of intestine, part unspecified, without perforation or abscess without bleeding: Secondary | ICD-10-CM | POA: Diagnosis not present

## 2021-03-27 LAB — BASIC METABOLIC PANEL
Anion gap: 9 (ref 5–15)
BUN: 37 mg/dL — ABNORMAL HIGH (ref 8–23)
CO2: 24 mmol/L (ref 22–32)
Calcium: 8.9 mg/dL (ref 8.9–10.3)
Chloride: 105 mmol/L (ref 98–111)
Creatinine, Ser: 1.05 mg/dL — ABNORMAL HIGH (ref 0.44–1.00)
GFR, Estimated: 57 mL/min — ABNORMAL LOW (ref >60)
Glucose, Bld: 104 mg/dL — ABNORMAL HIGH (ref 70–99)
Potassium: 4.2 mmol/L (ref 3.5–5.1)
Sodium: 138 mmol/L (ref 135–145)

## 2021-03-27 LAB — PHOSPHORUS: Phosphorus: 4.2 mg/dL (ref 2.5–4.6)

## 2021-03-27 LAB — GLUCOSE, CAPILLARY
Glucose-Capillary: 92 mg/dL (ref 70–99)
Glucose-Capillary: 111 mg/dL — ABNORMAL HIGH (ref 70–99)
Glucose-Capillary: 121 mg/dL — ABNORMAL HIGH (ref 70–99)
Glucose-Capillary: 116 mg/dL — ABNORMAL HIGH (ref 70–99)

## 2021-03-27 LAB — MAGNESIUM: Magnesium: 2.2 mg/dL (ref 1.7–2.4)

## 2021-03-27 MED ORDER — AMOXICILLIN 500 MG PO CAPS
500.0000 mg | ORAL_CAPSULE | Freq: Three times a day (TID) | ORAL | Status: AC
  Administered 2021-03-27 – 2021-03-29 (×6): 500 mg via ORAL
  Filled 2021-03-27 (×6): qty 1

## 2021-03-27 MED ORDER — IOTHALAMATE MEGLUMINE 17.2 % UR SOLN
250.0000 mL | Freq: Once | URETHRAL | Status: AC | PRN
  Administered 2021-03-27: 250 mL via INTRAVESICAL

## 2021-03-27 NOTE — Progress Notes (Signed)
Physical Therapy Treatment Patient Details Name: Desiree Madden MRN: [id number] DOB: Apr 09, 1951 Today's Date: 03/27/2021    History of Present Illness Desiree Madden is a 70 y.o. female with medical history significant for colonic bladder fistula, diverticulitis, hypertension, hypothyroidism, hyperlipidemia, ulcerative colitis, who presented to the hospital on 02/21/2021 because of abdominal pain.  She was admitted to the hospital for acute diverticulitis.  Hospital course was complicated by perforated diverticulitis and she underwent emergent sigmoid colectomy with end colostomy and Hartman's procedure on 03/01/2021.  She was transferred to the ICU postoperatively.  She developed septic shock requiring multiple vasopressors.  She remained intubated on mechanical ventilation postoperatively.  She could not be liberated from the ventilator.  She failed extubation on 03/06/2021 and was reintubated.  ENT was consulted and tracheostomy was performed on 03/08/2021.   Ultimately extubated 4/8.    PT Comments    Pt was long sitting in bed with supportive daughter at bedside. Sao2 99% with PMV donned. Most of this session focused on education of pt/pt's daughter on exercises/stretching to be performed throughout the day to promote return in strength. Pt is Alert however does have cognition deficits. She was able to follow one step commands consistently throughout session. Demonstrated active strength in BLEs however more in RLE than LLE. Was able to supine > short sit EOB with max assist of one. Once seated EOB was able to maintain balance with supervision. Pt fatigued quickly with there ex and EOB sitting. She requested not getting OOB to recliner at this time. Total assist to return to supine once fatigued. Therapist recommend pt get OOB daily via mechanical lift. She will need extensive PT going forward to have any chance of returning to PLOF. Pt lives at home with spouse. Daughter states she lives on same  property and is able to assist pt 24/7 at [place]. Recommend CIR consult. Acute PT will continue to follow and progress as able per POC.    Follow Up Recommendations  CIR;SNF;Supervision/Assistance - 24 hour;Supervision for mobility/OOB     Equipment Recommendations  None recommended by PT    Recommendations for Other Services Rehab consult     Precautions / Restrictions Precautions Precautions: Fall Precaution Comments: wound vac/trach/PMV Restrictions Weight Bearing Restrictions: No    Mobility  Bed Mobility Overal bed mobility: Needs Assistance Bed Mobility: Sidelying to Sit;Supine to Sit;Sit to Supine;Sit to Sidelying Rolling: Max assist Sidelying to sit: Max assist Supine to sit: Max assist Sit to supine: Total assist   General bed mobility comments: Pt was able to roll R to short sit with max assist. once fatigued from EOB activity, required total assist to return to supine.    Transfers      General transfer comment: Pt endorses being too fatigued to attempt standing. Did perform alot of ther ex and stretching in bed prior to sitting EOB. recommend OOB via mechnical lift as often as possible.      Balance Overall balance assessment: Needs assistance Sitting-balance support: Feet supported Sitting balance-Leahy Scale: Fair Sitting balance - Comments: pt sat EOB x ~ 5 minutes with supervision only. Did endorse fatigue       Cognition Arousal/Alertness: Awake/alert Behavior During Therapy: WFL for tasks assessed/performed Overall Cognitive Status: Impaired/Different from baseline Area of Impairment: Orientation;Following commands;Safety/judgement;Problem solving;Awareness      Orientation Level: Disoriented to;Time;Situation     Following Commands: Follows one step commands with increased time;Follows one step commands consistently;Follows multi-step commands inconsistently Safety/Judgement: Decreased awareness of safety;Decreased awareness of  deficits  Awareness: Intellectual Problem Solving: Difficulty sequencing;Requires verbal cues;Requires tactile cues General Comments: Pt is wike awake and conversational throughout session. She was able to correctly state who she was and where she was however unable to state correct time/situation. Throughout session, pt demonstrates poor insight to deficits however able to perform increased functional task with less cueing.         General Comments General comments (skin integrity, edema, etc.): Most of session spent educating pt/daughter on positioning, strengthing exercises, stretching, and overall need for increasing physical activity throughout the day.      Pertinent Vitals/Pain Pain Assessment: No/denies pain           PT Goals (current goals can now be found in the care plan section) Acute Rehab PT Goals Patient Stated Goal: "home eventually." Progress towards PT goals: Progressing toward goals    Frequency    Min 2X/week      PT Plan Discharge plan needs to be updated    Co-evaluation     PT goals addressed during session: Mobility/safety with mobility;Balance;Proper use of DME;Strengthening/ROM;Other (comment) (daughter training)        AM-PAC PT "6 Clicks" Mobility   Outcome Measure  Help needed turning from your back to your side while in a flat bed without using bedrails?: A Lot Help needed moving from lying on your back to sitting on the side of a flat bed without using bedrails?: Total Help needed moving to and from a bed to a chair (including a wheelchair)?: Total Help needed standing up from a chair using your arms (e.g., wheelchair or bedside chair)?: Total Help needed to walk in hospital room?: Total Help needed climbing 3-5 steps with a railing? : Total 6 Click Score: 7    End of Session   Activity Tolerance: Patient tolerated treatment well;Patient limited by fatigue Patient left: in bed;with call bell/phone within reach;with bed alarm  set;with family/visitor present (PRAFO boot donned to LLE. More deficits in LLE than RLE.) Nurse Communication: Mobility status PT Visit Diagnosis: Muscle weakness (generalized) (M62.81);Unsteadiness on feet (R26.81);Other abnormalities of gait and mobility (R26.89)     Time: 9323-5573 PT Time Calculation (min) (ACUTE ONLY): 38 min  Charges:  $Therapeutic Activity: 8-22 mins $Neuromuscular Re-education: 23-37 mins                     Julaine Fusi PTA 03/27/21, 10:59 AM

## 2021-03-27 NOTE — Progress Notes (Signed)
   03/27/21 1300  Clinical Encounter Type  Visited With Patient  Visit Type Spiritual support;Social support  Referral From Nurse  Consult/Referral To Chaplain  Spiritual Encounters  Spiritual Needs Prayer;Emotional  Stress Factors  Family Stress Factors Health changes;Loss of control;Major life changes   Chaplain responded to a page from the nurse that PT could benefit from a Chaplain visit. PT stated she has gone through multiple health challenges. She spoke of her faith in [place], but her fear, and concerns about complaining. Chaplain supported PT in her feelings, and let her know its ok to question, "Why". Chaplain ministered with a calm presence, and prayer.

## 2021-03-27 NOTE — Progress Notes (Signed)
Inpatient Rehabilitation Admissions Coordinator   I will place order per protocol for rehab consult.  Danne Baxter, RN, MSN Rehab Admissions Coordinator [phone] 03/27/2021 8:07 PM

## 2021-03-27 NOTE — NC FL2 (Signed)
[place] LEVEL OF CARE SCREENING TOOL     IDENTIFICATION  Patient Name: Desiree Madden Birthdate: 1951/06/03 Sex: female Admission Date (Current Location): 02/21/2021  [facility] and [place] Number:  Engineering geologist and Address:  [facility], [address] [place], [place] [address]      Provider Number: [id number]  Attending Physician Name and Address:  Jennye Boroughs, MD  Relative Name and Phone Number:       Current Level of Care: Hospital Recommended Level of Care: [place] Prior Approval Number:    Date Approved/Denied:   PASRR Number: Pending  Discharge Plan: SNF    Current Diagnoses: Patient Active Problem List   Diagnosis Date Noted  . Protein-calorie malnutrition, severe 03/13/2021  . Diverticulitis 02/21/2021  . Fistula, bladder 02/21/2021  . Ulcerative colitis ([place]) 02/21/2021  . Hyperlipidemia 02/21/2021  . Essential hypertension 02/21/2021  . Hypothyroidism 02/21/2021  . Abnormal ECG 02/21/2021  . CKD (chronic kidney disease), stage III ([place]) 02/21/2021  . Elevated lactic acid level 02/21/2021    Orientation RESPIRATION BLADDER Height & Weight     Self,Time,Situation,Place  Tracheostomy (5L 28% Trach collar) Continent Weight: 64 kg Height:  5' 4.02" (162.6 cm)  BEHAVIORAL SYMPTOMS/MOOD NEUROLOGICAL BOWEL NUTRITION STATUS      Colostomy Diet (Dysphagia 1)  AMBULATORY STATUS COMMUNICATION OF NEEDS Skin   Extensive Assist Verbally Surgical wounds (Abd wound-Wound vac in place)                       Personal Care Assistance Level of Assistance  Bathing,Feeding,Dressing Bathing Assistance: Limited assistance Feeding assistance: Maximum assistance Dressing Assistance: Maximum assistance     Functional Limitations Info             SPECIAL CARE FACTORS FREQUENCY  PT (By licensed PT),OT (By licensed OT)     PT Frequency: min 5 xweek OT Frequency: min 5xweek             Contractures      Additional Factors Info                  Current Medications (03/27/2021):  This is the current hospital active medication list Current Facility-Administered Medications  Medication Dose Route Frequency Provider Last Rate Last Admin  . 0.9 %  sodium chloride infusion  250 mL Intravenous Continuous Jennye Boroughs, MD   Stopped at 03/20/21 1026  . ALPRAZolam Duanne Moron) tablet 0.25 mg  0.25 mg Oral BID PRN Jennye Boroughs, MD   0.25 mg at 03/27/21 1231  . amiodarone (PACERONE) tablet 200 mg  200 mg Oral BID Jennye Boroughs, MD   200 mg at 03/27/21 1232  . amoxicillin (AMOXIL) capsule 500 mg  500 mg Oral Q8H Jennye Boroughs, MD      . apixaban Arne Cleveland) tablet 5 mg  5 mg Oral BID Jennye Boroughs, MD   5 mg at 03/27/21 1232  . ascorbic acid (VITAMIN C) tablet 500 mg  500 mg Oral BID Jennye Boroughs, MD   500 mg at 03/27/21 1231  . chlorhexidine gluconate (MEDLINE KIT) (PERIDEX) 0.12 % solution 15 mL  15 mL Mouth Rinse BID Jennye Boroughs, MD   15 mL at 03/27/21 1232  . Chlorhexidine Gluconate Cloth 2 % PADS 6 each  6 each Topical Daily Jennye Boroughs, MD   6 each at 03/27/21 0015  . docusate (COLACE) 50 MG/5ML liquid 100 mg  100 mg Oral BID Jennye Boroughs, MD   100  mg at 03/26/21 2026  . feeding supplement (NEPRO CARB STEADY) liquid 237 mL  237 mL Oral TID BM Jennye Boroughs, MD   237 mL at 03/27/21 1234  . HYDROmorphone (DILAUDID) injection 0.5 mg  0.5 mg Intravenous Q3H PRN Jennye Boroughs, MD   0.5 mg at 03/27/21 1230  . insulin aspart (novoLOG) injection 0-5 Units  0-5 Units Subcutaneous QHS Jennye Boroughs, MD      . insulin aspart (novoLOG) injection 0-9 Units  0-9 Units Subcutaneous TID WC Jennye Boroughs, MD   1 Units at 03/26/21 1808  . ipratropium-albuterol (DUONEB) 0.5-2.5 (3) MG/3ML nebulizer solution 3 mL  3 mL Nebulization Q4H PRN Jennye Boroughs, MD      . levothyroxine (SYNTHROID) tablet 75 mcg  75 mcg Oral Q0600 Jennye Boroughs, MD   75 mcg at 03/27/21 0517  . lip  balm (BLISTEX) ointment   Topical PRN Jennye Boroughs, MD   Given at 03/18/21 [phone]  . MEDLINE mouth rinse  15 mL Mouth Rinse 10 times per day Jennye Boroughs, MD   15 mL at 03/27/21 1500  . metoprolol succinate (TOPROL-XL) 24 hr tablet 12.5 mg  12.5 mg Oral Daily Jennye Boroughs, MD   12.5 mg at 03/27/21 1231  . multivitamin with minerals tablet 1 tablet  1 tablet Oral Daily Jennye Boroughs, MD   1 tablet at 03/27/21 1231  . ondansetron (ZOFRAN) tablet 4 mg  4 mg Oral Q6H PRN Jennye Boroughs, MD       Or  . ondansetron (ZOFRAN) injection 4 mg  4 mg Intravenous Q6H PRN Jennye Boroughs, MD      . oxyCODONE (Oxy IR/ROXICODONE) immediate release tablet 5 mg  5 mg Oral Q6H PRN Jennye Boroughs, MD   5 mg at 03/27/21 0651  . pantoprazole (PROTONIX) EC tablet 40 mg  40 mg Oral Daily Jennye Boroughs, MD   40 mg at 03/27/21 1231  . polyethylene glycol (MIRALAX / GLYCOLAX) packet 17 g  17 g Oral Daily Jennye Boroughs, MD   17 g at 03/27/21 1232  . sodium bicarbonate tablet 650 mg  650 mg Oral BID Jennye Boroughs, MD   650 mg at 03/27/21 1236  . sodium chloride flush (NS) 0.9 % injection 10-40 mL  10-40 mL Intracatheter Q12H Jennye Boroughs, MD   10 mL at 03/27/21 1236  . sodium chloride flush (NS) 0.9 % injection 10-40 mL  10-40 mL Intracatheter PRN Jennye Boroughs, MD         Discharge Medications: Please see discharge summary for a list of discharge medications.  Relevant Imaging Results:  Relevant Lab Results:   Additional Information SS# [id number]  Anselm Pancoast, RN

## 2021-03-27 NOTE — Progress Notes (Signed)
[place] for Electrolyte Monitoring and Replacement   Recent Labs: Potassium (mmol/L)  Date Value  03/27/2021 4.2   Magnesium (mg/dL)  Date Value  03/27/2021 2.2   Calcium (mg/dL)  Date Value  03/27/2021 8.9   Albumin (g/dL)  Date Value  03/16/2021 2.4 (L)   Phosphorus (mg/dL)  Date Value  03/27/2021 4.2   Sodium (mmol/L)  Date Value  03/27/2021 138   Assessment: Desiree Madden is a 70 y.o. female admitted on 02/21/2021 with intra-abdominal infection (diverticulitis w/ fecal perforation). Patient septic with acute diverticulitis abdominal abscess with colovesical fistula and enterococcus urinary tract infection which was further complicated by the development of complicated diverticulitis with perforation and gangrenous changes requiring Hartman's procedure performed on 03/01/21. PMH includes CAD s/p CABG (2015), HTN, hypothyroidism, colon bladder fistula, diverticulitis, and ulcerative colitis. Pharmacy was consulted for electrolyte monitoring.  Nutrition: Tube feeds stopped. SLP has cleared patient for dysphagia 1 diet  Goal of Therapy:  K 4.0 - 5.1 mmol/L Mg 2.0 - 2.4 mg/dL All Other Electrolytes WNL  Plan:   No additional supplementation needed  Re-check electrolytes with AM labs  Paulina Fusi, PharmD, BCPS 03/27/2021 2:30 PM

## 2021-03-27 NOTE — Progress Notes (Addendum)
Progress Note    Desiree Madden  GYJ:[id number] DOB: 09/04/1951  DOA: 02/21/2021 PCP: [facility]      Brief Narrative:    Medical records reviewed and are as summarized below:  Desiree Madden is a 70 y.o. female with medical history significant for colonic bladder fistula, diverticulitis, hypertension, hypothyroidism, hyperlipidemia, ulcerative colitis, who presented to the hospital on 02/21/2021 because of abdominal pain.  She was admitted to the hospital for acute diverticulitis.  Hospital course was complicated by perforated diverticulitis and she underwent emergent sigmoid colectomy with end colostomy and Hartman's procedure on 03/01/2021.  She was transferred to the ICU postoperatively.  She developed septic shock requiring multiple vasopressors.  She remained intubated on mechanical ventilation postoperatively.  She could not be liberated from the ventilator.  She failed extubation on 03/06/2021 and was reintubated.  ENT was consulted and tracheostomy was performed on 03/08/2021.      Assessment/Plan:   Principal Problem:   Diverticulitis Active Problems:   Fistula, bladder   Ulcerative colitis ([place])   Hyperlipidemia   Essential hypertension   Hypothyroidism   Abnormal ECG   CKD (chronic kidney disease), stage III (HCC)   Elevated lactic acid level   Protein-calorie malnutrition, severe   Nutrition Problem: Severe Malnutrition Etiology: acute illness  Signs/Symptoms: moderate fat depletion,moderate muscle depletion,severe muscle depletion   Body mass index is 24.21 kg/m.     Septic shock, hemorrhagic shock secondary to complicated diverticulitis with perforation, pelvic abscess, gangrene of the sigmoid colon: Shock physiology resolved.  S/p sigmoid colectomy with end colostomy, Hartman's procedure on 03/01/2021.  Completed antibiotics on 03/15/2021.  She has a wound VAC in place.  Continue local wound care.  Follow-up with general surgery for further  recommendations.  Dysphagia, Severe malnutrition: She is tolerating dysphagia 1 diet and nectar thick liquids.  Acute on chronic hypoxic respiratory failure: Failure to wean from ventilator requiring tracheostomy.  Patient was tolerating room air this morning.  Hypokalemia and hypomagnesemia: Replete potassium and magnesium.  Monitor levels  Non-anion gap metabolic acidosis: Improved. Mildly elevated creatinine: Encourage adequate oral hydration.  SVT, sinus tachycardia, atrial fibrillation: Continue amiodarone and Eliquis.  NSTEMI, ischemic cardiomyopathy, chronic systolic and diastolic CHF: Continue Toprol-XL 2D echo showed EF estimated at 20 to 25% and grade 1 diastolic dysfunction.  Acute urinary retention: Cystogram was done today and there was no evidence of leak.     Cystogram is recommended prior to Foley catheter removal.  Plan for cystogram today.  Follow-up with urologist for further recommendations.  Case discussed with Desiree Madden, [place] with urology.  Urology recommended removing Foley catheter tomorrow and obtaining postvoid residual 4 hours after removal of Foley catheter.  Urology also recommended 2 days of prophylactic antibiotics starting tonight.  Start amoxicillin.   Plan of care was discussed with her daughter, Desiree Madden, at the bedside.  She said she was told by the nursing home that patient has to wait 30 days after tracheostomy before they can take out the .  She requested reevaluation of tracheostomy by ENT surgeon.  I have reached out to Dr. Richardson Landry, ENT surgeon.  He states that he can remove tracheostomy at any time but he wanted to make sure that patient's pulmonary status is stable before tracheostomy is removed.  We will continue to monitor patient's pulmonary status.  If she continues to tolerate room air then ENT surgeon will be consulted for tracheostomy to be removed early next week.      Diet Order  DIET - DYS 1 Room service appropriate? Yes with  Assist; Fluid consistency: Nectar Thick  Diet effective now                    Consultants:  General surgeon  [place]  ENT surgeon  Urologist  Procedures:  Tracheostomy on 03/08/2021  Sigmoid colectomy with colostomy and Hartman's procedure  Cystorrhaphy by urologist on 03/02/2019 for colovesical fistula and diverticulitis/pelvic abscess    Medications:   . amiodarone  200 mg Oral BID  . apixaban  5 mg Oral BID  . vitamin C  500 mg Oral BID  . chlorhexidine gluconate (MEDLINE KIT)  15 mL Mouth Rinse BID  . Chlorhexidine Gluconate Cloth  6 each Topical Daily  . docusate  100 mg Oral BID  . feeding supplement (NEPRO CARB STEADY)  237 mL Oral TID BM  . insulin aspart  0-5 Units Subcutaneous QHS  . insulin aspart  0-9 Units Subcutaneous TID WC  . levothyroxine  75 mcg Oral Q0600  . mouth rinse  15 mL Mouth Rinse 10 times per day  . metoprolol succinate  12.5 mg Oral Daily  . multivitamin with minerals  1 tablet Oral Daily  . pantoprazole  40 mg Oral Daily  . polyethylene glycol  17 g Oral Daily  . sodium bicarbonate  650 mg Oral BID  . sodium chloride flush  10-40 mL Intracatheter Q12H   Continuous Infusions: . sodium chloride Stopped (03/20/21 1026)     Anti-infectives (From admission, onward)   Start     Dose/Rate Route Frequency Ordered Stop   03/13/21 1400  meropenem (MERREM) 1 g in sodium chloride 0.9 % 100 mL IVPB        1 g 200 mL/hr over 30 Minutes Intravenous Every 8 hours 03/13/21 0855 03/16/21 2203   03/12/21 1800  meropenem (MERREM) 1 g in sodium chloride 0.9 % 100 mL IVPB  Status:  Discontinued        1 g 200 mL/hr over 30 Minutes Intravenous Every 12 hours 03/12/21 0840 03/13/21 0855   03/10/21 1000  meropenem (MERREM) 1 g in sodium chloride 0.9 % 100 mL IVPB  Status:  Discontinued        1 g 200 mL/hr over 30 Minutes Intravenous Every 8 hours 03/10/21 0854 03/12/21 0840   03/09/21 0600  fluconazole (DIFLUCAN) IVPB 200 mg  Status:   Discontinued        200 mg 100 mL/hr over 60 Minutes Intravenous Every 24 hours 03/08/21 0751 03/14/21 1014   03/08/21 2200  meropenem (MERREM) 1 g in sodium chloride 0.9 % 100 mL IVPB  Status:  Discontinued        1 g 200 mL/hr over 30 Minutes Intravenous Every 12 hours 03/08/21 0748 03/10/21 0854   03/08/21 0600  fluconazole (DIFLUCAN) IVPB 400 mg  Status:  Discontinued        400 mg 100 mL/hr over 120 Minutes Intravenous Every 24 hours 03/07/21 1321 03/08/21 0839   03/07/21 1100  meropenem (MERREM) 1 g in sodium chloride 0.9 % 100 mL IVPB  Status:  Discontinued        1 g 200 mL/hr over 30 Minutes Intravenous Every 8 hours 03/07/21 1013 03/08/21 0748   03/07/21 1000  meropenem (MERREM) 1 g in sodium chloride 0.9 % 100 mL IVPB  Status:  Discontinued        1 g 200 mL/hr over 30 Minutes Intravenous Every 12 hours 03/06/21 2243 03/07/21 1013  03/07/21 0600  fluconazole (DIFLUCAN) IVPB 400 mg        400 mg 100 mL/hr over 120 Minutes Intravenous  Once 03/06/21 2243 03/07/21 0701   03/04/21 1800  vancomycin (VANCOREADY) IVPB 750 mg/150 mL        750 mg 150 mL/hr over 60 Minutes Intravenous Every 24 hours 03/03/21 1442 03/06/21 2040   03/03/21 0800  vancomycin (VANCOREADY) IVPB 1500 mg/300 mL  Status:  Discontinued        1,500 mg 150 mL/hr over 120 Minutes Intravenous Every 48 hours 03/02/21 1410 03/03/21 1442   03/02/21 0800  vancomycin (VANCOREADY) IVPB 750 mg/150 mL  Status:  Discontinued        750 mg 150 mL/hr over 60 Minutes Intravenous Every 24 hours 03/01/21 1954 03/02/21 1409   03/01/21 2200  ceFEPIme (MAXIPIME) 2 g in sodium chloride 0.9 % 100 mL IVPB        2 g 200 mL/hr over 30 Minutes Intravenous Every 12 hours 03/01/21 1903 03/06/21 2203   03/01/21 2100  vancomycin (VANCOREADY) IVPB 1250 mg/250 mL        1,250 mg 166.7 mL/hr over 90 Minutes Intravenous  Once 03/01/21 1954 03/02/21 0008   03/01/21 2000  metroNIDAZOLE (FLAGYL) IVPB 500 mg        500 mg 100 mL/hr over 60  Minutes Intravenous Every 8 hours 03/01/21 1903 03/06/21 2002   02/28/21 1800  piperacillin-tazobactam (ZOSYN) IVPB 3.375 g  Status:  Discontinued        3.375 g 12.5 mL/hr over 240 Minutes Intravenous Every 8 hours 02/28/21 1643 03/01/21 1903   02/28/21 1730  piperacillin-tazobactam (ZOSYN) IVPB 3.375 g  Status:  Discontinued        3.375 g 100 mL/hr over 30 Minutes Intravenous Every 8 hours 02/28/21 1635 02/28/21 1642   02/28/21 1300  Ampicillin-Sulbactam (UNASYN) 3 g in sodium chloride 0.9 % 100 mL IVPB  Status:  Discontinued        3 g 200 mL/hr over 30 Minutes Intravenous Every 6 hours 02/28/21 1156 02/28/21 1635   02/23/21 1230  piperacillin-tazobactam (ZOSYN) IVPB 3.375 g  Status:  Discontinued        3.375 g 12.5 mL/hr over 240 Minutes Intravenous Every 8 hours 02/23/21 1143 02/28/21 1155   02/22/21 0600  ciprofloxacin (CIPRO) IVPB 400 mg  Status:  Discontinued        400 mg 200 mL/hr over 60 Minutes Intravenous Every 12 hours 02/21/21 2028 02/23/21 1143   02/22/21 0400  metroNIDAZOLE (FLAGYL) IVPB 500 mg  Status:  Discontinued        500 mg 100 mL/hr over 60 Minutes Intravenous Every 8 hours 02/21/21 2028 02/23/21 1143   02/21/21 1830  ciprofloxacin (CIPRO) IVPB 400 mg        400 mg 200 mL/hr over 60 Minutes Intravenous  Once 02/21/21 1820 02/21/21 1939   02/21/21 1830  metroNIDAZOLE (FLAGYL) IVPB 500 mg        500 mg 100 mL/hr over 60 Minutes Intravenous  Once 02/21/21 1820 02/21/21 2110             Family Communication/Anticipated D/C date and plan/Code Status   DVT prophylaxis: SCDs Start: 02/21/21 2200     Code Status: DNR  Family Communication: None. Disposition Plan:    Status is: Inpatient  Remains inpatient appropriate because:Inpatient level of care appropriate due to severity of illness   Dispo: The patient is from: Home  Anticipated d/c is to: SNF              Patient currently is not medically stable to d/c.   Difficult to place  patient No           Subjective:   Interval events noted.  She feels well and has no complaints.  Her daughter, Desiree Madden, was at the bedside.  Objective:    Vitals:   03/27/21 0500 03/27/21 0527 03/27/21 0823 03/27/21 0848  BP:  123/71  125/74  Pulse:  79  79  Resp:  16  16  Temp:  98.4 F (36.9 C)  97.7 F (36.5 C)  TempSrc:  Oral    SpO2:  100% 100% 100%  Weight: 64 kg     Height:       No data found.   Intake/Output Summary (Last 24 hours) at 03/27/2021 1152 Last data filed at 03/27/2021 0715 Gross per 24 hour  Intake 200 ml  Output 950 ml  Net -750 ml   Filed Weights   03/25/21 0500 03/26/21 0500 03/27/21 0500  Weight: 53.4 kg 65.1 kg 64 kg    Exam:  GEN: NAD SKIN: Warm and dry EYES: EOMI  ENT: MMM, + tracheostomy CV: RRR PULM: CTA B ABD: soft, ND, NT, +BS, + colostomy, + wound VAC CNS: AAO x 3, non focal EXT: No edema or tenderness          Data Reviewed:   I have personally reviewed following labs and imaging studies:  Labs: Labs show the following:   Basic Metabolic Panel: Recent Labs  Lab 03/22/21 0616 03/23/21 0440 03/24/21 0436 03/26/21 0321 03/26/21 0500 03/27/21 0522  NA 141 140 142 140  --  138  K 4.8 3.6 3.6 2.5*  --  4.2  CL 111 110 110 116*  --  105  CO2 _0 18*  --  24  GLUCOSE 136* 97 87 82  --  104*  BUN 59* 52* 46* 26*  --  37*  CREATININE 0.90 0.81 0.97 0.68  --  1.05*  CALCIUM 8.6* 8.9 8.8* 6.5*  --  8.9  MG 2.3 2.0 2.1  --  1.4* 2.2  PHOS 3.6 3.9 5.0* 3.2  --  4.2   GFR Estimated Creatinine Clearance: 43.1 mL/min (A) (by C-G formula based on SCr of 1.05 mg/dL (H)). Liver Function Tests: No results for input(s): AST, ALT, ALKPHOS, BILITOT, PROT, ALBUMIN in the last 168 hours. No results for input(s): LIPASE, AMYLASE in the last 168 hours. No results for input(s): AMMONIA in the last 168 hours. Coagulation profile No results for input(s): INR, PROTIME in the last 168 hours.  CBC: Recent Labs   Lab 03/21/21 0318 03/22/21 0616 03/23/21 0440 03/26/21 0321  WBC 8.3 7.5 6.2 6.2  HGB 8.8* 9.6* 8.7* 9.6*  HCT 27.3* 29.6* 26.7* 29.6*  MCV 94.8 95.8 95.0 93.7  PLT 177 204 253 381   Cardiac Enzymes: No results for input(s): CKTOTAL, CKMB, CKMBINDEX, TROPONINI in the last 168 hours. BNP (last 3 results) No results for input(s): PROBNP in the last 8760 hours. CBG: Recent Labs  Lab 03/26/21 0815 03/26/21 1208 03/26/21 1558 03/26/21 2055 03/27/21 0849  GLUCAP 89 140* 132* 106* 92   D-Dimer: No results for input(s): DDIMER in the last 72 hours. Hgb A1c: No results for input(s): HGBA1C in the last 72 hours. Lipid Profile: No results for input(s): CHOL, HDL, LDLCALC, TRIG, CHOLHDL, LDLDIRECT in the last 72 hours. Thyroid function  studies: No results for input(s): TSH, T4TOTAL, T3FREE, THYROIDAB in the last 72 hours.  Invalid input(s): FREET3 Anemia work up: No results for input(s): VITAMINB12, FOLATE, FERRITIN, TIBC, IRON, RETICCTPCT in the last 72 hours. Sepsis Labs: Recent Labs  Lab 03/21/21 0318 03/22/21 0616 03/23/21 0440 03/26/21 0321  WBC 8.3 7.5 6.2 6.2    Microbiology Recent Results (from the past 240 hour(s))  Resp Panel by RT-PCR (Flu A&B, Covid) Nasopharyngeal Swab     Status: None   Collection Time: 03/26/21 10:29 AM   Specimen: Nasopharyngeal Swab; Nasopharyngeal(NP) swabs in vial transport medium  Result Value Ref Range Status   SARS Coronavirus 2 by RT PCR NEGATIVE NEGATIVE Final    Comment: (NOTE) SARS-CoV-2 target nucleic acids are NOT DETECTED.  The SARS-CoV-2 RNA is generally detectable in upper respiratory specimens during the acute phase of infection. The lowest concentration of SARS-CoV-2 viral copies this assay can detect is 138 copies/mL. A negative result does not preclude SARS-Cov-2 infection and should not be used as the sole basis for treatment or other patient management decisions. A negative result may occur with  improper  specimen collection/handling, submission of specimen other than nasopharyngeal swab, presence of viral mutation(s) within the areas targeted by this assay, and inadequate number of viral copies(<138 copies/mL). A negative result must be combined with clinical observations, patient history, and epidemiological information. The expected result is Negative.  Fact Sheet for Patients:  EntrepreneurPulse.com.au  Fact Sheet for Healthcare Providers:  IncredibleEmployment.be  This test is no t yet approved or cleared by the [place] FDA and  has been authorized for detection and/or diagnosis of SARS-CoV-2 by FDA under an Emergency Use Authorization (EUA). This EUA will remain  in effect (meaning this test can be used) for the duration of the COVID-19 declaration under Section 564(b)(1) of the Act, 21 U.S.C.section 360bbb-3(b)(1), unless the authorization is terminated  or revoked sooner.       Influenza A by PCR NEGATIVE NEGATIVE Final   Influenza B by PCR NEGATIVE NEGATIVE Final    Comment: (NOTE) The Xpert Xpress SARS-CoV-2/FLU/RSV plus assay is intended as an aid in the diagnosis of influenza from Nasopharyngeal swab specimens and should not be used as a sole basis for treatment. Nasal washings and aspirates are unacceptable for Xpert Xpress SARS-CoV-2/FLU/RSV testing.  Fact Sheet for Patients: EntrepreneurPulse.com.au  Fact Sheet for Healthcare Providers: IncredibleEmployment.be  This test is not yet approved or cleared by the [place] FDA and has been authorized for detection and/or diagnosis of SARS-CoV-2 by FDA under an Emergency Use Authorization (EUA). This EUA will remain in effect (meaning this test can be used) for the duration of the COVID-19 declaration under Section 564(b)(1) of the Act, 21 U.S.C. section 360bbb-3(b)(1), unless the authorization is terminated or revoked.  Performed at  [facility], [address] [place], [place] [address]     Procedures and diagnostic studies:  No results found.             LOS: 34 days   [place] [employer], advertising on [email or web address]. If 7PM-7AM, please contact night-coverage at [email or web address]     03/27/2021, 11:52 AM

## 2021-03-27 NOTE — Progress Notes (Signed)
SLP Cancellation Note  Patient Details Name: Desiree Madden MRN: [id number] DOB: 05-Sep-1951   Cancelled treatment:       Reason Eval/Treat Not Completed: Patient at procedure or test/unavailable (chart reviewed).  Pt remains out of the Room x2 attempts this morning.  Will f/u tomorrow w/ trials of Minced foods in hopes to upgrade oral diet consistency.       [name], MS, CCC-SLP Speech Language Pathologist Rehab Services [phone] [facility] 03/27/2021, 2:27 PM

## 2021-03-27 NOTE — Plan of Care (Signed)
End of Shift Summary:  Alert and oriented x2, reoriented. VSS. Oxygen sats 100% on 5L 28% trach collar. Suctioned x1. Xanax x1 for anxiety. PRN pain medications as needed. Denies n/v. Ankle foot orthotic device remained in place to LLE. Wound vac maintained to suction at 175mmHg. Ostomy appliance changed, (+) stool (+) flatus. Q2T as tolerated by pt. Remained free from falls or injury. Call bell within reach and able to use.   Problem: Health Behavior/Discharge Planning: Goal: Ability to manage health-related needs will improve Outcome: Progressing   Problem: Clinical Measurements: Goal: Ability to maintain clinical measurements within normal limits will improve Outcome: Progressing Goal: Will remain free from infection Outcome: Progressing Goal: Diagnostic test results will improve Outcome: Progressing Goal: Respiratory complications will improve Outcome: Progressing Goal: Cardiovascular complication will be avoided Outcome: Progressing

## 2021-03-27 NOTE — Progress Notes (Signed)
Rehab Admissions Coordinator Note:  Patient was screened by Cleatrice Burke for appropriateness for an Inpatient Acute Rehab Consult per change in therapy recommendations. Patient not yet at a level to be considered for a CIR admit. I will follow her progress.I will not place a rehab consult yet at this time. Await further activity tolerance demonstration.  Cleatrice Burke RN MSN 03/27/2021, 11:43 AM  I can be reached at [phone].

## 2021-03-27 NOTE — Progress Notes (Signed)
Occupational Therapy Treatment Patient Details Name: Desiree Madden MRN: [id number] DOB: 04/09/1951 Today's Date: 03/27/2021    History of present illness Desiree Madden is a 70 y.o. female with medical history significant for colonic bladder fistula, diverticulitis, hypertension, hypothyroidism, hyperlipidemia, ulcerative colitis, who presented to the hospital on 02/21/2021 because of abdominal pain.  She was admitted to the hospital for acute diverticulitis.  Hospital course was complicated by perforated diverticulitis and she underwent emergent sigmoid colectomy with end colostomy and Hartman's procedure on 03/01/2021.  She was transferred to the ICU postoperatively.  She developed septic shock requiring multiple vasopressors.  She remained intubated on mechanical ventilation postoperatively.  She could not be liberated from the ventilator.  She failed extubation on 03/06/2021 and was reintubated.  ENT was consulted and tracheostomy was performed on 03/08/2021.   Ultimately extubated 4/8.   OT comments  Upon entering the room, pt supine in bed with daughter present in the room. Pt pleasant and cooperative overall with daughter being very helpful and motivating pt for interventions. Pt performing supine >sit with max A for EOB with assistance for B LEs and trunk support. Pt is able to begin to move LEs towards EOB with effort but needing additional assist. Pt sitting EOB for ~ 3 minutes with min guard for static sitting balance. Pt does endorse feeling anxious about transfer but is agreeable. Stand pivot transfer with max A to recliner chair. Once seated, OT assists pt with getting LEs into figure four position to pull socks up further. Pt demonstrates leg lifts x 10 reps each but then requests to rest. OT props LEs up in recliner chair and assists with positioning. OT provided pt with items needed for oral care with pt using oral swab kit and min cuing for thoroughness. Pt then demonstrated feeding self ice  chips, 1 at a time, with close supervision and min cuing. Pt wearing PMSV during entire session without issue. OT discussed goals of pt sitting up from at least 1 hour and returning to bed with lift for safety. Pt and family are agreeable.   Pt making great progress towards OT goals. Pt has participated in 2 therapy sessions today, transferred into recliner chair with stand pivot, and agreeable to remaining up in chair. Therefore, OT changing recommendation to inpatient rehab to address functional deficits in order to optimize pt's level of independence before returning home. OT also increasing frequency to 3x/wk in order to continue to facilitate improvement. Pt making excellent progress this session with self care tasks, bed mobility, functional transfer, and sitting balance and will continue to benefit from OT intervention.   Follow Up Recommendations  CIR;Supervision/Assistance - 24 hour    Equipment Recommendations  Other (comment) (defer to next venue of care)       Precautions / Restrictions Precautions Precautions: Fall Precaution Comments: wound vac/trach/PMV       Mobility Bed Mobility Overal bed mobility: Needs Assistance       Supine to sit: Max assist     General bed mobility comments: Pt does begin to move B LEs towards EOB but needing assistance to full get onto floor and for trunk support    Transfers Overall transfer level: Needs assistance Equipment used: None Transfers: Sit to/from Stand Sit to Stand: Max assist         General transfer comment: Pt does report being fearful of transfer to recliner chair but agreeable to attempt    Balance Overall balance assessment: Needs assistance Sitting-balance support: Feet supported Sitting  balance-Leahy Scale: Fair     Standing balance support: Bilateral upper extremity supported;During functional activity Standing balance-Leahy Scale: Zero                             ADL either performed or  assessed with clinical judgement   ADL Overall ADL's : Needs assistance/impaired     Grooming: Oral care;Set up;Supervision/safety Grooming Details (indicate cue type and reason): use of oral care with min cuing for thoroughness     Lower Body Bathing: Moderate assistance Lower Body Bathing Details (indicate cue type and reason): assist to come into figure four position and min guard for balance while leaning forward                             Vision Patient Visual Report: No change from baseline            Cognition Arousal/Alertness: Awake/alert Behavior During Therapy: WFL for tasks assessed/performed Overall Cognitive Status: Impaired/Different from baseline Area of Impairment: Orientation;Following commands;Safety/judgement;Problem solving;Awareness                 Orientation Level: Disoriented to;Time;Situation     Following Commands: Follows one step commands with increased time;Follows one step commands consistently;Follows multi-step commands inconsistently Safety/Judgement: Decreased awareness of safety;Decreased awareness of deficits Awareness: Intellectual Problem Solving: Difficulty sequencing;Requires verbal cues;Requires tactile cues General Comments: Pt is alert and agreeable to OT intervention with daughter present in room. She was oriented to self only. Poor insights to deficits and needing additional cuing for independence this session.                   Pertinent Vitals/ Pain       Pain Assessment: No/denies pain         Frequency  Min 3X/week        Progress Toward Goals  OT Goals(current goals can now be found in the care plan section)  Progress towards OT goals: Progressing toward goals  Acute Rehab OT Goals Patient Stated Goal: to get better OT Goal Formulation: With patient/family Time For Goal Achievement: 04/04/21 Potential to Achieve Goals: [place] Discharge plan needs to be updated;Frequency needs to  be updated       AM-PAC OT "6 Clicks" Daily Activity     Outcome Measure   Help from another person eating meals?: A Little Help from another person taking care of personal grooming?: A Lot Help from another person toileting, which includes using toliet, bedpan, or urinal?: A Lot Help from another person bathing (including washing, rinsing, drying)?: A Lot Help from another person to put on and taking off regular upper body clothing?: A Lot Help from another person to put on and taking off regular lower body clothing?: A Lot 6 Click Score: 13    End of Session Equipment Utilized During Treatment: Other (comment) (trach)  OT Visit Diagnosis: Unsteadiness on feet (R26.81);Muscle weakness (generalized) (M62.81);Other symptoms and signs involving cognitive function   Activity Tolerance Patient tolerated treatment well   Patient Left with call bell/phone within reach;in chair;with chair alarm set;with family/visitor present   Nurse Communication Mobility status;Need for lift equipment;Precautions        Time: 7169-6789 OT Time Calculation (min): 39 min  Charges: OT General Charges $OT Visit: 1 Visit OT Treatments $Self Care/Home Management : 23-37 mins $Therapeutic Activity: 8-22 mins  Darleen Crocker, MS, OTR/L , CBIS  ascom [phone]  03/27/21, 5:05 PM

## 2021-03-28 DIAGNOSIS — E43 Unspecified severe protein-calorie malnutrition: Secondary | ICD-10-CM | POA: Diagnosis not present

## 2021-03-28 DIAGNOSIS — K5792 Diverticulitis of intestine, part unspecified, without perforation or abscess without bleeding: Secondary | ICD-10-CM | POA: Diagnosis not present

## 2021-03-28 DIAGNOSIS — I10 Essential (primary) hypertension: Secondary | ICD-10-CM | POA: Diagnosis not present

## 2021-03-28 DIAGNOSIS — N322 Vesical fistula, not elsewhere classified: Secondary | ICD-10-CM | POA: Diagnosis not present

## 2021-03-28 LAB — BASIC METABOLIC PANEL
Anion gap: 7 (ref 5–15)
BUN: 35 mg/dL — ABNORMAL HIGH (ref 8–23)
CO2: 26 mmol/L (ref 22–32)
Calcium: 9.1 mg/dL (ref 8.9–10.3)
Chloride: 105 mmol/L (ref 98–111)
Creatinine, Ser: 0.99 mg/dL (ref 0.44–1.00)
GFR, Estimated: >60 mL/min (ref >60)
Glucose, Bld: 109 mg/dL — ABNORMAL HIGH (ref 70–99)
Potassium: 3.8 mmol/L (ref 3.5–5.1)
Sodium: 138 mmol/L (ref 135–145)

## 2021-03-28 LAB — GLUCOSE, CAPILLARY
Glucose-Capillary: 98 mg/dL (ref 70–99)
Glucose-Capillary: 109 mg/dL — ABNORMAL HIGH (ref 70–99)
Glucose-Capillary: 130 mg/dL — ABNORMAL HIGH (ref 70–99)
Glucose-Capillary: 108 mg/dL — ABNORMAL HIGH (ref 70–99)

## 2021-03-28 LAB — MAGNESIUM: Magnesium: 2 mg/dL (ref 1.7–2.4)

## 2021-03-28 NOTE — Progress Notes (Incomplete)
Pt eating breakfast, wheezes upon expiration. Lungs assessed, NAD and bilateral wheezes. Pt is able to cough and clear airway. Respiratory therapist notified.

## 2021-03-28 NOTE — Progress Notes (Signed)
Urology Inpatient Progress Note  Subjective: Patient underwent cystogram yesterday with no evidence of leak.  Foley catheter removed overnight and patient was started on 48 hours of Augmentin for UTI prevention in the setting of prolonged urinary catheterization. Per nursing notes, bladder scan was performed approximately 8 hours after Foley removal.  Patient had not yet voided at that time and denied the urge to void.  Bladder scan with 328m and she was I&O catheterized.  She continued to deny the urge to void and repeat bladder scan this morning with 232 mL. Patient reports feeling better today and is relieved to no longer have a Foley catheter in place.  She reports ongoing LLQ discomfort.  She denies sensation of bladder fullness or the urge to void.  Anti-infectives: Anti-infectives (From admission, onward)   Start     Dose/Rate Route Frequency Ordered Stop   03/27/21 2200  amoxicillin (AMOXIL) capsule 500 mg        500 mg Oral Every 8 hours 03/27/21 1401 03/29/21 2159   03/13/21 1400  meropenem (MERREM) 1 g in sodium chloride 0.9 % 100 mL IVPB        1 g 200 mL/hr over 30 Minutes Intravenous Every 8 hours 03/13/21 0855 03/16/21 2203   03/12/21 1800  meropenem (MERREM) 1 g in sodium chloride 0.9 % 100 mL IVPB  Status:  Discontinued        1 g 200 mL/hr over 30 Minutes Intravenous Every 12 hours 03/12/21 0840 03/13/21 0855   03/10/21 1000  meropenem (MERREM) 1 g in sodium chloride 0.9 % 100 mL IVPB  Status:  Discontinued        1 g 200 mL/hr over 30 Minutes Intravenous Every 8 hours 03/10/21 0854 03/12/21 0840   03/09/21 0600  fluconazole (DIFLUCAN) IVPB 200 mg  Status:  Discontinued        200 mg 100 mL/hr over 60 Minutes Intravenous Every 24 hours 03/08/21 0751 03/14/21 1014   03/08/21 2200  meropenem (MERREM) 1 g in sodium chloride 0.9 % 100 mL IVPB  Status:  Discontinued        1 g 200 mL/hr over 30 Minutes Intravenous Every 12 hours 03/08/21 0748 03/10/21 0854   03/08/21 0600   fluconazole (DIFLUCAN) IVPB 400 mg  Status:  Discontinued        400 mg 100 mL/hr over 120 Minutes Intravenous Every 24 hours 03/07/21 1321 03/08/21 0839   03/07/21 1100  meropenem (MERREM) 1 g in sodium chloride 0.9 % 100 mL IVPB  Status:  Discontinued        1 g 200 mL/hr over 30 Minutes Intravenous Every 8 hours 03/07/21 1013 03/08/21 0748   03/07/21 1000  meropenem (MERREM) 1 g in sodium chloride 0.9 % 100 mL IVPB  Status:  Discontinued        1 g 200 mL/hr over 30 Minutes Intravenous Every 12 hours 03/06/21 2243 03/07/21 1013   03/07/21 0600  fluconazole (DIFLUCAN) IVPB 400 mg        400 mg 100 mL/hr over 120 Minutes Intravenous  Once 03/06/21 2243 03/07/21 0701   03/04/21 1800  vancomycin (VANCOREADY) IVPB 750 mg/150 mL        750 mg 150 mL/hr over 60 Minutes Intravenous Every 24 hours 03/03/21 1442 03/06/21 2040   03/03/21 0800  vancomycin (VANCOREADY) IVPB 1500 mg/300 mL  Status:  Discontinued        1,500 mg 150 mL/hr over 120 Minutes Intravenous Every 48 hours 03/02/21  1410 03/03/21 1442   03/02/21 0800  vancomycin (VANCOREADY) IVPB 750 mg/150 mL  Status:  Discontinued        750 mg 150 mL/hr over 60 Minutes Intravenous Every 24 hours 03/01/21 1954 03/02/21 1409   03/01/21 2200  ceFEPIme (MAXIPIME) 2 g in sodium chloride 0.9 % 100 mL IVPB        2 g 200 mL/hr over 30 Minutes Intravenous Every 12 hours 03/01/21 1903 03/06/21 2203   03/01/21 2100  vancomycin (VANCOREADY) IVPB 1250 mg/250 mL        1,250 mg 166.7 mL/hr over 90 Minutes Intravenous  Once 03/01/21 1954 03/02/21 0008   03/01/21 2000  metroNIDAZOLE (FLAGYL) IVPB 500 mg        500 mg 100 mL/hr over 60 Minutes Intravenous Every 8 hours 03/01/21 1903 03/06/21 2002   02/28/21 1800  piperacillin-tazobactam (ZOSYN) IVPB 3.375 g  Status:  Discontinued        3.375 g 12.5 mL/hr over 240 Minutes Intravenous Every 8 hours 02/28/21 1643 03/01/21 1903   02/28/21 1730  piperacillin-tazobactam (ZOSYN) IVPB 3.375 g  Status:   Discontinued        3.375 g 100 mL/hr over 30 Minutes Intravenous Every 8 hours 02/28/21 1635 02/28/21 1642   02/28/21 1300  Ampicillin-Sulbactam (UNASYN) 3 g in sodium chloride 0.9 % 100 mL IVPB  Status:  Discontinued        3 g 200 mL/hr over 30 Minutes Intravenous Every 6 hours 02/28/21 1156 02/28/21 1635   02/23/21 1230  piperacillin-tazobactam (ZOSYN) IVPB 3.375 g  Status:  Discontinued        3.375 g 12.5 mL/hr over 240 Minutes Intravenous Every 8 hours 02/23/21 1143 02/28/21 1155   02/22/21 0600  ciprofloxacin (CIPRO) IVPB 400 mg  Status:  Discontinued        400 mg 200 mL/hr over 60 Minutes Intravenous Every 12 hours 02/21/21 2028 02/23/21 1143   02/22/21 0400  metroNIDAZOLE (FLAGYL) IVPB 500 mg  Status:  Discontinued        500 mg 100 mL/hr over 60 Minutes Intravenous Every 8 hours 02/21/21 2028 02/23/21 1143   02/21/21 1830  ciprofloxacin (CIPRO) IVPB 400 mg        400 mg 200 mL/hr over 60 Minutes Intravenous  Once 02/21/21 1820 02/21/21 1939   02/21/21 1830  metroNIDAZOLE (FLAGYL) IVPB 500 mg        500 mg 100 mL/hr over 60 Minutes Intravenous  Once 02/21/21 1820 02/21/21 2110      Current Facility-Administered Medications  Medication Dose Route Frequency Provider Last Rate Last Admin  . 0.9 %  sodium chloride infusion  250 mL Intravenous Continuous Jennye Boroughs, MD   Stopped at 03/20/21 1026  . ALPRAZolam Duanne Moron) tablet 0.25 mg  0.25 mg Oral BID PRN Jennye Boroughs, MD   0.25 mg at 03/27/21 2223  . amiodarone (PACERONE) tablet 200 mg  200 mg Oral BID Jennye Boroughs, MD   200 mg at 03/27/21 2025  . amoxicillin (AMOXIL) capsule 500 mg  500 mg Oral Q8H Jennye Boroughs, MD   500 mg at 03/28/21 0502  . apixaban (ELIQUIS) tablet 5 mg  5 mg Oral BID Jennye Boroughs, MD   5 mg at 03/27/21 2025  . ascorbic acid (VITAMIN C) tablet 500 mg  500 mg Oral BID Jennye Boroughs, MD   500 mg at 03/27/21 2025  . chlorhexidine gluconate (MEDLINE KIT) (PERIDEX) 0.12 % solution 15 mL  15 mL Mouth  Rinse  BID Jennye Boroughs, MD   15 mL at 03/27/21 1924  . Chlorhexidine Gluconate Cloth 2 % PADS 6 each  6 each Topical Daily Jennye Boroughs, MD   6 each at 03/27/21 2026  . docusate (COLACE) 50 MG/5ML liquid 100 mg  100 mg Oral BID Jennye Boroughs, MD   100 mg at 03/27/21 2025  . feeding supplement (NEPRO CARB STEADY) liquid 237 mL  237 mL Oral TID BM Jennye Boroughs, MD   237 mL at 03/27/21 1752  . HYDROmorphone (DILAUDID) injection 0.5 mg  0.5 mg Intravenous Q3H PRN Jennye Boroughs, MD   0.5 mg at 03/28/21 0318  . insulin aspart (novoLOG) injection 0-5 Units  0-5 Units Subcutaneous QHS Jennye Boroughs, MD      . insulin aspart (novoLOG) injection 0-9 Units  0-9 Units Subcutaneous TID WC Jennye Boroughs, MD   1 Units at 03/27/21 1752  . ipratropium-albuterol (DUONEB) 0.5-2.5 (3) MG/3ML nebulizer solution 3 mL  3 mL Nebulization Q4H PRN Jennye Boroughs, MD      . levothyroxine (SYNTHROID) tablet 75 mcg  75 mcg Oral Q0600 Jennye Boroughs, MD   75 mcg at 03/28/21 0503  . lip balm (BLISTEX) ointment   Topical PRN Jennye Boroughs, MD   Given at 03/18/21 [phone]  . MEDLINE mouth rinse  15 mL Mouth Rinse 10 times per day Jennye Boroughs, MD   15 mL at 03/28/21 0503  . metoprolol succinate (TOPROL-XL) 24 hr tablet 12.5 mg  12.5 mg Oral Daily Jennye Boroughs, MD   12.5 mg at 03/27/21 1231  . multivitamin with minerals tablet 1 tablet  1 tablet Oral Daily Jennye Boroughs, MD   1 tablet at 03/27/21 1231  . ondansetron (ZOFRAN) tablet 4 mg  4 mg Oral Q6H PRN Jennye Boroughs, MD       Or  . ondansetron (ZOFRAN) injection 4 mg  4 mg Intravenous Q6H PRN Jennye Boroughs, MD      . oxyCODONE (Oxy IR/ROXICODONE) immediate release tablet 5 mg  5 mg Oral Q6H PRN Jennye Boroughs, MD   5 mg at 03/28/21 0503  . pantoprazole (PROTONIX) EC tablet 40 mg  40 mg Oral Daily Jennye Boroughs, MD   40 mg at 03/27/21 1231  . polyethylene glycol (MIRALAX / GLYCOLAX) packet 17 g  17 g Oral Daily Jennye Boroughs, MD   17 g at 03/27/21 1232  . sodium  chloride flush (NS) 0.9 % injection 10-40 mL  10-40 mL Intracatheter Q12H Jennye Boroughs, MD   10 mL at 03/27/21 2026  . sodium chloride flush (NS) 0.9 % injection 10-40 mL  10-40 mL Intracatheter PRN Jennye Boroughs, MD         Objective: Vital signs in last 24 hours: Temp:  [98 F (36.7 C)-98.7 F (37.1 C)] 98.7 F (37.1 C) (04/20 0744) Pulse Rate:  [81-92] 92 (04/20 0744) Resp:  [16-18] 18 (04/20 0744) BP: (111-132)/(64-77) 111/70 (04/20 0744) SpO2:  [97 %-100 %] 100 % (04/20 0744) Weight:  [64.3 kg] 64.3 kg (04/20 0457)  Intake/Output from previous day: 04/19 0701 - 04/20 0700 In: 268 [P.O.:268] Out: 1270 [Urine:1270] Intake/Output this shift: Total I/O In: -  Out: 50 [Stool:50]  Physical Exam Vitals and nursing note reviewed.  Constitutional:      General: She is not in acute distress.    Appearance: She is not ill-appearing, toxic-appearing or diaphoretic.  HENT:     Head: Normocephalic and atraumatic.  Pulmonary:     Effort: Pulmonary effort is normal. No  respiratory distress.  Skin:    General: Skin is warm and dry.  Neurological:     Mental Status: She is alert and oriented to person, place, and time.  Psychiatric:        Mood and Affect: Mood normal.        Behavior: Behavior normal.    Lab Results:  Recent Labs    03/26/21 0321  WBC 6.2  HGB 9.6*  HCT 29.6*  PLT 381   BMET Recent Labs    03/27/21 0522 03/28/21 0537  NA 138 138  K 4.2 3.8  CL 105 105  CO2 24 26  GLUCOSE 104* 109*  BUN 37* 35*  CREATININE 1.05* 0.99  CALCIUM 8.9 9.1   Studies/Results: DG Cystogram  Result Date: 03/27/2021 CLINICAL DATA:  Post bladder repair EXAM: CYSTOGRAM TECHNIQUE: The bladder was filled with 250 mL Cysto-conray 30% by drip infusion utilizing indwelling Foley catheter. Serial spot images were obtained during bladder filling. FLUOROSCOPY TIME:  Fluoroscopy Time:  18 seconds Radiation Exposure Index (if provided by the fluoroscopic device): 1.9 mGy  COMPARISON:  None. FINDINGS: Filling of the bladder demonstrates no contour abnormality or filling defect apart from Foley balloon. There is no evidence of contrast extravasation. There is no reflux into the ureters. IMPRESSION: No evidence of leak. Electronically Signed   By: Macy Mis M.D.   On: 03/27/2021 11:58   Assessment & Plan: 70 year old female with prolonged hospitalization with perforated diverticulitis with pelvic abscess and gangrene of the sigmoid colon.  She underwent intraoperative bladder repair with Dr. Diamantina Providence for management of a colovesical fistula.  Foley catheter removed yesterday after cystogram revealed no contrast leaks from the bladder.  I would like to avoid Foley catheter replacement if at all possible in this patient.  Recommend serial bladder scans and I&O catheterization for residuals greater than 450 mL or with the urge to void but the inability to do so.  We will maintain a high threshold for Foley catheter replacement and will continue to monitor.  Debroah Loop, PA-C 03/28/2021

## 2021-03-28 NOTE — Consult Note (Addendum)
[place] Nurse wound follow up Vac dressing change was already performed early this morning by the staff nurse since it was leaking. Currently intact with good seal to cont suction at 129mm with minimal amt pink drainage in the cannister. WOC will plan to change again Fri.  Wound type:Abd with full thickness post-op wound to midline  WOC Nurse ostomy follow up Stoma type/location:Colostomy Stomal assessment/size:1 and1/4inches round, red, raisedabove skin level. There is a crease at 9:00 o'clock to the stoma and another deep crease below stoma; applied barrier ring to attempt to maintain a seal. Output:mod amt liquid brown stool Ostomy pouching:Pt is very alert and ready for teaching. No family members have ever been present.  Demonstrated pouch change using hand held mirror, pt watched the process and asked appropriate questions.  Applied2pc.2 and 1/4  inch pouching system with skin barrier ring(Barrier Kellie Simmering # [id number], wafer #[id number], pouch # 109) 4 sets of extra supplies at the bedside for staff nurse use. Progress notes indicate pt will discharge toSNF or [facility] inpatient rehab  Enrolled patient in [place] Start Discharge program:Yes Julien Girt MSN, RN, [place], CWCN-AP, CNS

## 2021-03-28 NOTE — Progress Notes (Signed)
PROGRESS NOTE    Desiree Madden   PNT:[id number]  DOB: [birth year]  PCP: [facility]    DOA: 02/21/2021 LOS: 35   Brief Narrative   Desiree Madden is a 69 y.o. female with medical history significant for colonic bladder fistula, diverticulitis, hypertension, hypothyroidism, hyperlipidemia, ulcerative colitis, who presented to the hospital on 02/21/2021 because of abdominal pain.  She was admitted with acute diverticulitis.    Hospital course was complicated by perforated diverticulitis and she underwent emergent sigmoid colectomy with end colostomy and Hartman's procedure on 03/01/2021.  She was transferred to the ICU postoperatively, developed septic shock requiring multiple vasopressors.  She remained intubated on mechanical ventilation postoperatively and subsequent difficulty liberating pt from the ventilator, failed extubation on 03/06/2021 and was reintubated.   ENT was consulted and tracheostomy was performed on 03/08/2021.    Assessment & Plan   Principal Problem:   Diverticulitis Active Problems:   Fistula, bladder   Ulcerative colitis ([place])   Hyperlipidemia   Essential hypertension   Hypothyroidism   Abnormal ECG   CKD (chronic kidney disease), stage III (HCC)   Elevated lactic acid level   Protein-calorie malnutrition, severe   Septic shock, hemorrhagic shock secondary to complicated diverticulitis with perforation, pelvic abscess, colo-vesicular fistula, gangrene of the sigmoid colon: Shock physiology resolved, ICU course as outlined above.   S/p sigmoid colectomy with end colostomy, Hartman's procedure on 03/01/2021. Completed antibiotics on 03/15/2021.   --Wound VAC in place --Continue local wound care --Follow-up with general surgery for further recommendations.  Dysphagia - due to trach in place Severe malnutrition - tolerating dysphagia 1 diet and nectar thick liquids --encourage PO intake  --appreciate dietician and SLP recommendations  Acute on  chronic hypoxic respiratory failure with tracheostomy placed - Failure to wean from ventilator requiring tracheostomy.  Patient has been tolerating room air yesterday and today (4/19-20).   --please continue humidification overnight per pt and daughter request --my colleague, Dr. Mal Misty, discussed with ENT Dr. Richardson Landry re trach removal.  Want to ensure stable respiratory status and not requiring any support prior to removal.  Possible removal early next week.  Hypokalemia / Hypomagnesemia - Replaced K and Mg.  --Monitor and further replacement as needed  Non-anion gap metabolic acidosis - Improved. Mildly elevated creatinine: Encourage adequate oral hydration.  SVT, nonsustained, Sinus tachycardia, paroxysmal atrial fibrillation -  --Continue amiodarone and Eliquis.  NSTEMI, ischemic cardiomyopathy, chronic systolic and diastolic CHF - appears well compensated.  2D echo showed EF estimated at 20 to 25% and grade 1 diastolic dysfunction. --Continue Toprol-XL  Acute urinary retention - Urology repaired colo-vesicular fistula.  Cystogram was done 4/19 and there was no evidence of leak.  Foley removed. --appreciate urology recommendations --in/out cath in retaining over 450 cc --Urol recommended two days antibiotics with cysto for prophylaxis, on amoxicilllin --monitor post-void residuals  Tracheostomy in place - follow ENT Dr. Richardson Landry.  Possible removal early next week if respiratory status remains stable.    Patient BMI: Body mass index is 24.32 kg/m.   DVT prophylaxis: SCDs Start: 02/21/21 2200 apixaban (ELIQUIS) tablet 5 mg   Diet:  Diet Orders (From admission, onward)    Start     Ordered   03/28/21 1433  DIET - DYS 1 Room service appropriate? Yes with Assist; Fluid consistency: Nectar Thick  Diet effective now       Comments: Just a Puree Diet. May have Oatmeal per Speech w/ butter, sugar. Yogurt, pudding, magic cup on trays. NO STRAWS.  Question  Answer Comment  Room service  appropriate? Yes with Assist   Fluid consistency: Nectar Thick      03/28/21 1433            Code Status: DNR    Subjective 03/28/21    Patient seen on rounds this morning, daughter at bedside.  Patient denies having any pain.  Wants her feet freed from the offloading boots in place.  Denies nausea or vomiting.  Hoping trach can be removed soon.   Disposition Plan & Communication   Status is: Inpatient  Remains inpatient appropriate because:Inpatient level of care appropriate due to severity of illness, ongoing urinary retention requiring monitoring in and out cath, requires rehab placement due to profound generalized weakness.   Dispo: The patient is from: Home              Anticipated d/c is to: CIR              Patient currently is not medically stable to d/c.   Difficult to place patient No   Family Communication: Daughter at bedside on rounds this morning   Consults, Procedures, Significant Events   Consultants:   General surgery  Urology  ENT  PCCM  Procedures:   Tracheostomy 03/08/2021  Sigmoid colectomy with end colostomy, Hartman's procedure 03/01/2021  Cystorrhaphy by urology on 3/24 for repair of colovesicular fistula  Antimicrobials:  Anti-infectives (From admission, onward)   Start     Dose/Rate Route Frequency Ordered Stop   03/27/21 2200  amoxicillin (AMOXIL) capsule 500 mg        500 mg Oral Every 8 hours 03/27/21 1401 03/29/21 2159   03/13/21 1400  meropenem (MERREM) 1 g in sodium chloride 0.9 % 100 mL IVPB        1 g 200 mL/hr over 30 Minutes Intravenous Every 8 hours 03/13/21 0855 03/16/21 2203   03/12/21 1800  meropenem (MERREM) 1 g in sodium chloride 0.9 % 100 mL IVPB  Status:  Discontinued        1 g 200 mL/hr over 30 Minutes Intravenous Every 12 hours 03/12/21 0840 03/13/21 0855   03/10/21 1000  meropenem (MERREM) 1 g in sodium chloride 0.9 % 100 mL IVPB  Status:  Discontinued        1 g 200 mL/hr over 30 Minutes Intravenous Every  8 hours 03/10/21 0854 03/12/21 0840   03/09/21 0600  fluconazole (DIFLUCAN) IVPB 200 mg  Status:  Discontinued        200 mg 100 mL/hr over 60 Minutes Intravenous Every 24 hours 03/08/21 0751 03/14/21 1014   03/08/21 2200  meropenem (MERREM) 1 g in sodium chloride 0.9 % 100 mL IVPB  Status:  Discontinued        1 g 200 mL/hr over 30 Minutes Intravenous Every 12 hours 03/08/21 0748 03/10/21 0854   03/08/21 0600  fluconazole (DIFLUCAN) IVPB 400 mg  Status:  Discontinued        400 mg 100 mL/hr over 120 Minutes Intravenous Every 24 hours 03/07/21 1321 03/08/21 0839   03/07/21 1100  meropenem (MERREM) 1 g in sodium chloride 0.9 % 100 mL IVPB  Status:  Discontinued        1 g 200 mL/hr over 30 Minutes Intravenous Every 8 hours 03/07/21 1013 03/08/21 0748   03/07/21 1000  meropenem (MERREM) 1 g in sodium chloride 0.9 % 100 mL IVPB  Status:  Discontinued        1 g 200 mL/hr over 30 Minutes  Intravenous Every 12 hours 03/06/21 2243 03/07/21 1013   03/07/21 0600  fluconazole (DIFLUCAN) IVPB 400 mg        400 mg 100 mL/hr over 120 Minutes Intravenous  Once 03/06/21 2243 03/07/21 0701   03/04/21 1800  vancomycin (VANCOREADY) IVPB 750 mg/150 mL        750 mg 150 mL/hr over 60 Minutes Intravenous Every 24 hours 03/03/21 1442 03/06/21 2040   03/03/21 0800  vancomycin (VANCOREADY) IVPB 1500 mg/300 mL  Status:  Discontinued        1,500 mg 150 mL/hr over 120 Minutes Intravenous Every 48 hours 03/02/21 1410 03/03/21 1442   03/02/21 0800  vancomycin (VANCOREADY) IVPB 750 mg/150 mL  Status:  Discontinued        750 mg 150 mL/hr over 60 Minutes Intravenous Every 24 hours 03/01/21 1954 03/02/21 1409   03/01/21 2200  ceFEPIme (MAXIPIME) 2 g in sodium chloride 0.9 % 100 mL IVPB        2 g 200 mL/hr over 30 Minutes Intravenous Every 12 hours 03/01/21 1903 03/06/21 2203   03/01/21 2100  vancomycin (VANCOREADY) IVPB 1250 mg/250 mL        1,250 mg 166.7 mL/hr over 90 Minutes Intravenous  Once 03/01/21 1954  03/02/21 0008   03/01/21 2000  metroNIDAZOLE (FLAGYL) IVPB 500 mg        500 mg 100 mL/hr over 60 Minutes Intravenous Every 8 hours 03/01/21 1903 03/06/21 2002   02/28/21 1800  piperacillin-tazobactam (ZOSYN) IVPB 3.375 g  Status:  Discontinued        3.375 g 12.5 mL/hr over 240 Minutes Intravenous Every 8 hours 02/28/21 1643 03/01/21 1903   02/28/21 1730  piperacillin-tazobactam (ZOSYN) IVPB 3.375 g  Status:  Discontinued        3.375 g 100 mL/hr over 30 Minutes Intravenous Every 8 hours 02/28/21 1635 02/28/21 1642   02/28/21 1300  Ampicillin-Sulbactam (UNASYN) 3 g in sodium chloride 0.9 % 100 mL IVPB  Status:  Discontinued        3 g 200 mL/hr over 30 Minutes Intravenous Every 6 hours 02/28/21 1156 02/28/21 1635   02/23/21 1230  piperacillin-tazobactam (ZOSYN) IVPB 3.375 g  Status:  Discontinued        3.375 g 12.5 mL/hr over 240 Minutes Intravenous Every 8 hours 02/23/21 1143 02/28/21 1155   02/22/21 0600  ciprofloxacin (CIPRO) IVPB 400 mg  Status:  Discontinued        400 mg 200 mL/hr over 60 Minutes Intravenous Every 12 hours 02/21/21 2028 02/23/21 1143   02/22/21 0400  metroNIDAZOLE (FLAGYL) IVPB 500 mg  Status:  Discontinued        500 mg 100 mL/hr over 60 Minutes Intravenous Every 8 hours 02/21/21 2028 02/23/21 1143   02/21/21 1830  ciprofloxacin (CIPRO) IVPB 400 mg        400 mg 200 mL/hr over 60 Minutes Intravenous  Once 02/21/21 1820 02/21/21 1939   02/21/21 1830  metroNIDAZOLE (FLAGYL) IVPB 500 mg        500 mg 100 mL/hr over 60 Minutes Intravenous  Once 02/21/21 1820 02/21/21 2110        Micro    Objective   Vitals:   03/28/21 0457 03/28/21 0744 03/28/21 1214 03/28/21 1624  BP:  111/70 107/76 119/72  Pulse:  92 87 85  Resp:  18 18 18   Temp:  98.7 F (37.1 C) (!) 97 F (36.1 C) (!) 97 F (36.1 C)  TempSrc:  Oral  SpO2:  100% 99% 96%  Weight: 64.3 kg     Height:        Intake/Output Summary (Last 24 hours) at 03/28/2021 1628 Last data filed at  03/28/2021 1353 Gross per 24 hour  Intake 568 ml  Output 420 ml  Net 148 ml   Filed Weights   03/26/21 0500 03/27/21 0500 03/28/21 0457  Weight: 65.1 kg 64 kg 64.3 kg    Physical Exam:  General exam: awake, alert, no acute distress HEENT: Trach in place with PMV, speaking well, moist mucus membranes, hearing grossly normal  Respiratory system: CTAB, no wheezes, rales or rhonchi, normal respiratory effort, on room air. Cardiovascular system: normal S1/S2, RRR, no pedal edema.   Gastrointestinal system: soft, NT, ND, +bowel sounds, new colostomy bag in place, healthy appearing stoma. Central nervous system: A&O x3. no gross focal neurologic deficits, normal speech Extremities: moves all, no edema, normal tone, offloading boots on bilateral feet, palpable pedal pulses bilaterally Psychiatry: normal mood, congruent affect, judgement and insight appear normal  Labs   Data Reviewed: I have personally reviewed following labs and imaging studies  CBC: Recent Labs  Lab 03/22/21 0616 03/23/21 0440 03/26/21 0321  WBC 7.5 6.2 6.2  HGB 9.6* 8.7* 9.6*  HCT 29.6* 26.7* 29.6*  MCV 95.8 95.0 93.7  PLT 204 253 237   Basic Metabolic Panel: Recent Labs  Lab 03/22/21 0616 03/23/21 0440 03/24/21 0436 03/26/21 0321 03/26/21 0500 03/27/21 0522 03/28/21 0537  NA 141 140 142 140  --  138 138  K 4.8 3.6 3.6 2.5*  --  4.2 3.8  CL 111 110 110 116*  --  105 105  CO2 23 22 24  18*  --  24 26  GLUCOSE 136* 97 87 82  --  104* 109*  BUN 59* 52* 46* 26*  --  37* 35*  CREATININE 0.90 0.81 0.97 0.68  --  1.05* 0.99  CALCIUM 8.6* 8.9 8.8* 6.5*  --  8.9 9.1  MG 2.3 2.0 2.1  --  1.4* 2.2 2.0  PHOS 3.6 3.9 5.0* 3.2  --  4.2  --    GFR: Estimated Creatinine Clearance: 45.7 mL/min (by C-G formula based on SCr of 0.99 mg/dL). Liver Function Tests: No results for input(s): AST, ALT, ALKPHOS, BILITOT, PROT, ALBUMIN in the last 168 hours. No results for input(s): LIPASE, AMYLASE in the last 168  hours. No results for input(s): AMMONIA in the last 168 hours. Coagulation Profile: No results for input(s): INR, PROTIME in the last 168 hours. Cardiac Enzymes: No results for input(s): CKTOTAL, CKMB, CKMBINDEX, TROPONINI in the last 168 hours. BNP (last 3 results) No results for input(s): PROBNP in the last 8760 hours. HbA1C: No results for input(s): HGBA1C in the last 72 hours. CBG: Recent Labs  Lab 03/27/21 1727 03/27/21 2033 03/28/21 0813 03/28/21 1217 03/28/21 1625  GLUCAP 121* 116* 98 109* 130*   Lipid Profile: No results for input(s): CHOL, HDL, LDLCALC, TRIG, CHOLHDL, LDLDIRECT in the last 72 hours. Thyroid Function Tests: No results for input(s): TSH, T4TOTAL, FREET4, T3FREE, THYROIDAB in the last 72 hours. Anemia Panel: No results for input(s): VITAMINB12, FOLATE, FERRITIN, TIBC, IRON, RETICCTPCT in the last 72 hours. Sepsis Labs: No results for input(s): PROCALCITON, LATICACIDVEN in the last 168 hours.  Recent Results (from the past 240 hour(s))  Resp Panel by RT-PCR (Flu A&B, Covid) Nasopharyngeal Swab     Status: None   Collection Time: 03/26/21 10:29 AM   Specimen: Nasopharyngeal Swab; Nasopharyngeal(NP) swabs  in vial transport medium  Result Value Ref Range Status   SARS Coronavirus 2 by RT PCR NEGATIVE NEGATIVE Final    Comment: (NOTE) SARS-CoV-2 target nucleic acids are NOT DETECTED.  The SARS-CoV-2 RNA is generally detectable in upper respiratory specimens during the acute phase of infection. The lowest concentration of SARS-CoV-2 viral copies this assay can detect is 138 copies/mL. A negative result does not preclude SARS-Cov-2 infection and should not be used as the sole basis for treatment or other patient management decisions. A negative result may occur with  improper specimen collection/handling, submission of specimen other than nasopharyngeal swab, presence of viral mutation(s) within the areas targeted by this assay, and inadequate number of  viral copies(<138 copies/mL). A negative result must be combined with clinical observations, patient history, and epidemiological information. The expected result is Negative.  Fact Sheet for Patients:  EntrepreneurPulse.com.au  Fact Sheet for Healthcare Providers:  IncredibleEmployment.be  This test is no t yet approved or cleared by the [place] FDA and  has been authorized for detection and/or diagnosis of SARS-CoV-2 by FDA under an Emergency Use Authorization (EUA). This EUA will remain  in effect (meaning this test can be used) for the duration of the COVID-19 declaration under Section 564(b)(1) of the Act, 21 U.S.C.section 360bbb-3(b)(1), unless the authorization is terminated  or revoked sooner.       Influenza A by PCR NEGATIVE NEGATIVE Final   Influenza B by PCR NEGATIVE NEGATIVE Final    Comment: (NOTE) The Xpert Xpress SARS-CoV-2/FLU/RSV plus assay is intended as an aid in the diagnosis of influenza from Nasopharyngeal swab specimens and should not be used as a sole basis for treatment. Nasal washings and aspirates are unacceptable for Xpert Xpress SARS-CoV-2/FLU/RSV testing.  Fact Sheet for Patients: EntrepreneurPulse.com.au  Fact Sheet for Healthcare Providers: IncredibleEmployment.be  This test is not yet approved or cleared by the [place] FDA and has been authorized for detection and/or diagnosis of SARS-CoV-2 by FDA under an Emergency Use Authorization (EUA). This EUA will remain in effect (meaning this test can be used) for the duration of the COVID-19 declaration under Section 564(b)(1) of the Act, 21 U.S.C. section 360bbb-3(b)(1), unless the authorization is terminated or revoked.  Performed at [facility], [address] [place], [place] [address]       Imaging Studies   DG Cystogram  Result Date: 03/27/2021 CLINICAL DATA:  Post bladder repair EXAM:  CYSTOGRAM TECHNIQUE: The bladder was filled with 250 mL Cysto-conray 30% by drip infusion utilizing indwelling Foley catheter. Serial spot images were obtained during bladder filling. FLUOROSCOPY TIME:  Fluoroscopy Time:  18 seconds Radiation Exposure Index (if provided by the fluoroscopic device): 1.9 mGy COMPARISON:  None. FINDINGS: Filling of the bladder demonstrates no contour abnormality or filling defect apart from Foley balloon. There is no evidence of contrast extravasation. There is no reflux into the ureters. IMPRESSION: No evidence of leak. Electronically Signed   By: Macy Mis M.D.   On: 03/27/2021 11:58     Medications   Scheduled Meds: . amiodarone  200 mg Oral BID  . amoxicillin  500 mg Oral Q8H  . apixaban  5 mg Oral BID  . vitamin C  500 mg Oral BID  . chlorhexidine gluconate (MEDLINE KIT)  15 mL Mouth Rinse BID  . Chlorhexidine Gluconate Cloth  6 each Topical Daily  . docusate  100 mg Oral BID  . feeding supplement (NEPRO CARB STEADY)  237 mL Oral TID BM  .  insulin aspart  0-5 Units Subcutaneous QHS  . insulin aspart  0-9 Units Subcutaneous TID WC  . levothyroxine  75 mcg Oral Q0600  . mouth rinse  15 mL Mouth Rinse 10 times per day  . metoprolol succinate  12.5 mg Oral Daily  . multivitamin with minerals  1 tablet Oral Daily  . pantoprazole  40 mg Oral Daily  . polyethylene glycol  17 g Oral Daily  . sodium chloride flush  10-40 mL Intracatheter Q12H   Continuous Infusions: . sodium chloride Stopped (03/20/21 1026)       LOS: 35 days    Time spent: 30 minutes    Ezekiel Slocumb, DO [facility]  03/28/2021, 4:28 PM      If 7PM-7AM, please contact night-coverage. How to contact the [facility] Attending or Consulting provider [place] or covering provider during after hours [place], for this patient?    1. Check the care team in [facility] and look for a) attending/consulting TR[facility] provider listed and b) the [facility] team listed 2. Log into [email or web address] and use  [place]'s universal password to access. If you do not have the password, please contact the hospital operator. 3. Locate the [facility] provider you are looking for under [facility] and page to a number that you can be directly reached. 4. If you still have difficulty reaching the provider, please page the [facility] (Director on Call) for the Hospitalists listed on amion for assistance.

## 2021-03-28 NOTE — Progress Notes (Signed)
I concur with the Student Rn's nursing assessment.  Sandria Senter, RN

## 2021-03-28 NOTE — Hospital Course (Signed)
Desiree Madden is a 70 y.o. female with medical history significant for colonic bladder fistula, diverticulitis, hypertension, hypothyroidism, hyperlipidemia, ulcerative colitis, who presented to the hospital on 02/21/2021 because of abdominal pain.  She was admitted with acute diverticulitis.    Hospital course was complicated by perforated diverticulitis and she underwent emergent sigmoid colectomy with end colostomy and Hartman's procedure on 03/01/2021.  She was transferred to the ICU postoperatively, developed septic shock requiring multiple vasopressors.  She remained intubated on mechanical ventilation postoperatively and subsequent difficulty liberating pt from the ventilator, failed extubation on 03/06/2021 and was reintubated.   ENT was consulted and tracheostomy was performed on 03/08/2021.

## 2021-03-28 NOTE — Progress Notes (Signed)
Occupational Therapy Treatment Patient Details Name: Ellanie Oppedisano MRN: [id number] DOB: 14-Sep-1951 Today's Date: 03/28/2021    History of present illness Tamina Cyphers is a 70 y.o. female with medical history significant for colonic bladder fistula, diverticulitis, hypertension, hypothyroidism, hyperlipidemia, ulcerative colitis, who presented to the hospital on 02/21/2021 because of abdominal pain.  She was admitted to the hospital for acute diverticulitis.  Hospital course was complicated by perforated diverticulitis and she underwent emergent sigmoid colectomy with end colostomy and Hartman's procedure on 03/01/2021.  She was transferred to the ICU postoperatively.  She developed septic shock requiring multiple vasopressors.  She remained intubated on mechanical ventilation postoperatively.  She could not be liberated from the ventilator.  She failed extubation on 03/06/2021 and was reintubated.  ENT was consulted and tracheostomy was performed on 03/08/2021.   Ultimately extubated 4/8.   OT comments  Upon entering the room, pt seated in recliner chair and agreeable to OT intervention. Pt has been sitting up since last therapy session ~ 3 hours ago. Pt standing with max A and stand pivot back to bed with max A. Pt requesting ice chips and pt performs oral care with set up A and min cuing for thoroughness. OT assists pt with donning PMSV for intake. Pt given min cuing for 1 ice chip from spoon at a time without talking. Pt sitting on EOB and motivated to get hair washed. Pt needing min guard for sitting balance and pt assisted with putting soap in hair and scrubbing scalp. Pt needing assistance to get all soap out and then she brushes hair while seated EOB for ~ 13 minutes. Pt returning to bed with max A for trunk control and B LEs. OT assisted pt with repositioning in bed. Pt's daughter present at end of session and remains in room with pt. OT continues to recommend intensive inpatient rehab program at  discharge increase pt's independence in self care. Pt tolerating sitting up for ~ 3 hours and 2 therapy sessions this date. Pt and family very motivated and cooperative. Bed alarm activated with all needs within reach upon exiting the room.   Follow Up Recommendations  CIR;Supervision/Assistance - 24 hour          Precautions / Restrictions Precautions Precautions: Fall Precaution Comments: wound vac/trach/PMV       Mobility Bed Mobility Overal bed mobility: Needs Assistance Bed Mobility: Supine to Sit;Sit to Supine;Sit to Sidelying;Sidelying to Sit Rolling: Mod assist Sidelying to sit: Mod assist;Max assist Supine to sit: Max assist Sit to supine: Max assist   General bed mobility comments: Pt did require slightly less assistance to exit bed. Vcs for step by step sequencing. Supervision for sitting balance once seated EOB.    Transfers Overall transfer level: Needs assistance Equipment used: None Transfers: Sit to/from [facility] Sit to Stand: Max assist Stand pivot transfers: Max assist       General transfer comment: pt performed STS 3 x and stand pivot 1 x. STS to RW 1 x from EOB prior to stand pivot without use of AD. Once seated in recliner performed STS 2 more times.    Balance Overall balance assessment: Needs assistance Sitting-balance support: Feet supported Sitting balance-Leahy Scale: Fair     Standing balance support: Bilateral upper extremity supported;During functional activity Standing balance-Leahy Scale: Poor                             ADL either performed or assessed  with clinical judgement   ADL Overall ADL's : Needs assistance/impaired                                       General ADL Comments: min guard for sitting balance for pt to wash hair while seated on EOB. Pt performs oral care with set up A and min cuing for thoroughness.     Vision Patient Visual Report: No change from baseline             Cognition Arousal/Alertness: Awake/alert Behavior During Therapy: Flat affect Overall Cognitive Status: Impaired/Different from baseline Area of Impairment: Orientation;Following commands;Safety/judgement;Problem solving;Awareness;Attention                 Orientation Level: Disoriented to;Time;Situation     Following Commands: Follows one step commands with increased time;Follows one step commands consistently;Follows multi-step commands inconsistently Safety/Judgement: Decreased awareness of safety;Decreased awareness of deficits Awareness: Intellectual Problem Solving: Difficulty sequencing;Requires verbal cues;Requires tactile cues General Comments: Pt often asks for help before attempting herself and needs encouragement. Pt very pleasant throughout.              General Comments Reviewed ther ex with pt and pt's daughter.    Pertinent Vitals/ Pain       Pain Assessment: Faces Faces Pain Scale: No hurt         Frequency  Min 3X/week        Progress Toward Goals  OT Goals(current goals can now be found in the care plan section)  Progress towards OT goals: Progressing toward goals  Acute Rehab OT Goals Patient Stated Goal: to get better OT Goal Formulation: With patient/family Time For Goal Achievement: 04/04/21 Potential to Achieve Goals: [place] Discharge plan needs to be updated;Frequency needs to be updated    Co-evaluation        PT goals addressed during session: Mobility/safety with mobility;Proper use of DME;Balance;Strengthening/ROM        AM-PAC OT "6 Clicks" Daily Activity     Outcome Measure   Help from another person eating meals?: A Little Help from another person taking care of personal grooming?: A Lot Help from another person toileting, which includes using toliet, bedpan, or urinal?: A Lot Help from another person bathing (including washing, rinsing, drying)?: A Lot Help from another person to put on and taking off  regular upper body clothing?: A Lot Help from another person to put on and taking off regular lower body clothing?: A Lot 6 Click Score: 13    End of Session Equipment Utilized During Treatment: Other (comment) (trach)  OT Visit Diagnosis: Unsteadiness on feet (R26.81);Muscle weakness (generalized) (M62.81);Other symptoms and signs involving cognitive function   Activity Tolerance Patient tolerated treatment well   Patient Left with call bell/phone within reach;in chair;with chair alarm set;with family/visitor present   Nurse Communication Mobility status;Precautions        Time: 3086-5784 OT Time Calculation (min): 55 min  Charges: OT General Charges $OT Visit: 1 Visit OT Treatments $Self Care/Home Management : 38-52 mins $Therapeutic Activity: 8-22 mins  Darleen Crocker, MS, OTR/L , CBIS ascom [phone]  03/28/21, 4:20 PM

## 2021-03-28 NOTE — Progress Notes (Signed)
Calorie Count Note  48 hour calorie count ordered.  Diet: DYS 2, nectar thick Supplements: Magic Cup TID, Nepro TID  4/20 - Breakfast: 150 kcal, 6 g of protein Supplements: 498 kcal, 21 g of protein   Total intake: 648 kcal (38% of minimum estimated needs)  27 protein (27% of minimum estimated needs)  Spoke with RN, dinner tray was removed from room quickly last night (4/19) and amounts consumed were unable to be visualized/recorded. Only have 1 meal to calculate for calorie count so far. Intake so far today is encouraging. The majority of nutrition pt is receiving is through supplements. Intake of meals is minimal. Will follow-up with pt tomorrow to determine amount consumed for the remainder of the day.  Nutrition Dx: Severe Malnutrition related to acute illness as evidenced by moderate fat depletion,moderate muscle depletion,severe muscle depletion.  Goal: Patient will meet greater than or equal to 90% of their needs   Intervention:  Nepro Shake po TID, each supplement provides 425 kcal and 19 grams protein  Magic cup TID with meals, each supplement provides 290 kcal and 9 grams of protein  MVI po daily   Vitamin C $RemoveB'500mg'amuyqfcE$  po BID   Ranell Patrick, RD, LDN Clinical Dietitian Pager on [place]

## 2021-03-28 NOTE — Progress Notes (Addendum)
Physical Therapy Treatment Patient Details Name: Desiree Madden MRN: [id number] DOB: 1951/06/16 Today's Date: 03/28/2021    History of Present Illness Desiree Madden is a 70 y.o. female with medical history significant for colonic bladder fistula, diverticulitis, hypertension, hypothyroidism, hyperlipidemia, ulcerative colitis, who presented to the hospital on 02/21/2021 because of abdominal pain.  She was admitted to the hospital for acute diverticulitis.  Hospital course was complicated by perforated diverticulitis and she underwent emergent sigmoid colectomy with end colostomy and Hartman's procedure on 03/01/2021.  She was transferred to the ICU postoperatively.  She developed septic shock requiring multiple vasopressors.  She remained intubated on mechanical ventilation postoperatively.  She could not be liberated from the ventilator.  She failed extubation on 03/06/2021 and was reintubated.  ENT was consulted and tracheostomy was performed on 03/08/2021.   Ultimately extubated 4/8.    PT Comments    Pt was long sitting in bed upon arriving. Supportive daughter at bedside. Pt is alert but disoriented x 2. She needs a little encouragement but once motivated did cooperate. Pt has anxiety and fear of falling. Was able to to exit R side of bed with slightly less assistance today. Stood 1 x EOB to RW prior to stand pivot to recliner with no UE support (squat pivot). Proceeded to stand 2 x to RW from recliner height.  Minimal standing tolerance due to fear. After transfer training, reviewed there ex previously issued and both pt/pt's daughter state they have been performing. Pt was sitting in recliner post session with call bell in reach and chair alarm in place. OT to see pt later this date. Highly recommend CIR at DC to address deficits while improving independence.    Follow Up Recommendations  CIR     Equipment Recommendations  None recommended by PT    Recommendations for Other Services Rehab  consult     Precautions / Restrictions Precautions Precautions: Fall Precaution Comments: wound vac/trach/PMV    Mobility  Bed Mobility Overal bed mobility: Needs Assistance Bed Mobility: Sidelying to Sit;Supine to Sit;Sit to Supine;Sit to Sidelying Rolling: Mod assist Sidelying to sit: Mod assist;Max assist Supine to sit: Max assist     General bed mobility comments: Pt did require slightly less assistance to exit bed. Vcs for step by step sequencing. Supervision for sitting balance once seated EOB.    Transfers Overall transfer level: Needs assistance Equipment used: None;Rolling walker (2 wheeled) Transfers: Sit to/from Stand Sit to Stand: Max assist         General transfer comment: pt performed STS 3 x and stand pivot 1 x. STS to RW 1 x from EOB prior to stand pivot without use of AD. Once seated in recliner performed STS 2 more times.  Ambulation/Gait    General Gait Details: unsafe to ambulate       Balance Overall balance assessment: Needs assistance Sitting-balance support: Feet supported Sitting balance-Leahy Scale: Fair     Standing balance support: Bilateral upper extremity supported;During functional activity Standing balance-Leahy Scale: Poor       Cognition Arousal/Alertness: Awake/alert Behavior During Therapy: Anxious Overall Cognitive Status: Impaired/Different from baseline Area of Impairment: Orientation;Following commands;Safety/judgement;Problem solving;Awareness    Orientation Level: Disoriented to;Time;Situation     Following Commands: Follows one step commands with increased time;Follows one step commands consistently;Follows multi-step commands inconsistently Safety/Judgement: Decreased awareness of safety;Decreased awareness of deficits Awareness: Intellectual Problem Solving: Difficulty sequencing;Requires verbal cues;Requires tactile cues General Comments: Pt was A but continues to be disoriented. She needs constant  encouragement.  Pt is very anxious/ fearful of falling during all standing activity.         General Comments General comments (skin integrity, edema, etc.): Reviewed ther ex with pt and pt's daughter.      Pertinent Vitals/Pain Pain Assessment: No/denies pain           PT Goals (current goals can now be found in the care plan section) Acute Rehab PT Goals Patient Stated Goal: to get better Progress towards PT goals: Progressing toward goals    Frequency    Min 2X/week      PT Plan Current plan remains appropriate    Co-evaluation     PT goals addressed during session: Mobility/safety with mobility;Proper use of DME;Balance;Strengthening/ROM        AM-PAC PT "6 Clicks" Mobility   Outcome Measure  Help needed turning from your back to your side while in a flat bed without using bedrails?: A Lot Help needed moving from lying on your back to sitting on the side of a flat bed without using bedrails?: A Lot Help needed moving to and from a bed to a chair (including a wheelchair)?: A Lot Help needed standing up from a chair using your arms (e.g., wheelchair or bedside chair)?: Total Help needed to walk in hospital room?: Total Help needed climbing 3-5 steps with a railing? : Total 6 Click Score: 9    End of Session Equipment Utilized During Treatment: Gait belt Activity Tolerance: Patient tolerated treatment well;Patient limited by fatigue Patient left: in chair;with call bell/phone within reach;with chair alarm set;with family/visitor present Nurse Communication: Mobility status PT Visit Diagnosis: Muscle weakness (generalized) (M62.81);Unsteadiness on feet (R26.81);Other abnormalities of gait and mobility (R26.89)     Time: 9728-2060 PT Time Calculation (min) (ACUTE ONLY): 24 min  Charges:  $Therapeutic Activity: 23-37 mins                     Julaine Fusi PTA 03/28/21, 2:36 PM

## 2021-03-28 NOTE — Progress Notes (Signed)
CBG results failed to flow over to results . CBG result of 98 at 0813 this a.m.

## 2021-03-28 NOTE — Care Management Important Message (Signed)
Important Message  Patient Details  Name: Desiree Madden MRN: [id number] Date of Birth: Mar 20, 1951   Medicare Important Message Given:  Yes     Juliann Pulse A French Kendra 03/28/2021, 10:56 AM

## 2021-03-28 NOTE — Progress Notes (Signed)
Speech Language Pathology Treatment: Dysphagia  Patient Details Name: Desiree Madden MRN: [id number] DOB: Aug 06, 1951 Today's Date: 03/28/2021 Time: 2951-8841 SLP Time Calculation (min) (ACUTE ONLY): 60 min  Assessment / Plan / Recommendation Clinical Impression  Pt seen forongoingassessment of toleration of oral diet -- dysphagia level 1 w/ Nectar liquids post MBSS on 03/22/2021, and trials to upgrade to Minced foods in her diet today. Pt has been demonstrating improvement w/ tolerance of wearof PMV this week now wearing most of the day but w/ Supervision d/t falling asleep. Pt exhibits appropriate vocal quality for verbal communicationwhen wearing the PMV w/ no respiratory distress or increased secretions. Pt's Alertness is also much improvedthough she needs cues for maintaining alertness during tx sessions w/ other disciplines at times. Note some degree of Cognitive decline in her limited communication responses (responses lack detail) and need forModcues for follow through w/ tasks. Unsure of impact of lack of sufficient O2 initially requiring intubation/re-intubation and/or extended illness on pt's Cognitive status. Tracheostomywasplaced on 03/08/2021 after ~2 weeks of oral intubation and failing vent wean trials. Pt appears successful in her weaning trials from [place]; PMV was in place w/ no TC support again today(x2 session/days now) -- ENT/MD assessing pt's ability to be de-cannulated altogether per chart notes. Pt has a Shiley size 6 tube Cuffed,but Cuff is fully deflated at Baseline. Daughter present (briefly) stated pt has tolerated the PMV well during visit this morning. Supervision required d/t pt falling asleep easily.  Pt presented w/Alertness and engaged w/ this [place] entering room. Pt sitting in chair.Oral Care given w/ appropriate lingual/labial responses. Ptresponded to basic questions from SLP w/ adequate vocal quality w/ no increased RR effort/rate  noted. Pt denied any distress while wearing the PMV. Cognitive decline noted in pt's simple responses which lacked detail; also noted pt only initiated in verbal communication when spoken to. She often repeated the same verbal responses: "that's right", "alright now". Pt will need a formal Cognitive assessment to determine strengths and deficits, and POC to address any needs of communication deficits in ADLs.  SLP moved toIce Chip trials prior to po trials of Minced Foods. Pt exhibited timely oral phase management and even mastication w/ the ice chips w/ prompt swallows. W/ trials of Minced Foods, increased oral phase time and lengthy mastication time noted trials of Minced pancake then tuna salad(Not on bread) -- pt munched on trials for 45+ seconds. Mastication was Not rotary, oral holding noted when pt was distracted then returned to the bolus. Verbal cues given to address attention to the boluses during the oral phase. Pt Expectorated 3/5 of the pancake boluses and 1/3 of the tuna salad boluses after lengthy oral phase time stating: "that's that". She declined further trials and returned to the Nectar liquids -- consumed 8 ozs of Nectar liquids stating "that's good".  Suspect pt's Oral phase Dysphagia and presentation w/ increased Textured foods is impacted by Cognitive decline -- pt was eating a solid foods diet prior to this hospitalization per Daughter.  Recommend continue w/ a Dysphagia level 1 diet w/ Nectar liquids; aspiration precautions. Pills Crushed in Puree. Feeding Supervision at all meals for follow through w/ precautions and to encourage po's, but allow pt to self-feed for Cognitive engagement. Signs re: PMV and precautions posted in pt's room. Dietician following for Calorie Count d/t pt's reduced oral intake overall. Drink supplement will be the most successful form of nutritional supplement for pt as she prefers drinking liquids(Nectar consistency).  Discussed PMV use/wear/care  precautions as well as aspiration precautions during any oral intake. Dtr asked good questions. Recommendfrequent oral care for hygiene and stimulation of swallowing.Recommend PRN wear/use of PMV for verbal communication w/ othersand w/ ALL oral intakew/ 100% Supervision and following precautions as posted above bed in room. PMV wear will aid in Pulmonary status improvement and in engagement and communication w/ other to support Cognitive/mental status improvement and awareness. Encouraged Dtr to call and talk to pt via phone having [place] place and monitor the PMV. Recommend [place] PRN Single ice chips while wearing the PMV(MUST WEAR PMV FOR ORAL INTAKE) following aspiration precautions.  Recommendcontinue w/ a Dysphagia level 1 (puree) diet w/ Nectar consistency liquids Via Cup; general aspiration precautions. Support at meals to encourage oral intake, feeding when needed. Reduce distractions at meals. Pt MUST WEAR THE PMV W/ ALL ORAL INTAKE. Recommend Dietician f/u for support.Daughter agreed w/ Erma Pinto phone call. NSG/MDupdated.    HPI HPI:  pt is a 70 y.o. female with medical history significant for colonic bladder fistula, diverticulitis, hypertension, hypothyroidism, hyperlipidemia, ulcerative colitis, who presented to the hospital on 02/21/2021 because of abdominal pain.  She was admitted to the hospital for acute diverticulitis.  Hospital course was complicated by perforated diverticulitis and she underwent emergent sigmoid colectomy with end colostomy and Hartman's procedure on 8/54/6270 for complicated diverticulitis with gangrenous changes and perforation with feculent peritonitis and 4 days s/p colostomy revision.  She was transferred to the ICU postoperatively.  She developed septic shock requiring multiple vasopressors.  She remained intubated on mechanical ventilation postoperatively.  She could not be liberated from the ventilator.  She failed extubation on 03/06/2021 and was reintubated.   ENT was consulted and tracheostomy was performed on 03/08/2021.  She is now weaning from trach collar O2 support.  MBSS completed on 03/22/2021.       SLP Plan  Continue with current plan of care       Recommendations  Diet recommendations: Dysphagia 1 (puree);Nectar-thick liquid Liquids provided via: Cup;No straw Medication Administration: Crushed with puree (for safer swallowing) Supervision: Patient able to self feed;Staff to assist with self feeding;Full supervision/cueing for compensatory strategies Compensations: Minimize environmental distractions;Slow rate;Small sips/bites;Lingual sweep for clearance of pocketing;Multiple dry swallows after each bite/sip;Follow solids with liquid Postural Changes and/or Swallow Maneuvers: Out of bed for meals;Seated upright 90 degrees;Upright 30-60 min after meal      Patient may use Passy-Muir Speech Valve: Intermittently with supervision;During all therapies with supervision;During all waking hours (remove during sleep);During PO intake/meals (MUST be awake) PMSV Supervision: Full MD: Please consider changing trach tube to : Cuffless         General recommendations: Rehab consult Oral Care Recommendations: Oral care BID;Oral care before and after PO;Staff/trained caregiver to provide oral care Follow up Recommendations: Inpatient Rehab SLP Visit Diagnosis: Dysphagia, oropharyngeal phase (R13.12);[place] (R49.1) (tracheostomy) Plan: Continue with current plan of care       GO                 [name], [place], CCC-SLP Speech Language Pathologist Rehab Services [phone] [facility] 03/28/2021, 2:44 PM

## 2021-03-28 NOTE — Plan of Care (Signed)
Shift Summary: Pt orientedx3, remained on RA via trach overnight, VSS. C/o addressed with PRN meds per MAR. PMSV in place during PO intake, tolerated well. Bladder scan appx 8hrs after foley removal d/t no void, I/O for 35ml clear yellow urine; no urge to void so re-scan in AM with 256ml showing. Minimal ostomy ouptut, large volumes of gas requiring frequent ostomy burping. NPWT sucked ostomy output from underneath dressing leading to blockage alarm on system; dressing changed w/o issue in attempts to keep wound clean; pt tolerated well. Fall/safety precautions in place, rounding performed, needs/concerns addressed during shift.   Problem: Education: Goal: Knowledge of General Education information will improve Description: Including pain rating scale, medication(s)/side effects and non-pharmacologic comfort measures Outcome: Progressing   Problem: Health Behavior/Discharge Planning: Goal: Ability to manage health-related needs will improve Outcome: Progressing   Problem: Clinical Measurements: Goal: Ability to maintain clinical measurements within normal limits will improve Outcome: Progressing Goal: Will remain free from infection Outcome: Progressing Goal: Diagnostic test results will improve Outcome: Progressing Goal: Respiratory complications will improve Outcome: Progressing Goal: Cardiovascular complication will be avoided Outcome: Progressing   Problem: Activity: Goal: Risk for activity intolerance will decrease Outcome: Progressing   Problem: Nutrition: Goal: Adequate nutrition will be maintained Outcome: Progressing   Problem: Coping: Goal: Level of anxiety will decrease Outcome: Progressing   Problem: Elimination: Goal: Will not experience complications related to bowel motility Outcome: Progressing Goal: Will not experience complications related to urinary retention Outcome: Progressing   Problem: Pain Managment: Goal: General experience of comfort will  improve Outcome: Progressing   Problem: Safety: Goal: Ability to remain free from injury will improve Outcome: Progressing   Problem: Skin Integrity: Goal: Risk for impaired skin integrity will decrease Outcome: Progressing   Problem: Education: Goal: Knowledge about tracheostomy care/management will improve Outcome: Progressing   Problem: Activity: Goal: Ability to tolerate increased activity will improve Outcome: Progressing   Problem: Health Behavior/Discharge Planning: Goal: Ability to manage tracheostomy will improve Outcome: Progressing   Problem: Respiratory: Goal: Patent airway maintenance will improve Outcome: Progressing   Problem: Role Relationship: Goal: Ability to communicate will improve Outcome: Progressing   Problem: Education: Goal: Knowledge of ostomy care will improve Outcome: Progressing Goal: Understanding of discharge needs will improve Outcome: Progressing   Problem: Bowel/Gastric/Urinary: Goal: Gastrointestinal status for postoperative course will improve Outcome: Progressing   Problem: Coping: Goal: Coping ability will improve Outcome: Progressing   Problem: Fluid Volume: Goal: Ability to achieve a balanced intake and output will improve Outcome: Progressing   Problem: Health Behavior/Discharge Planning: Goal: Ability to manage health-related needs will improve Outcome: Progressing   Problem: Nutrition: Goal: Will attain and maintain optimal nutritional status will improve Outcome: Progressing   Problem: Clinical Measurements: Goal: Postoperative complications will be avoided or minimized Outcome: Progressing   Problem: Skin Integrity: Goal: Will show signs of wound healing Outcome: Progressing Goal: Risk for impaired skin integrity will decrease Outcome: Progressing

## 2021-03-28 NOTE — TOC Progression Note (Signed)
Transition of Care [facility]) - Progression Note    Patient Details  Name: Nafeesa Dils MRN: [id number] Date of Birth: 1951/09/18  Transition of Care [facility]) CM/SW Contact  Shelbie Hutching, RN Phone Number: 03/28/2021, 2:31 PM  Clinical Narrative:    CIR will evaluate patient for admission.  CIR case manager has a meeting set up with patient and family for tomorrow at 77 for admission assessment.    Expected Discharge Plan: IP Rehab Facility Barriers to Discharge: Other (comment)  Expected Discharge Plan and Services Expected Discharge Plan: [place]   Discharge Planning Services: CM Consult Post Acute Care Choice: IP Rehab                   DME Arranged: N/A DME Agency: NA       HH Arranged: NA           Social Determinants of Health (SDOH) Interventions    Readmission Risk Interventions No flowsheet data found.

## 2021-03-28 NOTE — Progress Notes (Signed)
Inpatient Rehabilitation Admissions Coordinator  I contacted pt's daughter, Yamileth, by phone. I have scheduled to meet her at bedside 4/21 at 1530 for rehab assessment and discussions. I have alerted acute team and TOC of my plans.  Danne Baxter, RN, MSN Rehab Admissions Coordinator [phone] 03/28/2021 2:21 PM

## 2021-03-28 NOTE — Progress Notes (Signed)
Desiree Madden for Electrolyte Monitoring and Replacement   Recent Labs: Potassium (mmol/L)  Date Value  03/28/2021 3.8   Magnesium (mg/dL)  Date Value  03/28/2021 2.0   Calcium (mg/dL)  Date Value  03/28/2021 9.1   Albumin (g/dL)  Date Value  03/16/2021 2.4 (L)   Phosphorus (mg/dL)  Date Value  03/27/2021 4.2   Sodium (mmol/L)  Date Value  03/28/2021 138   Assessment: Desiree Madden is a 70 y.o. female admitted on 02/21/2021 with intra-abdominal infection (diverticulitis w/ fecal perforation). Patient septic with acute diverticulitis abdominal abscess with colovesical fistula and enterococcus urinary tract infection which was further complicated by the development of complicated diverticulitis with perforation and gangrenous changes requiring Hartman's procedure performed on 03/01/21. PMH includes CAD s/p CABG (2015), HTN, hypothyroidism, colon bladder fistula, diverticulitis, and ulcerative colitis. Pharmacy was consulted for electrolyte monitoring.  Nutrition: Tube feeds stopped. SLP has cleared patient for dysphagia 1 diet  Goal of Therapy:  K 4.0 - 5.1 mmol/L Mg 2.0 - 2.4 mg/dL All Other Electrolytes WNL  Plan:   No additional supplementation needed at this time  Re-check electrolytes with AM labs  Chinita Greenland PharmD Clinical Pharmacist 03/28/2021

## 2021-03-29 DIAGNOSIS — K5792 Diverticulitis of intestine, part unspecified, without perforation or abscess without bleeding: Secondary | ICD-10-CM | POA: Diagnosis not present

## 2021-03-29 LAB — BASIC METABOLIC PANEL WITH GFR
Anion gap: 8 (ref 5–15)
BUN: 37 mg/dL — ABNORMAL HIGH (ref 8–23)
CO2: 26 mmol/L (ref 22–32)
Calcium: 9 mg/dL (ref 8.9–10.3)
Chloride: 103 mmol/L (ref 98–111)
Creatinine, Ser: 1 mg/dL (ref 0.44–1.00)
GFR, Estimated: >60 mL/min
Glucose, Bld: 95 mg/dL (ref 70–99)
Potassium: 3.5 mmol/L (ref 3.5–5.1)
Sodium: 137 mmol/L (ref 135–145)

## 2021-03-29 LAB — CBC
HCT: 27.3 % — ABNORMAL LOW (ref 36.0–46.0)
Hemoglobin: 9 g/dL — ABNORMAL LOW (ref 12.0–15.0)
MCH: 31 pg (ref 26.0–34.0)
MCHC: 33 g/dL (ref 30.0–36.0)
MCV: 94.1 fL (ref 80.0–100.0)
Platelets: 367 10*3/uL (ref 150–400)
RBC: 2.9 MIL/uL — ABNORMAL LOW (ref 3.87–5.11)
RDW: 15.2 % (ref 11.5–15.5)
WBC: 6.9 10*3/uL (ref 4.0–10.5)
nRBC: 0 % (ref 0.0–0.2)

## 2021-03-29 LAB — PHOSPHORUS: Phosphorus: 4.6 mg/dL (ref 2.5–4.6)

## 2021-03-29 LAB — GLUCOSE, CAPILLARY
Glucose-Capillary: 88 mg/dL (ref 70–99)
Glucose-Capillary: 114 mg/dL — ABNORMAL HIGH (ref 70–99)
Glucose-Capillary: 76 mg/dL (ref 70–99)
Glucose-Capillary: 147 mg/dL — ABNORMAL HIGH (ref 70–99)

## 2021-03-29 LAB — MAGNESIUM: Magnesium: 2 mg/dL (ref 1.7–2.4)

## 2021-03-29 MED ORDER — MIRTAZAPINE 15 MG PO TABS
15.0000 mg | ORAL_TABLET | Freq: Every day | ORAL | Status: DC
  Administered 2021-03-29 – 2021-04-02 (×5): 15 mg via ORAL
  Filled 2021-03-29 (×5): qty 1

## 2021-03-29 MED ORDER — POTASSIUM CHLORIDE CRYS ER 20 MEQ PO TBCR
40.0000 meq | EXTENDED_RELEASE_TABLET | Freq: Once | ORAL | Status: AC
  Administered 2021-03-29: 40 meq via ORAL
  Filled 2021-03-29: qty 2

## 2021-03-29 NOTE — Progress Notes (Signed)
PROGRESS NOTE    Aerilyn Slee   UUV:[id number]  DOB: 20-Sep-1951  PCP: [facility]    DOA: 02/21/2021 LOS: 36   Brief Narrative   Desiree Madden is a 70 y.o. female with medical history significant for colonic bladder fistula, diverticulitis, hypertension, hypothyroidism, hyperlipidemia, ulcerative colitis, who presented to the hospital on 02/21/2021 because of abdominal pain.  She was admitted with acute diverticulitis.    Hospital course was complicated by perforated diverticulitis and she underwent emergent sigmoid colectomy with end colostomy and Hartman's procedure on 03/01/2021.  She was transferred to the ICU postoperatively, developed septic shock requiring multiple vasopressors.  She remained intubated on mechanical ventilation postoperatively and subsequent difficulty liberating pt from the ventilator, failed extubation on 03/06/2021 and was reintubated.   ENT was consulted and tracheostomy was performed on 03/08/2021.    Assessment & Plan   Principal Problem:   Diverticulitis Active Problems:   Fistula, bladder   Ulcerative colitis ([place])   Hyperlipidemia   Essential hypertension   Hypothyroidism   Abnormal ECG   CKD (chronic kidney disease), stage III (HCC)   Elevated lactic acid level   Protein-calorie malnutrition, severe   Septic shock, hemorrhagic shock secondary to complicated diverticulitis with perforation, pelvic abscess, colo-vesicular fistula, gangrene of the sigmoid colon: Shock physiology resolved, ICU course as outlined above.   S/p sigmoid colectomy with end colostomy, Hartman's procedure on 03/01/2021. Completed antibiotics on 03/15/2021.   --Wound VAC in place --Continue local wound care --Follow-up with general surgery for further recommendations.  Dysphagia - due to trach in place Severe malnutrition - tolerating dysphagia 1 diet and nectar thick liquids --encourage PO intake  --appreciate dietician and SLP recommendations  Acute on  chronic hypoxic respiratory failure with tracheostomy placed - Failure to wean from ventilator requiring tracheostomy.  Patient has been tolerating room air yesterday and today (4/19-20).   --please continue humidification overnight per pt and daughter request --my colleague, Dr. Mal Misty, discussed with ENT Dr. Richardson Landry re trach removal.  Want to ensure stable respiratory status and not requiring any support prior to removal.  Possible removal early next week.  Hypokalemia / Hypomagnesemia - Replaced K and Mg.  --Monitor and further replacement as needed  Non-anion gap metabolic acidosis - Improved. Mildly elevated creatinine: Encourage adequate oral hydration.  SVT, nonsustained, Sinus tachycardia, paroxysmal atrial fibrillation -  --Continue amiodarone and Eliquis.  NSTEMI, ischemic cardiomyopathy, chronic systolic and diastolic CHF - appears well compensated.  2D echo showed EF estimated at 20 to 25% and grade 1 diastolic dysfunction. --Continue Toprol-XL  Acute urinary retention - Urology repaired colo-vesicular fistula.  Cystogram was done 4/19 and there was no evidence of leak.  Foley removed. 4/21: Patient was able to spontaneously void today --appreciate urology recommendations --Hold off on replacing Foley since patient now voiding --Continue to monitor with bladder scans --in/out cath in retaining over 450 cc --Urol recommended two days antibiotics with cysto for prophylaxis, on amoxicillin -completed --monitor post-void residuals  Tracheostomy in place - follow ENT Dr. Richardson Landry.  Possible removal early next week if respiratory status remains stable.   Moderately severe depression -multifactorial.  Chart reviewed, PCP note of 01/16/2021: PHQ-9 score was 15 consistent with moderately severe depression.  This now likely complicated by all of above, having trach, prolonged hospital stay.  Patient and daughter agreeable to try on an antidepressant. --will start low-dose of Remeron  qHS tonight    (May also help with appetite and sleep) -- Close outpatient follow-up  Generalized  weakness -related to her lengthy hospital admission.  Being evaluated for CIR. -- Continue PT OT while here  Patient BMI: Body mass index is 23.41 kg/m.   DVT prophylaxis: SCDs Start: 02/21/21 2200 apixaban (ELIQUIS) tablet 5 mg   Diet:  Diet Orders (From admission, onward)    Start     Ordered   03/28/21 1433  DIET - DYS 1 Room service appropriate? Yes with Assist; Fluid consistency: Nectar Thick  Diet effective now       Comments: Just a Puree Diet. May have Oatmeal per Speech w/ butter, sugar. Yogurt, pudding, magic cup on trays. NO STRAWS.  Question Answer Comment  Room service appropriate? Yes with Assist   Fluid consistency: Nectar Thick      03/28/21 1433            Code Status: DNR    Subjective 03/29/21    Patient up in recliner when seen today.  Daughter at bedside.  Asked if any issues with her trach, she says she does not like it.  Hopefully will be taken out soon.  Denies any pain.  Does become somewhat tearful and reports being very sad, thinks related to her extensive hospital stay and all she has been through recently.  She is agreeable to try an antidepressant.   Disposition Plan & Communication   Status is: Inpatient  Remains inpatient appropriate because: Patient requires acute rehab, awaiting evaluation and insurance authorization.   Dispo: The patient is from: Home              Anticipated d/c is to: CIR              Patient currently is medically stable for discharge   Difficult to place patient No   Family Communication: Daughter at bedside on rounds this morning   Consults, Procedures, Significant Events   Consultants:   General surgery  Urology  ENT  PCCM  Procedures:   Tracheostomy 03/08/2021  Sigmoid colectomy with end colostomy, Hartman's procedure 03/01/2021  Cystorrhaphy by urology on 3/24 for repair of colovesicular  fistula  Antimicrobials:  Anti-infectives (From admission, onward)   Start     Dose/Rate Route Frequency Ordered Stop   03/27/21 2200  amoxicillin (AMOXIL) capsule 500 mg        500 mg Oral Every 8 hours 03/27/21 1401 03/29/21 1727   03/13/21 1400  meropenem (MERREM) 1 g in sodium chloride 0.9 % 100 mL IVPB        1 g 200 mL/hr over 30 Minutes Intravenous Every 8 hours 03/13/21 0855 03/16/21 2203   03/12/21 1800  meropenem (MERREM) 1 g in sodium chloride 0.9 % 100 mL IVPB  Status:  Discontinued        1 g 200 mL/hr over 30 Minutes Intravenous Every 12 hours 03/12/21 0840 03/13/21 0855   03/10/21 1000  meropenem (MERREM) 1 g in sodium chloride 0.9 % 100 mL IVPB  Status:  Discontinued        1 g 200 mL/hr over 30 Minutes Intravenous Every 8 hours 03/10/21 0854 03/12/21 0840   03/09/21 0600  fluconazole (DIFLUCAN) IVPB 200 mg  Status:  Discontinued        200 mg 100 mL/hr over 60 Minutes Intravenous Every 24 hours 03/08/21 0751 03/14/21 1014   03/08/21 2200  meropenem (MERREM) 1 g in sodium chloride 0.9 % 100 mL IVPB  Status:  Discontinued        1 g 200 mL/hr over 30 Minutes  Intravenous Every 12 hours 03/08/21 0748 03/10/21 0854   03/08/21 0600  fluconazole (DIFLUCAN) IVPB 400 mg  Status:  Discontinued        400 mg 100 mL/hr over 120 Minutes Intravenous Every 24 hours 03/07/21 1321 03/08/21 0839   03/07/21 1100  meropenem (MERREM) 1 g in sodium chloride 0.9 % 100 mL IVPB  Status:  Discontinued        1 g 200 mL/hr over 30 Minutes Intravenous Every 8 hours 03/07/21 1013 03/08/21 0748   03/07/21 1000  meropenem (MERREM) 1 g in sodium chloride 0.9 % 100 mL IVPB  Status:  Discontinued        1 g 200 mL/hr over 30 Minutes Intravenous Every 12 hours 03/06/21 2243 03/07/21 1013   03/07/21 0600  fluconazole (DIFLUCAN) IVPB 400 mg        400 mg 100 mL/hr over 120 Minutes Intravenous  Once 03/06/21 2243 03/07/21 0701   03/04/21 1800  vancomycin (VANCOREADY) IVPB 750 mg/150 mL        750  mg 150 mL/hr over 60 Minutes Intravenous Every 24 hours 03/03/21 1442 03/06/21 2040   03/03/21 0800  vancomycin (VANCOREADY) IVPB 1500 mg/300 mL  Status:  Discontinued        1,500 mg 150 mL/hr over 120 Minutes Intravenous Every 48 hours 03/02/21 1410 03/03/21 1442   03/02/21 0800  vancomycin (VANCOREADY) IVPB 750 mg/150 mL  Status:  Discontinued        750 mg 150 mL/hr over 60 Minutes Intravenous Every 24 hours 03/01/21 1954 03/02/21 1409   03/01/21 2200  ceFEPIme (MAXIPIME) 2 g in sodium chloride 0.9 % 100 mL IVPB        2 g 200 mL/hr over 30 Minutes Intravenous Every 12 hours 03/01/21 1903 03/06/21 2203   03/01/21 2100  vancomycin (VANCOREADY) IVPB 1250 mg/250 mL        1,250 mg 166.7 mL/hr over 90 Minutes Intravenous  Once 03/01/21 1954 03/02/21 0008   03/01/21 2000  metroNIDAZOLE (FLAGYL) IVPB 500 mg        500 mg 100 mL/hr over 60 Minutes Intravenous Every 8 hours 03/01/21 1903 03/06/21 2002   02/28/21 1800  piperacillin-tazobactam (ZOSYN) IVPB 3.375 g  Status:  Discontinued        3.375 g 12.5 mL/hr over 240 Minutes Intravenous Every 8 hours 02/28/21 1643 03/01/21 1903   02/28/21 1730  piperacillin-tazobactam (ZOSYN) IVPB 3.375 g  Status:  Discontinued        3.375 g 100 mL/hr over 30 Minutes Intravenous Every 8 hours 02/28/21 1635 02/28/21 1642   02/28/21 1300  Ampicillin-Sulbactam (UNASYN) 3 g in sodium chloride 0.9 % 100 mL IVPB  Status:  Discontinued        3 g 200 mL/hr over 30 Minutes Intravenous Every 6 hours 02/28/21 1156 02/28/21 1635   02/23/21 1230  piperacillin-tazobactam (ZOSYN) IVPB 3.375 g  Status:  Discontinued        3.375 g 12.5 mL/hr over 240 Minutes Intravenous Every 8 hours 02/23/21 1143 02/28/21 1155   02/22/21 0600  ciprofloxacin (CIPRO) IVPB 400 mg  Status:  Discontinued        400 mg 200 mL/hr over 60 Minutes Intravenous Every 12 hours 02/21/21 2028 02/23/21 1143   02/22/21 0400  metroNIDAZOLE (FLAGYL) IVPB 500 mg  Status:  Discontinued        500  mg 100 mL/hr over 60 Minutes Intravenous Every 8 hours 02/21/21 2028 02/23/21 1143   02/21/21 1830  ciprofloxacin (CIPRO) IVPB 400 mg        400 mg 200 mL/hr over 60 Minutes Intravenous  Once 02/21/21 1820 02/21/21 1939   02/21/21 1830  metroNIDAZOLE (FLAGYL) IVPB 500 mg        500 mg 100 mL/hr over 60 Minutes Intravenous  Once 02/21/21 1820 02/21/21 2110        Micro    Objective   Vitals:   03/29/21 0444 03/29/21 0457 03/29/21 0823 03/29/21 1219  BP: 108/65  132/67 139/72  Pulse: 73  75 85  Resp: 14  14   Temp: 98.5 F (36.9 C)  97.7 F (36.5 C) 97.9 F (36.6 C)  TempSrc: Oral     SpO2: 100%  100% 99%  Weight:  61.9 kg    Height:        Intake/Output Summary (Last 24 hours) at 03/29/2021 1731 Last data filed at 03/29/2021 1400 Gross per 24 hour  Intake 248 ml  Output 1573 ml  Net -1325 ml   Filed Weights   03/27/21 0500 03/28/21 0457 03/29/21 0457  Weight: 64 kg 64.3 kg 61.9 kg    Physical Exam:  General exam: awake, alert, no acute distress, up in recliner HEENT: Trach with PMV, speaking well, moist mucus membranes, hearing grossly normal  Respiratory system: CTAB, normal respiratory effort, on room air. Cardiovascular system: normal S1/S2, RRR, no pedal edema.   Gastrointestinal system: soft, NT, ND, left side colostomy bag in place. Central nervous system: A&O x3. no gross focal neurologic deficits, normal speech Psychiatry: Depressed mood, congruent affect, judgement and insight appear normal  Labs   Data Reviewed: I have personally reviewed following labs and imaging studies  CBC: Recent Labs  Lab 03/23/21 0440 03/26/21 0321 03/29/21 0525  WBC 6.2 6.2 6.9  HGB 8.7* 9.6* 9.0*  HCT 26.7* 29.6* 27.3*  MCV 95.0 93.7 94.1  PLT 253 381 962   Basic Metabolic Panel: Recent Labs  Lab 03/23/21 0440 03/24/21 0436 03/26/21 0321 03/26/21 0500 03/27/21 0522 03/28/21 0537 03/29/21 0525  NA 140 142 140  --  138 138 137  K 3.6 3.6 2.5*  --  4.2 3.8  3.5  CL 110 110 116*  --  105 105 103  CO2 22 24 18*  --  24 26 26   GLUCOSE 97 87 82  --  104* 109* 95  BUN 52* 46* 26*  --  37* 35* 37*  CREATININE 0.81 0.97 0.68  --  1.05* 0.99 1.00  CALCIUM 8.9 8.8* 6.5*  --  8.9 9.1 9.0  MG 2.0 2.1  --  1.4* 2.2 2.0 2.0  PHOS 3.9 5.0* 3.2  --  4.2  --  4.6   GFR: Estimated Creatinine Clearance: 45.2 mL/min (by C-G formula based on SCr of 1 mg/dL). Liver Function Tests: No results for input(s): AST, ALT, ALKPHOS, BILITOT, PROT, ALBUMIN in the last 168 hours. No results for input(s): LIPASE, AMYLASE in the last 168 hours. No results for input(s): AMMONIA in the last 168 hours. Coagulation Profile: No results for input(s): INR, PROTIME in the last 168 hours. Cardiac Enzymes: No results for input(s): CKTOTAL, CKMB, CKMBINDEX, TROPONINI in the last 168 hours. BNP (last 3 results) No results for input(s): PROBNP in the last 8760 hours. HbA1C: No results for input(s): HGBA1C in the last 72 hours. CBG: Recent Labs  Lab 03/28/21 1625 03/28/21 2216 03/29/21 0756 03/29/21 1222 03/29/21 1529  GLUCAP 130* 108* 88 114* 76   Lipid Profile: No results for  input(s): CHOL, HDL, LDLCALC, TRIG, CHOLHDL, LDLDIRECT in the last 72 hours. Thyroid Function Tests: No results for input(s): TSH, T4TOTAL, FREET4, T3FREE, THYROIDAB in the last 72 hours. Anemia Panel: No results for input(s): VITAMINB12, FOLATE, FERRITIN, TIBC, IRON, RETICCTPCT in the last 72 hours. Sepsis Labs: No results for input(s): PROCALCITON, LATICACIDVEN in the last 168 hours.  Recent Results (from the past 240 hour(s))  Resp Panel by RT-PCR (Flu A&B, Covid) Nasopharyngeal Swab     Status: None   Collection Time: 03/26/21 10:29 AM   Specimen: Nasopharyngeal Swab; Nasopharyngeal(NP) swabs in vial transport medium  Result Value Ref Range Status   SARS Coronavirus 2 by RT PCR NEGATIVE NEGATIVE Final    Comment: (NOTE) SARS-CoV-2 target nucleic acids are NOT DETECTED.  The SARS-CoV-2  RNA is generally detectable in upper respiratory specimens during the acute phase of infection. The lowest concentration of SARS-CoV-2 viral copies this assay can detect is 138 copies/mL. A negative result does not preclude SARS-Cov-2 infection and should not be used as the sole basis for treatment or other patient management decisions. A negative result may occur with  improper specimen collection/handling, submission of specimen other than nasopharyngeal swab, presence of viral mutation(s) within the areas targeted by this assay, and inadequate number of viral copies(<138 copies/mL). A negative result must be combined with clinical observations, patient history, and epidemiological information. The expected result is Negative.  Fact Sheet for Patients:  EntrepreneurPulse.com.au  Fact Sheet for Healthcare Providers:  IncredibleEmployment.be  This test is no t yet approved or cleared by the [place] FDA and  has been authorized for detection and/or diagnosis of SARS-CoV-2 by FDA under an Emergency Use Authorization (EUA). This EUA will remain  in effect (meaning this test can be used) for the duration of the COVID-19 declaration under Section 564(b)(1) of the Act, 21 U.S.C.section 360bbb-3(b)(1), unless the authorization is terminated  or revoked sooner.       Influenza A by PCR NEGATIVE NEGATIVE Final   Influenza B by PCR NEGATIVE NEGATIVE Final    Comment: (NOTE) The Xpert Xpress SARS-CoV-2/FLU/RSV plus assay is intended as an aid in the diagnosis of influenza from Nasopharyngeal swab specimens and should not be used as a sole basis for treatment. Nasal washings and aspirates are unacceptable for Xpert Xpress SARS-CoV-2/FLU/RSV testing.  Fact Sheet for Patients: EntrepreneurPulse.com.au  Fact Sheet for Healthcare Providers: IncredibleEmployment.be  This test is not yet approved or cleared by the  [place] FDA and has been authorized for detection and/or diagnosis of SARS-CoV-2 by FDA under an Emergency Use Authorization (EUA). This EUA will remain in effect (meaning this test can be used) for the duration of the COVID-19 declaration under Section 564(b)(1) of the Act, 21 U.S.C. section 360bbb-3(b)(1), unless the authorization is terminated or revoked.  Performed at [facility], [address], [place], [place] [address]       Imaging Studies   No results found.   Medications   Scheduled Meds: . amiodarone  200 mg Oral BID  . apixaban  5 mg Oral BID  . vitamin C  500 mg Oral BID  . chlorhexidine gluconate (MEDLINE KIT)  15 mL Mouth Rinse BID  . Chlorhexidine Gluconate Cloth  6 each Topical Daily  . docusate  100 mg Oral BID  . feeding supplement (NEPRO CARB STEADY)  237 mL Oral TID BM  . insulin aspart  0-5 Units Subcutaneous QHS  . insulin aspart  0-9 Units Subcutaneous TID WC  . levothyroxine  75 mcg Oral Q0600  . mouth rinse  15 mL Mouth Rinse 10 times per day  . metoprolol succinate  12.5 mg Oral Daily  . mirtazapine  15 mg Oral QHS  . multivitamin with minerals  1 tablet Oral Daily  . pantoprazole  40 mg Oral Daily  . polyethylene glycol  17 g Oral Daily  . sodium chloride flush  10-40 mL Intracatheter Q12H   Continuous Infusions: . sodium chloride Stopped (03/20/21 1026)       LOS: 36 days    Time spent: 30 minutes with greater than 50% spent at bedside and in coordination of care    Ezekiel Slocumb, DO [facility]  03/29/2021, 5:31 PM      If 7PM-7AM, please contact night-coverage. How to contact the [facility] Attending or Consulting provider [place] or covering provider during after hours [place], for this patient?    1. Check the care team in [facility] and look for a) attending/consulting TR[facility] provider listed and b) the [facility] team listed 2. Log into [email or web address] and use [place]'s universal password to access. If you do not  have the password, please contact the hospital operator. 3. Locate the [facility] provider you are looking for under [facility] and page to a number that you can be directly reached. 4. If you still have difficulty reaching the provider, please page the [facility] (Director on Call) for the Hospitalists listed on amion for assistance.

## 2021-03-29 NOTE — Plan of Care (Signed)
Shift Summary: Pt orientedx3, poor attention span and forgetful. Placed on humidified trach collar per orders but pt frequently removes. VSS. C/o pain addressed with PRN oxycodone x1; PRN xanax given x1 for anxiety. NPWT unit functioning w/o issue overnight. Minimal ostomy output but burped several times. After much education, pt allowed ortho boots to be placed back on feet. I/O cath x1 at beginning of shift for bladder scan of 721ml with I/O output of 975ml clear urine. Bladder scan in AM only 241ml. PMSV worn for all PO intake, did find inner cannula of trach lying in bed at start of shift; pt stated she did not remove inner cannula and unclear how it was displaced; new inner cannula placed w/o issue. Did not eat dinner tray despite multiple attempts; did eat an applesauce and drink a nectar thick water with all medications. Fall/safety precautions in place, rounding performed, needs/concerns addressed during shift.   Problem: Education: Goal: Knowledge of General Education information will improve Description: Including pain rating scale, medication(s)/side effects and non-pharmacologic comfort measures Outcome: Progressing   Problem: Health Behavior/Discharge Planning: Goal: Ability to manage health-related needs will improve Outcome: Progressing   Problem: Clinical Measurements: Goal: Ability to maintain clinical measurements within normal limits will improve Outcome: Progressing Goal: Will remain free from infection Outcome: Progressing Goal: Diagnostic test results will improve Outcome: Progressing Goal: Respiratory complications will improve Outcome: Progressing Goal: Cardiovascular complication will be avoided Outcome: Progressing   Problem: Activity: Goal: Risk for activity intolerance will decrease Outcome: Progressing   Problem: Nutrition: Goal: Adequate nutrition will be maintained Outcome: Progressing   Problem: Coping: Goal: Level of anxiety will decrease Outcome:  Progressing   Problem: Elimination: Goal: Will not experience complications related to bowel motility Outcome: Progressing Goal: Will not experience complications related to urinary retention Outcome: Progressing   Problem: Pain Managment: Goal: General experience of comfort will improve Outcome: Progressing   Problem: Safety: Goal: Ability to remain free from injury will improve Outcome: Progressing   Problem: Skin Integrity: Goal: Risk for impaired skin integrity will decrease Outcome: Progressing   Problem: Education: Goal: Knowledge about tracheostomy care/management will improve Outcome: Progressing   Problem: Activity: Goal: Ability to tolerate increased activity will improve Outcome: Progressing   Problem: Health Behavior/Discharge Planning: Goal: Ability to manage tracheostomy will improve Outcome: Progressing   Problem: Respiratory: Goal: Patent airway maintenance will improve Outcome: Progressing   Problem: Role Relationship: Goal: Ability to communicate will improve Outcome: Progressing   Problem: Education: Goal: Knowledge of ostomy care will improve Outcome: Progressing Goal: Understanding of discharge needs will improve Outcome: Progressing   Problem: Bowel/Gastric/Urinary: Goal: Gastrointestinal status for postoperative course will improve Outcome: Progressing   Problem: Coping: Goal: Coping ability will improve Outcome: Progressing   Problem: Fluid Volume: Goal: Ability to achieve a balanced intake and output will improve Outcome: Progressing   Problem: Health Behavior/Discharge Planning: Goal: Ability to manage health-related needs will improve Outcome: Progressing   Problem: Nutrition: Goal: Will attain and maintain optimal nutritional status will improve Outcome: Progressing   Problem: Clinical Measurements: Goal: Postoperative complications will be avoided or minimized Outcome: Progressing   Problem: Skin Integrity: Goal: Will  show signs of wound healing Outcome: Progressing Goal: Risk for impaired skin integrity will decrease Outcome: Progressing

## 2021-03-29 NOTE — Progress Notes (Signed)
Nutrition Follow-up  DOCUMENTATION CODES:   Severe malnutrition in context of acute illness/injury  INTERVENTION:   Continue Nepro Shake po TID, each supplement provides 425 kcal and 19 grams protein  Continue Magic cup TID with meals, each supplement provides 290 kcal and 9 grams of protein  Continue MVI po daily   Continue Vitamin C $RemoveB'500mg'LziSjRRS$  po BID   72 hour calorie count in progress   NUTRITION DIAGNOSIS:   Severe Malnutrition related to acute illness as evidenced by moderate fat depletion,moderate muscle depletion,severe muscle depletion. -ongoing   GOAL:   Patient will meet greater than or equal to 90% of their needs  -progressing   MONITOR:   PO intake,Supplement acceptance,Labs,Weight trends,Skin,I & O's  ASSESSMENT:   70 y.o. female with h/o HTN, CKD III, colovesical fistula, hypothroidism and HLD who is admitted with diverticulitis and constipation.   -Pt s/p sigmoid colectomy with end colostomy, Hartman's procedure, splenic flexure mobilization and fistula repair 3/24 -Pt s/p tracheostomy and NGT 3/31 -Pt s/p ostomy revision and maturation 4/1  Pt remains on a dysphagia 1/nectar thick diet. SLP evaluated patient yesterday but pt was unable to have her diet advanced. 72 hour calorie count in progress. Pt is being evaluated for the possible need for G-tube placement. Pt currently meeting about 30-60% of the lower end of her estimated needs via oral intake with the majority of her calories and protein coming from supplements (supplements providing 86% of her calorie needs and 97% of her protein needs). Patient herself is a poor historian and is unable to provide any detailed history but reports that she is ready to drink her protein and go to rehab. Daughter at bedside reports that patient is eating better and that today is the best she has eaten so far. Spoke with RN who also reports that patient is starting to eat better. Will plan to continue calorie count for one more  day; daughter is aware that patient is being evaluated for G-tube. Per chart, pt is up ~6lbs since admit; this is likely r/t fluid changes and varying bed scales. Pt has developed severe malnutrition in hospital. Pt currently has wound VAC in place.   Medications reviewed and include: vitamin C, colace, insulin, synthroid, MVI, protonix, miralax, KCl  Labs reviewed: Na 137 wnl, K 3.5 wnl, BUN 37(H), P 4.6 wnl, Mg 2.0 wnl Hgb 9.0(L), Hct 27.3(L) cbgs- 88, 114, 76 x 24 hrs  Diet Order:    Diet Order            DIET - DYS 1 Room service appropriate? Yes with Assist; Fluid consistency: Nectar Thick  Diet effective now                EDUCATION NEEDS:   Not appropriate for education at this time  Skin:  Incisions: Midline wound 15X8X7cm with VAC  Last BM:  4/20- type 7  Height:   Ht Readings from Last 1 Encounters:  03/14/21 5' 4.02" (1.626 m)    Weight:   Wt Readings from Last 1 Encounters:  03/29/21 61.9 kg    Ideal Body Weight:  54.5 kg  BMI:  Body mass index is 23.41 kg/m.  Estimated Nutritional Needs:   Kcal:  1700-1900kcal/day  Protein:  100-115g/day  Fluid:  1.4-1.7L/day  Koleen Distance MS, RD, LDN Please refer to [facility] for RD and/or RD on-call/weekend/after hours pager

## 2021-03-29 NOTE — Progress Notes (Signed)
Calorie Count Day 2  Estimated Nutritional Needs:   Kcal:  1700-1900kcal/day Protein:  100-115g/day Fluid:  1.4-1.7L/day  Lunch 4/20: 2 bites pancake, 4 bites (2 tsp) tuna salad, 2 juices  Supplements- 1 Nepro supplement  Total- 558kcal/day, 19g/day protein   Dinner 4/20: 1 applesauce, refused dinner   Supplements- 1 1/2 tablespoons Magic Cup with meds  Total- 75kcal and 1g protein   Breakfast 4/21: not documented  Supplements: 1 Nepro supplement- 425kcal and 19g protein   Total- 420kcal and 19g protein   Total Intake:  978kcal (58% estimated needs) and  39g protein ( 39% estimated needs)  Koleen Distance MS, RD, LDN Please refer to [facility] for RD and/or RD on-call/weekend/after hours pager

## 2021-03-29 NOTE — TOC Progression Note (Signed)
Transition of Care [facility]) - Progression Note    Patient Details  Name: Ariadne Rissmiller MRN: [id number] Date of Birth: [birth year]  Transition of Care [facility]) CM/SW Contact  Shelbie Hutching, RN Phone Number: 03/29/2021, 4:16 PM  Clinical Narrative:    Patient has been accepted by CIR.  CIR Case Manager will start insurance authorization through [place].    Expected Discharge Plan: IP Rehab Facility Barriers to Discharge: Other (comment)  Expected Discharge Plan and Services Expected Discharge Plan: [place]   Discharge Planning Services: CM Consult Post Acute Care Choice: IP Rehab                   DME Arranged: N/A DME Agency: NA       HH Arranged: NA           Social Determinants of Health (SDOH) Interventions    Readmission Risk Interventions No flowsheet data found.

## 2021-03-29 NOTE — Progress Notes (Signed)
Physical Therapy Treatment Patient Details Name: Desiree Madden MRN: [id number] DOB: [birth year] Today's Date: 03/29/2021    History of Present Illness Desiree Madden is a 70 y.o. female with medical history significant for colonic bladder fistula, diverticulitis, hypertension, hypothyroidism, hyperlipidemia, ulcerative colitis, who presented to the hospital on 02/21/2021 because of abdominal pain.  She was admitted to the hospital for acute diverticulitis.  Hospital course was complicated by perforated diverticulitis and she underwent emergent sigmoid colectomy with end colostomy and Hartman's procedure on 03/01/2021.  She was transferred to the ICU postoperatively.  She developed septic shock requiring multiple vasopressors.  She remained intubated on mechanical ventilation postoperatively.  She could not be liberated from the ventilator.  She failed extubation on 03/06/2021 and was reintubated.  ENT was consulted and tracheostomy was performed on 03/08/2021.   Ultimately extubated 4/8.    PT Comments    Pt was long sitting in bed upon arriving. Agrees to PT session and is cooperative. Pt much more motivated/cooperative today. She demonstrates continued improved strength and abilities. Was able to exit bed with mod assist of one. Stood to RW from slightly elevated bed height with min assist. Required mod assist to stand from lower recliner surface height. She ambulated with RW 1 x 5 ft and 1 x 15 ft with recliner  Follow for safety. Min-mod assist to ambulate for safety. Pt fatigued quickly but continues to demonstrate improved abilities.  Pt will need AFO for LLE to improve gait safety/stability however was able to advance LLE without LOB. Acute PT will continue to follow and progress as able per POC. Highly recommend CIR at DC to address deficits while maximizing independence with ADLs. Pt has supportive daughter that is willing to assist 24/7 at DC.    Follow Up Recommendations  CIR     Equipment  Recommendations  Other (comment) (defer to next level of care)       Precautions / Restrictions Precautions Precautions: Fall Precaution Comments: wound vac/trach/PMV Restrictions Weight Bearing Restrictions: No    Mobility  Bed Mobility Overal bed mobility: Needs Assistance Bed Mobility: Supine to Sit Rolling: Min assist;Mod assist Sidelying to sit: Mod assist Supine to sit: Mod assist     General bed mobility comments: Pt continues to demonstrate improving strength and mobility. required mod assist to exit R side of bed    Transfers Overall transfer level: Needs assistance Equipment used: Rolling walker (2 wheeled) Transfers: Sit to/from Stand Sit to Stand: Min assist;Mod assist         General transfer comment: Pt was able to stand from EOB surface and from recliner. min assist form slightly elevated bed height. MOd assist from lower recliner surface height.  Ambulation/Gait Ambulation/Gait assistance: Min assist;Mod assist Gait Distance (Feet): 15 Feet Assistive device: Rolling walker (2 wheeled) Gait Pattern/deviations: Step-to pattern Gait velocity: decreased   General Gait Details: Pt was able to ambulate 1 x 5 ft then 1 x 15 ft with RW.       Balance Overall balance assessment: Needs assistance Sitting-balance support: Feet supported Sitting balance-Leahy Scale: Fair     Standing balance support: Bilateral upper extremity supported;During functional activity Standing balance-Leahy Scale: Poor Standing balance comment: reliant on BUE support on RW and min assist to maintain balance       Cognition Arousal/Alertness: Awake/alert Behavior During Therapy: WFL for tasks assessed/performed Overall Cognitive Status: Impaired/Different from baseline Area of Impairment: Orientation;Following commands;Safety/judgement;Problem solving;Awareness;Attention      Orientation Level: Disoriented to;Time;Situation  Following Commands: Follows one step  commands with increased time;Follows one step commands consistently;Follows multi-step commands inconsistently Safety/Judgement: Decreased awareness of safety;Decreased awareness of deficits Awareness: Intellectual Problem Solving: Difficulty sequencing;Requires verbal cues;Requires tactile cues General Comments: Pt is A and O x 2. much more cooperative and motivated             Pertinent Vitals/Pain Pain Assessment: 0-10 Pain Score: 4  Faces Pain Scale: Hurts a little bit Pain Location: abd with repositioning Pain Descriptors / Indicators: Grimacing Pain Intervention(s): Limited activity within patient's tolerance;Monitored during session;Premedicated before session           PT Goals (current goals can now be found in the care plan section) Acute Rehab PT Goals Patient Stated Goal: to get better Progress towards PT goals: Progressing toward goals    Frequency    Min 2X/week      PT Plan Current plan remains appropriate    Co-evaluation     PT goals addressed during session: Mobility/safety with mobility;Balance;Proper use of DME;Strengthening/ROM        AM-PAC PT "6 Clicks" Mobility   Outcome Measure  Help needed turning from your back to your side while in a flat bed without using bedrails?: A Little Help needed moving from lying on your back to sitting on the side of a flat bed without using bedrails?: A Lot Help needed moving to and from a bed to a chair (including a wheelchair)?: A Lot Help needed standing up from a chair using your arms (e.g., wheelchair or bedside chair)?: A Lot Help needed to walk in hospital room?: A Lot Help needed climbing 3-5 steps with a railing? : A Lot 6 Click Score: 13    End of Session Equipment Utilized During Treatment: Gait belt Activity Tolerance: Patient tolerated treatment well;Patient limited by fatigue Patient left: in chair;with call bell/phone within reach;with chair alarm set;with family/visitor present Nurse  Communication: Mobility status PT Visit Diagnosis: Muscle weakness (generalized) (M62.81);Unsteadiness on feet (R26.81);Other abnormalities of gait and mobility (R26.89)     Time: 1224-4975 PT Time Calculation (min) (ACUTE ONLY): 24 min  Charges:  $Gait Training: 8-22 mins $Therapeutic Activity: 8-22 mins                     Julaine Fusi PTA 03/29/21, 1:09 PM

## 2021-03-29 NOTE — Progress Notes (Addendum)
Inpatient Rehabilitation Admissions Coordinator                             Inpatient rehab consult received. I met with patient and her daughter at bedside. We discussed goals and expectations of a possible CIR admit. Patient would be a good candidate for CIR pending [employer] Medicare approval. I will begin insurance authorization for possible admit early next week. They are in agreement to plan.  Danne Baxter, RN, MSN Rehab Admissions Coordinator [phone] 03/29/2021 3:50 PM

## 2021-03-29 NOTE — TOC Progression Note (Signed)
Transition of Care [facility]) - Progression Note    Patient Details  Name: Desiree Madden MRN: [id number] Date of Birth: 13-Sep-1951  Transition of Care [facility]) CM/SW Contact  Anselm Pancoast, RN Phone Number: 03/29/2021, 9:32 AM  Clinical Narrative:    Ebony Hail @ [facility] reports they are willing to look at patient once decannulated. Currently ENT reports considering decannulation early next week if stable on RA.    Expected Discharge Plan: IP Rehab Facility Barriers to Discharge: Other (comment)  Expected Discharge Plan and Services Expected Discharge Plan: [place]   Discharge Planning Services: CM Consult Post Acute Care Choice: IP Rehab                   DME Arranged: N/A DME Agency: NA       HH Arranged: NA           Social Determinants of Health (SDOH) Interventions    Readmission Risk Interventions No flowsheet data found.

## 2021-03-29 NOTE — Progress Notes (Signed)
[place] for Electrolyte Monitoring and Replacement   Recent Labs: Potassium (mmol/L)  Date Value  03/29/2021 3.5   Magnesium (mg/dL)  Date Value  03/29/2021 2.0   Calcium (mg/dL)  Date Value  03/29/2021 9.0   Albumin (g/dL)  Date Value  03/16/2021 2.4 (L)   Phosphorus (mg/dL)  Date Value  03/29/2021 4.6   Sodium (mmol/L)  Date Value  03/29/2021 137   Assessment: Desiree Madden is a 70 y.o. female admitted on 02/21/2021 with intra-abdominal infection (diverticulitis w/ fecal perforation). Patient septic with acute diverticulitis abdominal abscess with colovesical fistula and enterococcus urinary tract infection which was further complicated by the development of complicated diverticulitis with perforation and gangrenous changes requiring Hartman's procedure performed on 03/01/21. PMH includes CAD s/p CABG (2015), HTN, hypothyroidism, colon bladder fistula, diverticulitis, and ulcerative colitis. Pharmacy was consulted for electrolyte monitoring.  Nutrition: Tube feeds stopped. SLP has cleared patient for dysphagia 1 diet  Goal of Therapy:  K 4.0 - 5.1 mmol/L Mg 2.0 - 2.4 mg/dL All Other Electrolytes WNL  Plan:   40 mEq oral KCl  Re-check electrolytes with AM labs  Vallery Sa, PhamD  Clinical Pharmacist 03/29/2021

## 2021-03-30 ENCOUNTER — Telehealth: Payer: Self-pay | Admitting: Physician Assistant

## 2021-03-30 DIAGNOSIS — I10 Essential (primary) hypertension: Secondary | ICD-10-CM | POA: Diagnosis not present

## 2021-03-30 DIAGNOSIS — K5792 Diverticulitis of intestine, part unspecified, without perforation or abscess without bleeding: Secondary | ICD-10-CM | POA: Diagnosis not present

## 2021-03-30 DIAGNOSIS — E43 Unspecified severe protein-calorie malnutrition: Secondary | ICD-10-CM | POA: Diagnosis not present

## 2021-03-30 DIAGNOSIS — N322 Vesical fistula, not elsewhere classified: Secondary | ICD-10-CM | POA: Diagnosis not present

## 2021-03-30 LAB — BASIC METABOLIC PANEL
Anion gap: 9 (ref 5–15)
BUN: 32 mg/dL — ABNORMAL HIGH (ref 8–23)
CO2: 25 mmol/L (ref 22–32)
Calcium: 9 mg/dL (ref 8.9–10.3)
Chloride: 103 mmol/L (ref 98–111)
Creatinine, Ser: 0.97 mg/dL (ref 0.44–1.00)
GFR, Estimated: >60 mL/min (ref >60)
Glucose, Bld: 89 mg/dL (ref 70–99)
Potassium: 3.8 mmol/L (ref 3.5–5.1)
Sodium: 137 mmol/L (ref 135–145)

## 2021-03-30 LAB — GLUCOSE, CAPILLARY
Glucose-Capillary: 81 mg/dL (ref 70–99)
Glucose-Capillary: 114 mg/dL — ABNORMAL HIGH (ref 70–99)
Glucose-Capillary: 129 mg/dL — ABNORMAL HIGH (ref 70–99)
Glucose-Capillary: 66 mg/dL — ABNORMAL LOW (ref 70–99)
Glucose-Capillary: 75 mg/dL (ref 70–99)
Glucose-Capillary: 83 mg/dL (ref 70–99)

## 2021-03-30 LAB — MAGNESIUM: Magnesium: 1.8 mg/dL (ref 1.7–2.4)

## 2021-03-30 MED ORDER — ORAL CARE MOUTH RINSE
15.0000 mL | Freq: Two times a day (BID) | OROMUCOSAL | Status: DC
  Administered 2021-03-30 – 2021-04-03 (×8): 15 mL via OROMUCOSAL

## 2021-03-30 MED ORDER — MAGNESIUM SULFATE 2 GM/50ML IV SOLN
2.0000 g | Freq: Once | INTRAVENOUS | Status: AC
  Administered 2021-03-30: 2 g via INTRAVENOUS
  Filled 2021-03-30: qty 50

## 2021-03-30 MED ORDER — POTASSIUM CHLORIDE 20 MEQ PO PACK
20.0000 meq | PACK | Freq: Once | ORAL | Status: AC
  Administered 2021-03-30: 20 meq via ORAL
  Filled 2021-03-30: qty 1

## 2021-03-30 NOTE — PMR Pre-admission (Shared)
PMR Admission Coordinator Pre-Admission Assessment  Patient: Desiree Madden is an 70 y.o., female MRN: [id number] DOB: 06-Apr-1951 Height: 5' 4.02" (162.6 cm) Weight: 58.5 kg  Insurance Information HMO: yes    PPO:      PCP:      IPA:      80/20:      OTHER:  PRIMARY: [employer]      Policy#: [id number]      Subscriber: pt CM Name: ***      Phone#: ***     Fax#: *** Pre-Cert#: ***      Employer: *** Benefits:  Phone #: ***     Name: *** Irene Shipper. Date: ***     Deduct: ***      Out of Pocket Max: ***      Life Max: *** CIR: ***      SNF: *** Outpatient: ***     Co-Pay: *** Home Health: ***      Co-Pay: *** DME: ***     Co-Pay: *** Providers: ***  SECONDARY: none      Policy#:      Phone#:   Financial Counselor:       Phone#:   The "Data Collection Information Summary" for patients in Inpatient Rehabilitation Facilities with attached "Privacy Act [place] Records" was provided and verbally reviewed with: Patient and Family  Emergency Contact Information Contact Information    Name Relation Home Work Mobile   brunquell,jessica Daughter   [phone]   [place] Grandchild   [phone]      Current Medical History  Patient Admitting Diagnosis: Debility  History of Present Illness: 70 year old female with medical history significant for colon-bladder fistula and diverticulitis, hypertension, hypothyroidism, HLD, ulcerative colitis.  Presented to [facility] on [facility]/21/2021 with abdominal pain. Admitted with acute diverticulitis. Hospital course complicated by perforated diverticulitis and she underwent emergent sigmoid colectomy and end colostomy and Hartman's procedure on 03/01/2021.  Transferred to ICU postoperatively, developed septic shock requiring multiple vasopressors. She remained intubated  And eventually required ENT consultation nd trach placed on 03/08/2021. Wound Vac to open surgical wound to abdomen. Tolerating dysphagia diet 1 and nectar thick liquids due to  dysphagia. Tolerating room air and ENT to follow up next week for possible decannulation of trach. NSTEMI, ischemic cardiomyopathy chronic systolic and diastolic;ic CHF well compensated. ED at 20 to 25 % and grade 1 diastolic dysfunction , continue Toprol XL.  Urology repaired colo-vesicular fistula. Cystogram on 4/19 with no evidence of leak. Foley removed. Continue to monitor with bladder scans and to I and O cath if retaining over 450 cc. Monitor post void residuals.    Patient's medical record from [facility] has been reviewed by the rehabilitation admission coordinator and physician.  Past Medical History  Past Medical History:  Diagnosis Date  . Hypertension   . Thyroid disease     Family History   family history includes Arthritis in an other family member; CAD in her mother.  Prior Rehab/Hospitalizations Has the patient had prior rehab or hospitalizations prior to admission? Yes  Has the patient had major surgery during 100 days prior to admission? Yes   Current Medications  Current Facility-Administered Medications:  .  0.9 %  sodium chloride infusion, 250 mL, Intravenous, Continuous, Jennye Boroughs, MD, Stopped at 03/20/21 1026 .  ALPRAZolam Duanne Moron) tablet 0.25 mg, 0.25 mg, Oral, BID PRN, Jennye Boroughs, MD, 0.25 mg at 03/29/21 2001 .  amiodarone (PACERONE) tablet 200 mg, 200 mg, Oral, BID, Jennye Boroughs, MD, 200  mg at 03/30/21 0835 .  apixaban (ELIQUIS) tablet 5 mg, 5 mg, Oral, BID, Jennye Boroughs, MD, 5 mg at 03/30/21 0835 .  ascorbic acid (VITAMIN C) tablet 500 mg, 500 mg, Oral, BID, Jennye Boroughs, MD, 500 mg at 03/30/21 0835 .  chlorhexidine gluconate (MEDLINE KIT) (PERIDEX) 0.12 % solution 15 mL, 15 mL, Mouth Rinse, BID, Jennye Boroughs, MD, 15 mL at 03/30/21 0835 .  Chlorhexidine Gluconate Cloth 2 % PADS 6 each, 6 each, Topical, Daily, Jennye Boroughs, MD, 6 each at 03/29/21 2002 .  docusate (COLACE) 50 MG/5ML liquid 100 mg, 100 mg, Oral, BID, Jennye Boroughs, MD, 100 mg at  03/30/21 0834 .  feeding supplement (NEPRO CARB STEADY) liquid 237 mL, 237 mL, Oral, TID BM, Jennye Boroughs, MD, 237 mL at 03/30/21 0836 .  HYDROmorphone (DILAUDID) injection 0.5 mg, 0.5 mg, Intravenous, Q3H PRN, Jennye Boroughs, MD, 0.5 mg at 03/28/21 0859 .  insulin aspart (novoLOG) injection 0-5 Units, 0-5 Units, Subcutaneous, QHS, Ayiku, Bernard, MD .  insulin aspart (novoLOG) injection 0-9 Units, 0-9 Units, Subcutaneous, TID WC, Jennye Boroughs, MD, 1 Units at 03/28/21 1655 .  ipratropium-albuterol (DUONEB) 0.5-2.5 (3) MG/3ML nebulizer solution 3 mL, 3 mL, Nebulization, Q4H PRN, Jennye Boroughs, MD .  levothyroxine (SYNTHROID) tablet 75 mcg, 75 mcg, Oral, Q0600, Jennye Boroughs, MD, 75 mcg at 03/30/21 0457 .  lip balm (BLISTEX) ointment, , Topical, PRN, Jennye Boroughs, MD, Given at 03/18/21 [phone] .  magnesium sulfate IVPB 2 g 50 mL, 2 g, Intravenous, Once, Ezekiel Slocumb, DO, Last Rate: 50 mL/hr at 03/30/21 0844, 2 g at 03/30/21 0844 .  MEDLINE mouth rinse, 15 mL, Mouth Rinse, BID, Nicole Kindred A, DO .  metoprolol succinate (TOPROL-XL) 24 hr tablet 12.5 mg, 12.5 mg, Oral, Daily, Jennye Boroughs, MD, 12.5 mg at 03/30/21 0835 .  mirtazapine (REMERON) tablet 15 mg, 15 mg, Oral, QHS, Nicole Kindred A, DO, 15 mg at 03/29/21 2002 .  multivitamin with minerals tablet 1 tablet, 1 tablet, Oral, Daily, Jennye Boroughs, MD, 1 tablet at 03/30/21 [phone] .  ondansetron (ZOFRAN) tablet 4 mg, 4 mg, Oral, Q6H PRN **OR** ondansetron (ZOFRAN) injection 4 mg, 4 mg, Intravenous, Q6H PRN, Jennye Boroughs, MD .  oxyCODONE (Oxy IR/ROXICODONE) immediate release tablet 5 mg, 5 mg, Oral, Q6H PRN, Jennye Boroughs, MD, 5 mg at 03/30/21 0457 .  pantoprazole (PROTONIX) EC tablet 40 mg, 40 mg, Oral, Daily, Jennye Boroughs, MD, 40 mg at 03/30/21 0835 .  polyethylene glycol (MIRALAX / GLYCOLAX) packet 17 g, 17 g, Oral, Daily, Jennye Boroughs, MD, 17 g at 03/30/21 1856 .  sodium chloride flush (NS) 0.9 % injection 10-40 mL, 10-40 mL,  Intracatheter, Q12H, Jennye Boroughs, MD, 10 mL at 03/30/21 0836 .  sodium chloride flush (NS) 0.9 % injection 10-40 mL, 10-40 mL, Intracatheter, PRN, Jennye Boroughs, MD  Patients Current Diet:  Diet Order            DIET - DYS 1 Room service appropriate? Yes with Assist; Fluid consistency: Nectar Thick  Diet effective now                 Precautions / Restrictions Precautions Precautions: Fall Precaution Comments: wound vac/trach/PMV Restrictions Weight Bearing Restrictions: No   Has the patient had 2 or more falls or a fall with injury in the past year? No  Prior Activity Level Community (5-7x/wk): independent, driving and working in Leisure centre manager  Prior Functional Level Self Care: Did the patient need help bathing, dressing, using the toilet  or eating? Independent  Indoor Mobility: Did the patient need assistance with walking from room to room (with or without device)? Independent  Stairs: Did the patient need assistance with internal or external stairs (with or without device)? Independent  Functional Cognition: Did the patient need help planning regular tasks such as shopping or remembering to take medications? Independent  Home Assistive Devices / Equipment Home Assistive Devices/Equipment: None Home Equipment: None  Prior Device Use: Indicate devices/aids used by the patient prior to current illness, exacerbation or injury? None of the above  Current Functional Level Cognition  Overall Cognitive Status: Impaired/Different from baseline Difficult to assess due to: Tracheostomy Orientation Level: Oriented to person,Oriented to time,Oriented to place,Disoriented to situation Following Commands: Follows one step commands with increased time,Follows one step commands consistently,Follows multi-step commands inconsistently Safety/Judgement: Decreased awareness of safety,Decreased awareness of deficits General Comments: Pt is A and O x 2. much more cooperative and  motivated    Extremity Assessment (includes Sensation/Coordination)  Upper Extremity Assessment: Generalized weakness  Lower Extremity Assessment: Generalized weakness,LLE deficits/detail LLE Deficits / Details: noted limited dorsiflexion, CNA notified and encouraged to stretch and pass on in report.    ADLs  Overall ADL's : Needs assistance/impaired Grooming: Oral care,Set up,Supervision/safety Grooming Details (indicate cue type and reason): use of oral care with min cuing for thoroughness Lower Body Bathing: Moderate assistance Lower Body Bathing Details (indicate cue type and reason): assist to come into figure four position and min guard for balance while leaning forward General ADL Comments: min guard for sitting balance for pt to wash hair while seated on EOB. Pt performs oral care with set up A and min cuing for thoroughness.    Mobility  Overal bed mobility: Needs Assistance Bed Mobility: Supine to Sit Rolling: Min assist,Mod assist Sidelying to sit: Mod assist Supine to sit: Mod assist Sit to supine: Max assist Sit to sidelying: Max assist General bed mobility comments: Pt continues to demonstrate improving strength and mobility. required mod assist to exit R side of bed    Transfers  Overall transfer level: Needs assistance Equipment used: Rolling walker (2 wheeled) Transfers: Sit to/from Stand Sit to Stand: Min assist,Mod assist Stand pivot transfers: Max assist General transfer comment: Pt was able to stand from EOB surface and from recliner. min assist form slightly elevated bed height. MOd assist from lower recliner surface height.    Ambulation / Gait / Stairs / Wheelchair Mobility  Ambulation/Gait Ambulation/Gait assistance: Min assist,Mod Web designer (Feet): 15 Feet Assistive device: Rolling walker (2 wheeled) Gait Pattern/deviations: Step-to pattern General Gait Details: Pt was able to ambulate 1 x 5 ft then 1 x 15 ft with RW. Gait velocity:  decreased Stairs:  (deferred secondary to abdominal discomfort)    Posture / Balance Dynamic Sitting Balance Sitting balance - Comments: pt sat EOB x ~ 5 minutes with supervision only. Did endorse fatigue Balance Overall balance assessment: Needs assistance Sitting-balance support: Feet supported Sitting balance-Leahy Scale: Fair Sitting balance - Comments: pt sat EOB x ~ 5 minutes with supervision only. Did endorse fatigue Standing balance support: Bilateral upper extremity supported,During functional activity Standing balance-Leahy Scale: Poor Standing balance comment: reliant on BUE support on RW and min assist to maintain balance    Special needs/care consideration New trach this admit # 6 cuffed and using PMSV New colostomy this admit   Previous Home Environment  Living Arrangements: Spouse/significant other (daughter lives in in Museum/gallery curator)  Lives With: Spouse,Daughter Available Help at Discharge: [place]  24 hours/day (daughter works remote from home) Type of Home: [place]: Two level,Able to live on main level with bedroom/bathroom Home Access: Stairs to enter [employer] of Steps: 3 Bathroom Shower/Tub: Chiropodist: Standard Bathroom Accessibility: Yes How Accessible: Accessible via walker [place]: No  Discharge Living Setting Plans for Discharge Living Setting: Patient's home,Lives with (comment) Type of Home at Discharge: House Discharge Home Layout: Two level,Able to live on main level with bedroom/bathroom Discharge Home Access: Stairs to enter Entrance Stairs-Rails: None Entrance Stairs-Number of Steps: 3 Discharge Bathroom Shower/Tub: Tub/shower unit Discharge Bathroom Toilet: Standard Discharge Bathroom Accessibility: Yes How Accessible: Accessible via walker Does the patient have any problems obtaining your medications?: No  Social/Family/Support Systems Contact Information: daughter,  [name] Anticipated Caregiver: daughter and spouse, Mitzi Hansen Anticipated Ambulance person Information: see above Caregiver Availability: 24/7 Discharge Plan Discussed with Primary Caregiver: Yes Is Caregiver In Agreement with Plan?: Yes Does Caregiver/Family have Issues with Lodging/Transportation while Pt is in Rehab?: No  Goals Patient/Family Goal for Rehab: supervision to min assist with PT, OT and SLP Expected length of stay: ELOS 2 weeks Pt/Family Agrees to Admission and willing to participate: Yes Program Orientation Provided & Reviewed with Pt/Caregiver Including Roles  & Responsibilities: Yes  Decrease burden of Care through IP rehab admission: n/a  Possible need for SNF placement upon discharge: not anticipated  Patient Condition: I have reviewed medical records from [facility], spoken with CM, and patient and daughter. I met with patient at the bedside for inpatient rehabilitation assessment.  Patient will benefit from ongoing PT, OT and SLP, can actively participate in 3 hours of therapy a day 5 days of the week, and can make measurable gains during the admission.  Patient will also benefit from the coordinated team approach during an Inpatient Acute Rehabilitation admission.  The patient will receive intensive therapy as well as Rehabilitation physician, nursing, social worker, and care management interventions.  Due to bladder management, bowel management, safety, skin/wound care, disease management, medication administration, pain management and patient education the patient requires 24 hour a day rehabilitation nursing.  The patient is currently overall Mod assist with mobility and basic ADLs.  Discharge setting and therapy post discharge at [facility] IP discharge location:[id number]} is anticipated.  Patient has agreed to participate in the Acute Inpatient Rehabilitation Program and will admit {Time; today/tomorrow:10263}.  Preadmission Screen Completed By:  Cleatrice Burke,  03/30/2021 9:35 AM ______________________________________________________________________   Discussed status with Dr. Marland Kitchen on *** at *** and received approval for admission today.  Admission Coordinator:  Cleatrice Burke, RN, time Marland KitchenSudie Grumbling ***   Assessment/Plan: Diagnosis: 1. Does the need for close, 24 hr/day Medical supervision in concert with the patient's rehab needs make it unreasonable for this patient to be served in a less intensive setting? {yes_no_potentially:3041433} 2. Co-Morbidities requiring supervision/potential complications: *** 3. Due to {due NI:6270350}, does the patient require 24 hr/day rehab nursing? {yes_no_potentially:3041433} 4. Does the patient require coordinated care of a physician, rehab nurse, PT, OT, and SLP to address physical and functional deficits in the context of the above medical diagnosis(es)? {yes_no_potentially:3041433} Addressing deficits in the following areas: {deficits:3041436} 5. Can the patient actively participate in an intensive therapy program of at least 3 hrs of therapy 5 days a week? {yes_no_potentially:3041433} 6. The potential for patient to make measurable gains while on inpatient rehab is {potential:3041437} 7. Anticipated functional outcomes upon discharge from inpatient rehab: {functional outcomes:304600100} PT, {functional outcomes:304600100} OT, {functional outcomes:304600100} SLP 8. Estimated rehab  length of stay to reach the above functional goals is: *** 9. Anticipated discharge destination: {anticipated dc setting:21604} 10. Overall Rehab/Functional Prognosis: {potential:3041437}   MD Signature: ***

## 2021-03-30 NOTE — Plan of Care (Signed)
Shift Summary: Pt orientedx3, remains on RA d/t frequently removing humidified TC; VSS. Pain addressed with PRN oxycodone per MAR. I/O cath for 551ml clear urine x1; no urge to void and did not want to attempt to get to [facility] to void despite much encouragement. Ostomy with minimal output, burped several times overnight. NPWT functioning w/o issue. Turns self in bed. Wore orthotic boots for several hours then patient removed and did not want to wear them again. Fall/safety precautions in place, needs/concerns addressed, rounding performed.  Problem: Education: Goal: Knowledge of General Education information will improve Description: Including pain rating scale, medication(s)/side effects and non-pharmacologic comfort measures Outcome: Progressing   Problem: Health Behavior/Discharge Planning: Goal: Ability to manage health-related needs will improve Outcome: Progressing   Problem: Clinical Measurements: Goal: Ability to maintain clinical measurements within normal limits will improve Outcome: Progressing Goal: Will remain free from infection Outcome: Progressing Goal: Diagnostic test results will improve Outcome: Progressing Goal: Respiratory complications will improve Outcome: Progressing Goal: Cardiovascular complication will be avoided Outcome: Progressing   Problem: Activity: Goal: Risk for activity intolerance will decrease Outcome: Progressing   Problem: Nutrition: Goal: Adequate nutrition will be maintained Outcome: Progressing   Problem: Coping: Goal: Level of anxiety will decrease Outcome: Progressing   Problem: Elimination: Goal: Will not experience complications related to bowel motility Outcome: Progressing Goal: Will not experience complications related to urinary retention Outcome: Progressing   Problem: Pain Managment: Goal: General experience of comfort will improve Outcome: Progressing   Problem: Safety: Goal: Ability to remain free from injury will  improve Outcome: Progressing   Problem: Skin Integrity: Goal: Risk for impaired skin integrity will decrease Outcome: Progressing   Problem: Education: Goal: Knowledge about tracheostomy care/management will improve Outcome: Progressing   Problem: Activity: Goal: Ability to tolerate increased activity will improve Outcome: Progressing   Problem: Health Behavior/Discharge Planning: Goal: Ability to manage tracheostomy will improve Outcome: Progressing   Problem: Respiratory: Goal: Patent airway maintenance will improve Outcome: Progressing   Problem: Role Relationship: Goal: Ability to communicate will improve Outcome: Progressing   Problem: Education: Goal: Knowledge of ostomy care will improve Outcome: Progressing Goal: Understanding of discharge needs will improve Outcome: Progressing   Problem: Bowel/Gastric/Urinary: Goal: Gastrointestinal status for postoperative course will improve Outcome: Progressing   Problem: Coping: Goal: Coping ability will improve Outcome: Progressing   Problem: Fluid Volume: Goal: Ability to achieve a balanced intake and output will improve Outcome: Progressing   Problem: Health Behavior/Discharge Planning: Goal: Ability to manage health-related needs will improve Outcome: Progressing   Problem: Nutrition: Goal: Will attain and maintain optimal nutritional status will improve Outcome: Progressing   Problem: Clinical Measurements: Goal: Postoperative complications will be avoided or minimized Outcome: Progressing   Problem: Skin Integrity: Goal: Will show signs of wound healing Outcome: Progressing Goal: Risk for impaired skin integrity will decrease Outcome: Progressing

## 2021-03-30 NOTE — Progress Notes (Signed)
Calorie Count Note Day 3  72 hour calorie count ordered.  Diet: DYS 1, nectar thick Supplements: Magic Cup TID, Nepro TID  Estimated Nutritional Needs: Kcal:1700-1900kcal/day Protein:100-115g/day Fluid:1.4-1.7L/day  No meal tickets available to calculate intake from final day of calorie count. Discussed intake this AM with RN. States that pt ate a few bites of her breakfast, and sipped on her Nepro supplement as well as some orange juice.   Based on intake over the last 3 days, it is unlikely that pt will be able to meet her increased needs orally. As the majority of her nutrition is coming in the form of nutrition supplements and even then, only meeting about 1/3 of her estimated nutrition needs. Would recommend G-tube to provide nutrition and support healing until pt is able to support her needs through oral intake.    Nutrition Dx: Severe Malnutritionrelated to acute illnessas evidenced by moderate fat depletion,moderate muscle depletion,severe muscle depletion.   Ranell Patrick, RD, LDN Clinical Dietitian Pager on [place]

## 2021-03-30 NOTE — Progress Notes (Signed)
PT Cancellation Note  Patient Details Name: Desiree Madden MRN: [id number] DOB: 30-May-1951   Cancelled Treatment:      PT attempt. Pt was very lethargic with daughter at bedside. Pt politely refused 2/2 to fatigue. Acute PT will continue to follow and progress as able per POC.    Willette Pa 03/30/2021, 2:32 PM

## 2021-03-30 NOTE — Telephone Encounter (Signed)
Per Sam please schedule her for outpatient 2-part voiding trial in about 2 weeks  App made [facility]

## 2021-03-30 NOTE — Progress Notes (Signed)
[facility] for Electrolyte Monitoring and Replacement   Recent Labs: Potassium (mmol/L)  Date Value  03/30/2021 3.8   Magnesium (mg/dL)  Date Value  03/30/2021 1.8   Calcium (mg/dL)  Date Value  03/30/2021 9.0   Albumin (g/dL)  Date Value  03/16/2021 2.4 (L)   Phosphorus (mg/dL)  Date Value  03/29/2021 4.6   Sodium (mmol/L)  Date Value  03/30/2021 137   Assessment: Desiree Madden is a 70 y.o. female admitted on 02/21/2021 with intra-abdominal infection (diverticulitis w/ fecal perforation). Patient septic with acute diverticulitis abdominal abscess with colovesical fistula and enterococcus urinary tract infection which was further complicated by the development of complicated diverticulitis with perforation and gangrenous changes requiring Hartman's procedure performed on 03/01/21. PMH includes CAD s/p CABG (2015), HTN, hypothyroidism, colon bladder fistula, diverticulitis, and ulcerative colitis. Pharmacy was consulted for electrolyte monitoring.  Nutrition: Tube feeds stopped. SLP has cleared patient for dysphagia 1 diet  Goal of Therapy:  K 4.0 - 5.1 mmol/L Mg 2.0 - 2.4 mg/dL All Other Electrolytes WNL  Plan:  K 3.8  Mag 1.8  Scr 0.97  Magnesium sulfate 2 gm IV x1  KCL PO 20 meq 1 packet x 1 dose  Re-check electrolytes with AM labs  Chinita Greenland PharmD Clinical Pharmacist 03/30/2021

## 2021-03-30 NOTE — Consult Note (Addendum)
[place] Nurse wound follow up Vac dressing change performed. Wound type:Abd with full thickness post-op wound to midline;has evolved into 2 separate wounds to upper and lower abd with narrow bridge of skin in between. Beefyred, small amtpinkdrainage,no odor Ptwas medicated for pain prior to the procedure and tolerated with minimal amt discomfort.Applied barrier ring to creases surrounding wound at 3:00 o'clockand 9:00 o'clockandalso5:00 o'clock to 7:00 o'clock, then one piece black foamto 156mm cont suction. Wound is located in close proximity to the ostomy andmaybe difficult to maintain a seal. WOC team will change dressing Q M/W/F.  WOC Nurse ostomy follow up Stoma type/location:LMQ colostomy Stomal assessment/size:1 and1/4inches round, red, raisedabove skin level. There is a crease at 9:00 o'clock to the stoma and another deep crease below stoma; applied barrier ring to attempt to maintain a seal. Output:mod amt liquid brown stool Ostomy pouching:Applied2pc.2 and 3/4 inch pouching system with skin barrier ring(Barrier Kellie Simmering # [phone], wafer #2, pouch # [id number]) supplies at the bedside for staff nurse use. Education provided:No family present; progress notes indicate pt will discharge toSNFand have total assistance with pouching activities.Pt stated she was tired and did not watch the process or participate.  Enrolled patient in [place] Discharge program:Yes, previously Julien Girt MSN, RN, [employer], [employer], CNS

## 2021-03-30 NOTE — Progress Notes (Signed)
PROGRESS NOTE    Desiree Madden   IWP:[id number]  DOB: November 25, 1951  PCP: [facility]    DOA: 02/21/2021 LOS: 47   Brief Narrative   Desiree Madden is a 70 y.o. female with medical history significant for colonic bladder fistula, diverticulitis, hypertension, hypothyroidism, hyperlipidemia, ulcerative colitis, who presented to the hospital on 02/21/2021 because of abdominal pain.  She was admitted with acute diverticulitis.    Hospital course was complicated by perforated diverticulitis and she underwent emergent sigmoid colectomy with end colostomy and Hartman's procedure on 03/01/2021.  She was transferred to the ICU postoperatively, developed septic shock requiring multiple vasopressors.  She remained intubated on mechanical ventilation postoperatively and subsequent difficulty liberating pt from the ventilator, failed extubation on 03/06/2021 and was reintubated.   ENT was consulted and tracheostomy was performed on 03/08/2021.    Assessment & Plan   Principal Problem:   Diverticulitis Active Problems:   Fistula, bladder   Ulcerative colitis ([place])   Hyperlipidemia   Essential hypertension   Hypothyroidism   Abnormal ECG   CKD (chronic kidney disease), stage III (HCC)   Elevated lactic acid level   Protein-calorie malnutrition, severe   Septic shock, hemorrhagic shock secondary to complicated diverticulitis with perforation, pelvic abscess, colo-vesicular fistula, gangrene of the sigmoid colon: Shock physiology resolved, ICU course as outlined above.   S/p sigmoid colectomy with end colostomy, Hartman's procedure on 03/01/2021. Completed antibiotics on 03/15/2021.   --Wound VAC in place --Continue local wound care --Follow-up with general surgery for further recommendations.  Dysphagia - due to trach in place Severe malnutrition - tolerating dysphagia 1 diet and nectar thick liquids --encourage PO intake  --appreciate dietician and SLP recommendations  Acute on  chronic hypoxic respiratory failure with tracheostomy placed - Failure to wean from ventilator requiring tracheostomy.  Patient has been tolerating room air yesterday and today (4/19-20).   --please continue humidification overnight per pt and daughter request --my colleague, Dr. Mal Misty, discussed with ENT Dr. Richardson Landry re trach removal.  Want to ensure stable respiratory status and not requiring any support prior to removal.  Possible removal early next week.  Hypokalemia / Hypomagnesemia - Replaced K and Mg.  --Monitor and further replacement as needed  Non-anion gap metabolic acidosis - Improved. Mildly elevated creatinine: Encourage adequate oral hydration.  SVT, nonsustained, Sinus tachycardia, paroxysmal atrial fibrillation -  --Continue amiodarone and Eliquis.  NSTEMI, ischemic cardiomyopathy, chronic systolic and diastolic CHF - appears well compensated.  2D echo showed EF estimated at 20 to 25% and grade 1 diastolic dysfunction. --Continue Toprol-XL  Acute urinary retention - Urology repaired colo-vesicular fistula.  Cystogram was done 4/19 and there was no evidence of leak.  Foley removed. 4/21: Patient was able to spontaneously void today 4/22: Patient continues to retain, Foley replaced --appreciate urology recommendations --Completed two days antibiotics with cysto for prophylaxis, per urology  Tracheostomy in place - follow ENT Dr. Richardson Landry.  Possible removal early next week if respiratory status remains stable.  -- Confirm follow-up while at CIR   Moderately severe depression -multifactorial.  Chart reviewed, PCP note of 01/16/2021: PHQ-9 score was 15 consistent with moderately severe depression.  This now likely complicated by all of above, having trach, prolonged hospital stay.  Patient and daughter agreeable to try on an antidepressant. -- Started on low-dose of Remeron qHS   (May also help with appetite and sleep) -- Close outpatient follow-up  Generalized weakness  -related to her lengthy hospital admission.  Insurance authorization is pending for acute  inpatient rehab. -- Continue PT OT while here  Patient BMI: Body mass index is 22.13 kg/m.   DVT prophylaxis: SCDs Start: 02/21/21 2200 apixaban (ELIQUIS) tablet 5 mg   Diet:  Diet Orders (From admission, onward)    Start     Ordered   03/28/21 1433  DIET - DYS 1 Room service appropriate? Yes with Assist; Fluid consistency: Nectar Thick  Diet effective now       Comments: Just a Puree Diet. May have Oatmeal per Speech w/ butter, sugar. Yogurt, pudding, magic cup on trays. NO STRAWS.  Question Answer Comment  Room service appropriate? Yes with Assist   Fluid consistency: Nectar Thick      03/28/21 1433            Code Status: DNR    Subjective 03/30/21    Patient awake sitting up in bed when seen this morning.  She reports having a little bit of a panic attack earlier.  States the medicine they gave her helped.  She denies any trigger to the attacks as it just comes on sometimes.  Otherwise denies acute complaints including chest pain or shortness of breath, nausea vomiting, cough or congestion, fevers or chills.   Disposition Plan & Communication   Status is: Inpatient  Remains inpatient appropriate because: Patient requires acute rehab, awaiting evaluation and insurance authorization.   Dispo: The patient is from: Home              Anticipated d/c is to: CIR              Patient currently is medically stable for discharge   Difficult to place patient No   Family Communication: Spoke to [name] by phone this afternoon. Discussed nutrition need and potential need for feeding tube.   Consults, Procedures, Significant Events   Consultants:   General surgery  Urology  ENT  PCCM  Procedures:   Tracheostomy 03/08/2021  Sigmoid colectomy with end colostomy, Hartman's procedure 03/01/2021  Cystorrhaphy by urology on 3/24 for repair of colovesicular  fistula  Antimicrobials:  Anti-infectives (From admission, onward)   Start     Dose/Rate Route Frequency Ordered Stop   03/27/21 2200  amoxicillin (AMOXIL) capsule 500 mg        500 mg Oral Every 8 hours 03/27/21 1401 03/29/21 1727   03/13/21 1400  meropenem (MERREM) 1 g in sodium chloride 0.9 % 100 mL IVPB        1 g 200 mL/hr over 30 Minutes Intravenous Every 8 hours 03/13/21 0855 03/16/21 2203   03/12/21 1800  meropenem (MERREM) 1 g in sodium chloride 0.9 % 100 mL IVPB  Status:  Discontinued        1 g 200 mL/hr over 30 Minutes Intravenous Every 12 hours 03/12/21 0840 03/13/21 0855   03/10/21 1000  meropenem (MERREM) 1 g in sodium chloride 0.9 % 100 mL IVPB  Status:  Discontinued        1 g 200 mL/hr over 30 Minutes Intravenous Every 8 hours 03/10/21 0854 03/12/21 0840   03/09/21 0600  fluconazole (DIFLUCAN) IVPB 200 mg  Status:  Discontinued        200 mg 100 mL/hr over 60 Minutes Intravenous Every 24 hours 03/08/21 0751 03/14/21 1014   03/08/21 2200  meropenem (MERREM) 1 g in sodium chloride 0.9 % 100 mL IVPB  Status:  Discontinued        1 g 200 mL/hr over 30 Minutes Intravenous Every 12 hours 03/08/21  2330 03/10/21 0854   03/08/21 0600  fluconazole (DIFLUCAN) IVPB 400 mg  Status:  Discontinued        400 mg 100 mL/hr over 120 Minutes Intravenous Every 24 hours 03/07/21 1321 03/08/21 0839   03/07/21 1100  meropenem (MERREM) 1 g in sodium chloride 0.9 % 100 mL IVPB  Status:  Discontinued        1 g 200 mL/hr over 30 Minutes Intravenous Every 8 hours 03/07/21 1013 03/08/21 0748   03/07/21 1000  meropenem (MERREM) 1 g in sodium chloride 0.9 % 100 mL IVPB  Status:  Discontinued        1 g 200 mL/hr over 30 Minutes Intravenous Every 12 hours 03/06/21 2243 03/07/21 1013   03/07/21 0600  fluconazole (DIFLUCAN) IVPB 400 mg        400 mg 100 mL/hr over 120 Minutes Intravenous  Once 03/06/21 2243 03/07/21 0701   03/04/21 1800  vancomycin (VANCOREADY) IVPB 750 mg/150 mL        750  mg 150 mL/hr over 60 Minutes Intravenous Every 24 hours 03/03/21 1442 03/06/21 2040   03/03/21 0800  vancomycin (VANCOREADY) IVPB 1500 mg/300 mL  Status:  Discontinued        1,500 mg 150 mL/hr over 120 Minutes Intravenous Every 48 hours 03/02/21 1410 03/03/21 1442   03/02/21 0800  vancomycin (VANCOREADY) IVPB 750 mg/150 mL  Status:  Discontinued        750 mg 150 mL/hr over 60 Minutes Intravenous Every 24 hours 03/01/21 1954 03/02/21 1409   03/01/21 2200  ceFEPIme (MAXIPIME) 2 g in sodium chloride 0.9 % 100 mL IVPB        2 g 200 mL/hr over 30 Minutes Intravenous Every 12 hours 03/01/21 1903 03/06/21 2203   03/01/21 2100  vancomycin (VANCOREADY) IVPB 1250 mg/250 mL        1,250 mg 166.7 mL/hr over 90 Minutes Intravenous  Once 03/01/21 1954 03/02/21 0008   03/01/21 2000  metroNIDAZOLE (FLAGYL) IVPB 500 mg        500 mg 100 mL/hr over 60 Minutes Intravenous Every 8 hours 03/01/21 1903 03/06/21 2002   02/28/21 1800  piperacillin-tazobactam (ZOSYN) IVPB 3.375 g  Status:  Discontinued        3.375 g 12.5 mL/hr over 240 Minutes Intravenous Every 8 hours 02/28/21 1643 03/01/21 1903   02/28/21 1730  piperacillin-tazobactam (ZOSYN) IVPB 3.375 g  Status:  Discontinued        3.375 g 100 mL/hr over 30 Minutes Intravenous Every 8 hours 02/28/21 1635 02/28/21 1642   02/28/21 1300  Ampicillin-Sulbactam (UNASYN) 3 g in sodium chloride 0.9 % 100 mL IVPB  Status:  Discontinued        3 g 200 mL/hr over 30 Minutes Intravenous Every 6 hours 02/28/21 1156 02/28/21 1635   02/23/21 1230  piperacillin-tazobactam (ZOSYN) IVPB 3.375 g  Status:  Discontinued        3.375 g 12.5 mL/hr over 240 Minutes Intravenous Every 8 hours 02/23/21 1143 02/28/21 1155   02/22/21 0600  ciprofloxacin (CIPRO) IVPB 400 mg  Status:  Discontinued        400 mg 200 mL/hr over 60 Minutes Intravenous Every 12 hours 02/21/21 2028 02/23/21 1143   02/22/21 0400  metroNIDAZOLE (FLAGYL) IVPB 500 mg  Status:  Discontinued        500  mg 100 mL/hr over 60 Minutes Intravenous Every 8 hours 02/21/21 2028 02/23/21 1143   02/21/21 1830  ciprofloxacin (CIPRO) IVPB 400  mg        400 mg 200 mL/hr over 60 Minutes Intravenous  Once 02/21/21 1820 02/21/21 1939   02/21/21 1830  metroNIDAZOLE (FLAGYL) IVPB 500 mg        500 mg 100 mL/hr over 60 Minutes Intravenous  Once 02/21/21 1820 02/21/21 2110        Micro    Objective   Vitals:   03/30/21 0456 03/30/21 0747 03/30/21 1218 03/30/21 1617  BP: 128/67 118/67 122/68 103/60  Pulse: 74 74 75 77  Resp: _0 Temp: 98.1 F (36.7 C) 98.6 F (37 C) 98.5 F (36.9 C) 98.7 F (37.1 C)  TempSrc: Oral Oral Oral Oral  SpO2: 99% 99% 100% 100%  Weight: 58.5 kg     Height:        Intake/Output Summary (Last 24 hours) at 03/30/2021 1733 Last data filed at 03/30/2021 1347 Gross per 24 hour  Intake 408 ml  Output 600 ml  Net -192 ml   Filed Weights   03/28/21 0457 03/29/21 0457 03/30/21 0456  Weight: 64.3 kg 61.9 kg 58.5 kg    Physical Exam:  General exam: awake, alert, no acute distress, appears anxious, frail HEENT: Trach with PMV, speaking well, hearing grossly normal  Respiratory system: normal respiratory effort, on room air, CTAB. Cardiovascular system: normal S1/S2, RRR, no pedal edema.   Gastrointestinal system: midline incision site with clean dry intact dressing, left side colostomy bag in place, abdomen soft  Central nervous system: A&O x3. no gross focal neurologic deficits, normal speech Psychiatry: Depressed mood, congruent affect, judgement and insight appear normal  Labs   Data Reviewed: I have personally reviewed following labs and imaging studies  CBC: Recent Labs  Lab 03/26/21 0321 03/29/21 0525  WBC 6.2 6.9  HGB 9.6* 9.0*  HCT 29.6* 27.3*  MCV 93.7 94.1  PLT 381 382   Basic Metabolic Panel: Recent Labs  Lab 03/24/21 0436 03/26/21 0321 03/26/21 0500 03/27/21 0522 03/28/21 0537 03/29/21 0525 03/30/21 0503  NA 142 140  --  138  138 137 137  K 3.6 2.5*  --  4.2 3.8 3.5 3.8  CL 110 116*  --  105 105 103 103  CO2 24 18*  --  _1 GLUCOSE 87 82  --  104* 109* 95 89  BUN 46* 26*  --  37* 35* 37* 32*  CREATININE 0.97 0.68  --  1.05* 0.99 1.00 0.97  CALCIUM 8.8* 6.5*  --  8.9 9.1 9.0 9.0  MG 2.1  --  1.4* 2.2 2.0 2.0 1.8  PHOS 5.0* 3.2  --  4.2  --  4.6  --    GFR: Estimated Creatinine Clearance: 46.6 mL/min (by C-G formula based on SCr of 0.97 mg/dL). Liver Function Tests: No results for input(s): AST, ALT, ALKPHOS, BILITOT, PROT, ALBUMIN in the last 168 hours. No results for input(s): LIPASE, AMYLASE in the last 168 hours. No results for input(s): AMMONIA in the last 168 hours. Coagulation Profile: No results for input(s): INR, PROTIME in the last 168 hours. Cardiac Enzymes: No results for input(s): CKTOTAL, CKMB, CKMBINDEX, TROPONINI in the last 168 hours. BNP (last 3 results) No results for input(s): PROBNP in the last 8760 hours. HbA1C: No results for input(s): HGBA1C in the last 72 hours. CBG: Recent Labs  Lab 03/29/21 1529 03/29/21 2123 03/30/21 0746 03/30/21 1220 03/30/21 1511  GLUCAP 76 147* 81 114* 129*   Lipid Profile: No results for input(s):  CHOL, HDL, LDLCALC, TRIG, CHOLHDL, LDLDIRECT in the last 72 hours. Thyroid Function Tests: No results for input(s): TSH, T4TOTAL, FREET4, T3FREE, THYROIDAB in the last 72 hours. Anemia Panel: No results for input(s): VITAMINB12, FOLATE, FERRITIN, TIBC, IRON, RETICCTPCT in the last 72 hours. Sepsis Labs: No results for input(s): PROCALCITON, LATICACIDVEN in the last 168 hours.  Recent Results (from the past 240 hour(s))  Resp Panel by RT-PCR (Flu A&B, Covid) Nasopharyngeal Swab     Status: None   Collection Time: 03/26/21 10:29 AM   Specimen: Nasopharyngeal Swab; Nasopharyngeal(NP) swabs in vial transport medium  Result Value Ref Range Status   SARS Coronavirus 2 by RT PCR NEGATIVE NEGATIVE Final    Comment: (NOTE) SARS-CoV-2 target  nucleic acids are NOT DETECTED.  The SARS-CoV-2 RNA is generally detectable in upper respiratory specimens during the acute phase of infection. The lowest concentration of SARS-CoV-2 viral copies this assay can detect is 138 copies/mL. A negative result does not preclude SARS-Cov-2 infection and should not be used as the sole basis for treatment or other patient management decisions. A negative result may occur with  improper specimen collection/handling, submission of specimen other than nasopharyngeal swab, presence of viral mutation(s) within the areas targeted by this assay, and inadequate number of viral copies(<138 copies/mL). A negative result must be combined with clinical observations, patient history, and epidemiological information. The expected result is Negative.  Fact Sheet for Patients:  EntrepreneurPulse.com.au  Fact Sheet for Healthcare Providers:  IncredibleEmployment.be  This test is no t yet approved or cleared by the [place] FDA and  has been authorized for detection and/or diagnosis of SARS-CoV-2 by FDA under an Emergency Use Authorization (EUA). This EUA will remain  in effect (meaning this test can be used) for the duration of the COVID-19 declaration under Section 564(b)(1) of the Act, 21 U.S.C.section 360bbb-3(b)(1), unless the authorization is terminated  or revoked sooner.       Influenza A by PCR NEGATIVE NEGATIVE Final   Influenza B by PCR NEGATIVE NEGATIVE Final    Comment: (NOTE) The Xpert Xpress SARS-CoV-2/FLU/RSV plus assay is intended as an aid in the diagnosis of influenza from Nasopharyngeal swab specimens and should not be used as a sole basis for treatment. Nasal washings and aspirates are unacceptable for Xpert Xpress SARS-CoV-2/FLU/RSV testing.  Fact Sheet for Patients: EntrepreneurPulse.com.au  Fact Sheet for Healthcare  Providers: IncredibleEmployment.be  This test is not yet approved or cleared by the [place] FDA and has been authorized for detection and/or diagnosis of SARS-CoV-2 by FDA under an Emergency Use Authorization (EUA). This EUA will remain in effect (meaning this test can be used) for the duration of the COVID-19 declaration under Section 564(b)(1) of the Act, 21 U.S.C. section 360bbb-3(b)(1), unless the authorization is terminated or revoked.  Performed at [facility], [address] [place], [place] [address]       Imaging Studies   No results found.   Medications   Scheduled Meds: . amiodarone  200 mg Oral BID  . apixaban  5 mg Oral BID  . vitamin C  500 mg Oral BID  . chlorhexidine gluconate (MEDLINE KIT)  15 mL Mouth Rinse BID  . Chlorhexidine Gluconate Cloth  6 each Topical Daily  . docusate  100 mg Oral BID  . feeding supplement (NEPRO CARB STEADY)  237 mL Oral TID BM  . insulin aspart  0-5 Units Subcutaneous QHS  . insulin aspart  0-9 Units Subcutaneous TID WC  . levothyroxine  75 mcg Oral Q0600  . mouth rinse  15 mL Mouth Rinse BID  . metoprolol succinate  12.5 mg Oral Daily  . mirtazapine  15 mg Oral QHS  . multivitamin with minerals  1 tablet Oral Daily  . pantoprazole  40 mg Oral Daily  . polyethylene glycol  17 g Oral Daily  . sodium chloride flush  10-40 mL Intracatheter Q12H   Continuous Infusions: . sodium chloride Stopped (03/20/21 1026)       LOS: 37 days    Time spent: 30 minutes     Ezekiel Slocumb, DO [facility]  03/30/2021, 5:33 PM      If 7PM-7AM, please contact night-coverage. How to contact the [facility] Attending or Consulting provider [place] or covering provider during after hours [place], for this patient?    1. Check the care team in [facility] and look for a) attending/consulting TRH provider listed and b) the [facility] team listed 2. Log into [email or web address] and use [place]'s universal password  to access. If you do not have the password, please contact the hospital operator. 3. Locate the [facility] provider you are looking for under [facility] and page to a number that you can be directly reached. 4. If you still have difficulty reaching the provider, please page the [facility] (Director on Call) for the Hospitalists listed on amion for assistance.

## 2021-03-30 NOTE — Progress Notes (Signed)
Inpatient Rehabilitation Admissions Coordinator  I have notified Dr. Arbutus Ped by secure chat and [facility] text page that Josephine Igo MD is requesting peer to peer discussion with treating MD. Deadline is 12 noon on Monday 12/25. I contacted her daughter by phone and she is aware. I will follow up on Monday.  Danne Baxter, RN, MSN Rehab Admissions Coordinator [phone] 03/30/2021 3:21 PM

## 2021-03-30 NOTE — PMR Pre-admission (Signed)
PMR Admission Coordinator Pre-Admission Assessment  Patient: Desiree Madden is an 70 y.o., female MRN: [id number] DOB: 11-May-1951 Height: 5' 4.02" (162.6 cm) Weight: 64 kg  Insurance Information HMO: yes    PPO:      PCP:      IPA:      80/20:      OTHER:  PRIMARY: [employer] Medicare      Policy#: [id number]      Subscriber: pt CM Name: Shirlean Mylar      Phone#: [phone] ext [phone]     Fax#: [phone] Pre-Cert#: [id number] approved for 7 days with f/u Edwena Felty ext [phone] same fax     Employer:  Benefits:  Phone #: [phone]     Name: 4/22 Eff. Date: 12/10/2019     Deduct: none      Out of Pocket Max: $3900      Life Max: none CIR: $295 co pay per day days 1 until 6      SNF: no copay days 1 until 20; $188 co pay per day days 21 until 41; no copay days 22 until 100 Outpatient: $10 to $20 per visit     Co-Pay: visits per medical neccesity Home Health: 100%      Co-Pay: visits per medical neccesity DME: 80%     Co-Pay: 20% Providers: in network  SECONDARY: none      Policy#:      Phone#:   Development worker, community:       Phone#:   The Engineer, petroleum" for patients in Inpatient Rehabilitation Facilities with attached "Privacy Act [place] Records" was provided and verbally reviewed with: Patient and Family  Emergency Contact Information Contact Information    Name Relation Home Work Mobile   brunquell,jessica Daughter   [phone]   Esaw Grandchild   [phone]      Current Medical History  Patient Admitting Diagnosis: Debility  History of Present Illness: 70 year old right-handed female with history of hypothyroidism, CAD with CABG 2015 hypertension, hyperlipidemia as well as tobacco use, recurrent UTIs, colovesicular fistula  and diverticulitis followed by gastroenterology services Dr Earlie Counts.  Presented 02/21/2021 to [facility] with lower abdominal pain of acute onset as well as bouts of vomiting.  CT on admission suspicious for acute diverticulitis  based on pericolonic fat stranding seen about the proximal sigmoid colon in the region of multiple colonic diverticula.  Large volume stool burden also seen.  Patient was seen by her primary gastroenterologist Dr.Feiler most recently November 2021 and notes the patient had series of UTIs ultimately found to have cola vascular fistula as a consequence of previous episodes of diverticulitis.  She had been seen by colorectal surgery recommended laparoscopic possible open sigmoid resection with left ureteric stent that was to be completed in October or November but incomplete procedure due to poor prep both times.  Patient underwent placement of image guided drain left lower quadrant/pigtail 02/28/2021 per interventional radiology.  Follow-up x-rays and imaging revealed massive edematous and chronically scarred sigmoid colon extending from mid descending colon with redundancy into the right pelvis and gangrenous changes.  Underwent sigmoid colectomy with end colostomy, Hartman's procedure, splenic flexure mobilization 03/01/2021 per Dr. Sheilah Mins surgery as well as cystorrhaphy same day per Dr. Diamantina Providence of urology services.  Patient did remain intubated noting septic shock requiring multiple vasopressors followed by critical care.  Maintained on broad-spectrum antibiotics.  Due to prolonged intubation underwent tracheostomy 03/08/2021 per Dr. Clyde Canterbury with a #6 Shiley tube placed.  On 03/09/2021 due to redundant viable  access: With resultant colostomy dysfunction underwent colostomy revision 03/09/2021.  A wound VAC remained in place.  Follow-up critical care medicine regards to acute on chronic respiratory failure tolerating room air since 03/27/2021 awaiting plan for decannulation.  Cardiology follow-up for history of CAD CABG developing SVT nonsustained requiring amiodarone as well as Eliquis.  In regards to patient's acute urinary retention status postrepair colovesicular fistula cystogram was completed no  evidence of leak.  Foley tube removed but replaced 03/30/2021 due to ongoing retention requiring in and out catheterizations.  Her diet has been advanced to dysphagia #1 nectar thick liquid.  Hospital course anemia 9.0 and monitored.  Patient with routine skin checks foam dressing to buttocks change every 3 days as needed soiling.  Patient's medical record from [facility] has been reviewed by the rehabilitation admission coordinator and physician.  Past Medical History  Past Medical History:  Diagnosis Date  . Hypertension   . Thyroid disease     Family History   family history includes Arthritis in an other family member; CAD in her mother.  Prior Rehab/Hospitalizations Has the patient had prior rehab or hospitalizations prior to admission? Yes  Has the patient had major surgery during 100 days prior to admission? Yes   Current Medications  Current Facility-Administered Medications:  .  0.9 %  sodium chloride infusion, 250 mL, Intravenous, Continuous, Jennye Boroughs, MD, Stopped at 03/20/21 1026 .  ALPRAZolam Duanne Moron) tablet 0.25 mg, 0.25 mg, Oral, BID PRN, Jennye Boroughs, MD, 0.25 mg at 04/02/21 5916 .  amiodarone (PACERONE) tablet 200 mg, 200 mg, Oral, BID, Jennye Boroughs, MD, 200 mg at 04/02/21 2034 .  apixaban (ELIQUIS) tablet 5 mg, 5 mg, Oral, BID, Jennye Boroughs, MD, 5 mg at 04/02/21 2034 .  ascorbic acid (VITAMIN C) tablet 500 mg, 500 mg, Oral, BID, Jennye Boroughs, MD, 500 mg at 04/02/21 2034 .  chlorhexidine gluconate (MEDLINE KIT) (PERIDEX) 0.12 % solution 15 mL, 15 mL, Mouth Rinse, BID, Jennye Boroughs, MD, 15 mL at 04/02/21 2035 .  Chlorhexidine Gluconate Cloth 2 % PADS 6 each, 6 each, Topical, Daily, Jennye Boroughs, MD, 6 each at 04/03/21 0550 .  docusate (COLACE) 50 MG/5ML liquid 100 mg, 100 mg, Oral, BID, Jennye Boroughs, MD, 100 mg at 03/31/21 2208 .  dronabinol (MARINOL) capsule 2.5 mg, 2.5 mg, Oral, BID AC, Nicole Kindred A, DO, 2.5 mg at 04/02/21 1647 .  feeding supplement  (NEPRO CARB STEADY) liquid 237 mL, 237 mL, Oral, TID BM, Jennye Boroughs, MD, Last Rate: 0 mL/hr at 04/01/21 0426, 237 mL at 04/02/21 2033 .  HYDROmorphone (DILAUDID) injection 0.5 mg, 0.5 mg, Intravenous, Q3H PRN, Jennye Boroughs, MD, 0.5 mg at 04/02/21 2033 .  insulin aspart (novoLOG) injection 0-9 Units, 0-9 Units, Subcutaneous, Daily, Ezekiel Slocumb, DO, 1 Units at 03/31/21 1627 .  ipratropium-albuterol (DUONEB) 0.5-2.5 (3) MG/3ML nebulizer solution 3 mL, 3 mL, Nebulization, Q4H PRN, Jennye Boroughs, MD .  levothyroxine (SYNTHROID) tablet 75 mcg, 75 mcg, Oral, Q0600, Jennye Boroughs, MD, 75 mcg at 04/03/21 0541 .  lip balm (BLISTEX) ointment, , Topical, PRN, Jennye Boroughs, MD, Given at 03/18/21 [phone] .  MEDLINE mouth rinse, 15 mL, Mouth Rinse, BID, Nicole Kindred A, DO, 15 mL at 04/02/21 2035 .  metoprolol succinate (TOPROL-XL) 24 hr tablet 12.5 mg, 12.5 mg, Oral, Daily, Jennye Boroughs, MD, 12.5 mg at 04/02/21 6599 .  mirtazapine (REMERON) tablet 15 mg, 15 mg, Oral, QHS, Nicole Kindred A, DO, 15 mg at 04/02/21 2033 .  multivitamin with minerals tablet  1 tablet, 1 tablet, Oral, Daily, Jennye Boroughs, MD, 1 tablet at 04/02/21 [phone] .  ondansetron (ZOFRAN) tablet 4 mg, 4 mg, Oral, Q6H PRN **OR** ondansetron (ZOFRAN) injection 4 mg, 4 mg, Intravenous, Q6H PRN, Jennye Boroughs, MD .  oxyCODONE (Oxy IR/ROXICODONE) immediate release tablet 5 mg, 5 mg, Oral, Q6H PRN, Jennye Boroughs, MD, 5 mg at 04/02/21 1243 .  pantoprazole (PROTONIX) EC tablet 40 mg, 40 mg, Oral, Daily, Jennye Boroughs, MD, 40 mg at 04/02/21 6712 .  polyethylene glycol (MIRALAX / GLYCOLAX) packet 17 g, 17 g, Oral, Daily, Jennye Boroughs, MD, 17 g at 04/02/21 0811 .  sodium chloride flush (NS) 0.9 % injection 10-40 mL, 10-40 mL, Intracatheter, Q12H, Jennye Boroughs, MD, 10 mL at 04/02/21 2036 .  sodium chloride flush (NS) 0.9 % injection 10-40 mL, 10-40 mL, Intracatheter, PRN, Jennye Boroughs, MD  Patients Current Diet:  Diet Order             DIET - DYS 1 Room service appropriate? Yes with Assist; Fluid consistency: Nectar Thick  Diet effective now                 Precautions / Restrictions Precautions Precautions: Fall Precaution Comments: wound vac/trach/PMV Restrictions Weight Bearing Restrictions: No   Has the patient had 2 or more falls or a fall with injury in the past year? No  Prior Activity Level Community (5-7x/wk): independent, driving and working in Leisure centre manager  Prior Functional Level Self Care: Did the patient need help bathing, dressing, using the toilet or eating? Independent  Indoor Mobility: Did the patient need assistance with walking from room to room (with or without device)? Independent  Stairs: Did the patient need assistance with internal or external stairs (with or without device)? Independent  Functional Cognition: Did the patient need help planning regular tasks such as shopping or remembering to take medications? Independent  Home Assistive Devices / Equipment Home Assistive Devices/Equipment: None Home Equipment: None  Prior Device Use: Indicate devices/aids used by the patient prior to current illness, exacerbation or injury? None of the above  Current Functional Level Cognition  Overall Cognitive Status: No family/caregiver present to determine baseline cognitive functioning Difficult to assess due to: Tracheostomy Orientation Level: Oriented X4 Following Commands: Follows one step commands with increased time,Follows one step commands consistently,Follows multi-step commands inconsistently Safety/Judgement: Decreased awareness of safety,Decreased awareness of deficits General Comments: Pt alert and oriented to self and situation. Demonstrates some anxiety with performing sit>stand transfers, requesting assistance from OT before attempting, however overall motivated to participate in standing ADLs. Able to follow 1-step commands consistently. Requires MIN verbal cues to follow  2-step commands    Extremity Assessment (includes Sensation/Coordination)  Upper Extremity Assessment: Generalized weakness  Lower Extremity Assessment: Generalized weakness,LLE deficits/detail LLE Deficits / Details: noted limited dorsiflexion, CNA notified and encouraged to stretch and pass on in report.    ADLs  Overall ADL's : Needs assistance/impaired Grooming: Oral care,Brushing hair,Set up,Min guard,Standing Grooming Details (indicate cue type and reason): MIN GUARD for standing balance. Upper Body Bathing: Moderate assistance,Standing Upper Body Bathing Details (indicate cue type and reason): MOD A for washing abdomen/trunk. Pt able to wash UE with MIN GUARD for standing balance Lower Body Bathing: Minimal assistance,Sit to/from stand Lower Body Bathing Details (indicate cue type and reason): MIN A to wash back of thighs when standing Upper Body Dressing : Set up,Min guard,Sitting Upper Body Dressing Details (indicate cue type and reason): MIN GUARD to don/doff hospital gown while sitting EOB Lower  Body Dressing: Sitting/lateral leans,Moderate assistance Lower Body Dressing Details (indicate cue type and reason): MIN GUARD to don/doff socks while seated EOB. MAX A to don/doff L LE AFO Functional mobility during ADLs: Minimal assistance,Rolling walker (MIN A to walk ~10 ft with RW, x2 times) General ADL Comments: min guard for sitting balance for pt to wash hair while seated on EOB. Pt performs oral care with set up A and min cuing for thoroughness.    Mobility  Overal bed mobility: Needs Assistance Bed Mobility: Supine to Sit Rolling: Min assist,Mod assist Sidelying to sit: Mod assist Supine to sit: Min assist Sit to supine: Mod assist Sit to sidelying: Max assist General bed mobility comments: Not assessed, in recliner upon arrival    Transfers  Overall transfer level: Needs assistance Equipment used: Rolling walker (2 wheeled) Transfers: Sit to/from Stand Sit to Stand:  Mod assist Stand pivot transfers: Max assist General transfer comment: x4 bouts performed. Required MOD A for upward momentum and verbal cues for safe hand placement during each bout    Ambulation / Gait / Stairs / Wheelchair Mobility  Ambulation/Gait Ambulation/Gait assistance: Min assist,Mod assist Gait Distance (Feet): 14 Feet (1+ 70f, + 8731f Assistive device: Rolling walker (2 wheeled) Gait Pattern/deviations: Step-to pattern General Gait Details: ambulated 31f37fo chair, additional 5ft86fd then additional 8ft 70ft velocity: decreased Stairs:  (deferred secondary to abdominal discomfort)    Posture / Balance Dynamic Sitting Balance Sitting balance - Comments: Pt able to sit EOB during UB/LB dressing with MIN GUARD Balance Overall balance assessment: Needs assistance Sitting-balance support: Feet supported,No upper extremity supported Sitting balance-Leahy Scale: Fair Sitting balance - Comments: Pt able to sit EOB during UB/LB dressing with MIN GUARD Standing balance support: Bilateral upper extremity supported,During functional activity Standing balance-Leahy Scale: Poor Standing balance comment: Requires BUE support from RW/sink to maintain balance during standing ADLs    Special needs/care consideration New trach this admit # 6 cuffed and using PMSV New colostomy this admit Wound VAC to surgical site McNichol, LauriRudi Heap Registered Nurse  WOC  Consult Note     Signed  Date of Service:  04/02/2021 12:07 PM           Signed         Show:Clear all [x] Manual[x] Template[] Copied  Added by: [x] McNichol, LauriRudi Heap  [] Hover for details  WOC N[place] ostomy follow up Stoma type/location: LMQ colosotmy Stomal assessment/size: 1 and 3/8 inches round, raised, red, above skin level.  Peristomal assessment: Creasing at 9 o'clock and beneath stoma Treatment options for stomal/peristomal skin: skin barrier ring segments used to fill creased and establish a flat plane  for pouching Output: brown stool balls, none in pouch Ostomy pouching: 2pc. 2 and 3/4 inch pouching system with aforementioned skin barrier rings Education provided: None Enrolled patient in [employer] program: Yes, previously   WOC [place] wound follow up Wound type:Routine NPWT dressing change. Measurement:Two wounds separated by a thin skin bridge. One staple visible. Proximal wound measures 4.5cm x 2cm x 0.3cm. Distal wound measures 7cm x 3.2cm x 0.4cm  Wound bed: Red, moist. Drainage (amount, consistency, odor) Small serosanguinous Periwound: Some maceration in the periwound areas.  These are covered with skin barrier ring segments prior to application of new NPWT device Dressing procedure/placement/frequency: NPWT dressing removed, 1 piece of black foam.  Wound cleansed with NS, and patted dry. Periwound skin is protected with skin barrier ring segments.  Two pieces of black foam used to  obliterate dead space, these touch over skin barrier ring in center.  Sponges are covered with drape.  Drape is attached to 154mHg continuous negative pressure and an immediate seal is achieved. The dressing is labeled and dated.  The ostomy is then pouched.   WUnicoinursing team will follow, and will remain available to this patient, the nursing and medical teams.   Supply in room for Wednesday.   Thanks, LMaudie Flakes MSN, RN, GCash CArther Abbott Pager# [phone]                Note Details  Author MIllene Regulus RN File Time 04/02/2021 12:15 PM  Author Type Registered Nurse Status Signed  Last Editor MIllene Regulus RN Service WIsmay# [id number] Date 02/21/2021      Previous Home Environment  Living Arrangements: Spouse/significant other (daughter lives in in lMuseum/gallery curator  Lives With: Spouse,Daughter Available Help at Discharge: Family,Available 24 hours/day (daughter works remote from home) [place]  One [place] Home Access: Stairs to enter ETechnical brewerof Steps: 5 Bathroom Shower/Tub: TOptometrist Yes How Accessible: Accessible via [place] No  Discharge Living Setting Plans for Discharge Living Setting: Patient's home,Lives with (comment) Type of Home at Discharge: [place] Discharge [place] One level Discharge Home Access: Stairs to enter Entrance Stairs-Rails: RKinneyreach both Entrance Stairs-Number of Steps: 5 Discharge Bathroom Shower/Tub: TGreenwoodunit Discharge Bathroom Toilet: Standard Discharge Bathroom Accessibility: Yes How Accessible: Accessible via walker Does the patient have any problems obtaining your medications?: No  Social/Family/Support Systems Contact Information: daughter, JJanett BillowAnticipated Caregiver: daughter and spouse, AMitzi HansenAnticipated CAmbulance personInformation: see above Caregiver Availability: 24/7 Discharge Plan Discussed with Primary Caregiver: Yes Is Caregiver In Agreement with Plan?: Yes Does Caregiver/Family have Issues with Lodging/Transportation while Pt is in Rehab?: No  Goals Patient/Family Goal for Rehab: supervision to min assist with PT, OT and SLP Expected length of stay: ELOS 2 weeks Pt/Family Agrees to Admission and willing to participate: Yes Program Orientation Provided & Reviewed with Pt/Caregiver Including Roles  & Responsibilities: Yes  Decrease burden of Care through IP rehab admission: n/a  Possible need for SNF placement upon discharge: not anticipated  Patient Condition: [facility] CM, and patient and daughter. I met with patient at the bedside for inpatient rehabilitation assessment.  Patient will benefit from ongoing PT, OT and SLP, can actively participate in 3 hours of therapy a day 5 days of the week, and can make measurable gains during the admission.  Patient will also  benefit from the coordinated team approach during an Inpatient Acute Rehabilitation admission.  The patient will receive intensive therapy as well as Rehabilitation physician, nursing, social worker, and care management interventions.  Due to bladder management, bowel management, safety, skin/wound care, disease management, medication administration, pain management and patient education the patient requires 24 hour a day rehabilitation nursing.  The patient is currently overall Mod assist with mobility and basic ADLs.  Discharge setting and therapy post discharge at home with home health is anticipated.  Patient has agreed to participate in the Acute Inpatient Rehabilitation Program and will admit today.  Preadmission Screen Completed By:  BCleatrice Burke 04/03/2021 10:04 AM ______________________________________________________________________   Discussed status with Dr. LDagoberto Ligason 04/03/2021 at 154and received approval for admission today.  Admission Coordinator:  BCleatrice Burke RN, time  [phone]  Date 04/03/2021   Assessment/Plan: Diagnosis: 1. Does the need for close, 24 hr/day Medical supervision in concert with the patient's rehab needs make it unreasonable for this patient to be served in a less intensive setting? Yes 2. Co-Morbidities requiring supervision/potential complications: colostomy, dysphagia, D1/nectar thick diet; Afib, SVT, CHF, malnutrition; trach, and debility 3. Due to bladder management, bowel management, safety, skin/wound care, disease management, medication administration, pain management and patient education, does the patient require 24 hr/day rehab nursing? Yes 4. Does the patient require coordinated care of a physician, rehab nurse, PT, OT, and SLP to address physical and functional deficits in the context of the above medical diagnosis(es)? Yes Addressing deficits in the following areas: balance, endurance, locomotion, strength, transferring, bowel/bladder  control, bathing, dressing, feeding, grooming, toileting and swallowing 5. Can the patient actively participate in an intensive therapy program of at least 3 hrs of therapy 5 days a week? Yes 6. The potential for patient to make measurable gains while on inpatient rehab is good and fair 7. Anticipated functional outcomes upon discharge from inpatient rehab: supervision and min assist PT, supervision and min assist OT, supervision and min assist SLP 8. Estimated rehab length of stay to reach the above functional goals is: 2 weeks or so 9. Anticipated discharge destination: Home 10. Overall Rehab/Functional Prognosis: good   MD Signature:

## 2021-03-30 NOTE — Progress Notes (Signed)
Pt is non-compliant with trach collar. Respiratory put trach collar on pt tonight after changing the inner canula of the trach. Pt immediately took it off after RT left the room. This RN put collar back on and explained to pt that she needs to wear this over her trach. Pt verbalized understanding, but took off as soon as it was put on. Pt refused to put back on.

## 2021-03-30 NOTE — Progress Notes (Signed)
Inpatient Rehabilitation Admissions Coordinator  I will follow up on Monday with [facility] Medicare determination for a possible CIR admit.  Danne Baxter, RN, MSN Rehab Admissions Coordinator [phone] 03/30/2021 2:26 PM

## 2021-03-30 NOTE — Progress Notes (Signed)
Occupational Therapy Treatment Patient Details Name: Desiree Madden MRN: [id number] DOB: Jul 31, 1951 Today's Date: 03/30/2021    History of present illness Desiree Madden is a 70 y.o. female with medical history significant for colonic bladder fistula, diverticulitis, hypertension, hypothyroidism, hyperlipidemia, ulcerative colitis, who presented to the hospital on 02/21/2021 because of abdominal pain.  She was admitted to the hospital for acute diverticulitis.  Hospital course was complicated by perforated diverticulitis and she underwent emergent sigmoid colectomy with end colostomy and Hartman's procedure on 03/01/2021.  She was transferred to the ICU postoperatively.  She developed septic shock requiring multiple vasopressors.  She remained intubated on mechanical ventilation postoperatively.  She could not be liberated from the ventilator.  She failed extubation on 03/06/2021 and was reintubated.  ENT was consulted and tracheostomy was performed on 03/08/2021.   Ultimately extubated 4/8.   OT comments  Upon entering the room, pt supine in bed and reports fatigue from yesterday. Pt did need some encouragement this morning for OT intervention. Pt then reports feeling very nervous and anxious about therapy session and pt displaying better awareness for deficits which could also be making her feel this way. OT providing therapeutic use of self and encouraged pt to participate. Pt performed bed mobility with mod A to EOB. Pt performing pursed lip breathing exercises with min cuing for technique and close supervision for static sitting balance ~ 5 minutes. Pt standing with RW and mod lifting assistance. Pt taking several steps forwards to sink and standing with min A to brush teeth. Pt does need set up A to obtain all needed items. After standing for ~ 8 minutes pt returning to sit on EOB secondary to fatigue. Pt continues to report feeling anxious and OT notified RN with medication given at end of session. Pt  returning to supine for nursing needs. OT assisted pt with donning B prevalon boots. Bed alarm activated and call bell within reach. Pt continues to benefit from OT intervention and continues to progress towards OT goals.   Follow Up Recommendations  CIR;Supervision/Assistance - 24 hour    Equipment Recommendations  Other (comment) (defer to next venue of care)       Precautions / Restrictions Precautions Precautions: Fall Precaution Comments: wound vac/trach/PMV       Mobility Bed Mobility Overal bed mobility: Needs Assistance Bed Mobility: Supine to Sit;Sit to Supine     Supine to sit: Mod assist Sit to supine: Mod assist   General bed mobility comments: Mod cuing for technique and encouragment. Assistance for L LE and trunk to exit L side of bed    Transfers Overall transfer level: Needs assistance Equipment used: Rolling walker (2 wheeled) Transfers: Sit to/from Stand Sit to Stand: Mod assist         General transfer comment: mod lifting assistance to stand from standard height    Balance Overall balance assessment: Needs assistance Sitting-balance support: Feet supported Sitting balance-Leahy Scale: Fair       Standing balance-Leahy Scale: Poor Standing balance comment: reliant on BUE support on RW and min assist to maintain balance                           ADL either performed or assessed with clinical judgement   ADL Overall ADL's : Needs assistance/impaired     Grooming: Oral care;Minimal assistance Grooming Details (indicate cue type and reason): Min A for standing balance. set up A with min cuing to brush teeth  Vision Patient Visual Report: No change from baseline            Cognition Arousal/Alertness: Awake/alert Behavior During Therapy: Restless;Anxious Overall Cognitive Status: Impaired/Different from baseline Area of Impairment: Orientation;Following  commands;Safety/judgement;Problem solving;Awareness;Attention                 Orientation Level: Disoriented to;Time;Situation     Following Commands: Follows one step commands with increased time;Follows one step commands consistently;Follows multi-step commands inconsistently Safety/Judgement: Decreased awareness of safety;Decreased awareness of deficits Awareness: Intellectual   General Comments: Pt is A and O x 2. much more cooperative and motivated                   Pertinent Vitals/ Pain       Pain Assessment: No/denies pain  Home Living Family/patient expects to be discharged to:: Private residence                                            Frequency  Min 3X/week        Progress Toward Goals  OT Goals(current goals can now be found in the care plan section)  Progress towards OT goals: Progressing toward goals  Acute Rehab OT Goals Patient Stated Goal: to get better OT Goal Formulation: With patient Time For Goal Achievement: 04/04/21 Potential to Achieve Goals: Good  Plan Discharge plan remains appropriate;Frequency remains appropriate       AM-PAC OT "6 Clicks" Daily Activity     Outcome Measure   Help from another person eating meals?: A Little Help from another person taking care of personal grooming?: A Little Help from another person toileting, which includes using toliet, bedpan, or urinal?: A Lot Help from another person bathing (including washing, rinsing, drying)?: A Lot Help from another person to put on and taking off regular upper body clothing?: A Little Help from another person to put on and taking off regular lower body clothing?: A Lot 6 Click Score: 15    End of Session Equipment Utilized During Treatment: Other (comment) (trach)  OT Visit Diagnosis: Unsteadiness on feet (R26.81);Muscle weakness (generalized) (M62.81);Other symptoms and signs involving cognitive function   Activity Tolerance Other (comment)  (very anxious)   Patient Left with bed alarm set;with call bell/phone within reach;in bed   Nurse Communication Mobility status;Precautions;Other (comment) (request for anxiety medication)        Time: 8676-1950 OT Time Calculation (min): 38 min  Charges: OT General Charges $OT Visit: 1 Visit OT Treatments $Self Care/Home Management : 23-37 mins $Therapeutic Activity: 8-22 mins  Darleen Crocker, MS, OTR/L , CBIS ascom [phone]  03/30/21, 1:08 PM

## 2021-03-31 DIAGNOSIS — K5792 Diverticulitis of intestine, part unspecified, without perforation or abscess without bleeding: Secondary | ICD-10-CM | POA: Diagnosis not present

## 2021-03-31 DIAGNOSIS — E43 Unspecified severe protein-calorie malnutrition: Secondary | ICD-10-CM | POA: Diagnosis not present

## 2021-03-31 DIAGNOSIS — K51913 Ulcerative colitis, unspecified with fistula: Secondary | ICD-10-CM | POA: Diagnosis not present

## 2021-03-31 LAB — BASIC METABOLIC PANEL
Anion gap: 11 (ref 5–15)
BUN: 28 mg/dL — ABNORMAL HIGH (ref 8–23)
CO2: 26 mmol/L (ref 22–32)
Calcium: 8.9 mg/dL (ref 8.9–10.3)
Chloride: 99 mmol/L (ref 98–111)
Creatinine, Ser: 1.03 mg/dL — ABNORMAL HIGH (ref 0.44–1.00)
GFR, Estimated: 58 mL/min — ABNORMAL LOW (ref >60)
Glucose, Bld: 91 mg/dL (ref 70–99)
Potassium: 3.6 mmol/L (ref 3.5–5.1)
Sodium: 136 mmol/L (ref 135–145)

## 2021-03-31 LAB — MAGNESIUM: Magnesium: 2 mg/dL (ref 1.7–2.4)

## 2021-03-31 LAB — GLUCOSE, CAPILLARY
Glucose-Capillary: 121 mg/dL — ABNORMAL HIGH (ref 70–99)
Glucose-Capillary: 85 mg/dL (ref 70–99)
Glucose-Capillary: 95 mg/dL (ref 70–99)

## 2021-03-31 MED ORDER — INSULIN ASPART 100 UNIT/ML ~~LOC~~ SOLN
0.0000 [IU] | Freq: Every day | SUBCUTANEOUS | Status: DC
  Administered 2021-03-31: 16:00:00 1 [IU] via SUBCUTANEOUS

## 2021-03-31 MED ORDER — DRONABINOL 2.5 MG PO CAPS
2.5000 mg | ORAL_CAPSULE | Freq: Two times a day (BID) | ORAL | Status: DC
  Administered 2021-03-31 – 2021-04-02 (×5): 2.5 mg via ORAL
  Filled 2021-03-31 (×5): qty 1

## 2021-03-31 NOTE — Progress Notes (Signed)
Secure chat with Lurline Hare re PICC dressing change.  Estill Bamberg RN to change dressing today.

## 2021-03-31 NOTE — Progress Notes (Addendum)
PROGRESS NOTE    Desiree Madden   ZDG:[id number]  DOB: [birth year]  PCP: [place]    DOA: 02/21/2021 LOS: 72   Brief Narrative   Desiree Madden is a 70 y.o. female with medical history significant for colonic bladder fistula, diverticulitis, hypertension, hypothyroidism, hyperlipidemia, ulcerative colitis, who presented to the hospital on 02/21/2021 because of abdominal pain.  She was admitted with acute diverticulitis.    Hospital course was complicated by perforated diverticulitis and she underwent emergent sigmoid colectomy with end colostomy and Hartman's procedure on 03/01/2021.  She was transferred to the ICU postoperatively, developed septic shock requiring multiple vasopressors.  She remained intubated on mechanical ventilation postoperatively and subsequent difficulty liberating pt from the ventilator, failed extubation on 03/06/2021 and was reintubated.   ENT was consulted and tracheostomy was performed on 03/08/2021.    Assessment & Plan   Principal Problem:   Diverticulitis Active Problems:   Fistula, bladder   Ulcerative colitis ([place])   Hyperlipidemia   Essential hypertension   Hypothyroidism   Abnormal ECG   CKD (chronic kidney disease), stage III (HCC)   Elevated lactic acid level   Protein-calorie malnutrition, severe   Septic shock, hemorrhagic shock secondary to complicated diverticulitis with perforation, pelvic abscess, colo-vesicular fistula, gangrene of the sigmoid colon: Shock physiology resolved, ICU course as outlined above.   S/p sigmoid colectomy with end colostomy, Hartman's procedure on 03/01/2021. Completed antibiotics on 03/15/2021.   --Wound VAC in place --Continue local wound care --Colostomy care --Follow-up with general surgery for further recommendations.  Dysphagia - improved/resolved. Pt denies having issues, but initially was due to trach. Anorexia - very poor appetite, slowly improving Severe malnutrition - tolerating dysphagia  1 diet and nectar thick liquids --encourage PO intake  --appreciate dietician and SLP recommendations --started on Marinol for appetite stimulation, Remeron also for depression and sleep --If PEG tube needed, either GI or radiology consult  Acute on chronic hypoxic respiratory failure with tracheostomy placed - Failure to wean from ventilator requiring tracheostomy.  Patient has been tolerating room air since 4/19.  Uses TC overnight only for humidification. --continue humidification if pt prefers --my colleague, Dr. Mal Misty, discussed with ENT Dr. Richardson Landry re trach removal.  Want to ensure stable respiratory status and not requiring any support prior to removal.  Possible removal early next week (now this week). --Will touch base again with ENT Monday  Hypokalemia / Hypomagnesemia - Replaced K and Mg.  --Monitor and further replacement as needed  Non-anion gap metabolic acidosis - Improved. Mildly elevated creatinine: Encourage adequate oral hydration.  SVT, nonsustained, Sinus tachycardia, paroxysmal atrial fibrillation -  --Continue amiodarone and Eliquis.  NSTEMI, ischemic cardiomyopathy, chronic systolic and diastolic CHF - appears well compensated.  2D echo showed EF estimated at 20 to 25% and grade 1 diastolic dysfunction. --Continue Toprol-XL  Acute urinary retention - Urology repaired colo-vesicular fistula.  Cystogram was done 4/19 and there was no evidence of leak.  Foley removed. 4/21: Patient was able to spontaneously void today 4/22: Patient continues to retain, Foley replaced --appreciate urology recommendations --Completed two days antibiotics with cysto for prophylaxis, per urology   Tracheostomy in place - follow ENT Dr. Richardson Landry.  Possible removal early next week if respiratory status remains stable.  -- Confirm follow-up while at CIR   Moderately severe depression -multifactorial.  Chart reviewed, PCP note of 01/16/2021: PHQ-9 score was 15 consistent with  moderately severe depression.  This now likely complicated by all of above, having trach, prolonged hospital stay.  Patient and daughter agreeable to try on an antidepressant. -- Started on low-dose of Remeron qHS   (May also help with appetite and sleep) -- Close outpatient follow-up  Generalized weakness -related to her lengthy hospital admission.  Insurance authorization is pending for acute inpatient rehab. -- Continue PT OT while here  Patient BMI: Body mass index is 22.24 kg/m.   DVT prophylaxis: SCDs Start: 02/21/21 2200 apixaban (ELIQUIS) tablet 5 mg   Diet:  Diet Orders (From admission, onward)    Start     Ordered   03/28/21 1433  DIET - DYS 1 Room service appropriate? Yes with Assist; Fluid consistency: Nectar Thick  Diet effective now       Comments: Just a Puree Diet. May have Oatmeal per Speech w/ butter, sugar. Yogurt, pudding, magic cup on trays. NO STRAWS.  Question Answer Comment  Room service appropriate? Yes with Assist   Fluid consistency: Nectar Thick      03/28/21 1433            Code Status: DNR    Subjective 03/31/21    Patient awake sitting up in bed when seen this morning.  Daughter at bedside.  Pt reports mildly improving appetite.  Not sleeping well.  In better spirits today.  We talked about the need for more nutrition and feeding tube can help with that.  Pt and daughter feel her appetite is improving and want to give more time for her to eat better.  Discussed trial of Marinol to help appetite and pt agreeable.   Disposition Plan & Communication   Status is: Inpatient  Remains inpatient appropriate because: Patient requires acute rehab, awaiting evaluation and insurance authorization.  In adequate PO intake for sustenance.   Dispo: The patient is from: Home              Anticipated d/c is to: CIR              Patient currently is medically stable for discharge   Difficult to place patient No  [employer] requesting peer-to-peer for CIR  authorization.  Did not receive a call back after voicemail left Friday afternoon. Will call again Monday.  [phone] ext [phone], Policy #[id number]   Family Communication: Daughter [name] was at bedside on rounds today 4/23.   Consults, Procedures, Significant Events   Consultants:   General surgery  Urology  ENT  PCCM  Procedures:   Tracheostomy 03/08/2021  Sigmoid colectomy with end colostomy, Hartman's procedure 03/01/2021  Cystorrhaphy by urology on 3/24 for repair of colovesicular fistula  Antimicrobials:  Anti-infectives (From admission, onward)   Start     Dose/Rate Route Frequency Ordered Stop   03/27/21 2200  amoxicillin (AMOXIL) capsule 500 mg        500 mg Oral Every 8 hours 03/27/21 1401 03/29/21 1727   03/13/21 1400  meropenem (MERREM) 1 g in sodium chloride 0.9 % 100 mL IVPB        1 g 200 mL/hr over 30 Minutes Intravenous Every 8 hours 03/13/21 0855 03/16/21 2203   03/12/21 1800  meropenem (MERREM) 1 g in sodium chloride 0.9 % 100 mL IVPB  Status:  Discontinued        1 g 200 mL/hr over 30 Minutes Intravenous Every 12 hours 03/12/21 0840 03/13/21 0855   03/10/21 1000  meropenem (MERREM) 1 g in sodium chloride 0.9 % 100 mL IVPB  Status:  Discontinued        1 g 200 mL/hr over 30  Minutes Intravenous Every 8 hours 03/10/21 0854 03/12/21 0840   03/09/21 0600  fluconazole (DIFLUCAN) IVPB 200 mg  Status:  Discontinued        200 mg 100 mL/hr over 60 Minutes Intravenous Every 24 hours 03/08/21 0751 03/14/21 1014   03/08/21 2200  meropenem (MERREM) 1 g in sodium chloride 0.9 % 100 mL IVPB  Status:  Discontinued        1 g 200 mL/hr over 30 Minutes Intravenous Every 12 hours 03/08/21 0748 03/10/21 0854   03/08/21 0600  fluconazole (DIFLUCAN) IVPB 400 mg  Status:  Discontinued        400 mg 100 mL/hr over 120 Minutes Intravenous Every 24 hours 03/07/21 1321 03/08/21 0839   03/07/21 1100  meropenem (MERREM) 1 g in sodium chloride 0.9 % 100 mL IVPB  Status:   Discontinued        1 g 200 mL/hr over 30 Minutes Intravenous Every 8 hours 03/07/21 1013 03/08/21 0748   03/07/21 1000  meropenem (MERREM) 1 g in sodium chloride 0.9 % 100 mL IVPB  Status:  Discontinued        1 g 200 mL/hr over 30 Minutes Intravenous Every 12 hours 03/06/21 2243 03/07/21 1013   03/07/21 0600  fluconazole (DIFLUCAN) IVPB 400 mg        400 mg 100 mL/hr over 120 Minutes Intravenous  Once 03/06/21 2243 03/07/21 0701   03/04/21 1800  vancomycin (VANCOREADY) IVPB 750 mg/150 mL        750 mg 150 mL/hr over 60 Minutes Intravenous Every 24 hours 03/03/21 1442 03/06/21 2040   03/03/21 0800  vancomycin (VANCOREADY) IVPB 1500 mg/300 mL  Status:  Discontinued        1,500 mg 150 mL/hr over 120 Minutes Intravenous Every 48 hours 03/02/21 1410 03/03/21 1442   03/02/21 0800  vancomycin (VANCOREADY) IVPB 750 mg/150 mL  Status:  Discontinued        750 mg 150 mL/hr over 60 Minutes Intravenous Every 24 hours 03/01/21 1954 03/02/21 1409   03/01/21 2200  ceFEPIme (MAXIPIME) 2 g in sodium chloride 0.9 % 100 mL IVPB        2 g 200 mL/hr over 30 Minutes Intravenous Every 12 hours 03/01/21 1903 03/06/21 2203   03/01/21 2100  vancomycin (VANCOREADY) IVPB 1250 mg/250 mL        1,250 mg 166.7 mL/hr over 90 Minutes Intravenous  Once 03/01/21 1954 03/02/21 0008   03/01/21 2000  metroNIDAZOLE (FLAGYL) IVPB 500 mg        500 mg 100 mL/hr over 60 Minutes Intravenous Every 8 hours 03/01/21 1903 03/06/21 2002   02/28/21 1800  piperacillin-tazobactam (ZOSYN) IVPB 3.375 g  Status:  Discontinued        3.375 g 12.5 mL/hr over 240 Minutes Intravenous Every 8 hours 02/28/21 1643 03/01/21 1903   02/28/21 1730  piperacillin-tazobactam (ZOSYN) IVPB 3.375 g  Status:  Discontinued        3.375 g 100 mL/hr over 30 Minutes Intravenous Every 8 hours 02/28/21 1635 02/28/21 1642   02/28/21 1300  Ampicillin-Sulbactam (UNASYN) 3 g in sodium chloride 0.9 % 100 mL IVPB  Status:  Discontinued        3 g 200 mL/hr  over 30 Minutes Intravenous Every 6 hours 02/28/21 1156 02/28/21 1635   02/23/21 1230  piperacillin-tazobactam (ZOSYN) IVPB 3.375 g  Status:  Discontinued        3.375 g 12.5 mL/hr over 240 Minutes Intravenous Every 8 hours  02/23/21 1143 02/28/21 1155   02/22/21 0600  ciprofloxacin (CIPRO) IVPB 400 mg  Status:  Discontinued        400 mg 200 mL/hr over 60 Minutes Intravenous Every 12 hours 02/21/21 2028 02/23/21 1143   02/22/21 0400  metroNIDAZOLE (FLAGYL) IVPB 500 mg  Status:  Discontinued        500 mg 100 mL/hr over 60 Minutes Intravenous Every 8 hours 02/21/21 2028 02/23/21 1143   02/21/21 1830  ciprofloxacin (CIPRO) IVPB 400 mg        400 mg 200 mL/hr over 60 Minutes Intravenous  Once 02/21/21 1820 02/21/21 1939   02/21/21 1830  metroNIDAZOLE (FLAGYL) IVPB 500 mg        500 mg 100 mL/hr over 60 Minutes Intravenous  Once 02/21/21 1820 02/21/21 2110        Micro    Objective   Vitals:   03/31/21 0741 03/31/21 0838 03/31/21 1257 03/31/21 1616  BP:  114/61 (!) 112/58 115/69  Pulse:  80 88 85  Resp:  16 14 16   Temp:  97.8 F (36.6 C) 97.8 F (36.6 C) 98.6 F (37 C)  TempSrc:  Oral Oral Oral  SpO2: 97% 100% 100% 100%  Weight:      Height:        Intake/Output Summary (Last 24 hours) at 03/31/2021 1647 Last data filed at 03/31/2021 1457 Gross per 24 hour  Intake 360 ml  Output 525 ml  Net -165 ml   Filed Weights   03/29/21 0457 03/30/21 0456 03/31/21 0513  Weight: 61.9 kg 58.5 kg 58.8 kg    Physical Exam:  General exam: awake, alert, no acute distress, frail HEENT: Trach in place, speaking well without use of PMV, hearing grossly normal, single cough with no secretions  Respiratory system: normal respiratory effort, on room air, CTAB. Cardiovascular system: normal S1/S2, RRR, no pedal edema.   Gastrointestinal system: midline incision site with Wound Vac in place, left side colostomy bag in place, abdomen soft  Central nervous system: A&O x4. normal  speech Psychiatry: normal mood, congruent affect, judgement and insight appear normal  Labs   Data Reviewed: I have personally reviewed following labs and imaging studies  CBC: Recent Labs  Lab 03/26/21 0321 03/29/21 0525  WBC 6.2 6.9  HGB 9.6* 9.0*  HCT 29.6* 27.3*  MCV 93.7 94.1  PLT 381 037   Basic Metabolic Panel: Recent Labs  Lab 03/26/21 0321 03/26/21 0500 03/27/21 0522 03/28/21 0537 03/29/21 0525 03/30/21 0503 03/31/21 0904  NA 140  --  138 138 137 137 136  K 2.5*  --  4.2 3.8 3.5 3.8 3.6  CL 116*  --  105 105 103 103 99  CO2 18*  --  24 26 26 25 26   GLUCOSE 82  --  104* 109* 95 89 91  BUN 26*  --  37* 35* 37* 32* 28*  CREATININE 0.68  --  1.05* 0.99 1.00 0.97 1.03*  CALCIUM 6.5*  --  8.9 9.1 9.0 9.0 8.9  MG  --    < > 2.2 2.0 2.0 1.8 2.0  PHOS 3.2  --  4.2  --  4.6  --   --    < > = values in this interval not displayed.   GFR: Estimated Creatinine Clearance: 43.9 mL/min (A) (by C-G formula based on SCr of 1.03 mg/dL (H)). Liver Function Tests: No results for input(s): AST, ALT, ALKPHOS, BILITOT, PROT, ALBUMIN in the last 168 hours. No results  for input(s): LIPASE, AMYLASE in the last 168 hours. No results for input(s): AMMONIA in the last 168 hours. Coagulation Profile: No results for input(s): INR, PROTIME in the last 168 hours. Cardiac Enzymes: No results for input(s): CKTOTAL, CKMB, CKMBINDEX, TROPONINI in the last 168 hours. BNP (last 3 results) No results for input(s): PROBNP in the last 8760 hours. HbA1C: No results for input(s): HGBA1C in the last 72 hours. CBG: Recent Labs  Lab 03/30/21 2118 03/30/21 2156 03/31/21 0039 03/31/21 0835 03/31/21 1619  GLUCAP 75 83 95 85 121*   Lipid Profile: No results for input(s): CHOL, HDL, LDLCALC, TRIG, CHOLHDL, LDLDIRECT in the last 72 hours. Thyroid Function Tests: No results for input(s): TSH, T4TOTAL, FREET4, T3FREE, THYROIDAB in the last 72 hours. Anemia Panel: No results for input(s):  VITAMINB12, FOLATE, FERRITIN, TIBC, IRON, RETICCTPCT in the last 72 hours. Sepsis Labs: No results for input(s): PROCALCITON, LATICACIDVEN in the last 168 hours.  Recent Results (from the past 240 hour(s))  Resp Panel by RT-PCR (Flu A&B, Covid) Nasopharyngeal Swab     Status: None   Collection Time: 03/26/21 10:29 AM   Specimen: Nasopharyngeal Swab; Nasopharyngeal(NP) swabs in vial transport medium  Result Value Ref Range Status   SARS Coronavirus 2 by RT PCR NEGATIVE NEGATIVE Final    Comment: (NOTE) SARS-CoV-2 target nucleic acids are NOT DETECTED.  The SARS-CoV-2 RNA is generally detectable in upper respiratory specimens during the acute phase of infection. The lowest concentration of SARS-CoV-2 viral copies this assay can detect is 138 copies/mL. A negative result does not preclude SARS-Cov-2 infection and should not be used as the sole basis for treatment or other patient management decisions. A negative result may occur with  improper specimen collection/handling, submission of specimen other than nasopharyngeal swab, presence of viral mutation(s) within the areas targeted by this assay, and inadequate number of viral copies(<138 copies/mL). A negative result must be combined with clinical observations, patient history, and epidemiological information. The expected result is Negative.  Fact Sheet for Patients:  EntrepreneurPulse.com.au  Fact Sheet for Healthcare Providers:  IncredibleEmployment.be  This test is no t yet approved or cleared by the [place] FDA and  has been authorized for detection and/or diagnosis of SARS-CoV-2 by FDA under an Emergency Use Authorization (EUA). This EUA will remain  in effect (meaning this test can be used) for the duration of the COVID-19 declaration under Section 564(b)(1) of the Act, 21 U.S.C.section 360bbb-3(b)(1), unless the authorization is terminated  or revoked sooner.       Influenza A  by PCR NEGATIVE NEGATIVE Final   Influenza B by PCR NEGATIVE NEGATIVE Final    Comment: (NOTE) The Xpert Xpress SARS-CoV-2/FLU/RSV plus assay is intended as an aid in the diagnosis of influenza from Nasopharyngeal swab specimens and should not be used as a sole basis for treatment. Nasal washings and aspirates are unacceptable for Xpert Xpress SARS-CoV-2/FLU/RSV testing.  Fact Sheet for Patients: EntrepreneurPulse.com.au  Fact Sheet for Healthcare Providers: IncredibleEmployment.be  This test is not yet approved or cleared by the [place] FDA and has been authorized for detection and/or diagnosis of SARS-CoV-2 by FDA under an Emergency Use Authorization (EUA). This EUA will remain in effect (meaning this test can be used) for the duration of the COVID-19 declaration under Section 564(b)(1) of the Act, 21 U.S.C. section 360bbb-3(b)(1), unless the authorization is terminated or revoked.  Performed at [facility], [address], [place], [place] [address]       Imaging Studies  No results found.   Medications   Scheduled Meds: . amiodarone  200 mg Oral BID  . apixaban  5 mg Oral BID  . vitamin C  500 mg Oral BID  . chlorhexidine gluconate (MEDLINE KIT)  15 mL Mouth Rinse BID  . Chlorhexidine Gluconate Cloth  6 each Topical Daily  . docusate  100 mg Oral BID  . dronabinol  2.5 mg Oral BID AC  . feeding supplement (NEPRO CARB STEADY)  237 mL Oral TID BM  . [START ON 04/01/2021] insulin aspart  0-9 Units Subcutaneous Daily  . levothyroxine  75 mcg Oral Q0600  . mouth rinse  15 mL Mouth Rinse BID  . metoprolol succinate  12.5 mg Oral Daily  . mirtazapine  15 mg Oral QHS  . multivitamin with minerals  1 tablet Oral Daily  . pantoprazole  40 mg Oral Daily  . polyethylene glycol  17 g Oral Daily  . sodium chloride flush  10-40 mL Intracatheter Q12H   Continuous Infusions: . sodium chloride Stopped (03/20/21 1026)        LOS: 38 days    Time spent: 30 minutes     Ezekiel Slocumb, DO [facility]  03/31/2021, 4:47 PM      If 7PM-7AM, please contact night-coverage. How to contact the [facility] Attending or Consulting provider [place] or covering provider during after hours [place], for this patient?    1. Check the care team in [facility] and look for a) attending/consulting [facility] provider listed and b) the [facility] team listed 2. Log into [email or web address] and use [place]'s universal password to access. If you do not have the password, please contact the hospital operator. 3. Locate the [facility] provider you are looking for under [facility] and page to a number that you can be directly reached. 4. If you still have difficulty reaching the provider, please page the [facility] (Director on Call) for the Hospitalists listed on amion for assistance.

## 2021-04-01 DIAGNOSIS — N322 Vesical fistula, not elsewhere classified: Secondary | ICD-10-CM | POA: Diagnosis not present

## 2021-04-01 DIAGNOSIS — K5792 Diverticulitis of intestine, part unspecified, without perforation or abscess without bleeding: Secondary | ICD-10-CM | POA: Diagnosis not present

## 2021-04-01 DIAGNOSIS — E43 Unspecified severe protein-calorie malnutrition: Secondary | ICD-10-CM | POA: Diagnosis not present

## 2021-04-01 LAB — CBC
HCT: 26.9 % — ABNORMAL LOW (ref 36.0–46.0)
Hemoglobin: 9 g/dL — ABNORMAL LOW (ref 12.0–15.0)
MCH: 30.5 pg (ref 26.0–34.0)
MCHC: 33.5 g/dL (ref 30.0–36.0)
MCV: 91.2 fL (ref 80.0–100.0)
Platelets: 323 10*3/uL (ref 150–400)
RBC: 2.95 MIL/uL — ABNORMAL LOW (ref 3.87–5.11)
RDW: 14.9 % (ref 11.5–15.5)
WBC: 7.3 10*3/uL (ref 4.0–10.5)
nRBC: 0 % (ref 0.0–0.2)

## 2021-04-01 LAB — GLUCOSE, CAPILLARY: Glucose-Capillary: 74 mg/dL (ref 70–99)

## 2021-04-01 NOTE — Progress Notes (Addendum)
PROGRESS NOTE    Angelik Walls   OHY:[id number]  DOB: 10-30-1951  PCP: [facility]    DOA: 02/21/2021 LOS: 63   Brief Narrative   Desiree Madden is a 70 y.o. female with medical history significant for colonic bladder fistula, diverticulitis, hypertension, hypothyroidism, hyperlipidemia, ulcerative colitis, who presented to the hospital on 02/21/2021 because of abdominal pain.  She was admitted with acute diverticulitis.    Hospital course was complicated by perforated diverticulitis and she underwent emergent sigmoid colectomy with end colostomy and Hartman's procedure on 03/01/2021.  She was transferred to the ICU postoperatively, developed septic shock requiring multiple vasopressors.  She remained intubated on mechanical ventilation postoperatively and subsequent difficulty liberating pt from the ventilator, failed extubation on 03/06/2021 and was reintubated.   ENT was consulted and tracheostomy was performed on 03/08/2021.    Assessment & Plan   Principal Problem:   Diverticulitis Active Problems:   Fistula, bladder   Ulcerative colitis ([place])   Hyperlipidemia   Essential hypertension   Hypothyroidism   Abnormal ECG   CKD (chronic kidney disease), stage III (HCC)   Elevated lactic acid level   Protein-calorie malnutrition, severe   Septic shock, hemorrhagic shock secondary to complicated diverticulitis with perforation, pelvic abscess, colo-vesicular fistula, gangrene of the sigmoid colon: Shock physiology resolved, ICU course as outlined above.   S/p sigmoid colectomy with end colostomy, Hartman's procedure on 03/01/2021. Completed antibiotics on 03/15/2021.   --Wound VAC in place --Continue local wound care --Colostomy care --Follow-up with general surgery for further recommendations.  Dysphagia - improving Anorexia - very poor appetite, slowly improving Severe malnutrition - tolerating dysphagia 1 diet and nectar thick liquids 4/24: PO intake is improving  with new medications  --encourage PO intake  --appreciate dietician and SLP recommendations --started on Marinol, Remeron also for depression and sleep --If PEG tube needed, either GI or radiology consult  Acute on chronic hypoxic respiratory failure with tracheostomy placed - Failure to wean from ventilator requiring tracheostomy.  Patient has been tolerating room air since 4/19.  Uses TC overnight only for humidification. --continue humidification if pt prefers --my colleague, Dr. Mal Misty, discussed with ENT Dr. Richardson Landry re trach removal.  Want to ensure stable respiratory status and not requiring any support prior to removal.  Possible removal early next week (now this week). --Touch base again with ENT for follow up plan post-dc  Hypokalemia / Hypomagnesemia - Replaced K and Mg.  --Monitor and further replacement as needed  Non-anion gap metabolic acidosis - Improved. Mildly elevated creatinine: Encourage adequate oral hydration.  SVT, nonsustained, Sinus tachycardia, paroxysmal atrial fibrillation -  --Continue amiodarone and Eliquis.  NSTEMI, ischemic cardiomyopathy, chronic systolic and diastolic CHF - appears well compensated.  2D echo showed EF estimated at 20 to 25% and grade 1 diastolic dysfunction. --Continue Toprol-XL  Acute urinary retention - Urology repaired colo-vesicular fistula.  Cystogram was done 4/19 and there was no evidence of leak.  Foley removed, but replaced 4/22 due to ongoing retention requiring in/out cath repeatedly. --appreciate urology recommendations --Completed two days antibiotics with cysto for prophylaxis, per urology   Tracheostomy in place - follow ENT Dr. Richardson Landry.  Possible removal early next week if respiratory status remains stable.  -- Confirm follow-up while at CIR   Moderately severe depression -multifactorial.  Chart reviewed, PCP note of 01/16/2021: PHQ-9 score was 15 consistent with moderately severe depression.  This now likely  complicated by all of above, having trach, prolonged hospital stay.  Patient and daughter agreeable  to try on an antidepressant. -- Started on low-dose of Remeron qHS   (May also help with appetite and sleep) -- Close outpatient follow-up  Generalized weakness -related to her lengthy hospital admission.  Insurance authorization is pending for acute inpatient rehab. -- Continue PT OT while here  Patient BMI: Body mass index is 22.85 kg/m.   DVT prophylaxis: SCDs Start: 02/21/21 2200 apixaban (ELIQUIS) tablet 5 mg   Diet:  Diet Orders (From admission, onward)    Start     Ordered   03/28/21 1433  DIET - DYS 1 Room service appropriate? Yes with Assist; Fluid consistency: Nectar Thick  Diet effective now       Comments: Just a Puree Diet. May have Oatmeal per Speech w/ butter, sugar. Yogurt, pudding, magic cup on trays. NO STRAWS.  Question Answer Comment  Room service appropriate? Yes with Assist   Fluid consistency: Nectar Thick      03/28/21 1433            Code Status: DNR    Subjective 04/01/21    Patient reports improved appetite with trial of Marinol, ate quick a bit of her breakfast including eggs, grits, and says drinking supplement shakes.  Says mood is better as well, feels less anxious in general.  No other acute complaints.     Disposition Plan & Communication   Status is: Inpatient  Remains inpatient appropriate because: Patient requires acute rehab, awaiting evaluation and insurance authorization.  In adequate PO intake for sustenance.   Dispo: The patient is from: Home              Anticipated d/c is to: CIR              Patient currently is medically stable for discharge   Difficult to place patient No  [employer] requesting peer-to-peer for CIR authorization.  Did not receive a call back after voicemail left Friday afternoon. Will call again Monday.  [phone] ext [phone], Policy #[id number]   Family Communication: Daughter [name] was at bedside on  rounds 4/23.   Consults, Procedures, Significant Events   Consultants:   General surgery  Urology  ENT  PCCM  Procedures:   Tracheostomy 03/08/2021  Sigmoid colectomy with end colostomy, Hartman's procedure 03/01/2021  Cystorrhaphy by urology on 3/24 for repair of colovesicular fistula  Antimicrobials:  Anti-infectives (From admission, onward)   Start     Dose/Rate Route Frequency Ordered Stop   03/27/21 2200  amoxicillin (AMOXIL) capsule 500 mg        500 mg Oral Every 8 hours 03/27/21 1401 03/29/21 1727   03/13/21 1400  meropenem (MERREM) 1 g in sodium chloride 0.9 % 100 mL IVPB        1 g 200 mL/hr over 30 Minutes Intravenous Every 8 hours 03/13/21 0855 03/16/21 2203   03/12/21 1800  meropenem (MERREM) 1 g in sodium chloride 0.9 % 100 mL IVPB  Status:  Discontinued        1 g 200 mL/hr over 30 Minutes Intravenous Every 12 hours 03/12/21 0840 03/13/21 0855   03/10/21 1000  meropenem (MERREM) 1 g in sodium chloride 0.9 % 100 mL IVPB  Status:  Discontinued        1 g 200 mL/hr over 30 Minutes Intravenous Every 8 hours 03/10/21 0854 03/12/21 0840   03/09/21 0600  fluconazole (DIFLUCAN) IVPB 200 mg  Status:  Discontinued        200 mg 100 mL/hr over 60 Minutes Intravenous Every  24 hours 03/08/21 0751 03/14/21 1014   03/08/21 2200  meropenem (MERREM) 1 g in sodium chloride 0.9 % 100 mL IVPB  Status:  Discontinued        1 g 200 mL/hr over 30 Minutes Intravenous Every 12 hours 03/08/21 0748 03/10/21 0854   03/08/21 0600  fluconazole (DIFLUCAN) IVPB 400 mg  Status:  Discontinued        400 mg 100 mL/hr over 120 Minutes Intravenous Every 24 hours 03/07/21 1321 03/08/21 0839   03/07/21 1100  meropenem (MERREM) 1 g in sodium chloride 0.9 % 100 mL IVPB  Status:  Discontinued        1 g 200 mL/hr over 30 Minutes Intravenous Every 8 hours 03/07/21 1013 03/08/21 0748   03/07/21 1000  meropenem (MERREM) 1 g in sodium chloride 0.9 % 100 mL IVPB  Status:  Discontinued        1 g 200  mL/hr over 30 Minutes Intravenous Every 12 hours 03/06/21 2243 03/07/21 1013   03/07/21 0600  fluconazole (DIFLUCAN) IVPB 400 mg        400 mg 100 mL/hr over 120 Minutes Intravenous  Once 03/06/21 2243 03/07/21 0701   03/04/21 1800  vancomycin (VANCOREADY) IVPB 750 mg/150 mL        750 mg 150 mL/hr over 60 Minutes Intravenous Every 24 hours 03/03/21 1442 03/06/21 2040   03/03/21 0800  vancomycin (VANCOREADY) IVPB 1500 mg/300 mL  Status:  Discontinued        1,500 mg 150 mL/hr over 120 Minutes Intravenous Every 48 hours 03/02/21 1410 03/03/21 1442   03/02/21 0800  vancomycin (VANCOREADY) IVPB 750 mg/150 mL  Status:  Discontinued        750 mg 150 mL/hr over 60 Minutes Intravenous Every 24 hours 03/01/21 1954 03/02/21 1409   03/01/21 2200  ceFEPIme (MAXIPIME) 2 g in sodium chloride 0.9 % 100 mL IVPB        2 g 200 mL/hr over 30 Minutes Intravenous Every 12 hours 03/01/21 1903 03/06/21 2203   03/01/21 2100  vancomycin (VANCOREADY) IVPB 1250 mg/250 mL        1,250 mg 166.7 mL/hr over 90 Minutes Intravenous  Once 03/01/21 1954 03/02/21 0008   03/01/21 2000  metroNIDAZOLE (FLAGYL) IVPB 500 mg        500 mg 100 mL/hr over 60 Minutes Intravenous Every 8 hours 03/01/21 1903 03/06/21 2002   02/28/21 1800  piperacillin-tazobactam (ZOSYN) IVPB 3.375 g  Status:  Discontinued        3.375 g 12.5 mL/hr over 240 Minutes Intravenous Every 8 hours 02/28/21 1643 03/01/21 1903   02/28/21 1730  piperacillin-tazobactam (ZOSYN) IVPB 3.375 g  Status:  Discontinued        3.375 g 100 mL/hr over 30 Minutes Intravenous Every 8 hours 02/28/21 1635 02/28/21 1642   02/28/21 1300  Ampicillin-Sulbactam (UNASYN) 3 g in sodium chloride 0.9 % 100 mL IVPB  Status:  Discontinued        3 g 200 mL/hr over 30 Minutes Intravenous Every 6 hours 02/28/21 1156 02/28/21 1635   02/23/21 1230  piperacillin-tazobactam (ZOSYN) IVPB 3.375 g  Status:  Discontinued        3.375 g 12.5 mL/hr over 240 Minutes Intravenous Every 8 hours  02/23/21 1143 02/28/21 1155   02/22/21 0600  ciprofloxacin (CIPRO) IVPB 400 mg  Status:  Discontinued        400 mg 200 mL/hr over 60 Minutes Intravenous Every 12 hours 02/21/21 2028 02/23/21  1143   02/22/21 0400  metroNIDAZOLE (FLAGYL) IVPB 500 mg  Status:  Discontinued        500 mg 100 mL/hr over 60 Minutes Intravenous Every 8 hours 02/21/21 2028 02/23/21 1143   02/21/21 1830  ciprofloxacin (CIPRO) IVPB 400 mg        400 mg 200 mL/hr over 60 Minutes Intravenous  Once 02/21/21 1820 02/21/21 1939   02/21/21 1830  metroNIDAZOLE (FLAGYL) IVPB 500 mg        500 mg 100 mL/hr over 60 Minutes Intravenous  Once 02/21/21 1820 02/21/21 2110        Micro    Objective   Vitals:   04/01/21 0505 04/01/21 0808 04/01/21 1209 04/01/21 1550  BP: 110/67 113/66 116/61 117/69  Pulse: 74 73 79 76  Resp: 15 15 16 15   Temp: 98.4 F (36.9 C) 98.1 F (36.7 C) 98.1 F (36.7 C) 98.4 F (36.9 C)  TempSrc: Oral     SpO2: 97% 100% (!) 85% 100%  Weight: 60.4 kg     Height:        Intake/Output Summary (Last 24 hours) at 04/01/2021 1622 Last data filed at 04/01/2021 1614 Gross per 24 hour  Intake 240 ml  Output 1200 ml  Net -960 ml   Filed Weights   03/30/21 0456 03/31/21 0513 04/01/21 0505  Weight: 58.5 kg 58.8 kg 60.4 kg    Physical Exam:  General exam: awake, alert, no acute distress, frail, in good spirits HEENT: Trach in place with PMV in place, hearing grossly normal Respiratory system: normal respiratory effort, on room air Cardiovascular system: RRR, no pedal edema.   Gastrointestinal system: Wound Vac in place, left side colostomy bag in place Central nervous system: A&O x4. normal speech Psychiatry: normal mood, congruent affect, judgement and insight appear normal  Labs   Data Reviewed: I have personally reviewed following labs and imaging studies  CBC: Recent Labs  Lab 03/26/21 0321 03/29/21 0525 04/01/21 0455  WBC 6.2 6.9 7.3  HGB 9.6* 9.0* 9.0*  HCT 29.6* 27.3*  26.9*  MCV 93.7 94.1 91.2  PLT 381 367 841   Basic Metabolic Panel: Recent Labs  Lab 03/26/21 0321 03/26/21 0500 03/27/21 0522 03/28/21 0537 03/29/21 0525 03/30/21 0503 03/31/21 0904  NA 140  --  138 138 137 137 136  K 2.5*  --  4.2 3.8 3.5 3.8 3.6  CL 116*  --  105 105 103 103 99  CO2 18*  --  24 26 26 25 26   GLUCOSE 82  --  104* 109* 95 89 91  BUN 26*  --  37* 35* 37* 32* 28*  CREATININE 0.68  --  1.05* 0.99 1.00 0.97 1.03*  CALCIUM 6.5*  --  8.9 9.1 9.0 9.0 8.9  MG  --    < > 2.2 2.0 2.0 1.8 2.0  PHOS 3.2  --  4.2  --  4.6  --   --    < > = values in this interval not displayed.   GFR: Estimated Creatinine Clearance: 43.9 mL/min (A) (by C-G formula based on SCr of 1.03 mg/dL (H)). Liver Function Tests: No results for input(s): AST, ALT, ALKPHOS, BILITOT, PROT, ALBUMIN in the last 168 hours. No results for input(s): LIPASE, AMYLASE in the last 168 hours. No results for input(s): AMMONIA in the last 168 hours. Coagulation Profile: No results for input(s): INR, PROTIME in the last 168 hours. Cardiac Enzymes: No results for input(s): CKTOTAL, CKMB, CKMBINDEX, TROPONINI in  the last 168 hours. BNP (last 3 results) No results for input(s): PROBNP in the last 8760 hours. HbA1C: No results for input(s): HGBA1C in the last 72 hours. CBG: Recent Labs  Lab 03/30/21 2156 03/31/21 0039 03/31/21 0835 03/31/21 1619 04/01/21 1621  GLUCAP 83 95 85 121* 74   Lipid Profile: No results for input(s): CHOL, HDL, LDLCALC, TRIG, CHOLHDL, LDLDIRECT in the last 72 hours. Thyroid Function Tests: No results for input(s): TSH, T4TOTAL, FREET4, T3FREE, THYROIDAB in the last 72 hours. Anemia Panel: No results for input(s): VITAMINB12, FOLATE, FERRITIN, TIBC, IRON, RETICCTPCT in the last 72 hours. Sepsis Labs: No results for input(s): PROCALCITON, LATICACIDVEN in the last 168 hours.  Recent Results (from the past 240 hour(s))  Resp Panel by RT-PCR (Flu A&B, Covid) Nasopharyngeal Swab      Status: None   Collection Time: 03/26/21 10:29 AM   Specimen: Nasopharyngeal Swab; Nasopharyngeal(NP) swabs in vial transport medium  Result Value Ref Range Status   SARS Coronavirus 2 by RT PCR NEGATIVE NEGATIVE Final    Comment: (NOTE) SARS-CoV-2 target nucleic acids are NOT DETECTED.  The SARS-CoV-2 RNA is generally detectable in upper respiratory specimens during the acute phase of infection. The lowest concentration of SARS-CoV-2 viral copies this assay can detect is 138 copies/mL. A negative result does not preclude SARS-Cov-2 infection and should not be used as the sole basis for treatment or other patient management decisions. A negative result may occur with  improper specimen collection/handling, submission of specimen other than nasopharyngeal swab, presence of viral mutation(s) within the areas targeted by this assay, and inadequate number of viral copies(<138 copies/mL). A negative result must be combined with clinical observations, patient history, and epidemiological information. The expected result is Negative.  Fact Sheet for Patients:  EntrepreneurPulse.com.au  Fact Sheet for Healthcare Providers:  IncredibleEmployment.be  This test is no t yet approved or cleared by the [place] FDA and  has been authorized for detection and/or diagnosis of SARS-CoV-2 by FDA under an Emergency Use Authorization (EUA). This EUA will remain  in effect (meaning this test can be used) for the duration of the COVID-19 declaration under Section 564(b)(1) of the Act, 21 U.S.C.section 360bbb-3(b)(1), unless the authorization is terminated  or revoked sooner.       Influenza A by PCR NEGATIVE NEGATIVE Final   Influenza B by PCR NEGATIVE NEGATIVE Final    Comment: (NOTE) The Xpert Xpress SARS-CoV-2/FLU/RSV plus assay is intended as an aid in the diagnosis of influenza from Nasopharyngeal swab specimens and should not be used as a sole basis  for treatment. Nasal washings and aspirates are unacceptable for Xpert Xpress SARS-CoV-2/FLU/RSV testing.  Fact Sheet for Patients: EntrepreneurPulse.com.au  Fact Sheet for Healthcare Providers: IncredibleEmployment.be  This test is not yet approved or cleared by the [place] FDA and has been authorized for detection and/or diagnosis of SARS-CoV-2 by FDA under an Emergency Use Authorization (EUA). This EUA will remain in effect (meaning this test can be used) for the duration of the COVID-19 declaration under Section 564(b)(1) of the Act, 21 U.S.C. section 360bbb-3(b)(1), unless the authorization is terminated or revoked.  Performed at [facility], [address], [place], [place] [address]       Imaging Studies   No results found.   Medications   Scheduled Meds: . amiodarone  200 mg Oral BID  . apixaban  5 mg Oral BID  . vitamin C  500 mg Oral BID  . chlorhexidine gluconate (MEDLINE KIT)  15 mL Mouth Rinse BID  . Chlorhexidine Gluconate Cloth  6 each Topical Daily  . docusate  100 mg Oral BID  . dronabinol  2.5 mg Oral BID AC  . feeding supplement (NEPRO CARB STEADY)  237 mL Oral TID BM  . insulin aspart  0-9 Units Subcutaneous Daily  . levothyroxine  75 mcg Oral Q0600  . mouth rinse  15 mL Mouth Rinse BID  . metoprolol succinate  12.5 mg Oral Daily  . mirtazapine  15 mg Oral QHS  . multivitamin with minerals  1 tablet Oral Daily  . pantoprazole  40 mg Oral Daily  . polyethylene glycol  17 g Oral Daily  . sodium chloride flush  10-40 mL Intracatheter Q12H   Continuous Infusions: . sodium chloride Stopped (03/20/21 1026)       LOS: 39 days    Time spent: 20 minutes    Ezekiel Slocumb, DO [facility]  04/01/2021, 4:22 PM      If 7PM-7AM, please contact night-coverage. How to contact the [facility] Attending or Consulting provider [place] or covering provider during after hours [place], for this  patient?    1. Check the care team in [facility] and look for a) attending/consulting [facility] provider listed and b) the [facility] team listed 2. Log into [email or web address] and use [place]'s universal password to access. If you do not have the password, please contact the hospital operator. 3. Locate the [facility] provider you are looking for under [facility] and page to a number that you can be directly reached. 4. If you still have difficulty reaching the provider, please page the [facility] (Director on Call) for the Hospitalists listed on amion for assistance.

## 2021-04-02 LAB — GLUCOSE, CAPILLARY: Glucose-Capillary: 81 mg/dL (ref 70–99)

## 2021-04-02 NOTE — Progress Notes (Signed)
Physical Therapy Treatment Patient Details Name: Chiniqua Kilcrease MRN: [id number] DOB: 1951/08/14 Today's Date: 04/02/2021    History of Present Illness Zaraya Delauder is a 70 y.o. female with medical history significant for colonic bladder fistula, diverticulitis, hypertension, hypothyroidism, hyperlipidemia, ulcerative colitis, who presented to the hospital on 02/21/2021 because of abdominal pain.  She was admitted to the hospital for acute diverticulitis.  Hospital course was complicated by perforated diverticulitis and she underwent emergent sigmoid colectomy with end colostomy and Hartman's procedure on 03/01/2021.  She was transferred to the ICU postoperatively.  She developed septic shock requiring multiple vasopressors.  She remained intubated on mechanical ventilation postoperatively.  She could not be liberated from the ventilator.  She failed extubation on 03/06/2021 and was reintubated.  ENT was consulted and tracheostomy was performed on 03/08/2021.   Ultimately extubated 4/8.    PT Comments    Pt alert, eager to participate with PT, oriented to self/situation, occasional redirection to task needed. Reported generalized pain including abdominal, did state 9/10, premedicated prior to session. spO2 monitored intermittently as well, >90% on room air. The patient demonstrated good progress towards goals, but still exhibited deficits in balance, endurance/activity tolerance, and impaired function. Supine to sit with minA, pt able to don bilateral socks with minA. Sit <> stand many times, CGA and modA. Pt needed cueing for safety and sequencing with each attempt. She was able to ambulate in a few bouts. At least minA-modA to maintain balance and intermittent assist with RW management. Up in chair, all needs in reach. The patient would benefit from further skilled PT intervention to continue maximize safety, independence, and function. Recommendation remains appropriate.    Follow Up Recommendations   CIR     Equipment Recommendations  Other (comment) (defer to next level of care)    Recommendations for Other Services Rehab consult     Precautions / Restrictions Precautions Precautions: Fall Precaution Comments: wound vac/trach/PMV Restrictions Weight Bearing Restrictions: No    Mobility  Bed Mobility Overal bed mobility: Needs Assistance Bed Mobility: Supine to Sit     Supine to sit: Min assist     General bed mobility comments: assist for trunk elevation, cued for sequencing    Transfers Overall transfer level: Needs assistance Equipment used: Rolling walker (2 wheeled) Transfers: Sit to/from Stand Sit to Stand: Mod assist         General transfer comment: many sit <> stands performed, modA. cues for set up and hand placement for safety ea time  Ambulation/Gait Ambulation/Gait assistance: Min assist;Mod assist Gait Distance (Feet): 14 Feet (1+ 34ft, + 1ft) Assistive device: Rolling walker (2 wheeled)       General Gait Details: ambulated 6ft to chair, additional 73ft and then additional 31ft   Stairs             Wheelchair Mobility    Modified Rankin (Stroke Patients Only)       Balance Overall balance assessment: Needs assistance Sitting-balance support: Feet supported Sitting balance-Leahy Scale: Fair Sitting balance - Comments: pt able to sit EOB with supervision   Standing balance support: Bilateral upper extremity supported;During functional activity Standing balance-Leahy Scale: Poor Standing balance comment: reliant on BUE support on RW and min assist to maintain balance                            Cognition Arousal/Alertness: Awake/alert Behavior During Therapy: WFL for tasks assessed/performed Overall Cognitive Status: No family/caregiver present to determine  baseline cognitive functioning                                 General Comments: Pt oriented to self, situation, behavior WFLs, occasional  redirection to task needed      Exercises      General Comments        Pertinent Vitals/Pain Pain Assessment: Faces Faces Pain Scale: Hurts little more Pain Location: abd with repositioning Pain Descriptors / Indicators: Grimacing Pain Intervention(s): Limited activity within patient's tolerance;Monitored during session;Repositioned;Premedicated before session    Home Living                      Prior Function            PT Goals (current goals can now be found in the care plan section) Progress towards PT goals: Progressing toward goals    Frequency    Min 2X/week      PT Plan Current plan remains appropriate    Co-evaluation              AM-PAC PT "6 Clicks" Mobility   Outcome Measure  Help needed turning from your back to your side while in a flat bed without using bedrails?: A Little Help needed moving from lying on your back to sitting on the side of a flat bed without using bedrails?: A Lot Help needed moving to and from a bed to a chair (including a wheelchair)?: A Lot Help needed standing up from a chair using your arms (e.g., wheelchair or bedside chair)?: A Lot Help needed to walk in hospital room?: A Lot Help needed climbing 3-5 steps with a railing? : A Lot 6 Click Score: 13    End of Session Equipment Utilized During Treatment: Gait belt Activity Tolerance: Patient tolerated treatment well Patient left: in chair;with call bell/phone within reach;with chair alarm set Nurse Communication: Mobility status PT Visit Diagnosis: Muscle weakness (generalized) (M62.81);Unsteadiness on feet (R26.81);Other abnormalities of gait and mobility (R26.89) Pain - part of body:  (abdominal)     Time: 8115-7262 PT Time Calculation (min) (ACUTE ONLY): 27 min  Charges:  $Therapeutic Exercise: 23-37 mins                    Lieutenant Diego PT, DPT 9:52 AM,04/02/21

## 2021-04-02 NOTE — Progress Notes (Signed)
Occupational Therapy Treatment Patient Details Name: Desiree Madden MRN: [id number] DOB: [birth year] Today's Date: 04/02/2021    History of present illness Desiree Madden is a 70 y.o. female with medical history significant for colonic bladder fistula, diverticulitis, hypertension, hypothyroidism, hyperlipidemia, ulcerative colitis, who presented to the hospital on 02/21/2021 because of abdominal pain.  She was admitted to the hospital for acute diverticulitis.  Hospital course was complicated by perforated diverticulitis and she underwent emergent sigmoid colectomy with end colostomy and Hartman's procedure on 03/01/2021.  She was transferred to the ICU postoperatively.  She developed septic shock requiring multiple vasopressors.  She remained intubated on mechanical ventilation postoperatively.  She could not be liberated from the ventilator.  She failed extubation on 03/06/2021 and was reintubated.  ENT was consulted and tracheostomy was performed on 03/08/2021.   Ultimately extubated 4/8.   OT comments  Pt seen for OT treatment on this date. Upon arrival to room, pt seated upright in recliner requesting "to get washed up" with OT. Pt encouraged pt to participate in bathing while standing sink-side, with pt motivated to attempt. Pt demonstrated some anxiety with performing initial sit>stand transfer, requesting assistance from OT before attempting, however able to perform a total of x4 sit<>stand transfers this session with MOD A and verbal cues for safe hand placement with RW. Pt is progressing well and was able to stand for ~41mins at sink (with one seated rest break) while participating in standing UB/LB bathing with MIN-MOD A. Pt was able to perform standing grooming tasks with MIN GUARD. Following standing ADLs, pt able to walk 57ft with RW and MIN A to return to recliner. Pt is making good progress toward goals and continues to benefit from skilled OT services to improve balance, strength, and activity  tolerance, and ultimately maximize return to PLOF. Will continue to follow POC. Discharge recommendation remains appropriate.    Follow Up Recommendations  CIR;Supervision/Assistance - 24 hour    Equipment Recommendations  Other (comment) (defer to next venue of care)       Precautions / Restrictions Precautions Precautions: Fall Precaution Comments: wound vac/trach/PMV Restrictions Weight Bearing Restrictions: No       Mobility Bed Mobility     General bed mobility comments: Not assessed, in recliner upon arrival    Transfers Overall transfer level: Needs assistance Equipment used: Rolling walker (2 wheeled) Transfers: Sit to/from Stand Sit to Stand: Mod assist         General transfer comment: x4 bouts performed. Required MOD A for upward momentum and verbal cues for safe hand placement during each bout    Balance Overall balance assessment: Needs assistance Sitting-balance support: Feet supported;No upper extremity supported Sitting balance-Leahy Scale: Fair Sitting balance - Comments: Pt able to sit EOB during UB/LB dressing with MIN GUARD   Standing balance support: Bilateral upper extremity supported;During functional activity Standing balance-Leahy Scale: Poor Standing balance comment: Requires BUE support from RW/sink to maintain balance during standing ADLs                           ADL either performed or assessed with clinical judgement   ADL Overall ADL's : Needs assistance/impaired     Grooming: Oral care;Brushing hair;Set up;Min guard;Standing Grooming Details (indicate cue type and reason): MIN GUARD for standing balance. Upper Body Bathing: Moderate assistance;Standing Upper Body Bathing Details (indicate cue type and reason): MOD A for washing abdomen/trunk. Pt able to wash UE with MIN GUARD for standing balance  Lower Body Bathing: Minimal assistance;Sit to/from stand Lower Body Bathing Details (indicate cue type and reason): MIN A  to wash back of thighs when standing Upper Body Dressing : Set up;Min guard;Sitting Upper Body Dressing Details (indicate cue type and reason): MIN GUARD to don/doff hospital gown while sitting EOB Lower Body Dressing: Sitting/lateral leans;Moderate assistance Lower Body Dressing Details (indicate cue type and reason): MIN GUARD to don/doff socks while seated EOB. MAX A to don/doff L LE AFO             Functional mobility during ADLs: Minimal assistance;Rolling walker (MIN A to walk ~10 ft with RW, x2 times)                 Cognition Arousal/Alertness: Awake/alert Behavior During Therapy: WFL for tasks assessed/performed Overall Cognitive Status: No family/caregiver present to determine baseline cognitive functioning                                 General Comments: Pt alert and oriented to self and situation. Demonstrates some anxiety with performing sit>stand transfers, requesting assistance from OT before attempting, however overall motivated to participate in standing ADLs. Able to follow 1-step commands consistently. Requires MIN verbal cues to follow 2-step commands                   Pertinent Vitals/ Pain       Pain Assessment: No/denies pain Faces Pain Scale: Hurts little more Pain Location: abd with repositioning Pain Descriptors / Indicators: Grimacing Pain Intervention(s): Limited activity within patient's tolerance;Monitored during session;Repositioned;Premedicated before session         Frequency  Min 3X/week        Progress Toward Goals  OT Goals(current goals can now be found in the care plan section)  Progress towards OT goals: Progressing toward goals  Acute Rehab OT Goals Patient Stated Goal: to get stronger OT Goal Formulation: With patient Time For Goal Achievement: 04/04/21 Potential to Achieve Goals: Good  Plan Discharge plan remains appropriate;Frequency remains appropriate       AM-PAC OT "6 Clicks" Daily Activity      Outcome Measure   Help from another person eating meals?: A Little Help from another person taking care of personal grooming?: A Little Help from another person toileting, which includes using toliet, bedpan, or urinal?: A Little Help from another person bathing (including washing, rinsing, drying)?: A Lot Help from another person to put on and taking off regular upper body clothing?: A Little Help from another person to put on and taking off regular lower body clothing?: A Lot 6 Click Score: 16    End of Session Equipment Utilized During Treatment: Other (comment) (trach)  OT Visit Diagnosis: Unsteadiness on feet (R26.81);Muscle weakness (generalized) (M62.81);Other symptoms and signs involving cognitive function   Activity Tolerance Patient tolerated treatment well   Patient Left in chair;with call bell/phone within reach;with chair alarm set   Nurse Communication Mobility status        Time: 1005-1043 OT Time Calculation (min): 38 min  Charges: OT General Charges $OT Visit: 1 Visit OT Treatments $Self Care/Home Management : 38-52 mins  Fredirick Maudlin, OTR/L [place]

## 2021-04-02 NOTE — TOC Progression Note (Signed)
Transition of Care [facility]) - Progression Note    Patient Details  Name: Ivah Girardot MRN: [id number] Date of Birth: Jan 12, 1951  Transition of Care [facility]) CM/SW Contact  Shelbie Hutching, RN Phone Number: 04/02/2021, 1:40 PM  Clinical Narrative:    Insurance authorized for [employer] after Peer to peer with Dr. Arbutus Ped.  No CIR beds today but maybe tomorrow.    Expected Discharge Plan: IP Rehab Facility Barriers to Discharge: Other (comment)  Expected Discharge Plan and Services Expected Discharge Plan: [place]   Discharge Planning Services: CM Consult Post Acute Care Choice: IP Rehab                   DME Arranged: N/A DME Agency: NA       HH Arranged: NA           Social Determinants of Health (SDOH) Interventions    Readmission Risk Interventions No flowsheet data found.

## 2021-04-02 NOTE — H&P (Incomplete)
Physical Medicine and Rehabilitation Admission H&P    Chief Complaint  Patient presents with  . Abdominal Pain  : HPI: Desiree Madden is a 70 year old right-handed female with history of hypothyroidism, CAD with CABG 2015 hypertension, hyperlipidemia as well as tobacco use, recurrent UTIs, colovesicular fistula  and diverticulitis followed by gastroenterology services Dr Earlie Counts.  Per chart review patient lives with spouse independent prior to admission and active.  1 level home 3 steps to entry.  Presented 02/21/2021 to [facility] with lower abdominal pain of acute onset as well as bouts of vomiting.  CT on admission suspicious for acute diverticulitis based on pericolonic fat stranding seen about the proximal sigmoid colon in the region of multiple colonic diverticula.  Large volume stool burden also seen.  Patient was seen by her primary gastroenterologist Dr.Feiler most recently November 2021 and notes the patient had series of UTIs ultimately found to have cola vascular fistula as a consequence of previous episodes of diverticulitis.  She had been seen by colorectal surgery recommended laparoscopic possible open sigmoid resection with left ureteric stent that was to be completed in October or November but incomplete procedure due to poor prep both times.  Patient underwent placement of image guided drain left lower quadrant/pigtail 02/28/2021 per interventional radiology.  Follow-up x-rays and imaging revealed massive edematous and chronically scarred sigmoid colon extending from mid descending colon with redundancy into the right pelvis and gangrenous changes.  Underwent sigmoid colectomy with end colostomy, Hartman's procedure, splenic flexure mobilization 03/01/2021 per Dr. Sheilah Mins surgery as well as cystorrhaphy same day per Dr. Diamantina Providence of urology services.  Patient did remain intubated noting septic shock requiring multiple vasopressors followed by critical care.  Maintained on broad-spectrum  antibiotics.  Due to prolonged intubation underwent tracheostomy 03/08/2021 per Dr. Clyde Canterbury with a #6 Shiley tube placed.  On 03/09/2021 due to redundant viable access: With resultant colostomy dysfunction underwent colostomy revision 03/09/2021.  A wound VAC remained in place.  Follow-up critical care medicine regards to acute on chronic respiratory failure tolerating room air since 03/27/2021 awaiting plan for decannulation.  Cardiology follow-up for history of CAD CABG developing SVT nonsustained requiring amiodarone as well as Eliquis.  In regards to patient's acute urinary retention status postrepair colovesicular fistula cystogram was completed no evidence of leak.  Foley tube removed but replaced 03/30/2021 due to ongoing retention requiring in and out catheterizations.  Her diet has been advanced to dysphagia #1 nectar thick liquid.  Hospital course anemia 9.0 and monitored.  Patient with routine skin checks foam dressing to buttocks change every 3 days as needed soiling.  Therapy evaluations completed due to patient decreased functional mobility was admitted for a comprehensive rehab program.  Review of Systems  Constitutional: Positive for fever. Negative for chills.  HENT: Negative for hearing loss.   Eyes: Negative for blurred vision, double vision and photophobia.  Respiratory: Negative for cough and shortness of breath.   Cardiovascular: Positive for palpitations and leg swelling. Negative for chest pain.  Gastrointestinal: Positive for abdominal pain, constipation and vomiting.  Genitourinary: Positive for dysuria and urgency. Negative for flank pain and hematuria.  Musculoskeletal: Positive for joint pain and myalgias.  Skin: Negative for rash.  Neurological: Positive for weakness.  All other systems reviewed and are negative.  Past Medical History:  Diagnosis Date  . Hypertension   . Thyroid disease    Past Surgical History:  Procedure Laterality Date  . CARDIAC SURGERY    .  COLECTOMY WITH COLOSTOMY CREATION/HARTMANN  PROCEDURE Left 03/01/2021   Procedure: COLECTOMY WITH COLOSTOMY CREATION/HARTMANN PROCEDURE;  Surgeon: Ronny Bacon, MD;  Location: [facility] ORS;  Service: General;  Laterality: Left;  . COLOSTOMY REVISION N/A 03/09/2021   Procedure: COLOSTOMY REVISION;  Surgeon: Ronny Bacon, MD;  Location: [facility] ORS;  Service: General;  Laterality: N/A;  . TRACHEOSTOMY TUBE PLACEMENT N/A 03/08/2021   Procedure: TRACHEOSTOMY;  Surgeon: Clyde Canterbury, MD;  Location: [facility] ORS;  Service: ENT;  Laterality: N/A;   Family History  Problem Relation Age of Onset  . Arthritis Other   . CAD Mother    Social History:  reports that she has quit smoking. She has never used smokeless tobacco. She reports that she does not drink alcohol and does not use drugs. Allergies:  Allergies  Allergen Reactions  . Baclofen     Other reaction(s): Headache  . Sertraline     Other reaction(s): Other (See Comments), Other (See Comments) Muscle aches Muscle aches   . Sulfa Antibiotics     Other reaction(s): Other (See Comments), Other (See Comments) Pt was told not to take it due to colitis Pt was told not to take it due to colitis   . Ibuprofen Nausea And Vomiting  . Trazodone And Nefazodone    Medications Prior to Admission  Medication Sig Dispense Refill  . alendronate (FOSAMAX) 70 MG tablet Take 70 mg by mouth once a week. Take with a full glass of water on an empty stomach.    Marland Kitchen aspirin EC 81 MG tablet Take 81 mg by mouth daily.    Marland Kitchen atorvastatin (LIPITOR) 20 MG tablet Take 20 mg by mouth daily.    . diclofenac Sodium (VOLTAREN) 1 % GEL Apply topically 4 (four) times daily.    . fluticasone (FLONASE) 50 MCG/ACT nasal spray Place 2 sprays into both nostrils daily.    Marland Kitchen gabapentin (NEURONTIN) 300 MG capsule Take 300 mg by mouth 3 (three) times daily.    Marland Kitchen levothyroxine (SYNTHROID) 75 MCG tablet Take 75 mcg by mouth daily before breakfast.    . meloxicam (MOBIC) 15 MG tablet  Take 15 mg by mouth daily.    . metoprolol succinate (TOPROL-XL) 50 MG 24 hr tablet Take 50 mg by mouth daily.    Marland Kitchen oxyCODONE (OXY IR/ROXICODONE) 5 MG immediate release tablet Take 5 mg by mouth every 12 (twelve) hours as needed.    . traZODone (DESYREL) 100 MG tablet Take 50-100 mg by mouth at bedtime. Take 1/2 tablet by mouth each evening for sleep, can increase to $RemoveBef'100mg'evhqmqcTPl$  whole tablet after 1st week.      Drug Regimen Review Drug regimen was reviewed and remains appropriate with no significant issues identified  Home: Home Living Family/patient expects to be discharged to:: Private residence Living Arrangements: Spouse/significant other (daughter lives in in Museum/gallery curator) Available Help at Discharge: Family,Available 24 hours/day (daughter works remote from home) Type of Home: House Home Access: Stairs to enter Technical brewer of Steps: [place]: Two level,Able to live on main level with bedroom/bathroom Bathroom Shower/Tub: Chiropodist: Standard Bathroom Accessibility: Yes Home Equipment: None  Lives With: Spouse,Daughter   Functional History: Prior Function Level of Independence: Independent Comments: Pt reports that she is active in the community and independent w/o AD.  Functional Status:  Mobility: Bed Mobility Overal bed mobility: Needs Assistance Bed Mobility: Supine to Sit,Sit to Supine Rolling: Min assist,Mod assist Sidelying to sit: Mod assist Supine to sit: Mod assist Sit to supine: Mod assist Sit to sidelying:  Max assist General bed mobility comments: Mod cuing for technique and encouragment. Assistance for L LE and trunk to exit L side of bed Transfers Overall transfer level: Needs assistance Equipment used: Rolling walker (2 wheeled) Transfers: Sit to/from Stand Sit to Stand: Mod assist Stand pivot transfers: Max assist General transfer comment: mod lifting assistance to stand from standard  height Ambulation/Gait Ambulation/Gait assistance: Min assist,Mod assist Gait Distance (Feet): 15 Feet Assistive device: Rolling walker (2 wheeled) Gait Pattern/deviations: Step-to pattern General Gait Details: Pt was able to ambulate 1 x 5 ft then 1 x 15 ft with RW. Gait velocity: decreased Stairs:  (deferred secondary to abdominal discomfort)    ADL: ADL Overall ADL's : Needs assistance/impaired Grooming: Oral care,Minimal assistance Grooming Details (indicate cue type and reason): Min A for standing balance. set up A with min cuing to brush teeth Lower Body Bathing: Moderate assistance Lower Body Bathing Details (indicate cue type and reason): assist to come into figure four position and min guard for balance while leaning forward General ADL Comments: min guard for sitting balance for pt to wash hair while seated on EOB. Pt performs oral care with set up A and min cuing for thoroughness.  Cognition: Cognition Overall Cognitive Status: Impaired/Different from baseline Orientation Level: Oriented X4 Cognition Arousal/Alertness: Awake/alert Behavior During Therapy: Restless,Anxious Overall Cognitive Status: Impaired/Different from baseline Area of Impairment: Orientation,Following commands,Safety/judgement,Problem solving,Awareness,Attention Orientation Level: Disoriented to,Time,Situation Following Commands: Follows one step commands with increased time,Follows one step commands consistently,Follows multi-step commands inconsistently Safety/Judgement: Decreased awareness of safety,Decreased awareness of deficits Awareness: Intellectual Problem Solving: Difficulty sequencing,Requires verbal cues,Requires tactile cues General Comments: Pt is A and O x 2. much more cooperative and motivated Difficult to assess due to: Tracheostomy  Physical Exam: Blood pressure 118/63, pulse 83, temperature 98.2 F (36.8 C), temperature source Oral, resp. rate 16, height 5' 4.02" (1.626 m),  weight 60.4 kg, SpO2 100 %. Physical Exam Abdominal:     Comments: Wound VAC with colostomy in place.  Skin:    Comments: Tracheostomy tube in place  Neurological:     Comments: Patient is alert sitting up in bed.  Makes eye contact with examiner.  Follows simple commands.     Results for orders placed or performed during the hospital encounter of 02/21/21 (from the past 48 hour(s))  Glucose, capillary     Status: None   Collection Time: 03/31/21  8:35 AM  Result Value Ref Range   Glucose-Capillary 85 70 - 99 mg/dL    Comment: Glucose reference range applies only to samples taken after fasting for at least 8 hours.  Basic metabolic panel     Status: Abnormal   Collection Time: 03/31/21  9:04 AM  Result Value Ref Range   Sodium 136 135 - 145 mmol/L   Potassium 3.6 3.5 - 5.1 mmol/L   Chloride 99 98 - 111 mmol/L   CO2 26 22 - 32 mmol/L   Glucose, Bld 91 70 - 99 mg/dL    Comment: Glucose reference range applies only to samples taken after fasting for at least 8 hours.   BUN 28 (H) 8 - 23 mg/dL   Creatinine, Ser 1.03 (H) 0.44 - 1.00 mg/dL   Calcium 8.9 8.9 - 10.3 mg/dL   GFR, Estimated 58 (L) >60 mL/min    Comment: (NOTE) Calculated using the CKD-EPI Creatinine Equation (2021)    Anion gap 11 5 - 15    Comment: Performed at [facility], [address], [place], [place] [address]  Magnesium     Status: None   Collection Time: 03/31/21  9:04 AM  Result Value Ref Range   Magnesium 2.0 1.7 - 2.4 mg/dL    Comment: Performed at [facility], [address] [place], [place] [address]  Glucose, capillary     Status: Abnormal   Collection Time: 03/31/21  4:19 PM  Result Value Ref Range   Glucose-Capillary 121 (H) 70 - 99 mg/dL    Comment: Glucose reference range applies only to samples taken after fasting for at least 8 hours.  CBC     Status: Abnormal   Collection Time: 04/01/21  4:55 AM  Result Value Ref Range   WBC 7.3 4.0 - 10.5 K/uL   RBC 2.95 (L) 3.87 -  5.11 MIL/uL   Hemoglobin 9.0 (L) 12.0 - 15.0 g/dL   HCT 26.9 (L) 36.0 - 46.0 %   MCV 91.2 80.0 - 100.0 fL   MCH 30.5 26.0 - 34.0 pg   MCHC 33.5 30.0 - 36.0 g/dL   RDW 14.9 11.5 - 15.5 %   Platelets 323 150 - 400 K/uL   nRBC 0.0 0.0 - 0.2 %    Comment: Performed at [facility], [address] [place], [place] [address]  Glucose, capillary     Status: None   Collection Time: 04/01/21  4:21 PM  Result Value Ref Range   Glucose-Capillary 74 70 - 99 mg/dL    Comment: Glucose reference range applies only to samples taken after fasting for at least 8 hours.   No results found.     Medical Problem List and Plan: 1.  Debility/septic shock/hemorrhagic shock secondary to diverticulitis with perforation pelvic abscess, colon vascular fistula gangrene of the sigmoid colon.  Status post sigmoid colectomy and end colostomy, Hartmann procedure 03/01/2021 as well as colovesical repair.  Wound VAC in place  -patient may *** shower  -ELOS/Goals: *** 2.  Antithrombotics: -DVT/anticoagulation: Eliquis  -antiplatelet therapy: N/A 3. Pain Management: Oxycodone as needed 4. Mood: Remeron 15 mg nightly  -antipsychotic agents: N/A 5. Neuropsych: This patient is capable of making decisions on her own behalf. 6. Skin/Wound Care: Routine skin checks 7. Fluids/Electrolytes/Nutrition: Routine in and outs with follow-up chemistries 8.  Acute respiratory failure.  Status post tracheostomy 03/08/2021 per Dr. Richardson Landry critical care await plan for decannulation 9.  SVT/nonsustained.  Continue Eliquis as well as amiodarone.  Follow-up per cardiology services Dr. Clayborn Bigness 10.  Acute urinary retention.  Status post colovesicular fistula repair.  Cystogram 03/27/2021 no evidence of leak.  Foley removed but replaced 03/30/2021 due to ongoing retention follow-up per urology services Dr. Diamantina Providence 11.  Chronic systolic and diastolic congestive heart failure.  Echocardiogram with ejection fraction 4158% grade 1  diastolic dysfunction.  Continue Toprol.  Monitor for any signs of fluid overload 12.  Acute blood loss anemia.  Follow-up CBC 13.  Obesity.  BMI 22.85.  Dietary follow-up 14.  Dysphagia.  Dysphagia #1 nectar liquids.  Follow-up speech therapy  ***  Cathlyn Parsons, PA-C 04/02/2021

## 2021-04-02 NOTE — Consult Note (Addendum)
[place] Nurse ostomy follow up Stoma type/location: LMQ colosotmy Stomal assessment/size: 1 and 3/8 inches round, raised, red, above skin level.  Peristomal assessment: Creasing at 9 o'clock and beneath stoma Treatment options for stomal/peristomal skin: skin barrier ring segments used to fill creased and establish a flat plane for pouching Output: brown stool balls, none in pouch Ostomy pouching: 2pc. 2 and 3/4 inch pouching system with aforementioned skin barrier rings Education provided: None. Patient is scattered in conversation, distracted by [facility] change and student observers initially, then disinterested in procedure and looking out room window for entire procedure. Enrolled patient in [place] Start Discharge program: Yes, previously  [place] Nurse wound follow up Wound type:Routine NPWT dressing change. Measurement:Two wounds separated by a thin skin bridge. One staple visible. Proximal wound measures 4.5cm x 2cm x 0.3cm. Distal wound measures 7cm x 3.2cm x 0.4cm  Wound bed: Red, moist. Drainage (amount, consistency, odor) Small serosanguinous Periwound: Some maceration in the periwound areas.  These are covered with skin barrier ring segments prior to application of new NPWT device Dressing procedure/placement/frequency: NPWT dressing removed, 1 piece of black foam.  Wound cleansed with NS, and patted dry. Periwound skin is protected with skin barrier ring segments.  Two pieces of black foam used to obliterate dead space, these touch over skin barrier ring in center.  Sponges are covered with drape.  Drape is attached to 174mmHg continuous negative pressure and an immediate seal is achieved. The dressing is labeled and dated.  The ostomy is then pouched.  [place] nursing team will follow, and will remain available to this patient, the nursing and medical teams.   Supply in room for Wednesday.  Thanks, Maudie Flakes, MSN, RN, [place], Arther Abbott  Pager# [phone]

## 2021-04-02 NOTE — Progress Notes (Signed)
Inpatient Rehabilitation Admissions Coordinator  I have received approval for CIR but no bed today. I spoke with daughter by phone and she is aware. I will follow up with team tomorrow for bed availability.  Danne Baxter, RN, MSN Rehab Admissions Coordinator [phone] 04/02/2021 2:05 PM

## 2021-04-02 NOTE — Progress Notes (Signed)
Inpatient Rehabilitation Admissions Coordinator  I await [facility] Medicare determination for possible CIR admit.Peer to peer pending with Dr. Arbutus Ped.  Danne Baxter, RN, MSN Rehab Admissions Coordinator [phone] 04/02/2021 12:16 PM

## 2021-04-02 NOTE — Care Management Important Message (Signed)
Important Message  Patient Details  Name: Dhanvi Boesen MRN: [id number] Date of Birth: 03-29-1951   Medicare Important Message Given:  Yes     Juliann Pulse A Lizania Bouchard 04/02/2021, 10:35 AM

## 2021-04-02 NOTE — Progress Notes (Signed)
PROGRESS NOTE    Desiree Madden   QJF:[id number]  DOB: November 24, 1951  PCP: [place]    DOA: 02/21/2021 LOS: 32   Brief Narrative   Desiree Madden is a 70 y.o. female with medical history significant for colonic bladder fistula, diverticulitis, hypertension, hypothyroidism, hyperlipidemia, ulcerative colitis, who presented to the hospital on 02/21/2021 because of abdominal pain.  She was admitted with acute diverticulitis.    Hospital course was complicated by perforated diverticulitis and she underwent emergent sigmoid colectomy with end colostomy and Hartman's procedure on 03/01/2021.  She was transferred to the ICU postoperatively, developed septic shock requiring multiple vasopressors.  She remained intubated on mechanical ventilation postoperatively and subsequent difficulty liberating pt from the ventilator, failed extubation on 03/06/2021 and was reintubated.   ENT was consulted and tracheostomy was performed on 03/08/2021.    Assessment & Plan   Principal Problem:   Diverticulitis Active Problems:   Fistula, bladder   Ulcerative colitis ([place])   Hyperlipidemia   Essential hypertension   Hypothyroidism   Abnormal ECG   CKD (chronic kidney disease), stage III (HCC)   Elevated lactic acid level   Protein-calorie malnutrition, severe   Septic shock, hemorrhagic shock secondary to complicated diverticulitis with perforation, pelvic abscess, colo-vesicular fistula, gangrene of the sigmoid colon: Shock physiology resolved, ICU course as outlined above.   S/p sigmoid colectomy with end colostomy, Hartman's procedure on 03/01/2021. Completed antibiotics on 03/15/2021.   --Wound VAC in place --Continue local wound care --Colostomy care --Follow-up with general surgery for further recommendations.  Dysphagia - improving Anorexia - appetite improving Severe malnutrition - tolerating dysphagia 1 diet and nectar thick liquids 4/24-25: PO intake is improving with new  medications  --encourage PO intake  --appreciate dietician and SLP recommendations --started on Marinol, Remeron also for depression and sleep --If PEG tube needed, either GI or radiology consult  Acute on chronic hypoxic respiratory failure with tracheostomy placed - Failure to wean from ventilator requiring tracheostomy.  Patient has been tolerating room air since 4/19.  Uses TC overnight only for humidification. --continue humidification if pt prefers --my colleague, Dr. Mal Misty, discussed with ENT Dr. Richardson Landry re trach removal.  Want to ensure stable respiratory status and not requiring any support prior to removal.  Possible removal early next week (now this week). --Touch base again with ENT for follow up plan post-dc  Hypokalemia / Hypomagnesemia - Replaced K and Mg.  --Monitor and further replacement as needed  Non-anion gap metabolic acidosis - Improved. Mildly elevated creatinine: Encourage adequate oral hydration.  SVT, nonsustained, Sinus tachycardia, paroxysmal atrial fibrillation -  --Continue amiodarone and Eliquis.  NSTEMI, ischemic cardiomyopathy, chronic systolic and diastolic CHF - appears well compensated.  2D echo showed EF estimated at 20 to 25% and grade 1 diastolic dysfunction. --Continue Toprol-XL  Acute urinary retention - Urology repaired colo-vesicular fistula.  Cystogram was done 4/19 and there was no evidence of leak.  Foley removed, but replaced 4/22 due to ongoing retention requiring in/out cath repeatedly. --Urology consulted --Maintain Foley until urology follow up  Tracheostomy in place - follow ENT Dr. Richardson Landry.  Possible removal early next week if respiratory status remains stable.  -- Confirm follow-up while at CIR   Moderately severe depression -multifactorial.  Chart reviewed, PCP note of 01/16/2021: PHQ-9 score was 15 consistent with moderately severe depression.  This now likely complicated by all of above, having trach, prolonged hospital stay.   Patient and daughter agreeable to try on an antidepressant. -- Started on low-dose  of Remeron qHS   (May also help with appetite and sleep) -- Close outpatient follow-up  Generalized weakness -related to her lengthy hospital admission.  Insurance authorization is pending for acute inpatient rehab. -- Continue PT OT while here  Patient BMI: Body mass index is 23.19 kg/m.   DVT prophylaxis: SCDs Start: 02/21/21 2200 apixaban (ELIQUIS) tablet 5 mg   Diet:  Diet Orders (From admission, onward)    Start     Ordered   03/28/21 1433  DIET - DYS 1 Room service appropriate? Yes with Assist; Fluid consistency: Nectar Thick  Diet effective now       Comments: Just a Puree Diet. May have Oatmeal per Speech w/ butter, sugar. Yogurt, pudding, magic cup on trays. NO STRAWS.  Question Answer Comment  Room service appropriate? Yes with Assist   Fluid consistency: Nectar Thick      03/28/21 1433            Code Status: DNR    Subjective 04/02/21    Patient reports feeling well.  Appetite continues to improve.  Mood is better as well.  Says she is having fewer anxiety episodes since she's started being able to move around a bit more.  Sleeping better.  No acute complaints.  Cried tears of joy when I returned to bedside to inform her of insurance approval.   Disposition Plan & Communication   Status is: Inpatient  Remains inpatient appropriate because: Patient requires acute rehab, insurance approved today, awaiting CIR bed.    Dispo: The patient is from: Home              Anticipated d/c is to: CIR              Patient currently is medically stable for discharge   Difficult to place patient No  [employer] peer-to-peer for CIR today - approved.   Family Communication: [name] was at bedside on rounds 4/23.  Will attempt to call.   Consults, Procedures, Significant Events   Consultants:   General surgery  Urology  ENT  PCCM  Procedures:   Tracheostomy  03/08/2021  Sigmoid colectomy with end colostomy, Hartman's procedure 03/01/2021  Cystorrhaphy by urology on 3/24 for repair of colovesicular fistula  Antimicrobials:  Anti-infectives (From admission, onward)   Start     Dose/Rate Route Frequency Ordered Stop   03/27/21 2200  amoxicillin (AMOXIL) capsule 500 mg        500 mg Oral Every 8 hours 03/27/21 1401 03/29/21 1727   03/13/21 1400  meropenem (MERREM) 1 g in sodium chloride 0.9 % 100 mL IVPB        1 g 200 mL/hr over 30 Minutes Intravenous Every 8 hours 03/13/21 0855 03/16/21 2203   03/12/21 1800  meropenem (MERREM) 1 g in sodium chloride 0.9 % 100 mL IVPB  Status:  Discontinued        1 g 200 mL/hr over 30 Minutes Intravenous Every 12 hours 03/12/21 0840 03/13/21 0855   03/10/21 1000  meropenem (MERREM) 1 g in sodium chloride 0.9 % 100 mL IVPB  Status:  Discontinued        1 g 200 mL/hr over 30 Minutes Intravenous Every 8 hours 03/10/21 0854 03/12/21 0840   03/09/21 0600  fluconazole (DIFLUCAN) IVPB 200 mg  Status:  Discontinued        200 mg 100 mL/hr over 60 Minutes Intravenous Every 24 hours 03/08/21 0751 03/14/21 1014   03/08/21 2200  meropenem (MERREM) 1 g  in sodium chloride 0.9 % 100 mL IVPB  Status:  Discontinued        1 g 200 mL/hr over 30 Minutes Intravenous Every 12 hours 03/08/21 0748 03/10/21 0854   03/08/21 0600  fluconazole (DIFLUCAN) IVPB 400 mg  Status:  Discontinued        400 mg 100 mL/hr over 120 Minutes Intravenous Every 24 hours 03/07/21 1321 03/08/21 0839   03/07/21 1100  meropenem (MERREM) 1 g in sodium chloride 0.9 % 100 mL IVPB  Status:  Discontinued        1 g 200 mL/hr over 30 Minutes Intravenous Every 8 hours 03/07/21 1013 03/08/21 0748   03/07/21 1000  meropenem (MERREM) 1 g in sodium chloride 0.9 % 100 mL IVPB  Status:  Discontinued        1 g 200 mL/hr over 30 Minutes Intravenous Every 12 hours 03/06/21 2243 03/07/21 1013   03/07/21 0600  fluconazole (DIFLUCAN) IVPB 400 mg        400 mg 100  mL/hr over 120 Minutes Intravenous  Once 03/06/21 2243 03/07/21 0701   03/04/21 1800  vancomycin (VANCOREADY) IVPB 750 mg/150 mL        750 mg 150 mL/hr over 60 Minutes Intravenous Every 24 hours 03/03/21 1442 03/06/21 2040   03/03/21 0800  vancomycin (VANCOREADY) IVPB 1500 mg/300 mL  Status:  Discontinued        1,500 mg 150 mL/hr over 120 Minutes Intravenous Every 48 hours 03/02/21 1410 03/03/21 1442   03/02/21 0800  vancomycin (VANCOREADY) IVPB 750 mg/150 mL  Status:  Discontinued        750 mg 150 mL/hr over 60 Minutes Intravenous Every 24 hours 03/01/21 1954 03/02/21 1409   03/01/21 2200  ceFEPIme (MAXIPIME) 2 g in sodium chloride 0.9 % 100 mL IVPB        2 g 200 mL/hr over 30 Minutes Intravenous Every 12 hours 03/01/21 1903 03/06/21 2203   03/01/21 2100  vancomycin (VANCOREADY) IVPB 1250 mg/250 mL        1,250 mg 166.7 mL/hr over 90 Minutes Intravenous  Once 03/01/21 1954 03/02/21 0008   03/01/21 2000  metroNIDAZOLE (FLAGYL) IVPB 500 mg        500 mg 100 mL/hr over 60 Minutes Intravenous Every 8 hours 03/01/21 1903 03/06/21 2002   02/28/21 1800  piperacillin-tazobactam (ZOSYN) IVPB 3.375 g  Status:  Discontinued        3.375 g 12.5 mL/hr over 240 Minutes Intravenous Every 8 hours 02/28/21 1643 03/01/21 1903   02/28/21 1730  piperacillin-tazobactam (ZOSYN) IVPB 3.375 g  Status:  Discontinued        3.375 g 100 mL/hr over 30 Minutes Intravenous Every 8 hours 02/28/21 1635 02/28/21 1642   02/28/21 1300  Ampicillin-Sulbactam (UNASYN) 3 g in sodium chloride 0.9 % 100 mL IVPB  Status:  Discontinued        3 g 200 mL/hr over 30 Minutes Intravenous Every 6 hours 02/28/21 1156 02/28/21 1635   02/23/21 1230  piperacillin-tazobactam (ZOSYN) IVPB 3.375 g  Status:  Discontinued        3.375 g 12.5 mL/hr over 240 Minutes Intravenous Every 8 hours 02/23/21 1143 02/28/21 1155   02/22/21 0600  ciprofloxacin (CIPRO) IVPB 400 mg  Status:  Discontinued        400 mg 200 mL/hr over 60 Minutes  Intravenous Every 12 hours 02/21/21 2028 02/23/21 1143   02/22/21 0400  metroNIDAZOLE (FLAGYL) IVPB 500 mg  Status:  Discontinued  500 mg 100 mL/hr over 60 Minutes Intravenous Every 8 hours 02/21/21 2028 02/23/21 1143   02/21/21 1830  ciprofloxacin (CIPRO) IVPB 400 mg        400 mg 200 mL/hr over 60 Minutes Intravenous  Once 02/21/21 1820 02/21/21 1939   02/21/21 1830  metroNIDAZOLE (FLAGYL) IVPB 500 mg        500 mg 100 mL/hr over 60 Minutes Intravenous  Once 02/21/21 1820 02/21/21 2110        Micro    Objective   Vitals:   04/02/21 0900 04/02/21 1208 04/02/21 1520 04/02/21 1640  BP:  120/67  103/62  Pulse:  78  73  Resp:  16  16  Temp:  99.1 F (37.3 C)  97.6 F (36.4 C)  TempSrc:  Oral  Oral  SpO2: 98% 99% 98% 99%  Weight:      Height:        Intake/Output Summary (Last 24 hours) at 04/02/2021 1905 Last data filed at 04/02/2021 1826 Gross per 24 hour  Intake 300 ml  Output 1700 ml  Net -1400 ml   Filed Weights   03/31/21 0513 04/01/21 0505 04/02/21 0500  Weight: 58.8 kg 60.4 kg 61.3 kg    Physical Exam:  General exam: awake, alert, no acute distress, frail HEENT: Trach in place with PMV in place, hearing grossly normal Respiratory system: normal respiratory effort, on room air Cardiovascular system: RRR, no pedal edema.   Gastrointestinal system: Wound Vac in place to midline incision appears well-sealed, left side colostomy bag in place with small piece of solid-appearing brown stool Central nervous system: A&O x4. normal speech Psychiatry: normal/improved mood, congruent affect, judgement and insight appear normal  Labs   Data Reviewed: I have personally reviewed following labs and imaging studies  CBC: Recent Labs  Lab 03/29/21 0525 04/01/21 0455  WBC 6.9 7.3  HGB 9.0* 9.0*  HCT 27.3* 26.9*  MCV 94.1 91.2  PLT 367 009   Basic Metabolic Panel: Recent Labs  Lab 03/27/21 0522 03/28/21 0537 03/29/21 0525 03/30/21 0503 03/31/21 0904   NA 138 138 137 137 136  K 4.2 3.8 3.5 3.8 3.6  CL 105 105 103 103 99  CO2 _0 GLUCOSE 104* 109* 95 89 91  BUN 37* 35* 37* 32* 28*  CREATININE 1.05* 0.99 1.00 0.97 1.03*  CALCIUM 8.9 9.1 9.0 9.0 8.9  MG 2.2 2.0 2.0 1.8 2.0  PHOS 4.2  --  4.6  --   --    GFR: Estimated Creatinine Clearance: 43.9 mL/min (A) (by C-G formula based on SCr of 1.03 mg/dL (H)). Liver Function Tests: No results for input(s): AST, ALT, ALKPHOS, BILITOT, PROT, ALBUMIN in the last 168 hours. No results for input(s): LIPASE, AMYLASE in the last 168 hours. No results for input(s): AMMONIA in the last 168 hours. Coagulation Profile: No results for input(s): INR, PROTIME in the last 168 hours. Cardiac Enzymes: No results for input(s): CKTOTAL, CKMB, CKMBINDEX, TROPONINI in the last 168 hours. BNP (last 3 results) No results for input(s): PROBNP in the last 8760 hours. HbA1C: No results for input(s): HGBA1C in the last 72 hours. CBG: Recent Labs  Lab 03/31/21 0039 03/31/21 0835 03/31/21 1619 04/01/21 1621 04/02/21 1715  GLUCAP 95 85 121* 74 81   Lipid Profile: No results for input(s): CHOL, HDL, LDLCALC, TRIG, CHOLHDL, LDLDIRECT in the last 72 hours. Thyroid Function Tests: No results for input(s): TSH, T4TOTAL, FREET4, T3FREE, THYROIDAB in the last 72  hours. Anemia Panel: No results for input(s): VITAMINB12, FOLATE, FERRITIN, TIBC, IRON, RETICCTPCT in the last 72 hours. Sepsis Labs: No results for input(s): PROCALCITON, LATICACIDVEN in the last 168 hours.  Recent Results (from the past 240 hour(s))  Resp Panel by RT-PCR (Flu A&B, Covid) Nasopharyngeal Swab     Status: None   Collection Time: 03/26/21 10:29 AM   Specimen: Nasopharyngeal Swab; Nasopharyngeal(NP) swabs in vial transport medium  Result Value Ref Range Status   SARS Coronavirus 2 by RT PCR NEGATIVE NEGATIVE Final    Comment: (NOTE) SARS-CoV-2 target nucleic acids are NOT DETECTED.  The SARS-CoV-2 RNA is generally  detectable in upper respiratory specimens during the acute phase of infection. The lowest concentration of SARS-CoV-2 viral copies this assay can detect is 138 copies/mL. A negative result does not preclude SARS-Cov-2 infection and should not be used as the sole basis for treatment or other patient management decisions. A negative result may occur with  improper specimen collection/handling, submission of specimen other than nasopharyngeal swab, presence of viral mutation(s) within the areas targeted by this assay, and inadequate number of viral copies(<138 copies/mL). A negative result must be combined with clinical observations, patient history, and epidemiological information. The expected result is Negative.  Fact Sheet for Patients:  EntrepreneurPulse.com.au  Fact Sheet for Healthcare Providers:  IncredibleEmployment.be  This test is no t yet approved or cleared by the [place] FDA and  has been authorized for detection and/or diagnosis of SARS-CoV-2 by FDA under an Emergency Use Authorization (EUA). This EUA will remain  in effect (meaning this test can be used) for the duration of the COVID-19 declaration under Section 564(b)(1) of the Act, 21 U.S.C.section 360bbb-3(b)(1), unless the authorization is terminated  or revoked sooner.       Influenza A by PCR NEGATIVE NEGATIVE Final   Influenza B by PCR NEGATIVE NEGATIVE Final    Comment: (NOTE) The Xpert Xpress SARS-CoV-2/FLU/RSV plus assay is intended as an aid in the diagnosis of influenza from Nasopharyngeal swab specimens and should not be used as a sole basis for treatment. Nasal washings and aspirates are unacceptable for Xpert Xpress SARS-CoV-2/FLU/RSV testing.  Fact Sheet for Patients: EntrepreneurPulse.com.au  Fact Sheet for Healthcare Providers: IncredibleEmployment.be  This test is not yet approved or cleared by the [place] FDA  and has been authorized for detection and/or diagnosis of SARS-CoV-2 by FDA under an Emergency Use Authorization (EUA). This EUA will remain in effect (meaning this test can be used) for the duration of the COVID-19 declaration under Section 564(b)(1) of the Act, 21 U.S.C. section 360bbb-3(b)(1), unless the authorization is terminated or revoked.  Performed at [facility], [address] [place], [place] [address]       Imaging Studies   No results found.   Medications   Scheduled Meds: . amiodarone  200 mg Oral BID  . apixaban  5 mg Oral BID  . vitamin C  500 mg Oral BID  . chlorhexidine gluconate (MEDLINE KIT)  15 mL Mouth Rinse BID  . Chlorhexidine Gluconate Cloth  6 each Topical Daily  . docusate  100 mg Oral BID  . dronabinol  2.5 mg Oral BID AC  . feeding supplement (NEPRO CARB STEADY)  237 mL Oral TID BM  . insulin aspart  0-9 Units Subcutaneous Daily  . levothyroxine  75 mcg Oral Q0600  . mouth rinse  15 mL Mouth Rinse BID  . metoprolol succinate  12.5 mg Oral Daily  . mirtazapine  15  mg Oral QHS  . multivitamin with minerals  1 tablet Oral Daily  . pantoprazole  40 mg Oral Daily  . polyethylene glycol  17 g Oral Daily  . sodium chloride flush  10-40 mL Intracatheter Q12H   Continuous Infusions: . sodium chloride Stopped (03/20/21 1026)       LOS: 40 days    Time spent: 30 minutes     Ezekiel Slocumb, DO [facility]  04/02/2021, 7:05 PM      If 7PM-7AM, please contact night-coverage. How to contact the [facility] Attending or Consulting provider [place] or covering provider during after hours [place], for this patient?    1. Check the care team in [facility] and look for a) attending/consulting TRH provider listed and b) the [facility] team listed 2. Log into [email or web address] and use [place]'s universal password to access. If you do not have the password, please contact the hospital operator. 3. Locate the [facility] provider you are looking for under  [facility] and page to a number that you can be directly reached. 4. If you still have difficulty reaching the provider, please page the [facility] (Director on Call) for the Hospitalists listed on amion for assistance.

## 2021-04-03 ENCOUNTER — Other Ambulatory Visit (HOSPITAL_COMMUNITY): Payer: Self-pay

## 2021-04-03 ENCOUNTER — Other Ambulatory Visit: Payer: Self-pay

## 2021-04-03 ENCOUNTER — Inpatient Hospital Stay (HOSPITAL_COMMUNITY)
Admission: RE | Admit: 2021-04-03 | Discharge: 2021-04-16 | DRG: 945 | Disposition: A | Discharge status: Home Health | Payer: Medicare HMO | Source: Intra-hospital | Attending: Physical Medicine & Rehabilitation | Admitting: Physical Medicine & Rehabilitation

## 2021-04-03 DIAGNOSIS — I130 Hypertensive heart and chronic kidney disease with heart failure and stage 1 through stage 4 chronic kidney disease, or unspecified chronic kidney disease: Secondary | ICD-10-CM | POA: Diagnosis present

## 2021-04-03 DIAGNOSIS — I480 Paroxysmal atrial fibrillation: Secondary | ICD-10-CM | POA: Diagnosis not present

## 2021-04-03 DIAGNOSIS — F4321 Adjustment disorder with depressed mood: Secondary | ICD-10-CM | POA: Diagnosis present

## 2021-04-03 DIAGNOSIS — F411 Generalized anxiety disorder: Secondary | ICD-10-CM

## 2021-04-03 DIAGNOSIS — N1831 Chronic kidney disease, stage 3a: Secondary | ICD-10-CM | POA: Diagnosis not present

## 2021-04-03 DIAGNOSIS — Z8249 Family history of ischemic heart disease and other diseases of the circulatory system: Secondary | ICD-10-CM

## 2021-04-03 DIAGNOSIS — E039 Hypothyroidism, unspecified: Secondary | ICD-10-CM | POA: Diagnosis present

## 2021-04-03 DIAGNOSIS — K089 Disorder of teeth and supporting structures, unspecified: Secondary | ICD-10-CM

## 2021-04-03 DIAGNOSIS — M4854XA Collapsed vertebra, not elsewhere classified, thoracic region, initial encounter for fracture: Secondary | ICD-10-CM | POA: Diagnosis present

## 2021-04-03 DIAGNOSIS — Z6821 Body mass index (BMI) 21.0-21.9, adult: Secondary | ICD-10-CM

## 2021-04-03 DIAGNOSIS — M21372 Foot drop, left foot: Secondary | ICD-10-CM | POA: Diagnosis present

## 2021-04-03 DIAGNOSIS — K5190 Ulcerative colitis, unspecified, without complications: Secondary | ICD-10-CM | POA: Diagnosis not present

## 2021-04-03 DIAGNOSIS — Z933 Colostomy status: Secondary | ICD-10-CM

## 2021-04-03 DIAGNOSIS — I255 Ischemic cardiomyopathy: Secondary | ICD-10-CM | POA: Diagnosis present

## 2021-04-03 DIAGNOSIS — Z87891 Personal history of nicotine dependence: Secondary | ICD-10-CM

## 2021-04-03 DIAGNOSIS — R1310 Dysphagia, unspecified: Secondary | ICD-10-CM | POA: Diagnosis not present

## 2021-04-03 DIAGNOSIS — E785 Hyperlipidemia, unspecified: Secondary | ICD-10-CM | POA: Diagnosis present

## 2021-04-03 DIAGNOSIS — Z9049 Acquired absence of other specified parts of digestive tract: Secondary | ICD-10-CM

## 2021-04-03 DIAGNOSIS — Z886 Allergy status to analgesic agent status: Secondary | ICD-10-CM

## 2021-04-03 DIAGNOSIS — E440 Moderate protein-calorie malnutrition: Secondary | ICD-10-CM | POA: Diagnosis not present

## 2021-04-03 DIAGNOSIS — G7281 Critical illness myopathy: Secondary | ICD-10-CM | POA: Diagnosis present

## 2021-04-03 DIAGNOSIS — R4189 Other symptoms and signs involving cognitive functions and awareness: Secondary | ICD-10-CM | POA: Diagnosis present

## 2021-04-03 DIAGNOSIS — E876 Hypokalemia: Secondary | ICD-10-CM | POA: Diagnosis not present

## 2021-04-03 DIAGNOSIS — N183 Chronic kidney disease, stage 3 unspecified: Secondary | ICD-10-CM | POA: Diagnosis present

## 2021-04-03 DIAGNOSIS — K0889 Other specified disorders of teeth and supporting structures: Secondary | ICD-10-CM

## 2021-04-03 DIAGNOSIS — F419 Anxiety disorder, unspecified: Secondary | ICD-10-CM | POA: Diagnosis present

## 2021-04-03 DIAGNOSIS — Z930 Tracheostomy status: Secondary | ICD-10-CM | POA: Diagnosis not present

## 2021-04-03 DIAGNOSIS — D62 Acute posthemorrhagic anemia: Secondary | ICD-10-CM

## 2021-04-03 DIAGNOSIS — R1312 Dysphagia, oropharyngeal phase: Secondary | ICD-10-CM | POA: Diagnosis not present

## 2021-04-03 DIAGNOSIS — I214 Non-ST elevation (NSTEMI) myocardial infarction: Secondary | ICD-10-CM | POA: Diagnosis not present

## 2021-04-03 DIAGNOSIS — Z430 Encounter for attention to tracheostomy: Secondary | ICD-10-CM | POA: Diagnosis not present

## 2021-04-03 DIAGNOSIS — Z882 Allergy status to sulfonamides status: Secondary | ICD-10-CM

## 2021-04-03 DIAGNOSIS — Z79890 Hormone replacement therapy (postmenopausal): Secondary | ICD-10-CM

## 2021-04-03 DIAGNOSIS — I5042 Chronic combined systolic (congestive) and diastolic (congestive) heart failure: Secondary | ICD-10-CM

## 2021-04-03 DIAGNOSIS — K5792 Diverticulitis of intestine, part unspecified, without perforation or abscess without bleeding: Secondary | ICD-10-CM | POA: Diagnosis not present

## 2021-04-03 DIAGNOSIS — R339 Retention of urine, unspecified: Secondary | ICD-10-CM

## 2021-04-03 DIAGNOSIS — Z881 Allergy status to other antibiotic agents status: Secondary | ICD-10-CM

## 2021-04-03 DIAGNOSIS — F5104 Psychophysiologic insomnia: Secondary | ICD-10-CM | POA: Diagnosis present

## 2021-04-03 DIAGNOSIS — Z794 Long term (current) use of insulin: Secondary | ICD-10-CM

## 2021-04-03 DIAGNOSIS — I10 Essential (primary) hypertension: Secondary | ICD-10-CM | POA: Diagnosis not present

## 2021-04-03 DIAGNOSIS — R5381 Other malaise: Secondary | ICD-10-CM | POA: Diagnosis not present

## 2021-04-03 DIAGNOSIS — Z885 Allergy status to narcotic agent status: Secondary | ICD-10-CM

## 2021-04-03 DIAGNOSIS — N322 Vesical fistula, not elsewhere classified: Secondary | ICD-10-CM | POA: Diagnosis not present

## 2021-04-03 DIAGNOSIS — Z79899 Other long term (current) drug therapy: Secondary | ICD-10-CM

## 2021-04-03 DIAGNOSIS — E43 Unspecified severe protein-calorie malnutrition: Secondary | ICD-10-CM | POA: Diagnosis not present

## 2021-04-03 DIAGNOSIS — S025XXA Fracture of tooth (traumatic), initial encounter for closed fracture: Secondary | ICD-10-CM | POA: Diagnosis not present

## 2021-04-03 DIAGNOSIS — Z7901 Long term (current) use of anticoagulants: Secondary | ICD-10-CM

## 2021-04-03 DIAGNOSIS — Z7983 Long term (current) use of bisphosphonates: Secondary | ICD-10-CM

## 2021-04-03 LAB — BASIC METABOLIC PANEL
Anion gap: 11 (ref 5–15)
BUN: 32 mg/dL — ABNORMAL HIGH (ref 8–23)
CO2: 27 mmol/L (ref 22–32)
Calcium: 8.8 mg/dL — ABNORMAL LOW (ref 8.9–10.3)
Chloride: 100 mmol/L (ref 98–111)
Creatinine, Ser: 0.92 mg/dL (ref 0.44–1.00)
GFR, Estimated: >60 mL/min (ref >60)
Glucose, Bld: 88 mg/dL (ref 70–99)
Potassium: 3.4 mmol/L — ABNORMAL LOW (ref 3.5–5.1)
Sodium: 138 mmol/L (ref 135–145)

## 2021-04-03 LAB — GLUCOSE, CAPILLARY
Glucose-Capillary: 80 mg/dL (ref 70–99)
Glucose-Capillary: 152 mg/dL — ABNORMAL HIGH (ref 70–99)

## 2021-04-03 MED ORDER — NEPRO/CARBSTEADY PO LIQD: 237.0000 mL | Freq: Three times a day (TID) | ORAL | 0 refills | Status: DC

## 2021-04-03 MED ORDER — INSULIN ASPART 100 UNIT/ML ~~LOC~~ SOLN
0.0000 [IU] | Freq: Every day | SUBCUTANEOUS | Status: DC
  Administered 2021-04-03: 2 [IU] via SUBCUTANEOUS
  Administered 2021-04-04: 1 [IU] via SUBCUTANEOUS

## 2021-04-03 MED ORDER — GERHARDT'S BUTT CREAM
TOPICAL_CREAM | Freq: Three times a day (TID) | CUTANEOUS | Status: DC
  Administered 2021-04-15: 1 via TOPICAL
  Filled 2021-04-03: qty 1

## 2021-04-03 MED ORDER — ASCORBIC ACID 500 MG PO TABS
500.0000 mg | ORAL_TABLET | Freq: Two times a day (BID) | ORAL | Status: DC
  Administered 2021-04-03 – 2021-04-16 (×25): 500 mg via ORAL
  Filled 2021-04-03 (×29): qty 1

## 2021-04-03 MED ORDER — DRONABINOL 2.5 MG PO CAPS
2.5000 mg | ORAL_CAPSULE | Freq: Two times a day (BID) | ORAL | Status: DC
  Administered 2021-04-03 – 2021-04-15 (×25): 2.5 mg via ORAL
  Filled 2021-04-03 (×25): qty 1

## 2021-04-03 MED ORDER — APIXABAN 5 MG PO TABS
5.0000 mg | ORAL_TABLET | Freq: Two times a day (BID) | ORAL | Status: DC
  Administered 2021-04-03 – 2021-04-16 (×26): 5 mg via ORAL
  Filled 2021-04-03 (×26): qty 1

## 2021-04-03 MED ORDER — AMIODARONE HCL 200 MG PO TABS
200.0000 mg | ORAL_TABLET | Freq: Two times a day (BID) | ORAL | Status: DC
  Administered 2021-04-03 – 2021-04-16 (×26): 200 mg via ORAL
  Filled 2021-04-03 (×26): qty 1

## 2021-04-03 MED ORDER — ONDANSETRON HCL 4 MG PO TABS: 4.0000 mg | ORAL_TABLET | Freq: Four times a day (QID) | ORAL | Status: DC | PRN

## 2021-04-03 MED ORDER — RESOURCE THICKENUP CLEAR PO POWD
ORAL | Status: DC | PRN
  Filled 2021-04-03: qty 125

## 2021-04-03 MED ORDER — OXYCODONE HCL 5 MG PO TABS: 5.0000 mg | ORAL_TABLET | Freq: Four times a day (QID) | ORAL | 0 refills | Status: DC | PRN

## 2021-04-03 MED ORDER — GUAIFENESIN-DM 100-10 MG/5ML PO SYRP
5.0000 mL | ORAL_SOLUTION | Freq: Four times a day (QID) | ORAL | Status: DC | PRN
  Filled 2021-04-03: qty 10

## 2021-04-03 MED ORDER — ADULT MULTIVITAMIN W/MINERALS CH
1.0000 | ORAL_TABLET | Freq: Every day | ORAL | Status: DC
  Administered 2021-04-04 – 2021-04-16 (×13): 1 via ORAL
  Filled 2021-04-03 (×13): qty 1

## 2021-04-03 MED ORDER — ONDANSETRON HCL 4 MG PO TABS: 4.0000 mg | ORAL_TABLET | Freq: Four times a day (QID) | ORAL | 0 refills | Status: DC | PRN

## 2021-04-03 MED ORDER — POLYETHYLENE GLYCOL 3350 17 G PO PACK: 17.0000 g | PACK | Freq: Every day | ORAL | 0 refills | Status: DC

## 2021-04-03 MED ORDER — AMIODARONE HCL 200 MG PO TABS: 200.0000 mg | ORAL_TABLET | Freq: Two times a day (BID) | ORAL | Status: DC

## 2021-04-03 MED ORDER — APIXABAN 5 MG PO TABS: 5.0000 mg | ORAL_TABLET | Freq: Two times a day (BID) | ORAL | Status: DC

## 2021-04-03 MED ORDER — METOPROLOL SUCCINATE ER 25 MG PO TB24
12.5000 mg | ORAL_TABLET | Freq: Every day | ORAL | Status: DC
  Administered 2021-04-04 – 2021-04-16 (×13): 12.5 mg via ORAL
  Filled 2021-04-03 (×13): qty 1

## 2021-04-03 MED ORDER — ORAL CARE MOUTH RINSE
15.0000 mL | Freq: Two times a day (BID) | OROMUCOSAL | Status: DC
  Administered 2021-04-03 – 2021-04-10 (×13): 15 mL via OROMUCOSAL

## 2021-04-03 MED ORDER — LEVOTHYROXINE SODIUM 75 MCG PO TABS: 75.0000 ug | ORAL_TABLET | Freq: Every day | ORAL | Status: DC

## 2021-04-03 MED ORDER — ALPRAZOLAM 0.25 MG PO TABS: 0.2500 mg | ORAL_TABLET | Freq: Two times a day (BID) | ORAL | 0 refills | Status: DC | PRN

## 2021-04-03 MED ORDER — DOCUSATE SODIUM 50 MG/5ML PO LIQD: 100.0000 mg | Freq: Two times a day (BID) | ORAL | 0 refills | Status: DC

## 2021-04-03 MED ORDER — ONDANSETRON HCL 4 MG/2ML IJ SOLN
4.0000 mg | Freq: Four times a day (QID) | INTRAMUSCULAR | Status: DC | PRN
Start: 2021-04-03 — End: 2021-04-16

## 2021-04-03 MED ORDER — ASCORBIC ACID 500 MG PO TABS: 500.0000 mg | ORAL_TABLET | Freq: Two times a day (BID) | ORAL | Status: DC

## 2021-04-03 MED ORDER — STARCH (THICKENING) PO POWD
ORAL | Status: DC | PRN
  Filled 2021-04-03: qty 227

## 2021-04-03 MED ORDER — ORAL CARE MOUTH RINSE: 15.0000 mL | Freq: Two times a day (BID) | OROMUCOSAL | 0 refills | Status: DC

## 2021-04-03 MED ORDER — CHLORHEXIDINE GLUCONATE 0.12% ORAL RINSE (MEDLINE KIT): 15.0000 mL | Freq: Two times a day (BID) | OROMUCOSAL | 0 refills | Status: DC

## 2021-04-03 MED ORDER — TAMSULOSIN HCL 0.4 MG PO CAPS
0.4000 mg | ORAL_CAPSULE | Freq: Every day | ORAL | Status: DC
  Administered 2021-04-03 – 2021-04-15 (×13): 0.4 mg via ORAL
  Filled 2021-04-03 (×13): qty 1

## 2021-04-03 MED ORDER — METOPROLOL SUCCINATE ER 25 MG PO TB24: 12.5000 mg | ORAL_TABLET | Freq: Every day | ORAL | Status: DC

## 2021-04-03 MED ORDER — TRAZODONE HCL 50 MG PO TABS: 25.0000 mg | ORAL_TABLET | Freq: Every evening | ORAL | Status: DC | PRN

## 2021-04-03 MED ORDER — OXYCODONE HCL 5 MG PO TABS
5.0000 mg | ORAL_TABLET | Freq: Four times a day (QID) | ORAL | Status: DC | PRN
  Administered 2021-04-04 – 2021-04-06 (×2): 5 mg via ORAL
  Filled 2021-04-03 (×2): qty 1

## 2021-04-03 MED ORDER — PANTOPRAZOLE SODIUM 40 MG PO TBEC: 40.0000 mg | DELAYED_RELEASE_TABLET | Freq: Every day | ORAL | Status: DC

## 2021-04-03 MED ORDER — IPRATROPIUM-ALBUTEROL 0.5-2.5 (3) MG/3ML IN SOLN
3.0000 mL | RESPIRATORY_TRACT | Status: DC | PRN
Start: 2021-04-03 — End: 2021-04-16

## 2021-04-03 MED ORDER — SENNOSIDES 8.8 MG/5ML PO SYRP
10.0000 mL | ORAL_SOLUTION | Freq: Every day | ORAL | Status: DC
  Administered 2021-04-04 – 2021-04-12 (×9): 10 mL via ORAL
  Filled 2021-04-03 (×12): qty 10

## 2021-04-03 MED ORDER — CHLORHEXIDINE GLUCONATE 0.12% ORAL RINSE (MEDLINE KIT)
15.0000 mL | Freq: Two times a day (BID) | OROMUCOSAL | Status: DC
  Administered 2021-04-03 – 2021-04-10 (×12): 15 mL via OROMUCOSAL

## 2021-04-03 MED ORDER — DIPHENHYDRAMINE HCL 12.5 MG/5ML PO ELIX: 12.5000 mg | ORAL_SOLUTION | Freq: Four times a day (QID) | ORAL | Status: DC | PRN

## 2021-04-03 MED ORDER — BISACODYL 10 MG RE SUPP: 10.0000 mg | Freq: Every day | RECTAL | Status: DC | PRN

## 2021-04-03 MED ORDER — PANTOPRAZOLE SODIUM 40 MG PO TBEC
40.0000 mg | DELAYED_RELEASE_TABLET | Freq: Every day | ORAL | Status: DC
  Administered 2021-04-04 – 2021-04-16 (×13): 40 mg via ORAL
  Filled 2021-04-03 (×13): qty 1

## 2021-04-03 MED ORDER — POTASSIUM CHLORIDE CRYS ER 20 MEQ PO TBCR
20.0000 meq | EXTENDED_RELEASE_TABLET | Freq: Two times a day (BID) | ORAL | Status: AC
  Administered 2021-04-03 – 2021-04-05 (×4): 20 meq via ORAL
  Filled 2021-04-03 (×4): qty 1

## 2021-04-03 MED ORDER — ONDANSETRON HCL 4 MG/2ML IJ SOLN: 4.0000 mg | Freq: Four times a day (QID) | INTRAMUSCULAR | Status: DC | PRN

## 2021-04-03 MED ORDER — LIDOCAINE HCL URETHRAL/MUCOSAL 2 % EX GEL
1.0000 "application " | CUTANEOUS | Status: DC | PRN
  Filled 2021-04-03: qty 11

## 2021-04-03 MED ORDER — ALUM & MAG HYDROXIDE-SIMETH 200-200-20 MG/5ML PO SUSP: 30.0000 mL | ORAL | Status: DC | PRN

## 2021-04-03 MED ORDER — POLYETHYLENE GLYCOL 3350 17 G PO PACK: 17.0000 g | PACK | Freq: Every day | ORAL | Status: DC | PRN

## 2021-04-03 MED ORDER — MIRTAZAPINE 15 MG PO TABS: 15.0000 mg | ORAL_TABLET | Freq: Every day | ORAL | Status: DC

## 2021-04-03 MED ORDER — LEVOTHYROXINE SODIUM 75 MCG PO TABS
75.0000 ug | ORAL_TABLET | Freq: Every day | ORAL | Status: DC
  Administered 2021-04-04 – 2021-04-16 (×13): 75 ug via ORAL
  Filled 2021-04-03 (×13): qty 1

## 2021-04-03 MED ORDER — ACETAMINOPHEN 325 MG PO TABS
325.0000 mg | ORAL_TABLET | ORAL | Status: DC | PRN
  Filled 2021-04-03: qty 2

## 2021-04-03 MED ORDER — CHLORHEXIDINE GLUCONATE CLOTH 2 % EX PADS
6.0000 | MEDICATED_PAD | Freq: Every day | CUTANEOUS | Status: DC
  Administered 2021-04-04 – 2021-04-10 (×4): 6 via TOPICAL

## 2021-04-03 MED ORDER — NEPRO/CARBSTEADY PO LIQD
237.0000 mL | Freq: Three times a day (TID) | ORAL | Status: DC
  Administered 2021-04-04 – 2021-04-16 (×37): 237 mL via ORAL

## 2021-04-03 MED ORDER — ALPRAZOLAM 0.25 MG PO TABS
0.2500 mg | ORAL_TABLET | Freq: Two times a day (BID) | ORAL | Status: DC | PRN
  Administered 2021-04-03 – 2021-04-14 (×10): 0.25 mg via ORAL
  Filled 2021-04-03 (×10): qty 1

## 2021-04-03 MED ORDER — ADULT MULTIVITAMIN W/MINERALS CH: 1.0000 | ORAL_TABLET | Freq: Every day | ORAL | Status: AC

## 2021-04-03 MED ORDER — BLISTEX MEDICATED EX OINT: 1.0000 "application " | TOPICAL_OINTMENT | CUTANEOUS | Status: DC | PRN

## 2021-04-03 MED ORDER — DOCUSATE SODIUM 50 MG/5ML PO LIQD
100.0000 mg | Freq: Two times a day (BID) | ORAL | Status: DC
  Administered 2021-04-03 – 2021-04-04 (×2): 100 mg via ORAL
  Filled 2021-04-03 (×2): qty 10

## 2021-04-03 MED ORDER — BLISTEX MEDICATED EX OINT: TOPICAL_OINTMENT | CUTANEOUS | Status: DC | PRN

## 2021-04-03 MED ORDER — INSULIN ASPART 100 UNIT/ML ~~LOC~~ SOLN: 0.0000 [IU] | Freq: Every day | SUBCUTANEOUS | 11 refills | Status: DC

## 2021-04-03 MED ORDER — FLEET ENEMA 7-19 GM/118ML RE ENEM: 1.0000 | ENEMA | Freq: Once | RECTAL | Status: DC | PRN

## 2021-04-03 MED ORDER — MIRTAZAPINE 15 MG PO TABS
15.0000 mg | ORAL_TABLET | Freq: Every day | ORAL | Status: DC
  Administered 2021-04-03 – 2021-04-15 (×13): 15 mg via ORAL
  Filled 2021-04-03 (×13): qty 1

## 2021-04-03 MED ORDER — DRONABINOL 2.5 MG PO CAPS: 2.5000 mg | ORAL_CAPSULE | Freq: Two times a day (BID) | ORAL | Status: DC

## 2021-04-03 NOTE — TOC Transition Note (Signed)
Transition of Care [facility]) - CM/SW Discharge Note   Patient Details  Name: Shannara Winbush MRN: [id number] Date of Birth: 11/16/1951  [facility] CM/SW Contact:  Pete Pelt, RN Phone Number: 04/03/2021, 11:00 AM   Clinical Narrative:   Patient aware that she is going to CIR today, declined offer for [facility] to contact anyone, states she will take care of this.  Per B. Boyette at [facility], bed has been confirmed and [facility] will transport patient at 1130 AM.        Barriers to Discharge: Other (comment)   Patient Goals and CMS Choice Patient states their goals for this hospitalization and ongoing recovery are:: Family is interested in [place] CMS Medicare.gov Compare Post Acute Care list provided to:: Patient Represenative (must comment) Choice offered to / list presented to : Adult Children  Discharge Placement                       Discharge Plan and Services   Discharge Planning Services: CM Consult Post Acute Care Choice: IP Rehab          DME Arranged: N/A DME Agency: NA       HH Arranged: NA          Social Determinants of Health (SDOH) Interventions     Readmission Risk Interventions No flowsheet data found.

## 2021-04-03 NOTE — H&P (Shared)
Physical Medicine and Rehabilitation Admission H&P    Chief Complaint  Patient presents with  . debility    HPI: Desiree Madden is a 70 year old female with history of HTN, ulcerative colitis, colovesical fistula, T11 compression fracture, diveticulits who was admitted on 02/21/21 with severe abdominal pain concerning for acute diverticulitis and large stool burden seen in colon as well as moderately progressive severe T11 compression Fx. She was started on antibiotics for UTI as well as diverticulitis. GI  recommended supportive care and augmentation of bowel regimen.  She hs progressive leucocytosis and repeat CT abdomen 3/19 showing worsening of paracolic stranding with substantial localized wall thickening along 9 cm suspicious for progressive diverticulitis and regional colitis. Antibiotics switched to Zosyn with improvement in WBC. Bowel prep ordered with consideration of gastrografin enema for workup per Dr. Claudine Mouton (General Surgery). She developed fever with recurrent rise in WBC and CT abdomen showed abscess with question of fistula formation.  She underwent CT guided drainage of 100 cc fluid from LLQ abscess on 03/23 but continued to have rise in WBC with ongoing pain. She was taken to OR on 03/24 for Sigmoid colectomy with end colostomy, Hartman's procedure by Dr. Claudine Mouton and Cystorrhapy by Dr. Richardo Hanks.   Post op course significant for septic shock requiring prolonged intubation, MODS,  NSTEMI, severe anasarca and severe agitation due to delirium. Dr. Juliann Pares recommended conservative care with IV heparin once cleared by surgery. 2D echo showed EF 45-50% and Amiodarone added for episodes of wide complex tachycardia with PAF. She was transitioned to Eliquis.   She underwent tracheostomy by Dr. Willeen Cass on 03/31 and as mentation improved, tolerated extubation to ATC by 04/09.  She was started on dysphagia 1, nectar liquids dud to dysphagia. Foley placed on 04/08 due to urinary retention  with follow up cystogram in 2 weeks showing no evidence of leak. She continued to have problems voiding with retention and foley was replaced on 04/24.  Encephalopathy is resolving but continues to have bouts of lethargy and poor intake. She was noted to have moderate to severe depression therefore Remeron added in addition to low dose marinol to stimulate appetite.  Therapy ongoing to address weakness due to debility as well as dysphagia. She is tolerating PMSV, is showing improvement in activity tolerance but noted to have cognitive deficits as well as anxiety with activity. CIR recommended due to functional decline.     Review of Systems  Constitutional: Negative for chills and fever.  HENT: Negative for hearing loss and tinnitus.   Eyes: Negative for blurred vision and double vision.  Respiratory: Positive for cough and shortness of breath. Negative for sputum production.   Cardiovascular: Negative for chest pain and palpitations.  Gastrointestinal: Negative for abdominal pain, heartburn and nausea.  Genitourinary: Negative for dysuria and urgency.  Musculoskeletal: Positive for back pain (limits walking to about 15 minutes) and myalgias.  Skin: Negative for rash.  Neurological: Positive for focal weakness and weakness. Negative for dizziness and headaches.  Psychiatric/Behavioral: The patient has insomnia (chronic--stays up at nights watching TV).      Past Medical History:  Diagnosis Date  . Hypertension   . Thyroid disease     Past Surgical History:  Procedure Laterality Date  . CARDIAC SURGERY    . COLECTOMY WITH COLOSTOMY CREATION/HARTMANN PROCEDURE Left 03/01/2021   Procedure: COLECTOMY WITH COLOSTOMY CREATION/HARTMANN PROCEDURE;  Surgeon: Campbell Lerner, MD;  Location: [facility] ORS;  Service: General;  Laterality: Left;  . COLOSTOMY REVISION N/A 03/09/2021  Procedure: COLOSTOMY REVISION;  Surgeon: Ronny Bacon, MD;  Location: [facility] ORS;  Service: General;  Laterality: N/A;  .  TRACHEOSTOMY TUBE PLACEMENT N/A 03/08/2021   Procedure: TRACHEOSTOMY;  Surgeon: Clyde Canterbury, MD;  Location: [facility] ORS;  Service: ENT;  Laterality: N/A;    Family History  Problem Relation Age of Onset  . Arthritis Other   . CAD Mother     Social History: Married. Retired Financial planner. She reports that she has quit smoking 15 years ago. She smoked 1 PPD for 40 years.  She has never used smokeless tobacco. She reports that she does not drink alcohol and does not use drugs.     Allergies  Allergen Reactions  . Baclofen     Other reaction(s): Headache  . Sertraline     Other reaction(s): Other (See Comments), Other (See Comments) Muscle aches Muscle aches   . Sulfa Antibiotics     Other reaction(s): Other (See Comments), Other (See Comments) Pt was told not to take it due to colitis Pt was told not to take it due to colitis   . Ibuprofen Nausea And Vomiting  . Trazodone And Nefazodone     Medications Prior to Admission  Medication Sig Dispense Refill  . alendronate (FOSAMAX) 70 MG tablet Take 70 mg by mouth once a week. Take with a full glass of water on an empty stomach.    Marland Kitchen aspirin EC 81 MG tablet Take 81 mg by mouth daily.    Marland Kitchen atorvastatin (LIPITOR) 20 MG tablet Take 20 mg by mouth daily.    . diclofenac Sodium (VOLTAREN) 1 % GEL Apply topically 4 (four) times daily.    . fluticasone (FLONASE) 50 MCG/ACT nasal spray Place 2 sprays into both nostrils daily.    Marland Kitchen gabapentin (NEURONTIN) 300 MG capsule Take 300 mg by mouth 3 (three) times daily.    Marland Kitchen levothyroxine (SYNTHROID) 75 MCG tablet Take 75 mcg by mouth daily before breakfast.    . meloxicam (MOBIC) 15 MG tablet Take 15 mg by mouth daily.    . metoprolol succinate (TOPROL-XL) 50 MG 24 hr tablet Take 50 mg by mouth daily.    Marland Kitchen oxyCODONE (OXY IR/ROXICODONE) 5 MG immediate release tablet Take 5 mg by mouth every 12 (twelve) hours as needed.    . traZODone (DESYREL) 100 MG tablet Take 50-100 mg by mouth at bedtime. Take  1/2 tablet by mouth each evening for sleep, can increase to $RemoveBef'100mg'wbKMHjKpDr$  whole tablet after 1st week.      Drug Regimen Review { DRUG REGIMEN [id number]}  Home: Home Living Family/patient expects to be discharged to:: Private residence Living Arrangements: Spouse/significant other (daughter lives in in Museum/gallery curator) Available Help at Discharge: Family,Available 24 hours/day (daughter works remote from home) Type of Home: House Home Access: Stairs to enter Technical brewer of Steps: [place]: One level Bathroom Shower/Tub: Chiropodist: Programmer, systems: Yes Home Equipment: None  Lives With: Spouse,Daughter   Functional History: Prior Function Level of Independence: Independent Comments: Pt reports that she is active in the community and independent w/o AD.  Functional Status:  Mobility: Bed Mobility Overal bed mobility: Needs Assistance Bed Mobility: Supine to Sit Rolling: Min assist,Mod assist Sidelying to sit: Mod assist Supine to sit: Min assist Sit to supine: Mod assist Sit to sidelying: Max assist General bed mobility comments: Not assessed, in recliner upon arrival Transfers Overall transfer level: Needs assistance Equipment used: Rolling walker (2 wheeled) Transfers: Sit to/from Stand Sit to  Stand: Mod assist Stand pivot transfers: Max assist General transfer comment: x4 bouts performed. Required MOD A for upward momentum and verbal cues for safe hand placement during each bout Ambulation/Gait Ambulation/Gait assistance: Min assist,Mod assist Gait Distance (Feet): 14 Feet (1+ 38ft, + 46ft) Assistive device: Rolling walker (2 wheeled) Gait Pattern/deviations: Step-to pattern General Gait Details: ambulated 23ft to chair, additional 5ft and then additional 50ft Gait velocity: decreased Stairs:  (deferred secondary to abdominal discomfort)    ADL: ADL Overall ADL's : Needs assistance/impaired Grooming: Oral care,Brushing  hair,Set up,Min guard,Standing Grooming Details (indicate cue type and reason): MIN GUARD for standing balance. Upper Body Bathing: Moderate assistance,Standing Upper Body Bathing Details (indicate cue type and reason): MOD A for washing abdomen/trunk. Pt able to wash UE with MIN GUARD for standing balance Lower Body Bathing: Minimal assistance,Sit to/from stand Lower Body Bathing Details (indicate cue type and reason): MIN A to wash back of thighs when standing Upper Body Dressing : Set up,Min guard,Sitting Upper Body Dressing Details (indicate cue type and reason): MIN GUARD to don/doff hospital gown while sitting EOB Lower Body Dressing: Sitting/lateral leans,Moderate assistance Lower Body Dressing Details (indicate cue type and reason): MIN GUARD to don/doff socks while seated EOB. MAX A to don/doff L LE AFO Functional mobility during ADLs: Minimal assistance,Rolling walker (MIN A to walk ~10 ft with RW, x2 times) General ADL Comments: min guard for sitting balance for pt to wash hair while seated on EOB. Pt performs oral care with set up A and min cuing for thoroughness.  Cognition: Cognition Overall Cognitive Status: No family/caregiver present to determine baseline cognitive functioning Orientation Level: Oriented X4 Cognition Arousal/Alertness: Awake/alert Behavior During Therapy: WFL for tasks assessed/performed Overall Cognitive Status: No family/caregiver present to determine baseline cognitive functioning Area of Impairment: Orientation,Following commands,Safety/judgement,Problem solving,Awareness,Attention Orientation Level: Disoriented to,Time,Situation Following Commands: Follows one step commands with increased time,Follows one step commands consistently,Follows multi-step commands inconsistently Safety/Judgement: Decreased awareness of safety,Decreased awareness of deficits Awareness: Intellectual Problem Solving: Difficulty sequencing,Requires verbal cues,Requires tactile  cues General Comments: Pt alert and oriented to self and situation. Demonstrates some anxiety with performing sit>stand transfers, requesting assistance from OT before attempting, however overall motivated to participate in standing ADLs. Able to follow 1-step commands consistently. Requires MIN verbal cues to follow 2-step commands Difficult to assess due to: Tracheostomy   Blood pressure 107/62, pulse 78, temperature 98.2 F (36.8 C), resp. rate 20, height 5' 4.02" (1.626 m), weight 64 kg, SpO2 100 %. Physical Exam Vitals and nursing note reviewed.  Constitutional:      Appearance: She is well-developed.     Comments: #6 cuffed trach in place  Pulmonary:     Effort: Pulmonary effort is normal.     Breath sounds: No stridor. Rhonchi present.  Abdominal:     Comments: Wound VAC on midline incision with serosanguinous drainage. LLQ ostomy with formed stool.   Skin:    General: Skin is dry.  Neurological:     Mental Status: She is alert and oriented to person, place, and time.     Results for orders placed or performed during the hospital encounter of 02/21/21 (from the past 48 hour(s))  Glucose, capillary     Status: None   Collection Time: 04/01/21  4:21 PM  Result Value Ref Range   Glucose-Capillary 74 70 - 99 mg/dL    Comment: Glucose reference range applies only to samples taken after fasting for at least 8 hours.  Glucose, capillary     Status:  None   Collection Time: 04/02/21  5:15 PM  Result Value Ref Range   Glucose-Capillary 81 70 - 99 mg/dL    Comment: Glucose reference range applies only to samples taken after fasting for at least 8 hours.  Basic metabolic panel     Status: Abnormal   Collection Time: 04/03/21  5:30 AM  Result Value Ref Range   Sodium 138 135 - 145 mmol/L   Potassium 3.4 (L) 3.5 - 5.1 mmol/L   Chloride 100 98 - 111 mmol/L   CO2 27 22 - 32 mmol/L   Glucose, Bld 88 70 - 99 mg/dL    Comment: Glucose reference range applies only to samples taken after  fasting for at least 8 hours.   BUN 32 (H) 8 - 23 mg/dL   Creatinine, Ser 0.92 0.44 - 1.00 mg/dL   Calcium 8.8 (L) 8.9 - 10.3 mg/dL   GFR, Estimated >60 >60 mL/min    Comment: (NOTE) Calculated using the CKD-EPI Creatinine Equation (2021)    Anion gap 11 5 - 15    Comment: Performed at [facility], [address] [place], [place] [address]   No results found.     Medical Problem List and Plan: 1.  *** secondary to ***  -patient may *** shower  -ELOS/Goals: *** 2.  Antithrombotics: -DVT/anticoagulation:  Pharmaceutical: Other (comment)--Eliquis  -antiplatelet therapy: N/A 3. Pain Management: Oxcodone prn.  4. Mood: LCSW to follow for evaluation and support.   -antipsychotic agents: N/A 5. Neuropsych: This patient is intermittently capable of making decisions on her own behalf. 6. Skin/Wound Care: Routine pressure relief measures.  7. Fluids/Electrolytes/Nutrition: Monitor I/O. Check lytes in am.  --PEG recommended by dietician due to poor intake.   --continue Vitamin C/Multivitamin 8. Perforated diverticulitis s/p sigmoid colectomy w/end colostomy: WOC following to education.  --Change miralax (on nectars) to Senna to keep stools soft.  9. NSTEMI/ICM/Chronic combined CHF: Compensated. On low dose Toprol XL 10. SVT/PAF: Monitor HR tid--continue amiodarone, toprol and Eliquis. 11. VDRF s/p trach: Monitor respiratory status--continue cuffed #6 shiley  --consult trach team to assist with trach care.  12. Hypokalemia/Hypomagnesemia: Due to malnutrition/critical illness  --Monitor and supplement as indicated.  --continue supplements to help with nutritional state.  --continue Marinol-->monitor for SE.  13. Situational depression: Continue Remeron for smood stabilization   --Xanax prn to manage anxiety.  14. Cystopexy/Urinary retention: Will add flomax  --start voiding trial tomorrow--discussed with patient.   15. Chronic insomnia: Continue Xanax prn. Melatonin  ineffective.   --Remeron should help too.    ***  Bary Leriche, PA-C 04/03/2021

## 2021-04-03 NOTE — Progress Notes (Signed)
Orthopedic Tech Progress Note Patient Details:  Desiree Madden [birth year] [id number] Called In order to [facility] for an AFO Patient ID: Desiree Madden, female   DOB: Aug 21, 1951, 70 y.o.   MRN: [id number]   Desiree Madden 04/03/2021, 1:57 PM

## 2021-04-03 NOTE — Progress Notes (Signed)
Inpatient [place] Individual Statement of Services  Patient Name:  Desiree Madden  Date:  04/03/2021  Welcome to the [facility].  Our goal is to provide you with an individualized program based on your diagnosis and situation, designed to meet your specific needs.  With this comprehensive rehabilitation program, you will be expected to participate in at least 3 hours of rehabilitation therapies Monday-Friday, with modified therapy programming on the weekends.  Your rehabilitation program will include the following services:  Physical Therapy (PT), Occupational Therapy (OT), Speech Therapy (ST), 24 hour per day rehabilitation nursing, Therapeutic Recreaction (TR), Neuropsychology, Care Coordinator, Rehabilitation Medicine, Nutrition Services and Pharmacy Services  Weekly team conferences will be held on Wednesday to discuss your progress.  Your Inpatient Rehabilitation Care Coordinator will talk with you frequently to get your input and to update you on team discussions.  Team conferences with you and your family in attendance may also be held.  Expected length of stay: 2.5-3 weeks  Overall anticipated outcome: supervision with cues  Depending on your progress and recovery, your program may change. Your Inpatient Rehabilitation Care Coordinator will coordinate services and will keep you informed of any changes. Your Inpatient Rehabilitation Care Coordinator's name and contact numbers are listed  below.  The following services may also be recommended but are not provided by the [facility] will be made to provide these services after discharge if needed.  Arrangements include referral to agencies that provide these services.  Your insurance has been verified to be:  [place] primary doctor is:  [facility]  Pertinent information will be shared with your doctor and your insurance company.  Inpatient Rehabilitation Care Coordinator:  Ovidio Kin, [place] or Emilia Beck  Information discussed with and copy given to patient by: [name], 04/03/2021, 2:32 PM

## 2021-04-03 NOTE — Progress Notes (Signed)
Inpatient Rehabilitation Care Coordinator Assessment and Plan Patient Details  Name: Desiree Madden MRN: [id number] Date of Birth: 1950-12-28  Today's Date: 04/03/2021  Hospital Problems: Active Problems:   Physical debility  Past Medical History:  Past Medical History:  Diagnosis Date  . Hypertension   . Thyroid disease    Past Surgical History:  Past Surgical History:  Procedure Laterality Date  . CARDIAC SURGERY    . COLECTOMY WITH COLOSTOMY CREATION/HARTMANN PROCEDURE Left 03/01/2021   Procedure: COLECTOMY WITH COLOSTOMY CREATION/HARTMANN PROCEDURE;  Surgeon: Ronny Bacon, MD;  Location: [facility] ORS;  Service: General;  Laterality: Left;  . COLOSTOMY REVISION N/A 03/09/2021   Procedure: COLOSTOMY REVISION;  Surgeon: Ronny Bacon, MD;  Location: [facility] ORS;  Service: General;  Laterality: N/A;  . TRACHEOSTOMY TUBE PLACEMENT N/A 03/08/2021   Procedure: TRACHEOSTOMY;  Surgeon: Clyde Canterbury, MD;  Location: [facility] ORS;  Service: ENT;  Laterality: N/A;   Social History:  reports that she has quit smoking. She has never used smokeless tobacco. She reports that she does not drink alcohol and does not use drugs.  Family / Support Systems Marital Status: Married Patient Roles: Spouse,Parent Spouse/Significant Other: Bill 50 yo has some health issues Children: Jessica-only child [phone] Other Supports: Andrew-son in-law [phone] Anticipated Caregiver: Daughter and son in-law along with pt's husband Ability/Limitations of Caregiver: Husband 74 yo has health issues-daughter works from home, son in-law works out of the home Caregiver Availability: 24/7 Family Dynamics: Close with daughter and son in-law live on the same property in a mobile home. Pt has a few friends also who will check on her and husband  Social History Preferred language: English Religion: None Cultural Background: No issues Education: HS Read: Yes Write: Yes Employment Status: Retired Special educational needs teacher Issues: No issues Guardian/Conservator: None-according to MD pt is capable of making her own decisions while here. Daughter plans to be here also to provide support   Abuse/Neglect Abuse/Neglect Assessment Can Be Completed: Yes Physical Abuse: Denies Verbal Abuse: Denies Sexual Abuse: Denies Exploitation of patient/patient's resources: Denies Self-Neglect: Denies  Emotional Status Pt's affect, behavior and adjustment status: Pt is motivated to do well and recover, she has always been independent and wants this trach out for the first thing. She feels she is so much better than she was. Daughter voiced she is stubborn and will do waht she needs too, to be independent again. Needs to get to her garden and plant Recent Psychosocial Issues: other health issues Psychiatric History: History of anxiety has gotten worse since being in the hospital this long. Takes medication but would benefit from seeing neuro-psych while here Substance Abuse History: No issues  Patient / Family Perceptions, Expectations & Goals Pt/Family understanding of illness & functional limitations: Pt and daughter have a good understanding of her issues, both have spoken with the MD's involved and feel they know what to expect. Pt is hopeful she can have trach out and then focus on education for colostomy and getting stronger Premorbid pt/family roles/activities: Wife, Mom, retiree, gardner, friend, etc Anticipated changes in roles/activities/participation: resume Pt/family expectations/goals: Pt states: " I want to get home soon so I can plant my garden, I like doing this."  Daughter states; " I hope she does well here, but she is ready to [employer] and improve."  [employer]: None Premorbid Home Care/DME Agencies: None Transportation available at discharge: Self will rely on family now Resource referrals recommended: Neuropsychology  Discharge Planning Living Arrangements:  Spouse/significant other Support Systems: Spouse/significant other,Children,Other  relatives,Friends/neighbors Type of Residence: Private residence [employer] Resources: Multimedia programmer (specify) Personnel officer Medicare) Financial Resources: Social Security,Family Support Financial Screen Referred: No Living Expenses: Own Money Management: Patient Does the patient have any problems obtaining your medications?: No Home Management: Self daughter to assist now Patient/Family Preliminary Plans: Return home with home with husband in their mobile home on daughter's property. Daughter and son in-law live in the house on the property. Daughter works from home-remotely and can assist if needed. Pt hopes to be mod/i by discharge from here but will have to see. Care Coordinator Anticipated Follow Up Needs: HH/OP  Clinical Impression Pleasant female who is used to being active and independent and not relying upon others. Her daughter is present in the room and supportive. Pt hopes to not need assist at discharge, but daughter works form home so is available if needed. Husband can be there with her but is elderly and needs to be careful. Will await team evaluations and work on discharge needs. Will add to neuro-psych list.  [name] 04/03/2021, 2:27 PM

## 2021-04-03 NOTE — Discharge Summary (Signed)
Physician Discharge Summary  [name] MEQ:[id number] DOB: June 15, 1951 DOA: 02/21/2021  PCP: [place] date: 02/21/2021 Discharge date: 04/03/2021  Admitted From: home Disposition:  CIR  Recommendations for Outpatient Follow-up:  1. Follow up with PCP in 1-2 weeks of d/c from rehab 2. Please obtain BMP/CBC/Mg in one week 3. Follow up with ENT for trach removal.   Dr. Richardson Landry indicated would see her in clinic for follow up and removal. Consider Pulm or ENT consult at [facility] for removal sooner if patient continues to be stable from respiratory standpoint as she has been for past two weeks. 4. Follow up with general surgery in 1 month, seen sooner if any issues/complications 5. Continue surgical wound care / wound vac management 6. Follow up with urology - appointment scheduled for May 5th for Foley removal and voiding trial.  Consider urology consult at [facility].  Home Health: N/A  Equipment/Devices: None   Discharge Condition: stable  CODE STATUS: DNR  Diet recommendation: Dysphagia level 1, nectar thick liquids, supplement drinks   Discharge Diagnoses: Principal Problem:   Diverticulitis Active Problems:   Fistula, bladder   Ulcerative colitis ([place])   Hyperlipidemia   Essential hypertension   Hypothyroidism   Abnormal ECG   CKD (chronic kidney disease), stage III (HCC)   Elevated lactic acid level   Protein-calorie malnutrition, severe    Summary of HPI and Hospital Course:  Desiree Madden is a 70 y.o. female with medical history significant for colonic bladder fistula, diverticulitis, hypertension, hypothyroidism, hyperlipidemia, ulcerative colitis, who presented to the hospital on 02/21/2021 because of abdominal pain.  She was admitted with acute diverticulitis.    Hospital course was complicated by perforated diverticulitis and she underwent emergent sigmoid colectomy with end colostomy and Hartman's procedure on 03/01/2021.  She was  transferred to the ICU postoperatively, developed septic shock requiring multiple vasopressors.  She remained intubated on mechanical ventilation postoperatively and subsequent difficulty liberating pt from the ventilator, failed extubation on 03/06/2021 and was reintubated.   ENT was consulted and tracheostomy was performed on 03/08/2021.     Septic shock, hemorrhagic shock secondary to complicated diverticulitis with perforation, pelvic abscess, colo-vesicular fistula, gangrene of the sigmoid colon: Shock physiology resolved, ICU course as outlined above.  S/p sigmoid colectomy with end colostomy, Hartman's procedure on 03/01/2021. Completed antibiotics on 03/15/2021.  --Wound VAC in place --Continue local wound care --Colostomy care --Follow-up planned with general surgery in one month --Contact general surgery if any issues or complications  Dysphagia - improving Anorexia - appetite improving Severe malnutrition - tolerating dysphagia 1 diet and nectar thick liquids 4/24-26: PO intake is improving with new medications  --encourage PO intake  --appreciate dietician and SLP recommendations --started on Marinol, Remeron also for depression and sleep --Unlikely to require PEG tube based on past few days intake improving  Acute on chronic hypoxic respiratory failure with tracheostomy - Resolved. Failure to wean from ventilator post-operatively, requiring tracheostomy.Patient has been tolerating room air since 4/19.   Uses TC overnight only for humidification. --continue humidification if pt prefers --Can be seen first available appointment with Dr. Richardson Landry ENT or  --Consider ENT or pulmonology consult at [facility] --Continue working with SLP   Acute urinary retention - Urology repaired colo-vesicular fistula.  Cystogram was done 4/19 and there was no evidence of leak.  Foley removed, but replaced 4/22 due to ongoing retention requiring in/out cath repeatedly. --Maintain Foley for  now --Urology to coordinate follow up [facility] --Will need removal of Foley for voiding trial, with bladder scans to ensure complete emptying  Tracheostomy in place - followed by ENT Dr. Richardson Landry while at [facility].   Patient has been stable from respiratory standpoint and on room air.   --Would consult Pulmonology or ENT at [facility] for removal  --Ongoing SLP, swallow evaluations, advancement of diet as appropriate  Hypokalemia / Hypomagnesemia - Replaced K and Mg.  --Repeat labs in 1 week, further replacement as needed  Non-anion gap metabolic acidosis - Resolved.  SVT, nonsustained, Sinus tachycardia, paroxysmal atrial fibrillation -  --Continue amiodarone and Eliquis.  NSTEMI, ischemic cardiomyopathy, chronic systolic and diastolic CHF - appears well compensated.  2D echo showed EF estimated at 20 to 25% and grade 1 diastolic dysfunction. --Continue Toprol-XL  Moderately severe depression - multifactorial.   Chart reviewed, PCP note of 01/16/2021: PHQ-9 score was 15 consistent with moderately severe depression.  This now likely complicated by all of above, having trach, wound vac, colostomy, prolonged hospital stay.   Patient and daughter agreeable to try on an antidepressant. -- Started on low-dose of Remeron qHS   (May also help with appetite and sleep) -- Close outpatient follow-up -- Monitor for efficacy and any side effects  Generalized weakness -related to her lengthy and complicated hospital admission.   Insurance approved for [employer] after peer-to-peer. -- D/c to CIR for ongoing acute rehab   Patient BMI: Body mass index is 23.19 kg/m.   DVT prophylaxis: SCDs Start: 02/21/21 2200 apixaban (ELIQUIS) tablet 5 mg       Diet Orders (From admission, onward)           Start     Ordered    03/28/21 1433  DIET - DYS 1 Room service appropriate? Yes with Assist; Fluid consistency: Nectar Thick  Diet effective now       Comments: Just a Puree Diet. May have Oatmeal  per Speech w/ butter, sugar. Yogurt, pudding, magic cup on trays. NO STRAWS.  Question Answer Comment  Room service appropriate? Yes with Assist   Fluid consistency: Nectar Thick                Discharge Instructions   Discharge Instructions    Call MD for:  extreme fatigue   Complete by: As directed    Call MD for:  persistant dizziness or light-headedness   Complete by: As directed    Call MD for:  persistant nausea and vomiting   Complete by: As directed    Call MD for:  redness, tenderness, or signs of infection (pain, swelling, redness, odor or green/yellow discharge around incision site)   Complete by: As directed    Call MD for:  severe uncontrolled pain   Complete by: As directed    Call MD for:  temperature >100.4   Complete by: As directed    Diet - low sodium heart healthy   Complete by: As directed    Discharge wound care:   Complete by: As directed    Per [place] RN and as outlined above.  Monitor wound vac seal closely.   Increase activity slowly   Complete by: As directed      Allergies as of 04/03/2021      Reactions   Baclofen    Other reaction(s): Headache   Sertraline    Other reaction(s): Other (See Comments), Other (See Comments) Muscle aches Muscle aches   Sulfa Antibiotics    Other reaction(s): Other (See Comments), Other (See Comments) Pt  was told not to take it due to colitis Pt was told not to take it due to colitis   Ibuprofen Nausea And Vomiting   Trazodone And Nefazodone       Medication List    STOP taking these medications   aspirin EC 81 MG tablet   diclofenac Sodium 1 % Gel Commonly known as: VOLTAREN   gabapentin 300 MG capsule Commonly known as: NEURONTIN   meloxicam 15 MG tablet Commonly known as: MOBIC   traZODone 100 MG tablet Commonly known as: DESYREL     TAKE these medications   alendronate 70 MG tablet Commonly known as: FOSAMAX Take 70 mg by mouth once a week. Take with a full glass of water on an empty  stomach.   ALPRAZolam 0.25 MG tablet Commonly known as: XANAX Take 1 tablet (0.25 mg total) by mouth 2 (two) times daily as needed for anxiety or sleep.   amiodarone 200 MG tablet Commonly known as: PACERONE Take 1 tablet (200 mg total) by mouth 2 (two) times daily.   apixaban 5 MG Tabs tablet Commonly known as: ELIQUIS Take 1 tablet (5 mg total) by mouth 2 (two) times daily.   ascorbic acid 500 MG tablet Commonly known as: VITAMIN C Take 1 tablet (500 mg total) by mouth 2 (two) times daily.   atorvastatin 20 MG tablet Commonly known as: LIPITOR Take 20 mg by mouth daily.   chlorhexidine gluconate (MEDLINE KIT) 0.12 % solution Commonly known as: PERIDEX 15 mLs by Mouth Rinse route 2 (two) times daily.   docusate 50 MG/5ML liquid Commonly known as: COLACE Take 10 mLs (100 mg total) by mouth 2 (two) times daily.   dronabinol 2.5 MG capsule Commonly known as: MARINOL Take 1 capsule (2.5 mg total) by mouth 2 (two) times daily before lunch and supper.   feeding supplement (NEPRO CARB STEADY) Liqd Take 237 mLs by mouth 3 (three) times daily between meals.   fluticasone 50 MCG/ACT nasal spray Commonly known as: FLONASE Place 2 sprays into both nostrils daily.   insulin aspart 100 UNIT/ML injection Commonly known as: novoLOG Inject 0-9 Units into the skin daily. Correction coverage: Sensitive  CBG < 70: Hypoglycemia protocol  CBG 70 - 120:  0 units  CBG 121 - 150: 1 unit  CBG 151 - 200: 2 units  CBG 201 - 250: 3 units  CBG 251 - 300: 5 units  CBG 301 - 350: 7 units  CBG 351 - 400: 9 units  CBG > 400: call MD and obtain STAT lab verification   levothyroxine 75 MCG tablet Commonly known as: SYNTHROID Take 1 tablet (75 mcg total) by mouth daily at 6 (six) AM. Start taking on: April 04, 2021 What changed:   medication strength  how much to take  when to take this  Another medication with the same name was removed. Continue taking this medication, and follow the  directions you see here.   lip balm Oint Apply 1 application topically as needed for lip care.   metoprolol succinate 25 MG 24 hr tablet Commonly known as: TOPROL-XL Take 0.5 tablets (12.5 mg total) by mouth daily. What changed:   medication strength  how much to take   mirtazapine 15 MG tablet Commonly known as: REMERON Take 1 tablet (15 mg total) by mouth at bedtime.   mouth rinse Liqd solution 15 mLs by Mouth Rinse route 2 (two) times daily.   multivitamin with minerals Tabs tablet Take 1 tablet by mouth  daily. Start taking on: April 04, 2021   ondansetron 4 MG tablet Commonly known as: ZOFRAN Take 1 tablet (4 mg total) by mouth every 6 (six) hours as needed for nausea.   oxyCODONE 5 MG immediate release tablet Commonly known as: Oxy IR/ROXICODONE Take 1 tablet (5 mg total) by mouth every 6 (six) hours as needed for moderate pain. What changed:   when to take this  reasons to take this   pantoprazole 40 MG tablet Commonly known as: PROTONIX Take 1 tablet (40 mg total) by mouth daily. Start taking on: April 04, 2021   polyethylene glycol 17 g packet Commonly known as: MIRALAX / GLYCOLAX Take 17 g by mouth daily. Start taking on: April 04, 2021            Discharge Care Instructions  (From admission, onward)         Start     Ordered   04/03/21 0000  Discharge wound care:       Comments: Per [place] RN and as outlined above.  Monitor wound vac seal closely.   04/03/21 1053          Allergies  Allergen Reactions  . Baclofen     Other reaction(s): Headache  . Sertraline     Other reaction(s): Other (See Comments), Other (See Comments) Muscle aches Muscle aches   . Sulfa Antibiotics     Other reaction(s): Other (See Comments), Other (See Comments) Pt was told not to take it due to colitis Pt was told not to take it due to colitis   . Ibuprofen Nausea And Vomiting  . Trazodone And Nefazodone      If you experience worsening of your  admission symptoms, develop shortness of breath, life threatening emergency, suicidal or homicidal thoughts you must seek medical attention immediately by calling 911 or calling your MD immediately  if symptoms less severe.    Please note   You were cared for by a hospitalist during your hospital stay. If you have any questions about your discharge medications or the care you received while you were in the hospital after you are discharged, you can call the unit and asked to speak with the hospitalist on call if the hospitalist that took care of you is not available. Once you are discharged, your primary care physician will handle any further medical issues. Please note that NO REFILLS for any discharge medications will be authorized once you are discharged, as it is imperative that you return to your primary care physician (or establish a relationship with a primary care physician if you do not have one) for your aftercare needs so that they can reassess your need for medications and monitor your lab values.   Consultations:  General surgery  Urology  ENT  PCCM  Cardiology    Procedures/Studies: DG Abd 1 View  Result Date: 03/20/2021 CLINICAL DATA:  Check feeding catheter placement EXAM: ABDOMEN - 1 VIEW COMPARISON:  03/17/2021 FINDINGS: Gastric catheter is noted coiled within the stomach with the tip directed towards the fundus. The overall appearance is relatively stable from the prior exam. Ostomy is noted in the left mid abdomen. No free air is seen. No bony abnormality is noted. IMPRESSION: Gastric catheter coiled within the stomach as described. Electronically Signed   By: Inez Catalina M.D.   On: 03/20/2021 12:39   DG Abd 1 View  Result Date: 03/17/2021 CLINICAL DATA:  NG tube placement. EXAM: ABDOMEN - 1 VIEW COMPARISON:  Earlier same  day FINDINGS: NG tube is positioned in the proximal stomach with the proximal side port in the gastric fundus. Nonspecific bowel gas pattern  evident. IMPRESSION: NG tube tip is in the stomach. Electronically Signed   By: Misty Stanley M.D.   On: 03/17/2021 17:51   DG Abd 1 View  Result Date: 03/17/2021 CLINICAL DATA:  Enteric tube placement. EXAM: ABDOMEN - 1 VIEW COMPARISON:  Abdominal x-ray dated March 08, 2021. FINDINGS: Enteric tube in the stomach with the tip at the fundus. The bowel gas pattern is normal. No radio-opaque calculi or other significant radiographic abnormality are seen. IMPRESSION: 1. Enteric tube in the stomach. Electronically Signed   By: Titus Dubin M.D.   On: 03/17/2021 14:17   DG Abd 1 View  Result Date: 03/08/2021 CLINICAL DATA:  NG tube placement. EXAM: ABDOMEN - 1 VIEW COMPARISON:  03/08/2021. FINDINGS: NG tube tip coiled in the stomach. No gastric or bowel distention. Ostomy noted over the left lower quadrant. Drainage catheter noted left lower quadrant. Prior CABG. IMPRESSION: NG tube noted with tip coiled in the stomach. Electronically Signed   By: Marcello Moores  Register   On: 03/08/2021 14:00   DG Abd 1 View  Result Date: 03/08/2021 CLINICAL DATA:  Nasogastric tube placement. EXAM: ABDOMEN - 1 VIEW COMPARISON:  None. FINDINGS: The bowel gas pattern is normal. Nasogastric tube tip is seen in proximal stomach. Colostomy is noted in left lower quadrant. No radio-opaque calculi or other significant radiographic abnormality are seen. IMPRESSION: Nasogastric tube tip seen in proximal stomach. No evidence of bowel obstruction or ileus. Electronically Signed   By: Marijo Conception M.D.   On: 03/08/2021 11:04   DG Cystogram  Result Date: 03/27/2021 CLINICAL DATA:  Post bladder repair EXAM: CYSTOGRAM TECHNIQUE: The bladder was filled with 250 mL Cysto-conray 30% by drip infusion utilizing indwelling Foley catheter. Serial spot images were obtained during bladder filling. FLUOROSCOPY TIME:  Fluoroscopy Time:  18 seconds Radiation Exposure Index (if provided by the fluoroscopic device): 1.9 mGy COMPARISON:  None. FINDINGS:  Filling of the bladder demonstrates no contour abnormality or filling defect apart from Foley balloon. There is no evidence of contrast extravasation. There is no reflux into the ureters. IMPRESSION: No evidence of leak. Electronically Signed   By: Macy Mis M.D.   On: 03/27/2021 11:58   DG Chest Port 1 View  Result Date: 03/20/2021 CLINICAL DATA:  Possible aspiration EXAM: PORTABLE CHEST 1 VIEW COMPARISON:  03/18/2021 FINDINGS: Cardiac shadow is stable. Postsurgical changes are again seen. Tracheostomy tube and gastric catheter are noted in satisfactory position. Right-sided PICC line is noted at the cavoatrial junction. The lungs are well aerated bilaterally. Mild atelectasis is again seen in the right base. No new focal infiltrate is seen. IMPRESSION: Stable appearance when compare with the prior exam. No findings to suggest aspiration are seen. Electronically Signed   By: Inez Catalina M.D.   On: 03/20/2021 12:38   DG Chest Port 1 View  Result Date: 03/18/2021 CLINICAL DATA:  Sepsis EXAM: PORTABLE CHEST 1 VIEW COMPARISON:  March 12, 2021 FINDINGS: Tracheostomy catheter tip is 5.4 cm above the carina. Central catheter tip in right atrium slightly distal to the cavoatrial junction. Nasogastric tube tip and side port in stomach. No pneumothorax. There is atelectatic change in the right base. Lungs elsewhere are clear. The heart size and pulmonary vascular normal. Patient is status post coronary artery bypass grafting. No bone lesions. IMPRESSION: Tube and catheter positions as described without pneumothorax. Right  base atelectasis. Lungs otherwise clear. Stable cardiac silhouette. Electronically Signed   By: Lowella Grip III M.D.   On: 03/18/2021 10:43   DG Chest Port 1 View  Result Date: 03/12/2021 CLINICAL DATA:  Acute respiratory failure EXAM: PORTABLE CHEST 1 VIEW COMPARISON:  Prior chest x-ray 03/06/2021 FINDINGS: A tracheostomy tube is now present. The tip is midline and at the level of the  clavicles. Right upper extremity PICC in good position with the tip at the superior cavoatrial junction. Well-positioned gastric tube with the tip overlying the gastric fundus. Patient is status post median sternotomy with evidence of prior multivessel CABG. The cardiac and mediastinal contours are normal. Markedly decreased pulmonary vascular congestion and interstitial edema compared to prior. Chronic bronchitic changes and lingular atelectasis versus scarring. No large pleural effusion or pneumothorax. No acute osseous abnormality. IMPRESSION: 1. Interval resolution of pulmonary edema compared to 03/06/2021. 2. Interval placement of a tracheostomy tube which is in good position. 3. Other support apparatus remain in stable and satisfactory position. 4. Chronic bronchitic changes and lingular atelectasis versus scarring. Electronically Signed   By: Jacqulynn Cadet M.D.   On: 03/12/2021 06:51   Portable Chest x-ray  Result Date: 03/06/2021 CLINICAL DATA:  ETT placement EXAM: PORTABLE CHEST 1 VIEW COMPARISON:  03/04/2021 FINDINGS: Endotracheal tube with the tip 3.2 cm above the carina. Nasogastric tube coiled in the stomach. Right subclavian central venous catheter with the tip projecting over the SVC. Bilateral interstitial and alveolar airspace opacities. Small left pleural effusion. No pneumothorax. Stable cardiomediastinal silhouette. No acute osseous abnormality. IMPRESSION: 1. Endotracheal tube with the tip 3.2 cm above the carina. 2. Nasogastric tube coiled in the stomach. 3. Bilateral interstitial and alveolar airspace opacities concerning for pulmonary edema. Electronically Signed   By: Kathreen Devoid   On: 03/06/2021 18:30   ECHOCARDIOGRAM LIMITED  Result Date: 03/18/2021    ECHOCARDIOGRAM LIMITED REPORT   Patient Name:   Desiree Madden Date of Exam: 03/16/2021 Medical Rec #:  [id number]      Height:       64.0 in Accession #:    [id number]     Weight:       148.1 lb Date of Birth:  Sep 17, 1951     BSA:           1.722 m Patient Age:    89 years       BP: Patient Gender: F              HR: Exam Location:  [facility] Procedure: 2D Echo Indications:     R06.02 SOB  History:         CHF and Cardiomyopathy.  Sonographer:     JERRY Referring Phys:  [id number] DWAYNE D CALLWOOD Diagnosing Phys: Bartholome Bill MD IMPRESSIONS  1. Left ventricular ejection fraction, by estimation, is 20 to 25%. The left ventricle has severely decreased function. The left ventricular internal cavity size was mildly dilated. There is moderate left ventricular hypertrophy. Left ventricular diastolic parameters are consistent with Grade I diastolic dysfunction (impaired relaxation).  2. Left atrial size was mildly dilated.  3. Right atrial size was mildly dilated.  4. The mitral valve is grossly normal. Mild mitral valve regurgitation.  5. The aortic valve was not well visualized. Aortic valve regurgitation is trivial. FINDINGS  Left Ventricle: Left ventricular ejection fraction, by estimation, is 20 to 25%. The left ventricle has severely decreased function. The left ventricular internal cavity size was mildly dilated. There is moderate left ventricular hypertrophy.  Left ventricular diastolic parameters are consistent with Grade I diastolic dysfunction (impaired relaxation). Left Atrium: Left atrial size was mildly dilated. Right Atrium: Right atrial size was mildly dilated. Pericardium: There is no evidence of pericardial effusion. Mitral Valve: The mitral valve is grossly normal. Mild mitral valve regurgitation. Tricuspid Valve: The tricuspid valve is not well visualized. Tricuspid valve regurgitation is trivial. Aortic Valve: The aortic valve was not well visualized. Aortic valve regurgitation is trivial. Pulmonic Valve: The pulmonic valve was not assessed. Aorta: The aortic root is normal in size and structure. IAS/Shunts: The interatrial septum was not assessed. LEFT VENTRICLE PLAX 2D LVIDd:         4.50 cm LVIDs:         4.27 cm LV PW:         1.28  cm LV IVS:        1.23 cm  LEFT ATRIUM         Index LA diam:    4.10 cm 2.38 cm/m   AORTA Ao Root diam: 3.10 cm Bartholome Bill MD Electronically signed by Bartholome Bill MD Signature Date/Time: 03/18/2021/11:29:01 AM    Final        Subjective: Pt doing well today.  In good spirits and very happy to be going to rehab today.  She denies pain, nausea/vomiting, CP or SOB.  No acute complaints.  Asks about trach coming out and what will be done with colostomy in long term.  Advised surgery would follow up and discuss with her about that.     Discharge Exam: Vitals:   04/03/21 0515 04/03/21 0723  BP: 123/66 107/62  Pulse: 78 78  Resp: 16 20  Temp: 98.7 F (37.1 C) 98.2 F (36.8 C)  SpO2: 95% 100%   Vitals:   04/03/21 0000 04/03/21 0500 04/03/21 0515 04/03/21 0723  BP: (!) 114/59  123/66 107/62  Pulse: 72  78 78  Resp: _0 Temp: 98 F (36.7 C)  98.7 F (37.1 C) 98.2 F (36.8 C)  TempSrc: Oral  Oral   SpO2: 100%  95% 100%  Weight:  64 kg    Height:        General: Pt is alert, awake, not in acute distress HEENT: trach in place with PMV valve, hearing grossly normal, moist MM's Cardiovascular: RRR, S1/S2 +, no rubs, no gallops Respiratory: CTA bilaterally, no wheezing, no rhonchi Abdominal: Soft, NT, wound vac in place appears well-sealed, colostomy bag with some air and solid appearing brown stool Extremities: no edema, no cyanosis, feet contracted in plantarflexion L>R (stable and chronic)    The results of significant diagnostics from this hospitalization (including imaging, microbiology, ancillary and laboratory) are listed below for reference.     Microbiology: Recent Results (from the past 240 hour(s))  Resp Panel by RT-PCR (Flu A&B, Covid) Nasopharyngeal Swab     Status: None   Collection Time: 03/26/21 10:29 AM   Specimen: Nasopharyngeal Swab; Nasopharyngeal(NP) swabs in vial transport medium  Result Value Ref Range Status   SARS Coronavirus 2 by RT PCR  NEGATIVE NEGATIVE Final    Comment: (NOTE) SARS-CoV-2 target nucleic acids are NOT DETECTED.  The SARS-CoV-2 RNA is generally detectable in upper respiratory specimens during the acute phase of infection. The lowest concentration of SARS-CoV-2 viral copies this assay can detect is 138 copies/mL. A negative result does not preclude SARS-Cov-2 infection and should not be used as the sole basis for treatment or other patient management decisions. A negative  result may occur with  improper specimen collection/handling, submission of specimen other than nasopharyngeal swab, presence of viral mutation(s) within the areas targeted by this assay, and inadequate number of viral copies(<138 copies/mL). A negative result must be combined with clinical observations, patient history, and epidemiological information. The expected result is Negative.  Fact Sheet for Patients:  EntrepreneurPulse.com.au  Fact Sheet for Healthcare Providers:  IncredibleEmployment.be  This test is no t yet approved or cleared by the [place] FDA and  has been authorized for detection and/or diagnosis of SARS-CoV-2 by FDA under an Emergency Use Authorization (EUA). This EUA will remain  in effect (meaning this test can be used) for the duration of the COVID-19 declaration under Section 564(b)(1) of the Act, 21 U.S.C.section 360bbb-3(b)(1), unless the authorization is terminated  or revoked sooner.       Influenza A by PCR NEGATIVE NEGATIVE Final   Influenza B by PCR NEGATIVE NEGATIVE Final    Comment: (NOTE) The Xpert Xpress SARS-CoV-2/FLU/RSV plus assay is intended as an aid in the diagnosis of influenza from Nasopharyngeal swab specimens and should not be used as a sole basis for treatment. Nasal washings and aspirates are unacceptable for Xpert Xpress SARS-CoV-2/FLU/RSV testing.  Fact Sheet for Patients: EntrepreneurPulse.com.au  Fact Sheet for  Healthcare Providers: IncredibleEmployment.be  This test is not yet approved or cleared by the [place] FDA and has been authorized for detection and/or diagnosis of SARS-CoV-2 by FDA under an Emergency Use Authorization (EUA). This EUA will remain in effect (meaning this test can be used) for the duration of the COVID-19 declaration under Section 564(b)(1) of the Act, 21 U.S.C. section 360bbb-3(b)(1), unless the authorization is terminated or revoked.  Performed at [facility], [address] [place], [place] [address]      Labs: BNP (last 3 results) No results for input(s): BNP in the last 8760 hours. Basic Metabolic Panel: Recent Labs  Lab 03/28/21 0537 03/29/21 0525 03/30/21 0503 03/31/21 0904 04/03/21 0530  NA 138 137 137 136 138  K 3.8 3.5 3.8 3.6 3.4*  CL 105 103 103 99 100  CO2 _0 GLUCOSE 109* 95 89 91 88  BUN 35* 37* 32* 28* 32*  CREATININE 0.99 1.00 0.97 1.03* 0.92  CALCIUM 9.1 9.0 9.0 8.9 8.8*  MG 2.0 2.0 1.8 2.0  --   PHOS  --  4.6  --   --   --    Liver Function Tests: No results for input(s): AST, ALT, ALKPHOS, BILITOT, PROT, ALBUMIN in the last 168 hours. No results for input(s): LIPASE, AMYLASE in the last 168 hours. No results for input(s): AMMONIA in the last 168 hours. CBC: Recent Labs  Lab 03/29/21 0525 04/01/21 0455  WBC 6.9 7.3  HGB 9.0* 9.0*  HCT 27.3* 26.9*  MCV 94.1 91.2  PLT 367 323   Cardiac Enzymes: No results for input(s): CKTOTAL, CKMB, CKMBINDEX, TROPONINI in the last 168 hours. BNP: Invalid input(s): POCBNP CBG: Recent Labs  Lab 03/31/21 0039 03/31/21 0835 03/31/21 1619 04/01/21 1621 04/02/21 1715  GLUCAP 95 85 121* 74 81   D-Dimer No results for input(s): DDIMER in the last 72 hours. Hgb A1c No results for input(s): HGBA1C in the last 72 hours. Lipid Profile No results for input(s): CHOL, HDL, LDLCALC, TRIG, CHOLHDL, LDLDIRECT in the last 72 hours. Thyroid function  studies No results for input(s): TSH, T4TOTAL, T3FREE, THYROIDAB in the last 72 hours.  Invalid input(s): FREET3 Anemia work up No  results for input(s): VITAMINB12, FOLATE, FERRITIN, TIBC, IRON, RETICCTPCT in the last 72 hours. Urinalysis    Component Value Date/Time   COLORURINE YELLOW 02/21/2021 1543   APPEARANCEUR CLOUDY (A) 02/21/2021 1543   LABSPEC 1.020 02/21/2021 1543   PHURINE 7.0 02/21/2021 1543   GLUCOSEU NEGATIVE 02/21/2021 1543   HGBUR LARGE (A) 02/21/2021 1543   BILIRUBINUR NEGATIVE 02/21/2021 1543   KETONESUR NEGATIVE 02/21/2021 1543   PROTEINUR 100 (A) 02/21/2021 1543   NITRITE POSITIVE (A) 02/21/2021 1543   LEUKOCYTESUR LARGE (A) 02/21/2021 1543   Sepsis Labs Invalid input(s): PROCALCITONIN,  WBC,  LACTICIDVEN Microbiology Recent Results (from the past 240 hour(s))  Resp Panel by RT-PCR (Flu A&B, Covid) Nasopharyngeal Swab     Status: None   Collection Time: 03/26/21 10:29 AM   Specimen: Nasopharyngeal Swab; Nasopharyngeal(NP) swabs in vial transport medium  Result Value Ref Range Status   SARS Coronavirus 2 by RT PCR NEGATIVE NEGATIVE Final    Comment: (NOTE) SARS-CoV-2 target nucleic acids are NOT DETECTED.  The SARS-CoV-2 RNA is generally detectable in upper respiratory specimens during the acute phase of infection. The lowest concentration of SARS-CoV-2 viral copies this assay can detect is 138 copies/mL. A negative result does not preclude SARS-Cov-2 infection and should not be used as the sole basis for treatment or other patient management decisions. A negative result may occur with  improper specimen collection/handling, submission of specimen other than nasopharyngeal swab, presence of viral mutation(s) within the areas targeted by this assay, and inadequate number of viral copies(<138 copies/mL). A negative result must be combined with clinical observations, patient history, and epidemiological information. The expected result is Negative.  Fact  Sheet for Patients:  EntrepreneurPulse.com.au  Fact Sheet for Healthcare Providers:  IncredibleEmployment.be  This test is no t yet approved or cleared by the [place] FDA and  has been authorized for detection and/or diagnosis of SARS-CoV-2 by FDA under an Emergency Use Authorization (EUA). This EUA will remain  in effect (meaning this test can be used) for the duration of the COVID-19 declaration under Section 564(b)(1) of the Act, 21 U.S.C.section 360bbb-3(b)(1), unless the authorization is terminated  or revoked sooner.       Influenza A by PCR NEGATIVE NEGATIVE Final   Influenza B by PCR NEGATIVE NEGATIVE Final    Comment: (NOTE) The Xpert Xpress SARS-CoV-2/FLU/RSV plus assay is intended as an aid in the diagnosis of influenza from Nasopharyngeal swab specimens and should not be used as a sole basis for treatment. Nasal washings and aspirates are unacceptable for Xpert Xpress SARS-CoV-2/FLU/RSV testing.  Fact Sheet for Patients: EntrepreneurPulse.com.au  Fact Sheet for Healthcare Providers: IncredibleEmployment.be  This test is not yet approved or cleared by the [place] FDA and has been authorized for detection and/or diagnosis of SARS-CoV-2 by FDA under an Emergency Use Authorization (EUA). This EUA will remain in effect (meaning this test can be used) for the duration of the COVID-19 declaration under Section 564(b)(1) of the Act, 21 U.S.C. section 360bbb-3(b)(1), unless the authorization is terminated or revoked.  Performed at [facility], [address] [place], [place] [address]      Time coordinating discharge: Over 30 minutes  SIGNED:   Ezekiel Slocumb, DO [facility] 04/03/2021, 10:53 AM   If 7PM-7AM, please contact night-coverage [email or web address]

## 2021-04-03 NOTE — H&P (Signed)
Physical Medicine and Rehabilitation Admission H&P    Chief Complaint  Patient presents with  . debility    HPI: Desiree Madden is a 70 year old female with history of HTN, ulcerative colitis, colovesical fistula, T11 compression fracture, diveticulits who was admitted on 02/21/21 with severe abdominal pain concerning for acute diverticulitis and large stool burden seen in colon as well as moderately progressive severe T11 compression Fx. She was started on antibiotics for UTI as well as diverticulitis. GI  recommended supportive care and augmentation of bowel regimen.  She hs progressive leucocytosis and repeat CT abdomen 3/19 showing worsening of paracolic stranding with substantial localized wall thickening along 9 cm suspicious for progressive diverticulitis and regional colitis. Antibiotics switched to Zosyn with improvement in WBC. Bowel prep ordered with consideration of gastrografin enema for workup per Dr. Christian Mate (General Surgery). She developed fever with recurrent rise in WBC and CT abdomen showed abscess with question of fistula formation.  She underwent CT guided drainage of 100 cc fluid from LLQ abscess on 03/23 but continued to have rise in WBC with ongoing pain. She was taken to OR on 03/24 for Sigmoid colectomy with end colostomy, Hartman's procedure by Dr. Christian Mate and Cystorrhapy by Dr. Diamantina Providence.   Post op course significant for septic shock requiring prolonged intubation, MODS,  NSTEMI, severe anasarca and severe agitation due to delirium. Dr. Clayborn Bigness recommended conservative care with IV heparin once cleared by surgery. 2D echo showed EF 45-50% and Amiodarone added for episodes of wide complex tachycardia with PAF. She was transitioned to Eliquis.   She underwent tracheostomy by Dr. Richardson Landry on 03/31 and as mentation improved, tolerated extubation to ATC by 04/09.  She was started on dysphagia 1, nectar liquids dud to dysphagia. Foley placed on 04/08 due to urinary retention  with follow up cystogram in 2 weeks showing no evidence of leak. She continued to have problems voiding with retention and foley was replaced on 04/24.  Encephalopathy is resolving but continues to have bouts of lethargy and poor intake. She was noted to have moderate to severe depression therefore Remeron added in addition to low dose marinol to stimulate appetite.  Therapy ongoing to address weakness due to debility as well as dysphagia. She is tolerating PMSV, is showing improvement in activity tolerance but noted to have cognitive deficits as well as anxiety with activity. CIR recommended due to functional decline.    Pt reports her appetite is better- today with remeron.  She says she hasn't done any training with WOC for colostomy - which is not true according to documentation. Also c/o L foot drop.  Bowels working "OK"- but too firm for colostomy.  Willing to have Foley removed tomorrow and do in/out caths to get bladder working again.    Review of Systems  Constitutional: Negative for chills and fever.  HENT: Negative for hearing loss and tinnitus.   Eyes: Negative for blurred vision and double vision.  Respiratory: Positive for cough and shortness of breath. Negative for sputum production.   Cardiovascular: Negative for chest pain and palpitations.  Gastrointestinal: Positive for constipation. Negative for abdominal pain, heartburn and nausea.  Genitourinary: Negative for dysuria and urgency.       Cannot void  Musculoskeletal: Positive for back pain (limits walking to about 15 minutes) and myalgias.  Skin: Negative for rash.  Neurological: Positive for focal weakness and weakness. Negative for dizziness and headaches.  Psychiatric/Behavioral: The patient has insomnia (chronic--stays up at nights watching TV).  All other systems reviewed and are negative.    Past Medical History:  Diagnosis Date  . Hypertension   . Thyroid disease     Past Surgical History:  Procedure  Laterality Date  . CARDIAC SURGERY    . COLECTOMY WITH COLOSTOMY CREATION/HARTMANN PROCEDURE Left 03/01/2021   Procedure: COLECTOMY WITH COLOSTOMY CREATION/HARTMANN PROCEDURE;  Surgeon: Ronny Bacon, MD;  Location: [facility] ORS;  Service: General;  Laterality: Left;  . COLOSTOMY REVISION N/A 03/09/2021   Procedure: COLOSTOMY REVISION;  Surgeon: Ronny Bacon, MD;  Location: [facility] ORS;  Service: General;  Laterality: N/A;  . TRACHEOSTOMY TUBE PLACEMENT N/A 03/08/2021   Procedure: TRACHEOSTOMY;  Surgeon: Clyde Canterbury, MD;  Location: [facility] ORS;  Service: ENT;  Laterality: N/A;    Family History  Problem Relation Age of Onset  . Arthritis Other   . CAD Mother     Social History: Married. Retired Financial planner. She reports that she has quit smoking 15 years ago. She smoked 1 PPD for 40 years.  She has never used smokeless tobacco. She reports that she does not drink alcohol and does not use drugs.     Allergies  Allergen Reactions  . Baclofen     Other reaction(s): Headache  . Sertraline     Other reaction(s): Other (See Comments), Other (See Comments) Muscle aches Muscle aches   . Sulfa Antibiotics     Other reaction(s): Other (See Comments), Other (See Comments) Pt was told not to take it due to colitis Pt was told not to take it due to colitis   . Ibuprofen Nausea And Vomiting  . Trazodone And Nefazodone     Medications Prior to Admission  Medication Sig Dispense Refill  . alendronate (FOSAMAX) 70 MG tablet Take 70 mg by mouth once a week. Take with a full glass of water on an empty stomach.    . ALPRAZolam (XANAX) 0.25 MG tablet Take 1 tablet (0.25 mg total) by mouth 2 (two) times daily as needed for anxiety or sleep. 30 tablet 0  . amiodarone (PACERONE) 200 MG tablet Take 1 tablet (200 mg total) by mouth 2 (two) times daily.    Marland Kitchen apixaban (ELIQUIS) 5 MG TABS tablet Take 1 tablet (5 mg total) by mouth 2 (two) times daily. 60 tablet   . ascorbic acid (VITAMIN C) 500 MG tablet  Take 1 tablet (500 mg total) by mouth 2 (two) times daily.    Marland Kitchen atorvastatin (LIPITOR) 20 MG tablet Take 20 mg by mouth daily.    . chlorhexidine gluconate, MEDLINE KIT, (PERIDEX) 0.12 % solution 15 mLs by Mouth Rinse route 2 (two) times daily. 120 mL 0  . docusate (COLACE) 50 MG/5ML liquid Take 10 mLs (100 mg total) by mouth 2 (two) times daily. 100 mL 0  . dronabinol (MARINOL) 2.5 MG capsule Take 1 capsule (2.5 mg total) by mouth 2 (two) times daily before lunch and supper.    . fluticasone (FLONASE) 50 MCG/ACT nasal spray Place 2 sprays into both nostrils daily.    . insulin aspart (NOVOLOG) 100 UNIT/ML injection Inject 0-9 Units into the skin daily. Correction coverage: Sensitive  CBG < 70: Hypoglycemia protocol  CBG 70 - 120:  0 units  CBG 121 - 150: 1 unit  CBG 151 - 200: 2 units  CBG 201 - 250: 3 units  CBG 251 - 300: 5 units  CBG 301 - 350: 7 units  CBG 351 - 400: 9 units  CBG > 400: call MD and obtain  STAT lab verification 10 mL 11  . [START ON 04/04/2021] levothyroxine (SYNTHROID) 75 MCG tablet Take 1 tablet (75 mcg total) by mouth daily at 6 (six) AM.    . lip balm (BLISTEX) OINT Apply 1 application topically as needed for lip care.    . metoprolol succinate (TOPROL-XL) 25 MG 24 hr tablet Take 0.5 tablets (12.5 mg total) by mouth daily.    . mirtazapine (REMERON) 15 MG tablet Take 1 tablet (15 mg total) by mouth at bedtime.    . Mouthwashes (MOUTH RINSE) LIQD solution 15 mLs by Mouth Rinse route 2 (two) times daily.  0  . [START ON 04/04/2021] Multiple Vitamin (MULTIVITAMIN WITH MINERALS) TABS tablet Take 1 tablet by mouth daily.    . Nutritional Supplements (FEEDING SUPPLEMENT, NEPRO CARB STEADY,) LIQD Take 237 mLs by mouth 3 (three) times daily between meals.  0  . ondansetron (ZOFRAN) 4 MG tablet Take 1 tablet (4 mg total) by mouth every 6 (six) hours as needed for nausea. 20 tablet 0  . oxyCODONE (OXY IR/ROXICODONE) 5 MG immediate release tablet Take 1 tablet (5 mg total) by  mouth every 6 (six) hours as needed for moderate pain. 30 tablet 0  . [START ON 04/04/2021] pantoprazole (PROTONIX) 40 MG tablet Take 1 tablet (40 mg total) by mouth daily.    Derrill Memo ON 04/04/2021] polyethylene glycol (MIRALAX / GLYCOLAX) 17 g packet Take 17 g by mouth daily. 14 each 0    Drug Regimen Review  Drug regimen was reviewed and remains appropriate with no significant issues identified  Home: Home Living Living Arrangements: Spouse/significant other   Functional History:    Functional Status:  Mobility:          ADL:    Cognition: Cognition Orientation Level: Oriented X4     Blood pressure 127/67, pulse 86, temperature 99.5 F (37.5 C), temperature source Oral, resp. rate 16, SpO2 98 %. Physical Exam Vitals and nursing note reviewed.  Constitutional:      Appearance: She is well-developed.     Comments: #6 cuffed trach in place Pt sitting up in bed- daughter at bedside; initially, trach clogged, but nurse cleaned inner cannula- doing better- pt frail appearing, NAD  HENT:     Head: Normocephalic and atraumatic.     Comments: Poor dentition- missing teeth Smile equal    Right Ear: External ear normal.     Left Ear: External ear normal.     Nose: Nose normal. No congestion.     Mouth/Throat:     Mouth: Mucous membranes are moist.     Pharynx: Oropharynx is clear. No oropharyngeal exudate.  Eyes:     General:        Right eye: No discharge.        Left eye: No discharge.     Extraocular Movements: Extraocular movements intact.  Neck:     Comments: Trach #6 cuffed in place- didn't use PMV Cardiovascular:     Rate and Rhythm: Normal rate.     Heart sounds: Normal heart sounds. No murmur heard. No gallop.      Comments: Sounds RRR, no JVD Pulmonary:     Effort: Pulmonary effort is normal.     Breath sounds: No stridor. Rhonchi present.     Comments: Initially clear, but got mucus plug? and then developed some rhonchi- needed suctioning to clear   Abdominal:     Comments: Wound VAC on midline incision with serosanguinous drainage. LLQ ostomy with formed stool.  A lot of drainage on VAC foam initially, but once VAC plugged in, drained a lot of Serosanguinous drainage.  Soft, mildly TTP- no rebound; hyperactive BS  Genitourinary:    Comments: Has foley- light amber urine Musculoskeletal:     Comments: UEs- 5-/5 B/L in biceps, triceps, WE, grip and finger abd B/L LEs- RLE- HF 3/5, KE 4+/5, DF and PF 4/5 LLE- HF 3/5, KE 4/5, DF 0/5 and PF 4/5  Skin:    General: Skin is dry.     Comments: L buttock superficially appears excoriated- nothing open  Neurological:     Mental Status: She is alert and oriented to person, place, and time.     Comments: Intact to light touch in all 4 extremities Ox2-3 but slow to process/delayed responses  Psychiatric:     Comments: Flat, delayed     Results for orders placed or performed during the hospital encounter of 04/03/21 (from the past 48 hour(s))  Glucose, capillary     Status: None   Collection Time: 04/03/21  2:36 PM  Result Value Ref Range   Glucose-Capillary 80 70 - 99 mg/dL    Comment: Glucose reference range applies only to samples taken after fasting for at least 8 hours.  Glucose, capillary     Status: Abnormal   Collection Time: 04/03/21  4:57 PM  Result Value Ref Range   Glucose-Capillary 152 (H) 70 - 99 mg/dL    Comment: Glucose reference range applies only to samples taken after fasting for at least 8 hours.   No results found.     Medical Problem List and Plan: 1.  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   -patient may not shower until VAC removed  -ELOS/Goals: 2.5 to 3 weeks 2.  Antithrombotics: -DVT/anticoagulation:  Pharmaceutical: Other (comment)--Eliquis  -antiplatelet therapy: N/A 3. Pain Management: Oxcodone prn.  4. Mood: LCSW to follow for evaluation and support.    -antipsychotic agents: N/A 5. Neuropsych: This patient is intermittently capable of making decisions on her own behalf. 6. Skin/Wound Care: Routine pressure relief measures.  7. Fluids/Electrolytes/Nutrition: Monitor I/O. Check lytes in am.  --PEG recommended by dietician due to poor intake.   --continue Vitamin C/Multivitamin 8. Perforated diverticulitis s/p sigmoid colectomy w/end colostomy: WOC following to education. Might need to reconsult since hospital has changed.   --Change miralax (on nectars) to Senna to keep stools soft.  9. NSTEMI/ICM/Chronic combined CHF: Compensated. On low dose Toprol XL 10. SVT/PAF: Monitor HR tid--continue amiodarone, toprol and Eliquis. 11. VDRF s/p trach: Monitor respiratory status--continue cuffed #6 shiley  --consult trach team to assist with trach care.  12. Hypokalemia/Hypomagnesemia: Due to malnutrition/critical illness  --Monitor and supplement as indicated.  --continue supplements to help with nutritional state.  --continue Marinol-->monitor for SE.  13. Situational depression: Continue Remeron for smood stabilization   --Xanax prn to manage anxiety.  14. Cystopexy/Urinary retention: Will add flomax  --start voiding trial tomorrow--discussed with patient.   15. Chronic insomnia: Continue Xanax prn. Melatonin ineffective.   --Remeron should help too.   16. L foot drop- 0/5 DF on LLE- is new- needs Prevalons vs PRAFOs (has prevalon) when in bed- and also needs an AFO consult for L foot.    Bary Leriche- PA-C 04/03/2021   I have personally performed a face to face diagnostic evaluation of this patient and formulated the key components of the plan.  Additionally, I have personally reviewed laboratory  data, imaging studies, as well as relevant notes and concur with the physician assistant's documentation above.      Courtney Heys, MD 04/03/2021

## 2021-04-03 NOTE — TOC Benefit Eligibility Note (Signed)
Patient Teacher, English as a foreign language completed.    The patient is currently admitted and upon discharge could be taking Eliquis 5 mg.  The current 30 day co-pay is, $45.00.   The patient is insured through [place] Patient Advocate Specialist [place] Team Direct Number: [phone]  Fax: [phone]

## 2021-04-03 NOTE — Progress Notes (Addendum)
Inpatient Rehabilitation Medication Review by a Pharmacist  A complete drug regimen review was completed for this patient to identify any potential clinically significant medication issues.  Clinically significant medication issues were identified:  yes   Type of Medication Issue Identified Description of Issue Urgent (address now) Non-Urgent (address on AM team rounds) Plan Plan Accepted by Provider? (Yes / No / Pending AM Rounds)  Drug Interaction(s) (clinically significant)       Duplicate Therapy       Allergy       No Medication Administration End Date       Incorrect Dose       Additional Drug Therapy Needed  Patient was on Atorvastatin $RemoveBeforeD'20mg'[id number]$  daily PTA.  This medication was not resumed during their inpatient stay at [facility].  Per discharge med list, atorvastatin should be resumed. Non-Urgent Sent message to [facility] to resume Atorvastatin when indicated. Ok to resume per Dr. Posey Pronto  Other         Name of provider notified for urgent issues identified: Algis Liming  Provider Method of Notification: secure chat message   For non-urgent medication issues to be resolved on team rounds tomorrow morning a [facility] [place] was sent to:    Pharmacist comments: Resume Arovastatin when able.  Time spent performing this drug regimen review (minutes):  15

## 2021-04-03 NOTE — Progress Notes (Signed)
Pt arrived via EMS alert and oriented able to make needs known.  Arrived with #6 shiley cuffed trach, colostemy intact, foley drainging yellow fluid, skin intact with blanchable redness to peri area.  No complaints of pain, daughter at bedside, oriented to rehab.  All needs met, call bell within reach, bed alarm on and functional.

## 2021-04-03 NOTE — Progress Notes (Signed)
Pt d/c to CIR via [facility]. PICC line removed, line in tact. VSS. All belongings sent with pt. Report give to Santiago Glad at [facility].

## 2021-04-03 NOTE — Progress Notes (Signed)
Inpatient Rehabilitation Admissions Coordinator  CIR bed is available to admit patient to today at [facility] with Dr. Dagoberto Ligas receiving MD. I spoke with patient and her daughter by phone and they are aware and in agreement. I will make the arrangements to admit with transportation of [facility] to pick up at 1130.  Danne Baxter, RN, MSN Rehab Admissions Coordinator [phone] 04/03/2021 10:10 AM

## 2021-04-03 NOTE — Progress Notes (Signed)
Courtney Heys, MD  Physician  Physical Medicine and Rehabilitation  PMR Pre-admission     Signed  Date of Service:  03/30/2021 10:05 AM      Related encounter: ED to Hosp-Admission (Discharged) from 02/21/2021 in [place] (1C)       Signed          Show:Clear all _0 Manual_1 Template_2 Copied  Added by: _3 Cristina Gong, RN_4 Courtney Heys, MD   _5 Hover for details  PMR Admission Coordinator Pre-Admission Assessment   Patient: Desiree Madden is an 70 y.o., female MRN: [id number] DOB: 07-Aug-1951 Height: 5' 4.02" (162.6 cm) Weight: 64 kg   Insurance Information HMO: yes    PPO:      PCP:      IPA:      80/20:      OTHER:  PRIMARY: [employer]      Policy#: [id number]      Subscriber: pt CM Name: Shirlean Mylar      Phone#: [phone] ext [phone]     Fax#: [phone] Pre-Cert#: [id number] approved for 7 days with f/u Edwena Felty ext [phone] same fax     Employer:  Benefits:  Phone #: [phone]     Name: 4/22 Eff. Date: 12/10/2019     Deduct: none      Out of Pocket Max: $3900      Life Max: none CIR: $295 co pay per day days 1 until 6      SNF: no copay days 1 until 20; $188 co pay per day days 21 until 41; no copay days 22 until 100 Outpatient: $10 to $20 per visit     Co-Pay: visits per medical neccesity Home Health: 100%      Co-Pay: visits per medical neccesity DME: 80%     Co-Pay: 20% Providers: in network  SECONDARY: none      Policy#:      Phone#:    Development worker, community:       Phone#:    The Engineer, petroleum" for patients in Inpatient Rehabilitation Facilities with attached "Privacy Act Platter Records" was provided and verbally reviewed with: Patient and Family   Emergency Contact Information         Contact Information     Name Relation Home Work Mobile    brunquell,jessica Daughter     [phone]    Esaw Grandchild     [phone]         Current Medical History  Patient  Admitting Diagnosis: Debility   History of Present Illness: 70 year old right-handed female with history of hypothyroidism, CAD with CABG 2015 hypertension, hyperlipidemia as well as tobacco use, recurrent UTIs, colovesicular fistula  and diverticulitis followed by gastroenterology services Dr Earlie Counts.  Presented 02/21/2021 to [facility] with lower abdominal pain of acute onset as well as bouts of vomiting.  CT on admission suspicious for acute diverticulitis based on pericolonic fat stranding seen about the proximal sigmoid colon in the region of multiple colonic diverticula.  Large volume stool burden also seen.  Patient was seen by her primary gastroenterologist Dr.Feiler most recently November 2021 and notes the patient had series of UTIs ultimately found to have cola vascular fistula as a consequence of previous episodes of diverticulitis.  She had been seen by colorectal surgery recommended laparoscopic possible open sigmoid resection with left ureteric stent that was to be completed in October or November but incomplete procedure due to poor prep both times.  Patient underwent placement of image guided drain left lower quadrant/pigtail 02/28/2021  per interventional radiology.  Follow-up x-rays and imaging revealed massive edematous and chronically scarred sigmoid colon extending from mid descending colon with redundancy into the right pelvis and gangrenous changes.  Underwent sigmoid colectomy with end colostomy, Hartman's procedure, splenic flexure mobilization 03/01/2021 per Dr. Sheilah Mins surgery as well as cystorrhaphy same day per Dr. Diamantina Providence of urology services.  Patient did remain intubated noting septic shock requiring multiple vasopressors followed by critical care.  Maintained on broad-spectrum antibiotics.  Due to prolonged intubation underwent tracheostomy 03/08/2021 per Dr. Clyde Canterbury with a #6 Shiley tube placed.  On 03/09/2021 due to redundant viable access: With resultant colostomy dysfunction  underwent colostomy revision 03/09/2021.  A wound VAC remained in place.  Follow-up critical care medicine regards to acute on chronic respiratory failure tolerating room air since 03/27/2021 awaiting plan for decannulation.  Cardiology follow-up for history of CAD CABG developing SVT nonsustained requiring amiodarone as well as Eliquis.  In regards to patient's acute urinary retention status postrepair colovesicular fistula cystogram was completed no evidence of leak.  Foley tube removed but replaced 03/30/2021 due to ongoing retention requiring in and out catheterizations.  Her diet has been advanced to dysphagia #1 nectar thick liquid.  Hospital course anemia 9.0 and monitored.  Patient with routine skin checks foam dressing to buttocks change every 3 days as needed soiling.   Patient's medical record from [facility] has been reviewed by the rehabilitation admission coordinator and physician.   Past Medical History      Past Medical History:  Diagnosis Date  . Hypertension    . Thyroid disease        Family History   family history includes Arthritis in an other family member; CAD in her mother.   Prior Rehab/Hospitalizations Has the patient had prior rehab or hospitalizations prior to admission? Yes   Has the patient had major surgery during 100 days prior to admission? Yes              Current Medications   Current Facility-Administered Medications:  .  0.9 %  sodium chloride infusion, 250 mL, Intravenous, Continuous, Jennye Boroughs, MD, Stopped at 03/20/21 1026 .  ALPRAZolam Duanne Moron) tablet 0.25 mg, 0.25 mg, Oral, BID PRN, Jennye Boroughs, MD, 0.25 mg at 04/02/21 8185 .  amiodarone (PACERONE) tablet 200 mg, 200 mg, Oral, BID, Jennye Boroughs, MD, 200 mg at 04/02/21 2034 .  apixaban (ELIQUIS) tablet 5 mg, 5 mg, Oral, BID, Jennye Boroughs, MD, 5 mg at 04/02/21 2034 .  ascorbic acid (VITAMIN C) tablet 500 mg, 500 mg, Oral, BID, Jennye Boroughs, MD, 500 mg at 04/02/21 2034 .  chlorhexidine gluconate  (MEDLINE KIT) (PERIDEX) 0.12 % solution 15 mL, 15 mL, Mouth Rinse, BID, Jennye Boroughs, MD, 15 mL at 04/02/21 2035 .  Chlorhexidine Gluconate Cloth 2 % PADS 6 each, 6 each, Topical, Daily, Jennye Boroughs, MD, 6 each at 04/03/21 0550 .  docusate (COLACE) 50 MG/5ML liquid 100 mg, 100 mg, Oral, BID, Jennye Boroughs, MD, 100 mg at 03/31/21 2208 .  dronabinol (MARINOL) capsule 2.5 mg, 2.5 mg, Oral, BID AC, Nicole Kindred A, DO, 2.5 mg at 04/02/21 1647 .  feeding supplement (NEPRO CARB STEADY) liquid 237 mL, 237 mL, Oral, TID BM, Jennye Boroughs, MD, Last Rate: 0 mL/hr at 04/01/21 0426, 237 mL at 04/02/21 2033 .  HYDROmorphone (DILAUDID) injection 0.5 mg, 0.5 mg, Intravenous, Q3H PRN, Jennye Boroughs, MD, 0.5 mg at 04/02/21 2033 .  insulin aspart (novoLOG) injection 0-9 Units, 0-9 Units, Subcutaneous, Daily, Nicole Kindred  A, DO, 1 Units at 03/31/21 1627 .  ipratropium-albuterol (DUONEB) 0.5-2.5 (3) MG/3ML nebulizer solution 3 mL, 3 mL, Nebulization, Q4H PRN, Jennye Boroughs, MD .  levothyroxine (SYNTHROID) tablet 75 mcg, 75 mcg, Oral, Q0600, Jennye Boroughs, MD, 75 mcg at 04/03/21 0541 .  lip balm (BLISTEX) ointment, , Topical, PRN, Jennye Boroughs, MD, Given at 03/18/21 [phone] .  MEDLINE mouth rinse, 15 mL, Mouth Rinse, BID, Nicole Kindred A, DO, 15 mL at 04/02/21 2035 .  metoprolol succinate (TOPROL-XL) 24 hr tablet 12.5 mg, 12.5 mg, Oral, Daily, Jennye Boroughs, MD, 12.5 mg at 04/02/21 3716 .  mirtazapine (REMERON) tablet 15 mg, 15 mg, Oral, QHS, Nicole Kindred A, DO, 15 mg at 04/02/21 2033 .  multivitamin with minerals tablet 1 tablet, 1 tablet, Oral, Daily, Jennye Boroughs, MD, 1 tablet at 04/02/21 [phone] .  ondansetron (ZOFRAN) tablet 4 mg, 4 mg, Oral, Q6H PRN **OR** ondansetron (ZOFRAN) injection 4 mg, 4 mg, Intravenous, Q6H PRN, Jennye Boroughs, MD .  oxyCODONE (Oxy IR/ROXICODONE) immediate release tablet 5 mg, 5 mg, Oral, Q6H PRN, Jennye Boroughs, MD, 5 mg at 04/02/21 1243 .  pantoprazole (PROTONIX) EC  tablet 40 mg, 40 mg, Oral, Daily, Jennye Boroughs, MD, 40 mg at 04/02/21 9381 .  polyethylene glycol (MIRALAX / GLYCOLAX) packet 17 g, 17 g, Oral, Daily, Jennye Boroughs, MD, 17 g at 04/02/21 0811 .  sodium chloride flush (NS) 0.9 % injection 10-40 mL, 10-40 mL, Intracatheter, Q12H, Jennye Boroughs, MD, 10 mL at 04/02/21 2036 .  sodium chloride flush (NS) 0.9 % injection 10-40 mL, 10-40 mL, Intracatheter, PRN, Jennye Boroughs, MD   Patients Current Diet:  Diet Order                      DIET - DYS 1 Room service appropriate? Yes with Assist; Fluid consistency: Nectar Thick  Diet effective now                      Precautions / Restrictions Precautions Precautions: Fall Precaution Comments: wound vac/trach/PMV Restrictions Weight Bearing Restrictions: No    Has the patient had 2 or more falls or a fall with injury in the past year? No   Prior Activity Level Community (5-7x/wk): independent, driving and working in Leisure centre manager   Prior Functional Level Self Care: Did the patient need help bathing, dressing, using the toilet or eating? Independent   Indoor Mobility: Did the patient need assistance with walking from room to room (with or without device)? Independent   Stairs: Did the patient need assistance with internal or external stairs (with or without device)? Independent   Functional Cognition: Did the patient need help planning regular tasks such as shopping or remembering to take medications? Independent   Home Assistive Devices / Equipment Home Assistive Devices/Equipment: None Home Equipment: None   Prior Device Use: Indicate devices/aids used by the patient prior to current illness, exacerbation or injury? None of the above   Current Functional Level Cognition   Overall Cognitive Status: No family/caregiver present to determine baseline cognitive functioning Difficult to assess due to: Tracheostomy Orientation Level: Oriented X4 Following Commands: Follows one step  commands with increased time,Follows one step commands consistently,Follows multi-step commands inconsistently Safety/Judgement: Decreased awareness of safety,Decreased awareness of deficits General Comments: Pt alert and oriented to self and situation. Demonstrates some anxiety with performing sit>stand transfers, requesting assistance from OT before attempting, however overall motivated to participate in standing ADLs. Able to follow 1-step commands consistently. Requires  MIN verbal cues to follow 2-step commands    Extremity Assessment (includes Sensation/Coordination)   Upper Extremity Assessment: Generalized weakness  Lower Extremity Assessment: Generalized weakness,LLE deficits/detail LLE Deficits / Details: noted limited dorsiflexion, CNA notified and encouraged to stretch and pass on in report.     ADLs   Overall ADL's : Needs assistance/impaired Grooming: Oral care,Brushing hair,Set up,Min guard,Standing Grooming Details (indicate cue type and reason): MIN GUARD for standing balance. Upper Body Bathing: Moderate assistance,Standing Upper Body Bathing Details (indicate cue type and reason): MOD A for washing abdomen/trunk. Pt able to wash UE with MIN GUARD for standing balance Lower Body Bathing: Minimal assistance,Sit to/from stand Lower Body Bathing Details (indicate cue type and reason): MIN A to wash back of thighs when standing Upper Body Dressing : Set up,Min guard,Sitting Upper Body Dressing Details (indicate cue type and reason): MIN GUARD to don/doff hospital gown while sitting EOB Lower Body Dressing: Sitting/lateral leans,Moderate assistance Lower Body Dressing Details (indicate cue type and reason): MIN GUARD to don/doff socks while seated EOB. MAX A to don/doff L LE AFO Functional mobility during ADLs: Minimal assistance,Rolling walker (MIN A to walk ~10 ft with RW, x2 times) General ADL Comments: min guard for sitting balance for pt to wash hair while seated on EOB. Pt  performs oral care with set up A and min cuing for thoroughness.     Mobility   Overal bed mobility: Needs Assistance Bed Mobility: Supine to Sit Rolling: Min assist,Mod assist Sidelying to sit: Mod assist Supine to sit: Min assist Sit to supine: Mod assist Sit to sidelying: Max assist General bed mobility comments: Not assessed, in recliner upon arrival     Transfers   Overall transfer level: Needs assistance Equipment used: Rolling walker (2 wheeled) Transfers: Sit to/from Stand Sit to Stand: Mod assist Stand pivot transfers: Max assist General transfer comment: x4 bouts performed. Required MOD A for upward momentum and verbal cues for safe hand placement during each bout     Ambulation / Gait / Stairs / Wheelchair Mobility   Ambulation/Gait Ambulation/Gait assistance: Min assist,Mod assist Gait Distance (Feet): 14 Feet (1+ 76f, + 879f Assistive device: Rolling walker (2 wheeled) Gait Pattern/deviations: Step-to pattern General Gait Details: ambulated [id number] chair, additional 5ft63fd then additional 8ft 679ft velocity: decreased Stairs:  (deferred secondary to abdominal discomfort)     Posture / Balance Dynamic Sitting Balance Sitting balance - Comments: Pt able to sit EOB during UB/LB dressing with MIN GUARD Balance Overall balance assessment: Needs assistance Sitting-balance support: Feet supported,No upper extremity supported Sitting balance-Leahy Scale: Fair Sitting balance - Comments: Pt able to sit EOB during UB/LB dressing with MIN GUARD Standing balance support: Bilateral upper extremity supported,During functional activity Standing balance-Leahy Scale: Poor Standing balance comment: Requires BUE support from RW/sink to maintain balance during standing ADLs     Special needs/care consideration New trach this admit # 6 cuffed and using PMSV New colostomy this admit Wound VAC to surgical site McNichol, LauriRudi Heap Registered Nurse  WOC  Consult Note       Signed   Date of Service:  04/02/2021 12:07 PM                 Signed            Show:Clear all _0 ?Manual_1 ?Template_2 ?Copied   Added by: _3 ?McNichol, LauriRudi Heap    _4 ?Hover for details   WOC NRoee ostomy follow up Stoma type/location: LMQ colosotmy Stomal assessment/size: 1 and  3/8 inches round, raised, red, above skin level.  Peristomal assessment: Creasing at 9 o'clock and beneath stoma Treatment options for stomal/peristomal skin: skin barrier ring segments used to fill creased and establish a flat plane for pouching Output: brown stool balls, none in pouch Ostomy pouching: 2pc. 2 and 3/4 inch pouching system with aforementioned skin barrier rings Education provided: None Enrolled patient in [employer] Discharge program: Yes, previously   [place] Nurse wound follow up Wound type:Routine NPWT dressing change. Measurement:Two wounds separated by a thin skin bridge. One staple visible. Proximal wound measures 4.5cm x 2cm x 0.3cm. Distal wound measures 7cm x 3.2cm x 0.4cm  Wound bed: Red, moist. Drainage (amount, consistency, odor) Small serosanguinous Periwound: Some maceration in the periwound areas.  These are covered with skin barrier ring segments prior to application of new NPWT device Dressing procedure/placement/frequency: NPWT dressing removed, 1 piece of black foam.  Wound cleansed with NS, and patted dry. Periwound skin is protected with skin barrier ring segments.  Two pieces of black foam used to obliterate dead space, these touch over skin barrier ring in center.  Sponges are covered with drape.  Drape is attached to 146mHg continuous negative pressure and an immediate seal is achieved. The dressing is labeled and dated.  The ostomy is then pouched.   [place] will follow, and will remain available to this patient, the nursing and medical teams.   Supply in room for Wednesday.   Thanks, LMaudie Flakes MSN, RN, GBenton CArther Abbott Pager# [phone]                   Note Details   Author MIllene Regulus RN File Time 04/02/2021 12:15 PM  Author Type Registered Nurse Status Signed  Last Editor MIllene Regulus RN Service [facility]# [id number] Date 02/21/2021         Previous Home Environment  Living Arrangements: Spouse/significant other (daughter lives in in lMuseum/gallery curator  Lives With: Spouse,Daughter Available Help at Discharge: Family,Available 24 hours/day (daughter works remote from home) [place] of [place] Home Access: Stairs to enter ETechnical brewerof Steps: 5 Bathroom Shower/Tub: TOptometrist Yes How Accessible: Accessible via wMoro No   Discharge Living Setting Plans for Discharge Living Setting: Patient's home,Lives with (comment) Type of Home at Discharge: [place] One level Discharge Home Access: Stairs to enter Entrance Stairs-Rails: RWetonkareach both Entrance Stairs-Number of Steps: 5 Discharge Bathroom Shower/Tub: TGreenwoodunit Discharge Bathroom Toilet: Standard Discharge Bathroom Accessibility: Yes How Accessible: Accessible via walker Does the patient have any problems obtaining your medications?: No   Social/Family/Support Systems Contact Information: daughter, JJanett BillowAnticipated Caregiver: daughter and spouse, AMitzi HansenAnticipated CAmbulance personInformation: see above Caregiver Availability: 24/7 Discharge Plan Discussed with Primary Caregiver: Yes Is Caregiver In Agreement with Plan?: Yes Does Caregiver/Family have Issues with Lodging/Transportation while Pt is in Rehab?: No   Goals Patient/Family Goal for Rehab: supervision to min assist with PT, OT and SLP Expected length of stay: ELOS 2 weeks Pt/Family Agrees to Admission and willing to participate: Yes Program Orientation Provided & Reviewed with  Pt/Caregiver Including Roles  & Responsibilities: Yes   Decrease burden of Care through IP rehab admission: n/a   Possible need for SNF placement upon discharge: not anticipated   Patient Condition: [facility] spoken with CM, and patient  and daughter. I met with patient at the bedside for inpatient rehabilitation assessment.  Patient will benefit from ongoing PT, OT and SLP, can actively participate in 3 hours of therapy a day 5 days of the week, and can make measurable gains during the admission.  Patient will also benefit from the coordinated team approach during an Inpatient Acute Rehabilitation admission.  The patient will receive intensive therapy as well as Rehabilitation physician, nursing, social worker, and care management interventions.  Due to bladder management, bowel management, safety, skin/wound care, disease management, medication administration, pain management and patient education the patient requires 24 hour a day rehabilitation nursing.  The patient is currently overall Mod assist with mobility and basic ADLs.  Discharge setting and therapy post discharge at home with home health is anticipated.  Patient has agreed to participate in the Acute Inpatient Rehabilitation Program and will admit today.   Preadmission Screen Completed By:  Cleatrice Burke, 04/03/2021 10:04 AM ______________________________________________________________________   Discussed status with Dr. Dagoberto Ligas on 04/03/2021 at 36 and received approval for admission today.   Admission Coordinator:  Cleatrice Burke, RN, time  1005 Date 04/03/2021    Assessment/Plan: Diagnosis: 1. Does the need for close, 24 hr/day Medical supervision in concert with the patient's rehab needs make it unreasonable for this patient to be served in a less intensive setting? Yes 2. Co-Morbidities requiring supervision/potential complications: colostomy, dysphagia, D1/nectar thick diet; Afib,  SVT, CHF, malnutrition; trach, and debility 3. Due to bladder management, bowel management, safety, skin/wound care, disease management, medication administration, pain management and patient education, does the patient require 24 hr/day rehab nursing? Yes 4. Does the patient require coordinated care of a physician, rehab nurse, PT, OT, and SLP to address physical and functional deficits in the context of the above medical diagnosis(es)? Yes Addressing deficits in the following areas: balance, endurance, locomotion, strength, transferring, bowel/bladder control, bathing, dressing, feeding, grooming, toileting and swallowing 5. Can the patient actively participate in an intensive therapy program of at least 3 hrs of therapy 5 days a week? Yes 6. The potential for patient to make measurable gains while on inpatient rehab is good and fair 7. Anticipated functional outcomes upon discharge from inpatient rehab: supervision and min assist PT, supervision and min assist OT, supervision and min assist SLP 8. Estimated rehab length of stay to reach the above functional goals is: 2 weeks or so 9. Anticipated discharge destination: Home 10. Overall Rehab/Functional Prognosis: good     MD Signature:            Revision History                                                 Note Details  Jan Fireman, MD File Time 04/03/2021 10:42 AM  Author Type Physician Status Signed  Last Editor Courtney Heys, MD Service Physical Medicine and [place] # [id number] Admit Date 04/03/2021

## 2021-04-03 NOTE — IPOC Note (Signed)
Individualized overall Plan of Care (IPOC) Patient Details Name: Desiree Madden MRN: [id number] DOB: Jun 11, 1951  Admitting Diagnosis: Intensive care (ICU) myopathy  Hospital Problems: Principal Problem:   Intensive care (ICU) myopathy Active Problems:   Protein-calorie malnutrition, severe   Physical debility   Left foot drop   Urinary retention   Hypokalemia   Tracheostomy dependent (HCC)   PAF (paroxysmal atrial fibrillation) ([place])   Dysphagia     Functional Problem List: Nursing Medication Management,Safety,Bladder,Bowel,Edema,Endurance,Pain,Nutrition  PT Balance,Nutrition,Motor,Endurance,Pain,Safety,Skin Integrity  OT Balance,Cognition,Endurance,Motor  SLP    TR         Basic ADL's: OT Grooming,Bathing,Dressing,Toileting     Advanced  ADL's: OT       Transfers: PT Bed Mobility,Bed to Chair,Car  OT Toilet,Tub/Shower     Locomotion: PT Ambulation,Stairs     Additional Impairments: OT None  SLP Swallowing      TR      Anticipated Outcomes Item Anticipated Outcome  Self Feeding independent  Swallowing  Mod I   Basic self-care  supervision  Toileting  supervision   Bathroom Transfers supervision  Bowel/Bladder  Manage bowel with min assist and bladder with mod I assist  Transfers  Supervision with LRAD  Locomotion  Supervision with LRAD  Communication     Cognition     Pain  pain at or below level 4  Safety/Judgment  maintain safety wtih cues/reminders   Therapy Plan: PT Intensity: Minimum of 1-2 x/day ,45 to 90 minutes PT Frequency: 5 out of 7 days PT Duration Estimated Length of Stay: 2.5 - 3 weeks OT Intensity: Minimum of 1-2 x/day, 45 to 90 minutes OT Frequency: 5 out of 7 days OT Duration/Estimated Length of Stay: 21-22 days SLP Intensity: Minumum of 1-2 x/day, 30 to 90 minutes SLP Frequency: 3 to 5 out of 7 days SLP Duration/Estimated Length of Stay: 3 weeks    Team Interventions: Nursing Interventions Patient/Family  Education,Bowel Management,Pain Management,Skin Care/Wound Management,Dysphagia/Aspiration Precaution Training,Discharge Planning,Medication Management,Disease Management/Prevention,Bladder Management  PT interventions Ambulation/gait training,Balance/vestibular training,Cognitive remediation/compensation,Community reintegration,Discharge planning,Disease IT sales professional stimulation,Functional mobility training,Neuromuscular re-education,Pain management,Patient/family education,Psychosocial support,Skin care/wound management,Splinting/orthotics,Stair training,Therapeutic Activities,Therapeutic Exercise,UE/LE Strength taining/ROM,UE/LE Coordination activities,Visual/perceptual remediation/compensation,Wheelchair propulsion/positioning  OT Interventions Balance/vestibular training,Cognitive remediation/compensation,Discharge planning,DME/adaptive equipment instruction,Functional mobility training,Neuromuscular re-education,Patient/family education,Psychosocial support,Self Care/advanced ADL retraining,Therapeutic Exercise,UE/LE Strength taining/ROM,UE/LE Coordination activities,Therapeutic Activities  SLP Interventions Dysphagia/aspiration precaution training,Therapeutic Activities,Environmental controls,Cueing hierarchy,Functional tasks,Patient/family education  TR Interventions    SW/CM Interventions Discharge Planning,Psychosocial Support,Patient/Family Education   Barriers to Discharge MD  Medical stability, Wound care, and ostomy  Nursing Other (comments) (foley) 1 level 5 step entry with dtr and son in law  PT Home environment access/layout,Insurance for SNF coverage,Medication compliance,Lack of/limited family support,Trach,New oxygen    OT      SLP Wound Care,Trach    SW       Team Discharge Planning: Destination: PT-Home ,OT- Home , SLP-Home Projected Follow-up: PT-Home health PT, OT-  Home health OT, SLP-24 hour  supervision/assistance,Home Health SLP Projected Equipment Needs: PT-To be determined, OT- 3 in 1 bedside comode, SLP-To be determined Equipment Details: PT-Pt reports she owns a RW?, OT-  Patient/family involved in discharge planning: PT- Patient,  OT-Patient, SLP-Patient  MD ELOS: 15-18 days. Medical Rehab Prognosis:  Good Assessment: 70 year old female with history of HTN, ulcerative colitis, colovesical fistula, T11 compression fracture, diveticulits who was admitted on 02/21/21 with severe abdominal pain concerning for acute diverticulitis and large stool burden seen in colon as well as moderately progressive severe T11 compression Fx. She was started  on antibiotics for UTI as well as diverticulitis. GI  recommended supportive care and augmentation of bowel regimen.  She hs progressive leucocytosis and repeat CT abdomen 3/19 showing worsening of paracolic stranding with substantial localized wall thickening along 9 cm suspicious for progressive diverticulitis and regional colitis. Antibiotics switched to Zosyn with improvement in WBC. Bowel prep ordered with consideration of gastrografin enema for workup per Dr. Christian Mate (General Surgery). She developed fever with recurrent rise in WBC and CT abdomen showed abscess with question of fistula formation. She underwent CT guided drainage of 100 cc fluid from LLQ abscess on 03/23 but continued to have rise in WBC with ongoing pain. She was taken to OR on 03/24 for Sigmoid colectomy with end colostomy, Hartman's procedure by Dr. Christian Mate and Cystorrhapy by Dr. Diamantina Providence.    Post op course significant for septic shock requiring prolonged intubation, MODS,  NSTEMI, severe anasarca and severe agitation due to delirium. Dr. Clayborn Bigness recommended conservative care with IV heparin once cleared by surgery. 2D echo showed EF 45-50% and Amiodarone added for episodes of wide complex tachycardia with PAF. She was transitioned to Eliquis.   She underwent tracheostomy by  Dr. Richardson Landry on 03/31 and as mentation improved, tolerated extubation to ATC by 04/09.  She was started on dysphagia 1, nectar liquids dud to dysphagia. Foley placed on 04/08 due to urinary retention with follow up cystogram in 2 weeks showing no evidence of leak. She continued to have problems voiding with retention and foley was replaced on 04/24.  Encephalopathy is resolving but continues to have bouts of lethargy and poor intake. She was noted to have moderate to severe depression therefore Remeron added in addition to low dose marinol to stimulate appetite.  Therapy ongoing to address weakness due to debility as well as dysphagia. She is tolerating PMSV, is showing improvement in activity tolerance but noted to have cognitive deficits as well as anxiety with activity. Will set goals for supervision with PT/OT/SLP.     Due to the current state of emergency, patients may not be receiving their 3-hours of Medicare-mandated therapy.  See Team Conference Notes for weekly updates to the plan of care

## 2021-04-04 DIAGNOSIS — E876 Hypokalemia: Secondary | ICD-10-CM

## 2021-04-04 DIAGNOSIS — M21372 Foot drop, left foot: Secondary | ICD-10-CM

## 2021-04-04 DIAGNOSIS — Z930 Tracheostomy status: Secondary | ICD-10-CM

## 2021-04-04 DIAGNOSIS — R1310 Dysphagia, unspecified: Secondary | ICD-10-CM

## 2021-04-04 DIAGNOSIS — I480 Paroxysmal atrial fibrillation: Secondary | ICD-10-CM

## 2021-04-04 DIAGNOSIS — R339 Retention of urine, unspecified: Secondary | ICD-10-CM

## 2021-04-04 LAB — CBC WITH DIFFERENTIAL/PLATELET
Abs Immature Granulocytes: 0.04 10*3/uL (ref 0.00–0.07)
Basophils Absolute: 0.1 10*3/uL (ref 0.0–0.1)
Basophils Relative: 1 %
Eosinophils Absolute: 0.1 10*3/uL (ref 0.0–0.5)
Eosinophils Relative: 1 %
HCT: 25.9 % — ABNORMAL LOW (ref 36.0–46.0)
Hemoglobin: 8.4 g/dL — ABNORMAL LOW (ref 12.0–15.0)
Immature Granulocytes: 0 %
Lymphocytes Relative: 25 %
Lymphs Abs: 2.4 10*3/uL (ref 0.7–4.0)
MCH: 30.5 pg (ref 26.0–34.0)
MCHC: 32.4 g/dL (ref 30.0–36.0)
MCV: 94.2 fL (ref 80.0–100.0)
Monocytes Absolute: 0.8 10*3/uL (ref 0.1–1.0)
Monocytes Relative: 9 %
Neutro Abs: 5.9 10*3/uL (ref 1.7–7.7)
Neutrophils Relative %: 64 %
Platelets: 256 10*3/uL (ref 150–400)
RBC: 2.75 MIL/uL — ABNORMAL LOW (ref 3.87–5.11)
RDW: 14.5 % (ref 11.5–15.5)
WBC: 9.3 10*3/uL (ref 4.0–10.5)
nRBC: 0 % (ref 0.0–0.2)

## 2021-04-04 LAB — COMPREHENSIVE METABOLIC PANEL
ALT: 22 U/L (ref 0–44)
AST: 27 U/L (ref 15–41)
Albumin: 2.3 g/dL — ABNORMAL LOW (ref 3.5–5.0)
Alkaline Phosphatase: 68 U/L (ref 38–126)
Anion gap: 9 (ref 5–15)
BUN: 25 mg/dL — ABNORMAL HIGH (ref 8–23)
CO2: 27 mmol/L (ref 22–32)
Calcium: 8.6 mg/dL — ABNORMAL LOW (ref 8.9–10.3)
Chloride: 98 mmol/L (ref 98–111)
Creatinine, Ser: 1.06 mg/dL — ABNORMAL HIGH (ref 0.44–1.00)
GFR, Estimated: 57 mL/min — ABNORMAL LOW (ref >60)
Glucose, Bld: 104 mg/dL — ABNORMAL HIGH (ref 70–99)
Potassium: 3.9 mmol/L (ref 3.5–5.1)
Sodium: 134 mmol/L — ABNORMAL LOW (ref 135–145)
Total Bilirubin: 0.5 mg/dL (ref 0.3–1.2)
Total Protein: 6 g/dL — ABNORMAL LOW (ref 6.5–8.1)

## 2021-04-04 LAB — GLUCOSE, CAPILLARY
Glucose-Capillary: 112 mg/dL — ABNORMAL HIGH (ref 70–99)
Glucose-Capillary: 119 mg/dL — ABNORMAL HIGH (ref 70–99)
Glucose-Capillary: 143 mg/dL — ABNORMAL HIGH (ref 70–99)
Glucose-Capillary: 94 mg/dL (ref 70–99)
Glucose-Capillary: 99 mg/dL (ref 70–99)

## 2021-04-04 MED ORDER — POLYETHYLENE GLYCOL 3350 17 G PO PACK
17.0000 g | PACK | Freq: Every day | ORAL | Status: DC
  Administered 2021-04-04: 17 g via ORAL

## 2021-04-04 MED ORDER — DOCUSATE SODIUM 50 MG/5ML PO LIQD
200.0000 mg | Freq: Two times a day (BID) | ORAL | Status: DC
  Administered 2021-04-04 – 2021-04-06 (×4): 200 mg via ORAL
  Filled 2021-04-04 (×3): qty 20

## 2021-04-04 MED ORDER — ATORVASTATIN CALCIUM 10 MG PO TABS
20.0000 mg | ORAL_TABLET | Freq: Every day | ORAL | Status: DC
  Administered 2021-04-04 – 2021-04-16 (×13): 20 mg via ORAL
  Filled 2021-04-04 (×14): qty 2

## 2021-04-04 NOTE — Progress Notes (Signed)
Patient information reviewed and entered into [facility] System by Becky Carren Blakley, PPS coordinator. Information including medical coding, function ability, and quality indicators will be reviewed and updated through discharge.   

## 2021-04-04 NOTE — Evaluation (Signed)
Speech Language Pathology Bedside Swallow Evaluation and PMSV Evaluation  Patient Details  Name: Desiree Madden MRN: [id number] Date of Birth: 12-Oct-1951  SLP Diagnosis: Dysphagia  Rehab Potential: Excellent ELOS: 3 weeks    Today's Date: 04/04/2021 SLP Individual Time: 0930-1000 SLP Individual Time Calculation (min): 30 min and Today's Date: 04/04/2021 SLP Missed Time: 30 Minutes Missed Time Reason:  (ostomy care)   Hospital Problem: Principal Problem:   Intensive care (ICU) myopathy Active Problems:   Protein-calorie malnutrition, severe   Physical debility   Left foot drop   Urinary retention   Hypokalemia   Tracheostomy dependent (HCC)   PAF (paroxysmal atrial fibrillation) ([place])   Dysphagia  Past Medical History:  Past Medical History:  Diagnosis Date  . Hypertension   . Thyroid disease    Past Surgical History:  Past Surgical History:  Procedure Laterality Date  . CARDIAC SURGERY    . COLECTOMY WITH COLOSTOMY CREATION/HARTMANN PROCEDURE Left 03/01/2021   Procedure: COLECTOMY WITH COLOSTOMY CREATION/HARTMANN PROCEDURE;  Surgeon: Ronny Bacon, MD;  Location: [facility] ORS;  Service: General;  Laterality: Left;  . COLOSTOMY REVISION N/A 03/09/2021   Procedure: COLOSTOMY REVISION;  Surgeon: Ronny Bacon, MD;  Location: [facility] ORS;  Service: General;  Laterality: N/A;  . TRACHEOSTOMY TUBE PLACEMENT N/A 03/08/2021   Procedure: TRACHEOSTOMY;  Surgeon: Clyde Canterbury, MD;  Location: [facility] ORS;  Service: ENT;  Laterality: N/A;    Assessment / Plan / Recommendation Clinical Impression Patient is a 70 year old right-handed female with history of hypothyroidism,CAD with CABG 2015 hypertension, hyperlipidemia as well as tobacco use, recurrent UTIs, colovesicular fistula and diverticulitis followed by gastroenterology services Dr Earlie Counts. Presented [facility]/21/[facility]21 to [facility] with lower abdominal pain of acute onset as well as bouts of vomiting. CT on admission suspicious for acute  diverticulitis. Large volume stool burden also seen.  Patient underwent placement of image guided drain left lower quadrant/pigtail 02/28/2021 per interventional radiology. Follow-up x-rays and imaging revealed massive edematous and chronically scarred sigmoid colon extending from mid descending colon with redundancy into the right pelvis and gangrenous changes. Underwent sigmoid colectomy with end colostomy, Hartman's procedure, splenic flexure mobilization 03/01/2021 per Dr. Sheilah Mins surgery as well ascystorrhaphysame day per Dr. Wynona Neat urology services. Patient did remain intubated noting septic shock requiring multiple vasopressors followed by critical care. Due to prolonged intubation underwent tracheostomy 03/08/2021 per Dr. Clyde Canterbury. On 03/09/2021 due to redundant viable access: With resultant colostomy dysfunction underwent colostomy revision 03/09/2021. A wound VAC remained in place. Follow-up critical care medicine regards to acute on chronic respiratory failure tolerating room air since 03/27/2021 awaiting plan for decannulation. Foley tube removed but replaced 03/30/2021 due to ongoing retention requiring in and out catheterizations. CIR recommended and patient admitted 04/03/21.  Patient currently has a #6 cuffed trach and is wearing her PMSV during all waking hours. Patient's PMSV was on upon arrival and patient demonstrated appropriate upper airway patency. Patient able to demonstrate adequate phonation with normal vocal quality and intensity and was 100% intelligible at the conversation level. Patient with a mildly weak cough but was able to orally expectorate secretions intermittently throughout session.  Recommend patient wear her PMSV during all waking hours and with all PO intake. Also recommend physician may consider downsizing to a smaller, cuffless trach. Patient performed oral care via the suction toothbrush and consumed trials of ice chips and thin liquids via cup.  Patient demonstrated what appeared to be a swift swallow response without overt s/s of aspiration. Patient also demonstrated efficient mastication with solid textures and  primarily utilized her front teeth due to missing and poor condition of remaining dentition. Patient also reported she was essentially consuming a mechanical soft diet prior to admission due to issues with her current dentition. Recommend patient continue current diet of Dys. 1 textures with nectar-thick liquids with repeat MBS tomorrow to assess for possible diet upgrade. A formal cognitive assessment was not administered due to time constraints, however, mild deficits in awareness and short-term recall observed. Therefore, patient will need a cognitive assessment. Patient would benefit from skilled SLP intervention to maximize her swallowing function and overall safety with PMSV prior to discharge.    Skilled Therapeutic Interventions          Administered a BSE and PMSV evaluation, please see above for details.   SLP Assessment  Patient will need skilled Speech Lanaguage Pathology Services during CIR admission    Recommendations  Patient may use Passy-Muir Speech Valve: During all waking hours (remove during sleep) PMSV Supervision: Intermittent MD: Please consider changing trach tube to : Cuffless;Smaller size SLP Diet Recommendations: Dysphagia 1 (Puree);Nectar Liquid Administration via: Cup Medication Administration: Crushed with puree Supervision: Patient able to self feed;Full supervision/cueing for compensatory strategies Compensations: Minimize environmental distractions;Slow rate;Small sips/bites Postural Changes and/or Swallow Maneuvers: Seated upright 90 degrees Oral Care Recommendations: Oral care BID Recommendations for Other Services: Neuropsych consult Patient destination: Home Follow up Recommendations: 24 hour supervision/assistance;Home Health SLP Equipment Recommended: To be determined    SLP Frequency 3  to 5 out of 7 days   SLP Duration  SLP Intensity  SLP Treatment/Interventions 3 weeks  Minumum of 1-2 x/day, 30 to 90 minutes  Dysphagia/aspiration precaution training;Therapeutic Activities;Environmental controls;Cueing hierarchy;Functional tasks;Patient/family education    Pain No/Denies Pain   Prior Functioning Type of Home: House  Lives With: Spouse;Daughter Available Help at Discharge: Family;Available 24 hours/day Vocation: Retired  [place] [employer] Cognition Expression of Ideas and Wants Expression of Ideas and Wants: Some difficulty - exhibits some difficulty with expressing needs and ideas (e.g, some words or finishing thoughts) or speech is not clear   Understanding Verbal and Non-Verbal Content Understanding Verbal and Non-Verbal Content: Usually understands - understands most conversations, but misses some part/intent of message. Requires cues at times to understand   Memory/Recall Ability *first 3 days only Memory/Recall Ability *first 3 days only: That he or she is in a hospital/hospital unit     PMSV Assessment  PMSV Trial PMSV was placed for: PMSV on upon arrival and remained in place throughout session Able to redirect subglottic air through upper airway: Yes Able to Attain Phonation: Yes Voice Quality: Normal Able to Expectorate Secretions: Yes Level of Secretion Expectoration with PMSV: Oral Breath Support for Phonation: Adequate Intelligibility: Intelligible Word: 75-100% accurate Phrase: 75-100% accurate Sentence: 75-100% accurate Conversation: 75-100% accurate SpO2 During Trial: 96 % Pulse During Trial: 84 Behavior: Alert;Cooperative;Responsive to questions;Expresses self well  Bedside Swallowing Assessment General Date of Onset: 02/21/21 Previous Swallow Assessment: MBS 4/14: Recommended Dys. 1 textures with nectar-thick liquids Diet Prior to this Study: Dysphagia 1 (puree);Nectar-thick liquids Temperature Spikes Noted:  No Respiratory Status: Trach Trach Size and Type: #6;Cuff;Deflated;With PMSV in place History of Recent Intubation: Yes Length of Intubations (days): 14 days Date extubated: 03/08/21 Behavior/Cognition: Alert;Pleasant mood;Cooperative;Requires cueing Oral Cavity - Dentition: Poor condition;Missing dentition Self-Feeding Abilities: Able to feed self Vision: Functional for self-feeding Patient Positioning: Upright in bed Baseline Vocal Quality: Normal Volitional Cough: Weak Volitional Swallow: Able to elicit  Ice Chips Ice chips: Within functional  limits Presentation: Self Fed;Spoon Thin Liquid Thin Liquid: Within functional limits Presentation: Self Fed;Spoon;Cup Other Comments: Will need repeat MBS prior to upgrade Nectar Thick Nectar Thick Liquid: Within functional limits Presentation: Self Fed;Cup Honey Thick Honey Thick Liquid: Not tested Puree Puree: Within functional limits Presentation: Self Fed;Spoon Solid Solid: Impaired Presentation: Self Fed;Spoon Oral Phase Functional Implications: Impaired mastication Other Comments: Masticates all food with front teeth at baseline due to missing dentition, on a mechanical soft diet at baseline due to issues with dentition BSE Assessment Risk for Aspiration Impact on safety and function: Mild aspiration risk Other Related Risk Factors: Tracheostomy;Cognitive impairment;Deconditioning;Prolonged intubation;Decreased respiratory status  Short Term Goals: Week 1: SLP Short Term Goal 1 (Week 1): Patient will donn/doff PMSV with supervision verbal cues. SLP Short Term Goal 2 (Week 1): Patient will consume current diet with minimal overt s/s of aspiration with supervision verbal cues for useof swallowing compensatory strategies.  Refer to Care Plan for Long Term Goals  Recommendations for other services: Neuropsych  Discharge Criteria: Patient will be discharged from SLP if patient refuses treatment 3 consecutive times without  medical reason, if treatment goals not met, if there is a change in medical status, if patient makes no progress towards goals or if patient is discharged from hospital.  The above assessment, treatment plan, treatment alternatives and goals were discussed and mutually agreed upon: by patient  Desiree Madden 04/04/2021, 2:44 PM

## 2021-04-04 NOTE — Evaluation (Signed)
Occupational Therapy Assessment and Plan  Patient Details  Name: Desiree Madden MRN: [id number] Date of Birth: 08-14-1951  OT Diagnosis: cognitive deficits, muscular wasting and disuse atrophy and muscle weakness (generalized) Rehab Potential: Rehab Potential (ACUTE ONLY): Good ELOS: 21-22 days   Today's Date: 04/04/2021 OT Individual Time: 1000-1100 OT Individual Time Calculation (min): 60 min     Hospital Problem: Principal Problem:   Intensive care (ICU) myopathy Active Problems:   Protein-calorie malnutrition, severe   Physical debility   Left foot drop   Urinary retention   Hypokalemia   Tracheostomy dependent (HCC)   PAF (paroxysmal atrial fibrillation) ([place])   Dysphagia   Past Medical History:  Past Medical History:  Diagnosis Date  . Hypertension   . Thyroid disease    Past Surgical History:  Past Surgical History:  Procedure Laterality Date  . CARDIAC SURGERY    . COLECTOMY WITH COLOSTOMY CREATION/HARTMANN PROCEDURE Left 03/01/2021   Procedure: COLECTOMY WITH COLOSTOMY CREATION/HARTMANN PROCEDURE;  Surgeon: Ronny Bacon, MD;  Location: [facility] ORS;  Service: General;  Laterality: Left;  . COLOSTOMY REVISION N/A 03/09/2021   Procedure: COLOSTOMY REVISION;  Surgeon: Ronny Bacon, MD;  Location: [facility] ORS;  Service: General;  Laterality: N/A;  . TRACHEOSTOMY TUBE PLACEMENT N/A 03/08/2021   Procedure: TRACHEOSTOMY;  Surgeon: Clyde Canterbury, MD;  Location: [facility] ORS;  Service: ENT;  Laterality: N/A;    Assessment & Plan Clinical Impression: Desiree Madden is a 70 year old female with history of HTN, ulcerative colitis, colovesical fistula, T11 compression fracture, diveticulits who was admitted on 02/21/21 with severe abdominal pain concerning for acute diverticulitis and large stool burden seen in colon as well as moderately progressive severe T11 compression Fx. She was started on antibiotics for UTI as well as diverticulitis. GI  recommended supportive care and  augmentation of bowel regimen.  She hs progressive leucocytosis and repeat CT abdomen 3/19 showing worsening of paracolic stranding with substantial localized wall thickening along 9 cm suspicious for progressive diverticulitis and regional colitis. Antibiotics switched to Zosyn with improvement in WBC. Bowel prep ordered with consideration of gastrografin enema for workup per Dr. Christian Mate (General Surgery). She developed fever with recurrent rise in WBC and CT abdomen showed abscess with question of fistula formation.  She underwent CT guided drainage of 100 cc fluid from LLQ abscess on 03/23 but continued to have rise in WBC with ongoing pain. She was taken to OR on 03/24 for Sigmoid colectomy with end colostomy, Hartman's procedure by Dr. Christian Mate and Cystorrhapy by Dr. Diamantina Providence.    Post op course significant for septic shock requiring prolonged intubation, MODS,  NSTEMI, severe anasarca and severe agitation due to delirium. Dr. Clayborn Bigness recommended conservative care with IV heparin once cleared by surgery. 2D echo showed EF 45-50% and Amiodarone added for episodes of wide complex tachycardia with PAF. She was transitioned to Eliquis.   She underwent tracheostomy by Dr. Richardson Landry on 03/31 and as mentation improved, tolerated extubation to ATC by 04/09.  She was started on dysphagia 1, nectar liquids dud to dysphagia. Foley placed on 04/08 due to urinary retention with follow up cystogram in 2 weeks showing no evidence of leak. She continued to have problems voiding with retention and foley was replaced on 04/24.  Encephalopathy is resolving but continues to have bouts of lethargy and poor intake. She was noted to have moderate to severe depression therefore Remeron added in addition to low dose marinol to stimulate appetite.  Therapy ongoing to address weakness due to debility as  well as dysphagia. She is tolerating PMSV, is showing improvement in activity tolerance but noted to have cognitive deficits as well  as anxiety with activity. CIR recommended due to functional decline.    Patient transferred to CIR on 04/03/2021 .    Patient currently requires max with basic self-care skills secondary to muscle weakness, decreased cardiorespiratoy endurance and decreased oxygen support, decreased problem solving and decreased memory and decreased sitting balance, decreased standing balance and decreased balance strategies.  Prior to hospitalization, patient was fully independent and active.  Patient will benefit from skilled intervention to increase independence with basic self-care skills prior to discharge home with care partner.  Anticipate patient will require intermittent supervision and follow up home health.  OT - End of Session Activity Tolerance: Tolerates 10 - 20 min activity with multiple rests Endurance Deficit: Yes OT Assessment Rehab Potential (ACUTE ONLY): Good OT Patient demonstrates impairments in the following area(s): Balance;Cognition;Endurance;Motor OT Basic ADL's Functional Problem(s): Grooming;Bathing;Dressing;Toileting OT Transfers Functional Problem(s): Toilet;Tub/Shower OT Additional Impairment(s): None OT Plan OT Intensity: Minimum of 1-2 x/day, 45 to 90 minutes OT Frequency: 5 out of 7 days OT Duration/Estimated Length of Stay: 21-22 days OT Treatment/Interventions: Balance/vestibular training;Cognitive remediation/compensation;Discharge planning;DME/adaptive equipment instruction;Functional mobility training;Neuromuscular re-education;Patient/family education;Psychosocial support;Self Care/advanced ADL retraining;Therapeutic Exercise;UE/LE Strength taining/ROM;UE/LE Coordination activities;Therapeutic Activities OT Self Feeding Anticipated Outcome(s): independent OT Basic Self-Care Anticipated Outcome(s): supervision OT Toileting Anticipated Outcome(s): supervision OT Bathroom Transfers Anticipated Outcome(s): supervision OT Recommendation Patient destination: Home Follow Up  Recommendations: Home health OT Equipment Recommended: 3 in 1 bedside comode   OT Evaluation Precautions/Restrictions  Precautions Precautions: Fall Precaution Comments: wound vac/trach/PMV Restrictions Weight Bearing Restrictions: No    Vital Signs Therapy Vitals Pulse Rate: 79 Resp: 18 Patient Position (if appropriate): Lying Oxygen Therapy SpO2: 96 % O2 Device: Tracheostomy Collar O2 Flow Rate (L/min): 5 L/min FiO2 (%): 21 % Pain  no c/o pain  Home Living/Prior Functioning Home Living Living Arrangements: Spouse/significant other Available Help at Discharge: Family,Available 24 hours/day Type of Home: House Home Access: Stairs to enter Technical brewer of Steps: 5 Entrance Stairs-Rails: Right,Left Home Layout: One level Bathroom Shower/Tub: Engineer, petroleum: Standard  Lives With: Spouse,Daughter Prior Function Level of Independence: Independent with basic ADLs,Independent with homemaking with ambulation,Independent with gait  Able to Take Stairs?: Yes Driving: Yes Vocation: Retired Comments: Pt reports that she is active in the community and independent w/o AD.; pt reports that she loves to hike Vision Baseline Vision/History: Wears glasses Wears Glasses: At all times Patient Visual Report: No change from baseline Vision Assessment?: No apparent visual deficits Perception  Perception: Within Functional Limits Praxis Praxis: Intact Cognition Overall Cognitive Status: Impaired/Different from baseline Arousal/Alertness: Awake/alert Orientation Level: Person;Place;Situation Person: Oriented Place: Oriented Situation: Oriented Year: 2022 Month: April Day of Week: Correct Memory: Impaired Memory Impairment: Decreased recall of new information Immediate Memory Recall: Sock;Blue;Bed Memory Recall Sock: Without Cue Memory Recall Blue: Without Cue Memory Recall Bed: With Cue Attention: Sustained Sustained Attention: Impaired  (distracted by multiple lines and leads.) Awareness: Appears intact Awareness Impairment: Intellectual impairment Problem Solving: Appears intact Safety/Judgment: Appears intact Sensation Sensation Light Touch: Appears Intact (only UB assessed.) Hot/Cold: Appears Intact Proprioception: Appears Intact Stereognosis: Appears Intact Coordination Gross Motor Movements are Fluid and Coordinated: No Fine Motor Movements are Fluid and Coordinated: No Coordination and Movement Description: limited in LEs due to muscle weakness Motor  Motor Motor - Skilled Clinical Observations: generalized motor weakness  Trunk/Postural Assessment  Cervical Assessment Cervical Assessment: Within Functional Limits Thoracic  Assessment Thoracic Assessment: Within Functional Limits Lumbar Assessment Lumbar Assessment: Within Functional Limits Postural Control Postural Control: Deficits on evaluation (in sitting, WFL; standing had difficulty with full trunk extension)  Balance Static Sitting Balance Static Sitting - Level of Assistance: 5: Stand by assistance Dynamic Sitting Balance Dynamic Sitting - Level of Assistance: 5: Stand by assistance Static Standing Balance Static Standing - Level of Assistance: 2: Max assist Extremity/Trunk Assessment RUE Assessment General Strength Comments: 4-/5 LUE Assessment General Strength Comments: 4-/5  Care Tool Care Tool Self Care Eating    set up    Oral Care    Oral Care Assist Level: Supervision/Verbal cueing    Bathing   Body parts bathed by patient: Right arm;Left arm;Chest;Right upper leg;Left upper leg;Face Body parts bathed by helper: Right lower leg;Left lower leg;Buttocks;Front perineal area Body parts n/a: Abdomen Assist Level: Moderate Assistance - Patient 50 - 74%    Upper Body Dressing(including orthotics)   What is the patient wearing?: Pull over shirt   Assist Level: Supervision/Verbal cueing    Lower Body Dressing (excluding footwear)    What is the patient wearing?: Incontinence brief Assist for lower body dressing: Total Assistance - Patient < 25%    Putting on/Taking off footwear   What is the patient wearing?: Non-skid slipper socks Assist for footwear: Maximal Assistance - Patient 25 - 49%       Care Tool Toileting Toileting activity   Assist for toileting: Total Assistance - Patient < 25%     Care Tool Bed Mobility Roll left and right activity   Roll left and right assist level: Minimal Assistance - Patient > 75%    Sit to lying activity   Sit to lying assist level: Minimal Assistance - Patient > 75%    Lying to sitting edge of bed activity   Lying to sitting edge of bed assist level: Minimal Assistance - Patient > 75%     Care Tool Transfers Sit to stand transfer   Sit to stand assist level: Moderate Assistance - Patient 50 - 74%    Chair/bed transfer         Toilet transfer Toilet transfer activity did not occur: Safety/medical concerns       Care Tool Cognition Expression of Ideas and Wants Expression of Ideas and Wants: Some difficulty - exhibits some difficulty with expressing needs and ideas (e.g, some words or finishing thoughts) or speech is not clear   Understanding Verbal and Non-Verbal Content Understanding Verbal and Non-Verbal Content: Usually understands - understands most conversations, but misses some part/intent of message. Requires cues at times to understand   Memory/Recall Ability *first 3 days only Memory/Recall Ability *first 3 days only: That he or she is in a hospital/hospital unit    Refer to Care Plan for [place] 1 OT Short Term Goal 1 (Week 1): Pt will be able to sit to EOB with S to prepare for self care. OT Short Term Goal 2 (Week 1): Pt will be able to rise to stand with min A to prepare for LB self care. OT Short Term Goal 3 (Week 1): Pt will be able to stand with min A while pulling pants over hips. OT Short Term Goal 4 (Week 1):  Pt will complete transfer to toilet with min A.  Recommendations for other services: Neuropsych   Skilled Therapeutic Intervention ADL ADL Eating: Supervision/safety Grooming: Supervision/safety Upper Body Bathing: Supervision/safety Where Assessed-Upper Body Bathing: Edge of bed Lower  Body Bathing: Moderate assistance Where Assessed-Lower Body Bathing: Bed level Upper Body Dressing: Supervision/safety Where Assessed-Upper Body Dressing: Edge of bed Lower Body Dressing: Maximal assistance Where Assessed-Lower Body Dressing: Bed level Toileting: Not assessed Toilet Transfer: Not assessed   Pt seen for initial evaluation and ADL training with a focus on activity tolerance and functional mobility.  Explained role of OT and discussed POC and pt's goals. Pt very eager to participate and get better, demonstrating good initiation.  She was somewhat anxious with mobility but it did not prohibit her participation.  She sat up to EOB well with min A and demonstrated good sitting balance.  She did attempt a stand from EOB but was nervous she could not push up and even with max A unable to fully rise to stand. trialed the basic stedy and she did well rising to stand with min A in stedy.  Recommended nursing use this for transfers.  Pt participated well.  Pt resting in bed with all needs met.    Discharge Criteria: Patient will be discharged from OT if patient refuses treatment 3 consecutive times without medical reason, if treatment goals not met, if there is a change in medical status, if patient makes no progress towards goals or if patient is discharged from hospital.  The above assessment, treatment plan, treatment alternatives and goals were discussed and mutually agreed upon: by patient  [facility] 04/04/2021, 12:37 PM

## 2021-04-04 NOTE — Progress Notes (Signed)
Patient ID: Desiree Madden, female   DOB: 10-24-1951, 70 y.o.   MRN: [id number] Met with the patient and daughter to review role of the nurse CM and initiate education. Discussed bowel and bladder management educational needs and trach care. Reviewed handouts on DASH diet, food chart for renal disease, and nutritional supplements. Also reviewed team conference and plan of care. Continue to follow along to discharge to address educational needs and collaborate with the SW to facilitate prep for discharge. Margarito Liner

## 2021-04-04 NOTE — Patient Care Conference (Signed)
Inpatient RehabilitationTeam Conference and Plan of Care Update Date: 04/04/2021   Time: 11:09 AM    Patient Name: Desiree Madden      Medical Record Number: [id number]  Date of Birth: 1950/12/14 Sex: Female         Room/Bed: 4W07C/4W07C-01 Payor Info: Payor: [employer] MEDICARE / Plan: [place] HMO / Product Type: *No Product type* /    Admit Date/Time:  04/03/2021 12:48 PM  Primary Diagnosis:  Intensive care (ICU) myopathy  Hospital Problems: Principal Problem:   Intensive care (ICU) myopathy Active Problems:   Protein-calorie malnutrition, severe   Physical debility   Left foot drop   Urinary retention   Hypokalemia   Tracheostomy dependent (HCC)   PAF (paroxysmal atrial fibrillation) ([place])   Dysphagia    Expected Discharge Date: Expected Discharge Date:  (ELOS 3 weeks)  Team Members Present: Physician leading conference: Dr. Delice Lesch Care Coodinator Present: Dorien Chihuahua, RN, BSN, CRRN;Becky Dupree, LCSW Nurse Present: Dorien Chihuahua, RN PT Present: Ginnie Smart, PT OT Present: Meriel Pica, OT SLP Present: Junius Argyle, SLP PPS Coordinator present : Ileana Ladd, PT     Current Status/Progress Goal Weekly Team Focus  Bowel/Bladder   Pt has foley and colostomy.  Foley to be removed; colostomy to remain.  Assess q shift and prn.   Swallow/Nutrition/ Hydration   Eval Pending         ADL's   supervision UB, max A LB and needs Stedy with transfers  supervision  ADL training, functional mobility, strengthening, balance, pt/fam education   Mobility             Communication   Eval Pending         Safety/Cognition/ Behavioral Observations  Eval Pending         Pain   Pt reports pain managed with current regimen.  Pain will remain <3.  Assess q shift and prn.   Skin   Pt has abdominal wound vac to suction, trach in place, colostomy, RUE pressure dressing.  Skin will continue to heal and remain infection free.  Assess q shift and prn.      Discharge Planning:  New eval today, plan home with husband and daughter who works from home to assist. Pt and husband live in a mobile home on the same property as Passenger transport manager and son in-law   Team Discussion: Patient with #6 Shiley trach and PMSV worn inconsistently. Sats good. Anticipate downsizing and removal soon per Pulmonary. Foley discontinued; continue to note need for I+O catheterization for urinary retention with flomax. Colostomy with hard stool; MD adjusted medications. MD notes; anticipate use of negative pressure wound vac for another week or so.   Patient on target to meet rehab goals: All initial evals not completed at the time of team conference  *See Care Plan and progress notes for long and short-term goals.   Revisions to Treatment Plan:   Teaching Needs: Abd dressing, Intermittent catheterization, colostomy care, medications, dietary modifications, transfers, etc.   Current Barriers to Discharge: Decreased caregiver support, Trach, Wound care and Urinary retention/Intermittent catheterization required  Possible Resolutions to Barriers: Patient and family education    Medical Summary Current Status: ICU myopathy with L foot drop secondary to prolonged hospital stay, colectomy partial- colostomy, abdominal surgery, wound VAC  Barriers to Discharge: Medical stability;Neurogenic Bowel & Bladder;Wound care;Trach;Other (comments)  Barriers to Discharge Comments: Ostomy Possible Resolutions to Barriers/Weekly Focus: Therapies, voiding trial, follow weights, hard stools, patient and family edu, follow labs - K,  Mag   Continued Need for Acute Rehabilitation Level of Care: The patient requires daily medical management by a physician with specialized training in physical medicine and rehabilitation for the following reasons: Direction of a multidisciplinary physical rehabilitation program to maximize functional independence : Yes Medical management of patient stability for  increased activity during participation in an intensive rehabilitation regime.: Yes Analysis of laboratory values and/or radiology reports with any subsequent need for medication adjustment and/or medical intervention. : Yes   I attest that I was present, lead the team conference, and concur with the assessment and plan of the team.   Dorien Chihuahua B 04/04/2021, 3:12 PM

## 2021-04-04 NOTE — Progress Notes (Addendum)
[facility] PHYSICAL MEDICINE & REHABILITATION PROGRESS NOTE  Subjective/Complaints: Patient seen sitting up in bed this morning.  She states she did not sleep well overnight due to people coming in and out of her room.  She is ready begin therapies.  ROS: Denies CP, SOB, N/V/D  Objective: Vital Signs: Blood pressure (!) 90/55, pulse 86, temperature 98.4 F (36.9 C), resp. rate 18, weight 63.9 kg, SpO2 100 %. No results found. Recent Labs    04/04/21 0514  WBC 9.3  HGB 8.4*  HCT 25.9*  PLT 256   Recent Labs    04/03/21 0530 04/04/21 0514  NA 138 134*  K 3.4* 3.9  CL 100 98  CO2 27 27  GLUCOSE 88 104*  BUN 32* 25*  CREATININE 0.92 1.06*  CALCIUM 8.8* 8.6*    Intake/Output Summary (Last 24 hours) at 04/04/2021 1019 Last data filed at 04/04/2021 0835 Gross per 24 hour  Intake 440 ml  Output 1300 ml  Net -860 ml        Physical Exam: BP (!) 90/55 (BP Location: Left Arm)   Pulse 86   Temp 98.4 F (36.9 C)   Resp 18   Wt 63.9 kg   SpO2 100%   BMI 24.17 kg/m  Constitutional: No distress . Vital signs reviewed.  Frail. HENT: Normocephalic.  Atraumatic. Poor dentition. Neck: #6 cuffed trach with trach, Eyes: EOMI. No discharge. Cardiovascular: No JVD.  RRR. Respiratory: Normal effort.  No stridor.  Upper airway sounds. GI: Non-distended.  BS +.  + VAC.  Left lower quadrant ostomy. Skin: Warm and dry.  VAC suctioning without leaks.  Healed incisions. Psych: Normal mood.  Normal behavior. Musc: No edema in extremities.  No tenderness in extremities. Neuro: Alert Motor: Grossly 4+/5 throughout, except for left lower extremity dorsiflexion-1/5   Assessment/Plan: 1. Functional deficits which require 3+ hours per day of interdisciplinary therapy in a comprehensive inpatient rehab setting.  Physiatrist is providing close team supervision and 24 hour management of active medical problems listed below.  Physiatrist and rehab team continue to assess barriers to  discharge/monitor patient progress toward functional and medical goals   Care Tool:  Bathing              Bathing assist       Upper Body Dressing/Undressing Upper body dressing   What is the patient wearing?: Pull over shirt    Upper body assist Assist Level: Minimal Assistance - Patient > 75%    Lower Body Dressing/Undressing Lower body dressing      What is the patient wearing?: Incontinence brief     Lower body assist Assist for lower body dressing: Maximal Assistance - Patient 25 - 49%     Toileting Toileting    Toileting assist Assist for toileting: Total Assistance - Patient < 25% (colostomy/foley)     Transfers Chair/bed transfer  Transfers assist           Locomotion Ambulation   Ambulation assist              Walk 10 feet activity   Assist           Walk 50 feet activity   Assist           Walk 150 feet activity   Assist           Walk 10 feet on uneven surface  activity   Assist           Wheelchair  Assist               Wheelchair 50 feet with 2 turns activity    Assist            Wheelchair 150 feet activity     Assist           Medical Problem List and Plan: 1.  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   Begin CIR evaluations  Team conference today to discuss current and goals and coordination of care, home and environmental barriers, and discharge planning with nursing, case manager, and therapies. Please see conference note from today as well.  2.  Antithrombotics: -DVT/anticoagulation:  Pharmaceutical: Other (comment)--Eliquis             -antiplatelet therapy: N/A 3. Pain Management: Oxcodone prn.  4. Mood: LCSW to follow for evaluation and support.              -antipsychotic agents: N/A 5. Neuropsych: This patient is not fully cable of making decisions on her own  behalf. 6. Skin/Wound Care: Routine pressure relief measures.  7. Fluids/Electrolytes/Nutrition: Monitor I/Os.             Continue Vitamin C/Multivitamin 8. Perforated diverticulitis s/p sigmoid colectomy w/end colostomy: WOC following to education.              Changed miralax (on nectars) to Senna to keep stools soft.  9. NSTEMI/ICM/Chronic combined CHF: Compensated. On low dose Toprol XL Filed Weights   04/04/21 0500  Weight: 63.9 kg   10. SVT/PAF: Monitor HR tid--continue amiodarone, toprol and Eliquis.  Monitor increase activity 11. VDRF s/p trach: Monitor respiratory status  Continue cuffed #6 shiley             Will plan to consult trach team to assist with trach care.  12. Hypokalemia/Hypomagnesemia: Due to malnutrition/critical illness             Monitor and supplement as indicated.             Continue supplements to help with nutritional state.             Continue Marinol-->monitor for SE.   Potassium 3.9 on 4/27  Magnesium 2.0 on 4/23 13. Situational depression: Continue Remeron for smood stabilization              --Xanax prn to manage anxiety.  14. Cystopexy/Urinary retention:   Added flomax             Voiding trial  15. Chronic insomnia: Continue Xanax prn. Melatonin ineffective.              --Remeron  16. Left foot drop   PRAFO 17.  Dysphagia  D1 nectars  Monitor p.o. intake  Advance diet as tolerated  LOS: 1 days A FACE TO FACE EVALUATION WAS PERFORMED  Timmia Cogburn Lorie Phenix 04/04/2021, 10:19 AM

## 2021-04-04 NOTE — Consult Note (Signed)
[place] Nurse Consult Note: Patient transferred from [facility] to [facility] CIR Reason for Consult: NPWT to midline surgical wound Wound type: surgical Pressure Injury POA: NA Measurement: Proximal: 4cm x 2cm x 0.3cm  Distal: 7cm x 4cm x 0.5cm  Wound bed: both clean; bleeding easily; early granulation tissue Drainage (amount, consistency, odor) serosanguinous Periwound: intact; clean; creasing with staple at the left lateral aspect of the umbilicus removed today per [place] procedure/placement/frequency: Removed old NPWT dressing Periwound skin protected between open wounds with a portion of ostomy barrier ring  Filled wound with  _1__ piece of black foam Sealed NPWT dressing at 121mm HG Patient received PO pain medication per bedside nurse prior to dressing change Patient tolerated procedure well  [place] nurse will continue to provide NPWT dressing changed due to the complexity of the dressing change.  2 extra dressing kits and canisters in the room   [place] Nurse ostomy consult note Stoma type/location: RLQ, end colostomy Stomal assessment/size: 1 1/4" round, budded, pink, moist Peristomal assessment: small area of mucocutanOutput eous separation from 11-1 o'clock  Treatment options for stomal/peristomal skin: NA Ostomy pouching: 2pc. 2 3/4" used today; off centered cut to fit opening to allow for pouch to not over lap dressing.  Education provided:  Explained role of ostomy nurse and creation of stoma  Explained stoma characteristics (budded, flush, color, texture, care) Demonstrated pouch change (cutting new skin barrier, measuring stoma, cleaning peristomal skin and stoma, use of barrier ring) Education on emptying when 1/3 to 1/2 full and how to empty Demonstrated use of wick to clean spout  Patient reports she has not been taught care at this time; previous [place] notes indicate teaching performed since end of March.  Patient has had multiple surgeries and has been in the ICU on the  ventilator.  Not surprised she does not recall education. She lives at home with daughter; report her daughter will be assisting her at home. Will need to make arrangements with daughter for teaching session.  Made patient aware. She does have a difficult time looking down at the stoma due to trach in place with PMV.  Enrolled patient in [place] program: No Extra ostomy supplies in the room; will order 2 1/4" pouches for skin barriers that are in the room.   [place] Nurse will follow along with you for continued support with ostomy teaching and care [place] MSN, RN, [place], [place], [place]

## 2021-04-04 NOTE — Evaluation (Signed)
Physical Therapy Assessment and Plan  Patient Details  Name: Desiree Madden MRN: [id number] Date of Birth: 05-12-1951  PT Diagnosis: Abnormality of gait, Difficulty walking and Muscle weakness Rehab Potential: Good ELOS: 2.5 - 3 weeks   Today's Date: 04/04/2021 PT Individual Time: 1400-1500 PT Individual Time Calculation (min): 60 min    Hospital Problem: Principal Problem:   Intensive care (ICU) myopathy Active Problems:   Protein-calorie malnutrition, severe   Physical debility   Left foot drop   Urinary retention   Hypokalemia   Tracheostomy dependent (HCC)   PAF (paroxysmal atrial fibrillation) ([place])   Dysphagia   Past Medical History:  Past Medical History:  Diagnosis Date  . Hypertension   . Thyroid disease    Past Surgical History:  Past Surgical History:  Procedure Laterality Date  . CARDIAC SURGERY    . COLECTOMY WITH COLOSTOMY CREATION/HARTMANN PROCEDURE Left 03/01/2021   Procedure: COLECTOMY WITH COLOSTOMY CREATION/HARTMANN PROCEDURE;  Surgeon: Ronny Bacon, MD;  Location: [facility] ORS;  Service: General;  Laterality: Left;  . COLOSTOMY REVISION N/A 03/09/2021   Procedure: COLOSTOMY REVISION;  Surgeon: Ronny Bacon, MD;  Location: [facility] ORS;  Service: General;  Laterality: N/A;  . TRACHEOSTOMY TUBE PLACEMENT N/A 03/08/2021   Procedure: TRACHEOSTOMY;  Surgeon: Clyde Canterbury, MD;  Location: [facility] ORS;  Service: ENT;  Laterality: N/A;    Assessment & Plan Clinical Impression: Patient is a 70 year old female with history of HTN, ulcerative colitis, colovesical fistula, T11 compression fracture, diveticulits who was admitted on 02/21/21 with severe abdominal pain concerning for acute diverticulitis and large stool burden seen in colon as well as moderately progressive severe T11 compression Fx. She was started on antibiotics for UTI as well as diverticulitis. GI  recommended supportive care and augmentation of bowel regimen.  She hs progressive leucocytosis and repeat  CT abdomen 3/19 showing worsening of paracolic stranding with substantial localized wall thickening along 9 cm suspicious for progressive diverticulitis and regional colitis. Antibiotics switched to Zosyn with improvement in WBC. Bowel prep ordered with consideration of gastrografin enema for workup per Dr.  Mate (General Surgery). She developed fever with recurrent rise in WBC and CT abdomen showed abscess with question of fistula formation.  She underwent CT guided drainage of 100 cc fluid from LLQ abscess on 03/23 but continued to have rise in WBC with ongoing pain. She was taken to OR on 03/24 for Sigmoid colectomy with end colostomy, Hartman's procedure by Dr.  Mate and Cystorrhapy by Dr. Diamantina Providence.   Post op course significant for septic shock requiring prolonged intubation, MODS,  NSTEMI, severe anasarca and severe agitation due to delirium. Dr. Clayborn Bigness recommended conservative care with IV heparin once cleared by surgery. 2D echo showed EF 45-50% and Amiodarone added for episodes of wide complex tachycardia with PAF. She was transitioned to Eliquis.   She underwent tracheostomy by Dr. Richardson Landry on 03/31 and as mentation improved, tolerated extubation to ATC by 04/09.  She was started on dysphagia 1, nectar liquids dud to dysphagia. Foley placed on 04/08 due to urinary retention with follow up cystogram in 2 weeks showing no evidence of leak. She continued to have problems voiding with retention and foley was replaced on 04/24.  Encephalopathy is resolving but continues to have bouts of lethargy and poor intake. She was noted to have moderate to severe depression therefore Remeron added in addition to low dose marinol to stimulate appetite.  Therapy ongoing to address weakness due to debility as well as dysphagia. She is tolerating PMSV, is  showing improvement in activity tolerance but noted to have cognitive deficits as well as anxiety with activity. CIR recommended due to functional decline.  Patient transferred to CIR on 04/03/2021 .   Patient currently requires mod with mobility secondary to muscle weakness, decreased cardiorespiratoy endurance, unbalanced muscle activation and decreased sitting balance, decreased standing balance and decreased balance strategies.  Prior to hospitalization, patient was independent  with mobility and lived with Spouse,Daughter in a House (mobile home) home.  Home access is 4Stairs to enter.  Patient will benefit from skilled PT intervention to maximize safe functional mobility, minimize fall risk and decrease caregiver burden for planned discharge home with 24 hour assist.  Anticipate patient will benefit from follow up [facility] at discharge.  PT - End of Session Activity Tolerance: Tolerates 10 - 20 min activity with multiple rests Endurance Deficit: Yes Endurance Deficit Description: Benefits from brief seated rest breaks for recovery PT Assessment Rehab Potential (ACUTE/IP ONLY): Good PT Barriers to Discharge: Home environment access/layout;Insurance for SNF coverage;Medication compliance;Lack of/limited family support;Trach;New oxygen PT Patient demonstrates impairments in the following area(s): Balance;Nutrition;Motor;Endurance;Pain;Safety;Skin Integrity PT Transfers Functional Problem(s): Bed Mobility;Bed to Chair;Car PT Locomotion Functional Problem(s): Ambulation;Stairs PT Plan PT Intensity: Minimum of 1-2 x/day ,45 to 90 minutes PT Frequency: 5 out of 7 days PT Duration Estimated Length of Stay: 2.5 - 3 weeks PT Treatment/Interventions: Ambulation/gait training;Balance/vestibular training;Cognitive remediation/compensation;Community reintegration;Discharge planning;Disease management/prevention;DME/adaptive equipment instruction;Functional electrical stimulation;Functional mobility training;Neuromuscular re-education;Pain management;Patient/family education;Psychosocial support;Skin care/wound management;Splinting/orthotics;Stair  training;Therapeutic Activities;Therapeutic Exercise;UE/LE Strength taining/ROM;UE/LE Coordination activities;Visual/perceptual remediation/compensation;Wheelchair propulsion/positioning PT Transfers Anticipated Outcome(s): Supervision with LRAD PT Locomotion Anticipated Outcome(s): Supervision with LRAD PT Recommendation Recommendations for Other Services: Neuropsych consult Follow Up Recommendations: Home health PT Patient destination: Home Equipment Recommended: To be determined Equipment Details: Pt reports she owns a RW?   PT Evaluation Precautions/Restrictions Precautions Precautions: Fall Precaution Comments: Cuffed trach. Abdominal wound vac. LLQ ostomy. L foot drop Restrictions Weight Bearing Restrictions: No General Chart Reviewed: Yes Additional Pertinent History: history of HTN, ulcerative colitis, colovesical fistula, T11 compression fracture, diveticulits Family/Caregiver Present: No Vital SignsTherapy Vitals Temp: 99 F (37.2 C) Temp Source: Oral Pulse Rate: 82 Resp: 16 BP: (!) 111/57 Patient Position (if appropriate): Lying Oxygen Therapy SpO2: 100 % O2 Device: Tracheostomy Collar O2 Flow Rate (L/min): 5 L/min FiO2 (%): 21 % Home Living/Prior Functioning Home Living Available Help at Discharge: Family;Available 24 hours/day Type of Home: House (mobile home) Home Access: Stairs to enter [employer] of Steps: 4 Entrance Stairs-Rails: Right;Left;Can reach both Home Layout: One level Bathroom Shower/Tub: Product/process development scientist: Standard  Lives With: Spouse;Daughter Prior Function Level of Independence: Independent with basic ADLs;Independent with homemaking with ambulation;Independent with gait  Able to Take Stairs?: Yes Driving: Yes Vocation: Retired Comments: Pt reports she enjoys hiking and "farming" Vision/Perception  Geologist, engineering: Within Functional Limits Praxis Praxis: Intact  Cognition Overall Cognitive  Status: Impaired/Different from baseline Arousal/Alertness: Awake/alert Orientation Level: Oriented X4 Attention: Sustained Sustained Attention: Impaired Sustained Attention Impairment: Verbal complex;Functional complex Memory: Impaired Memory Impairment: Decreased recall of new information Immediate Memory Recall: Sock;Blue;Bed Memory Recall Sock: Without Cue Memory Recall Blue: Without Cue Memory Recall Bed: With Cue Awareness: Impaired Awareness Impairment: Intellectual impairment Problem Solving: Appears intact Safety/Judgment: Appears intact Sensation Sensation Light Touch: Appears Intact Hot/Cold: Appears Intact Proprioception: Appears Intact Stereognosis: Appears Intact Coordination Gross Motor Movements are Fluid and Coordinated: No Fine Motor Movements are Fluid and Coordinated: No Coordination and Movement Description: limited in LEs due to muscle weakness Finger Nose Finger Test:  WFL Motor  Motor Motor: Other (comment) Motor - Skilled Clinical Observations: generalized motor weakness   Trunk/Postural Assessment  Cervical Assessment Cervical Assessment: Within Functional Limits Thoracic Assessment Thoracic Assessment: Within Functional Limits Lumbar Assessment Lumbar Assessment: Within Functional Limits Postural Control Postural Control: Deficits on evaluation  Balance Balance Balance Assessed: Yes Static Sitting Balance Static Sitting - Level of Assistance: 5: Stand by assistance Dynamic Sitting Balance Dynamic Sitting - Level of Assistance: 5: Stand by assistance Static Standing Balance Static Standing - Level of Assistance: 3: Mod assist Extremity Assessment  RUE Assessment General Strength Comments: 4-/5 LUE Assessment General Strength Comments: 4-/5 RLE Assessment RLE Assessment: Within Functional Limits General Strength Comments: Grossly 4+/5 LLE Assessment LLE Assessment: Exceptions to [facility] General Strength Comments: Ankle PF 4-/5, ankle DF  1/5, knee ext 4/5, hip flex 3-/5  Care Tool Care Tool Bed Mobility Roll left and right activity   Roll left and right assist level: Minimal Assistance - Patient > 75%    Sit to lying activity   Sit to lying assist level: Minimal Assistance - Patient > 75%    Lying to sitting edge of bed activity   Lying to sitting edge of bed assist level: Minimal Assistance - Patient > 75%     Care Tool Transfers Sit to stand transfer   Sit to stand assist level: Moderate Assistance - Patient 50 - 74%    Chair/bed transfer   Chair/bed transfer assist level: Moderate Assistance - Patient 50 - 74%     Toilet transfer Toilet transfer activity did not occur: Safety/medical concerns      Car transfer   Car transfer assist level: Moderate Assistance - Patient 50 - 74%      Care Tool Locomotion Ambulation   Assist level: 2 helpers Assistive device: Parallel bars Max distance: 10  Walk 10 feet activity   Assist level: 2 helpers Assistive device: Parallel bars   Walk 50 feet with 2 turns activity Walk 50 feet with 2 turns activity did not occur: Safety/medical concerns      Walk 150 feet activity Walk 150 feet activity did not occur: Safety/medical concerns      Walk 10 feet on uneven surfaces activity Walk 10 feet on uneven surfaces activity did not occur: Safety/medical concerns      Stairs Stair activity did not occur: Safety/medical concerns        Walk up/down 1 step activity Walk up/down 1 step or curb (drop down) activity did not occur: Safety/medical concerns     Walk up/down 4 steps activity did not occuR: Safety/medical concerns  Walk up/down 4 steps activity      Walk up/down 12 steps activity Walk up/down 12 steps activity did not occur: Safety/medical concerns      Pick up small objects from floor Pick up small object from the floor (from standing position) activity did not occur: Safety/medical concerns      Wheelchair Will patient use wheelchair at discharge?: No    Wheelchair activity did not occur: N/A      Wheel 50 feet with 2 turns activity Wheelchair 50 feet with 2 turns activity did not occur: N/A    Wheel 150 feet activity Wheelchair 150 feet activity did not occur: N/A      Refer to Care Plan for Long Term Goals  SHORT TERM GOAL WEEK 1 PT Short Term Goal 1 (Week 1): Pt will complete bed mobility with CGA PT Short Term Goal 2 (Week 1): Pt will perform  bed<>chair transfers with minA and LRAD PT Short Term Goal 3 (Week 1): Pt will ambulate 24f with minA and LRAD PT Short Term Goal 4 (Week 1): Pt will initiate stair training  Recommendations for other services: Neuropsych  Skilled Therapeutic Intervention Mobility Bed Mobility Bed Mobility: Sit to Supine;Supine to Sit Supine to Sit: Minimal Assistance - Patient > 75% Sit to Supine: Minimal Assistance - Patient > 75% Transfers Transfers: Sit to Stand;Stand to Sit;Stand Pivot Transfers Sit to Stand: Moderate Assistance - Patient 50-74% Stand to Sit: Moderate Assistance - Patient 50-74% Stand Pivot Transfers: Moderate Assistance - Patient 50 - 74% Stand Pivot Transfer Details: Verbal cues for technique;Verbal cues for sequencing;Visual cues/gestures for sequencing;Visual cues/gestures for precautions/safety;Visual cues for safe use of DME/AE;Verbal cues for precautions/safety;Verbal cues for gait pattern;Manual facilitation for weight shifting;Manual facilitation for placement;Manual facilitation for weight bearing;Tactile cues for initiation;Tactile cues for sequencing;Tactile cues for weight shifting;Tactile cues for posture;Tactile cues for placement;Tactile cues for weight beaing Transfer (Assistive device): Other (Comment) (bilateral hand held assist) Locomotion  Gait Ambulation: Yes Gait Assistance: 2 Helpers (w/c follow for safety) Gait Distance (Feet): 10 Feet Assistive device: Parallel bars Gait Assistance Details: Verbal cues for sequencing;Verbal cues for technique;Verbal  cues for precautions/safety;Verbal cues for gait pattern;Manual facilitation for weight shifting;Manual facilitation for weight bearing;Verbal cues for safe use of DME/AE;Tactile cues for weight beaing;Tactile cues for placement;Tactile cues for weight shifting;Tactile cues for sequencing;Tactile cues for initiation  Skilled Intervention: Pt greeted supine in bed at start of session. Pt pleasant and cooperative. Oriented x4. Initially reluctant to participate due to fatigue level and therapist being "too small" to assist her. With words of encouragement, motivation, and redirection, pt agreeable. Strength symmetric in x4 extremities, grossly 4/5 except for L foot drop which is new (1/5). Initiated functional mobility as outlined above. Bed mobility completed with minA, sit<>stand transfers with modA and no AD (face-to-face with therapist foot surrounding her L foot to prevent inversion/eversion), stand<>pivot transfers with min/modA and noAD, gait in // bars ~141fwith minA, and car transfer with modA. Pt appears fearful of falling and general anxiety regarding her lines/leads/tubes (colostomy, abdominal wound vac, trach). Pt ended session semi-reclined in bed with needs in reach and bed alarm on.   Instructed pt in results of PT evaluation as detailed above, PT POC, rehab potential, rehab goals, and discharge recommendations. Additionally discussed CIR's policies regarding fall safety and use of chair alarm and/or quick release belt. Pt verbalized understanding and in agreement. Will update pt's family members as they become available.   Discharge Criteria: Patient will be discharged from PT if patient refuses treatment 3 consecutive times without medical reason, if treatment goals not met, if there is a change in medical status, if patient makes no progress towards goals or if patient is discharged from hospital.  The above assessment, treatment plan, treatment alternatives and goals were discussed and  mutually agreed upon: by patient  ChAlger SimonsT, DPT 04/04/2021, 3:03 PM

## 2021-04-05 ENCOUNTER — Inpatient Hospital Stay (HOSPITAL_COMMUNITY): Payer: Medicare HMO

## 2021-04-05 LAB — GLUCOSE, CAPILLARY
Glucose-Capillary: 102 mg/dL — ABNORMAL HIGH (ref 70–99)
Glucose-Capillary: 116 mg/dL — ABNORMAL HIGH (ref 70–99)
Glucose-Capillary: 86 mg/dL (ref 70–99)
Glucose-Capillary: 110 mg/dL — ABNORMAL HIGH (ref 70–99)

## 2021-04-05 NOTE — Progress Notes (Signed)
Physical Therapy Session Note  Patient Details  Name: Desiree Madden MRN: [id number] Date of Birth: Jan 27, 1951  Today's Date: 04/05/2021 PT Individual Time: 1015-1100 PT Individual Time Calculation (min): 45 min   Short Term Goals: Week 1:  PT Short Term Goal 1 (Week 1): Pt will complete bed mobility with CGA PT Short Term Goal 2 (Week 1): Pt will perform bed<>chair transfers with minA and LRAD PT Short Term Goal 3 (Week 1): Pt will ambulate 37ft with minA and LRAD PT Short Term Goal 4 (Week 1): Pt will initiate stair training  Skilled Therapeutic Interventions/Progress Updates:    Pt greeted supine in bed, agreeable to therapy session. Reports she just returned from swallow study and says she's exhausted from that but is excited because her diet has been upgraded to thin liquids. Pt reports mild lower abdominal pain but no distress during session. Extra time needed at start of session for organizing lines/leads/tubes. Pt saturating 100% on RA. Trach collar with PMV in place. Pt continues to be quite anxious with mobility and her lines/leads - needs redirection and encouragement.   Donned pants with modA for threading LE's and encouraging her to use UE"s to assist with pulling them over hips. She struggled with producing adequate clearance during bridging technique and needed to roll L<>R to pull over hips. Supine<>sit with minA with hospital bed features, encouraged log rolling technique to protect abdominal wound. She's able to sit EOB with SBA and forward scoots at EOB with supervision. Stand<>pivot transfer with minA from EOB to w/c, cues needed for monitoring her L foot and hand placement.  Pt reporting lightheadedness once sitting in w/c. Oxygen 100% on RA with HR 92. BP reading 118/67 (82). Instruction on therapeutic breathing for calming.  W/c transport for time management to main rehab gym. Focused remainder of session on functional gait training.  Gait training 75ft + 23ft with R  hand rail and minA with +2 assist for w/c follow for safety. Demo's L foot slap, step-to gait, decreased gait speed. No knee buckling or LOB but lateral unsteadiness present. Pt also requesting HHA for her LUE.  Pt returned to her room with [place] for time management. Pt agreeable to remain seated in w/c until next therapy session. Safety belt alarm on and needs within reach at end of session.   Therapy Documentation Precautions:  Precautions Precautions: Fall Precaution Comments: Cuffed trach. Abdominal wound vac. LLQ ostomy. L foot drop Restrictions Weight Bearing Restrictions: No General:    Therapy/Group: Individual Therapy  Ryken Paschal P Naeem Quillin PT 04/05/2021, 7:34 AM

## 2021-04-05 NOTE — Progress Notes (Signed)
Modified Barium Swallow Progress Note  Patient Details  Name: Desiree Madden MRN: [id number] Date of Birth: 01-10-1951  Today's Date: 04/05/2021  Modified Barium Swallow completed.  Full report located under Chart Review in the Imaging Section.  Brief recommendations include the following:  Clinical Impression  PMV in place during MBS. Patient presents with a mild oropharyngeal dysphagia with oral phase impacted significantly by patient's missing and poor condition of dentition. (She reported that her teeth have been decaying since heart bypass surgeries and she does intend to get dental work, likely dentures/partials). Oral phase marked by decreased mastication of regular solids and mild delay in anterior to posterior transit with puree solids and regular solids. Pharyngeal phase of swallow consisted of swallow initiation delay to level of vallecular sinus with thin liquids via cup sips and pyriform sinus with thin liquids via straw sips. One instance of flash penetration observed when patient drinking thin liquid barium with barium tablet but no intances of aspiration observed. Trace amount of vallecular residuals present after initial swallow with all consistencies however these cleared with subsequent swallows. SLP observed patient with cup sip of thin liquids with PMV off and only noticeable difference was swallow initiation delay occured at level of pyriform sinus instead of vallecular sinus.   Swallow Evaluation Recommendations       SLP Diet Recommendations: Dysphagia 2 (Fine chop) solids;Thin liquid   Liquid Administration via: Cup;Straw   Medication Administration: Whole meds with puree   Supervision: Patient able to self feed;Intermittent supervision to cue for compensatory strategies   Compensations: Minimize environmental distractions;Slow rate;Small sips/bites   Postural Changes: Seated upright at 90 degrees   Oral Care Recommendations: Oral care BID      Sonia Baller, MA, CCC-SLP Speech Therapy

## 2021-04-05 NOTE — Progress Notes (Signed)
Occupational Therapy Session Note  Patient Details  Name: Desiree Madden MRN: [id number] Date of Birth: March 30, 1951  Today's Date: 04/05/2021 OT Individual Time: 1245-8099 OT Individual Time Calculation (min): 63 min    Short Term Goals: Week 1:  OT Short Term Goal 1 (Week 1): Pt will be able to sit to EOB with S to prepare for self care. OT Short Term Goal 2 (Week 1): Pt will be able to rise to stand with min A to prepare for LB self care. OT Short Term Goal 3 (Week 1): Pt will be able to stand with min A while pulling pants over hips. OT Short Term Goal 4 (Week 1): Pt will complete transfer to toilet with min A.  Skilled Therapeutic Interventions/Progress Updates:    Pt in bed to start session with nursing present for administering of medication.  She requested to go to the bathroom, so transfer to the EOB was completed with min guard assist and then she completed stand pivot to the wheelchair at mod assist.  She was able to complete stand pivot from the wheelchair to the 3:1 at mod assist.  Clothing management was completed at mod assist with hygiene at supervision in sitting.  She then transferred back to the wheelchair at [facility] assist and mod demonstrational cueing for hand placement to push up from the chair.  After washing her hands, she was transported down to the dayroom where she worked on sit to stand with use of the RW.  She was able to complete several sit to stands with min assist for 1-1.5 mins.  Had her work on standing and letting go of the walker as well with overall min assist to take a sip from her water.  Finished with standing and marching in place at min assist before transferring back to the room to complete session.  Min assist for stand pivot transfer back to the bed with the RW, with min instructional cueing to square herself up to the surface with the walker before sitting.  She was left with call button and phone in reach.  Oxygen sats 99% on humidified air through trach  collar.    Therapy Documentation Precautions:  Precautions Precautions: Fall Precaution Comments: Cuffed trach. Abdominal wound vac. LLQ ostomy. L foot drop Restrictions Weight Bearing Restrictions: No  Pain: Pain Assessment Pain Scale: Faces Faces Pain Scale: Hurts a little bit Pain Type: Acute pain Pain Location: Abdomen Pain Orientation: Mid Pain Descriptors / Indicators: Discomfort Pain Onset: With Activity Pain Intervention(s): Repositioned ADL: See Care Tool Section for some details of mobility and selfcare  Therapy/Group: Individual Therapy  Paislyn Domenico OTR/L 04/05/2021, 4:05 PM

## 2021-04-05 NOTE — Consult Note (Signed)
[place] Nurse ostomy follow up Stoma type/location: LMQ colostomy Patient complaining of pain above stoma this AM.  Is producing thick brown stool.  Senokot has been increased.  I have encouraged her to drink more liquids and get up out of bed and be active. She agrees.  Since she is producing stool, will continue to monitor.  Pouch change and NPWT (VAC) dressing 9A-10A tomorrow when not in therapy.  Will follow.  Domenic Moras MSN, RN, FNP-BC CWON Wound, Ostomy, Continence Nurse Pager [phone]

## 2021-04-05 NOTE — Progress Notes (Signed)
Physical Therapy Session Note  Patient Details  Name: Desiree Madden MRN: [id number] Date of Birth: [birth year]  Today's Date: 04/05/2021 PT Individual Time: 5750-5183 PT Individual Time Calculation (min): 38 min   Short Term Goals: Week 1:  PT Short Term Goal 1 (Week 1): Pt will complete bed mobility with CGA PT Short Term Goal 2 (Week 1): Pt will perform bed<>chair transfers with minA and LRAD PT Short Term Goal 3 (Week 1): Pt will ambulate 73ft with minA and LRAD PT Short Term Goal 4 (Week 1): Pt will initiate stair training  Skilled Therapeutic Interventions/Progress Updates:    Pt received sitting in w/c and agreeable to therapy session. Pt already wearing PMV - humidified air removed for therapy session. Pt reports need to use bathroom. Transported in/out of bathroom in w/c. Stand pivot w/c<>BSC over toilet using B UE support around therapist's shoulders with mod assist for lifting to stand and balance while turning - cuing for increased trunk/hip extension to improve upright posture. Standing with B UE support around therapist's shoulders and min/mod assist for balance while therapist performed total assist LB clothing management. Pt continent of bladder and performed seated peri-care without assist. Sitting in w/c performed hand hygiene with pt requiring cuing to complete task showing some impaired sequencing. Transported to/from gym in w/c for time management and energy conservation. Stand pivot w/c<>Nustep using BUE support around therapist's shoulders with mod assist for lifting to stand and min/mod assist for balance while turning. Performed B LE reciprocal movement pattern on Nustep against level 3 increased to level 4 resistance for 30min10sec totaling 189steps targeting strengthening and endurance. Pt transported back to room in w/c and requests for assistance to bed due to fatigue. R stand pivot w/c>EOB stand pivot as described above. Sit>supine with supervision and pt relying on use of  B UEs to assist with B LE management onto bed. Therapist ensured all lines intact and O2 via trach collar donned. Pt left supine in bed with needs in reach and bed alarm on.  Therapy Documentation Precautions:  Precautions Precautions: Fall Precaution Comments: Cuffed trach. Abdominal wound vac. LLQ ostomy. L foot drop Restrictions Weight Bearing Restrictions: No  Pain:   At end of session, reports she is continuing to have the abdominal pain at colostomy - RN aware and colostomy nurse planning to arrive soon.   Therapy/Group: Individual Therapy  Tawana Scale , PT, DPT, CSRS  04/05/2021, 8:01 AM

## 2021-04-05 NOTE — Progress Notes (Signed)
Desiree Madden PHYSICAL MEDICINE & REHABILITATION PROGRESS NOTE  Subjective/Complaints: Patient seen sitting up in bed this morning.  She states she slept well overnight.  She notes pain at ostomy site.  Discussed hard stool with nursing and patient as well.  Discussed trach with therapies.  She notes she had a good first day of therapies yesterday.  ROS: + Ostomy site pain.  Denies CP, SOB, N/V/D  Objective: Vital Signs: Blood pressure (!) 108/58, pulse 80, temperature 97.6 F (36.4 C), resp. rate 18, weight 63.2 kg, SpO2 98 %. No results found. Recent Labs    04/04/21 0514  WBC 9.3  HGB 8.4*  HCT 25.9*  PLT 256   Recent Labs    04/03/21 0530 04/04/21 0514  NA 138 134*  K 3.4* 3.9  CL 100 98  CO2 27 27  GLUCOSE 88 104*  BUN 32* 25*  CREATININE 0.92 1.06*  CALCIUM 8.8* 8.6*    Intake/Output Summary (Last 24 hours) at 04/05/2021 1354 Last data filed at 04/05/2021 1332 Gross per 24 hour  Intake 370 ml  Output --  Net 370 ml        Physical Exam: BP (!) 108/58 (BP Location: Left Arm)   Pulse 80   Temp 97.6 F (36.4 C)   Resp 18   Wt 63.2 kg   SpO2 98%   BMI 23.90 kg/m  Constitutional: No distress . Vital signs reviewed.  Frail. HENT: Normocephalic.  Atraumatic. Poor dentition. Neck: #6 cuffed trach with PMV Eyes: EOMI. No discharge. Cardiovascular: No JVD.  RRR. Respiratory: Normal effort.  No stridor.  Bilateral clear to auscultation. GI: Non-distended.  BS +.  + VAC.  Left lower quadrant ostomy. Skin: Warm and dry.  Intact. Psych: Normal mood.  Normal behavior. Musc: No edema in extremities.  No tenderness in extremities. Neuro: Alert Motor: Grossly 4+/5 throughout, except for left lower extremity dorsiflexion   Assessment/Plan: 1. Functional deficits which require 3+ hours per day of interdisciplinary therapy in a comprehensive inpatient rehab setting.  Physiatrist is providing close team supervision and 24 hour management of active medical problems  listed below.  Physiatrist and rehab team continue to assess barriers to discharge/monitor patient progress toward functional and medical goals   Care Tool:  Bathing    Body parts bathed by patient: Right arm,Left arm,Chest,Right upper leg,Left upper leg,Face   Body parts bathed by helper: Right lower leg,Left lower leg,Buttocks,Front perineal area Body parts n/a: Abdomen   Bathing assist Assist Level: Moderate Assistance - Patient 50 - 74%     Upper Body Dressing/Undressing Upper body dressing   What is the patient wearing?: Pull over shirt    Upper body assist Assist Level: Supervision/Verbal cueing    Lower Body Dressing/Undressing Lower body dressing      What is the patient wearing?: Incontinence brief,Pants     Lower body assist Assist for lower body dressing: Minimal Assistance - Patient > 75%     Toileting Toileting    Toileting assist Assist for toileting: Minimal Assistance - Patient > 75%     Transfers Chair/bed transfer  Transfers assist     Chair/bed transfer assist level: Moderate Assistance - Patient 50 - 74% (stand pivot)     Locomotion Ambulation   Ambulation assist      Assist level: 2 helpers Assistive device: Parallel bars Max distance: 10   Walk 10 feet activity   Assist     Assist level: 2 helpers Assistive device: Parallel bars   Walk  50 feet activity   Assist Walk 50 feet with 2 turns activity did not occur: Safety/medical concerns         Walk 150 feet activity   Assist Walk 150 feet activity did not occur: Safety/medical concerns         Walk 10 feet on uneven surface  activity   Assist Walk 10 feet on uneven surfaces activity did not occur: Safety/medical concerns         Wheelchair     Assist Will patient use wheelchair at discharge?: No   Wheelchair activity did not occur: N/A         Wheelchair 50 feet with 2 turns activity    Assist    Wheelchair 50 feet with 2 turns  activity did not occur: N/A       Wheelchair 150 feet activity     Assist  Wheelchair 150 feet activity did not occur: N/A        Medical Problem List and Plan: 1.  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   Continue CIR 2.  Antithrombotics: -DVT/anticoagulation:  Pharmaceutical: Other (comment)--Eliquis             -antiplatelet therapy: N/A 3. Pain Management: Oxcodone prn.  4. Mood: LCSW to follow for evaluation and support.              -antipsychotic agents: N/A 5. Neuropsych: This patient is not fully cable of making decisions on her own behalf. 6. Skin/Wound Care: Routine pressure relief measures.  7. Fluids/Electrolytes/Nutrition: Monitor I/Os.             Continue Vitamin C/Multivitamin 8. Perforated diverticulitis s/p sigmoid colectomy w/end colostomy: WOC following to education.              Changed miralax (on nectars) to Senna to keep stools soft.   Colace added 9. NSTEMI/ICM/Chronic combined CHF: Compensated. On low dose Toprol XL Filed Weights   04/04/21 0500 04/05/21 0456  Weight: 63.9 kg 63.2 kg   Stable on 4/28 10. SVT/PAF: Monitor HR tid--continue amiodarone, toprol and Eliquis.  Controlled on 4/28  Monitor increase activity 11. VDRF s/p trach: Monitor respiratory status  Continue cuffed #6 shiley             Will consult Pulm for trach eval 12. Hypokalemia/Hypomagnesemia: Due to malnutrition/critical illness             Monitor and supplement as indicated.             Continue supplements to help with nutritional state.             Continue Marinol-->monitor for SE.   Potassium 3.9 on 4/27  Magnesium 2.0 on 4/23 13. Situational depression: Continue Remeron for smood stabilization              --Xanax prn to manage anxiety.  14. Cystopexy/Urinary retention:   Added flomax             Appears to be improving 15. Chronic insomnia: Continue Xanax prn. Melatonin  ineffective.              --Remeron  16. Left foot drop   PRAFO 17.  Dysphagia  D1 nectars, advance to D2 thins  Monitor p.o. intake  Continue to advance diet as tolerated  LOS: 2 days A FACE TO FACE EVALUATION WAS PERFORMED  Desiree Madden Desiree Madden 04/05/2021, 1:54 PM

## 2021-04-05 NOTE — Progress Notes (Signed)
Pt complaining of 9/10 pain coming from colostomy bag, stoma WDL, stool is very formed coming out of ostomy bag. Pt does not want to take the rest of her medications until pain is gone. [place] nurse contacted, charge nurse made aware, MD notified.   Dayna Ramus

## 2021-04-06 DIAGNOSIS — Z430 Encounter for attention to tracheostomy: Secondary | ICD-10-CM

## 2021-04-06 DIAGNOSIS — R1312 Dysphagia, oropharyngeal phase: Secondary | ICD-10-CM

## 2021-04-06 LAB — GLUCOSE, CAPILLARY
Glucose-Capillary: 84 mg/dL (ref 70–99)
Glucose-Capillary: 106 mg/dL — ABNORMAL HIGH (ref 70–99)
Glucose-Capillary: 132 mg/dL — ABNORMAL HIGH (ref 70–99)
Glucose-Capillary: 93 mg/dL (ref 70–99)

## 2021-04-06 MED ORDER — OXYCODONE HCL 5 MG PO TABS: 5.0000 mg | ORAL_TABLET | Freq: Three times a day (TID) | ORAL | Status: DC | PRN

## 2021-04-06 NOTE — Consult Note (Signed)
[place] Nurse wound follow up Patient receiving care in [facility] [phone]. Upon entry into room, patient's ostomy pouch was completely full of soft light brown stool and had blow off and undermined the skin barrier. Staff RN to room to assist with hygiene needs. Wound type: surgical abdominal wound; 2 sites separated by an intact epidermal bridge Measurement: na Wound bed: both wounds are beefy red Drainage (amount, consistency, odor) serosanginous in canister Periwound: intact; segments of barrier ring placed in creases at 9 and 3 and inferior border Dressing procedure/placement/frequency: all pieces of black foam removed from wound bed. One piece of black foam placed into each of the 2 wound beds. Drape applied to cover intact skin. Drape applied over entire wound dressing; immediate seal obtained. Patient tolerated very well.   [place] Nurse ostomy follow up Stoma type/location: LUQ colostomy Stomal assessment/size: 1 1/4 inches, moist, budded; mild mucocutaneous separation from 10 to 12. Peristomal assessment:  intact Treatment options for stomal/peristomal skin: barrier ring placed Output: huge amount of soft light brown stool Ostomy pouching: 2pc. 2 1/4 inch Education provided: none today except I told the patient and her primary nurse to cleanse around the stoma with water ONLY in order to avoid residues. Patient would not look at the stoma.  Enrolled patient in [place] Start Discharge program: No  Extra ostomy supplies requested, and some in the room.  Val Riles, RN, MSN, CWOCN, CNS-BC, pager [phone]

## 2021-04-06 NOTE — Progress Notes (Signed)
Speech Language Pathology Daily Session Note  Patient Details  Name: Desiree Madden MRN: [id number] Date of Birth: 10/21/1951  Today's Date: 04/06/2021 SLP Individual Time: 76-1555 SLP Individual Time Calculation (min): 30 min  Short Term Goals: Week 1: SLP Short Term Goal 1 (Week 1): Patient will donn/doff PMSV with supervision verbal cues. SLP Short Term Goal 2 (Week 1): Patient will consume current diet with minimal overt s/s of aspiration with supervision verbal cues for useof swallowing compensatory strategies.  Skilled Therapeutic Interventions:   Patient seen for skilled ST session focusing on speech goals with daughter present for portion of session. Patient reported that MD had come and plan was for capping trials of trach over the weekend and then for it to be removed if she did well. She had a lot of questions and was slightly anxious but seemed more high-strung and distracted, requiring frequent cues to redirect. SLP showed patient how to don and doff PMV which she was able to return demonstrate. SLP then demonstrated by using gloved hand to finger occlude her trach when she asked what capping trial would be like. SLP emphasized not to finger occlude her trach as germs could infiltrate into inner cannula. A little bit later, patient observed to finger occlude trach. SLP provided education to patient's daughter regarding MBS results, patient's progress, etc. Patient continues to benefit from skilled SLP intervention maximize cognitive, speech and swallow function prior to discharge.  Pain Pain Assessment Pain Scale: 0-10 Faces Pain Scale: No hurt  Therapy/Group: Individual Therapy  Sonia Baller, MA, CCC-SLP Speech Therapy

## 2021-04-06 NOTE — Consult Note (Signed)
NAME:  Desiree Madden, MRN:  [id number], DOB:  [birth year], LOS: 3 ADMISSION DATE:  04/03/2021, CONSULTATION DATE:  04/06/2021 REFERRING MD:  Desiree Arn, MD, CHIEF COMPLAINT:  Trach management  History of Present Illness:  Patient is a 70 year old woman with Past medical history of hypertension ulcerative colitis, colovesical fistula, who presented initially to [facility] in March with abdominal pain and was admitted for acute diverticulitis. She had a drain placed in the LLQ of her abdomen for pelvic abscess and then had a prolonged ICU course complicated by intubation, septic shock and failed extubation requiring tracheostomy on 3/31 by ENT.  She had prolonged ventilator wean and has been on trach collar since 4/8. She was transferred to the floor at [facility] and admitted to [facility] for ongoing rehab.  Since admission she started working with therapies yesterday.  She has been tolerating Passy-Muir valve during the day.  She has been taking it off at night.  She is on room air.  She is on a dysphagia diet.  She does cough up some occasional thick white secretions but is able to suction them herself.  She has been sitting up at side of the bed and has done some walking with assistance with ambulation but not much yet.  Pertinent  Medical History  40-pack-year smoking history, quit 2015 Hypertension Ulcerative colitis  Interim History / Subjective:   Objective   Blood pressure 113/65, pulse 86, temperature 98.9 F (37.2 C), temperature source Oral, resp. rate 12, weight 57.6 kg, SpO2 100 %.    FiO2 (%):  [21 %-28 %] 21 %   Intake/Output Summary (Last 24 hours) at 04/06/2021 1245 Last data filed at 04/06/2021 0800 Gross per 24 hour  Intake 590 ml  Output 100 ml  Net 490 ml   Filed Weights   04/04/21 0500 04/05/21 0456 04/06/21 0500  Weight: 63.9 kg 63.2 kg 57.6 kg    Examination: General: In wheelchair, no distress, appears well HENT: 6.0 cuffed Shiley trach with PMV on Lungs: No increased  work of breathing, lungs are clear to auscultation bilaterally, no wheezes or crackles Cardiovascular: Regular rate and rhythm, no murmurs or gallops Abdomen: Soft, nondistended, nontender, normal bowel sounds Extremities: Thin, no edema Neuro: Normal speech moves all 4 extremities GU: Not examined MSK: No rashes  Labs/imaging that I havepersonally reviewed  (right click and "Reselect all SmartList Selections" daily)   Chest x-ray 4/12 shows no acute cardiopulmonary process, tracheostomy in place, trachea midline, midline sternotomy wires   Lab Results  Component Value Date   WBC 9.3 04/04/2021   HGB 8.4 (L) 04/04/2021   HCT 25.9 (L) 04/04/2021   MCV 94.2 04/04/2021   PLT 256 04/04/2021   Lab Results  Component Value Date   NA 134 (L) 04/04/2021   K 3.9 04/04/2021   CL 98 04/04/2021   CO2 27 04/04/2021      Assessment & Plan:  Desiree Madden is a 70 y.o. woman with prolonged ICU stay for acute abdominal process with status post tracheostomy for prolonged ventilator weaning.  Now admitted to inpatient rehab.  Undergoing therapies.  Pulmonary is being consulted to assist with tracheostomy management and possible decannulation.  Recommend continuing PMV during the day as tolerated.  Would proceed with capping trials nocturnally over the weekend.  If doing well over the weekend would proceed with decannulation on Monday.  Would monitor her oximetry overnight to ensure no desaturation.  Pulmonary will follow Monday.  Please call with questions.  Desiree Llamas,  MD Pulmonary and [place]    Labs   CBC: Recent Labs  Lab 04/01/21 0455 04/04/21 0514  WBC 7.3 9.3  NEUTROABS  --  5.9  HGB 9.0* 8.4*  HCT 26.9* 25.9*  MCV 91.2 94.2  PLT 323 161    Basic Metabolic Panel: Recent Labs  Lab 03/31/21 0904 04/03/21 0530 04/04/21 0514  NA 136 138 134*  K 3.6 3.4* 3.9  CL 99 100 98  CO2 _0 GLUCOSE 91 88 104*  BUN 28* 32* 25*   CREATININE 1.03* 0.92 1.06*  CALCIUM 8.9 8.8* 8.6*  MG 2.0  --   --    GFR: Estimated Creatinine Clearance: 42.6 mL/min (A) (by C-G formula based on SCr of 1.06 mg/dL (H)). Recent Labs  Lab 04/01/21 0455 04/04/21 0514  WBC 7.3 9.3    Liver Function Tests: Recent Labs  Lab 04/04/21 0514  AST 27  ALT 22  ALKPHOS 68  BILITOT 0.5  PROT 6.0*  ALBUMIN 2.3*   No results for input(s): LIPASE, AMYLASE in the last 168 hours. No results for input(s): AMMONIA in the last 168 hours.  ABG    Component Value Date/Time   PHART 7.47 (H) 03/18/2021 1413   PCO2ART 29 (L) 03/18/2021 1413   PO2ART 121 (H) 03/18/2021 1413   HCO3 21.1 03/18/2021 1413   ACIDBASEDEF 1.8 03/18/2021 1413   O2SAT 98.9 03/18/2021 1413     Coagulation Profile: No results for input(s): INR, PROTIME in the last 168 hours.  Cardiac Enzymes: No results for input(s): CKTOTAL, CKMB, CKMBINDEX, TROPONINI in the last 168 hours.  HbA1C: No results found for: HGBA1C  CBG: Recent Labs  Lab 04/05/21 1200 04/05/21 1645 04/05/21 2112 04/06/21 0605 04/06/21 1221  GLUCAP 116* 86 110* 84 106*    Review of Systems:   No chest pain, no shortness of breath + coughing with secretions No lower extremity edema No reflux Otherwise 10 point review of systems was reviewed and negative  Past Medical History:  She,  has a past medical history of Hypertension and Thyroid disease.   Surgical History:   Past Surgical History:  Procedure Laterality Date  . CARDIAC SURGERY    . COLECTOMY WITH COLOSTOMY CREATION/HARTMANN PROCEDURE Left 03/01/2021   Procedure: COLECTOMY WITH COLOSTOMY CREATION/HARTMANN PROCEDURE;  Surgeon: Ronny Bacon, MD;  Location: [facility] ORS;  Service: General;  Laterality: Left;  . COLOSTOMY REVISION N/A 03/09/2021   Procedure: COLOSTOMY REVISION;  Surgeon: Ronny Bacon, MD;  Location: [facility] ORS;  Service: General;  Laterality: N/A;  . TRACHEOSTOMY TUBE PLACEMENT N/A 03/08/2021   Procedure:  TRACHEOSTOMY;  Surgeon: Clyde Canterbury, MD;  Location: [facility] ORS;  Service: ENT;  Laterality: N/A;     Social History:   reports that she has quit smoking. She has never used smokeless tobacco. She reports that she does not drink alcohol and does not use drugs.   Family History:  Her family history includes Arthritis in an other family member; CAD in her mother.   Allergies Allergies  Allergen Reactions  . Baclofen     Other reaction(s): Headache  . Sertraline     Other reaction(s): Other (See Comments), Other (See Comments) Muscle aches Muscle aches   . Sulfa Antibiotics     Other reaction(s): Other (See Comments), Other (See Comments) Pt was told not to take it due to colitis Pt was told not to take it due to colitis   . Ibuprofen Nausea And Vomiting  . Trazodone  And Nefazodone      Home Medications  Prior to Admission medications   Medication Sig Start Date End Date Taking? Authorizing Provider  alendronate (FOSAMAX) 70 MG tablet Take 70 mg by mouth once a week. Take with a full glass of water on an empty stomach.    [provider]  ALPRAZolam Duanne Moron) 0.25 MG tablet Take 1 tablet (0.25 mg total) by mouth 2 (two) times daily as needed for anxiety or sleep. 04/03/21   Ezekiel Slocumb, DO  amiodarone (PACERONE) 200 MG tablet Take 1 tablet (200 mg total) by mouth 2 (two) times daily. 04/03/21   Ezekiel Slocumb, DO  apixaban (ELIQUIS) 5 MG TABS tablet Take 1 tablet (5 mg total) by mouth 2 (two) times daily. 04/03/21   Ezekiel Slocumb, DO  ascorbic acid (VITAMIN C) 500 MG tablet Take 1 tablet (500 mg total) by mouth 2 (two) times daily. 04/03/21   Ezekiel Slocumb, DO  atorvastatin (LIPITOR) 20 MG tablet Take 20 mg by mouth daily.    [provider]  chlorhexidine gluconate, MEDLINE KIT, (PERIDEX) 0.12 % solution 15 mLs by Mouth Rinse route 2 (two) times daily. 04/03/21   Ezekiel Slocumb, DO  docusate (COLACE) 50 MG/5ML liquid Take 10 mLs (100 mg total) by  mouth 2 (two) times daily. 04/03/21   Ezekiel Slocumb, DO  dronabinol (MARINOL) 2.5 MG capsule Take 1 capsule (2.5 mg total) by mouth 2 (two) times daily before lunch and supper. 04/03/21   Ezekiel Slocumb, DO  fluticasone (FLONASE) 50 MCG/ACT nasal spray Place 2 sprays into both nostrils daily.    [provider]  insulin aspart (NOVOLOG) 100 UNIT/ML injection Inject 0-9 Units into the skin daily. Correction coverage: Sensitive  CBG < 70: Hypoglycemia protocol  CBG 70 - 120:  0 units  CBG 121 - 150: 1 unit  CBG 151 - 200: 2 units  CBG 201 - 250: 3 units  CBG 251 - 300: 5 units  CBG 301 - 350: 7 units  CBG 351 - 400: 9 units  CBG > 400: call MD and obtain STAT lab verification 04/03/21   Ezekiel Slocumb, DO  levothyroxine (SYNTHROID) 75 MCG tablet Take 1 tablet (75 mcg total) by mouth daily at 6 (six) AM. 04/04/21   Ezekiel Slocumb, DO  lip balm (BLISTEX) OINT Apply 1 application topically as needed for lip care. 04/03/21   Nicole Kindred A, DO  metoprolol succinate (TOPROL-XL) 25 MG 24 hr tablet Take 0.5 tablets (12.5 mg total) by mouth daily. 04/03/21   Nicole Kindred A, DO  mirtazapine (REMERON) 15 MG tablet Take 1 tablet (15 mg total) by mouth at bedtime. 04/03/21   Ezekiel Slocumb, DO  Mouthwashes (MOUTH RINSE) LIQD solution 15 mLs by Mouth Rinse route 2 (two) times daily. 04/03/21   Ezekiel Slocumb, DO  Multiple Vitamin (MULTIVITAMIN WITH MINERALS) TABS tablet Take 1 tablet by mouth daily. 04/04/21   Ezekiel Slocumb, DO  Nutritional Supplements (FEEDING SUPPLEMENT, NEPRO CARB STEADY,) LIQD Take 237 mLs by mouth 3 (three) times daily between meals. 04/03/21   Nicole Kindred A, DO  ondansetron (ZOFRAN) 4 MG tablet Take 1 tablet (4 mg total) by mouth every 6 (six) hours as needed for nausea. 04/03/21   Nicole Kindred A, DO  oxyCODONE (OXY IR/ROXICODONE) 5 MG immediate release tablet Take 1 tablet (5 mg total) by mouth every 6 (six) hours as needed for moderate pain. 04/03/21  Nicole Kindred A, DO  pantoprazole (PROTONIX) 40 MG tablet Take 1 tablet (40 mg total) by mouth daily. 04/04/21   Ezekiel Slocumb, DO  polyethylene glycol (MIRALAX / GLYCOLAX) 17 g packet Take 17 g by mouth daily. 04/04/21   Ezekiel Slocumb, DO

## 2021-04-06 NOTE — Progress Notes (Signed)
Occupational Therapy Session Note  Patient Details  Name: Desiree Madden MRN: [id number] Date of Birth: 02-10-1951  Today's Date: 04/06/2021 OT Individual Time: 2992-4268 OT Individual Time Calculation (min): 27 min    Short Term Goals: Week 1:  OT Short Term Goal 1 (Week 1): Pt will be able to sit to EOB with S to prepare for self care. OT Short Term Goal 2 (Week 1): Pt will be able to rise to stand with min A to prepare for LB self care. OT Short Term Goal 3 (Week 1): Pt will be able to stand with min A while pulling pants over hips. OT Short Term Goal 4 (Week 1): Pt will complete transfer to toilet with min A.  Skilled Therapeutic Interventions/Progress Updates:    Treatment session with focus on sit > stand, dynamic standing balance, and activity tolerance.  Pt received upright in w/c with daughter present in room.  Pt initially impulsive with frequent weight shifting and moving multiple lines/leads, however not attempting to stand or any unsafe behavior.  Engaged in sit> stand and dynamic standing in [place] with pt requiring min assist for initial stand and then pt able to stand from [place] with CGA to supervision.  Pt completed peg board puzzle replication in standing in [place] with CGA and good stability in BLE and only one UE support.  Completed sit > stand x2 with RW with min-mod assist for anterior weight shift.  Pt reports BLE "shaky" but maintaining good static standing ~20-30 seconds at a time.  Pt returned to sitting and passed off to PT for next session.  Therapy Documentation Precautions:  Precautions Precautions: Fall Precaution Comments: Cuffed trach. Abdominal wound vac. LLQ ostomy. L foot drop Restrictions Weight Bearing Restrictions: No General:   Vital Signs: Therapy Vitals Temp: 97.7 F (36.5 C) Pulse Rate: 87 Resp: 16 BP: (!) 106/91 Patient Position (if appropriate): Sitting Oxygen Therapy SpO2: 99 % O2 Device: Room Air O2 Flow Rate (L/min): 5  L/min FiO2 (%): 21 % Pain: Pain Assessment Pain Scale: 0-10 Pain Score: 0-No pain   Therapy/Group: Individual Therapy  Simonne Come 04/06/2021, 3:17 PM

## 2021-04-06 NOTE — Progress Notes (Signed)
Occupational Therapy Session Note  Patient Details  Name: Desiree Madden MRN: [id number] Date of Birth: [birth year]  Today's Date: 04/06/2021 OT Individual Time: 7672-0947 OT Individual Time Calculation (min): 60 min    Short Term Goals: Week 1:  OT Short Term Goal 1 (Week 1): Pt will be able to sit to EOB with S to prepare for self care. OT Short Term Goal 2 (Week 1): Pt will be able to rise to stand with min A to prepare for LB self care. OT Short Term Goal 3 (Week 1): Pt will be able to stand with min A while pulling pants over hips. OT Short Term Goal 4 (Week 1): Pt will complete transfer to toilet with min A.  Skilled Therapeutic Interventions/Progress Updates:    Pt received in bed, agreeable to therapy. Came to sitting EOB with close S and use of bed features. Squat-pivot > w/c with mod A and mod VCs for technique. Stand-pivot to toilet with use of grab bar and mod A. Max A for LB clothing management in standing, close S seated pericare. Washed hair seated at sink with hair washing tray. Pt able to hold tray and dry/comb hair set-up A. SatO2 at mid 80s to 100% on RA throughout session, although suspect unreliable sensor reading. W/c transport to and from gym 2/2 time management. Squat-pivot to and from mat same manner as above. Pt completed 1x10 of forward/backward rows, chest press, shoulder press, and volleyball for ~1 min with 3 lb dowel rod. Req seated break in between sets and stating she is fatigue. C/o of mild dizziness throughout session, but otherwise denies pain.   Pt left in w/c with safety belt alarm engaged, RT present, call bell in reach, trach collar redonned, and all immediate needs met.    Therapy Documentation Precautions:  Precautions Precautions: Fall Precaution Comments: Cuffed trach. Abdominal wound vac. LLQ ostomy. L foot drop Restrictions Weight Bearing Restrictions: No Pain: Pain Assessment Pain Scale: 0-10 Pain Score: 0-No pain Pain Location:  Abdomen Pain Intervention(s): Medication (See eMAR) ADL: See Care Tool for more details.   Therapy/Group: Individual Therapy  Volanda Napoleon MS, OTR/L  04/06/2021, 12:19 PM

## 2021-04-06 NOTE — Progress Notes (Signed)
Physical Therapy Session Note  Patient Details  Name: Desiree Madden MRN: [id number] Date of Birth: 09/13/1951  Today's Date: 04/06/2021 PT Missed Time: 52 Minutes Missed Time Reason: Patient unwilling to participate;Other (Comment) (Patient needing to remain in bed for wound care that is supposed to show up in next 15 mins or so) Session 2: 1504-1530; 26 mins  Short Term Goals: Week 1:  PT Short Term Goal 1 (Week 1): Pt will complete bed mobility with CGA PT Short Term Goal 2 (Week 1): Pt will perform bed<>chair transfers with minA and LRAD PT Short Term Goal 3 (Week 1): Pt will ambulate 42ft with minA and LRAD PT Short Term Goal 4 (Week 1): Pt will initiate stair training  Skilled Therapeutic Interventions/Progress Updates:    Session 1: Patient supine in bed, with RN providing AM rx. She denies pain, but both patient and RN report that wound care should be arriving in next ~15 mins for vac change. Patient needing to be in bed for that and requesting to not participate until after wound care had finished.   Session 2: Patient received sitting up in wc after OT, agreeable to PT. She denies pain. PT transporting patient in wc to therapy gym for time management and energy conservation. Gait training in parallel bars x3 bouts of 4steps forward, 4 steps backward. Patient with heavy reliance on B UE and decreased gait speed. Significant L foot drop noted, which patient required verbal cues to increase L hip flexion to improve foot clearance. Patient reporting 5/10 SOB after each bout. PT educating patient on pursed lip breathing to assist with recovering breath. Patient completing mini squats in parallel bars with MinA. Patient compensating for decreased quad strength with hip hinge instead of knee flexion. Patient requesting to return to room to use bathroom. [place] stand pivot to toilet. Clothing management and perihygiene completed in standing with supervision, but ModA to remain standing. Patient  remaining up in wc, hand off to SLP.   Therapy Documentation Precautions:  Precautions Precautions: Fall Precaution Comments: Cuffed trach. Abdominal wound vac. LLQ ostomy. L foot drop Restrictions Weight Bearing Restrictions: No    Therapy/Group: Individual Therapy  Karoline Caldwell, PT, DPT, CBIS  04/06/2021, 7:48 AM

## 2021-04-06 NOTE — Progress Notes (Signed)
[place] PHYSICAL MEDICINE & REHABILITATION PROGRESS NOTE  Subjective/Complaints: Appreciate WOC wound care today. Patient has no other concerns Vitals stable  ROS: + Ostomy site pain.  Denies CP, SOB, N/V/D  Objective: Vital Signs: Blood pressure 113/65, pulse 86, temperature 98.9 F (37.2 C), temperature source Oral, resp. rate 12, weight 57.6 kg, SpO2 100 %. DG Swallowing Func-Speech Pathology  Result Date: 04/05/2021 Objective Swallowing Evaluation: Type of Study: MBS-Modified Barium Swallow Study  Patient Details Name: Desiree Madden MRN: [id number] Date of Birth: Jan 04, 1951 Today's Date: 04/05/2021 Time: SLP Start Time (ACUTE ONLY): 0086 -SLP Stop Time (ACUTE ONLY): 1315 SLP Time Calculation (min) (ACUTE ONLY): 60 min Past Medical History: Past Medical History: Diagnosis Date . Hypertension  . Thyroid disease  Past Surgical History: Past Surgical History: Procedure Laterality Date . CARDIAC SURGERY   . COLECTOMY WITH COLOSTOMY CREATION/HARTMANN PROCEDURE Left 03/01/2021  Procedure: COLECTOMY WITH COLOSTOMY CREATION/HARTMANN PROCEDURE;  Surgeon: Ronny Bacon, MD;  Location: [facility] ORS;  Service: General;  Laterality: Left; . COLOSTOMY REVISION N/A 03/09/2021  Procedure: COLOSTOMY REVISION;  Surgeon: Ronny Bacon, MD;  Location: [facility] ORS;  Service: General;  Laterality: N/A; . TRACHEOSTOMY TUBE PLACEMENT N/A 03/08/2021  Procedure: TRACHEOSTOMY;  Surgeon: Clyde Canterbury, MD;  Location: [facility] ORS;  Service: ENT;  Laterality: N/A; HPI: Pt is a 70 y.o. female who admitted on 02/21/2021 w/ Diverticulitis with Colovesical fistula and UTI, now 35 Days Post-Op s/p Hartman's Procedure for complicated diverticulitis with gangrenous changes and perforation with feculent peritonitis and 4 days s/p colostomy revision.  Pt remained on vent failing wean; Tracheostomy placed on 03/08/2021.  Pt is now on Trach Collar O2 support weaning from full vent support on 03/15/2021.  Ongoing Wound Care.  MBSS completed on  03/22/2021.  Subjective: pt much more awake, mouthing/talking w/ air leak. Confusion noted but able to follow basic instructions w/ cues. PMV placed w/out difficulty. Assessment / Plan / Recommendation CHL IP CLINICAL IMPRESSIONS 04/05/2021 Clinical Impression PMV in place during MBS. Patient presents with a mild oropharyngeal dysphagia with oral phase impacted significantly by patient's missing and poor condition of dentition. (She reported that her teeth have been decaying since heart bypass surgeries and she does intend to get dental work, likely dentures/partials). Oral phase marked by decreased mastication of regular solids and mild delay in anterior to posterior transit with puree solids and regular solids. Pharyngeal phase of swallow consisted of swallow initiation delay to level of vallecular sinus with thin liquids via cup sips and pyriform sinus with thin liquids via straw sips. One instance of flash penetration observed when patient drinking thin liquid barium with barium tablet but no intances of aspiration observed. Trace amount of vallecular residuals present after initial swallow with all consistencies however these cleared with subsequent swallows. SLP observed patient with cup sip of thin liquids with PMV off and only noticeable difference was swallow initiation delay occured at level of pyriform sinus instead of vallecular sinus. SLP Visit Diagnosis Dysphagia, oropharyngeal phase (R13.12) Attention and concentration deficit following -- Frontal lobe and executive function deficit following -- Impact on safety and function Mild aspiration risk   CHL IP TREATMENT RECOMMENDATION 03/22/2021 Treatment Recommendations Therapy as outlined in treatment plan below   Prognosis 03/22/2021 Prognosis for Safe Diet Advancement Fair Barriers to Reach Goals Cognitive deficits;Severity of deficits;Time post onset Barriers/Prognosis Comment Tracheostomy; PMV wear CHL IP DIET RECOMMENDATION 04/05/2021 SLP Diet  Recommendations Dysphagia 2 (Fine chop) solids;Thin liquid Liquid Administration via Cup;Straw Medication Administration Whole meds with puree Compensations Minimize  environmental distractions;Slow rate;Small sips/bites Postural Changes Seated upright at 90 degrees   CHL IP OTHER RECOMMENDATIONS 04/05/2021 Recommended Consults -- Oral Care Recommendations Oral care BID Other Recommendations --   CHL IP FOLLOW UP RECOMMENDATIONS 03/28/2021 Follow up Recommendations Inpatient Rehab   CHL IP FREQUENCY AND DURATION 03/22/2021 Speech Therapy Frequency (ACUTE ONLY) min 3x week Treatment Duration 2 weeks      CHL IP ORAL PHASE 04/05/2021 Oral Phase Impaired Oral - Pudding Teaspoon -- Oral - Pudding Cup -- Oral - Honey Teaspoon -- Oral - Honey Cup -- Oral - Nectar Teaspoon -- Oral - Nectar Cup -- Oral - Nectar Straw -- Oral - Thin Teaspoon -- Oral - Thin Cup WFL Oral - Thin Straw WFL Oral - Puree Reduced posterior propulsion Oral - Mech Soft -- Oral - Regular Impaired mastication;Reduced posterior propulsion Oral - Multi-Consistency -- Oral - Pill Decreased bolus cohesion Oral Phase - Comment --  CHL IP PHARYNGEAL PHASE 04/05/2021 Pharyngeal Phase Impaired Pharyngeal- Pudding Teaspoon -- Pharyngeal -- Pharyngeal- Pudding Cup -- Pharyngeal -- Pharyngeal- Honey Teaspoon -- Pharyngeal -- Pharyngeal- Honey Cup -- Pharyngeal -- Pharyngeal- Nectar Teaspoon -- Pharyngeal -- Pharyngeal- Nectar Cup -- Pharyngeal -- Pharyngeal- Nectar Straw -- Pharyngeal -- Pharyngeal- Thin Teaspoon -- Pharyngeal -- Pharyngeal- Thin Cup Delayed swallow initiation-vallecula;Pharyngeal residue - valleculae Pharyngeal Material enters airway, remains ABOVE vocal cords then ejected out Pharyngeal- Thin Straw Delayed swallow initiation-pyriform sinuses;Delayed swallow initiation-vallecula;Pharyngeal residue - valleculae Pharyngeal Material does not enter airway Pharyngeal- Puree Pharyngeal residue - valleculae Pharyngeal -- Pharyngeal- Mechanical Soft --  Pharyngeal -- Pharyngeal- Regular Pharyngeal residue - valleculae Pharyngeal -- Pharyngeal- Multi-consistency -- Pharyngeal -- Pharyngeal- Pill -- Pharyngeal -- Pharyngeal Comment --  CHL IP CERVICAL ESOPHAGEAL PHASE 04/05/2021 Cervical Esophageal Phase WFL Pudding Teaspoon -- Pudding Cup -- Honey Teaspoon -- Honey Cup -- Nectar Teaspoon -- Nectar Cup -- Nectar Straw -- Thin Teaspoon -- Thin Cup -- Thin Straw -- Puree -- Mechanical Soft -- Regular -- Multi-consistency -- Pill -- Cervical Esophageal Comment -- Angela Nevin, MA, CCC-SLP Speech Therapy              Recent Labs    04/04/21 0514  WBC 9.3  HGB 8.4*  HCT 25.9*  PLT 256   Recent Labs    04/04/21 0514  NA 134*  K 3.9  CL 98  CO2 27  GLUCOSE 104*  BUN 25*  CREATININE 1.06*  CALCIUM 8.6*    Intake/Output Summary (Last 24 hours) at 04/06/2021 1243 Last data filed at 04/06/2021 0800 Gross per 24 hour  Intake 590 ml  Output 100 ml  Net 490 ml        Physical Exam: BP 113/65 (BP Location: Left Arm)   Pulse 86   Temp 98.9 F (37.2 C) (Oral)   Resp 12   Wt 57.6 kg   SpO2 100%   BMI 21.79 kg/m  Gen: no distress, frail HEENT:  Neck: #6 cuffed trach with PMV, poor dentition Eyes: EOMI. No discharge. Cardiovascular: No JVD.  RRR. Respiratory: Normal effort.  No stridor.  Bilateral clear to auscultation. GI: Non-distended.  BS +.  + VAC.  Left lower quadrant ostomy. Skin: Warm and dry.  Intact. Psych: Normal mood.  Normal behavior. Musc: No edema in extremities.  No tenderness in extremities. Neuro: Alert Motor: Grossly 4+/5 throughout, except for left lower extremity dorsiflexion   Assessment/Plan: 1. Functional deficits which require 3+ hours per day of interdisciplinary therapy in a comprehensive inpatient rehab setting.  Physiatrist is providing close team supervision and 24 hour management of active medical problems listed below.  Physiatrist and rehab team continue to assess barriers to discharge/monitor  patient progress toward functional and medical goals   Care Tool:  Bathing    Body parts bathed by patient: Right arm,Left arm,Chest,Right upper leg,Left upper leg,Face   Body parts bathed by helper: Right lower leg,Left lower leg,Buttocks,Front perineal area Body parts n/a: Abdomen   Bathing assist Assist Level: Moderate Assistance - Patient 50 - 74%     Upper Body Dressing/Undressing Upper body dressing   What is the patient wearing?: Pull over shirt    Upper body assist Assist Level: Supervision/Verbal cueing    Lower Body Dressing/Undressing Lower body dressing      What is the patient wearing?: Incontinence brief,Pants     Lower body assist Assist for lower body dressing: Minimal Assistance - Patient > 75%     Toileting Toileting    Toileting assist Assist for toileting: Moderate Assistance - Patient 50 - 74% (LB clothing management in standing)     Transfers Chair/bed transfer  Transfers assist     Chair/bed transfer assist level: Moderate Assistance - Patient 50 - 74% (squat-pivot)     Locomotion Ambulation   Ambulation assist      Assist level: 2 helpers Assistive device: Parallel bars Max distance: 10   Walk 10 feet activity   Assist     Assist level: 2 helpers Assistive device: Parallel bars   Walk 50 feet activity   Assist Walk 50 feet with 2 turns activity did not occur: Safety/medical concerns         Walk 150 feet activity   Assist Walk 150 feet activity did not occur: Safety/medical concerns         Walk 10 feet on uneven surface  activity   Assist Walk 10 feet on uneven surfaces activity did not occur: Safety/medical concerns         Wheelchair     Assist Will patient use wheelchair at discharge?: No   Wheelchair activity did not occur: N/A         Wheelchair 50 feet with 2 turns activity    Assist    Wheelchair 50 feet with 2 turns activity did not occur: N/A       Wheelchair 150  feet activity     Assist  Wheelchair 150 feet activity did not occur: N/A        Medical Problem List and Plan: 1.  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   Continue CIR 2.  Antithrombotics: Continue Eliquis             -antiplatelet therapy: N/A 3. Pain Management: Decrease Oxcodone to q8H prn.  4. Mood: LCSW to follow for evaluation and support.              -antipsychotic agents: N/A 5. Neuropsych: This patient is not fully cable of making decisions on her own behalf. 6. Skin/Wound Care: Routine pressure relief measures.  7. Fluids/Electrolytes/Nutrition: Monitor I/Os.             Continue Vitamin C/Multivitamin 8. Perforated diverticulitis s/p sigmoid colectomy w/end colostomy: WOC following to education.              Changed miralax (on nectars) to Senna to keep stools soft.   Ostomy full and blown off: d/c colace 9. NSTEMI/ICM/Chronic combined CHF: Compensated. On  low dose Toprol XL Filed Weights   04/04/21 0500 04/05/21 0456 04/06/21 0500  Weight: 63.9 kg 63.2 kg 57.6 kg   Decreased on 4/29, continue to monitor 10. SVT/PAF: Monitor HR tid--continue amiodarone, toprol and Eliquis.  Controlled on 4/29, continue to monitor  Monitor increase activity 11. VDRF s/p trach: Monitor respiratory status  Continue cuffed #6 shiley             Will consult Pulm for trach eval 12. Hypokalemia/Hypomagnesemia: Due to malnutrition/critical illness             Monitor and supplement as indicated.             Continue supplements to help with nutritional state.             Continue Marinol-->monitor for SE.   Potassium 3.9 on 4/27  Magnesium 2.0 on 4/23 13. Situational depression: Continue Remeron for smood stabilization              --Xanax prn to manage anxiety.  14. Cystopexy/Urinary retention:   Added flomax             Appears to be improving 15. Chronic insomnia: Continue Xanax prn.  Melatonin ineffective.              --Remeron  16. Left foot drop   PRAFO 17.  Dysphagia  D1 nectars, advance to D2 thins  Monitor p.o. intake  Continue to advance diet as tolerated  LOS: 3 days A FACE TO FACE EVALUATION WAS PERFORMED  Clide Deutscher Lillyauna Jenkinson 04/06/2021, 12:43 PM

## 2021-04-07 LAB — GLUCOSE, CAPILLARY
Glucose-Capillary: 93 mg/dL (ref 70–99)
Glucose-Capillary: 109 mg/dL — ABNORMAL HIGH (ref 70–99)
Glucose-Capillary: 97 mg/dL (ref 70–99)
Glucose-Capillary: 128 mg/dL — ABNORMAL HIGH (ref 70–99)

## 2021-04-07 MED ORDER — OXYCODONE HCL 5 MG PO TABS
5.0000 mg | ORAL_TABLET | Freq: Two times a day (BID) | ORAL | Status: DC | PRN
  Administered 2021-04-09 – 2021-04-15 (×10): 5 mg via ORAL
  Filled 2021-04-07 (×11): qty 1

## 2021-04-07 NOTE — Progress Notes (Signed)
[place] PHYSICAL MEDICINE & REHABILITATION PROGRESS NOTE  Subjective/Complaints: No complaints She feels that therapy has been doing well. Denies shortness of breat, secretions  ROS: + Ostomy site pain.  Denies CP, SOB, N/V/D, secretions  Objective: Vital Signs: Blood pressure 104/62, pulse 76, temperature 97.9 F (36.6 C), resp. rate 17, weight 62.2 kg, SpO2 95 %. No results found. No results for input(s): WBC, HGB, HCT, PLT in the last 72 hours. No results for input(s): NA, K, CL, CO2, GLUCOSE, BUN, CREATININE, CALCIUM in the last 72 hours.  Intake/Output Summary (Last 24 hours) at 04/07/2021 1131 Last data filed at 04/07/2021 0640 Gross per 24 hour  Intake 360 ml  Output 350 ml  Net 10 ml        Physical Exam: BP 104/62 (BP Location: Right Arm)   Pulse 76   Temp 97.9 F (36.6 C)   Resp 17   Wt 62.2 kg   SpO2 95%   BMI 23.53 kg/m   Gen: no distress, normal appearing HEENT: oral mucosa pink and moist, Neck: #6 cuffed trach with PMV, poor dentition Cardio: Reg rate Chest: normal effort, normal rate of breathing GI: Non-distended.  BS +.  + VAC.  Left lower quadrant ostomy. Skin: Warm and dry.  Intact. Psych: Normal mood.  Normal behavior. Musc: No edema in extremities.  No tenderness in extremities. Neuro: Alert Motor: Grossly 4+/5 throughout, except for left lower extremity dorsiflexion   Assessment/Plan: 1. Functional deficits which require 3+ hours per day of interdisciplinary therapy in a comprehensive inpatient rehab setting.  Physiatrist is providing close team supervision and 24 hour management of active medical problems listed below.  Physiatrist and rehab team continue to assess barriers to discharge/monitor patient progress toward functional and medical goals   Care Tool:  Bathing    Body parts bathed by patient: Right arm,Left arm,Chest,Right upper leg,Left upper leg,Face   Body parts bathed by helper: Right lower leg,Left lower  leg,Buttocks,Front perineal area Body parts n/a: Abdomen   Bathing assist Assist Level: Moderate Assistance - Patient 50 - 74%     Upper Body Dressing/Undressing Upper body dressing   What is the patient wearing?: Pull over shirt    Upper body assist Assist Level: Supervision/Verbal cueing    Lower Body Dressing/Undressing Lower body dressing      What is the patient wearing?: Incontinence brief,Pants     Lower body assist Assist for lower body dressing: Minimal Assistance - Patient > 75%     Toileting Toileting    Toileting assist Assist for toileting: Moderate Assistance - Patient 50 - 74% (LB clothing management in standing)     Transfers Chair/bed transfer  Transfers assist     Chair/bed transfer assist level: Moderate Assistance - Patient 50 - 74% (squat-pivot)     Locomotion Ambulation   Ambulation assist      Assist level: Minimal Assistance - Patient > 75% Assistive device: Parallel bars Max distance: 8   Walk 10 feet activity   Assist     Assist level: 2 helpers Assistive device: Parallel bars   Walk 50 feet activity   Assist Walk 50 feet with 2 turns activity did not occur: Safety/medical concerns         Walk 150 feet activity   Assist Walk 150 feet activity did not occur: Safety/medical concerns         Walk 10 feet on uneven surface  activity   Assist Walk 10 feet on uneven surfaces activity did not  occur: Safety/medical concerns         Wheelchair     Assist Will patient use wheelchair at discharge?: No   Wheelchair activity did not occur: N/A         Wheelchair 50 feet with 2 turns activity    Assist    Wheelchair 50 feet with 2 turns activity did not occur: N/A       Wheelchair 150 feet activity     Assist  Wheelchair 150 feet activity did not occur: N/A        Medical Problem List and Plan: 1.  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   Continue CIR 2.  Antithrombotics: Continue Eliquis             -antiplatelet therapy: N/A 3. Pain Management: Decrease Oxcodone to q12H prn.  4. Mood: LCSW to follow for evaluation and support.              -antipsychotic agents: N/A 5. Neuropsych: This patient is not fully cable of making decisions on her own behalf. 6. Skin/Wound Care: Routine pressure relief measures.  7. Fluids/Electrolytes/Nutrition: Monitor I/Os.             Continue Vitamin C/Multivitamin 8. Perforated diverticulitis s/p sigmoid colectomy w/end colostomy: WOC following to education.   Ostomy full and blown off: d/c colace. Less full today, can continue senna 9. NSTEMI/ICM/Chronic combined CHF: Compensated. On low dose Toprol XL Filed Weights   04/05/21 0456 04/06/21 0500 04/07/21 0450  Weight: 63.2 kg 57.6 kg 62.2 kg   Stable 4/30, continue to monitor 10. SVT/PAF: Monitor HR tid--continue amiodarone, toprol and Eliquis.  Controlled on 4/30, continue to monitor  Monitor increase activity 11. VDRF s/p trach: Monitor respiratory status  Continue cuffed #6 shiley             Will consult Pulm for trach eval 12. Hypokalemia/Hypomagnesemia: Due to malnutrition/critical illness             Monitor and supplement as indicated.             Continue supplements to help with nutritional state.             Continue Marinol-->monitor for SE.   Potassium 3.9 on 4/27  Magnesium 2.0 on 4/23 13. Situational depression: Continue Remeron for smood stabilization              --Xanax prn to manage anxiety.  14. Cystopexy/Urinary retention:   Added flomax             Appears to be improving 15. Chronic insomnia: Continue Xanax prn. Melatonin ineffective.              --Remeron  16. Left foot drop   PRAFO 17.  Dysphagia  D1 nectars, advance to D2 thins  Monitor p.o. intake  Continue to advance diet as tolerated  LOS: 4 days A FACE TO FACE EVALUATION WAS  PERFORMED  Clide Deutscher Danila Eddie 04/07/2021, 11:31 AM

## 2021-04-07 NOTE — Progress Notes (Signed)
PCCM Interval Note  S / Interval events: The patient states that she slept with PMV in place o/n No evidence dyspnea, no difficulty secretions reported.   O: Vitals:   04/07/21 0005 04/07/21 0300 04/07/21 0448 04/07/21 0450  BP:   104/62   Pulse:   76   Resp:   17   Temp:   97.9 F (36.6 C)   TempSrc:      SpO2: 100% 98% 95%   Weight:    62.2 kg  Comfortable chronically ill woman, in bed PMV in place with strong voice. Poor dentition Lungs clear CV regular w no M Surgical abd wounds dressed, LUQ ostomy  No edema  Impression and recommendations:  70 yo woman with a complicated CC illness, due to sepsis of intraabdominal source. Tolerating rehab, PMV, PO diet. Has been doing PMV day, trach open at night. Last pm she did PMV all night and tolerated.   Will ask RT to place a red cap and leave in place. If well tolerated through the weekend then assess for possible decannulation on 5/2.    [name], MD, PhD 04/07/2021, 9:05 AM [facility] [phone] or if no answer before 7:00PM call [phone] For any issues after 7:00PM please call eLink [phone]

## 2021-04-07 NOTE — Plan of Care (Signed)
  Problem: RH BOWEL ELIMINATION Goal: RH STG MANAGE BOWEL WITH ASSISTANCE Description: STG Manage Bowel with min Assistance. Outcome: Not Progressing; colostomy

## 2021-04-07 NOTE — Progress Notes (Signed)
Occupational Therapy Session Note  Patient Details  Name: Desiree Madden MRN: [id number] Date of Birth: 09-04-1951  Today's Date: 04/07/2021 OT Individual Time: 1007-1103 OT Individual Time Calculation (min): 56 min    Short Term Goals: Week 1:  OT Short Term Goal 1 (Week 1): Pt will be able to sit to EOB with S to prepare for self care. OT Short Term Goal 2 (Week 1): Pt will be able to rise to stand with min A to prepare for LB self care. OT Short Term Goal 3 (Week 1): Pt will be able to stand with min A while pulling pants over hips. OT Short Term Goal 4 (Week 1): Pt will complete transfer to toilet with min A.  Skilled Therapeutic Interventions/Progress Updates:    Pt received semi-reclined in bed, denies pain, agreeable to therapy but requesting ostomy bag change. RN/NT notified and NT present to change bag. Pt doffed/donned dress close S. Mod A to doff/don brief, min A to don pants, min A for STS with RW from w/c, req mod VCs for technique each time. Pt able to sponge bathe UB and BLE bed level close S. Doffed B socks close S. Squat-pivot mod A to w/c. Completed oral and hair care set-up A at sink. W/c transport to room 2/2 time management. Completed 5 min on kinetron for improved BLE strength and activity tolerance at 30 cm/sec. Req freq rest breaks 2/2 coughing. SatO2 throughout session at high 90s to 100% on RA. Pt interested in attending dance group this date.   Pt left in w/c awaiting dance group, group lead and rehab tech notified.  Therapy Documentation Precautions:  Precautions Precautions: Fall Precaution Comments: Cuffed trach. Abdominal wound vac. LLQ ostomy. L foot drop Restrictions Weight Bearing Restrictions: No Pain:   denies ADL: See Care Tool for more details.   Therapy/Group: Individual Therapy  Volanda Napoleon MS, OTR/L  04/07/2021, 6:53 AM

## 2021-04-08 LAB — GLUCOSE, CAPILLARY
Glucose-Capillary: 88 mg/dL (ref 70–99)
Glucose-Capillary: 97 mg/dL (ref 70–99)
Glucose-Capillary: 83 mg/dL (ref 70–99)
Glucose-Capillary: 205 mg/dL — ABNORMAL HIGH (ref 70–99)

## 2021-04-08 NOTE — Progress Notes (Signed)
Occupational Therapy Session Note  Patient Details  Name: Desiree Madden MRN: [id number] Date of Birth: [birth year]  Today's Date: 04/08/2021 OT Individual Time: 1002-1057 OT Individual Time Calculation (min): 55 min   Short Term Goals: Week 1:  OT Short Term Goal 1 (Week 1): Pt will be able to sit to EOB with S to prepare for self care. OT Short Term Goal 2 (Week 1): Pt will be able to rise to stand with min A to prepare for LB self care. OT Short Term Goal 3 (Week 1): Pt will be able to stand with min A while pulling pants over hips. OT Short Term Goal 4 (Week 1): Pt will complete transfer to toilet with min A.  Skilled Therapeutic Interventions/Progress Updates:    Pt greeted in bed with no c/o pain. Bathing/dressing needs met, pt did have to use the restroom. She was motivated to upgrade her toileting transfer status on the safety plan, as at this time she uses the bedpan to void bladder. First pt applied lotion to lower legs and also donned her gripper socks utilizing reclined figure 4 position bilaterally. She then practiced [facility] transfer to [facility] in bathroom twice, once having continent bladder void, wanting assistance for hygiene in the back to ensure cleanliness. Pt required CGA-Mod A for sit<stands, fluctuating depending on her fatigue levels. Reviewed diaphragmatic breathing principles to utilize in bed and during therapeutic activity. Due to absence of safety plan sheet, on her 2nd transfer we practiced with NT Patrice. Advised for pt to have +2 nursing assistance with 1 person managing her abdominal wound vac. Pts sats remained 98-99% on RA, pts trach capped. Pt also completed handwashing while semi perched at the sink to work on supported standing balance/LE strengthening. At end of session pt returned to bed and was left with all needs within reach and bed alarm set, satting at 98%.   Note that [facility] transfer could not be completed to toilet due to layout of bathroom.    Therapy  Documentation Precautions:  Precautions Precautions: Fall Precaution Comments: Cuffed trach. Abdominal wound vac. LLQ ostomy. L foot drop Restrictions Weight Bearing Restrictions: No Vital Signs: Therapy Vitals Temp: (!) 97.5 F (36.4 C) Temp Source: Oral Pulse Rate: 80 Resp: 18 BP: 111/65 Patient Position (if appropriate): Lying Oxygen Therapy SpO2: 99 % O2 Device: Room Air Pain: pt denied pain during session, just some SOB at times   ADL: ADL Eating: Supervision/safety Grooming: Supervision/safety Upper Body Bathing: Supervision/safety Where Assessed-Upper Body Bathing: Edge of bed Lower Body Bathing: Moderate assistance Where Assessed-Lower Body Bathing: Bed level Upper Body Dressing: Supervision/safety Where Assessed-Upper Body Dressing: Edge of bed Lower Body Dressing: Maximal assistance Where Assessed-Lower Body Dressing: Bed level Toileting: Not assessed Toilet Transfer: Not assessed      Therapy/Group: Individual Therapy  Lelah Rennaker A Cliff Damiani 04/08/2021, 3:25 PM

## 2021-04-08 NOTE — Progress Notes (Signed)
RT note- Patients trach is capped, found to be somewhat raspy with deep breath, small amount of air removed from cuff and patient states that she feels her air movement is improved. Continue to monitor.

## 2021-04-08 NOTE — Progress Notes (Signed)
Physical Therapy Session Note  Patient Details  Name: Desiree Madden MRN: [id number] Date of Birth: 18-Apr-1951  Today's Date: 04/08/2021 PT Individual Time: 0810-0908 and 3338-3291 PT Individual Time Calculation (min): 58 min and 60 min  Short Term Goals: Week 1:  PT Short Term Goal 1 (Week 1): Pt will complete bed mobility with CGA PT Short Term Goal 2 (Week 1): Pt will perform bed<>chair transfers with minA and LRAD PT Short Term Goal 3 (Week 1): Pt will ambulate 7ft with minA and LRAD PT Short Term Goal 4 (Week 1): Pt will initiate stair training  Skilled Therapeutic Interventions/Progress Updates:    Session 1: Pt received supine in bed with pulmonary MD exiting upon therapist arrival. Pt's trach is now capped - pt is on continuous SpO2 monitoring sating >90% on RA during ADLs in room. Supine>sitting L EOB, HOB flat and not using bedrail, with supervision and max sequential cuing for sequencing of logroll technique to increase pt independence. Sitting EOB threaded pants mod assist for time management and donned shoes max assist - educated pt on need for larger tennis shoes to allow AFO trial due to L LE foot drop - reinforced importance of wearing PRAFO at night and pt confirms she has been. Sit>stand EOB>RW with min assist for lifting to stand - pt able to release hands from AD and pull pants up over hips with only CGA/min assist for steadying (no LE instability noted). L stand pivot to w/c using RW with CGA/min assist. Sit>stand w/c>sink with min assist for lifting to stand and then CGA for steadying while pt completed oral care and face washing, demos ability to remove B UE support to complete these tasks. RN in/out for medication administration. Gait training ~39ft using RW with min assist and therapist providing w/c follow for safety - pt demos very slow, guarded B LE stepping with decreased step length and decreased B LE foot clearance. RT in/out for assessment and made aware of the  following. At end of session, pt left sitting in w/c with needs in reach and lines intact. No seat belt alarm present in pt's room - NT notified and present to address.  Of note: Pt with forceful/labored and loud inhalation throughout session with frequent coughing and some secretion production - RN notified - pt states pulmonary MD said pt needed to get up all of the secretions (pt noted to cough up something 2x during session) - cuing and pt self cuing to slow breath and take deeper breaths Respiratory Therapist in/out during session to assess patient and made aware of pt's breathing during PT session.    Session 2: Pt received supine in bed with NT present and pt reporting having just went to bathroom. NT assessed vitals per rounding schedule - WNL. Pt agreeable to therapy session. Pt remains on continuous monitoring: SpO2 96% and HR 82bpm. Supine>sitting L EOB, HOB flat but using bedrail, with supervision. R stand pivot EOB>w/c using RW with min assist for lifting into stand and CGA for steadying while turning.  Transported to/from gym in w/c for time management and energy conservation. Donned L LE ankle DF assist ACE wrap.   Gait training 42ft, 68ft, and 49ft using RW with min assist and +2 w/c follow for safety and line management (seated break between each) - during 1st walk pt noted to take an exaggerated step forward with L LE and then step-to with R LE in addition to bilateral shoulders elevated with increased WBing through B UEs - throughout  each walk therapist cued pt for reciprocal stepping with symmetrical step lengths and stance times with pt improving, repeated cuing throughout to relax her shoulders and promote increased B LE WBing - during last walk pt having some SOB and requiring 2x standing rest breaks prior to finally having seated rest. SpO2 monitored throughout on continuous monitor and pt noted to have SpO2 drop to 80% while ambulating but feel this is due to poor reading because  once pt sat down it recovered to >90% in <20seconds, also pt not having worsening symptoms with noticed decreased SpO2. Transported pt back up to room and removed DF ACE wrap. L stand pivot w/c>EOB using RW with min assist to lift into standing and CGA for steadying while turning. Sit>supine with supervision; however, pt requires assistance from B UEs to assist with getting B LEs onto bed. Pt requesting education on how to perform L ankle stretch due to foot drop - therapist educated her on passively moving ankle into DF and eversion - pt demonstrates understanding. Pt will benefit from AFO consult. Pt left supine in bed with needs in reach, lines intact, and bed alarm on.  During 2nd session, overall pt's inhalation appeared less forceful/labored with pt reporting the RT had modified the amount of air in her trach. Of note, after ambulating pt with increased coughing and secretion development - educated pt on importance of mobility to promote deeper breathing and clearing secretions.  Therapy Documentation Precautions:  Precautions Precautions: Fall,Other (comment) Precaution Comments: trach capped 4/30, Abdominal wound vac. LLQ ostomy. L foot drop Restrictions Weight Bearing Restrictions: No  Pain:   Session 1: Reports only 1x brief moment of left lower quadrant pain in abdomen but then denies pain remainder of session.    Session 2: No reports of pain throughout session.   Therapy/Group: Individual Therapy  Tawana Scale , PT, DPT, CSRS  04/08/2021, 7:58 AM

## 2021-04-08 NOTE — Progress Notes (Signed)
PCCM Interval Note  S / Interval events: Red cap placed yesterday 4/30, tolerating well Gets her secretions up effectively. Taking some PO   O: Vitals:   04/07/21 2344 04/08/21 0334 04/08/21 0523 04/08/21 0700  BP:   (!) 107/57   Pulse: 84 76 71   Resp: $Remo'15 17 14   'RUEVf$ Temp:   98.1 F (36.7 C)   TempSrc:   Oral   SpO2: 98% 99% 100%   Weight:    59.1 kg  Comfortable Cap on trach. Site looks good. No secretions. Poor dentition Lungs clear B CV regular, distant LUQ ostomy, abd surgical wounds dressed No edema  Impression and recommendations:  70 yo woman with a complicated CC illness, due to sepsis of intraabdominal source.  Tolerating rehab, PO diet. Placed red cap on 4/30 and tolerating this as well.   Plan keep the cap in place for another 24h. If no issues then would decannulate on 5/2.    [name], MD, PhD 04/08/2021, 8:15 AM [facility] [phone] or if no answer before 7:00PM call [phone] For any issues after 7:00PM please call eLink [phone]

## 2021-04-08 NOTE — Progress Notes (Signed)
Routine tracheostomy check by Respiratory. Pt in hallway with PT in wheelchair. Per PT's report, pt has cough continuous for close to an hour. Cough sounds dry, harsh. Pt says she is getting a lot of sputum up with it. On inspiratory, a mild forced stridor is heard. Pt coached on easy, slow and deep breaths and try not to cough so continuously trying to remove secretions to let her airway have a rest. Vitals WNL, SATs 97% on room air.

## 2021-04-08 NOTE — Progress Notes (Signed)
Pt called nurse to assess respiratory status. Breathing even, unlabored. Pt on RA at 99%, Trach capped. Pt stated that she had took a sip of ice tea with lemon and throat began to burn, which made her feel SOB. Pt states "I feel better now, but are they going to come check me". Nurse called resp therapy, but stated to monitor and call back if patient in any distress. Vitals WNL, and no distress at this time.

## 2021-04-09 ENCOUNTER — Other Ambulatory Visit: Payer: Self-pay

## 2021-04-09 DIAGNOSIS — N1831 Chronic kidney disease, stage 3a: Secondary | ICD-10-CM

## 2021-04-09 LAB — CBC WITH DIFFERENTIAL/PLATELET
Abs Immature Granulocytes: 0.05 10*3/uL (ref 0.00–0.07)
Basophils Absolute: 0.1 10*3/uL (ref 0.0–0.1)
Basophils Relative: 1 %
Eosinophils Absolute: 0.1 10*3/uL (ref 0.0–0.5)
Eosinophils Relative: 1 %
HCT: 24.4 % — ABNORMAL LOW (ref 36.0–46.0)
Hemoglobin: 7.9 g/dL — ABNORMAL LOW (ref 12.0–15.0)
Immature Granulocytes: 1 %
Lymphocytes Relative: 28 %
Lymphs Abs: 2 10*3/uL (ref 0.7–4.0)
MCH: 30.5 pg (ref 26.0–34.0)
MCHC: 32.4 g/dL (ref 30.0–36.0)
MCV: 94.2 fL (ref 80.0–100.0)
Monocytes Absolute: 0.7 10*3/uL (ref 0.1–1.0)
Monocytes Relative: 10 %
Neutro Abs: 4.3 10*3/uL (ref 1.7–7.7)
Neutrophils Relative %: 59 %
Platelets: 220 10*3/uL (ref 150–400)
RBC: 2.59 MIL/uL — ABNORMAL LOW (ref 3.87–5.11)
RDW: 14.3 % (ref 11.5–15.5)
WBC: 7.2 10*3/uL (ref 4.0–10.5)
nRBC: 0 % (ref 0.0–0.2)

## 2021-04-09 LAB — BASIC METABOLIC PANEL
Anion gap: 9 (ref 5–15)
BUN: 19 mg/dL (ref 8–23)
CO2: 28 mmol/L (ref 22–32)
Calcium: 8.5 mg/dL — ABNORMAL LOW (ref 8.9–10.3)
Chloride: 102 mmol/L (ref 98–111)
Creatinine, Ser: 1.04 mg/dL — ABNORMAL HIGH (ref 0.44–1.00)
GFR, Estimated: 58 mL/min — ABNORMAL LOW (ref >60)
Glucose, Bld: 92 mg/dL (ref 70–99)
Potassium: 3.7 mmol/L (ref 3.5–5.1)
Sodium: 139 mmol/L (ref 135–145)

## 2021-04-09 LAB — GLUCOSE, CAPILLARY
Glucose-Capillary: 92 mg/dL (ref 70–99)
Glucose-Capillary: 90 mg/dL (ref 70–99)
Glucose-Capillary: 152 mg/dL — ABNORMAL HIGH (ref 70–99)

## 2021-04-09 NOTE — Progress Notes (Signed)
Desiree Madden PHYSICAL MEDICINE & REHABILITATION PROGRESS NOTE  Subjective/Complaints: Patient seen sitting up in her chair this morning.  She states she slept well overnight.  She is tolerating capping.  She is hopeful about decannulation.  She was seen by pulmonology yesterday, notes reviewed-tolerating capping.  ROS: Denies CP, SOB, N/V/D  Objective: Vital Signs: Blood pressure 114/70, pulse 76, temperature 98.4 F (36.9 C), temperature source Oral, resp. rate 18, weight 57.8 kg, SpO2 97 %. No results found. Recent Labs    04/09/21 0624  WBC 7.2  HGB 7.9*  HCT 24.4*  PLT 220   Recent Labs    04/09/21 0624  NA 139  K 3.7  CL 102  CO2 28  GLUCOSE 92  BUN 19  CREATININE 1.04*  CALCIUM 8.5*    Intake/Output Summary (Last 24 hours) at 04/09/2021 1605 Last data filed at 04/09/2021 0356 Gross per 24 hour  Intake 60 ml  Output 81 ml  Net -21 ml        Physical Exam: BP 114/70 (BP Location: Right Arm)   Pulse 76   Temp 98.4 F (36.9 C) (Oral)   Resp 18   Wt 57.8 kg   SpO2 97%   BMI 21.86 kg/m   Constitutional: No distress . Vital signs reviewed. HENT: Normocephalic.  Atraumatic. Poor dentition Neck: #6 cuffed trach, capped Eyes: EOMI. No discharge. Cardiovascular: No JVD.  RRR. Respiratory: Normal effort.  No stridor.  Bilateral clear to auscultation. GI: Non-distended.  BS +.  + Back. Left lower quadrant ostomy Skin: Warm and dry.  Intact. Psych: Normal mood.  Normal behavior. Musc: No edema in extremities.  No tenderness in extremities. Neuro: Alert Motor: Grossly 4+/5 throughout, except for left lower extremity dorsiflexion, stable  Assessment/Plan: 1. Functional deficits which require 3+ hours per day of interdisciplinary therapy in a comprehensive inpatient rehab setting.  Physiatrist is providing close team supervision and 24 hour management of active medical problems listed below.  Physiatrist and rehab team continue to assess barriers to  discharge/monitor patient progress toward functional and medical goals   Care Tool:  Bathing    Body parts bathed by patient: Right arm,Left arm,Chest,Right upper leg,Left upper leg,Face,Front perineal area,Buttocks   Body parts bathed by helper: Right lower leg,Left lower leg Body parts n/a: Abdomen   Bathing assist Assist Level: Minimal Assistance - Patient > 75%     Upper Body Dressing/Undressing Upper body dressing   What is the patient wearing?: Pull over shirt    Upper body assist Assist Level: Supervision/Verbal cueing    Lower Body Dressing/Undressing Lower body dressing      What is the patient wearing?: Underwear/pull up     Lower body assist Assist for lower body dressing: Contact Guard/Touching assist     Toileting Toileting    Toileting assist Assist for toileting: Moderate Assistance - Patient 50 - 74% (LB clothing management in standing)     Transfers Chair/bed transfer  Transfers assist     Chair/bed transfer assist level: Minimal Assistance - Patient > 75% Chair/bed transfer assistive device: Walker,Armrests   Locomotion Ambulation   Ambulation assist      Assist level: Minimal Assistance - Patient > 75% (+2 w/c follow) Assistive device: Walker-rolling Max distance: 14ft   Walk 10 feet activity   Assist     Assist level: Minimal Assistance - Patient > 75% Assistive device: Walker-rolling   Walk 50 feet activity   Assist Walk 50 feet with 2 turns activity did not occur:  Safety/medical concerns  Assist level: Minimal Assistance - Patient > 75% Assistive device: Walker-rolling    Walk 150 feet activity   Assist Walk 150 feet activity did not occur: Safety/medical concerns         Walk 10 feet on uneven surface  activity   Assist Walk 10 feet on uneven surfaces activity did not occur: Safety/medical concerns         Wheelchair     Assist Will patient use wheelchair at discharge?: No   Wheelchair  activity did not occur: N/A         Wheelchair 50 feet with 2 turns activity    Assist    Wheelchair 50 feet with 2 turns activity did not occur: N/A       Wheelchair 150 feet activity     Assist  Wheelchair 150 feet activity did not occur: N/A        Medical Problem List and Plan: 1.  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   Continue CIR 2.  Antithrombotics: Continue Eliquis             -antiplatelet therapy: N/A 3. Pain Management: Decrease Oxcodone to q12H prn.   Controlled with meds on 5/2 4. Mood: LCSW to follow for evaluation and support.              -antipsychotic agents: N/A 5. Neuropsych: This patient is not fully cable of making decisions on her own behalf. 6. Skin/Wound Care: Routine pressure relief measures.  7. Fluids/Electrolytes/Nutrition: Monitor I/Os.             Continue Vitamin C/Multivitamin 8. Perforated diverticulitis s/p sigmoid colectomy w/end colostomy: WOC following to education.   Continue senna 9. NSTEMI/ICM/Chronic combined CHF: Compensated. On low dose Toprol XL Filed Weights   04/07/21 0450 04/08/21 0700 04/09/21 0402  Weight: 62.2 kg 59.1 kg 57.8 kg   Improving on 5/2 10. SVT/PAF: Monitor HR tid--continue amiodarone, toprol and Eliquis.  Controlled on 5/2, continue to monitor  Monitor increase activity 11. VDRF s/p trach: Monitor respiratory status  Continue cuffed #6 shiley             Continue capping, will follow up regarding decannulation 12. Hypokalemia/Hypomagnesemia: Due to malnutrition/critical illness             Monitor and supplement as indicated.             Continue supplements to help with nutritional state.             Continue Marinol-->monitor for SE.   Potassium 3.75/2  Magnesium 2.0 on 4/23 13. Situational depression: Continue Remeron for smood stabilization              --Xanax prn to manage anxiety.  14.  Cystopexy/Urinary retention:   Added flomax             Appears to be improving 15. Chronic insomnia: Continue Xanax prn. Melatonin ineffective.              --Remeron  16. Left foot drop   PRAFO 17.  Dysphagia  D2 thins  Monitor p.o. intake  Continue to advance diet as tolerated 18.  CKD stage II/III  Creatinine 1.04 on 5/2  Continue to monitor  LOS: 6 days A FACE TO FACE EVALUATION WAS PERFORMED  Desiree Madden Lorie Phenix 04/09/2021, 4:05 PM

## 2021-04-09 NOTE — Procedures (Signed)
Patient has tolerated capping trials well  The existing tracheostomy was removed.  Occlusive dressing placed.  Patient instructed on proper splinting of stoma for cough and phonation.  Keep this dressing for 48 hours, if becomes saturated okay to change.  After 48 hours change daily and as needed.  Would keep occlusive dressing or Band-Aid in place until stoma completely closed.  As mentioned in the progress note if stoma not closed in 7 to 10 days she would need a ENT evaluation  Erick Colace ACNP-BC [place] Pager # [phone] OR # [phone] if no answer

## 2021-04-09 NOTE — Progress Notes (Signed)
Speech Language Pathology Daily Session Note  Patient Details  Name: Desiree Madden MRN: [id number] Date of Birth: [birth year]  Today's Date: 04/09/2021 SLP Individual Time: 2608-8835 SLP Individual Time Calculation (min): 40 min  Short Term Goals: Week 1: SLP Short Term Goal 1 (Week 1): Patient will donn/doff PMSV with supervision verbal cues. SLP Short Term Goal 1 - Progress (Week 1): Met SLP Short Term Goal 2 (Week 1): Patient will consume current diet with minimal overt s/s of aspiration with supervision verbal cues for useof swallowing compensatory strategies.  Skilled Therapeutic Interventions:Skilled ST services focused on swallow skills. Pt was decannulated this am and tolerating respiration/verbalization and swallowing without trach. SLP facilitated PO consumed dys 3 texture snack, pt demonstrated mod I use of liquid wash to aid in softening textures dur to poor dentition. Pt demonstrated timely mastication, timely swallow initiation with no overt s/s aspiration. SLP will plan for dys 3 texture lunch trial tray for tomorrows session, pt expressed this was her baseline diet due to poor dentition. Pt consumed dys 2 textures and thin liquid lunch tray (current diet), pt demonstrated appropriate mastication timing, oral clearance, swallow appeared timely and no overt s/s aspiration with mod I use of small bites/sips. Pt was left in room with call bell within reach and chair alarm set. SLP recommends to continue skilled services.     Pain Pain Assessment Pain Scale: 0-10 Pain Score: 8  Faces Pain Scale: Hurts whole lot Pain Type: Acute pain Pain Location: Abdomen Pain Orientation: Right;Medial Pain Descriptors / Indicators: Aching  Therapy/Group: Individual Therapy  Aizley Stenseth  [facility] 04/09/2021, 11:51 AM

## 2021-04-09 NOTE — Consult Note (Signed)
[place] Nurse ostomy follow up Stoma type/location: LUQ colostomy Stomal assessment/size: 1 1/4" pink moist and producing thick brown stool. Well budded.  Stool softeners have been increased.  Patient encouraged to drink fluids.  Lurline Idol was removed this morning.  Peristomal assessment: Creasing at 4 and 8 o'clock Treatment options for stomal/peristomal skin: barrier ring, 2 piece flat pouch Output thick brown stool Ostomy pouching: 2pc.  2 1/4" pouch with barrier ring Education provided: Patient sits up on side of bed and completes pouch change with assistance.  We empty the pouch first.  Due to thickness of stool, cleaning the flap closure is difficult.  We practice on the clean pouch as well.  She removes old pouch, cleanses skin. She measures stoma and applies barrier ring.  She is able to cut the pouch to 1 1/4" opening and assemble pouch and barrier.  She removes backing and applies pouch, smoothing and sealing pouch.  She rolls pouch closed and we simulate emptying with a clean pouch.  She is feeling more confident in ostomy care.  Enrolled patient in [place] Start Discharge program: Yes will today.  [place] Nurse wound follow up Wound type:midline surgical wounds, granulating nicely.  Measurement:5 cm x 1 cm x 1 cm proximal end, 7 cm x 4 cm 2 cm distal end. Intact skin in between (umbilicus)  PRtected with barrier ring.  Wound XKP:VVZSM red and granulating.  Drainage (amount, consistency, odor) minimal serosanguinous  Periwound:LUQ colostomy, the 2 dressings do not overlap.  Dressing procedure/placement/frequency: Barrier rings at 3 and 9 o'clock due to creasing/abdominal fold.  2 pieces black foam with intact skin protected in between .  Cover with drape and seal immediately achieved.  Change MOnday/Wed/Fri.  Will follow.  Domenic Moras MSN, RN, FNP-BC [facility] Wound, Ostomy, Continence Nurse Pager [phone]

## 2021-04-09 NOTE — Progress Notes (Signed)
Physical Therapy Session Note  Patient Details  Name: Desiree Madden MRN: [id number] Date of Birth: 1951-04-16  Today's Date: 04/09/2021 PT Individual Time: 1330-1430 PT Individual Time Calculation (min): 60 min   Short Term Goals: Week 1:  PT Short Term Goal 1 (Week 1): Pt will complete bed mobility with CGA PT Short Term Goal 2 (Week 1): Pt will perform bed<>chair transfers with minA and LRAD PT Short Term Goal 3 (Week 1): Pt will ambulate 47ft with minA and LRAD PT Short Term Goal 4 (Week 1): Pt will initiate stair training  Skilled Therapeutic Interventions/Progress Updates:  Patient seated upright in w/c on entrance to room. Patient alert and agreeable to PT session. Patient denied pain during session.  Therapeutic Activity: Bed Mobility: Patient performed sit--> supine with Min A for BLE. VC/ tc required for technique. Transfers: Patient performed STS and SPVT transfers throughout session using RW and with supervision/ CGA. Provided verbal cues for push-to-stand technique.  Gait Training:  Patient taken to therapy gym to locate AFO for proper fitting to pt. First AFO with lateral ankle support initially comfortable and pt able to ambulate using RW and CGA with w/c follow for fatigue. She is able to reach 120 feet prior to request to sit and relating discomfort with AFO. Eversion noted with AFO use and heel has come out of shoe during ambulation. Anterior shin support AFO provided to pt and able to ambulate 55 feet and relates improvement in comfort. Pt also demos improved ability to bring foot flat during foot strike with reduced eversion noted. Pt informed that this is not the correct AFO for her but as options are limited in her size, comfort will be preferred. AFO placed in bag on back of w/c for continued use in therapy.   Patient supine in bed at end of session with brakes locked, bed alarm set, and all needs within reach.  Therapy Documentation Precautions:   Precautions Precautions: Fall,Other (comment) Precaution Comments: Abdominal wound vac. LLQ ostomy. L foot drop Restrictions Weight Bearing Restrictions: No Vital Signs: Therapy Vitals Temp: 98.4 F (36.9 C) Temp Source: Oral Pulse Rate: 76 Resp: 18 BP: 114/70 Patient Position (if appropriate): Lying Oxygen Therapy SpO2: 97 % O2 Device: Room Air  Therapy/Group: Individual Therapy  Alger Simons PT, DPT 04/09/2021, 6:05 PM

## 2021-04-09 NOTE — Progress Notes (Signed)
Pt slept fairly well throughout the night. No issues with breathing, no desaturations. Pt noted to have coughed cap off trach, cap reapplied. Pt in no distress. No c/o pain or discomfort. Pt voiding without difficulty. Wound vac intact and patent.

## 2021-04-09 NOTE — Progress Notes (Signed)
Occupational Therapy Session Note  Patient Details  Name: Desiree Madden MRN: [id number] Date of Birth: 22-Feb-1951  Today's Date: 04/09/2021 OT Individual Time: 2257-5051 OT Individual Time Calculation (min): 60 min    Short Term Goals: Week 1:  OT Short Term Goal 1 (Week 1): Pt will be able to sit to EOB with S to prepare for self care. OT Short Term Goal 2 (Week 1): Pt will be able to rise to stand with min A to prepare for LB self care. OT Short Term Goal 3 (Week 1): Pt will be able to stand with min A while pulling pants over hips. OT Short Term Goal 4 (Week 1): Pt will complete transfer to toilet with min A.  Skilled Therapeutic Interventions/Progress Updates:    Pt seen this session for BADL of washing hair, bathing and dressing at sink level.  Pt did well coming to EOB with S, sit to stand with RW to min A and stepped to w/c min A.  Pt did well with self care only needing CGA in standing to wash bottom and slight A to get underwear over feet. She may need a reacher.  Pt continues to be anxious and often distracts herself.  Overall she did participate well. Resting in wc with chair pad alarm on.    Therapy Documentation Precautions:  Precautions Precautions: Fall,Other (comment) Precaution Comments: Abdominal wound vac. LLQ ostomy. L foot drop Restrictions Weight Bearing Restrictions: No  Pain: no c/o pain during session   ADL: ADL Eating: Supervision/safety Grooming: Supervision/safety Upper Body Bathing: Supervision/safety Where Assessed-Upper Body Bathing: Edge of bed Lower Body Bathing: Moderate assistance Where Assessed-Lower Body Bathing: Bed level Upper Body Dressing: Supervision/safety Where Assessed-Upper Body Dressing: Edge of bed Lower Body Dressing: Maximal assistance Where Assessed-Lower Body Dressing: Bed level Toileting: Not assessed Toilet Transfer: Not assessed   Therapy/Group: Individual Therapy  [place] 04/09/2021, 12:48 PM

## 2021-04-09 NOTE — Progress Notes (Signed)
  Subjective    Seems to be tolerating capping. Not required sxn.   Objective   Blood Pressure 105/74 (BP Location: Left Arm)   Pulse 80   Temperature 98.5 F (36.9 C) (Oral)   Respiration 20   Weight 57.8 kg   Oxygen Saturation 96%   Body Mass Index 21.86 kg/m   Exam    General: 39 old female patient currently sitting up in chair she is in no acute distress phonation quality is excellent with occlusive tracheostomy HEENT normocephalic atraumatic mucous membranes moist trach stoma unremarkable has cuffed trach which is deflated.  Size 6.  Occlusive capping placed phonation quality excellent no stridor Pulmonary: Some scattered rhonchi that clear with cough no accessory use currently on room air Cardiac regular rate and rhythm Abdomen soft not tender Extremities warm and dry Neuro intact  Impression/plan   Impression and recommendations:  Trach dependent s/p  complicated CC illness, due to sepsis of intraabdominal source.   Discussion Tolerating capping trials.   Plan decannluate Keep stoma dressing in place for 24 -48 hrs then OK to just change daily/PRN w/ regular band aid  If stoma does not close in ~10d needs ENT consult for tracheocutaneous fistula closure.Marland Kitchen   Erick Colace ACNP-BC [place] Pager # [phone] OR # [phone] if no answer

## 2021-04-09 NOTE — Progress Notes (Signed)
Physical Therapy Session Note  Patient Details  Name: Scotland Dost MRN: [id number] Date of Birth: 1951/05/06  Today's Date: 04/09/2021 PT Individual Time: 0800-0855 PT Individual Time Calculation (min): 55 min   Short Term Goals: Week 1:  PT Short Term Goal 1 (Week 1): Pt will complete bed mobility with CGA PT Short Term Goal 2 (Week 1): Pt will perform bed<>chair transfers with minA and LRAD PT Short Term Goal 3 (Week 1): Pt will ambulate 69ft with minA and LRAD PT Short Term Goal 4 (Week 1): Pt will initiate stair training  Skilled Therapeutic Interventions/Progress Updates:     Pt greeted supine in bed, awake and agreeable to therapy. No reports of pain at start of session. Pt remain anxious regarding mobility and lines/leads. She also has several questions regarding her trach as she was told this would be removed this morning. Trach capped throughout session and SPO2 >95% with gait and functional mobility. Extra time needed at start of session for redirection and encouragement for PT participation at pt requesting to hold therapy until trach removed.  Supine<>sit supervision with HOB fully elevated, needs assist for line management (wound vac and telemetry). Pt reporting need to void. Sit<>stand with CGA to RW with cues needed for hand placement and monitoring L foot placement. She ambulated short distances within her room with CGA and RW - cues for increasing L heel strike and L foot clearance. Pt able to manage lower body dressing in standing with minA for balance. Pt continent of bladder on BSC in bathroom. Sit<>Stand with CGA to RW and ambulated sinkside with CGA and RW and similar cues as above. Pt washed her hands and brushed her teeth with CGA for balance but noted she would lean quite heavily with hips to sink and leaning forward with elbows on sink for balance.   W/c transport for time management downstairs to 70M therapy gym.  L heel cord stretch provided while seated in w/c with  posterior talocrual joint mobs as well to improve ankle DF ROM -  Pt subjectively feeling improvement as well as increased "readiness" to ambulate.   Gait training 2x81ft (seated rest) with CGA and RW - initially demo's step-to gait pattern and poor L foot clearance, needs mod cues throughout to promote step-through gait and L heel strike. Will need AFO consult due to L foot drop.  Pt returned to her room with totalA upstairs (via elevator) and pt remained seated in w/c at end of session with needs in reach. Pt requesting pain medication - RN made aware of pain needs as well as need for chair alarm.   Therapy Documentation Precautions:  Precautions Precautions: Fall,Other (comment) Precaution Comments: trach capped 4/30, Abdominal wound vac. LLQ ostomy. L foot drop Restrictions Weight Bearing Restrictions: No General:     Therapy/Group: Individual Therapy  Ceanna Wareing P Andrzej Scully PT 04/09/2021, 7:42 AM

## 2021-04-10 DIAGNOSIS — F411 Generalized anxiety disorder: Secondary | ICD-10-CM

## 2021-04-10 LAB — GLUCOSE, CAPILLARY
Glucose-Capillary: 78 mg/dL (ref 70–99)
Glucose-Capillary: 93 mg/dL (ref 70–99)
Glucose-Capillary: 116 mg/dL — ABNORMAL HIGH (ref 70–99)
Glucose-Capillary: 98 mg/dL (ref 70–99)

## 2021-04-10 MED ORDER — CHLORHEXIDINE GLUCONATE 0.12 % MT SOLN
15.0000 mL | Freq: Four times a day (QID) | OROMUCOSAL | Status: DC
  Administered 2021-04-10 – 2021-04-16 (×22): 15 mL via OROMUCOSAL
  Filled 2021-04-10 (×21): qty 15

## 2021-04-10 NOTE — Progress Notes (Signed)
Speech Language Pathology Daily Session Note  Patient Details  Name: Desiree Madden MRN: [id number] Date of Birth: [birth year]  Today's Date: 04/10/2021 SLP Individual Time: 1203-1230 SLP Individual Time Calculation (min): 27 min  Short Term Goals: Week 1: SLP Short Term Goal 1 (Week 1): Patient will donn/doff PMSV with supervision verbal cues. SLP Short Term Goal 1 - Progress (Week 1): Met SLP Short Term Goal 2 (Week 1): Patient will consume current diet with minimal overt s/s of aspiration with supervision verbal cues for useof swallowing compensatory strategies.   Skilled Therapeutic Interventions:Skilled ST services focused on swallow skills. Pt consumed dys 3 textures and thin liquid lunch, upgraded today with mod I for small bites and sips. SLP will plan to upgrade diet to regular texture tomorrow and education on selecting soft foods due to time restriction and then d/c from [facility] services. Pt was left in room with call bell within reach and chair alarm set. SLP recommends to continue skilled services.      Pain Pain Assessment Pain Score: 0-No pain  Therapy/Group: Individual Therapy  Tennyson Kallen  [facility] 04/10/2021, 12:42 PM

## 2021-04-10 NOTE — Progress Notes (Signed)
Occupational Therapy Session Note  Patient Details  Name: Desiree Madden MRN: [id number] Date of Birth: Apr 29, 1951  Today's Date: 04/10/2021 OT Individual Time: 0830-0900 OT Individual Time Calculation (min): 30 min    Short Term Goals: Week 1:  OT Short Term Goal 1 (Week 1): Pt will be able to sit to EOB with S to prepare for self care. OT Short Term Goal 2 (Week 1): Pt will be able to rise to stand with min A to prepare for LB self care. OT Short Term Goal 3 (Week 1): Pt will be able to stand with min A while pulling pants over hips. OT Short Term Goal 4 (Week 1): Pt will complete transfer to toilet with min A.  Skilled Therapeutic Interventions/Progress Updates:    Pt scheduled for a short 30 min session, but she was able to complete toileting, bathing, dressing and even hairwashing in a very timely manner. Pt was less anxious and therefore better able to organize herself and stay on task.  Pt transferred to bed at end of session. Bed alarm on and all needs met.    ADL Eating: Supervision/safety Grooming: Supervision/safety Upper Body Bathing: Supervision/safety Where Assessed-Upper Body Bathing: Sitting at sink Lower Body Bathing: Moderate assistance Where Assessed-Lower Body Bathing: Sitting at sink,Standing at sink Upper Body Dressing: Setup Where Assessed-Upper Body Dressing: Wheelchair Lower Body Dressing: Contact guard Where Assessed-Lower Body Dressing: Wheelchair,Standing at sink,Sitting at sink Toileting: Contact guard Where Assessed-Toileting: Editor, commissioning Method: Arts development officer: Grab bars  Therapy Documentation Precautions:  Precautions Precautions: Fall,Other (comment) Precaution Comments: Abdominal wound vac. LLQ ostomy. L foot drop Restrictions Weight Bearing Restrictions: No     Pain: Pain Assessment Pain Scale: 0-10 Pain Score: 8  Pain Type: Acute pain Pain Location: Abdomen Pain  Orientation: Right;Medial Pain Descriptors / Indicators: Aching Pain Frequency: Intermittent Patients Stated Pain Goal: 3 Pain Intervention(s): Medication (See eMAR)   Therapy/Group: Individual Therapy  Godson Pollan 04/10/2021, 12:18 PM

## 2021-04-10 NOTE — Progress Notes (Signed)
Physical Therapy Session Note  Patient Details  Name: Desiree Madden MRN: [id number] Date of Birth: [birth year]  Today's Date: 04/10/2021 PT Individual Time: 1002-1030 + 1400 - 1454 PT Individual Time Calculation (min): 28 min  + 54 min  Short Term Goals: Week 1:  PT Short Term Goal 1 (Week 1): Pt will complete bed mobility with CGA PT Short Term Goal 2 (Week 1): Pt will perform bed<>chair transfers with minA and LRAD PT Short Term Goal 3 (Week 1): Pt will ambulate 47ft with minA and LRAD PT Short Term Goal 4 (Week 1): Pt will initiate stair training  Skilled Therapeutic Interventions/Progress Updates:     1st session: Pt greeted supine in bed at start of session. Agreeable to therapy with no reports of pain. Pt now with trach removed. She is anxious with wound care and has several questions about this. RN arriving who made her aware of her wound care schedule which appeared to improve her anxiety level. Supine<>sit with supervision with use of bed features. Donned L AFO (GRAFO) with totalA and tennis shoes with totalA for time management.   Sit<>Stand with minA to RW with assist needing for powering to rise and pt relying on the back of her legs to assist. Gait training ~72ft + ~126ft with CGA and RW. 1st gait trial with L AFO on but this was removed for the 2nd trial as pt c/o lateral ankle pain with brace was digging into the skin (lateral strut). Mild redness noted when removed but skin c/d/i. 2nd gait trial needed cues for increased L foot clearance and awareness of foot placement. She had occasional intermittent toe drag but nothing causing her to loose her balance.   Completed heel raises in standing (2x15) with RW support and needing therapist assist for stabilizing L ankle to prevent eversion and ankle rolling. Pt agreeable to remain seated EOB at end of session - reminded her of the "call don't fall" policy and pt understanding. Bed alarm on, needs within reach, door open.  2nd  session: Pt received sitting upright in w/c, agreeable to therapy session. No reports of pain. W/c transport for time management downstairs to 55M gym.   Provided L heel cord stretch while she sat in w/c - incorporated posterior mobilizations, grade 3, of talocrural joint to assist with maximizing DF ROM.   Ace wrapped L foot for DF assist. Gait training ~165ft with CGA and RW - cues for increasing awareness of L foot placement, increased L heel strike, and clearing L foot - pt sometimes over compensates with excessive hip/knee flexion during swing impacting her gait cycle.   Instructed on static standing on wedge with minA for balance and unilateral support with emphasis on L heel cord stretch and forward/backward weight shifts in sagittal plane - ROM to tolerance and pt subjectively reporting improvement.   In // bars, performed the following standing there-ex with BUE support to bar: -1x20 hip abduction, bilaterally (2.5# ankle weight to LLE) -1x15 hamstring curls, bilaterally (unweighted) -1x15 high marches, bilaterally (unweighted) -1x20 side stepping L<>R with CGA - emphasizing L foot clearance/placement to reduce risk of rolling ankle  Pt wheeled back upstairs to her room with totalA for time management. She was agreeable to remain seated in w/c at end of session. NT arriving to assist with ordering her dinner. Pt also requesting to use bathroom with NT assist - Handoff of care.  Therapy Documentation Precautions:  Precautions Precautions: Fall,Other (comment) Precaution Comments: Abdominal wound vac. LLQ ostomy. L foot  drop Restrictions Weight Bearing Restrictions: No General:    Therapy/Group: Individual Therapy  Marieli Rudy P Faithlynn Deeley PT 04/10/2021, 7:40 AM

## 2021-04-10 NOTE — Progress Notes (Signed)
[place] PHYSICAL MEDICINE & REHABILITATION PROGRESS NOTE  Subjective/Complaints: Patient seen sitting up in bed this morning.  She states she slept well overnight.  She states she feels much better without her tracheostomy in place.  She notes some discomfort at her wound VAC site.  She was seen by pulmonology yesterday, notes reviewed-decannulated.  She states she is working on left foot activation with therapies.  ROS: Denies CP, SOB, N/V/D  Objective: Vital Signs: Blood pressure 110/64, pulse 70, temperature 98.4 F (36.9 C), temperature source Oral, resp. rate 17, weight 62.3 kg, SpO2 95 %. No results found. Recent Labs    04/09/21 0624  WBC 7.2  HGB 7.9*  HCT 24.4*  PLT 220   Recent Labs    04/09/21 0624  NA 139  K 3.7  CL 102  CO2 28  GLUCOSE 92  BUN 19  CREATININE 1.04*  CALCIUM 8.5*    Intake/Output Summary (Last 24 hours) at 04/10/2021 1123 Last data filed at 04/10/2021 0748 Gross per 24 hour  Intake 200 ml  Output 0 ml  Net 200 ml        Physical Exam: BP 110/64   Pulse 70   Temp 98.4 F (36.9 C) (Oral)   Resp 17   Wt 62.3 kg   SpO2 95%   BMI 23.56 kg/m   Constitutional: No distress . Vital signs reviewed. HENT: Normocephalic.  Atraumatic. Poor dentition Neck: + Stoma Eyes: EOMI. No discharge. Cardiovascular: No JVD.  RRR. Respiratory: Normal effort.  No stridor.  Bilateral clear to auscultation. GI: Non-distended.  BS +. Left lower quadrant ostomy. Skin: Warm and dry.  Stoma with dressing CDI. Psych: Normal mood.  Normal behavior. Musc: No edema in extremities.  No tenderness in extremities. Neuro: Alert Motor: Grossly 4+/5 throughout, except for left lower extremity dorsiflexion, unchanged  Assessment/Plan: 1. Functional deficits which require 3+ hours per day of interdisciplinary therapy in a comprehensive inpatient rehab setting.  Physiatrist is providing close team supervision and 24 hour management of active medical problems  listed below.  Physiatrist and rehab team continue to assess barriers to discharge/monitor patient progress toward functional and medical goals   Care Tool:  Bathing    Body parts bathed by patient: Right arm,Left arm,Chest,Right upper leg,Left upper leg,Face,Front perineal area,Buttocks   Body parts bathed by helper: Right lower leg,Left lower leg Body parts n/a: Abdomen   Bathing assist Assist Level: Minimal Assistance - Patient > 75%     Upper Body Dressing/Undressing Upper body dressing   What is the patient wearing?: Pull over shirt    Upper body assist Assist Level: Supervision/Verbal cueing    Lower Body Dressing/Undressing Lower body dressing      What is the patient wearing?: Underwear/pull up     Lower body assist Assist for lower body dressing: Contact Guard/Touching assist     Toileting Toileting    Toileting assist Assist for toileting: Moderate Assistance - Patient 50 - 74% (LB clothing management in standing)     Transfers Chair/bed transfer  Transfers assist     Chair/bed transfer assist level: Minimal Assistance - Patient > 75% Chair/bed transfer assistive device: Walker,Armrests   Locomotion Ambulation   Ambulation assist      Assist level: Minimal Assistance - Patient > 75% (+2 w/c follow) Assistive device: Walker-rolling Max distance: 68ft   Walk 10 feet activity   Assist     Assist level: Minimal Assistance - Patient > 75% Assistive device: Walker-rolling   Walk 50  feet activity   Assist Walk 50 feet with 2 turns activity did not occur: Safety/medical concerns  Assist level: Minimal Assistance - Patient > 75% Assistive device: Walker-rolling    Walk 150 feet activity   Assist Walk 150 feet activity did not occur: Safety/medical concerns         Walk 10 feet on uneven surface  activity   Assist Walk 10 feet on uneven surfaces activity did not occur: Safety/medical concerns          Wheelchair     Assist Will patient use wheelchair at discharge?: No   Wheelchair activity did not occur: N/A         Wheelchair 50 feet with 2 turns activity    Assist    Wheelchair 50 feet with 2 turns activity did not occur: N/A       Wheelchair 150 feet activity     Assist  Wheelchair 150 feet activity did not occur: N/A        Medical Problem List and Plan: 1.  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   Continue CIR 2.  Antithrombotics: Continue Eliquis             -antiplatelet therapy: N/A 3. Pain Management: Decrease Oxcodone to q12H prn.   Controlled with meds on 5/3 4. Mood: LCSW to follow for evaluation and support.              -antipsychotic agents: N/A 5. Neuropsych: This patient is not fully cable of making decisions on her own behalf. 6. Skin/Wound Care: Routine pressure relief measures.  7. Fluids/Electrolytes/Nutrition: Monitor I/Os.             Continue Vitamin C/Multivitamin 8. Perforated diverticulitis s/p sigmoid colectomy w/end colostomy: WOC following to education.   Continue senna 9. NSTEMI/ICM/Chronic combined CHF: Compensated. On low dose Toprol XL Filed Weights   04/08/21 0700 04/09/21 0402 04/10/21 0448  Weight: 59.1 kg 57.8 kg 62.3 kg   ?  Reliability on 5/3 10. SVT/PAF: Monitor HR tid--continue amiodarone, toprol and Eliquis.  Controlled on 5/3, continue to monitor  Monitor increase activity 11. VDRF s/p trach: Monitor respiratory status  Continue cuffed #6 shiley  Decannulated without issue on 5/3 12. Hypokalemia/Hypomagnesemia: Due to malnutrition/critical illness             Monitor and supplement as indicated.             Continue supplements to help with nutritional state.             Continue Marinol-->monitor for SE.   Potassium 3.7 on 5/2  Magnesium 2.0 on 4/23 13. Situational depression: Continue Remeron for smood  stabilization              --Xanax prn to manage anxiety.  14. Cystopexy/Urinary retention:   Added flomax             Improving 15. Chronic insomnia: Continue Xanax prn. Melatonin ineffective.              --Remeron  16. Left foot drop   PRAFO 17.  Dysphagia  D3 thins  Monitor p.o. intake  Continue to advance diet as tolerated 18.  CKD stage II/III  Creatinine 1.04 on 5/2, plan to order labs for later this week  Continue to monitor  LOS: 7 days A FACE TO FACE EVALUATION WAS PERFORMED  Chimaobi Casebolt Lorie Phenix 04/10/2021, 11:23 AM

## 2021-04-10 NOTE — Consult Note (Signed)
Neuropsychological Consultation   Patient:   Desiree Madden   DOB:   Jan 29, 1951  MR Number:  [id number]  Location:  [facility] [id number] [place] [id number] Dept: [place]: [phone]           Date of Service:   04/10/2021  Start Time:   1 PM End Time:   2 PM  Provider/Observer:  Ilean Skill, Psy.D.       Clinical Neuropsychologist       Billing Code/Service: [phone]  Chief Complaint:    Desiree Madden is a 70 year old female who has a past medical history including hypertension, ulcerative colitis colovesicular fistula, T11 compression fracture, diverticulitis.  Patient was admitted on 02/21/2021 with severe abdominal pain with concern over acute diverticulitis and large stool burden as well as moderately progressive severe T11 compression fracture.  Patient was started on antibiotics for UTI as well as diverticulitis.  Repeat CT of abdomen on 3/19 showed worsening of paracolic standing with substantial localized wall thickening along 9 cm suspicious for progressive diverticulitis and regional colitis.  General surgery was consulted patient developed fever with recurrent rise in white blood cell count and CT abdomen showed abscess with question of fistula formation.  Patient underwent interventions.  Postop course was significant for septic shock requiring prolonged intubation, mods, non-STEMI, severe anasarca and severe agitation due to delirium.  Encephalopathy has been resolving and the patient has made significant gains but was admitted to CIR due to cognitive difficulties and debility.  Patient has improved significantly for cognitive deficits and is continue to work on therapeutic interventions for functional decline.  Reason for Service:  Patient was referred for neuropsychological consultation due to coping adjustment with significant anxiety and fear of further medical  complications.  Below is the HPI for the current admission.  HPI: Desiree Madden is a 70 year old female with history of HTN, ulcerative colitis, colovesical fistula, T11 compression fracture, diveticulits who was admitted on 02/21/21 with severe abdominal pain concerning for acute diverticulitis and large stool burden seen in colon as well as moderately progressive severe T11 compression Fx. She was started on antibiotics for UTI as well as diverticulitis. GI  recommended supportive care and augmentation of bowel regimen.  She hs progressive leucocytosis and repeat CT abdomen 3/19 showing worsening of paracolic stranding with substantial localized wall thickening along 9 cm suspicious for progressive diverticulitis and regional colitis. Antibiotics switched to Zosyn with improvement in WBC. Bowel prep ordered with consideration of gastrografin enema for workup per Dr. Christian Mate (General Surgery). She developed fever with recurrent rise in WBC and CT abdomen showed abscess with question of fistula formation.  She underwent CT guided drainage of 100 cc fluid from LLQ abscess on 03/23 but continued to have rise in WBC with ongoing pain. She was taken to OR on 03/24 for Sigmoid colectomy with end colostomy, Hartman's procedure by Dr. Christian Mate and Cystorrhapy by Dr. Diamantina [facility].   Post op course significant for septic shock requiring prolonged intubation, MODS,  NSTEMI, severe anasarca and severe agitation due to delirium. Dr. Clayborn Bigness recommended conservative care with IV heparin once cleared by surgery. 2D echo showed EF 45-50% and Amiodarone added for episodes of wide complex tachycardia with PAF. She was transitioned to Eliquis.   She underwent tracheostomy by Dr. Richardson Landry on 03/31 and as mentation improved, tolerated extubation to ATC by 04/09.  She was started on dysphagia 1, nectar liquids dud to dysphagia.  Foley placed on 04/08 due to urinary retention with follow up cystogram in 2 weeks showing no evidence of  leak. She continued to have problems voiding with retention and foley was replaced on 04/24.  Encephalopathy is resolving but continues to have bouts of lethargy and poor intake. She was noted to have moderate to severe depression therefore Remeron added in addition to low dose marinol to stimulate appetite.  Therapy ongoing to address weakness due to debility as well as dysphagia. She is tolerating PMSV, is showing improvement in activity tolerance but noted to have cognitive deficits as well as anxiety with activity. CIR recommended due to functional decline.  Current Status:  The patient was alert and oriented sitting in her chair when I entered the room.  She was able to describe an adequate detail what had been going on medically but acknowledges not remembering a good portion of her early hospitalization.  The patient reports that she has been improving physically and her cognition is clearly improved and she was well oriented with good mental status.  The patient acknowledged significant anxiety around further complications medically.  This is a longstanding anxiety condition that was exacerbated with her significant medical difficulties and complications.  The patient also talked about other stressors including dental issues that cause her pain as well that were in the process of being addressed but this was stopped when she developed her most recent significant medical issues.  Behavioral Observation: Desiree Madden  presents as a 69 y.o.-year-old Right Caucasian Female who appeared her stated age. her dress was Appropriate and she was Well Groomed and her manners were Appropriate to the situation.  her participation was indicative of Appropriate and Attentive behaviors.  There were physical disabilities noted.  she displayed an appropriate level of cooperation and motivation.     Interactions:    Active Appropriate  Attention:   within normal limits and attention span and concentration were age  appropriate  Memory:   within normal limits; recent and remote memory intact  Visuo-spatial:  within normal limits  Speech (Volume):  normal  Speech:   normal; normal  Thought Process:  Coherent and Relevant  Though Content:  WNL; not suicidal and not homicidal  Orientation:   person, place, time/date and situation  Judgment:   Good  Planning:   Fair  Affect:    Anxious  Mood:    Anxious  Insight:   Good  Intelligence:   normal  Medical History:   Past Medical History:  Diagnosis Date  . Hypertension   . Thyroid disease          Patient Active Problem List   Diagnosis Date Noted  . Anxiety state   . Urinary retention   . Hypokalemia   . Tracheostomy dependent ([place])   . PAF (paroxysmal atrial fibrillation) ([place])   . Dysphagia   . Physical debility 04/03/2021  . Intensive care (ICU) myopathy 04/03/2021  . Left foot drop 04/03/2021  . Protein-calorie malnutrition, severe 03/13/2021  . Diverticulitis 02/21/2021  . Fistula, bladder 02/21/2021  . Ulcerative colitis ([place]) 02/21/2021  . Hyperlipidemia 02/21/2021  . Essential hypertension 02/21/2021  . Hypothyroidism 02/21/2021  . Abnormal ECG 02/21/2021  . CKD (chronic kidney disease), stage III ([place]) 02/21/2021  . Elevated lactic acid level 02/21/2021           Psychiatric History:  Patient has dealt with anxiety particular around medical issues for some time.  Family Med/Psych History:  Family History  Problem Relation Age  of Onset  . Arthritis Other   . CAD Mother     Impression/DX:  Curley Fayette is a 70 year old female who has a past medical history including hypertension, ulcerative colitis colovesicular fistula, T11 compression fracture, diverticulitis.  Patient was admitted on 02/21/2021 with severe abdominal pain with concern over acute diverticulitis and large stool burden as well as moderately progressive severe T11 compression fracture.  Patient was started on antibiotics for UTI as well as  diverticulitis.  Repeat CT of abdomen on 3/19 showed worsening of paracolic standing with substantial localized wall thickening along 9 cm suspicious for progressive diverticulitis and regional colitis.  General surgery was consulted patient developed fever with recurrent rise in white blood cell count and CT abdomen showed abscess with question of fistula formation.  Patient underwent interventions.  Postop course was significant for septic shock requiring prolonged intubation, mods, non-STEMI, severe anasarca and severe agitation due to delirium.  Encephalopathy has been resolving and the patient has made significant gains but was admitted to CIR due to cognitive difficulties and debility.  Patient has improved significantly for cognitive deficits and is continue to work on therapeutic interventions for functional decline.  Today we worked on coping and adjustment issues particular on her anxiety and concerns about her medical status.  Diagnosis:    Debility, Anxiety        Electronically Signed   _______________________ Ilean Skill, Psy.D. Clinical Neuropsychologist

## 2021-04-10 NOTE — Progress Notes (Signed)
Occupational Therapy Session Note  Patient Details  Name: Desiree Madden MRN: [id number] Date of Birth: 04-12-1951  Today's Date: 04/10/2021 OT Individual Time: 1113-1200 OT Individual Time Calculation (min): 47 min    Short Term Goals: Week 1:  OT Short Term Goal 1 (Week 1): Pt will be able to sit to EOB with S to prepare for self care. OT Short Term Goal 2 (Week 1): Pt will be able to rise to stand with min A to prepare for LB self care. OT Short Term Goal 3 (Week 1): Pt will be able to stand with min A while pulling pants over hips. OT Short Term Goal 4 (Week 1): Pt will complete transfer to toilet with min A.  Skilled Therapeutic Interventions/Progress Updates:    Treatment session with focus on LB dressing, education on AE, and functional mobility.  Pt received seated EOB awaiting therapist.  Pt reports bathing this AM, but agreeable to completing LB dressing as pt wearing oversized shirt.  Pt able to achieve figure 4 sitting position and able to don pants and socks in figure 4 with increased time.  Therapist educated on use of reacher, sock aid, and shoe funnel with pt reporting wanting to continue to utilize figure 4 as able for increased independence.  Pt reports need to toilet.  Completed sit > stand with Min assist with RW. Pt ambulated to toilet with RW with CGA and therapist managing wound vac.  Pt completed clothing management and hygiene with CGA with alternating UE support on RW.  Pt ambulated to sink to wash hands.  Pt returned to sitting in w/c per pt request.  Therapist educated on use of shoe funnel as pt with difficulty donning shoes, pt receptive to education but wanting to continue attempts with figure 4.  Pt remained upright in w/c with chair alarm on and all needs in reach awaiting SLP.  Therapy Documentation Precautions:  Precautions Precautions: Fall,Other (comment) Precaution Comments: Abdominal wound vac. LLQ ostomy. L foot drop Restrictions Weight Bearing  Restrictions: No General: General OT Amount of Missed Time: 13 Minutes Vital Signs: Therapy Vitals Temp: 97.8 F (36.6 C) Temp Source: Oral Pulse Rate: 81 Resp: 17 BP: 98/63 Patient Position (if appropriate): Sitting Oxygen Therapy SpO2: 100 % O2 Device: Room Air Pain: Pain Assessment Pain Score: 0-No pain   Therapy/Group: Individual Therapy  Simonne Come 04/10/2021, 1:53 PM

## 2021-04-11 ENCOUNTER — Inpatient Hospital Stay (HOSPITAL_COMMUNITY): Payer: Medicare HMO

## 2021-04-11 DIAGNOSIS — K089 Disorder of teeth and supporting structures, unspecified: Secondary | ICD-10-CM

## 2021-04-11 DIAGNOSIS — F411 Generalized anxiety disorder: Secondary | ICD-10-CM

## 2021-04-11 LAB — GLUCOSE, CAPILLARY
Glucose-Capillary: 82 mg/dL (ref 70–99)
Glucose-Capillary: 83 mg/dL (ref 70–99)
Glucose-Capillary: 109 mg/dL — ABNORMAL HIGH (ref 70–99)
Glucose-Capillary: 126 mg/dL — ABNORMAL HIGH (ref 70–99)

## 2021-04-11 NOTE — Progress Notes (Signed)
Occupational Therapy Session Note  Patient Details  Name: Desiree Madden MRN: [id number] Date of Birth: 01-24-1951  Today's Date: 04/11/2021 OT Individual Time: 1130-1200 OT Individual Time Calculation (min): 30 min    Short Term Goals: Week 1:  OT Short Term Goal 1 (Week 1): Pt will be able to sit to EOB with S to prepare for self care. OT Short Term Goal 2 (Week 1): Pt will be able to rise to stand with min A to prepare for LB self care. OT Short Term Goal 3 (Week 1): Pt will be able to stand with min A while pulling pants over hips. OT Short Term Goal 4 (Week 1): Pt will complete transfer to toilet with min A.  Skilled Therapeutic Interventions/Progress Updates:    Pt seen this session to focus on balance and overall strength. She initially needed to toilet.  Pt only required S to get up and ambulate with RW to toilet, complete toileting and then move to sink to wash hands.   She started out with LB exercises standing at foot of bed using foot board like a ballet bar. Pt worked on squats and alternating heel raises, only lifting L slightly to avoid ankle misalignment.  Sat to EOB to work on UB exercises of AROM arm circles, overhead presses.  Pt found the exercises challenging.  She said that she is learning with the nurses how to change her ostomy bag.  Pt rested in bed with all needs met. Bed alarm set.   Therapy Documentation Precautions:  Precautions Precautions: Fall,Other (comment) Precaution Comments: Abdominal wound vac. LLQ ostomy. L foot drop Restrictions Weight Bearing Restrictions: No     Pain: Pain Assessment Pain Score: 0-No pain ADL: ADL Eating: Supervision/safety Grooming: Supervision/safety Upper Body Bathing: Supervision/safety Where Assessed-Upper Body Bathing: Sitting at sink Lower Body Bathing: Moderate assistance Where Assessed-Lower Body Bathing: Sitting at sink,Standing at sink Upper Body Dressing: Setup Where Assessed-Upper Body Dressing:  Wheelchair Lower Body Dressing: Contact guard Where Assessed-Lower Body Dressing: Wheelchair,Standing at sink,Sitting at sink Toileting: Supervision/safety Where Assessed-Toileting: Glass blower/designer: Close supervision Toilet Transfer Method: Counselling psychologist: Grab bars   Therapy/Group: Individual Therapy  Nawaal Alling 04/11/2021, 12:21 PM

## 2021-04-11 NOTE — Progress Notes (Signed)
Occupational Therapy Session Note  Patient Details  Name: Desiree Madden MRN: [id number] Date of Birth: [birth year]  Today's Date: 04/11/2021 OT Individual Time: 1005-1100 OT Individual Time Calculation (min): 55 min    Short Term Goals: Week 1:  OT Short Term Goal 1 (Week 1): Pt will be able to sit to EOB with S to prepare for self care. OT Short Term Goal 2 (Week 1): Pt will be able to rise to stand with min A to prepare for LB self care. OT Short Term Goal 3 (Week 1): Pt will be able to stand with min A while pulling pants over hips. OT Short Term Goal 4 (Week 1): Pt will complete transfer to toilet with min A.  Skilled Therapeutic Interventions/Progress Updates:    Treatment session with focus on self-care retraining, functional transfers, and dynamic standing balance.  Pt received semi-reclined in bed reporting need to toilet.  Pt completed bed mobility with close supervision.  Pt ambulated to toilet with RW with CGA and therapist managing wound vac tubing.  Pt completed toileting at sit > stand level with CGA.  Pt ambulated to sink with RW to wash hands and then engage in bathing and dressing tasks.  Pt able to retrieve items from closet while standing with RW with CGA.  Bathing and dressing completed at overall supervision/setup from seated position and CGA when standing.  Pt with good success utilizing figure 4 positioning for LB bathing and dressing.  Pt completed oral care in standing with close supervision.  Engaged in standing activity while completing pipe tree puzzle.  Therapist providing CGA to close supervision and cues for sequencing during standing task.  Pt continues to attempt to pull up on RW or table, but able to push up from w/c with CGA and min cues for hand placement.  Pt transferred back to bed in preparation for upcoming transfer off floor for imaging.   Therapy Documentation Precautions:  Precautions Precautions: Fall,Other (comment) Precaution Comments: Abdominal  wound vac. LLQ ostomy. L foot drop Restrictions Weight Bearing Restrictions: No Pain: Pain Assessment Pain Score: 0-No pain   Therapy/Group: Individual Therapy  Simonne Come 04/11/2021, 12:30 PM

## 2021-04-11 NOTE — Progress Notes (Signed)
Speech Language Pathology Discharge Summary  Patient Details  Name: Ltanya Bayley MRN: [id number] Date of Birth: 1951/08/04  Today's Date: 04/11/2021 SLP Individual Time: 1215-1242 SLP Individual Time Calculation (min): 27 min   Skilled Therapeutic Interventions: Skilled ST services focused on education and swallow skills. SLP facilitated PO consumption lunch tray containing dys 3 and regular textures as well as thin liquids. Pt demonstrated mod I use of swallow strategies. SLP provided education and handout pertaining to soft diet due to poor dentition. Pt demonstrated recall mod I. All questions answered to satisfaction. Pt was left in room with call bell within reach and bed alarm set.    Patient has met 3 of 3 long term goals.  Patient to discharge at overall Modified Independent level.  Reasons goals not met:     Clinical Impression/Discharge Summary:   Pt made great progress meeting 3 out 3 goals discharge at mod I for swallowing skills. Pt is tolerating regular texture diet with mod I ability to consume/cut small bites due to poor dentition or select softer foods on a regular diet. Pt is demonstrate appropriate coordination or respirtation during PO consumption. Pt's trach has been removed and stoma appears to be healing appropriately. Pt demonstrated recall of strategies to protecting healing stoma mod I. Pt benefited from skilled ST services in order to maximize functional independence and reduce burden of care, no further ST services needed.   Caregiver Able to Provide Assistance: Yes  Type of Caregiver Assistance: Physical  Recommendation:  None      Equipment: N/A   Reasons for discharge: Treatment goals met   Patient/Family Agrees with Progress Made and Goals Achieved: Yes    Aritha Huckeba 04/11/2021, 12:49 PM

## 2021-04-11 NOTE — Progress Notes (Signed)
Patient ID: Desiree Madden, female   DOB: 09/07/1951, 70 y.o.   MRN: [id number]  Met with pt and called daughter via telephone to discuss team conference goals of supervision level and target discharge date of 5/9. Both very pleased with how well pt is doing and glad to hear her wound vac is coming off Friday. Have scheduled daughter to come in Friday @ 1:00-3:00 for education will order equipment and have set up with [facility] for home health

## 2021-04-11 NOTE — Progress Notes (Addendum)
[facility] PHYSICAL MEDICINE & REHABILITATION PROGRESS NOTE  Subjective/Complaints: Patient seen standing up following with therapies.  She states she slept well overnight.  Discussed AFO with therapies and patient.  She was noted to have tooth pain yesterday, she states it is minor today.  ROS: Denies CP, SOB, N/V/D  Objective: Vital Signs: Blood pressure (!) 104/59, pulse 70, temperature 98.4 F (36.9 C), temperature source Oral, resp. rate 20, weight 59 kg, SpO2 98 %. No results found. Recent Labs    04/09/21 0624  WBC 7.2  HGB 7.9*  HCT 24.4*  PLT 220   Recent Labs    04/09/21 0624  NA 139  K 3.7  CL 102  CO2 28  GLUCOSE 92  BUN 19  CREATININE 1.04*  CALCIUM 8.5*    Intake/Output Summary (Last 24 hours) at 04/11/2021 1048 Last data filed at 04/11/2021 0725 Gross per 24 hour  Intake 474 ml  Output 100 ml  Net 374 ml        Physical Exam: BP (!) 104/59 (BP Location: Left Arm)   Pulse 70   Temp 98.4 F (36.9 C) (Oral)   Resp 20   Wt 59 kg   SpO2 98%   BMI 22.32 kg/m   Constitutional: No distress . Vital signs reviewed. HENT: Normocephalic.  Atraumatic. Poor dentition. Eyes: EOMI. No discharge. Cardiovascular: No JVD.  RRR. Respiratory: Normal effort.  No stridor.  Bilateral clear to auscultation. GI: Non-distended.  BS +. Left lower quadrant ostomy. Skin: Warm and dry.  Neck stoma with dressing CDI. Psych: Normal mood.  Normal behavior. Musc: No edema in extremities.  No tenderness in extremities. Neuro: Alert Motor: Grossly 4+/5 throughout, except for left lower extremity dorsiflexion, stable  Assessment/Plan: 1. Functional deficits which require 3+ hours per day of interdisciplinary therapy in a comprehensive inpatient rehab setting.  Physiatrist is providing close team supervision and 24 hour management of active medical problems listed below.  Physiatrist and rehab team continue to assess barriers to discharge/monitor patient progress toward  functional and medical goals   Care Tool:  Bathing    Body parts bathed by patient: Right arm,Left arm,Chest,Right upper leg,Left upper leg,Face,Front perineal area,Buttocks,Right lower leg,Left lower leg   Body parts bathed by helper: Right lower leg,Left lower leg Body parts n/a: Abdomen   Bathing assist Assist Level: Contact Guard/Touching assist     Upper Body Dressing/Undressing Upper body dressing   What is the patient wearing?: Bra,Dress    Upper body assist Assist Level: Set up assist    Lower Body Dressing/Undressing Lower body dressing      What is the patient wearing?: Underwear/pull up,Pants     Lower body assist Assist for lower body dressing: Contact Guard/Touching assist     Toileting Toileting    Toileting assist Assist for toileting: Contact Guard/Touching assist     Transfers Chair/bed transfer  Transfers assist     Chair/bed transfer assist level: Contact Guard/Touching assist Chair/bed transfer assistive device: Programmer, multimedia   Ambulation assist      Assist level: Contact Guard/Touching assist Assistive device: Walker-rolling Max distance: 178ft   Walk 10 feet activity   Assist     Assist level: Contact Guard/Touching assist Assistive device: Walker-rolling   Walk 50 feet activity   Assist Walk 50 feet with 2 turns activity did not occur: Safety/medical concerns  Assist level: Contact Guard/Touching assist Assistive device: Walker-rolling    Walk 150 feet activity   Assist Walk 150 feet  activity did not occur: Safety/medical concerns  Assist level: Contact Guard/Touching assist Assistive device: Walker-rolling    Walk 10 feet on uneven surface  activity   Assist Walk 10 feet on uneven surfaces activity did not occur: Safety/medical concerns         Wheelchair     Assist Will patient use wheelchair at discharge?: No   Wheelchair activity did not occur: N/A         Wheelchair  50 feet with 2 turns activity    Assist    Wheelchair 50 feet with 2 turns activity did not occur: N/A       Wheelchair 150 feet activity     Assist  Wheelchair 150 feet activity did not occur: N/A        Medical Problem List and Plan: 1.  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   Continue CIR 2.  Antithrombotics: Continue Eliquis             -antiplatelet therapy: N/A 3. Pain Management: Decrease Oxcodone to q12H prn.   Controlled with meds on 5/4 4. Mood: LCSW to follow for evaluation and support.              -antipsychotic agents: N/A 5. Neuropsych: This patient is not fully cable of making decisions on her own behalf. 6. Skin/Wound Care: Routine pressure relief measures.   +VAC 7. Fluids/Electrolytes/Nutrition: Monitor I/Os.             Continue Vitamin C/Multivitamin 8. Perforated diverticulitis s/p sigmoid colectomy w/end colostomy: WOC following to education.   +Ostomy  Continue senna 9. NSTEMI/ICM/Chronic combined CHF: Compensated. On low dose Toprol XL Filed Weights   04/10/21 0448 04/11/21 0449 04/11/21 0533  Weight: 62.3 kg 58.3 kg 59 kg   ?  Reliability on 5/4 10. SVT/PAF: Monitor HR tid--continue amiodarone, toprol and Eliquis.  Controlled on 5/4, continue to monitor  Monitor increase activity 11. VDRF s/p trach: Monitor respiratory status  Continue cuffed #6 shiley  Decannulated without issue on 5/3 12. Hypokalemia/Hypomagnesemia: Due to malnutrition/critical illness             Monitor and supplement as indicated.             Continue supplements to help with nutritional state.             Continue Marinol-->monitor for SE.   Potassium 3.7 on 5/2  Magnesium 2.0 on 4/23 13. Situational depression: Continue Remeron for smood stabilization              --Xanax prn to manage anxiety.  14. Cystopexy/Urinary retention:   Added flomax              Improving 15. Chronic insomnia: Continue Xanax prn. Melatonin ineffective.              --Remeron  16. Left foot drop   PRAFO  Awaiting AFO 17.  Dysphagia  Regular thins now with med modifications  Monitor p.o. intake  Continue to advance diet as tolerated 18.  CKD stage II/III  Creatinine 1.04 on 5/2, plan to order labs for later this week  Continue to monitor 19. Poor dentition with broken teeth  Xray ordered  LOS: 8 days A FACE TO FACE EVALUATION WAS PERFORMED  Jermichael Belmares Lorie Phenix 04/11/2021, 10:48 AM

## 2021-04-11 NOTE — Plan of Care (Signed)
  Problem: Consults Goal: RH GENERAL PATIENT EDUCATION Description: See Patient Education module for education specifics. Outcome: Progressing   Problem: RH BOWEL ELIMINATION Goal: RH STG MANAGE BOWEL WITH ASSISTANCE Description: STG Manage Bowel with min Assistance. Outcome: Progressing   Problem: RH BLADDER ELIMINATION Goal: RH STG MANAGE BLADDER WITH ASSISTANCE Description: STG Manage Bladder With mod I  Assistance Outcome: Progressing   Problem: RH SKIN INTEGRITY Goal: RH STG SKIN FREE OF INFECTION/BREAKDOWN Description: With min assist Outcome: Progressing   Problem: RH SAFETY Goal: RH STG ADHERE TO SAFETY PRECAUTIONS W/ASSISTANCE/DEVICE Description: STG Adhere to Safety Precautions With cues/reminders Assistance/Device. Outcome: Progressing   Problem: RH PAIN MANAGEMENT Goal: RH STG PAIN MANAGED AT OR BELOW PT'S PAIN GOAL Description: At or below level 4 Outcome: Progressing   Problem: RH KNOWLEDGE DEFICIT GENERAL Goal: RH STG INCREASE KNOWLEDGE OF SELF CARE AFTER HOSPITALIZATION Description: Patient and daughter/son-in-law will be able to manage care at discharge using handouts and educational material independently Outcome: Progressing   Problem: Education: Goal: Knowledge of ostomy care will improve Description: Patient and daughter will be able to manage ostomy independently Outcome: Progressing Goal: Understanding of discharge needs will improve Description: Patient and daughter will be able to manage care needs independently Outcome: Progressing   Problem: Bowel/Gastric/Urinary: Goal: Gastrointestinal status for postoperative course will improve Description: With min assist Outcome: Progressing

## 2021-04-11 NOTE — Plan of Care (Signed)
  Problem: RH Swallowing Goal: LTG Patient will consume least restrictive diet using compensatory strategies with assistance (SLP) Description: LTG:  Patient will consume least restrictive diet using compensatory strategies with assistance (SLP) Outcome: Completed/Met Goal: LTG Patient will participate in dysphagia therapy to increase swallow function with assistance (SLP) Description: LTG:  Patient will participate in dysphagia therapy to increase swallow function with assistance (SLP) Outcome: Completed/Met Goal: LTG Pt will demonstrate functional change in swallow as evidenced by bedside/clinical objective assessment (SLP) Description: LTG: Patient will demonstrate functional change in swallow as evidenced by bedside/clinical objective assessment (SLP) Outcome: Completed/Met   

## 2021-04-11 NOTE — Progress Notes (Signed)
Physical Therapy Weekly Progress Note  Patient Details  Name: Desiree Madden MRN: [id number] Date of Birth: 09/19/1951  Beginning of progress report period: April 04, 2021 End of progress report period: Apr 11, 2021  Today's Date: 04/11/2021 PT Individual Time: 0800-0855 PT Individual Time Calculation (min): 55 min   Patient has met 4 of 4 short term goals. Pt is making appropriate progress towards her goals. She initially demonstrated high levels of anxiety regarding her lines/leads (trach, abdominal wound vac, colostomy) however this is resolving some which is reducing her distractions during therapy. She is able to complete bed mobility with supervision (lines), sit<>stand transfers with CGA and RW, stand<>pivot transfer with CGA and RW, and ambulated ~129f with CGA and RW. She requires minA for navigating up/down x4 steps with bilateral hand rails.  Patient continues to demonstrate the following deficits muscle weakness, decreased cardiorespiratoy endurance, unbalanced muscle activation and decreased motor planning, decreased attention and decreased problem solving and decreased standing balance, decreased postural control and decreased balance strategies and therefore will continue to benefit from skilled PT intervention to increase functional independence with mobility.  Patient progressing toward long term goals.  Continue plan of care.  PT Short Term Goals Week 1:  PT Short Term Goal 1 (Week 1): Pt will complete bed mobility with CGA PT Short Term Goal 1 - Progress (Week 1): Met PT Short Term Goal 2 (Week 1): Pt will perform bed<>chair transfers with minA and LRAD PT Short Term Goal 2 - Progress (Week 1): Met PT Short Term Goal 3 (Week 1): Pt will ambulate 584fwith minA and LRAD PT Short Term Goal 3 - Progress (Week 1): Met PT Short Term Goal 4 (Week 1): Pt will initiate stair training PT Short Term Goal 4 - Progress (Week 1): Met  Week 2:  PT Short Term Goal 1 (Week 2): STG = LTG  due to ELOS  Skilled Therapeutic Interventions/Progress Updates:    Pt greeted supine in bed at start of session, agreeable to therapy. No reports of pain. Bed mobility completed with supervision with assist for line management (wound vac). Stand<>pivot transfer with CGA from EOB to w/c with cues for safety approach and technique. W/c transport for time management downstairs to 63M therapy gym.  Performed SABO for NMES to L ankle DF muscle group - pt reports she feels the e-stim but no active ankle DF or muscle twitching, therefore, terminated this intervention.  Gait training ~17597fith CGA and RW without ankle DF ace wrap - cues for awareness of L foot placement and increased L heel strike. 2nd gait trial with ace wrap assist where she ambulated 2x100f39fth CGA and RW - improved foot clearance and stepping pattern with improved confidence as well. [facility] awaiting response.  Gait training with bilateral hand held assist and instructed on lateral side stepping L<>R along length of mat table, emphasizing L foot clearance and stepping pattern - she tends to "drag" her L foot while side stepping R but is able to correct with instruction. Performed 4x10ft59fts with minA.  Stair training up/down x4 steps with minA and bilateral hand rail support - needs cues for stepping pattern (ascend with R foot and descend with L foot). She requires minA primarily for powering to step up. Excessive forward flexion compensation during this task but no knee buckling or LOB.  Pt returned upstairs via w/c transport for energy conservation. Stand<>pivot with CGA and use of bed rail to  EOB, cues for safety and L foot awareness. She remained seated EOB at end of session with bed alarm on, needs in reach.  Therapy Documentation Precautions:  Precautions Precautions: Fall,Other (comment) Precaution Comments: Abdominal wound vac. LLQ ostomy. L foot drop Restrictions Weight Bearing  Restrictions: No General:    Therapy/Group: Individual Therapy   P  PT 04/11/2021, 7:33 AM

## 2021-04-11 NOTE — Consult Note (Signed)
[place] Nurse ostomy follow up Stoma type/location: LUQ colostomy  Teaching today.  Pouch change performed with patient.  She is gaining confidence in pouch changes although concerned about standing in front of mirror with right side weakness and may need assistance from daughter at home.  Daughter works so I have stressed importance of patient being independent.  She states "I really wish someone else wound do this"  I offer emotional support  I tell her she will become independent and her daughter will be a back up.  She is going to ask her daughter to come to Friday pouch change and NPWT change.   Stomal assessment/size: 1 1/4" pink moist and budded.  Stool is less thick today.  Peristomal assessment: intact  Creasing at 4 and 8 o'clock.  Treatment options for stomal/peristomal skin: barrier ring and 2 piece 2 1/4" pouch.  Output soft brown stool. Less thick  Able to empty from pouch . Ostomy pouching: 2pc. 2 1/4" pouch with barrier ring  Education provided: POuch change performed by patient.  She removes pouch, cleans, applies barrier ring.  Cuts barrier, assembles pouch and applies.  She requires assistance with emptying and I have spoken with bedside RN who will help with this as needed.  Daughter possibly to come Friday.  Enrolled patient in [place] Start Discharge program: Yes [place] Nurse Consult Note: Reason for Consult: Granulation tissue improving.  Wound type:midline surgical Pressure Injury POA: NA Measurement:5 cm x 1 cm x 1 cm proximal end, 7 cm x 4 cm 2 cm distal end. Intact skin in between (umbilicus)  Protected with barrier ring.  Wound MGN:OIBBC red and granulating Drainage (amount, consistency, odor) moderate serosanguinous  No odor.  Canister changed today.   Periwound: some medical adhesive related skin injury from 9 to 12 in distal segment.  Protected with barrier ring .  3 pieces black foam placed in wound.  Covered with drape.  Seal immediately achieved. Change  Mon/Wed/Fri.  Dressing procedure/placement/frequency: 3 pieces foam covered with drape.  Will follow.  Domenic Moras MSN, RN, FNP-BC CWON Wound, Ostomy, Continence Nurse Pager [phone]

## 2021-04-11 NOTE — Patient Care Conference (Signed)
Inpatient RehabilitationTeam Conference and Plan of Care Update Date: 04/11/2021   Time: 11:08 AM    Patient Name: Desiree Madden      Medical Record Number: [id number]  Date of Birth: Sep 12, 1951 Sex: Female         Room/Bed: 2C02C/5C02C-01 Payor Info: Payor: [employer] MEDICARE / Plan: [employer] HMO / Product Type: *No Product type* /    Admit Date/Time:  04/03/2021 12:48 PM  Primary Diagnosis:  Intensive care (ICU) myopathy  Hospital Problems: Principal Problem:   Intensive care (ICU) myopathy Active Problems:   Protein-calorie malnutrition, severe   Physical debility   Left foot drop   Urinary retention   Hypokalemia   Tracheostomy dependent (HCC)   PAF (paroxysmal atrial fibrillation) ([place])   Dysphagia   Anxiety state   Poor dentition    Expected Discharge Date: Expected Discharge Date: 04/16/21  Team Members Present: Physician leading conference: Dr. Delice Lesch Care Coodinator Present: Dorien Chihuahua, RN, BSN, CRRN;Becky Dupree, LCSW Nurse Present: Dorien Chihuahua, RN PT Present: Ginnie Smart, PT OT Present: Meriel Pica, OT SLP Present: Junius Argyle, SLP PPS Coordinator present : Gunnar Fusi, SLP     Current Status/Progress Goal Weekly Team Focus  Bowel/Bladder             Swallow/Nutrition/ Hydration   Mod I on dys 3 textures and thin liquids,  Mod I  upgrade to regular and selecting softer foods the d/c from ST services   ADL's   set up UB, CGA LB self care and toilet transfers - pt has improved dramatically  supervision  ADL training, functional mobility, strengthening, balance, pt/fam education   Mobility   supervision bed mobility (lines/leads). CGA stand<>pivot transfers. Gait ~177ft with CGA and RW. 4 steps with minA and bilateral hand rails  supervision  Trying to schedule to get [facility]) to assist with AFO for L foot. Otherwise, working on LLE strengthening, functional transfers, safety awareness, gait training, stair training, pt education,  and DC planning.   Communication             Safety/Cognition/ Behavioral Observations            Pain             Skin               Discharge Planning:  Home with husband who can be there but daughter will be providing care if needed. Daughter and pt to learn colostomy care prior to DC   Team Discussion: Lurline Idol out, SLP services discontinued. Continue care for abd abcess drain and colostomy. Good endurance noted and anxiety decreased. Patient on target to meet rehab goals: yes, currently CGA for toileting, bathing and dressing. Able to manage steps with min assist even with left foot drop.  *See Care Plan and progress notes for long and short-term goals.   Revisions to Treatment Plan:   Teaching Needs: Drain and colostomy care, medications, safety, transfers, etc.   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  Barriers to Discharge: Medical stability;Neurogenic Bowel & Bladder;Wound care;Trach;Other (comments)  Barriers to Discharge Comments: Ostomy, VAC Possible Resolutions to Barriers/Weekly Focus: Therapies, cont to follow weights, follow labs - Cr, Hb, AFO, patiet and family edu, oral xray  ordered   Continued Need for Acute Rehabilitation Level of Care: The patient requires daily medical management by a physician with specialized training in physical medicine and rehabilitation for the following reasons: Direction of a multidisciplinary physical rehabilitation program to maximize functional independence : Yes Medical management of patient stability for increased activity during participation in an intensive rehabilitation regime.: Yes Analysis of laboratory  values and/or radiology reports with any subsequent need for medication adjustment and/or medical intervention. : Yes   I attest that I was present, lead the team conference, and concur with the assessment and plan of the team.   Dorien Chihuahua B 04/11/2021, 1:48 PM

## 2021-04-11 NOTE — Progress Notes (Signed)
Patient continues to practice emptying ostomy pouch and also letting the air out when it gets gas in it. Encouraged patient to continue practicing and she will continue to improve.

## 2021-04-11 NOTE — Progress Notes (Signed)
Discussed colostomy care with patient including products available for comfort ( deodorizing tablets, thickening tablets) pt was able to "burp" the colostomy without difficulty.

## 2021-04-12 ENCOUNTER — Ambulatory Visit: Payer: Medicare HMO | Admitting: Physician Assistant

## 2021-04-12 DIAGNOSIS — K0889 Other specified disorders of teeth and supporting structures: Secondary | ICD-10-CM

## 2021-04-12 DIAGNOSIS — D62 Acute posthemorrhagic anemia: Secondary | ICD-10-CM

## 2021-04-12 DIAGNOSIS — R5381 Other malaise: Principal | ICD-10-CM

## 2021-04-12 LAB — GLUCOSE, CAPILLARY
Glucose-Capillary: 93 mg/dL (ref 70–99)
Glucose-Capillary: 86 mg/dL (ref 70–99)
Glucose-Capillary: 91 mg/dL (ref 70–99)
Glucose-Capillary: 95 mg/dL (ref 70–99)

## 2021-04-12 NOTE — Progress Notes (Signed)
Orthopedic Tech Progress Note Patient Details:  Desiree Madden 02-02-1951 [id number]  Ordered brace Patient ID: Desiree Madden, female   DOB: 06/17/1951, 70 y.o.   MRN: [id number]   Desiree Madden 04/12/2021, 6:48 PM

## 2021-04-12 NOTE — Progress Notes (Addendum)
Inpatient Rehabilitation Care Coordinator Discharge Note  The overall goal for the admission was met for:   Discharge location: Yes-HOME WITH HUSBAND WHO HAS DEMENTIA AND DAUGHTER AND SON IN-[place]  Length of Stay: Yes-13 days  Discharge activity level: Yes-SUPERVISION LEVEL  Home/community participation: Yes  Services provided included: MD, RD, PT, OT, SLP, RN, CM, Pharmacy, Neuropsych and SW  Financial Services: Private Insurance: [employer] MEDICARE  Choices offered to/list presented to:PT AND DAUGHTER  Follow-up services arranged: Home Health: [facility]-PT,OT,RN, DME: [facility] & 3 IN 1 and Patient/Family has no preference for HH/DME agencies  Comments (or additional information):DAUGHTER WAS HERE FOR EDUCATION AND IT WENT WELL AND BOTH FEEL COMFORTABLE WITH DC HOME  Patient/Family verbalized understanding of follow-up arrangements: Yes  Individual responsible for coordination of the follow-up plan: JESSICA-DAUGHTER [phone]  Confirmed correct DME delivered: ,  G 04/12/2021    ,  G 

## 2021-04-12 NOTE — Progress Notes (Signed)
Occupational Therapy Session Note  Patient Details  Name: Desiree Madden MRN: [id number] Date of Birth: 28-Jan-1951  Today's Date: 04/12/2021 OT Individual Time: 8413-2440 OT Individual Time Calculation (min): 29 min    Short Term Goals: Week 2:  OT Short Term Goal 1 (Week 2): Pt will complete all bathing tasks with setup sitting sink side. OT Short Term Goal 2 (Week 2): Pt will complete toilet transfers with CGA and least restrictive AD. OT Short Term Goal 3 (Week 2): Pt will complete LB dressing with setup. OT Short Term Goal 4 (Week 2): Pt will increase standing tolerance to 6 mins for ADL and IADL tasks.  Skilled Therapeutic Interventions/Progress Updates:    Pt in recliner to start requesting to complete toilet transfer.  She reports wanting a wheelchair at home but she doesn't "trust" the RW.  Discussed with pt that during mobility that she needs a RW for safety and she continues to decline trusting it.  Finally, had her complete functional mobility to the bathroom without use of the RW for support to complete toilet transfer.  She needed UE support on the bed and the door frame as well as min assist from therapist.  Pt eventually realizing that she may need the RW for safety, but she still is not confident in it.  She performed all toileting at min assist level as well as completing functional mobility to the sink for washing her hands without an assistive device.  Next had her complete functional mobility with the RW to and from the door of her room multiple times totaling over 150', all with close supervision and therapist managing the wound vac only.  Finished session with pt sitting in the recliner and with the call button and phone in reach and safety alarm in place.    Therapy Documentation Precautions:  Precautions Precautions: Fall,Other (comment) Precaution Comments: Abdominal wound vac. LLQ ostomy. L foot drop Restrictions Weight Bearing Restrictions: No  Pain: Pain  Assessment Pain Scale: Faces Pain Score: 0-No pain ADL: See Care Tool Section for some details of mobility and selfcare  Therapy/Group: Individual Therapy  Ingeborg Fite OTR/L 04/12/2021, 12:11 PM

## 2021-04-12 NOTE — Progress Notes (Signed)
Occupational Therapy Weekly Progress Note  Patient Details  Name: Desiree Madden MRN: [id number] Date of Birth: 04/23/1951  Beginning of progress report period: April 04, 2021 End of progress report period: Apr 12, 2021  Today's Date: 04/12/2021 OT Individual Time: 0802-0901 OT Individual Time Calculation (min): 59 min    Patient has met 4 of 4 short term goals and is making excellent progress with skilled OT.  Patient continues to demonstrate the following deficits: muscle weakness and decreased cardiorespiratoy endurance and therefore will continue to benefit from skilled OT intervention to enhance overall performance with BADL and iADL.  Patient progressing toward long term goals..  Continue plan of care.  OT Short Term Goals  Week 1:  OT Short Term Goal 1 (Week 1): Pt will be able to sit to EOB with S to prepare for self care. OT Short Term Goal 1 - Progress (Week 1): Met OT Short Term Goal 2 (Week 1): Pt will be able to rise to stand with min A to prepare for LB self care. OT Short Term Goal 2 - Progress (Week 1): Met OT Short Term Goal 3 (Week 1): Pt will be able to stand with min A while pulling pants over hips. OT Short Term Goal 3 - Progress (Week 1): Met OT Short Term Goal 4 (Week 1): Pt will complete transfer to toilet with min A. OT Short Term Goal 4 - Progress (Week 1): Met Week 2:  OT Short Term Goal 1 (Week 2): Pt will complete all bathing tasks with setup sitting sink side. OT Short Term Goal 2 (Week 2): Pt will complete toilet transfers with CGA and least restrictive AD. OT Short Term Goal 3 (Week 2): Pt will complete LB dressing with setup. OT Short Term Goal 4 (Week 2): Pt will increase standing tolerance to 6 mins for ADL and IADL tasks.  Skilled Therapeutic Interventions/Progress Updates:    Pt received in room and consented to OT tx. Pt seen for morning ADL routine including bathing, dressing, toileting, and functional transfer analysis. Please see assist levels  below. Pt tolerated session very well, required increased time for thoroughness and cuing for safety throughout routine. Pt instructed in functional STS's from recliner (lower surface) to RW for 5x. Pt required min cuing for proper hand placement and body mechanics during transitions with good carryover. After tx, pt left in recliner with chair alarm on and all needs met.   Therapy Documentation Precautions:  Precautions Precautions: Fall,Other (comment) Precaution Comments: Abdominal wound vac. LLQ ostomy. L foot drop Restrictions Weight Bearing Restrictions: No General:   Vital Signs: Therapy Vitals Temp: 98.4 F (36.9 C) Temp Source: Oral Pulse Rate: 70 Resp: 14 BP: 116/60 Patient Position (if appropriate): Lying Oxygen Therapy SpO2: 97 % O2 Device: Room Air Pain: Pain Assessment Pain Scale: 0-10 Pain Score: 0-No pain Pain Type: Acute pain;Surgical pain Pain Location: Abdomen Pain Orientation: Mid Pain Descriptors / Indicators: Aching Pain Frequency: Intermittent Pain Onset: On-going Patients Stated Pain Goal: 1 Pain Intervention(s): Medication (See eMAR) ADL: ADL Eating: Supervision/safety Grooming: Modified independent Where Assessed-Grooming: Standing at sink Upper Body Bathing: Setup Where Assessed-Upper Body Bathing: Sitting at sink Lower Body Bathing: Supervision/safety Where Assessed-Lower Body Bathing: Sitting at sink,Standing at sink Upper Body Dressing: Setup Where Assessed-Upper Body Dressing: Sitting at sink Lower Body Dressing: Supervision/safety Where Assessed-Lower Body Dressing: Wheelchair,Standing at sink,Sitting at sink Toileting: Supervision/safety Where Assessed-Toileting: Glass blower/designer: Close supervision Toilet Transfer Method: Counselling psychologist: Grab bars    Therapy/Group:  Individual Therapy  Taylan Marez 04/12/2021, 8:24 AM

## 2021-04-12 NOTE — Progress Notes (Signed)
Occupational Therapy Session Note  Patient Details  Name: Desiree Madden MRN: [id number] Date of Birth: [birth year]  Today's Date: 04/12/2021 OT Individual Time: 1401-1501 OT Individual Time Calculation (min): 60 min    Short Term Goals: Week 1:  OT Short Term Goal 1 (Week 1): Pt will be able to sit to EOB with S to prepare for self care. OT Short Term Goal 1 - Progress (Week 1): Met OT Short Term Goal 2 (Week 1): Pt will be able to rise to stand with min A to prepare for LB self care. OT Short Term Goal 2 - Progress (Week 1): Met OT Short Term Goal 3 (Week 1): Pt will be able to stand with min A while pulling pants over hips. OT Short Term Goal 3 - Progress (Week 1): Met OT Short Term Goal 4 (Week 1): Pt will complete transfer to toilet with min A. OT Short Term Goal 4 - Progress (Week 1): Met  Skilled Therapeutic Interventions/Progress Updates:    Pt received in room and consented to OT tx. Pt taken outside for tx session today. Instructed in BUE strengthening HEP to increase strength and activity tolerance for ADLs. Pt instructed in standing and reaching activity while in room to simulate item retrieval for increased dynamic standing balance and functional reach for ADLs and IADLs. After tx, pt left in w/c with all needs met, RN and nurse tech notified of wound vac alarm going off.   Therapy Documentation Precautions:  Precautions Precautions: Fall,Other (comment) Precaution Comments: Abdominal wound vac. LLQ ostomy. L foot drop Restrictions Weight Bearing Restrictions: No   Pain: none   ADL: Toileting: SUP Toilet transfer: SUP with RW      Exercises:  Pt instructed in 2#db unilateral exercises including elbow flexion, chest press, shoulder press, shoulder flexion, and upright row, wrist pro and supination for 3x15 with mod cuing for proper tech with good carryover.    Therapy/Group: Individual Therapy  Ceria Suminski 04/12/2021, 2:57 PM

## 2021-04-12 NOTE — Progress Notes (Signed)
[place] PHYSICAL MEDICINE & REHABILITATION PROGRESS NOTE  Subjective/Complaints: Patient seen sitting up in her chair working with therapy this morning.  She states she slept well overnight.  She has questions regarding showering and wound VAC.  She is looking forward to her discharge on Monday.  ROS: Denies CP, SOB, N/V/D  Objective: Vital Signs: Blood pressure 116/60, pulse 70, temperature 98.4 F (36.9 C), temperature source Oral, resp. rate 14, weight 59 kg, SpO2 97 %. DG Orthopantogram  Result Date: 04/11/2021 CLINICAL DATA:  Poor dentition, broken and cracked teeth EXAM: ORTHOPANTOGRAM/PANORAMIC COMPARISON:  None. FINDINGS: Panorex view of the mandible was performed. There are no acute or destructive bony lesions. There is extremely poor dentition, with fractured and erosive changes of the remaining teeth diffusely. Visualized sinuses are clear. IMPRESSION: 1. Extremely poor dentition. No acute or destructive bony abnormality. Electronically Signed   By: Randa Ngo M.D.   On: 04/11/2021 15:15   No results for input(s): WBC, HGB, HCT, PLT in the last 72 hours. No results for input(s): NA, K, CL, CO2, GLUCOSE, BUN, CREATININE, CALCIUM in the last 72 hours.  Intake/Output Summary (Last 24 hours) at 04/12/2021 1032 Last data filed at 04/12/2021 0700 Gross per 24 hour  Intake 720 ml  Output --  Net 720 ml        Physical Exam: BP 116/60   Pulse 70   Temp 98.4 F (36.9 C) (Oral)   Resp 14   Wt 59 kg   SpO2 97%   BMI 22.32 kg/m   Constitutional: No distress . Vital signs reviewed. HENT: Normocephalic.  Atraumatic. Poor dentition. Eyes: EOMI. No discharge. Cardiovascular: No JVD.  RRR. Respiratory: Normal effort.  No stridor.  Bilateral clear to auscultation. GI: Non-distended.  BS +. Left lower quadrant ostomy Skin: Warm and dry.   Neck stoma with dressing Psych: Normal mood.  Normal behavior. Musc: No edema in extremities.  No tenderness in extremities. Neuro:  Alert Motor: Grossly 4+/5 throughout, except for left lower extremity dorsiflexion 0/5  Assessment/Plan: 1. Functional deficits which require 3+ hours per day of interdisciplinary therapy in a comprehensive inpatient rehab setting.  Physiatrist is providing close team supervision and 24 hour management of active medical problems listed below.  Physiatrist and rehab team continue to assess barriers to discharge/monitor patient progress toward functional and medical goals   Care Tool:  Bathing    Body parts bathed by patient: Right arm,Left arm,Chest,Right upper leg,Left upper leg,Face,Front perineal area,Buttocks,Right lower leg,Left lower leg   Body parts bathed by helper: Right lower leg,Left lower leg Body parts n/a: Abdomen   Bathing assist Assist Level: Supervision/Verbal cueing     Upper Body Dressing/Undressing Upper body dressing   What is the patient wearing?: Bra,Dress    Upper body assist Assist Level: Set up assist    Lower Body Dressing/Undressing Lower body dressing      What is the patient wearing?: Underwear/pull up     Lower body assist Assist for lower body dressing: Supervision/Verbal cueing     Toileting Toileting    Toileting assist Assist for toileting: Supervision/Verbal cueing     Transfers Chair/bed transfer  Transfers assist     Chair/bed transfer assist level: Supervision/Verbal cueing Chair/bed transfer assistive device: Programmer, multimedia   Ambulation assist      Assist level: Contact Guard/Touching assist Assistive device: Walker-rolling Max distance: 136ft   Walk 10 feet activity   Assist     Assist level: Contact Guard/Touching assist  Assistive device: Walker-rolling   Walk 50 feet activity   Assist Walk 50 feet with 2 turns activity did not occur: Safety/medical concerns  Assist level: Contact Guard/Touching assist Assistive device: Walker-rolling    Walk 150 feet activity   Assist Walk  150 feet activity did not occur: Safety/medical concerns  Assist level: Contact Guard/Touching assist Assistive device: Walker-rolling    Walk 10 feet on uneven surface  activity   Assist Walk 10 feet on uneven surfaces activity did not occur: Safety/medical concerns         Wheelchair     Assist Will patient use wheelchair at discharge?: No   Wheelchair activity did not occur: N/A         Wheelchair 50 feet with 2 turns activity    Assist    Wheelchair 50 feet with 2 turns activity did not occur: N/A       Wheelchair 150 feet activity     Assist  Wheelchair 150 feet activity did not occur: N/A        Medical Problem List and Plan: 1.  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   Continue CIR 2.  Antithrombotics: Continue Eliquis             -antiplatelet therapy: N/A 3. Pain Management: Decrease Oxcodone to q12H prn.   Controlled with meds on 5/5 4. Mood: LCSW to follow for evaluation and support.              -antipsychotic agents: N/A 5. Neuropsych: This patient is not fully cable of making decisions on her own behalf. 6. Skin/Wound Care: Routine pressure relief measures.   +VAC 7. Fluids/Electrolytes/Nutrition: Monitor I/Os.             Continue Vitamin C/Multivitamin 8. Perforated diverticulitis s/p sigmoid colectomy w/end colostomy: WOC following to education.   +Ostomy  Continue senna 9. NSTEMI/ICM/Chronic combined CHF: Compensated. On low dose Toprol XL Filed Weights   04/10/21 0448 04/11/21 0449 04/11/21 0533  Weight: 62.3 kg 58.3 kg 59 kg   ?  Appears relatively controlled on 5/5 10. SVT/PAF: Monitor HR tid--continue amiodarone, toprol and Eliquis.  Controlled on 5/5, continue to monitor  Monitor increase activity 11. VDRF s/p trach: Monitor respiratory status  Continue cuffed #6 shiley  Decannulated without issue on 5/3 12.  Hypokalemia/Hypomagnesemia: Due to malnutrition/critical illness             Monitor and supplement as indicated.             Continue supplements to help with nutritional state.             Continue Marinol-->monitor for SE.   Potassium 3.7 on 5/2, labs ordered for tomorrow  Magnesium 2.0 on 4/23, labs ordered for tomorrow 13. Situational depression: Continue Remeron for smood stabilization              --Xanax prn to manage anxiety.  14. Cystopexy/Urinary retention:   Added flomax             Improving 15. Chronic insomnia: Continue Xanax prn. Melatonin ineffective.              --Remeron  16. Left foot drop   PRAFO  Awaiting AFO 17.  Dysphagia  Regular thins now with med modifications  Monitor p.o. intake  Continue to advance diet as tolerated 18.  CKD stage II/III  Creatinine 1.04 on 5/2, labs ordered for tomorrow  Continue to monitor 19. Poor dentition with broken teeth  Xray showing poor dentition, but no other abnormalities 20.  Acute blood loss anemia  Hemoglobin 7.9 on 5/2, labs ordered for tomorrow  Continue to monitor  LOS: 9 days A FACE TO FACE EVALUATION WAS PERFORMED  Desiree Madden Desiree Madden 04/12/2021, 10:32 AM

## 2021-04-12 NOTE — Progress Notes (Signed)
Physical Therapy Session Note  Patient Details  Name: Desiree Madden MRN: [id number] Date of Birth: Mar 24, 1951  Today's Date: 04/12/2021 PT Individual Time: 1300-1355 PT Individual Time Calculation (min): 55 min   Short Term Goals: Week 2:  PT Short Term Goal 1 (Week 2): STG = LTG due to ELOS  Skilled Therapeutic Interventions/Progress Updates:     Pt greeted sitting in w/c, agreeable to therapy session. No reports of pain. Pt reports she's excited regarding Monday's DC home. Family education scheduled for tomorrow with her daughter.   Focus of session to complete AFO consult with [facility] from [facility]   W/c transport for time management to main rehab gym where [facility] arrived thereafter. Collaboration with CPO to determine AFO needs for L foot - will arrive tomorrow to trial.   Multiple gait trials with close supervision and RW - 49ft + 168ft  + 262ft + 1ft. Ace wrapped L foot for ankle DF assist. Cues for "heel-toe" foot pattern on L. No knee buckling or LOB during gait trials, distances limited by fatigue.   Focused remainder of session on stair training. She navigated up/down 4x4 steps (seated rest) with minA and bilateral hand rails. Needs minA for powering up to rise to step and is able to go down steps with CGA. Pt with good recall of stepping sequencing (ascend with R foot, descent with L foot). Pt reports she feels confident her daughter can assist with this - will review tomorrow during family training.   Completed standing there-ex with 3lb dowel rod with CGA -2x10 bicep curls -2x10 chest press *seated rest b/w sets and cues throughout for sequencing and technique.  Pt returned upstairs to her room where she remained seated in w/c, needs within reach.  Therapy Documentation Precautions:  Precautions Precautions: Fall,Other (comment) Precaution Comments: Abdominal wound vac. LLQ ostomy. L foot drop Restrictions Weight Bearing Restrictions: No General:     Therapy/Group: Individual Therapy  Aleksei Goodlin P Keshana Klemz PT 04/12/2021, 7:44 AM

## 2021-04-12 NOTE — Progress Notes (Addendum)
Patient ID: Desiree Madden, female   DOB: 1951/06/19, 71 y.o.   MRN: [id number] Pt has refused the rolling walker that was delivered and now wants a wheelchair. Have asked PT for measurements.  2:11 PM Met with pt to discuss wheelchair need over rolling walker. Daughter will get rolling walker and wants worker to get her a wheelchair. Have ordered via [employer] aware of rental and co-pays involved.  3:05 PM have informed pt of co-pay for wheelchair and she reports cancel it and get the rolling walker back. She can get a transport chair on her own

## 2021-04-13 DIAGNOSIS — I5042 Chronic combined systolic (congestive) and diastolic (congestive) heart failure: Secondary | ICD-10-CM

## 2021-04-13 LAB — BASIC METABOLIC PANEL
Anion gap: 5 (ref 5–15)
BUN: 15 mg/dL (ref 8–23)
CO2: 28 mmol/L (ref 22–32)
Calcium: 8.8 mg/dL — ABNORMAL LOW (ref 8.9–10.3)
Chloride: 107 mmol/L (ref 98–111)
Creatinine, Ser: 1.07 mg/dL — ABNORMAL HIGH (ref 0.44–1.00)
GFR, Estimated: 56 mL/min — ABNORMAL LOW (ref >60)
Glucose, Bld: 84 mg/dL (ref 70–99)
Potassium: 3.8 mmol/L (ref 3.5–5.1)
Sodium: 140 mmol/L (ref 135–145)

## 2021-04-13 LAB — CBC WITH DIFFERENTIAL/PLATELET
Abs Immature Granulocytes: 0.04 10*3/uL (ref 0.00–0.07)
Basophils Absolute: 0.1 10*3/uL (ref 0.0–0.1)
Basophils Relative: 1 %
Eosinophils Absolute: 0.1 10*3/uL (ref 0.0–0.5)
Eosinophils Relative: 2 %
HCT: 24.6 % — ABNORMAL LOW (ref 36.0–46.0)
Hemoglobin: 7.9 g/dL — ABNORMAL LOW (ref 12.0–15.0)
Immature Granulocytes: 1 %
Lymphocytes Relative: 28 %
Lymphs Abs: 2 10*3/uL (ref 0.7–4.0)
MCH: 31.1 pg (ref 26.0–34.0)
MCHC: 32.1 g/dL (ref 30.0–36.0)
MCV: 96.9 fL (ref 80.0–100.0)
Monocytes Absolute: 0.7 10*3/uL (ref 0.1–1.0)
Monocytes Relative: 9 %
Neutro Abs: 4.3 10*3/uL (ref 1.7–7.7)
Neutrophils Relative %: 59 %
Platelets: 239 10*3/uL (ref 150–400)
RBC: 2.54 MIL/uL — ABNORMAL LOW (ref 3.87–5.11)
RDW: 14.6 % (ref 11.5–15.5)
WBC: 7.3 10*3/uL (ref 4.0–10.5)
nRBC: 0 % (ref 0.0–0.2)

## 2021-04-13 LAB — GLUCOSE, CAPILLARY
Glucose-Capillary: 84 mg/dL (ref 70–99)
Glucose-Capillary: 75 mg/dL (ref 70–99)
Glucose-Capillary: 101 mg/dL — ABNORMAL HIGH (ref 70–99)
Glucose-Capillary: 122 mg/dL — ABNORMAL HIGH (ref 70–99)

## 2021-04-13 LAB — MAGNESIUM: Magnesium: 2 mg/dL (ref 1.7–2.4)

## 2021-04-13 MED ORDER — ACETAMINOPHEN 325 MG PO TABS: 325.0000 mg | ORAL_TABLET | ORAL | Status: AC | PRN

## 2021-04-13 NOTE — Progress Notes (Signed)
Per WOC Order:   "If the [facility] machine alarms and the alarm cannot be stopped, remove the VAC dressing and place a saline moistened gauze into the wound, cover with an ABD pad. VAC to be stopped 5/6 anyway."  This Designer, multimedia attempted to troubleshoot VAC multiple times, but attempts were unsuccessful.   Per order, VAC removed. Scant drainage noted at site. Saline moistened gauze was placed into the wound and covered with ABD pad. Colostomy bag remains in place and secured.

## 2021-04-13 NOTE — Progress Notes (Signed)
Occupational Therapy Session Note  Patient Details  Name: Desiree Madden MRN: [id number] Date of Birth: 02/22/1951  Today's Date: 04/13/2021 OT Individual Time: 1005-1035 OT Individual Time Calculation (min): 30 min    Short Term Goals: Week 1:  OT Short Term Goal 1 (Week 1): Pt will be able to sit to EOB with S to prepare for self care. OT Short Term Goal 1 - Progress (Week 1): Met OT Short Term Goal 2 (Week 1): Pt will be able to rise to stand with min A to prepare for LB self care. OT Short Term Goal 2 - Progress (Week 1): Met OT Short Term Goal 3 (Week 1): Pt will be able to stand with min A while pulling pants over hips. OT Short Term Goal 3 - Progress (Week 1): Met OT Short Term Goal 4 (Week 1): Pt will complete transfer to toilet with min A. OT Short Term Goal 4 - Progress (Week 1): Met Week 2:  OT Short Term Goal 1 (Week 2): Pt will complete all bathing tasks with setup sitting sink side. OT Short Term Goal 2 (Week 2): Pt will complete toilet transfers with CGA and least restrictive AD. OT Short Term Goal 3 (Week 2): Pt will complete LB dressing with setup. OT Short Term Goal 4 (Week 2): Pt will increase standing tolerance to 6 mins for ADL and IADL tasks.  Skilled Therapeutic Interventions/Progress Updates:    Pt received in bed ready to get bathed and dressed. Her wound vac had been removed so she did not have a tube to work around.  She was able to move around the room efficiently and only with S using her RW.  Pt completed grooming, toileting, bathing and dressing all with S.   Pt resting in bed at the end of the session with all needs met and alarm set.   Therapy Documentation Precautions:  Precautions Precautions: Fall,Other (comment) Precaution Comments: Abdominal wound vac. LLQ ostomy. L foot drop Restrictions Weight Bearing Restrictions: No  Pain: Pain Assessment Pain Score: 0-No pain ADL: ADL Eating: Supervision/safety Grooming: Modified independent Where  Assessed-Grooming: Standing at sink Upper Body Bathing: Setup Where Assessed-Upper Body Bathing: Sitting at sink Lower Body Bathing: Supervision/safety Where Assessed-Lower Body Bathing: Sitting at sink,Standing at sink Upper Body Dressing: Setup Where Assessed-Upper Body Dressing: Sitting at sink Lower Body Dressing: Supervision/safety Where Assessed-Lower Body Dressing: Wheelchair,Standing at sink,Sitting at sink Toileting: Supervision/safety Where Assessed-Toileting: Glass blower/designer: Close supervision Toilet Transfer Method: Counselling psychologist: Grab bars   Therapy/Group: Individual Therapy  [place] 04/13/2021, 1:07 PM

## 2021-04-13 NOTE — Consult Note (Signed)
[place] Nurse wound follow up Wound Vac Restart Wound type: Surgical Wound bed: Pink granulation tissue Drainage (amount, consistency, odor): small amount of serosanguinous drainage on dressing  Periwound: Intact Dressing procedure/placement/frequency: Saline gauze removed. 2 pieces of black foam applied with small piece of drape applied to the skin between the upper and lower wound to protect intact skin. Foam pieces touching to obtain seal. Drape applied, immediate seal obtained. I do not think she will have much drainage over the weekend. Vac could possibly be replaced with moistened saline gauze and ABD pads before going home.   WOC is signing off  Jocelyn Lamer L. Tamala Julian, MSN, RN, [place], Lysle Pearl, [facility] Wound Treatment Associate Pager [phone]

## 2021-04-13 NOTE — Progress Notes (Signed)
Occupational Therapy Session Note  Patient Details  Name: Desiree Madden MRN: [id number] Date of Birth: 03/28/1951  Today's Date: 04/13/2021 OT Individual Time: 1301-1402 OT Individual Time Calculation (min): 61 min    Short Term Goals: Week 1:  OT Short Term Goal 1 (Week 1): Pt will be able to sit to EOB with S to prepare for self care. OT Short Term Goal 1 - Progress (Week 1): Met OT Short Term Goal 2 (Week 1): Pt will be able to rise to stand with min A to prepare for LB self care. OT Short Term Goal 2 - Progress (Week 1): Met OT Short Term Goal 3 (Week 1): Pt will be able to stand with min A while pulling pants over hips. OT Short Term Goal 3 - Progress (Week 1): Met OT Short Term Goal 4 (Week 1): Pt will complete transfer to toilet with min A. OT Short Term Goal 4 - Progress (Week 1): Met  Skilled Therapeutic Interventions/Progress Updates:    Pt received in room with daughter present for family education and consented to OT tx. Session focused on ADL education and demonstration. Pt deom'd toileting, functional transfers and mobility and dressing for daughter. Pt completing ADLs at setup/SUP level. Educated dtr on bathing, when pt is able to shower to be with her when getting gin and out of shower, as well as be within ear shot while bathing. Pt plans on washing up sink side upon initial discharge home and is able to complete with setup. AFO tech came in during session to issue AFO, dat and pt educated on when to wear it and it's purpose. Pt and daughter taken down to ADL apartment to educate on tub transfers with RW. Educated pt and daughter on tub transfer bench if she is more comfortable with that once she is home and able to shower. After tx, pt and daughter left in room with RN and nurse tech present with all needs met and all OT questions answered.   Therapy Documentation Precautions:  Precautions Precautions: Fall,Other (comment) Precaution Comments: Abdominal wound vac. LLQ  ostomy. L foot drop Restrictions Weight Bearing Restrictions: No   Pain: Pain Assessment Pain Score: 0-No pain   Therapy/Group: Individual Therapy     04/13/2021, 1:35 PM 

## 2021-04-13 NOTE — Progress Notes (Signed)
Patient ID: Desiree Madden, female   DOB: 04/21/1951, 70 y.o.   MRN: [id number] Follow up with the patient regarding pending discharge and wound vac status. Wound vac removed last p.m. and due to drainage, restarted today. Hopeful to get removed permanently for discharge. Lurline Idol site looks good. Patient is doing well overall and feels "anxious but ready" for discharge. Colostomy supplies at bedside. Continue to follow along to discharge to address educational needs.  Margarito Liner

## 2021-04-13 NOTE — Progress Notes (Signed)
Physical Therapy Session Note  Patient Details  Name: Desiree Madden MRN: [id number] Date of Birth: Mar 01, 1951  Today's Date: 04/13/2021 PT Individual Time: 0865-7846 + [id number] PT Individual Time Calculation (min): 53 min  + 55 min  Short Term Goals: Week 2:  PT Short Term Goal 1 (Week 2): STG = LTG due to ELOS  Skilled Therapeutic Interventions/Progress Updates:     1st session: Pt greeted supine in bed at start of session, agreeable to therapy. No reports of pain. Reports poor nights rest as nursing staff removed her wound vac. Supine<>sit mod I with bed features. Stand<>pivot transfer with supervision from EOB to w/c with good understanding of safety awareness. Pt requesting to use bathroom to void. Wheeled in bathroom and performed stand<>pivot with supervision and use of grab bars, able to manage lower body dressing in standing with supervision. Pt continent of bladder, charted in flowsheets. W/c transport for time management to main rehab gym.  Retrieved EMPI e-stim for NMES to L ankle for DF re-ed. Applied stim pads to anterior tib muscle group with pre-set setting at $RemoveBe'[id number]$  frequency. Instructed patient on purpose for e-stim and also instructed to perform AAROM for ankle DF. Unfortunately, unable to produce adequate ankle DF despite e-stim - efforts made to move pads and adjust settings but no success.   Ace wrapped L foot for DF assist and performed gait training ~261ft with RW and supervision - pt with no knee buckling or LOB with cues only needed for "heel-toe" L foot progressions.  Stair training up/down 2x4 steps with CGA and bilateral hand rails. Min cues needed for stepping up with R foot and stepping down with L foot. Effortful but capable and will benefit from family ed session this afternoon.  MD arriving for morning rounds - discussed DC planning and AFO.   Wheeled back upstairs for time management to her room. Stand<>pivot with supervision and bed rail from w/c to EOB. She  remained long-sitting in bed with needs in reach and bed alarm on.   2nd session: Pt received sitting in w/c, daughter at bedside, pt consented to PT tx. No reports of pain. Pt now with AFO ([place] walk-on) delivered to room and had it on throughout session. Focus of session to perform family ed/training with daughter to prepare for upcoming DC.   Daughter with several reasonable questions regarding DC planning, activity restrictions, home safety, role of f/u therapies, donning/doffing AFO, DME rec's, etc. Lengthy discussion regarding these topics. All questions and concerns addressed and daughter/patient appreciative. Daughter took notes on phone during session for family training.  W/c transport for time management to main rehab gym. Pt negotiated up/down 2x4 steps (seated rest) with minA and bilateral hand rail support. Cues needed for ascending with R foot up and L foot down. Of note, pt with x1 LOB posteriorly requiring modA for correction on 3rd step going up - LOB contributed to difficulty producing power to rise on R foot to the 3rd step and her L foot getting caught - pt contributes to not being used to AFO bracing. 2nd trial of stairs she did not have a similar LOB and demonstrates improved ability to power to rise in stepping motions. Daughter present and education provided on guarding technique.  Gait x156ft with supervision and RW from main gym to ortho gym on level ground - cues for "heel-toe" progressions on L foot - only x1 toe catch on L towards end of gait trial.   Reviewed car transfers with car height set  to simulate their [facility] HR-V - pt completed this with supervision and cues needed for safety approach and correct sequencing (sit first, then slide legs in).   Pt transported back upstairs via w/c to her room and remained seated in w/c at end of session with daughter at bedside. Reviewed home safety and DC planning - pt and daughter with no further questions, feel confident  regarding upcoming discharge.  Therapy Documentation Precautions:  Precautions Precautions: Fall,Other (comment) Precaution Comments: Abdominal wound vac. LLQ ostomy. L foot drop Restrictions Weight Bearing Restrictions: No General:    Therapy/Group: Individual Therapy  Hari Casaus P Kanaya Gunnarson PT 04/13/2021, 7:34 AM

## 2021-04-13 NOTE — Progress Notes (Signed)
Patient ID: Desiree Madden, female   DOB: 1951/04/01, 70 y.o.   MRN: [id number]  Per PA, pt will require wound vac at time of discharge due to the amount of drainage.   SW waiting on updates from [facility] to determine if the branch can handle a wound vac.   SW called pt dtr [name] [phone]) to inform on above. She understands that if this [facility] is unable to accept, we will find another agency.   SW sent order for wound vac to [facility] for review. She is aware pt will d/c on Monday (5/9).  Loralee Pacas, MSW, [place] Office: [phone] Cell: [phone] Fax: [phone]

## 2021-04-13 NOTE — Progress Notes (Signed)
[place] PHYSICAL MEDICINE & REHABILITATION PROGRESS NOTE  Subjective/Complaints: Patient seen standing up, working with therapies, navigating stairs, and later sitting down.  She states she did not sleep well overnight because she had panic attacks thinking about discharge on Monday.  She also states that her wound VAC was malfunctioning and had to be removed overnight.  Otherwise, she states she is doing well.  Discussed focusing on positivity.  Discussed progress with therapies and plans for AFO delivery later today.  She notes frequent stooling.  ROS: Denies CP, SOB, N/V/D  Objective: Vital Signs: Blood pressure (!) 108/58, pulse 72, temperature 98.7 F (37.1 C), temperature source Oral, resp. rate 14, weight 61.8 kg, SpO2 96 %. DG Orthopantogram  Result Date: 04/11/2021 CLINICAL DATA:  Poor dentition, broken and cracked teeth EXAM: ORTHOPANTOGRAM/PANORAMIC COMPARISON:  None. FINDINGS: Panorex view of the mandible was performed. There are no acute or destructive bony lesions. There is extremely poor dentition, with fractured and erosive changes of the remaining teeth diffusely. Visualized sinuses are clear. IMPRESSION: 1. Extremely poor dentition. No acute or destructive bony abnormality. Electronically Signed   By: Randa Ngo M.D.   On: 04/11/2021 15:15   Recent Labs    04/13/21 0600  WBC 7.3  HGB 7.9*  HCT 24.6*  PLT 239   Recent Labs    04/13/21 0600  NA 140  K 3.8  CL 107  CO2 28  GLUCOSE 84  BUN 15  CREATININE 1.07*  CALCIUM 8.8*    Intake/Output Summary (Last 24 hours) at 04/13/2021 0921 Last data filed at 04/13/2021 0836 Gross per 24 hour  Intake 560 ml  Output --  Net 560 ml        Physical Exam: BP (!) 108/58 (BP Location: Left Arm)   Pulse 72   Temp 98.7 F (37.1 C) (Oral)   Resp 14   Wt 61.8 kg   SpO2 96%   BMI 23.37 kg/m   Constitutional: No distress . Vital signs reviewed. HENT: Normocephalic.  Atraumatic. Poor dentition Eyes: EOMI. No  discharge. Cardiovascular: No JVD.  RRR. Respiratory: Normal effort.  No stridor.  Bilateral clear to auscultation. GI: Non-distended.  BS +. Skin: Warm and dry.   Left lower quadrant ostomy Abdominal wound with dressing CDI Neck with healing stoma Psych: Normal mood.  Normal behavior. Musc: No edema in extremities.  No tenderness in extremities. Neuro: Alert Motor: Grossly 4+/5 throughout, except for left lower extremity dorsiflexion 0/5, unchanged  Assessment/Plan: 1. Functional deficits which require 3+ hours per day of interdisciplinary therapy in a comprehensive inpatient rehab setting.  Physiatrist is providing close team supervision and 24 hour management of active medical problems listed below.  Physiatrist and rehab team continue to assess barriers to discharge/monitor patient progress toward functional and medical goals   Care Tool:  Bathing    Body parts bathed by patient: Right arm,Left arm,Chest,Right upper leg,Left upper leg,Face,Front perineal area,Buttocks,Right lower leg,Left lower leg   Body parts bathed by helper: Right lower leg,Left lower leg Body parts n/a: Abdomen   Bathing assist Assist Level: Supervision/Verbal cueing     Upper Body Dressing/Undressing Upper body dressing   What is the patient wearing?: Bra,Dress    Upper body assist Assist Level: Set up assist    Lower Body Dressing/Undressing Lower body dressing      What is the patient wearing?: Underwear/pull up     Lower body assist Assist for lower body dressing: Supervision/Verbal cueing     Toileting Toileting  Toileting assist Assist for toileting: Supervision/Verbal cueing (wtih RW)     Transfers Chair/bed transfer  Transfers assist     Chair/bed transfer assist level: Supervision/Verbal cueing Chair/bed transfer assistive device: Programmer, multimedia   Ambulation assist      Assist level: Supervision/Verbal cueing Assistive device:  Walker-rolling Max distance: 160 ft   Walk 10 feet activity   Assist     Assist level: Contact Guard/Touching assist Assistive device: Walker-rolling   Walk 50 feet activity   Assist Walk 50 feet with 2 turns activity did not occur: Safety/medical concerns  Assist level: Contact Guard/Touching assist Assistive device: Walker-rolling    Walk 150 feet activity   Assist Walk 150 feet activity did not occur: Safety/medical concerns  Assist level: Contact Guard/Touching assist Assistive device: Walker-rolling    Walk 10 feet on uneven surface  activity   Assist Walk 10 feet on uneven surfaces activity did not occur: Safety/medical concerns         Wheelchair     Assist Will patient use wheelchair at discharge?: No   Wheelchair activity did not occur: N/A         Wheelchair 50 feet with 2 turns activity    Assist    Wheelchair 50 feet with 2 turns activity did not occur: N/A       Wheelchair 150 feet activity     Assist  Wheelchair 150 feet activity did not occur: N/A        Medical Problem List and Plan: 1.  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   Continue CIR, patient and family education 2.  Antithrombotics: Continue Eliquis             -antiplatelet therapy: N/A 3. Pain Management: Decrease Oxcodone to q12H prn.   Controlled with meds on 5/6 4. Mood: Team support, encouraged positivity             -antipsychotic agents: N/A 5. Neuropsych: This patient is cable of making decisions on her own behalf. 6. Skin/Wound Care: Routine pressure relief measures.   +VAC 7. Fluids/Electrolytes/Nutrition: Monitor I/Os.             Continue Vitamin C/Multivitamin 8. Perforated diverticulitis s/p sigmoid colectomy w/end colostomy: WOC following to education.   +Ostomy  Senna DC'd on 5/6 9. NSTEMI/ICM/Chronic combined CHF: Compensated. On low dose Toprol  XL Filed Weights   04/11/21 0449 04/11/21 0533 04/13/21 0500  Weight: 58.3 kg 59 kg 61.8 kg   ?  Trending up on 5/6-no signs of fluid overload, will consider diuretics if persistent 10. SVT/PAF: Monitor HR tid--continue amiodarone, toprol and Eliquis.  Controlled on 5/6, continue to monitor  Monitor increase activity 11. VDRF s/p trach: Monitor respiratory status  Continue cuffed #6 shiley  Decannulated without issue on 5/3 12. Hypokalemia/Hypomagnesemia: Due to malnutrition/critical illness             Monitor and supplement as indicated.             Continue supplements to help with nutritional state.             Continue Marinol-->monitor for SE.   Potassium 3.8 on 5/6  Magnesium 2.0 on 4/23, labs pending 13. Situational depression: Continue Remeron for smood stabilization              --Xanax prn to manage anxiety.  14. Cystopexy/Urinary retention:   Added flomax  Improving 15. Chronic insomnia: Continue Xanax prn. Melatonin ineffective.              --Remeron  16. Left foot drop   PRAFO  Continue to await AFO 17.  Dysphagia  Regular thins now with med modifications  Monitor p.o. intake  Continue to advance diet as tolerated 18.  CKD stage II/III  Creatinine 1.07 on 5/6  Continue to monitor 19. Poor dentition with broken teeth  Xray showing poor dentition, but no other abnormalities 20.  Acute blood loss anemia  Hemoglobin 7.9 on 5/6  Continue to monitor  LOS: 10 days A FACE TO FACE EVALUATION WAS PERFORMED  Desiree Madden Lorie Phenix 04/13/2021, 9:21 AM

## 2021-04-13 NOTE — Consult Note (Addendum)
[place] Nurse ostomy follow up Patient receiving care in [facility] Patient to be discharged on Monday 04/16/21 Stoma type/location: LUQ colostomy Stomal assessment/size: 1 14" pink moist and budded. Peristomal assessment:  Intact Output: Brown mushy  Ostomy pouching: 2pc. 2 1/4" pouch ([place]# [phone]). Skin barrier Kellie Simmering # [phone]) Barrier ring Kellie Simmering # [phone]). Education provided: Patient was able to complete the entire pouch change today with very little assistance. She prepared the new pouch by cutting to size, closed the pouch, removed old pouch, cleaned around the stoma, placed the barrier ring around the stoma, skin barrier and attached pouch. Educated patient on changing the pouch at least twice a week, using only water to clean and empty when about 1/3 of the way full. Enrolled patient in [place] Start Discharge program: Yes  [place] signing off at this time.   Cathlean Marseilles Tamala Julian, MSN, RN, [place], Lysle Pearl, [facility] Wound Treatment Associate Pager [phone]

## 2021-04-14 LAB — GLUCOSE, CAPILLARY
Glucose-Capillary: 96 mg/dL (ref 70–99)
Glucose-Capillary: 69 mg/dL — ABNORMAL LOW (ref 70–99)
Glucose-Capillary: 83 mg/dL (ref 70–99)
Glucose-Capillary: 92 mg/dL (ref 70–99)

## 2021-04-14 NOTE — Progress Notes (Signed)
Patient had a hypoglycemic blood sugar of 69 @ 1150am. 4 oz juice given. BS recheck is 83 at 1204pm. MD notified and made aware. Patient is asymptomatic.

## 2021-04-14 NOTE — Progress Notes (Signed)
[place] PHYSICAL MEDICINE & REHABILITATION PROGRESS NOTE  Subjective/Complaints:  Pt reports is nervous about D/c Monday but also excited.  Also asking, since hadn't planned to go home with [facility], why is was put back on- WOC says can come off, looks good enough- will order wound care per WOC recs- saline soaked gauze and ABD pads- daily and prn.  Has colostomy- still putting out.  Also Vac is itching around abd skin.    Pt denies SOB, abd pain, CP, N/V/C/D, and vision changes   Objective: Vital Signs: Blood pressure 106/64, pulse 75, temperature 98.3 F (36.8 C), temperature source Oral, resp. rate 16, weight 55.4 kg, SpO2 99 %. No results found. Recent Labs    04/13/21 0600  WBC 7.3  HGB 7.9*  HCT 24.6*  PLT 239   Recent Labs    04/13/21 0600  NA 140  K 3.8  CL 107  CO2 28  GLUCOSE 84  BUN 15  CREATININE 1.07*  CALCIUM 8.8*    Intake/Output Summary (Last 24 hours) at 04/14/2021 1416 Last data filed at 04/14/2021 1302 Gross per 24 hour  Intake 840 ml  Output --  Net 840 ml        Physical Exam: BP 106/64   Pulse 75   Temp 98.3 F (36.8 C) (Oral)   Resp 16   Wt 55.4 kg   SpO2 99%   BMI 20.95 kg/m     General: awake, alert, appropriate, NAD HENT: conjugate gaze; oropharynx moist; poor dentition CV: regular rate; no JVD Pulmonary: CTA B/L; no W/R/R- good air movement GI: soft, NT, ND, (+)BS; has wound VAC- some areas irritated/red around edges- but not like has infection- just irritated locally; Colostomy putting out stool- a little- just emptied.  Psychiatric: appropriate today- slightly anxious, but calm overall Neurological: Ox3  Skin: Warm and dry.   Left lower quadrant ostomy Abdominal wound with dressing CDI Neck with healing stoma Psych: Normal mood.  Normal behavior. Musc: No edema in extremities.  No tenderness in extremities. Neuro: Alert Motor: Grossly 4+/5 throughout, except for left lower extremity dorsiflexion 0/5,  unchanged  Assessment/Plan: 1. Functional deficits which require 3+ hours per day of interdisciplinary therapy in a comprehensive inpatient rehab setting.  Physiatrist is providing close team supervision and 24 hour management of active medical problems listed below.  Physiatrist and rehab team continue to assess barriers to discharge/monitor patient progress toward functional and medical goals   Care Tool:  Bathing    Body parts bathed by patient: Right arm,Left arm,Chest,Right upper leg,Left upper leg,Face,Front perineal area,Buttocks,Right lower leg,Left lower leg   Body parts bathed by helper: Right lower leg,Left lower leg Body parts n/a: Abdomen   Bathing assist Assist Level: Supervision/Verbal cueing     Upper Body Dressing/Undressing Upper body dressing   What is the patient wearing?: Bra,Dress    Upper body assist Assist Level: Set up assist    Lower Body Dressing/Undressing Lower body dressing      What is the patient wearing?: Pants     Lower body assist Assist for lower body dressing: Supervision/Verbal cueing     Toileting Toileting    Toileting assist Assist for toileting: Supervision/Verbal cueing     Transfers Chair/bed transfer  Transfers assist     Chair/bed transfer assist level: Supervision/Verbal cueing Chair/bed transfer assistive device: Programmer, multimedia   Ambulation assist      Assist level: Supervision/Verbal cueing Assistive device: Walker-rolling Max distance: 160 ft  Walk 10 feet activity   Assist     Assist level: Contact Guard/Touching assist Assistive device: Walker-rolling   Walk 50 feet activity   Assist Walk 50 feet with 2 turns activity did not occur: Safety/medical concerns  Assist level: Contact Guard/Touching assist Assistive device: Walker-rolling    Walk 150 feet activity   Assist Walk 150 feet activity did not occur: Safety/medical concerns  Assist level: Contact  Guard/Touching assist Assistive device: Walker-rolling    Walk 10 feet on uneven surface  activity   Assist Walk 10 feet on uneven surfaces activity did not occur: Safety/medical concerns         Wheelchair     Assist Will patient use wheelchair at discharge?: No   Wheelchair activity did not occur: N/A         Wheelchair 50 feet with 2 turns activity    Assist    Wheelchair 50 feet with 2 turns activity did not occur: N/A       Wheelchair 150 feet activity     Assist  Wheelchair 150 feet activity did not occur: N/A        Medical Problem List and Plan: 1.  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   -con't PT and OT- WOC has signed off; d/c Monday 2.  Antithrombotics: Continue Eliquis             -antiplatelet therapy: N/A 3. Pain Management: Decrease Oxcodone to q12H prn.   Controlled with meds on 5/6  5/7- pain controlled- con't regimen 4. Mood: Team support, encouraged positivity             -antipsychotic agents: N/A 5. Neuropsych: This patient is cable of making decisions on her own behalf. 6. Skin/Wound Care: Routine pressure relief measures.   +VAC  5/7- will d/c VAC_ wasn't planned, so couldn't take home- and want pt to have time to learn wound care before d/c. - wrote orders.  7. Fluids/Electrolytes/Nutrition: Monitor I/Os.             Continue Vitamin C/Multivitamin 8. Perforated diverticulitis s/p sigmoid colectomy w/end colostomy: WOC following to education.   +Ostomy  Senna DC'd on 5/6 9. NSTEMI/ICM/Chronic combined CHF: Compensated. On low dose Toprol XL Filed Weights   04/11/21 0533 04/13/21 0500 04/14/21 0500  Weight: 59 kg 61.8 kg 55.4 kg   ?  Trending up on 5/6-no signs of fluid overload, will consider diuretics if persistent  5/7- weight back down- 55.4 kg- not sure if right either, but will monitor 10. SVT/PAF: Monitor HR tid--continue  amiodarone, toprol and Eliquis.  Controlled on 5/6, continue to monitor  Monitor increase activity 11. VDRF s/p trach: Monitor respiratory status  Continue cuffed #6 shiley  Decannulated without issue on 5/3  5/7- healing well- con't wound care 12. Hypokalemia/Hypomagnesemia: Due to malnutrition/critical illness             Monitor and supplement as indicated.             Continue supplements to help with nutritional state.             Continue Marinol-->monitor for SE.   Potassium 3.8 on 5/6  Magnesium 2.0 on 4/23, 2.0 also 5/6- con't regimen 13. Situational depression: Continue Remeron for smood stabilization              --Xanax prn to manage anxiety.  14. Cystopexy/Urinary retention:   Added flomax  Improving 15. Chronic insomnia: Continue Xanax prn. Melatonin ineffective.              --Remeron  16. Left foot drop   PRAFO  Continue to await AFO 17.  Dysphagia  Regular thins now with med modifications  Monitor p.o. intake  Continue to advance diet as tolerated 18.  CKD stage II/III  Creatinine 1.07 on 5/6  Continue to monitor 19. Poor dentition with broken teeth  Xray showing poor dentition, but no other abnormalities 20.  Acute blood loss anemia  Hemoglobin 7.9 on 5/6  Continue to monitor  LOS: 11 days A FACE TO FACE EVALUATION WAS PERFORMED  Desiree Madden 04/14/2021, 2:16 PM

## 2021-04-14 NOTE — Plan of Care (Signed)
  Problem: Consults Goal: RH GENERAL PATIENT EDUCATION Description: See Patient Education module for education specifics. Outcome: Progressing   Problem: RH BOWEL ELIMINATION Goal: RH STG MANAGE BOWEL WITH ASSISTANCE Description: STG Manage Bowel with min Assistance. Outcome: Progressing   Problem: RH BLADDER ELIMINATION Goal: RH STG MANAGE BLADDER WITH ASSISTANCE Description: STG Manage Bladder With mod I  Assistance Outcome: Progressing   Problem: RH SKIN INTEGRITY Goal: RH STG SKIN FREE OF INFECTION/BREAKDOWN Description: With min assist Outcome: Progressing   Problem: RH SAFETY Goal: RH STG ADHERE TO SAFETY PRECAUTIONS W/ASSISTANCE/DEVICE Description: STG Adhere to Safety Precautions With cues/reminders Assistance/Device. Outcome: Progressing   Problem: RH PAIN MANAGEMENT Goal: RH STG PAIN MANAGED AT OR BELOW PT'S PAIN GOAL Description: At or below level 4 Outcome: Progressing   Problem: Education: Goal: Knowledge of ostomy care will improve Description: Patient and daughter will be able to manage ostomy independently Outcome: Progressing Goal: Understanding of discharge needs will improve Description: Patient and daughter will be able to manage care needs independently Outcome: Progressing

## 2021-04-14 NOTE — Progress Notes (Signed)
Hypoglycemic Event  CBG: 69  Treatment: 4 oz orange juice  Symptoms:Asymptomatic  Follow-up CBG: Time:1204 CBG Result:83  Possible Reasons for Event: Patient not consuming enough carbs  Comments/MD notified: Dr. Toya Smothers

## 2021-04-14 NOTE — Progress Notes (Signed)
Physical Therapy Session Note  Patient Details  Name: Desiree Madden MRN: [id number] Date of Birth: 1951-04-30  Today's Date: 04/14/2021 PT Individual Time: 1015-1100 PT Individual Time Calculation (min): 45 min   Short Term Goals: Week 2:  PT Short Term Goal 1 (Week 2): STG = LTG due to ELOS  Skilled Therapeutic Interventions/Progress Updates:    Pt greeted supine in bed at start of session, agreeable to PT tx. No reports of pain. Pt continues to show moderate anxiety levels regarding DC planning, L AFO, lines/leads, home management, etc. Education and calming provided as pt is doing better off than she realizes. She reports difficulty standing now that she has the L AFO.   Bed mobility completed mod I to sitting EOB. Donned L AFO with modA - pt with increased difficulty preventing her toes from curling. Would benefit from further ed on this.   Pt does require minA for powering to rise and has difficulty weight shifting onto her LLE. Explained to pt that it will take a while for her to get normalized to the feeling of the AFO. Loosened the calf strap and practiced repeated sit<>stands and she was able to progress back to her supervision level.   Provided contact info for [facility] so that pt may reach out at further date if modifications are needed.  Gait training 269ft + 191ft + 153ft with supervision and RW with L AFO on - demo's occasional L toe drag but this primarily happens with distractions and fatigue. Cues for "heel-toe" progressions as needed.  Stair training up/down x4 steps with minA and 2 hand rails - cues needed for stepping up with R foot and stepping down with L foot. She needed minA for powering to rise to step.   Pt returned to her room with totalA in w/c for time management. Completed stand<>pivot transfer with CGA and no AD from w/c to EOB. She remained sitting EOB at end of session with bed alarm on and needs within reach.   Therapy Documentation Precautions:   Precautions Precautions: Fall,Other (comment) Precaution Comments: Abdominal wound vac. LLQ ostomy. L foot drop Restrictions Weight Bearing Restrictions: No General:    Therapy/Group: Individual Therapy  Alger Simons 04/14/2021, 7:38 AM

## 2021-04-14 NOTE — Progress Notes (Signed)
Patient wound vac dressing was removed at approximately 1500. Patient was taught to do wet to to dry dressing with saline moistened gauze and ABD Pads. Patient verbalized understanding and is to complete dressing changes starting tomorrow.

## 2021-04-14 NOTE — Discharge Summary (Signed)
Physical Therapy Discharge Summary  Patient Details  Name: Desiree Madden MRN: [id number] Date of Birth: Feb 11, 1951  Patient has met 9 of 9 long term goals due to improved activity tolerance, improved balance, improved postural control, increased strength, decreased pain and ability to compensate for deficits.  Patient to discharge at an ambulatory level Supervision.   Patient's care partner is independent to provide the necessary physical assistance at discharge.  Reasons goals not met: n/a  Recommendation:  Patient will benefit from ongoing skilled PT services in home health setting to continue to advance safe functional mobility, address ongoing impairments in general strengthening, L foot drop, dynamic standing balance, gait deficits, and home safety in order to  minimize fall risk.  Equipment: RW. L AFO ([employer] walk-on trimmable)  Reasons for discharge: treatment goals met and discharge from hospital  Patient/family agrees with progress made and goals achieved: Yes  PT Discharge Precautions/Restrictions Precautions Precautions: Fall Precaution Comments: Abdominal wound vac. LLQ ostomy. L foot drop Restrictions Weight Bearing Restrictions: No Vision/Perception  Perception Perception: Within Functional Limits Praxis Praxis: Intact  Cognition Overall Cognitive Status: Within Functional Limits for tasks assessed Arousal/Alertness: Awake/alert Orientation Level: Oriented X4 Attention: Focused;Sustained;Selective Focused Attention: Appears intact Sustained Attention: Appears intact Sustained Attention Impairment: Verbal complex;Functional complex Selective Attention: Appears intact Memory: Appears intact Awareness: Appears intact Problem Solving: Appears intact Safety/Judgment: Appears intact Sensation Sensation Light Touch: Appears Intact Hot/Cold: Appears Intact Proprioception: Appears Intact Stereognosis: Appears Intact Coordination Gross Motor Movements are  Fluid and Coordinated: No Fine Motor Movements are Fluid and Coordinated: Yes Coordination and Movement Description: Movement patterns impacted by L foot drop and generalized deconditioning and weakness; however, this has improved since date of evaluation Finger Nose Finger Test: [facility] Motor  Motor Motor: Within Functional Limits Motor - Discharge Observations: Generalized motor weaknes - L foot drop  Mobility Bed Mobility Bed Mobility: Sit to Supine;Supine to Sit;Rolling Right;Rolling Left Rolling Right: Independent Rolling Left: Independent Supine to Sit: Independent Sit to Supine: Independent Transfers Transfers: Sit to Stand;Stand to Sit;Stand Pivot Transfers Sit to Stand: Independent with assistive device Stand to Sit: Independent with assistive device Stand Pivot Transfers: Independent with assistive device Transfer (Assistive device): Rolling walker (and L AFO) Locomotion  Gait Ambulation: Yes Gait Assistance: Supervision/Verbal cueing Gait Distance (Feet): 150 Feet Assistive device: Rolling walker Gait Assistance Details: Verbal cues for sequencing;Verbal cues for technique;Verbal cues for precautions/safety;Verbal cues for gait pattern;Verbal cues for safe use of DME/AE Gait Gait: Yes Gait Pattern: Impaired Gait Pattern: Within Functional Limits;Poor foot clearance - left;Trunk flexed;Decreased dorsiflexion - left;Decreased stance time - left;Decreased step length - left;Step-through pattern (L foot drop) Stairs / Additional Locomotion Stairs: Yes Stairs Assistance: Contact Guard/Touching assist;Minimal Assistance - Patient > 75% Stair Management Technique: Two rails;Step to pattern Number of Stairs: 8 Height of Stairs: 6 Wheelchair Mobility Wheelchair Mobility: No  Trunk/Postural Assessment  Cervical Assessment Cervical Assessment: Within Functional Limits Thoracic Assessment Thoracic Assessment: Within Functional Limits Lumbar Assessment Lumbar Assessment:  Within Functional Limits Postural Control Postural Control: Within Functional Limits  Balance Balance Balance Assessed: Yes Static Sitting Balance Static Sitting - Balance Support: Feet supported;No upper extremity supported Static Sitting - Level of Assistance: 7: Independent Dynamic Sitting Balance Dynamic Sitting - Balance Support: Feet unsupported;During functional activity Dynamic Sitting - Level of Assistance: 7: Independent Static Standing Balance Static Standing - Balance Support: No upper extremity supported Static Standing - Level of Assistance: 5: Stand by assistance Dynamic Standing Balance Dynamic Standing - Balance Support: During functional activity;Bilateral  upper extremity supported Dynamic Standing - Level of Assistance: 5: Stand by assistance Extremity Assessment   RLE Assessment RLE Assessment: Within Functional Limits General Strength Comments: Grossly 4+/5 LLE Assessment LLE Assessment: Exceptions to [facility] General Strength Comments: Ankle PF 4/5, ankle DF 1/5, knee ext 4+/5, hip flex 4-/5    Courtlynn Holloman P Loudon Krakow PT, DPT 04/14/2021, 7:44 AM

## 2021-04-14 NOTE — Progress Notes (Signed)
Occupational Therapy Discharge Summary  Patient Details  Name: Desiree Madden MRN: [id number] Date of Birth: Jan 19, 1951     Patient has met 7 of 7 long term goals due to improved activity tolerance, improved balance and ability to compensate for deficits.  Patient to discharge at overall Supervision level.  Patient's care partner is independent to provide the necessary physical assistance at discharge.    Reasons goals not met: n/a  Recommendation:  Patient will benefit from ongoing skilled OT services in home health setting to continue to advance functional skills in the area of BADL and iADL.  Equipment: BSC  Reasons for discharge: treatment goals met  Patient/family agrees with progress made and goals achieved: Yes  OT Discharge Precautions/Restrictions  Precautions Precautions: Fall Precaution Comments: Abdominal wound vac. LLQ ostomy. L foot drop Restrictions Weight Bearing Restrictions: No  ADL ADL Eating: Independent Grooming: Independent Where Assessed-Grooming: Standing at sink Upper Body Bathing: Setup Where Assessed-Upper Body Bathing: Sitting at sink Lower Body Bathing: Supervision/safety Where Assessed-Lower Body Bathing: Sitting at sink,Standing at sink Upper Body Dressing: Setup Where Assessed-Upper Body Dressing: Sitting at sink Lower Body Dressing: Supervision/safety Where Assessed-Lower Body Dressing: Wheelchair,Standing at sink,Sitting at sink Toileting: Supervision/safety Where Assessed-Toileting: Glass blower/designer: Close supervision Toilet Transfer Method: Counselling psychologist: Grab bars Vision Baseline Vision/History: Wears glasses Wears Glasses: At all times Patient Visual Report: No change from baseline Vision Assessment?: No apparent visual deficits Perception  Perception: Within Functional Limits Praxis Praxis: Intact Cognition Overall Cognitive Status: Within Functional Limits for tasks  assessed Sensation Sensation Light Touch: Appears Intact Hot/Cold: Appears Intact Proprioception: Appears Intact Stereognosis: Appears Intact Coordination Gross Motor Movements are Fluid and Coordinated: No Fine Motor Movements are Fluid and Coordinated: Yes Coordination and Movement Description: Movement patterns impacted by L foot drop and generalized deconditioning and weakness; however, this has improved since date of evaluation Finger Nose Finger Test: [facility] Motor  Motor Motor: Within Functional Limits Motor - Discharge Observations: Generalized motor weaknes - L foot drop Mobility  Transfers Sit to Stand: Independent with assistive device Stand to Sit: Independent with assistive device  Trunk/Postural Assessment  Postural Control Postural Control: Within Functional Limits  Balance Dynamic Sitting Balance Dynamic Sitting - Level of Assistance: 7: Independent Static Standing Balance Static Standing - Level of Assistance: 5: Stand by assistance (with no UE support) Dynamic Standing Balance Dynamic Standing - Level of Assistance: 5: Stand by assistance (with UE support) Extremity/Trunk Assessment RUE Assessment General Strength Comments: 4-/5 LUE Assessment General Strength Comments: 4-/5   [place] 04/14/2021, 4:14 PM

## 2021-04-15 NOTE — Progress Notes (Signed)
Physical Therapy Session Note  Patient Details  Name: Desiree Madden MRN: [id number] Date of Birth: October 29, 1951  Today's Date: 04/15/2021 PT Individual Time: 1345-1445 PT Individual Time Calculation (min): 60 min   Short Term Goals: Week 2:  PT Short Term Goal 1 (Week 2): STG = LTG due to ELOS  Skilled Therapeutic Interventions/Progress Updates:    Pt received seated in w/c in room, agreeable to PT session. No complaints of pain. Pt requesting to use the bathroom, Supervision for toilet transfer with RW. Pt is independent for pericare and clothing management. Pt is Supervision for all transfers with RW throughout session. Assisted pt with donning shoes and L AFO. Car transfer with Supervision and RW. Ambulation up/down ramp with RW and Supervision, across uneven ground with RW and CGA for balance. Ascend/descend 4 x 6" stairs with L handrail to simulate home environment, CGA and step-to gait pattern. Pt able to retrieve objects from floor with use of reacher and RW at Supervision level, cues for safety. Ambulation 2 x 200 ft with RW and Supervision, cues to attend to LLE to prevent toe drag. Pt with no further questions or concerns and feels ready for d/c home tomorrow. Pt left seated in w/c in room with needs in reach at end of session.  Therapy Documentation Precautions:  Precautions Precautions: Fall Precaution Comments: Abdominal wound vac. LLQ ostomy. L foot drop Restrictions Weight Bearing Restrictions: No    Therapy/Group: Individual Therapy   Excell Seltzer, PT, DPT, CSRS  04/15/2021, 2:54 PM

## 2021-04-15 NOTE — Progress Notes (Signed)
Occupational Therapy Session Note  Patient Details  Name: Dell Hurtubise MRN: [id number] Date of Birth: 05/28/1951  Today's Date: 04/15/2021 OT Individual Time: 1115-1200 OT Individual Time Calculation (min): 45 min    Short Term Goals: Week 2:  OT Short Term Goal 1 (Week 2): Pt will complete all bathing tasks with setup sitting sink side. OT Short Term Goal 2 (Week 2): Pt will complete toilet transfers with CGA and least restrictive AD. OT Short Term Goal 3 (Week 2): Pt will complete LB dressing with setup. OT Short Term Goal 4 (Week 2): Pt will increase standing tolerance to 6 mins for ADL and IADL tasks.  Skilled Therapeutic Interventions/Progress Updates:    Pt resting in bed upon arrival. Pt requested to use toilet, wash her hair, sponge bathe at sink, and change clothing. Bed mobility and all functional transfers with supervision. Toileting with supervsion. Pt stood at sink and washed her hair while bending over at sink. Pt completed bathing/dressing with sit<>stand from w/c at sink-supervision. Pt transferred back to bed and remained seated EOB. Bed alarm activated. NT present. Pt pleased with progress and ready for discharge home tomorrow.   Therapy Documentation Precautions:  Precautions Precautions: Fall Precaution Comments: Abdominal wound vac. LLQ ostomy. L foot drop Restrictions Weight Bearing Restrictions: No  Pain: Pt denies pain this morning   Therapy/Group: Individual Therapy  Leroy Libman 04/15/2021, 12:14 PM

## 2021-04-15 NOTE — Progress Notes (Signed)
Patient was educated on abdomen wound dressing change and patient performed the dressing change herself. Patient was educated on ostomy and performing a complete change of pouch and barrier ring. Patient performed the change herself. Daughter was present and educated also. Patient and daughter verbalized understanding of both tasks. Patient would like to speak to the wound care team about ostomy and supplies.

## 2021-04-15 NOTE — Progress Notes (Signed)
CBG did not transfer:  PM CBG:126 AM CBG: 78

## 2021-04-16 LAB — BASIC METABOLIC PANEL
Anion gap: 8 (ref 5–15)
BUN: 15 mg/dL (ref 8–23)
CO2: 26 mmol/L (ref 22–32)
Calcium: 8.7 mg/dL — ABNORMAL LOW (ref 8.9–10.3)
Chloride: 104 mmol/L (ref 98–111)
Creatinine, Ser: 1.37 mg/dL — ABNORMAL HIGH (ref 0.44–1.00)
GFR, Estimated: 42 mL/min — ABNORMAL LOW (ref >60)
Glucose, Bld: 145 mg/dL — ABNORMAL HIGH (ref 70–99)
Potassium: 3.7 mmol/L (ref 3.5–5.1)
Sodium: 138 mmol/L (ref 135–145)

## 2021-04-16 LAB — CBC WITH DIFFERENTIAL/PLATELET
Abs Immature Granulocytes: 0.05 10*3/uL (ref 0.00–0.07)
Basophils Absolute: 0.1 10*3/uL (ref 0.0–0.1)
Basophils Relative: 1 %
Eosinophils Absolute: 0.1 10*3/uL (ref 0.0–0.5)
Eosinophils Relative: 1 %
HCT: 25.5 % — ABNORMAL LOW (ref 36.0–46.0)
Hemoglobin: 8.3 g/dL — ABNORMAL LOW (ref 12.0–15.0)
Immature Granulocytes: 1 %
Lymphocytes Relative: 20 %
Lymphs Abs: 1.8 10*3/uL (ref 0.7–4.0)
MCH: 31.7 pg (ref 26.0–34.0)
MCHC: 32.5 g/dL (ref 30.0–36.0)
MCV: 97.3 fL (ref 80.0–100.0)
Monocytes Absolute: 0.7 10*3/uL (ref 0.1–1.0)
Monocytes Relative: 7 %
Neutro Abs: 6.6 10*3/uL (ref 1.7–7.7)
Neutrophils Relative %: 70 %
Platelets: 258 10*3/uL (ref 150–400)
RBC: 2.62 MIL/uL — ABNORMAL LOW (ref 3.87–5.11)
RDW: 14.8 % (ref 11.5–15.5)
WBC: 9.3 10*3/uL (ref 4.0–10.5)
nRBC: 0 % (ref 0.0–0.2)

## 2021-04-16 LAB — GLUCOSE, CAPILLARY: Glucose-Capillary: 126 mg/dL — ABNORMAL HIGH (ref 70–99)

## 2021-04-16 MED ORDER — ATORVASTATIN CALCIUM 20 MG PO TABS: 20.0000 mg | ORAL_TABLET | Freq: Every day | ORAL | 0 refills | Status: AC

## 2021-04-16 MED ORDER — CHLORHEXIDINE GLUCONATE 0.12% ORAL RINSE (MEDLINE KIT): 15.0000 mL | Freq: Two times a day (BID) | OROMUCOSAL | 0 refills | Status: AC

## 2021-04-16 MED ORDER — DRONABINOL 2.5 MG PO CAPS: 2.5000 mg | ORAL_CAPSULE | Freq: Every day | ORAL | 0 refills | Status: DC

## 2021-04-16 MED ORDER — LEVOTHYROXINE SODIUM 75 MCG PO TABS: 75.0000 ug | ORAL_TABLET | Freq: Every day | ORAL | 0 refills | Status: DC

## 2021-04-16 MED ORDER — PANTOPRAZOLE SODIUM 40 MG PO TBEC: 40.0000 mg | DELAYED_RELEASE_TABLET | Freq: Every day | ORAL | 0 refills | Status: AC

## 2021-04-16 MED ORDER — ASCORBIC ACID 500 MG PO TABS: 500.0000 mg | ORAL_TABLET | Freq: Two times a day (BID) | ORAL | 0 refills | Status: AC

## 2021-04-16 MED ORDER — OXYCODONE HCL 5 MG PO TABS: 5.0000 mg | ORAL_TABLET | Freq: Four times a day (QID) | ORAL | 0 refills | Status: DC | PRN

## 2021-04-16 MED ORDER — METOPROLOL SUCCINATE ER 25 MG PO TB24: 12.5000 mg | ORAL_TABLET | Freq: Every day | ORAL | 0 refills | Status: DC

## 2021-04-16 MED ORDER — TAMSULOSIN HCL 0.4 MG PO CAPS: 0.4000 mg | ORAL_CAPSULE | Freq: Every day | ORAL | 0 refills | Status: DC

## 2021-04-16 MED ORDER — MIRTAZAPINE 15 MG PO TABS: 15.0000 mg | ORAL_TABLET | Freq: Every day | ORAL | 0 refills | Status: AC

## 2021-04-16 MED ORDER — APIXABAN 5 MG PO TABS: 5.0000 mg | ORAL_TABLET | Freq: Two times a day (BID) | ORAL | 0 refills | Status: DC

## 2021-04-16 MED ORDER — ALPRAZOLAM 0.25 MG PO TABS: 0.2500 mg | ORAL_TABLET | Freq: Two times a day (BID) | ORAL | 0 refills | Status: AC | PRN

## 2021-04-16 MED ORDER — AMIODARONE HCL 200 MG PO TABS: 200.0000 mg | ORAL_TABLET | Freq: Two times a day (BID) | ORAL | 0 refills | Status: DC

## 2021-04-16 NOTE — Discharge Instructions (Signed)
Inpatient Rehab Discharge Instructions  Desiree Madden Discharge date and time:    Activities/Precautions/ Functional Status: Activity: no lifting, driving, or strenuous exercise till cleared by MD Diet: regular diet Wound Care: Routine ostomy care. Cleanse abdominal incision with normal saline, then pack with damp to dry dressing. Change twice a day. Contact surgeon if you develop any problems with your incision/wound--redness, swelling, increase in pain, increase in drainage or if you develop fever or chills.     Functional status:  ___ No restrictions     ___ Walk up steps independently _X__ 24/7 supervision/assistance   ___ Walk up steps with assistance ___ Intermittent supervision/assistance  ___ Bathe/dress independently ___ Walk with walker     _X__ Bathe/dress with assistance ___ Walk Independently    ___ Shower independently _X__ Walk with supervision     ___ Shower with assistance _X__ No alcohol     ___ Return to work/school ________  Special Instructions:     COMMUNITY REFERRALS UPON DISCHARGE:    Home Health:   PT. OT, RN                   Agency:[facility] [place] Equipment/Items Ordered:3 IN 1 & ROLLING WALKER                                                 Agency/Supplier: [facility]  [phone]    My questions have been answered and I understand these instructions. I will adhere to these goals and the provided educational materials after my discharge from the hospital.  Patient/Caregiver Signature _______________________________ Date __________  Clinician Signature _______________________________________ Date __________  Please bring this form and your medication list with you to all your follow-up doctor's appointments.   Information on my medicine - ELIQUIS (apixaban)  This medication education was reviewed with me or my healthcare representative as part of my discharge preparation.  The  pharmacist that spoke with me during my hospital stay was:    Why was Eliquis prescribed for you? Eliquis was prescribed for you to reduce the risk of a blood clot forming that can cause a stroke if you have a medical condition called atrial fibrillation (a type of irregular heartbeat).  What do You need to know about Eliquis ? Take your Eliquis TWICE DAILY - one tablet in the morning and one tablet in the evening with or without food. If you have difficulty swallowing the tablet whole please discuss with your pharmacist how to take the medication safely.  Take Eliquis exactly as prescribed by your doctor and DO NOT stop taking Eliquis without talking to the doctor who prescribed the medication.  Stopping may increase your risk of developing a stroke.  Refill your prescription before you run out.  After discharge, you should have regular check-up appointments with your healthcare provider that is prescribing your Eliquis.  In the future your dose may need to be changed if your kidney function or weight changes by a significant amount or as you get older.  What do you do if you miss a dose? If you miss a dose, take it as soon as you remember on the same day and resume taking twice daily.  Do not take more than one dose of ELIQUIS at the same time to make up a missed dose.  Important Safety  Information A possible side effect of Eliquis is bleeding. You should call your healthcare provider right away if you experience any of the following: ? Bleeding from an injury or your nose that does not stop. ? Unusual colored urine (red or dark brown) or unusual colored stools (red or black). ? Unusual bruising for unknown reasons. ? A serious fall or if you hit your head (even if there is no bleeding).  Some medicines may interact with Eliquis and might increase your risk of bleeding or clotting while on Eliquis. To help avoid this, consult your healthcare provider or pharmacist prior to using any  new prescription or non-prescription medications, including herbals, vitamins, non-steroidal anti-inflammatory drugs (NSAIDs) and supplements.  This website has more information on Eliquis (apixaban): http://[email or web address].com/eliquis/home

## 2021-04-16 NOTE — Discharge Summary (Addendum)
Physician Discharge Summary  Patient ID: Loretha Ure MRN: [id number] DOB/AGE: [birth year] 70 y.o.  Admit date: 04/03/2021 Discharge date: 04/16/2021  Discharge Diagnoses:  Principal Problem:   Intensive care (ICU) myopathy Active Problems:   Protein-calorie malnutrition, severe   Physical debility   Left foot drop   PAF (paroxysmal atrial fibrillation) (HCC)   Dysphagia   Anxiety state   Poor dentition   Acute blood loss anemia   Chronic combined systolic and diastolic congestive heart failure (HCC)   Discharged Condition: stable   Significant Diagnostic Studies: DG Orthopantogram  Result Date: 04/11/2021 CLINICAL DATA:  Poor dentition, broken and cracked teeth EXAM: ORTHOPANTOGRAM/PANORAMIC COMPARISON:  None. FINDINGS: Panorex view of the mandible was performed. There are no acute or destructive bony lesions. There is extremely poor dentition, with fractured and erosive changes of the remaining teeth diffusely. Visualized sinuses are clear. IMPRESSION: 1. Extremely poor dentition. No acute or destructive bony abnormality. Electronically Signed   By: Randa Ngo M.D.   On: 04/11/2021 15:15   DG Cystogram  Result Date: 03/27/2021 CLINICAL DATA:  Post bladder repair EXAM: CYSTOGRAM TECHNIQUE: The bladder was filled with 250 mL Cysto-conray 30% by drip infusion utilizing indwelling Foley catheter. Serial spot images were obtained during bladder filling. FLUOROSCOPY TIME:  Fluoroscopy Time:  18 seconds Radiation Exposure Index (if provided by the fluoroscopic device): 1.9 mGy COMPARISON:  None. FINDINGS: Filling of the bladder demonstrates no contour abnormality or filling defect apart from Foley balloon. There is no evidence of contrast extravasation. There is no reflux into the ureters. IMPRESSION: No evidence of leak. Electronically Signed   By: Macy Mis M.D.   On: 03/27/2021 11:58   DG Swallowing Func-Speech Pathology  Result Date: 04/05/2021 Objective Swallowing  Evaluation: Type of Study: MBS-Modified Barium Swallow Study  Patient Details Name: Lanna Labella MRN: [id number] Date of Birth: November 14, 1951 Today's Date: 04/05/2021 Time: SLP Start Time (ACUTE ONLY): 1215 -SLP Stop Time (ACUTE ONLY): 1315 SLP Time Calculation (min) (ACUTE ONLY): 60 min Past Medical History: Past Medical History: Diagnosis Date  Hypertension   Thyroid disease  Past Surgical History: Past Surgical History: Procedure Laterality Date  CARDIAC SURGERY    COLECTOMY WITH COLOSTOMY CREATION/HARTMANN PROCEDURE Left 03/01/2021  Procedure: COLECTOMY WITH COLOSTOMY CREATION/HARTMANN PROCEDURE;  Surgeon: Ronny Bacon, MD;  Location: [facility] ORS;  Service: General;  Laterality: Left;  COLOSTOMY REVISION N/A 03/09/2021  Procedure: COLOSTOMY REVISION;  Surgeon: Ronny Bacon, MD;  Location: [facility] ORS;  Service: General;  Laterality: N/A;  TRACHEOSTOMY TUBE PLACEMENT N/A 03/08/2021  Procedure: TRACHEOSTOMY;  Surgeon: Clyde Canterbury, MD;  Location: [facility] ORS;  Service: ENT;  Laterality: N/A; HPI: Pt is a 70 y.o. female who admitted on 02/21/2021 w/ Diverticulitis with Colovesical fistula and UTI, now 71 Days Post-Op s/p Hartman's Procedure for complicated diverticulitis with gangrenous changes and perforation with feculent peritonitis and 4 days s/p colostomy revision.  Pt remained on vent failing wean; Tracheostomy placed on 03/08/2021.  Pt is now on Trach Collar O2 support weaning from full vent support on 03/15/2021.  Ongoing Wound Care.  MBSS completed on 03/22/2021.  Subjective: pt much more awake, mouthing/talking w/ air leak. Confusion noted but able to follow basic instructions w/ cues. PMV placed w/out difficulty. Assessment / Plan / Recommendation CHL IP CLINICAL IMPRESSIONS 04/05/2021 Clinical Impression PMV in place during MBS. Patient presents with a mild oropharyngeal dysphagia with oral phase impacted significantly by patient's missing and poor condition of dentition. (She reported that her teeth have been  decaying since heart  bypass surgeries and she does intend to get dental work, likely dentures/partials). Oral phase marked by decreased mastication of regular solids and mild delay in anterior to posterior transit with puree solids and regular solids. Pharyngeal phase of swallow consisted of swallow initiation delay to level of vallecular sinus with thin liquids via cup sips and pyriform sinus with thin liquids via straw sips. One instance of flash penetration observed when patient drinking thin liquid barium with barium tablet but no intances of aspiration observed. Trace amount of vallecular residuals present after initial swallow with all consistencies however these cleared with subsequent swallows. SLP observed patient with cup sip of thin liquids with PMV off and only noticeable difference was swallow initiation delay occured at level of pyriform sinus instead of vallecular sinus. SLP Visit Diagnosis Dysphagia, oropharyngeal phase (R13.12) Attention and concentration deficit following -- Frontal lobe and executive function deficit following -- Impact on safety and function Mild aspiration risk   CHL IP TREATMENT RECOMMENDATION 03/22/2021 Treatment Recommendations Therapy as outlined in treatment plan below   Prognosis 03/22/2021 Prognosis for Safe Diet Advancement Fair Barriers to Reach Goals Cognitive deficits;Severity of deficits;Time post onset Barriers/Prognosis Comment Tracheostomy; PMV wear CHL IP DIET RECOMMENDATION 04/05/2021 SLP Diet Recommendations Dysphagia 2 (Fine chop) solids;Thin liquid Liquid Administration via Cup;Straw Medication Administration Whole meds with puree Compensations Minimize environmental distractions;Slow rate;Small sips/bites Postural Changes Seated upright at 90 degrees   CHL IP OTHER RECOMMENDATIONS 04/05/2021 Recommended Consults -- Oral Care Recommendations Oral care BID Other Recommendations --   CHL IP FOLLOW UP RECOMMENDATIONS 03/28/2021 Follow up Recommendations Inpatient  Rehab   CHL IP FREQUENCY AND DURATION 03/22/2021 Speech Therapy Frequency (ACUTE ONLY) min 3x week Treatment Duration 2 weeks      CHL IP ORAL PHASE 04/05/2021 Oral Phase Impaired Oral - Pudding Teaspoon -- Oral - Pudding Cup -- Oral - Honey Teaspoon -- Oral - Honey Cup -- Oral - Nectar Teaspoon -- Oral - Nectar Cup -- Oral - Nectar Straw -- Oral - Thin Teaspoon -- Oral - Thin Cup WFL Oral - Thin Straw WFL Oral - Puree Reduced posterior propulsion Oral - Mech Soft -- Oral - Regular Impaired mastication;Reduced posterior propulsion Oral - Multi-Consistency -- Oral - Pill Decreased bolus cohesion Oral Phase - Comment --  CHL IP PHARYNGEAL PHASE 04/05/2021 Pharyngeal Phase Impaired Pharyngeal- Pudding Teaspoon -- Pharyngeal -- Pharyngeal- Pudding Cup -- Pharyngeal -- Pharyngeal- Honey Teaspoon -- Pharyngeal -- Pharyngeal- Honey Cup -- Pharyngeal -- Pharyngeal- Nectar Teaspoon -- Pharyngeal -- Pharyngeal- Nectar Cup -- Pharyngeal -- Pharyngeal- Nectar Straw -- Pharyngeal -- Pharyngeal- Thin Teaspoon -- Pharyngeal -- Pharyngeal- Thin Cup Delayed swallow initiation-vallecula;Pharyngeal residue - valleculae Pharyngeal Material enters airway, remains ABOVE vocal cords then ejected out Pharyngeal- Thin Straw Delayed swallow initiation-pyriform sinuses;Delayed swallow initiation-vallecula;Pharyngeal residue - valleculae Pharyngeal Material does not enter airway Pharyngeal- Puree Pharyngeal residue - valleculae Pharyngeal -- Pharyngeal- Mechanical Soft -- Pharyngeal -- Pharyngeal- Regular Pharyngeal residue - valleculae Pharyngeal -- Pharyngeal- Multi-consistency -- Pharyngeal -- Pharyngeal- Pill -- Pharyngeal -- Pharyngeal Comment --  CHL IP CERVICAL ESOPHAGEAL PHASE 04/05/2021 Cervical Esophageal Phase WFL Pudding Teaspoon -- Pudding Cup -- Honey Teaspoon -- Honey Cup -- Nectar Teaspoon -- Nectar Cup -- Nectar Straw -- Thin Teaspoon -- Thin Cup -- Thin Straw -- Puree -- Mechanical Soft -- Regular -- Multi-consistency -- Pill --  Cervical Esophageal Comment -- Sonia Baller, MA, CCC-SLP Speech Therapy               Labs:  Basic  Metabolic Panel: Recent Labs  Lab 04/13/21 0600 04/13/21 0932 04/16/21 0721  NA 140  --  138  K 3.8  --  3.7  CL 107  --  104  CO2 28  --  26  GLUCOSE 84  --  145*  BUN 15  --  15  CREATININE 1.07*  --  1.37*  CALCIUM 8.8*  --  8.7*  MG  --  2.0  --     CBC: Recent Labs  Lab 04/13/21 0600 04/16/21 0721  WBC 7.3 9.3  NEUTROABS 4.3 6.6  HGB 7.9* 8.3*  HCT 24.6* 25.5*  MCV 96.9 97.3  PLT 239 258    CBG: Recent Labs  Lab 04/15/21 0618 04/15/21 1202 04/15/21 1637 04/15/21 2104 04/16/21 0604  GLUCAP 78 89 110* 114* 89    Brief HPI:   Avarie Tavano is a 70 y.o. female with history of with history of HTN, colovesicular fistula, T11 compression fracture, diverticulitis, chronic constipation who was admitted to [facility] on 02/21/2021 with severe abdominal pain concerning for acute diverticulitis and large stool burden as well as moderately progressive severe T11 compression fracture.  She was started on antibiotics treatment of UTI and diverticulitis.  GI recommended supportive care with augmentation of bowel program.  However she continued to have worsening with substantial localized wall thickening suspicious for progressive diverticulitis or regional colitis with progressive rise in WBC.  CT abdomen showed abscess with question of fistula formation.  She underwent CT-guided drainage of 100 cc fluid from LLQ abscess but continued to have ongoing pain with upward trend in white count.  She was taken to the OR on 03/24 for sigmoid colectomy with end colostomy and Hartman's procedure by Dr. Christian Mate as well as cystorrhaphy by Dr. Jeb Levering.  Postop course significant for septic shock, MDS, NSTEMI, delirium as well as development of A. fib.  She was started on amiodarone for episodes of wide-complex tachycardia with PAF and transition to Eliquis.  She had difficulty with vent wean  requiring tracheostomy and was extubated to ATC by 04/09 and started on dysphagia 1 nectar liquids.  Foley was placed due to urinary retention and failed voiding trial.  Mentation was improving however she was noted to have moderate severe depression therefore Remeron was added to help with mood and appetite.  Respiratory status stable with PMSV but she continued to have weakness with decrease in activity tolerance as well as anxiety with activity.  CIR was recommended due to functional decline.   Hospital Course: Kanyon Bunn was admitted to rehab 04/03/2021 for inpatient therapies to consist of PT, ST and OT at least three hours five days a week. Past admission physiatrist, therapy team and rehab RN have worked together to provide customized collaborative inpatient rehab. Blood pressures were monitored on TID basis and has been stable.  Heart rate has been controlled on amiodarone and Toprol.  Respiratory status was stable and she was started on plugging trials and decannulated without problems in 05/03.  Electrolyte abnormalities have been monitored.  Hypokalemia was supplemented with improvement.  Speech therapy has been following for dysphagia treatment and she was advanced to regular diet thin liquids.  CBG checks showed stress-induced hyperglycemia is resolving.   Serial check of BMET showed electrolytes to be within normal limits and rise in serum creatinine to 1.37.  Recommend follow-up be met in 1 to 2 weeks to monitor renal status.  Serial check of CBC shows acute blood loss anemia is relatively stable.  Abdominal wound was  treated with wound VAC.  She does continue to have drainage from wound and wound VAC was ordered for follow-up wound care at home however patient and daughter expressed preference to go home with wet-to-dry dressings as wound was granulating in well.  Foley removed on day past admission and voiding was monitored with PVR checks.  She has had improvement in voiding function with  resolution of retention and is currently voiding without difficulty.  She did report dental pain and has history of poor dentition therefore orthopantogram was ordered to rule out abscess or infection as cause.  This was negative for infection therefore patient was started on Peridex for oral care and advised to follow-up with primary dentist after discharge.  She has had some improvement in her left foot drop.  PRAFO was used at nights and AFO was ordered to help with gait quality.  Team has offered ego support to help manage high levels of anxiety.  She has made progress and is currently at supervision level for safety.  She will continue receive follow-up home health PT, OT and RN by [facility] after discharge.   Rehab course: During patient's stay in rehab weekly team conferences were held to monitor patient's progress, set goals and discuss barriers to discharge. At admission, patient required mod assist with mobility and max assist with ADL tasks.  She exhibited dysphagia requiring modified diet and was tolerating Passy-Muir speaking valve without difficulty. She  has had improvement in activity tolerance, balance, postural control as well as ability to compensate for deficits. She is able to complete ADL tasks with supervision. She requires supervision for transfers and to ambulate 200 feet x 2 with rolling walker.  Family education was completed with daughter.  Discharge disposition: 01-Home or Self Care  Diet: Regular  Special Instructions: 1. Recommend repeat BMET in 1-2 weeks to monitor renal status.  2. Continue wet to dry dressing changes daily and follow up with surgery for monitoring.    Discharge Instructions     Ambulatory referral to Physical Medicine Rehab   Complete by: As directed    3-4 week follow up      Allergies as of 04/16/2021       Reactions   Baclofen    Other reaction(s): Headache   Sertraline    Other reaction(s): Other (See Comments), Other  (See Comments) Muscle aches Muscle aches   Sulfa Antibiotics    Other reaction(s): Other (See Comments), Other (See Comments) Pt was told not to take it due to colitis Pt was told not to take it due to colitis   Ibuprofen Nausea And Vomiting   Trazodone And Nefazodone         Medication List     STOP taking these medications    alendronate 70 MG tablet Commonly known as: FOSAMAX   docusate 50 MG/5ML liquid Commonly known as: COLACE   feeding supplement (NEPRO CARB STEADY) Liqd   insulin aspart 100 UNIT/ML injection Commonly known as: novoLOG   mouth rinse Liqd solution   ondansetron 4 MG tablet Commonly known as: ZOFRAN   polyethylene glycol 17 g packet Commonly known as: MIRALAX / GLYCOLAX       TAKE these medications    acetaminophen 325 MG tablet Commonly known as: TYLENOL Take 1-2 tablets (325-650 mg total) by mouth every 4 (four) hours as needed for mild pain.   ALPRAZolam 0.25 MG tablet Commonly known as: XANAX Take 1 tablet (0.25 mg total) by mouth 2 (two) times  daily as needed for anxiety or sleep.   amiodarone 200 MG tablet Commonly known as: PACERONE Take 1 tablet (200 mg total) by mouth 2 (two) times daily.   apixaban 5 MG Tabs tablet Commonly known as: ELIQUIS Take 1 tablet (5 mg total) by mouth 2 (two) times daily.   ascorbic acid 500 MG tablet Commonly known as: VITAMIN C Take 1 tablet (500 mg total) by mouth 2 (two) times daily.   atorvastatin 20 MG tablet Commonly known as: LIPITOR Take 1 tablet (20 mg total) by mouth daily.   chlorhexidine gluconate (MEDLINE KIT) 0.12 % solution Commonly known as: PERIDEX 15 mLs by Mouth Rinse route 2 (two) times daily.   dronabinol 2.5 MG capsule--Rx # 15 pills Commonly known as: MARINOL Take 1 capsule (2.5 mg total) by mouth daily before lunch. What changed: when to take this   fluticasone 50 MCG/ACT nasal spray Commonly known as: FLONASE Place 2 sprays into both nostrils daily.    levothyroxine 75 MCG tablet Commonly known as: SYNTHROID Take 1 tablet (75 mcg total) by mouth daily at 6 (six) AM.   lip balm Oint Apply 1 application topically as needed for lip care.   metoprolol succinate 25 MG 24 hr tablet Commonly known as: TOPROL-XL Take 0.5 tablets (12.5 mg total) by mouth daily.   mirtazapine 15 MG tablet Commonly known as: REMERON Take 1 tablet (15 mg total) by mouth at bedtime.   multivitamin with minerals Tabs tablet Take 1 tablet by mouth daily.   oxyCODONE 5 MG immediate release tablet--Rx #28 pills Commonly known as: Oxy IR/ROXICODONE Take 1 tablet (5 mg total) by mouth every 6 (six) hours as needed for moderate pain or severe pain. What changed: reasons to take this   pantoprazole 40 MG tablet Commonly known as: PROTONIX Take 1 tablet (40 mg total) by mouth daily.   tamsulosin 0.4 MG Caps capsule Commonly known as: FLOMAX Take 1 capsule (0.4 mg total) by mouth daily after supper.        Follow-up Information     Jamse Arn, MD Follow up.   Specialty: Physical Medicine and Rehabilitation Why: Office will call you with follow up appointment Contact information: [address] [place] [address] [phone]         Ronny Bacon, MD Follow up.   Specialty: General Surgery Why: for post op appoinment Contact information: [address] [place] [address] [phone]         Billey Co, MD. Call.   Specialty: Urology Why: for follow up appt Contact information: [place] [address] [phone]         Corey Skains, MD Follow up.   Specialty: Cardiology Why: for follow up on cardiac issues. Contact information: [address] [place] [facility] [place] [address] [phone]         Gladstone Lighter, MD. Call on 04/16/2021.   Specialty: Internal Medicine Why: for post hospital follow up in next 1-2  weeks Contact information: [place] [address] [phone]                 Signed: Bary Leriche 04/19/2021, 6:27 PM Patient was seen, face-face, and physical exam performed by me on day of discharge, greater than 30 minutes of total time spent.. Please see progress note from day of discharge as well.  Delice Lesch, MD, ABPMR

## 2021-04-16 NOTE — Progress Notes (Signed)
Patient ID: Desiree Madden, female   DOB: 1951-11-12, 70 y.o.   MRN: [id number]  Informed by pam-PA wound vac was discharged by Dr Dagoberto Ligas over the weekend due to pt and daughter's request not to go home with. Have contacted [facility] to inform of no need to vac and informed [facility] of dressing changes. Pt feels prepared for discharge today.

## 2021-04-16 NOTE — Progress Notes (Signed)
[place] PHYSICAL MEDICINE & REHABILITATION PROGRESS NOTE  Subjective/Complaints: Patient seen sitting up in her chair this AM.  She states she did not sleep well overnight due to being anxious about discharge.  She has questions regarding wound changes, colostomy, stoma, etc. She states her family has several other questions, but she cannot recall what they are.   ROS: Denies CP, SOB, N/V/D  Objective: Vital Signs: Blood pressure 111/68, pulse 75, temperature (!) 97.5 F (36.4 C), temperature source Oral, resp. rate 17, weight 57.9 kg, SpO2 97 %. No results found. Recent Labs    04/16/21 0721  WBC 9.3  HGB 8.3*  HCT 25.5*  PLT 258   Recent Labs    04/16/21 0721  NA 138  K 3.7  CL 104  CO2 26  GLUCOSE 145*  BUN 15  CREATININE 1.37*  CALCIUM 8.7*    Intake/Output Summary (Last 24 hours) at 04/16/2021 1321 Last data filed at 04/16/2021 0715 Gross per 24 hour  Intake 360 ml  Output --  Net 360 ml        Physical Exam: BP 111/68   Pulse 75   Temp (!) 97.5 F (36.4 C) (Oral)   Resp 17   Wt 57.9 kg   SpO2 97%   BMI 21.90 kg/m   Constitutional: No distress . Vital signs reviewed. HENT: Normocephalic.  Atraumatic. Eyes: EOMI. No discharge. Cardiovascular: No JVD.  RRR. Respiratory: Normal effort.  No stridor.  Bilateral clear to auscultation. GI: Non-distended.  BS +. +Colostomy Abdominal wound with good granulation tissue Skin: Warm and dry.  Intact. Psych: Anxious. Normal behavior. Musc: No edema in extremities.  No tenderness in extremities. Neuro: Alert \\Motor : Grossly 4+/5 throughout, except for left lower extremity dorsiflexion 0/5, unchanged  Assessment/Plan: 1. Functional deficits which require 3+ hours per day of interdisciplinary therapy in a comprehensive inpatient rehab setting.  Physiatrist is providing close team supervision and 24 hour management of active medical problems listed below.  Physiatrist and rehab team continue to assess  barriers to discharge/monitor patient progress toward functional and medical goals   Care Tool:  Bathing    Body parts bathed by patient: Right arm,Left arm,Chest,Right upper leg,Left upper leg,Face,Front perineal area,Buttocks,Right lower leg,Left lower leg   Body parts bathed by helper: Right lower leg,Left lower leg Body parts n/a: Abdomen   Bathing assist Assist Level: Supervision/Verbal cueing     Upper Body Dressing/Undressing Upper body dressing   What is the patient wearing?: Bra,Dress    Upper body assist Assist Level: Set up assist    Lower Body Dressing/Undressing Lower body dressing      What is the patient wearing?: Pants     Lower body assist Assist for lower body dressing: Supervision/Verbal cueing     Toileting Toileting    Toileting assist Assist for toileting: Supervision/Verbal cueing     Transfers Chair/bed transfer  Transfers assist     Chair/bed transfer assist level: Supervision/Verbal cueing Chair/bed transfer assistive device: Programmer, multimedia   Ambulation assist      Assist level: Supervision/Verbal cueing Assistive device: Walker-rolling Max distance: 200'   Walk 10 feet activity   Assist     Assist level: Supervision/Verbal cueing Assistive device: Walker-rolling   Walk 50 feet activity   Assist Walk 50 feet with 2 turns activity did not occur: Safety/medical concerns  Assist level: Supervision/Verbal cueing Assistive device: Walker-rolling    Walk 150 feet activity   Assist Walk 150 feet activity did not  occur: Safety/medical concerns  Assist level: Supervision/Verbal cueing Assistive device: Walker-rolling    Walk 10 feet on uneven surface  activity   Assist Walk 10 feet on uneven surfaces activity did not occur: Safety/medical concerns   Assist level: Contact Guard/Touching assist Assistive device: Aeronautical engineer Will patient use wheelchair at  discharge?: No   Wheelchair activity did not occur: N/A         Wheelchair 50 feet with 2 turns activity    Assist    Wheelchair 50 feet with 2 turns activity did not occur: N/A       Wheelchair 150 feet activity     Assist  Wheelchair 150 feet activity did not occur: N/A        Medical Problem List and Plan: 1.  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   DC today  Will see patient for hospital follow up in 1 month post-discharge 2.  Antithrombotics: Continue Eliquis             -antiplatelet therapy: N/A 3. Pain Management: Decrease Oxcodone to q12H prn.   Controlled with meds on 5/9 4. Mood: Team support, encouraged positivity             -antipsychotic agents: N/A 5. Neuropsych: This patient is cable of making decisions on her own behalf. 6. Skin/Wound Care: Routine pressure relief measures.   Wet/Dry dressing - Patient did not want VAC at discharge 7. Fluids/Electrolytes/Nutrition: Monitor I/Os.             Continue Vitamin C/Multivitamin 8. Perforated diverticulitis s/p sigmoid colectomy w/end colostomy: WOC following to education.   +Ostomy  Senna DC'd on 5/6 9. NSTEMI/ICM/Chronic combined CHF: Compensated. On low dose Toprol XL Filed Weights   04/14/21 0500 04/15/21 0419 04/16/21 0450  Weight: 55.4 kg 56.5 kg 57.9 kg   Inconsistent recordings 10. SVT/PAF: Monitor HR tid--continue amiodarone, toprol and Eliquis.  Controlled on 5/6, continue to monitor  Monitor increase activity 11. VDRF s/p trach: Monitor respiratory status  Continue cuffed #6 shiley  Decannulated without issue on 5/3  5/7- healing well- con't wound care 12. Hypokalemia/Hypomagnesemia: Due to malnutrition/critical illness             Monitor and supplement as indicated.             Continue supplements to help with nutritional state.             Continue Marinol-->monitor for SE.   Potassium  3.8 on 5/6  Magnesium 2.0 on 4/23, 2.0 also 5/6- con't regimen 13. Situational depression: Continue Remeron for smood stabilization              --Xanax prn to manage anxiety.  14. Cystopexy/Urinary retention:   Added flomax             Improving 15. Chronic insomnia: Continue Xanax prn. Melatonin ineffective.              --Remeron  16. Left foot drop   PRAFO  AFO 17.  Dysphagia  Regular thins now with med modifications  Monitor p.o. intake  Continue to advance diet as tolerated 18.  CKD stage II/III  Creatinine 1.37 on 5/6  Continue to monitor 19. Poor dentition with broken teeth  Xray showing poor dentition, but no other abnormalities 20.  Acute blood loss anemia  Hemoglobin 8.3 on 5/9  Continue to monitor  >  30 minutes spent in total in discharge planning between myself and PA regarding aforementioned, as well discussion regarding DME equipment, follow-up appointments, follow-up therapies, discharge medications, discharge recommendations, answering questions.  Please see discharge summary as well.  LOS: 13 days A FACE TO FACE EVALUATION WAS PERFORMED  Cordaryl Decelles Lorie Phenix 04/16/2021, 1:21 PM

## 2021-04-17 LAB — GLUCOSE, CAPILLARY
Glucose-Capillary: 78 mg/dL (ref 70–99)
Glucose-Capillary: 89 mg/dL (ref 70–99)
Glucose-Capillary: 110 mg/dL — ABNORMAL HIGH (ref 70–99)
Glucose-Capillary: 114 mg/dL — ABNORMAL HIGH (ref 70–99)
Glucose-Capillary: 89 mg/dL (ref 70–99)

## 2021-04-20 DIAGNOSIS — Z09 Encounter for follow-up examination after completed treatment for conditions other than malignant neoplasm: Secondary | ICD-10-CM | POA: Diagnosis not present

## 2021-04-20 DIAGNOSIS — R531 Weakness: Secondary | ICD-10-CM | POA: Diagnosis not present

## 2021-04-20 DIAGNOSIS — Z933 Colostomy status: Secondary | ICD-10-CM | POA: Diagnosis not present

## 2021-04-20 DIAGNOSIS — M7989 Other specified soft tissue disorders: Secondary | ICD-10-CM | POA: Diagnosis not present

## 2021-04-20 DIAGNOSIS — K5900 Constipation, unspecified: Secondary | ICD-10-CM | POA: Diagnosis not present

## 2021-04-21 DIAGNOSIS — N183 Chronic kidney disease, stage 3 unspecified: Secondary | ICD-10-CM | POA: Diagnosis not present

## 2021-04-21 DIAGNOSIS — I480 Paroxysmal atrial fibrillation: Secondary | ICD-10-CM | POA: Diagnosis not present

## 2021-04-21 DIAGNOSIS — I5042 Chronic combined systolic (congestive) and diastolic (congestive) heart failure: Secondary | ICD-10-CM | POA: Diagnosis not present

## 2021-04-21 DIAGNOSIS — I255 Ischemic cardiomyopathy: Secondary | ICD-10-CM | POA: Diagnosis not present

## 2021-04-21 DIAGNOSIS — J9621 Acute and chronic respiratory failure with hypoxia: Secondary | ICD-10-CM | POA: Diagnosis not present

## 2021-04-21 DIAGNOSIS — Z48815 Encounter for surgical aftercare following surgery on the digestive system: Secondary | ICD-10-CM | POA: Diagnosis not present

## 2021-04-21 DIAGNOSIS — I252 Old myocardial infarction: Secondary | ICD-10-CM | POA: Diagnosis not present

## 2021-04-21 DIAGNOSIS — I130 Hypertensive heart and chronic kidney disease with heart failure and stage 1 through stage 4 chronic kidney disease, or unspecified chronic kidney disease: Secondary | ICD-10-CM | POA: Diagnosis not present

## 2021-04-21 DIAGNOSIS — I471 Supraventricular tachycardia: Secondary | ICD-10-CM | POA: Diagnosis not present

## 2021-04-24 ENCOUNTER — Other Ambulatory Visit: Payer: Self-pay

## 2021-04-24 ENCOUNTER — Telehealth (INDEPENDENT_AMBULATORY_CARE_PROVIDER_SITE_OTHER): Payer: Medicare HMO | Admitting: Urology

## 2021-04-24 DIAGNOSIS — N322 Vesical fistula, not elsewhere classified: Secondary | ICD-10-CM | POA: Diagnosis not present

## 2021-04-24 NOTE — Progress Notes (Signed)
Virtual Visit via Telephone Note  I connected with Desiree Madden on 04/24/21 at  9:45 AM EDT by telephone and verified that I am speaking with the correct person using two identifiers.   Patient location: Home Provider location: [facility]   I discussed the limitations, risks, security and privacy concerns of performing an evaluation and management service by telephone and the availability of in person appointments. We discussed the impact of the COVID-19 pandemic on the healthcare system, and the importance of social distancing and reducing patient and provider exposure. I also discussed with the patient that there may be a patient responsible charge related to this service. The patient expressed understanding and agreed to proceed.  Reason for visit: Follow-up bladder repair and urinary retention  History of Present Illness: 70 year old female who presented in March 2022 with perforated diverticulitis and a colovesical fistula with pelvic abscess and was taken to the OR by Dr. Christian Mate in general surgery for a washout and bowel resection.  At that time she was found to have a 1 cm fistula at the dome of the bladder, and I was consulted to repair this.  This was repaired in 2 layers, and was watertight Intra-Op.  She underwent a follow-up cystogram on 4/19 that showed no evidence of leak.  Her catheter was removed but she had multiple episodes of retention likely secondary to her overall debilitated state and prolonged ICU stay.  Her catheter has since been removed in rehab and she has been voiding spontaneously with low PVRs.  She denies any urinary complaints today.  She can stop Flomax.  Return precautions discussed, can follow-up with urology as needed.   I discussed the assessment and treatment plan with the patient. The patient was provided an opportunity to ask questions and all were answered. The patient agreed with the plan and demonstrated an understanding of the  instructions.   The patient was advised to call back or seek an in-person evaluation if the symptoms worsen or if the condition fails to improve as anticipated.  I provided 12 minutes of non-face-to-face time during this encounter.   Billey Co, MD

## 2021-04-25 DIAGNOSIS — I480 Paroxysmal atrial fibrillation: Secondary | ICD-10-CM | POA: Diagnosis not present

## 2021-04-25 DIAGNOSIS — N183 Chronic kidney disease, stage 3 unspecified: Secondary | ICD-10-CM | POA: Diagnosis not present

## 2021-04-25 DIAGNOSIS — J9621 Acute and chronic respiratory failure with hypoxia: Secondary | ICD-10-CM | POA: Diagnosis not present

## 2021-04-25 DIAGNOSIS — Z48815 Encounter for surgical aftercare following surgery on the digestive system: Secondary | ICD-10-CM | POA: Diagnosis not present

## 2021-04-25 DIAGNOSIS — I252 Old myocardial infarction: Secondary | ICD-10-CM | POA: Diagnosis not present

## 2021-04-25 DIAGNOSIS — I471 Supraventricular tachycardia: Secondary | ICD-10-CM | POA: Diagnosis not present

## 2021-04-25 DIAGNOSIS — I5042 Chronic combined systolic (congestive) and diastolic (congestive) heart failure: Secondary | ICD-10-CM | POA: Diagnosis not present

## 2021-04-25 DIAGNOSIS — I130 Hypertensive heart and chronic kidney disease with heart failure and stage 1 through stage 4 chronic kidney disease, or unspecified chronic kidney disease: Secondary | ICD-10-CM | POA: Diagnosis not present

## 2021-04-25 DIAGNOSIS — I255 Ischemic cardiomyopathy: Secondary | ICD-10-CM | POA: Diagnosis not present

## 2021-04-26 ENCOUNTER — Encounter: Payer: Self-pay | Admitting: Surgery

## 2021-04-26 ENCOUNTER — Ambulatory Visit (INDEPENDENT_AMBULATORY_CARE_PROVIDER_SITE_OTHER): Payer: Medicare HMO | Admitting: Surgery

## 2021-04-26 ENCOUNTER — Other Ambulatory Visit: Payer: Self-pay

## 2021-04-26 VITALS — BP 116/68 | HR 74 | Temp 98.1°F | Ht 63.5 in | Wt 123.1 lb

## 2021-04-26 DIAGNOSIS — Z933 Colostomy status: Secondary | ICD-10-CM

## 2021-04-26 NOTE — Progress Notes (Signed)
[facility] POST-OP OFFICE VISIT  04/26/2021  HPI: Desiree Madden is a 70 y.o. female 50 days s/p Hartman's procedure, with eventual colostomy revision/maturation.  She had a prolonged hospitalization due to the degree of sepsis and remarkable respiratory compromise that required tracheostomy.  She is still in rehab currently, tracheostomy site is well-healed.  She is tolerating a diet well with regular bowel function.  She reports having some movements of mucousy stools per rectum.  No fevers or chills.  Making progress with rehab with further ambulation, gradually regaining some strength but remains easily fatigued.  She looks the best I have ever seen her.  She presents in a wheelchair with a left foot drop orthotic.  Vital signs: BP 116/68   Pulse 74   Temp 98.1 F (36.7 C) (Oral)   Ht 5' 3.5" (1.613 m)   Wt 123 lb 1.6 oz (55.8 kg)   SpO2 100%   BMI 21.46 kg/m    Physical Exam: Constitutional: She looks well, she is improved remarkably. Abdomen: Soft and nontender.  Excellent patent functioning colostomy left upper quadrant. Skin: Midline wound is nearly closed with a minimal amount of open red granulation tissue, I evaluated with silver nitrate stick for minimal hypergranulation tissue.  There is no depth at all to her wound.  Assessment/Plan: This is a 70 y.o. female 52 days s/p Hartman's procedure for severe acute diverticulitis with gangrenous changes and stenosis.  Her recovery is remarkable.  Patient Active Problem List   Diagnosis Date Noted  . Chronic combined systolic and diastolic congestive heart failure ([place])   . Acute blood loss anemia   . Poor dentition   . Anxiety state   . PAF (paroxysmal atrial fibrillation) ([place])   . Dysphagia   . Physical debility 04/03/2021  . Intensive care (ICU) myopathy 04/03/2021  . Left foot drop 04/03/2021  . Protein-calorie malnutrition, severe 03/13/2021  . Diverticulitis 02/21/2021  . Fistula, bladder 02/21/2021   . Ulcerative colitis ([place]) 02/21/2021  . Hyperlipidemia 02/21/2021  . Essential hypertension 02/21/2021  . Hypothyroidism 02/21/2021  . Abnormal ECG 02/21/2021  . CKD (chronic kidney disease), stage III ([place]) 02/21/2021  . Elevated lactic acid level 02/21/2021    -I encouraged her to continue her rehab, will not discuss colostomy reversal at this point in time, will encourage her to continue her to regain her strength and restore to her baseline condition.  We will be glad to help with anything else as needed.   Ronny Bacon M.D., FACS 04/26/2021, 11:56 AM

## 2021-04-26 NOTE — Patient Instructions (Signed)
If you have any concerns or questions, please feel free to call our office.    Colostomy Home Guide, Adult  Colostomy surgery is done to create an opening in the front of the abdomen for stool (feces) to leave the body through an ostomy (stoma). Part of the large intestine is attached to the stoma. A bag, also called a pouch, is fitted over the stoma. Stool and gas will collect in the bag. After surgery, you will need to empty and change your colostomy bag as needed. You will also need to care for your stoma. How to care for the stoma Your stoma should look pink, red, and moist, like the inside of your cheek. Soon after surgery, the stoma may be swollen, but this swelling will go away within 6 weeks. To care for the stoma:  Keep the skin around the stoma clean and dry.  Use a clean, soft washcloth to gently wash the stoma and the skin around it. Clean using a circular motion, and wipe away from the stoma opening, not toward it. ? Use warm water and only use cleansers recommended by your health care provider. ? Rinse the stoma area with plain water. ? Dry the area around the stoma well.  Use stoma powder or ointment on your skin only as told by your health care provider. Do not use any other powders, gels, wipes, or creams on the skin around the stoma.  Check the stoma area every day for signs of infection. Check for: ? New or worsening redness, swelling, or pain. ? New or increased fluid or blood. ? Pus or warmth.  Measure the stoma opening regularly and record the size. Watch for changes. (It is normal for the stoma to get smaller as swelling goes away.) Share this information with your health care provider. How to empty the colostomy bag Empty your bag at bedtime and whenever it is one-third to one-half full. Do not let the bag get more than half-full with stool or gas. The bag could leak if it gets too full. Some colostomy bags have a built-in gas release valve that releases gas  often throughout the day. Follow these basic steps: 1. Wash your hands with soap and water. 2. Sit far back on the toilet seat. 3. Put several pieces of toilet paper into the toilet water. This will prevent splashing as you empty stool into the toilet. 4. Remove the clip or the hook-and-loop fastener from the tail end of the bag. 5. Unroll the tail, then empty the stool into the toilet. 6. Clean the tail with toilet paper or a moist towelette. 7. Reroll the tail, and close it with the clip or the hook-and-loop fastener. 8. Wash your hands again.   How to change the colostomy bag Change your bag every 3-4 days or as often as told by your health care provider. Also change the bag if it is leaking or separating from the skin, or if your skin around the stoma looks or feels irritated. Irritated skin may be a sign that the bag is leaking. Always have colostomy supplies with you, and follow these basic steps: 1. Wash your hands with soap and water. Have paper towels or tissues nearby to clean any discharge. 2. Remove the old bag and skin barrier. Use your fingers or a warm cloth to gently push the skin away from the barrier. 3. Clean the stoma area with water or with mild soap and water, as directed. Use water to rinse away any  soap. 4. Dry the skin. You may use the cool setting on a hair dryer to do this. 5. Use a tracing pattern (template) to cut the skin barrier to the size needed. 6. If you are using a two-piece bag, attach the bag and the skin barrier to each other. Add the barrier ring, if you use one. 7. If directed, apply stoma powder or skin barrier gel to the skin. 8. Warm the skin barrier with your hands, or blow with a hair dryer for 5-10 seconds. 9. Remove the paper from the adhesive strip of the skin barrier. 10. Press the adhesive strip onto the skin around the stoma. 11. Gently rub the skin barrier onto the skin. This creates heat that helps the barrier to stick. 12. Apply stoma  tape to the edges of the skin barrier, if desired. 35. Wash your hands again. General recommendations  Avoid wearing tight clothes or having anything press directly on your stoma or bag. Change your clothing whenever it is soiled or damp.  You may shower or bathe with the bag on or off. Do not use harsh or oily soaps or lotions. Dry the skin and bag after bathing.  Store all supplies in a cool, dry place. Do not leave supplies in extreme heat because some parts can melt or not stick as well.  Whenever you leave home, take extra clothing and an extra skin barrier and bag with you.  If your bag gets wet, you can dry it with a hair dryer on the cool setting.  To prevent odor, you may put drops of ostomy deodorizer in the bag.  If recommended by your health care provider, put ostomy lubricant inside the bag. This helps stool to slide out of the bag more easily and completely. Contact a health care provider if:  You have new or worsening redness, swelling, or pain around your stoma.  You have new or increased fluid or blood coming from your stoma.  Your stoma feels warm to the touch.  You have pus coming from your stoma.  Your stoma extends in or out farther than normal.  You need to change your bag every day.  You have a fever. Get help right away if:  Your stool is bloody.  You have nausea or you vomit.  You have trouble breathing. Summary  Measure your stoma opening regularly and record the size. Watch for changes.  Empty your bag at bedtime and whenever it is one-third to one-half full. Do not let the bag get more than half-full with stool or gas.  Change your bag every 3-4 days or as often as told by your health care provider.  Whenever you leave home, take extra clothing and an extra skin barrier and bag with you. This information is not intended to replace advice given to you by your health care provider. Make sure you discuss any questions you have with your health  care provider. Document Revised: 07/27/2020 Document Reviewed: 07/27/2020 Elsevier Patient Education  2021 [employer].

## 2021-04-27 DIAGNOSIS — N183 Chronic kidney disease, stage 3 unspecified: Secondary | ICD-10-CM | POA: Diagnosis not present

## 2021-04-27 DIAGNOSIS — I5042 Chronic combined systolic (congestive) and diastolic (congestive) heart failure: Secondary | ICD-10-CM | POA: Diagnosis not present

## 2021-04-27 DIAGNOSIS — I471 Supraventricular tachycardia: Secondary | ICD-10-CM | POA: Diagnosis not present

## 2021-04-27 DIAGNOSIS — J9621 Acute and chronic respiratory failure with hypoxia: Secondary | ICD-10-CM | POA: Diagnosis not present

## 2021-04-27 DIAGNOSIS — I252 Old myocardial infarction: Secondary | ICD-10-CM | POA: Diagnosis not present

## 2021-04-27 DIAGNOSIS — I480 Paroxysmal atrial fibrillation: Secondary | ICD-10-CM | POA: Diagnosis not present

## 2021-04-27 DIAGNOSIS — I130 Hypertensive heart and chronic kidney disease with heart failure and stage 1 through stage 4 chronic kidney disease, or unspecified chronic kidney disease: Secondary | ICD-10-CM | POA: Diagnosis not present

## 2021-04-27 DIAGNOSIS — I255 Ischemic cardiomyopathy: Secondary | ICD-10-CM | POA: Diagnosis not present

## 2021-04-27 DIAGNOSIS — Z48815 Encounter for surgical aftercare following surgery on the digestive system: Secondary | ICD-10-CM | POA: Diagnosis not present

## 2021-04-30 DIAGNOSIS — I10 Essential (primary) hypertension: Secondary | ICD-10-CM | POA: Diagnosis not present

## 2021-04-30 DIAGNOSIS — I255 Ischemic cardiomyopathy: Secondary | ICD-10-CM | POA: Diagnosis not present

## 2021-04-30 DIAGNOSIS — Z48815 Encounter for surgical aftercare following surgery on the digestive system: Secondary | ICD-10-CM | POA: Diagnosis not present

## 2021-04-30 DIAGNOSIS — I471 Supraventricular tachycardia: Secondary | ICD-10-CM | POA: Diagnosis not present

## 2021-04-30 DIAGNOSIS — I480 Paroxysmal atrial fibrillation: Secondary | ICD-10-CM | POA: Diagnosis not present

## 2021-04-30 DIAGNOSIS — I5042 Chronic combined systolic (congestive) and diastolic (congestive) heart failure: Secondary | ICD-10-CM | POA: Diagnosis not present

## 2021-04-30 DIAGNOSIS — N183 Chronic kidney disease, stage 3 unspecified: Secondary | ICD-10-CM | POA: Diagnosis not present

## 2021-04-30 DIAGNOSIS — E782 Mixed hyperlipidemia: Secondary | ICD-10-CM | POA: Diagnosis not present

## 2021-04-30 DIAGNOSIS — I130 Hypertensive heart and chronic kidney disease with heart failure and stage 1 through stage 4 chronic kidney disease, or unspecified chronic kidney disease: Secondary | ICD-10-CM | POA: Diagnosis not present

## 2021-04-30 DIAGNOSIS — J9621 Acute and chronic respiratory failure with hypoxia: Secondary | ICD-10-CM | POA: Diagnosis not present

## 2021-04-30 DIAGNOSIS — I252 Old myocardial infarction: Secondary | ICD-10-CM | POA: Diagnosis not present

## 2021-04-30 DIAGNOSIS — I25810 Atherosclerosis of coronary artery bypass graft(s) without angina pectoris: Secondary | ICD-10-CM | POA: Diagnosis not present

## 2021-05-01 DIAGNOSIS — I130 Hypertensive heart and chronic kidney disease with heart failure and stage 1 through stage 4 chronic kidney disease, or unspecified chronic kidney disease: Secondary | ICD-10-CM | POA: Diagnosis not present

## 2021-05-01 DIAGNOSIS — N183 Chronic kidney disease, stage 3 unspecified: Secondary | ICD-10-CM | POA: Diagnosis not present

## 2021-05-01 DIAGNOSIS — I480 Paroxysmal atrial fibrillation: Secondary | ICD-10-CM | POA: Diagnosis not present

## 2021-05-01 DIAGNOSIS — I255 Ischemic cardiomyopathy: Secondary | ICD-10-CM | POA: Diagnosis not present

## 2021-05-01 DIAGNOSIS — J9621 Acute and chronic respiratory failure with hypoxia: Secondary | ICD-10-CM | POA: Diagnosis not present

## 2021-05-01 DIAGNOSIS — Z48815 Encounter for surgical aftercare following surgery on the digestive system: Secondary | ICD-10-CM | POA: Diagnosis not present

## 2021-05-01 DIAGNOSIS — I252 Old myocardial infarction: Secondary | ICD-10-CM | POA: Diagnosis not present

## 2021-05-01 DIAGNOSIS — I471 Supraventricular tachycardia: Secondary | ICD-10-CM | POA: Diagnosis not present

## 2021-05-01 DIAGNOSIS — I5042 Chronic combined systolic (congestive) and diastolic (congestive) heart failure: Secondary | ICD-10-CM | POA: Diagnosis not present

## 2021-05-02 DIAGNOSIS — I255 Ischemic cardiomyopathy: Secondary | ICD-10-CM | POA: Diagnosis not present

## 2021-05-02 DIAGNOSIS — Z48815 Encounter for surgical aftercare following surgery on the digestive system: Secondary | ICD-10-CM | POA: Diagnosis not present

## 2021-05-02 DIAGNOSIS — I480 Paroxysmal atrial fibrillation: Secondary | ICD-10-CM | POA: Diagnosis not present

## 2021-05-02 DIAGNOSIS — I5042 Chronic combined systolic (congestive) and diastolic (congestive) heart failure: Secondary | ICD-10-CM | POA: Diagnosis not present

## 2021-05-02 DIAGNOSIS — I130 Hypertensive heart and chronic kidney disease with heart failure and stage 1 through stage 4 chronic kidney disease, or unspecified chronic kidney disease: Secondary | ICD-10-CM | POA: Diagnosis not present

## 2021-05-02 DIAGNOSIS — N183 Chronic kidney disease, stage 3 unspecified: Secondary | ICD-10-CM | POA: Diagnosis not present

## 2021-05-02 DIAGNOSIS — I471 Supraventricular tachycardia: Secondary | ICD-10-CM | POA: Diagnosis not present

## 2021-05-02 DIAGNOSIS — J9621 Acute and chronic respiratory failure with hypoxia: Secondary | ICD-10-CM | POA: Diagnosis not present

## 2021-05-02 DIAGNOSIS — I252 Old myocardial infarction: Secondary | ICD-10-CM | POA: Diagnosis not present

## 2021-05-08 ENCOUNTER — Encounter: Payer: Medicare HMO | Attending: Registered Nurse | Admitting: Registered Nurse

## 2021-05-08 ENCOUNTER — Encounter: Payer: Self-pay | Admitting: Registered Nurse

## 2021-05-08 ENCOUNTER — Other Ambulatory Visit: Payer: Self-pay

## 2021-05-08 VITALS — BP 135/73 | HR 66 | Temp 98.8°F | Ht 63.5 in | Wt 126.4 lb

## 2021-05-08 DIAGNOSIS — R5381 Other malaise: Secondary | ICD-10-CM | POA: Insufficient documentation

## 2021-05-08 DIAGNOSIS — G7281 Critical illness myopathy: Secondary | ICD-10-CM | POA: Insufficient documentation

## 2021-05-08 DIAGNOSIS — I5042 Chronic combined systolic (congestive) and diastolic (congestive) heart failure: Secondary | ICD-10-CM

## 2021-05-08 DIAGNOSIS — I480 Paroxysmal atrial fibrillation: Secondary | ICD-10-CM | POA: Insufficient documentation

## 2021-05-08 NOTE — Progress Notes (Signed)
Subjective:    Patient ID: Desiree Madden, female    DOB: July 23, 1951, 70 y.o.   MRN: [id number]  HPI: Desiree Madden is a 70 y.o. female who is here for [place] appointment of her Intensive Care Myopathy, Paroxysmal Atrial Fibrillation, Chronic Combined Systolic and Diastolic Congestive Heart Failure and Physical Debility. She presented to [facility] with complaints of abdominal pain . GI was consulted.  [name] had long complicated hospital stay, see discharge summaries for details.  CT Abdomen and Pelvis:  IMPRESSION: 1. Advanced colonic diverticulosis with pericolonic fat stranding about the proximal sigmoid colon, in the region of multiple colonic diverticula, suspicious for acute diverticulitis. This fat stranding extends to the adjacent urinary bladder. 2. Fluid level in the urinary bladder. This may be due to Foley catheter/instrumentation, or fistula as provided history. Pericolonic edema extends to the anterior aspect of the bladder. 3. Large volume of colonic stool from the splenic flexure distally, suggesting constipation. There is wall thickening in the mid sigmoid colon that is nonspecific. Recommend up-to-date colonoscopy to exclude the possibility of colonic neoplasm. 4. Chronic but progressive moderately severe T11 compression fracture. Increase vertebral body height loss since 11/23/2019 thoracic spine CT, particularly anteriorly.  Aortic Atherosclerosis (ICD10-I70.0).  DG Abdomen:  IMPRESSION: Large diffuse colonic stool volume, suggesting constipation. Nonobstructive bowel gas pattern. No evidence of pneumoperitoneum.  On 03/01/2021 she underwent COLECTOMY WITH COLOSTOMY CREATION/HARTMANN PROCEDURE, via Dr Henderson [place] and Dr Christian Mate.   On 03/08/2021 she underwent TRACHEOSTOMYby Dr Richardson Landry.   [name] was admitted to inpatient rehabilitation on 04/03/2021 and discharged home on 04/16/2021. She is receiving [place] with [place].  She  states at home she is walking with walker, she arrive in wheelchair.  She sates she has pain in her neck and mid- lower back pain. She rates her pain 5.    Pain Inventory Average Pain 5 Pain Right Now 5 My pain is intermittent and aching  LOCATION OF PAIN  Lower back pain  BOWEL Number of stools per week: 1-3 day colostomy bag changed Oral laxative use No  Type of laxative Ducolax Enema or suppository use No  History of colostomy Yes  Incontinent colostomy bag  BLADDER Normal In and out cath, frequency N/A Able to self cath No  Bladder incontinence No  Frequent urination No  Leakage with coughing No  Difficulty starting stream No  Incomplete bladder emptying No    Mobility use a cane use a walker how many minutes can you walk? Unknown ability to climb steps?  yes do you drive?  no use a wheelchair needs help with transfers Do you have any goals in this area?  yes  Function retired I need assistance with the following:  meal prep, household duties and shopping Do you have any goals in this area?  yes  Neuro/Psych bowel control problems weakness numbness trouble walking dizziness depression anxiety  Prior Studies Any changes since last visit?  no New Patient  Physicians involved in your care Any changes since last visit?  yes New Patient   Family History  Problem Relation Age of Onset  . Arthritis Other   . CAD Mother    Social History   Socioeconomic History  . Marital status: Married    Spouse name: Not on file  . Number of children: Not on file  . Years of education: Not on file  . Highest education level: Not on file  Occupational History  . Not on file  Tobacco Use  .  Smoking status: Former Research scientist (life sciences)  . Smokeless tobacco: Never Used  Vaping Use  . Vaping Use: Never used  Substance and Sexual Activity  . Alcohol use: Not Currently  . Drug use: Never  . Sexual activity: Not on file  Other Topics Concern  . Not on file  Social  History Narrative  . Not on file   Social Determinants of Health   Financial Resource Strain: Not on file  Food Insecurity: Not on file  Transportation Needs: Not on file  Physical Activity: Not on file  Stress: Not on file  Social Connections: Not on file   Past Surgical History:  Procedure Laterality Date  . CARDIAC SURGERY    . COLECTOMY WITH COLOSTOMY CREATION/HARTMANN PROCEDURE Left 03/01/2021   Procedure: COLECTOMY WITH COLOSTOMY CREATION/HARTMANN PROCEDURE;  Surgeon: Ronny Bacon, MD;  Location: [facility];  Service: General;  Laterality: Left;  . COLOSTOMY REVISION N/A 03/09/2021   Procedure: COLOSTOMY REVISION;  Surgeon: Ronny Bacon, MD;  Location: [facility];  Service: General;  Laterality: N/A;  . TRACHEOSTOMY TUBE PLACEMENT N/A 03/08/2021   Procedure: TRACHEOSTOMY;  Surgeon: Clyde Canterbury, MD;  Location: [facility];  Service: ENT;  Laterality: N/A;   Past Medical History:  Diagnosis Date  . Hypertension   . Thyroid disease    BP 135/73   Pulse 66   Temp 98.8 F (37.1 C)   Ht 5' 3.5" (1.613 m)   Wt 126 lb 6.4 oz (57.3 kg)   SpO2 98%   BMI 22.04 kg/m   Opioid Risk Score:   Fall Risk Score:  `1  Depression screen PHQ 2/9  Depression screen PHQ 2/9 05/08/2021  Decreased Interest 1  Down, Depressed, Hopeless 2  PHQ - 2 Score 3  Altered sleeping 2  Tired, decreased energy 2  Change in appetite 1  Feeling bad or failure about yourself  2  Trouble concentrating 2  Moving slowly or fidgety/restless 0  Suicidal thoughts 0  PHQ-9 Score 12   Review of Systems  Constitutional: Positive for fatigue.  HENT: Negative for hearing loss.        Right ear decreased hearing  Gastrointestinal:       Colostomy bag  Musculoskeletal: Positive for back pain, gait problem and neck pain.       Drop foot - left foot  Skin: Positive for wound.       abdominal  Surgical wound  Neurological: Positive for dizziness, weakness and numbness. Negative for headaches.   Psychiatric/Behavioral:       Depression  All other systems reviewed and are negative.      Objective:   Physical Exam Vitals and nursing note reviewed.  Constitutional:      Appearance: Normal appearance.  Cardiovascular:     Rate and Rhythm: Normal rate and regular rhythm.     Pulses: Normal pulses.     Heart sounds: Normal heart sounds.  Pulmonary:     Effort: Pulmonary effort is normal.     Breath sounds: Normal breath sounds.  Abdominal:     Palpations: Abdomen is soft.     Comments: Colostomy Noted: Stoma Pink  Musculoskeletal:     Cervical back: Normal range of motion and neck supple.     Comments: Normal Muscle Bulk and Muscle Testing Reveals:  Upper Extremities: Full ROM and Muscle Strength 5/5  Lower Extremities: Full ROM and Muscle Strength 5/5 Wearing Left AFO Arrived in wheelchair  Skin:    General: Skin is warm and dry.  Neurological:  Mental Status: She is alert and oriented to person, place, and time.  Psychiatric:        Mood and Affect: Mood normal.        Behavior: Behavior normal.           Assessment & Plan:  1. Intensive Care Myopathy: Continue home health therapy. Continue to Monitor.  2. Paroxysmal Atrial Fibrillation: Continue current medication regimen. Cardiology Following. Continue to Monitor.  3.  Chronic Combined Systolic and Diastolic Congestive Heart Failure: Continue current medication regimen. Cardiology Following.   4.Physical Debility: [place] with [place]. Continue to Monitor.   F/U with Dr Dagoberto Ligas in 4- 6 weeks

## 2021-05-09 DIAGNOSIS — I252 Old myocardial infarction: Secondary | ICD-10-CM | POA: Diagnosis not present

## 2021-05-09 DIAGNOSIS — J9621 Acute and chronic respiratory failure with hypoxia: Secondary | ICD-10-CM | POA: Diagnosis not present

## 2021-05-09 DIAGNOSIS — Z48815 Encounter for surgical aftercare following surgery on the digestive system: Secondary | ICD-10-CM | POA: Diagnosis not present

## 2021-05-09 DIAGNOSIS — I130 Hypertensive heart and chronic kidney disease with heart failure and stage 1 through stage 4 chronic kidney disease, or unspecified chronic kidney disease: Secondary | ICD-10-CM | POA: Diagnosis not present

## 2021-05-09 DIAGNOSIS — N183 Chronic kidney disease, stage 3 unspecified: Secondary | ICD-10-CM | POA: Diagnosis not present

## 2021-05-09 DIAGNOSIS — I5042 Chronic combined systolic (congestive) and diastolic (congestive) heart failure: Secondary | ICD-10-CM | POA: Diagnosis not present

## 2021-05-09 DIAGNOSIS — I471 Supraventricular tachycardia: Secondary | ICD-10-CM | POA: Diagnosis not present

## 2021-05-09 DIAGNOSIS — I255 Ischemic cardiomyopathy: Secondary | ICD-10-CM | POA: Diagnosis not present

## 2021-05-09 DIAGNOSIS — I480 Paroxysmal atrial fibrillation: Secondary | ICD-10-CM | POA: Diagnosis not present

## 2021-05-11 DIAGNOSIS — I471 Supraventricular tachycardia: Secondary | ICD-10-CM | POA: Diagnosis not present

## 2021-05-11 DIAGNOSIS — I130 Hypertensive heart and chronic kidney disease with heart failure and stage 1 through stage 4 chronic kidney disease, or unspecified chronic kidney disease: Secondary | ICD-10-CM | POA: Diagnosis not present

## 2021-05-11 DIAGNOSIS — I5042 Chronic combined systolic (congestive) and diastolic (congestive) heart failure: Secondary | ICD-10-CM | POA: Diagnosis not present

## 2021-05-11 DIAGNOSIS — J9621 Acute and chronic respiratory failure with hypoxia: Secondary | ICD-10-CM | POA: Diagnosis not present

## 2021-05-11 DIAGNOSIS — N183 Chronic kidney disease, stage 3 unspecified: Secondary | ICD-10-CM | POA: Diagnosis not present

## 2021-05-11 DIAGNOSIS — I255 Ischemic cardiomyopathy: Secondary | ICD-10-CM | POA: Diagnosis not present

## 2021-05-11 DIAGNOSIS — I252 Old myocardial infarction: Secondary | ICD-10-CM | POA: Diagnosis not present

## 2021-05-11 DIAGNOSIS — I480 Paroxysmal atrial fibrillation: Secondary | ICD-10-CM | POA: Diagnosis not present

## 2021-05-11 DIAGNOSIS — Z48815 Encounter for surgical aftercare following surgery on the digestive system: Secondary | ICD-10-CM | POA: Diagnosis not present

## 2021-05-14 ENCOUNTER — Encounter: Payer: Self-pay | Admitting: Registered Nurse

## 2021-05-14 DIAGNOSIS — I252 Old myocardial infarction: Secondary | ICD-10-CM | POA: Diagnosis not present

## 2021-05-14 DIAGNOSIS — I130 Hypertensive heart and chronic kidney disease with heart failure and stage 1 through stage 4 chronic kidney disease, or unspecified chronic kidney disease: Secondary | ICD-10-CM | POA: Diagnosis not present

## 2021-05-14 DIAGNOSIS — N183 Chronic kidney disease, stage 3 unspecified: Secondary | ICD-10-CM | POA: Diagnosis not present

## 2021-05-14 DIAGNOSIS — I471 Supraventricular tachycardia: Secondary | ICD-10-CM | POA: Diagnosis not present

## 2021-05-14 DIAGNOSIS — I255 Ischemic cardiomyopathy: Secondary | ICD-10-CM | POA: Diagnosis not present

## 2021-05-14 DIAGNOSIS — Z48815 Encounter for surgical aftercare following surgery on the digestive system: Secondary | ICD-10-CM | POA: Diagnosis not present

## 2021-05-14 DIAGNOSIS — J9621 Acute and chronic respiratory failure with hypoxia: Secondary | ICD-10-CM | POA: Diagnosis not present

## 2021-05-14 DIAGNOSIS — I480 Paroxysmal atrial fibrillation: Secondary | ICD-10-CM | POA: Diagnosis not present

## 2021-05-14 DIAGNOSIS — I5042 Chronic combined systolic (congestive) and diastolic (congestive) heart failure: Secondary | ICD-10-CM | POA: Diagnosis not present

## 2021-05-16 DIAGNOSIS — I5042 Chronic combined systolic (congestive) and diastolic (congestive) heart failure: Secondary | ICD-10-CM | POA: Diagnosis not present

## 2021-05-16 DIAGNOSIS — I471 Supraventricular tachycardia: Secondary | ICD-10-CM | POA: Diagnosis not present

## 2021-05-16 DIAGNOSIS — N183 Chronic kidney disease, stage 3 unspecified: Secondary | ICD-10-CM | POA: Diagnosis not present

## 2021-05-16 DIAGNOSIS — Z48815 Encounter for surgical aftercare following surgery on the digestive system: Secondary | ICD-10-CM | POA: Diagnosis not present

## 2021-05-16 DIAGNOSIS — I130 Hypertensive heart and chronic kidney disease with heart failure and stage 1 through stage 4 chronic kidney disease, or unspecified chronic kidney disease: Secondary | ICD-10-CM | POA: Diagnosis not present

## 2021-05-16 DIAGNOSIS — I252 Old myocardial infarction: Secondary | ICD-10-CM | POA: Diagnosis not present

## 2021-05-16 DIAGNOSIS — I480 Paroxysmal atrial fibrillation: Secondary | ICD-10-CM | POA: Diagnosis not present

## 2021-05-16 DIAGNOSIS — J9621 Acute and chronic respiratory failure with hypoxia: Secondary | ICD-10-CM | POA: Diagnosis not present

## 2021-05-16 DIAGNOSIS — I255 Ischemic cardiomyopathy: Secondary | ICD-10-CM | POA: Diagnosis not present

## 2021-05-18 DIAGNOSIS — I255 Ischemic cardiomyopathy: Secondary | ICD-10-CM | POA: Diagnosis not present

## 2021-05-18 DIAGNOSIS — I130 Hypertensive heart and chronic kidney disease with heart failure and stage 1 through stage 4 chronic kidney disease, or unspecified chronic kidney disease: Secondary | ICD-10-CM | POA: Diagnosis not present

## 2021-05-18 DIAGNOSIS — I252 Old myocardial infarction: Secondary | ICD-10-CM | POA: Diagnosis not present

## 2021-05-18 DIAGNOSIS — Z48815 Encounter for surgical aftercare following surgery on the digestive system: Secondary | ICD-10-CM | POA: Diagnosis not present

## 2021-05-18 DIAGNOSIS — I471 Supraventricular tachycardia: Secondary | ICD-10-CM | POA: Diagnosis not present

## 2021-05-18 DIAGNOSIS — N183 Chronic kidney disease, stage 3 unspecified: Secondary | ICD-10-CM | POA: Diagnosis not present

## 2021-05-18 DIAGNOSIS — I480 Paroxysmal atrial fibrillation: Secondary | ICD-10-CM | POA: Diagnosis not present

## 2021-05-18 DIAGNOSIS — J9621 Acute and chronic respiratory failure with hypoxia: Secondary | ICD-10-CM | POA: Diagnosis not present

## 2021-05-18 DIAGNOSIS — I5042 Chronic combined systolic (congestive) and diastolic (congestive) heart failure: Secondary | ICD-10-CM | POA: Diagnosis not present

## 2021-05-21 DIAGNOSIS — I5042 Chronic combined systolic (congestive) and diastolic (congestive) heart failure: Secondary | ICD-10-CM | POA: Diagnosis not present

## 2021-05-21 DIAGNOSIS — I252 Old myocardial infarction: Secondary | ICD-10-CM | POA: Diagnosis not present

## 2021-05-21 DIAGNOSIS — I480 Paroxysmal atrial fibrillation: Secondary | ICD-10-CM | POA: Diagnosis not present

## 2021-05-21 DIAGNOSIS — I130 Hypertensive heart and chronic kidney disease with heart failure and stage 1 through stage 4 chronic kidney disease, or unspecified chronic kidney disease: Secondary | ICD-10-CM | POA: Diagnosis not present

## 2021-05-21 DIAGNOSIS — Z48815 Encounter for surgical aftercare following surgery on the digestive system: Secondary | ICD-10-CM | POA: Diagnosis not present

## 2021-05-21 DIAGNOSIS — J9621 Acute and chronic respiratory failure with hypoxia: Secondary | ICD-10-CM | POA: Diagnosis not present

## 2021-05-21 DIAGNOSIS — N183 Chronic kidney disease, stage 3 unspecified: Secondary | ICD-10-CM | POA: Diagnosis not present

## 2021-05-21 DIAGNOSIS — I471 Supraventricular tachycardia: Secondary | ICD-10-CM | POA: Diagnosis not present

## 2021-05-21 DIAGNOSIS — I255 Ischemic cardiomyopathy: Secondary | ICD-10-CM | POA: Diagnosis not present

## 2021-05-22 DIAGNOSIS — I471 Supraventricular tachycardia: Secondary | ICD-10-CM | POA: Diagnosis not present

## 2021-05-22 DIAGNOSIS — I252 Old myocardial infarction: Secondary | ICD-10-CM | POA: Diagnosis not present

## 2021-05-22 DIAGNOSIS — I130 Hypertensive heart and chronic kidney disease with heart failure and stage 1 through stage 4 chronic kidney disease, or unspecified chronic kidney disease: Secondary | ICD-10-CM | POA: Diagnosis not present

## 2021-05-22 DIAGNOSIS — J9621 Acute and chronic respiratory failure with hypoxia: Secondary | ICD-10-CM | POA: Diagnosis not present

## 2021-05-22 DIAGNOSIS — I480 Paroxysmal atrial fibrillation: Secondary | ICD-10-CM | POA: Diagnosis not present

## 2021-05-22 DIAGNOSIS — Z48815 Encounter for surgical aftercare following surgery on the digestive system: Secondary | ICD-10-CM | POA: Diagnosis not present

## 2021-05-22 DIAGNOSIS — I255 Ischemic cardiomyopathy: Secondary | ICD-10-CM | POA: Diagnosis not present

## 2021-05-22 DIAGNOSIS — N183 Chronic kidney disease, stage 3 unspecified: Secondary | ICD-10-CM | POA: Diagnosis not present

## 2021-05-22 DIAGNOSIS — I5042 Chronic combined systolic (congestive) and diastolic (congestive) heart failure: Secondary | ICD-10-CM | POA: Diagnosis not present

## 2021-05-23 ENCOUNTER — Telehealth: Payer: Self-pay

## 2021-05-23 MED ORDER — OXYCODONE HCL 5 MG PO TABS: 5.0000 mg | ORAL_TABLET | Freq: Two times a day (BID) | ORAL | 0 refills | Status: DC | PRN

## 2021-05-23 NOTE — Telephone Encounter (Signed)
Patient called stating she was told on her last visit 05/08/21 to get a refill. Last prescribed by Lang Snow on 04/26/2021 #60. Next appt with Dr. Dagoberto Ligas on 06/06/21

## 2021-05-23 NOTE — Telephone Encounter (Signed)
Return Desiree Madden call, she reports she is having lower back pain and need a refill on her Oxycodone. PMP was reviewed and Oxycodone e-scribed today. Desiree Madden understanding, she has an appointment with Dr. Dagoberto Ligas on 06/06/2021.

## 2021-05-28 DIAGNOSIS — I5042 Chronic combined systolic (congestive) and diastolic (congestive) heart failure: Secondary | ICD-10-CM | POA: Diagnosis not present

## 2021-05-28 DIAGNOSIS — I480 Paroxysmal atrial fibrillation: Secondary | ICD-10-CM | POA: Diagnosis not present

## 2021-05-28 DIAGNOSIS — J9621 Acute and chronic respiratory failure with hypoxia: Secondary | ICD-10-CM | POA: Diagnosis not present

## 2021-05-28 DIAGNOSIS — I471 Supraventricular tachycardia: Secondary | ICD-10-CM | POA: Diagnosis not present

## 2021-05-28 DIAGNOSIS — I255 Ischemic cardiomyopathy: Secondary | ICD-10-CM | POA: Diagnosis not present

## 2021-05-28 DIAGNOSIS — I252 Old myocardial infarction: Secondary | ICD-10-CM | POA: Diagnosis not present

## 2021-05-28 DIAGNOSIS — Z48815 Encounter for surgical aftercare following surgery on the digestive system: Secondary | ICD-10-CM | POA: Diagnosis not present

## 2021-05-28 DIAGNOSIS — I130 Hypertensive heart and chronic kidney disease with heart failure and stage 1 through stage 4 chronic kidney disease, or unspecified chronic kidney disease: Secondary | ICD-10-CM | POA: Diagnosis not present

## 2021-05-28 DIAGNOSIS — N183 Chronic kidney disease, stage 3 unspecified: Secondary | ICD-10-CM | POA: Diagnosis not present

## 2021-05-29 DIAGNOSIS — I255 Ischemic cardiomyopathy: Secondary | ICD-10-CM | POA: Diagnosis not present

## 2021-05-29 DIAGNOSIS — Z48815 Encounter for surgical aftercare following surgery on the digestive system: Secondary | ICD-10-CM | POA: Diagnosis not present

## 2021-05-29 DIAGNOSIS — I480 Paroxysmal atrial fibrillation: Secondary | ICD-10-CM | POA: Diagnosis not present

## 2021-05-29 DIAGNOSIS — J9621 Acute and chronic respiratory failure with hypoxia: Secondary | ICD-10-CM | POA: Diagnosis not present

## 2021-05-29 DIAGNOSIS — I471 Supraventricular tachycardia: Secondary | ICD-10-CM | POA: Diagnosis not present

## 2021-05-29 DIAGNOSIS — I252 Old myocardial infarction: Secondary | ICD-10-CM | POA: Diagnosis not present

## 2021-05-29 DIAGNOSIS — N183 Chronic kidney disease, stage 3 unspecified: Secondary | ICD-10-CM | POA: Diagnosis not present

## 2021-05-29 DIAGNOSIS — I5042 Chronic combined systolic (congestive) and diastolic (congestive) heart failure: Secondary | ICD-10-CM | POA: Diagnosis not present

## 2021-05-29 DIAGNOSIS — I130 Hypertensive heart and chronic kidney disease with heart failure and stage 1 through stage 4 chronic kidney disease, or unspecified chronic kidney disease: Secondary | ICD-10-CM | POA: Diagnosis not present

## 2021-05-30 DIAGNOSIS — I252 Old myocardial infarction: Secondary | ICD-10-CM | POA: Diagnosis not present

## 2021-05-30 DIAGNOSIS — N183 Chronic kidney disease, stage 3 unspecified: Secondary | ICD-10-CM | POA: Diagnosis not present

## 2021-05-30 DIAGNOSIS — Z48815 Encounter for surgical aftercare following surgery on the digestive system: Secondary | ICD-10-CM | POA: Diagnosis not present

## 2021-05-30 DIAGNOSIS — I255 Ischemic cardiomyopathy: Secondary | ICD-10-CM | POA: Diagnosis not present

## 2021-05-30 DIAGNOSIS — I480 Paroxysmal atrial fibrillation: Secondary | ICD-10-CM | POA: Diagnosis not present

## 2021-05-30 DIAGNOSIS — J9621 Acute and chronic respiratory failure with hypoxia: Secondary | ICD-10-CM | POA: Diagnosis not present

## 2021-05-30 DIAGNOSIS — I5042 Chronic combined systolic (congestive) and diastolic (congestive) heart failure: Secondary | ICD-10-CM | POA: Diagnosis not present

## 2021-05-30 DIAGNOSIS — I471 Supraventricular tachycardia: Secondary | ICD-10-CM | POA: Diagnosis not present

## 2021-05-30 DIAGNOSIS — I130 Hypertensive heart and chronic kidney disease with heart failure and stage 1 through stage 4 chronic kidney disease, or unspecified chronic kidney disease: Secondary | ICD-10-CM | POA: Diagnosis not present

## 2021-06-06 ENCOUNTER — Other Ambulatory Visit: Payer: Self-pay | Admitting: Physical Medicine and Rehabilitation

## 2021-06-06 ENCOUNTER — Encounter: Payer: Self-pay | Admitting: Physical Medicine and Rehabilitation

## 2021-06-06 ENCOUNTER — Ambulatory Visit
Admission: RE | Admit: 2021-06-06 | Discharge: 2021-06-06 | Disposition: A | Discharge status: Home / Self Care | Payer: Medicare HMO | Source: Ambulatory Visit | Attending: Physical Medicine and Rehabilitation | Admitting: Physical Medicine and Rehabilitation

## 2021-06-06 ENCOUNTER — Other Ambulatory Visit: Payer: Self-pay

## 2021-06-06 ENCOUNTER — Encounter: Payer: Medicare HMO | Attending: Registered Nurse | Admitting: Physical Medicine and Rehabilitation

## 2021-06-06 VITALS — BP 153/87 | HR 69 | Temp 98.0°F | Ht 63.5 in | Wt 125.6 lb

## 2021-06-06 DIAGNOSIS — G7281 Critical illness myopathy: Secondary | ICD-10-CM

## 2021-06-06 DIAGNOSIS — M21372 Foot drop, left foot: Secondary | ICD-10-CM | POA: Diagnosis not present

## 2021-06-06 DIAGNOSIS — Z48815 Encounter for surgical aftercare following surgery on the digestive system: Secondary | ICD-10-CM | POA: Diagnosis not present

## 2021-06-06 DIAGNOSIS — M546 Pain in thoracic spine: Secondary | ICD-10-CM | POA: Diagnosis not present

## 2021-06-06 DIAGNOSIS — I471 Supraventricular tachycardia: Secondary | ICD-10-CM | POA: Diagnosis not present

## 2021-06-06 DIAGNOSIS — I252 Old myocardial infarction: Secondary | ICD-10-CM | POA: Diagnosis not present

## 2021-06-06 DIAGNOSIS — I5042 Chronic combined systolic (congestive) and diastolic (congestive) heart failure: Secondary | ICD-10-CM | POA: Diagnosis not present

## 2021-06-06 DIAGNOSIS — G894 Chronic pain syndrome: Secondary | ICD-10-CM

## 2021-06-06 DIAGNOSIS — I255 Ischemic cardiomyopathy: Secondary | ICD-10-CM | POA: Diagnosis not present

## 2021-06-06 DIAGNOSIS — I480 Paroxysmal atrial fibrillation: Secondary | ICD-10-CM | POA: Diagnosis not present

## 2021-06-06 DIAGNOSIS — N183 Chronic kidney disease, stage 3 unspecified: Secondary | ICD-10-CM | POA: Diagnosis not present

## 2021-06-06 DIAGNOSIS — J9621 Acute and chronic respiratory failure with hypoxia: Secondary | ICD-10-CM | POA: Diagnosis not present

## 2021-06-06 DIAGNOSIS — I130 Hypertensive heart and chronic kidney disease with heart failure and stage 1 through stage 4 chronic kidney disease, or unspecified chronic kidney disease: Secondary | ICD-10-CM | POA: Diagnosis not present

## 2021-06-06 MED ORDER — DULOXETINE HCL 30 MG PO CPEP: 30.0000 mg | ORAL_CAPSULE | Freq: Every day | ORAL | 3 refills | Status: DC

## 2021-06-06 NOTE — Patient Instructions (Addendum)
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 Also hx of CABG- also previous tachycardia, Afib and mild MI;   Will stop Mirtazapine- for sleep/mood, etc- since at a good weight.  2.   Will start Duloxetine/Cymbalta in its place.    Duloxetine /Cymbalta 30 mg nightly x 1 week  Then 60 mg nightly- for nerve pain  1% of patients can have nausea with Duloxetine- call me if needs an anti-nausea medicine. Can also cause mild dry mouth/dry eyes and mild constipation. Can call me if needs anti-nausea medicine for it.  If needs something for sleep- let's see if Duloxetine helps- if not, let me know.  6. Wear PRAFO on L - can do 2-3x/week- but can wear when awake, just need to wear the boot to prevent losing range in L ankle. If does, won't walk "right" and might lose walking ability.  7. Con't PT- for now 8. Taking Oxycodone 1-2x/day- Last Rx 05/23/21- will renew next month when due- monthly. 9. F/U in 2 months 10. Thoracic xray at [facility]

## 2021-06-06 NOTE — Progress Notes (Signed)
Subjective:    Patient ID: Desiree Madden, female    DOB: 1951-05-29, 70 y.o.   MRN: [id number]  HPI Pt is a 70 yr old female with recent hx of ICU myopathy due to Prolonged hospitalization- L foot drop, partial colectomy, colostomy, abd surgery and previous wound VAC. Also hx of CABG- also previous tachycardia, Afib and mild MI;   Here for hospital f/u on ICU myopathy.   L foor drop- hasn't gotten any worse, better.   Pain- back pain- T11 compression fx- pain doesn't go away. Dull aching pain at T11. Heating pad helps temporarily.  Also has pain higher up- sharper pain- with movement- was there in hospital-  Can feel a knot there.  Gabapentin made her fall.    Mood- still on Remeron 15 mg QHS for mood/appetite/sleep.   Tomorrow- not exercise stress test and nuclear scan and ECHO-  Based on cardiac hx was worried shouldn't have stress test.   Colostomy- doing changing herself- still hates it.  Abd wound healed- completely- a little scab.   Main other issue is foot drop- has a brace for L foot- but not wearing today- was told don't wear it all the time.   Still getting PT til next week- OT is done.     Pain Inventory Average Pain 7 Pain Right Now 5 My pain is constant and aching  In the last 24 hours, has pain interfered with the following? General activity 5 Relation with others 8 Enjoyment of life 5 What TIME of day is your pain at its worst? varies Sleep (in general) Fair  Pain is worse with: some activites Pain improves with: medication Relief from Meds: 8  Family History  Problem Relation Age of Onset   Arthritis Other    CAD Mother    Social History   Socioeconomic History   Marital status: Married    Spouse name: Not on file   Number of children: Not on file   Years of education: Not on file   Highest education level: Not on file  Occupational History   Not on file  Tobacco Use   Smoking status: Former    Pack years: 0.00   Smokeless tobacco:  Never  Vaping Use   Vaping Use: Never used  Substance and Sexual Activity   Alcohol use: Not Currently   Drug use: Never   Sexual activity: Not on file  Other Topics Concern   Not on file  Social History Narrative   Not on file   Social Determinants of Health   Financial Resource Strain: Not on file  Food Insecurity: Not on file  Transportation Needs: Not on file  Physical Activity: Not on file  Stress: Not on file  Social Connections: Not on file   Past Surgical History:  Procedure Laterality Date   CARDIAC SURGERY     COLECTOMY WITH COLOSTOMY CREATION/HARTMANN PROCEDURE Left 03/01/2021   Procedure: COLECTOMY WITH COLOSTOMY CREATION/HARTMANN PROCEDURE;  Surgeon: Ronny Bacon, MD;  Location: [facility];  Service: General;  Laterality: Left;   COLOSTOMY REVISION N/A 03/09/2021   Procedure: COLOSTOMY REVISION;  Surgeon: Ronny Bacon, MD;  Location: [place] [facility];  Service: General;  Laterality: N/A;   TRACHEOSTOMY TUBE PLACEMENT N/A 03/08/2021   Procedure: TRACHEOSTOMY;  Surgeon: Clyde Canterbury, MD;  Location: [facility];  Service: ENT;  Laterality: N/A;   Past Surgical History:  Procedure Laterality Date   CARDIAC SURGERY     COLECTOMY WITH COLOSTOMY CREATION/HARTMANN PROCEDURE Left 03/01/2021   Procedure:  COLECTOMY WITH COLOSTOMY CREATION/HARTMANN PROCEDURE;  Surgeon: Ronny Bacon, MD;  Location: [facility];  Service: General;  Laterality: Left;   COLOSTOMY REVISION N/A 03/09/2021   Procedure: COLOSTOMY REVISION;  Surgeon: Ronny Bacon, MD;  Location: [facility];  Service: General;  Laterality: N/A;   TRACHEOSTOMY TUBE PLACEMENT N/A 03/08/2021   Procedure: TRACHEOSTOMY;  Surgeon: Clyde Canterbury, MD;  Location: [facility];  Service: ENT;  Laterality: N/A;   Past Medical History:  Diagnosis Date   Hypertension    Thyroid disease    BP (!) 153/87   Pulse 69   Temp 98 F (36.7 C)   Ht 5' 3.5" (1.613 m)   Wt 125 lb 9.6 oz (57 kg)   SpO2 100%   BMI 21.90 kg/m   Opioid Risk  Score:   Fall Risk Score:  `1  Depression screen PHQ 2/9  Depression screen [facility] 2/9 06/06/2021 05/08/2021  Decreased Interest 1 1  Down, Depressed, Hopeless 2 2  PHQ - 2 Score 3 3  Altered sleeping - 2  Tired, decreased energy - 2  Change in appetite - 1  Feeling bad or failure about yourself  - 2  Trouble concentrating - 2  Moving slowly or fidgety/restless - 0  Suicidal thoughts - 0  PHQ-9 Score - 12     Review of Systems  Constitutional: Negative.   HENT: Negative.    Eyes: Negative.   Respiratory: Negative.    Cardiovascular: Negative.   Gastrointestinal: Negative.   Endocrine: Negative.   Genitourinary: Negative.   Musculoskeletal:  Positive for back pain and gait problem.  Skin: Negative.   Allergic/Immunologic: Negative.   Hematological:  Bruises/bleeds easily.       Eliquis  Psychiatric/Behavioral:  Positive for dysphoric mood.   All other systems reviewed and are negative.     Objective:   Physical Exam Awake, alert, appropriate, accompanied by daughter, has RW, NAD MS: 4+/5 in biceps, triceps, grip and finger abd B/L  LE's - RLE- HF, KEand PF is 4+/5 except DF is 4-/5  LLE_ HF 4+/5, KE 4+/5 DF 2-/5 and PF 4-/5 Good ROM of L ankle- has 90 degrees of L ankle. (Doesn't wear PRAFO/Prevalon) Neuro: Decreased sensation to light touch from knees downwards- worse on L foot, but overall numb to touch per pt.  Skin: abrasion on R shin and ecchymoses on arms B/L  Colostomy in place      Assessment & Plan:   Pt is a 70 yr old female with recent hx of ICU myopathy due to Prolonged hospitalization- L foot drop, partial colectomy, colostomy, abd surgery and previous wound VAC. Also hx of CABG- also previous tachycardia, Afib and mild MI;   Will stop Mirtazapine- for sleep/mood, etc- since at a good weight.  2.   Will start Duloxetine/Cymbalta in its place.    Duloxetine /Cymbalta 30 mg nightly x 1 week  Then 60 mg nightly- for nerve pain  1% of patients can have  nausea with Duloxetine- call me if needs an anti-nausea medicine. Can also cause mild dry mouth/dry eyes and mild constipation. Can call me if needs anti-nausea medicine for it.  If needs something for sleep- let's see if Duloxetine helps- if not, let me know.  6. Wear PRAFO on L - can do 2-3x/week- but can wear when awake, just need to wear the boot to prevent losing range in L ankle. If does, won't walk "right" and might lose walking ability.  7. Con't PT- for now 8. Taking  Oxycodone 1-2x/day- Last Rx 05/23/21- will renew next month when due- monthly. 9. F/U in 2 months  I spent a total of 30 minutes on visit- discussing pain options, nerve pain and L foot drop-

## 2021-06-07 DIAGNOSIS — I25810 Atherosclerosis of coronary artery bypass graft(s) without angina pectoris: Secondary | ICD-10-CM | POA: Diagnosis not present

## 2021-06-07 DIAGNOSIS — I5042 Chronic combined systolic (congestive) and diastolic (congestive) heart failure: Secondary | ICD-10-CM | POA: Diagnosis not present

## 2021-06-15 DIAGNOSIS — I5042 Chronic combined systolic (congestive) and diastolic (congestive) heart failure: Secondary | ICD-10-CM | POA: Diagnosis not present

## 2021-06-15 DIAGNOSIS — I471 Supraventricular tachycardia: Secondary | ICD-10-CM | POA: Diagnosis not present

## 2021-06-15 DIAGNOSIS — Z48815 Encounter for surgical aftercare following surgery on the digestive system: Secondary | ICD-10-CM | POA: Diagnosis not present

## 2021-06-15 DIAGNOSIS — N183 Chronic kidney disease, stage 3 unspecified: Secondary | ICD-10-CM | POA: Diagnosis not present

## 2021-06-15 DIAGNOSIS — I255 Ischemic cardiomyopathy: Secondary | ICD-10-CM | POA: Diagnosis not present

## 2021-06-15 DIAGNOSIS — I130 Hypertensive heart and chronic kidney disease with heart failure and stage 1 through stage 4 chronic kidney disease, or unspecified chronic kidney disease: Secondary | ICD-10-CM | POA: Diagnosis not present

## 2021-06-15 DIAGNOSIS — I252 Old myocardial infarction: Secondary | ICD-10-CM | POA: Diagnosis not present

## 2021-06-15 DIAGNOSIS — J9621 Acute and chronic respiratory failure with hypoxia: Secondary | ICD-10-CM | POA: Diagnosis not present

## 2021-06-15 DIAGNOSIS — I480 Paroxysmal atrial fibrillation: Secondary | ICD-10-CM | POA: Diagnosis not present

## 2021-06-25 ENCOUNTER — Telehealth: Payer: Self-pay | Admitting: *Deleted

## 2021-06-25 NOTE — Telephone Encounter (Signed)
Desiree Madden is calling about a refill on her hydrocodone from Dr Dagoberto Ligas.  She previously sent a MyChart message on Friday requesting a refill.  Per PMP her last fill date was 05/23/21 #60.  Her next appt is 08/10/21.

## 2021-06-26 MED ORDER — OXYCODONE HCL 5 MG PO TABS: 5.0000 mg | ORAL_TABLET | Freq: Two times a day (BID) | ORAL | 0 refills | Status: DC | PRN

## 2021-06-26 NOTE — Telephone Encounter (Signed)
Refilled Oxycodone 5 mg #60- BID prn- was due/a little overdue.

## 2021-06-27 DIAGNOSIS — I5042 Chronic combined systolic (congestive) and diastolic (congestive) heart failure: Secondary | ICD-10-CM | POA: Diagnosis not present

## 2021-06-27 DIAGNOSIS — I25810 Atherosclerosis of coronary artery bypass graft(s) without angina pectoris: Secondary | ICD-10-CM | POA: Diagnosis not present

## 2021-06-27 DIAGNOSIS — E782 Mixed hyperlipidemia: Secondary | ICD-10-CM | POA: Diagnosis not present

## 2021-06-27 DIAGNOSIS — I480 Paroxysmal atrial fibrillation: Secondary | ICD-10-CM | POA: Diagnosis not present

## 2021-07-18 DIAGNOSIS — J208 Acute bronchitis due to other specified organisms: Secondary | ICD-10-CM | POA: Diagnosis not present

## 2021-07-18 DIAGNOSIS — U071 COVID-19: Secondary | ICD-10-CM | POA: Diagnosis not present

## 2021-07-18 DIAGNOSIS — Z933 Colostomy status: Secondary | ICD-10-CM | POA: Diagnosis not present

## 2021-07-18 DIAGNOSIS — I480 Paroxysmal atrial fibrillation: Secondary | ICD-10-CM | POA: Diagnosis not present

## 2021-07-30 MED ORDER — OXYCODONE HCL 5 MG PO TABS: 5.0000 mg | ORAL_TABLET | Freq: Two times a day (BID) | ORAL | 0 refills | Status: DC | PRN

## 2021-08-10 ENCOUNTER — Encounter: Payer: Self-pay | Admitting: Physical Medicine and Rehabilitation

## 2021-08-10 ENCOUNTER — Encounter: Payer: Medicare HMO | Attending: Registered Nurse | Admitting: Physical Medicine and Rehabilitation

## 2021-08-10 ENCOUNTER — Other Ambulatory Visit: Payer: Self-pay

## 2021-08-10 VITALS — BP 144/79 | HR 68 | Temp 98.2°F | Ht 63.0 in | Wt 131.4 lb

## 2021-08-10 DIAGNOSIS — G7281 Critical illness myopathy: Secondary | ICD-10-CM

## 2021-08-10 DIAGNOSIS — Z5181 Encounter for therapeutic drug level monitoring: Secondary | ICD-10-CM | POA: Diagnosis not present

## 2021-08-10 DIAGNOSIS — M546 Pain in thoracic spine: Secondary | ICD-10-CM | POA: Diagnosis not present

## 2021-08-10 DIAGNOSIS — Z79891 Long term (current) use of opiate analgesic: Secondary | ICD-10-CM | POA: Diagnosis not present

## 2021-08-10 DIAGNOSIS — G894 Chronic pain syndrome: Secondary | ICD-10-CM

## 2021-08-10 DIAGNOSIS — M21372 Foot drop, left foot: Secondary | ICD-10-CM | POA: Diagnosis not present

## 2021-08-10 MED ORDER — METHOCARBAMOL 500 MG PO TABS: 500.0000 mg | ORAL_TABLET | Freq: Three times a day (TID) | ORAL | 3 refills | Status: DC | PRN

## 2021-08-10 NOTE — Progress Notes (Signed)
Subjective:    Patient ID: Desiree Madden, female    DOB: 1951-03-15, 70 y.o.   MRN: [id number]  HPI Pt is a 71 yr old female with recent hx of ICU myopathy due to Prolonged hospitalization- L foot drop, partial colectomy, colostomy, abd surgery and previous wound VAC. Also hx of CABG- also previous tachycardia, Afib and mild MI;   Here for f/u on Icu myopathy/chronic pain   Walking with [place] when using L AFO.  Walks "better without AFO".   Wearing PRAFO 4x/week.  Never has to stop to sit down when walking- walking up and down stairs, outside; not at once- can walk around [employer] with no issues.   Tried Cymbalta- didn't work- made her nauseated and jumpy/jerking with it.   Went back on Remeron- is staying at a stable weight per pt- 6 lbs more than last visit.   Taking Oxycodone- 5 mg BID currently- usually in AM due to back and hip pain.   Switched PCP- new PCP won't prescribe it-   Xray done of thoracic spine- 6/29- showed chronic T11 compression fx, but no other fx or reason to explain upper back pain.  Actually middle back - on the B/L sides- mid thoracic- below scapulae. When lays down, will go away with laying down.  Sounds like muscle spasms- Been so long since on muscle relaxers, doesn't remember the names of meds that worked and didn't work.  None on allergy list.   Not sleeping well- per pt.   Nerve pain intermittent on L foot- deep- not superficial. Now can feel though, which is new.    R hip lateral pain- sleeps on R side specifically.   Pain Inventory Average Pain 6 Pain Right Now 6 My pain is dull and stabbing  In the last 24 hours, has pain interfered with the following? General activity 6 Relation with others 6 Enjoyment of life 6 What TIME of day is your pain at its worst? morning  and evening Sleep (in general) Poor  Pain is worse with: walking and standing Pain improves with: medication Relief from Meds: 3  Family History  Problem Relation Age  of Onset   Arthritis Other    CAD Mother    Social History   Socioeconomic History   Marital status: Married    Spouse name: Not on file   Number of children: Not on file   Years of education: Not on file   Highest education level: Not on file  Occupational History   Not on file  Tobacco Use   Smoking status: Former   Smokeless tobacco: Never  Vaping Use   Vaping Use: Never used  Substance and Sexual Activity   Alcohol use: Not Currently   Drug use: Never   Sexual activity: Not on file  Other Topics Concern   Not on file  Social History Narrative   Not on file   Social Determinants of Health   Financial Resource Strain: Not on file  Food Insecurity: Not on file  Transportation Needs: Not on file  Physical Activity: Not on file  Stress: Not on file  Social Connections: Not on file   Past Surgical History:  Procedure Laterality Date   CARDIAC SURGERY     COLECTOMY WITH COLOSTOMY CREATION/HARTMANN PROCEDURE Left 03/01/2021   Procedure: COLECTOMY WITH COLOSTOMY CREATION/HARTMANN PROCEDURE;  Surgeon: Ronny Bacon, MD;  Location: [facility];  Service: General;  Laterality: Left;   COLOSTOMY REVISION N/A 03/09/2021   Procedure: COLOSTOMY REVISION;  Surgeon:  Ronny Bacon, MD;  Location: [facility];  Service: General;  Laterality: N/A;   TRACHEOSTOMY TUBE PLACEMENT N/A 03/08/2021   Procedure: TRACHEOSTOMY;  Surgeon: Clyde Canterbury, MD;  Location: [facility];  Service: ENT;  Laterality: N/A;   Past Surgical History:  Procedure Laterality Date   CARDIAC SURGERY     COLECTOMY WITH COLOSTOMY CREATION/HARTMANN PROCEDURE Left 03/01/2021   Procedure: COLECTOMY WITH COLOSTOMY CREATION/HARTMANN PROCEDURE;  Surgeon: Ronny Bacon, MD;  Location: [facility];  Service: General;  Laterality: Left;   COLOSTOMY REVISION N/A 03/09/2021   Procedure: COLOSTOMY REVISION;  Surgeon: Ronny Bacon, MD;  Location: [facility];  Service: General;  Laterality: N/A;   TRACHEOSTOMY TUBE PLACEMENT N/A  03/08/2021   Procedure: TRACHEOSTOMY;  Surgeon: Clyde Canterbury, MD;  Location: [facility];  Service: ENT;  Laterality: N/A;   Past Medical History:  Diagnosis Date   Hypertension    Thyroid disease    BP (!) 144/79   Pulse 68   Temp 98.2 F (36.8 C) (Oral)   Ht $R'5\' 3"'qI$  (1.6 m)   Wt 131 lb 6.4 oz (59.6 kg)   SpO2 98%   BMI 23.28 kg/m   Opioid Risk Score:   Fall Risk Score:  `1  Depression screen PHQ 2/9  Depression screen [facility] 2/9 06/06/2021 05/08/2021  Decreased Interest 1 1  Down, Depressed, Hopeless 2 2  PHQ - 2 Score 3 3  Altered sleeping - 2  Tired, decreased energy - 2  Change in appetite - 1  Feeling bad or failure about yourself  - 2  Trouble concentrating - 2  Moving slowly or fidgety/restless - 0  Suicidal thoughts - 0  PHQ-9 Score - 12     Review of Systems  Musculoskeletal:  Positive for back pain.  All other systems reviewed and are negative.     Objective:   Physical Exam Awake, alert, appropriate, poor memory; accompanied by family member, wearing L AFO, NAD "L toe sloughing still".  MS: RLE- HF 4+/5, KE 4+/5, DF 4+/5 and PF 4+/5 LLE- HF 4/5, KE 4+/5, DF 4-/5 and PF 4/5 EHL 2-/5 on L and 5-/5 on R Gait- with L AFO- circumduct's LLE and pigeon toed on L only Without L AFO No significant difference in gait without L AFO- still pigeon toed, mainly on L and knee hyperextends with both ways.  Only has 85-90 degrees passive ROM on L ankle- at least not losing ROM.   TTP over lateral R hip- c/w trochanteric bursitis     Assessment & Plan:    Pt is a 70 yr old female with recent hx of ICU myopathy due to Prolonged hospitalization- L foot drop, partial colectomy, colostomy, abd surgery and previous wound VAC. Also hx of CABG- also previous tachycardia, Afib and mild MI;   Here for f/u on Icu myopathy/chronic pain  Have tried Cymbalta- added to allergy since felt so bad on it- couldn't tolerate it. Went back to Remeron- wait 3 more months and if need to, can  try switching to something else, but also had problems with trazodone and Sertraline.   2. Robaxin/Methocarbemol- for muscle spasms- suggest taking 3x/day AS NEEDED- don't take if doesn't need- suggest not every day- no more than 4 days/week max.  500 mg TID prn- #60- 3 refills.   3. Just filled Oxycodone 8/22- so not due yet. Con't monthly.    4. Not sleeping well- suggest speaking with PCP since she's used the meds I usually use.   5. Would  still wear the PRAFO at night- because lacking L Ankle DF ROM  6. Suggest a L foot up brace- on [place] for <$25-    7. F/U in 3 months- on ICU myopathy- can do R trochanternic bursa injection at f/u.    8. UDS due today per clinic policy.   9. Try Lidocaine patches on R hip- or voltaren gel-   I spent a total of 36 minutes on visit- as detailed above and going  over pain meds, muscle relaxers and trochanteric bursitis.

## 2021-08-10 NOTE — Patient Instructions (Addendum)
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 Also hx of CABG- also previous tachycardia, Afib and mild MI;   Here for f/u on Icu myopathy/chronic pain  Have tried Cymbalta- added to allergy since felt so bad on it- couldn't tolerate it. Went back to Remeron- wait 3 more months and if need to, can try switching to something else, but also had problems with trazodone and Sertraline.   2. Robaxin/Methocarbemol- for muscle spasms- suggest taking 3x/day AS NEEDED- don't take if doesn't need- suggest not every day- no more than 4 days/week max.  500 mg TID prn- #60- 3 refills.   3. Just filled Oxycodone 8/22- so not due yet. Con't monthly.    4. Not sleeping well- suggest speaking with PCP since she's used the meds I usually use.   5. Would still wear the PRAFO at night- because lacking L Ankle DF ROM  6. Suggest a L foot up brace- on [place] for <$25-    7. F/U in 3 months- on ICU myopathy-can do R trochanteric bursa injection at f/u.    8. UDS due today per clinic policy.

## 2021-08-16 ENCOUNTER — Telehealth: Payer: Self-pay | Admitting: *Deleted

## 2021-08-16 LAB — TOXASSURE SELECT,+ANTIDEPR,UR

## 2021-08-16 NOTE — Telephone Encounter (Signed)
Urine drug screen for this encounter is consistent for prescribed medication 

## 2021-08-29 MED ORDER — OXYCODONE HCL 5 MG PO TABS: 5.0000 mg | ORAL_TABLET | Freq: Two times a day (BID) | ORAL | 0 refills | Status: DC | PRN

## 2021-08-29 NOTE — Telephone Encounter (Signed)
Oxy refilled

## 2021-09-07 DIAGNOSIS — Z933 Colostomy status: Secondary | ICD-10-CM | POA: Diagnosis not present

## 2021-10-01 MED ORDER — OXYCODONE HCL 5 MG PO TABS: 5.0000 mg | ORAL_TABLET | Freq: Two times a day (BID) | ORAL | 0 refills | Status: DC | PRN

## 2021-10-02 DIAGNOSIS — I480 Paroxysmal atrial fibrillation: Secondary | ICD-10-CM | POA: Diagnosis not present

## 2021-10-02 DIAGNOSIS — R7309 Other abnormal glucose: Secondary | ICD-10-CM | POA: Diagnosis not present

## 2021-10-02 DIAGNOSIS — Z0000 Encounter for general adult medical examination without abnormal findings: Secondary | ICD-10-CM | POA: Diagnosis not present

## 2021-10-02 DIAGNOSIS — Z933 Colostomy status: Secondary | ICD-10-CM | POA: Diagnosis not present

## 2021-10-02 DIAGNOSIS — E039 Hypothyroidism, unspecified: Secondary | ICD-10-CM | POA: Diagnosis not present

## 2021-10-02 DIAGNOSIS — E782 Mixed hyperlipidemia: Secondary | ICD-10-CM | POA: Diagnosis not present

## 2021-10-02 DIAGNOSIS — I10 Essential (primary) hypertension: Secondary | ICD-10-CM | POA: Diagnosis not present

## 2021-10-02 DIAGNOSIS — Z1231 Encounter for screening mammogram for malignant neoplasm of breast: Secondary | ICD-10-CM | POA: Diagnosis not present

## 2021-10-03 ENCOUNTER — Other Ambulatory Visit: Payer: Self-pay | Admitting: Family Medicine

## 2021-10-03 DIAGNOSIS — Z1231 Encounter for screening mammogram for malignant neoplasm of breast: Secondary | ICD-10-CM

## 2021-10-08 DIAGNOSIS — Z933 Colostomy status: Secondary | ICD-10-CM | POA: Diagnosis not present

## 2021-10-10 DIAGNOSIS — R944 Abnormal results of kidney function studies: Secondary | ICD-10-CM | POA: Diagnosis not present

## 2021-10-29 ENCOUNTER — Encounter: Payer: Self-pay | Admitting: Physical Medicine and Rehabilitation

## 2021-10-29 MED ORDER — OXYCODONE HCL 5 MG PO TABS: 5.0000 mg | ORAL_TABLET | Freq: Two times a day (BID) | ORAL | 0 refills | Status: DC | PRN

## 2021-11-07 DIAGNOSIS — I25810 Atherosclerosis of coronary artery bypass graft(s) without angina pectoris: Secondary | ICD-10-CM | POA: Diagnosis not present

## 2021-11-07 DIAGNOSIS — E782 Mixed hyperlipidemia: Secondary | ICD-10-CM | POA: Diagnosis not present

## 2021-11-07 DIAGNOSIS — I480 Paroxysmal atrial fibrillation: Secondary | ICD-10-CM | POA: Diagnosis not present

## 2021-11-07 DIAGNOSIS — I10 Essential (primary) hypertension: Secondary | ICD-10-CM | POA: Diagnosis not present

## 2021-11-09 ENCOUNTER — Encounter: Payer: Medicare HMO | Attending: Registered Nurse | Admitting: Physical Medicine and Rehabilitation

## 2021-11-09 ENCOUNTER — Other Ambulatory Visit: Payer: Self-pay

## 2021-11-09 ENCOUNTER — Encounter: Payer: Self-pay | Admitting: Physical Medicine and Rehabilitation

## 2021-11-09 VITALS — BP 150/80 | HR 62 | Ht 63.0 in | Wt 136.2 lb

## 2021-11-09 DIAGNOSIS — G7281 Critical illness myopathy: Secondary | ICD-10-CM | POA: Insufficient documentation

## 2021-11-09 DIAGNOSIS — G894 Chronic pain syndrome: Secondary | ICD-10-CM | POA: Insufficient documentation

## 2021-11-09 DIAGNOSIS — M21372 Foot drop, left foot: Secondary | ICD-10-CM | POA: Diagnosis not present

## 2021-11-09 MED ORDER — DULOXETINE HCL 60 MG PO CPEP: 60.0000 mg | ORAL_CAPSULE | Freq: Every day | ORAL | 3 refills | Status: DC

## 2021-11-09 MED ORDER — ONDANSETRON HCL 4 MG PO TABS: 4.0000 mg | ORAL_TABLET | Freq: Three times a day (TID) | ORAL | 0 refills | Status: DC | PRN

## 2021-11-09 NOTE — Progress Notes (Signed)
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 Also hx of CABG- also previous tachycardia, Afib and mild MI;   Here for f/u on Icu myopathy/chronic pain and R trochanteric bursa f/u.    R hip pain is somewhat better- doesn't really want to do steroid injection and thinks it's good enough to avoid the injection.   PCP put her on Remeron- and also put on Xanax as well to help with sleep.  Sleeping 4-5 hours great then wakes up.   Hasn't had the bad, bad pain lately in R hip, but has in back.  Had a compression T11 fx- 12/20 even before hospitalization for ICU myopathy.  Got Oxycodone- From PCP in past.  Asking if there's something else that would help.  Still having constant pain from back pain.  Gabapentin made her dizzy- made her fall on the floor.  Oxycodone lasts for a couple of hours- but then has to go right back to bed.   Her thyroid was really off- Cards took her off Amiodarone as a result.    Walking well-  No feeling in Top L>>R foot.   Colostomy working well for her- no issues.   Was on Duloxetine 30-60 mg nightly- stopped it basically- has been taking 1 here or there.   Exam: Awake, alert, appropriate, no assistive device, NAD No bracing on legs/AFOs.  No more L foot drop-  LLE 5-/5 except DF 4+/5 RLE 5-/5 in all muscles tested.    Plan: New Dx of CKD IV and prediabetes  Just renewed Oxycodone on 10/29/21. Asking if because pt got from PCP in past prior to hospitalization. So asking if she can go back to them.   2.  Had N/V with Duloxetine- take her home meds of 30 mg nightly x 7 days- then take new Rx  60 mg nightly- to try and get back pain under better control. Max dose can use with Cr of 1.8.   3. Will add Zofran/Ondansetron 4 mg 3x/day as needed for nausea until symptoms are better from Duloxetine- usually gets better in 7-10 days.   4.   Will wait on Steroid injection for R hip per pt request.   5. F/U in 3 months    I spent a total of 22 minutes on visit- d/w pt that if PCP was prescribing pain meds, we need to have that occur again.

## 2021-11-09 NOTE — Patient Instructions (Signed)
Plan: New Dx of CKD IV and prediabetes  Just renewed Oxycodone on 10/29/21. Asking if because pt got from PCP in past prior to hospitalization. So asking if she can go back to them.   2.  Had N/V with Duloxetine- take her home meds of 30 mg nightly x 7 days- then take new Rx  60 mg nightly- to try and get back pain under better control. Max dose can use with Cr of 1.8.   3. Will add Zofran/Ondansetron 4 mg 3x/day as needed for nausea until symptoms are better from Duloxetine- usually gets better in 7-10 days.   4.  Will wait on Steroid injection for R hip per pt request.   5. F/U in 3 months

## 2021-11-28 ENCOUNTER — Encounter: Payer: Self-pay | Admitting: Physical Medicine and Rehabilitation

## 2021-11-29 MED ORDER — OXYCODONE HCL 5 MG PO TABS: 5.0000 mg | ORAL_TABLET | Freq: Two times a day (BID) | ORAL | 0 refills | Status: DC | PRN

## 2021-12-19 ENCOUNTER — Other Ambulatory Visit: Payer: Self-pay | Admitting: Nephrology

## 2021-12-19 DIAGNOSIS — D631 Anemia in chronic kidney disease: Secondary | ICD-10-CM | POA: Insufficient documentation

## 2021-12-19 DIAGNOSIS — N183 Chronic kidney disease, stage 3 unspecified: Secondary | ICD-10-CM | POA: Diagnosis not present

## 2021-12-19 DIAGNOSIS — N184 Chronic kidney disease, stage 4 (severe): Secondary | ICD-10-CM

## 2021-12-19 DIAGNOSIS — I480 Paroxysmal atrial fibrillation: Secondary | ICD-10-CM | POA: Diagnosis not present

## 2021-12-19 DIAGNOSIS — Z9049 Acquired absence of other specified parts of digestive tract: Secondary | ICD-10-CM | POA: Insufficient documentation

## 2021-12-19 DIAGNOSIS — Z9889 Other specified postprocedural states: Secondary | ICD-10-CM | POA: Diagnosis not present

## 2021-12-19 DIAGNOSIS — I10 Essential (primary) hypertension: Secondary | ICD-10-CM | POA: Diagnosis not present

## 2021-12-19 DIAGNOSIS — Z933 Colostomy status: Secondary | ICD-10-CM | POA: Diagnosis not present

## 2021-12-19 DIAGNOSIS — K5732 Diverticulitis of large intestine without perforation or abscess without bleeding: Secondary | ICD-10-CM | POA: Diagnosis not present

## 2021-12-19 DIAGNOSIS — N189 Chronic kidney disease, unspecified: Secondary | ICD-10-CM

## 2021-12-28 ENCOUNTER — Other Ambulatory Visit: Payer: Self-pay

## 2021-12-28 ENCOUNTER — Other Ambulatory Visit: Payer: Medicare HMO

## 2021-12-28 ENCOUNTER — Ambulatory Visit
Admission: RE | Admit: 2021-12-28 | Discharge: 2021-12-28 | Disposition: A | Discharge status: Home / Self Care | Payer: Medicare HMO | Source: Ambulatory Visit | Attending: Nephrology | Admitting: Nephrology

## 2021-12-28 DIAGNOSIS — N183 Chronic kidney disease, stage 3 unspecified: Secondary | ICD-10-CM

## 2021-12-28 DIAGNOSIS — D631 Anemia in chronic kidney disease: Secondary | ICD-10-CM | POA: Insufficient documentation

## 2021-12-28 DIAGNOSIS — N184 Chronic kidney disease, stage 4 (severe): Secondary | ICD-10-CM | POA: Diagnosis not present

## 2021-12-28 DIAGNOSIS — N189 Chronic kidney disease, unspecified: Secondary | ICD-10-CM | POA: Insufficient documentation

## 2022-01-30 DIAGNOSIS — I10 Essential (primary) hypertension: Secondary | ICD-10-CM | POA: Diagnosis not present

## 2022-01-30 DIAGNOSIS — Z933 Colostomy status: Secondary | ICD-10-CM | POA: Diagnosis not present

## 2022-01-30 DIAGNOSIS — D631 Anemia in chronic kidney disease: Secondary | ICD-10-CM | POA: Diagnosis not present

## 2022-01-30 DIAGNOSIS — E8722 Chronic metabolic acidosis: Secondary | ICD-10-CM | POA: Diagnosis not present

## 2022-01-30 DIAGNOSIS — K5732 Diverticulitis of large intestine without perforation or abscess without bleeding: Secondary | ICD-10-CM | POA: Diagnosis not present

## 2022-01-30 DIAGNOSIS — I480 Paroxysmal atrial fibrillation: Secondary | ICD-10-CM | POA: Diagnosis not present

## 2022-01-30 DIAGNOSIS — N184 Chronic kidney disease, stage 4 (severe): Secondary | ICD-10-CM | POA: Diagnosis not present

## 2022-01-30 DIAGNOSIS — Z9049 Acquired absence of other specified parts of digestive tract: Secondary | ICD-10-CM | POA: Diagnosis not present

## 2022-01-30 DIAGNOSIS — Z9889 Other specified postprocedural states: Secondary | ICD-10-CM | POA: Diagnosis not present

## 2022-02-13 ENCOUNTER — Encounter: Payer: Medicare HMO | Admitting: Physical Medicine and Rehabilitation

## 2022-02-20 DIAGNOSIS — N183 Chronic kidney disease, stage 3 unspecified: Secondary | ICD-10-CM | POA: Diagnosis not present

## 2022-02-20 DIAGNOSIS — I129 Hypertensive chronic kidney disease with stage 1 through stage 4 chronic kidney disease, or unspecified chronic kidney disease: Secondary | ICD-10-CM | POA: Diagnosis not present

## 2022-02-20 DIAGNOSIS — Z8781 Personal history of (healed) traumatic fracture: Secondary | ICD-10-CM | POA: Diagnosis not present

## 2022-02-20 DIAGNOSIS — M546 Pain in thoracic spine: Secondary | ICD-10-CM | POA: Diagnosis not present

## 2022-02-20 DIAGNOSIS — M545 Low back pain, unspecified: Secondary | ICD-10-CM | POA: Diagnosis not present

## 2022-02-20 DIAGNOSIS — Z933 Colostomy status: Secondary | ICD-10-CM | POA: Diagnosis not present

## 2022-02-22 ENCOUNTER — Other Ambulatory Visit: Payer: Self-pay | Admitting: Physician Assistant

## 2022-02-22 ENCOUNTER — Ambulatory Visit
Admission: RE | Admit: 2022-02-22 | Discharge: 2022-02-22 | Disposition: A | Discharge status: Home / Self Care | Payer: Medicare HMO | Source: Ambulatory Visit | Attending: Physician Assistant | Admitting: Physician Assistant

## 2022-02-22 ENCOUNTER — Other Ambulatory Visit: Payer: Self-pay

## 2022-02-22 DIAGNOSIS — S22000A Wedge compression fracture of unspecified thoracic vertebra, initial encounter for closed fracture: Secondary | ICD-10-CM | POA: Diagnosis not present

## 2022-02-22 DIAGNOSIS — S22060A Wedge compression fracture of T7-T8 vertebra, initial encounter for closed fracture: Secondary | ICD-10-CM | POA: Diagnosis not present

## 2022-02-26 DIAGNOSIS — S22060A Wedge compression fracture of T7-T8 vertebra, initial encounter for closed fracture: Secondary | ICD-10-CM | POA: Diagnosis not present

## 2022-02-26 DIAGNOSIS — Z933 Colostomy status: Secondary | ICD-10-CM | POA: Diagnosis not present

## 2022-03-07 DIAGNOSIS — I25810 Atherosclerosis of coronary artery bypass graft(s) without angina pectoris: Secondary | ICD-10-CM | POA: Diagnosis not present

## 2022-03-07 DIAGNOSIS — N183 Chronic kidney disease, stage 3 unspecified: Secondary | ICD-10-CM | POA: Diagnosis not present

## 2022-03-07 DIAGNOSIS — E782 Mixed hyperlipidemia: Secondary | ICD-10-CM | POA: Diagnosis not present

## 2022-03-07 DIAGNOSIS — I480 Paroxysmal atrial fibrillation: Secondary | ICD-10-CM | POA: Diagnosis not present

## 2022-03-07 DIAGNOSIS — I5042 Chronic combined systolic (congestive) and diastolic (congestive) heart failure: Secondary | ICD-10-CM | POA: Diagnosis not present

## 2022-03-07 DIAGNOSIS — I10 Essential (primary) hypertension: Secondary | ICD-10-CM | POA: Diagnosis not present

## 2022-04-22 DIAGNOSIS — F17210 Nicotine dependence, cigarettes, uncomplicated: Secondary | ICD-10-CM | POA: Diagnosis not present

## 2022-04-22 DIAGNOSIS — G629 Polyneuropathy, unspecified: Secondary | ICD-10-CM | POA: Diagnosis not present

## 2022-04-22 DIAGNOSIS — Z7901 Long term (current) use of anticoagulants: Secondary | ICD-10-CM | POA: Diagnosis not present

## 2022-04-22 DIAGNOSIS — S22060A Wedge compression fracture of T7-T8 vertebra, initial encounter for closed fracture: Secondary | ICD-10-CM | POA: Diagnosis not present

## 2022-04-22 DIAGNOSIS — I480 Paroxysmal atrial fibrillation: Secondary | ICD-10-CM | POA: Diagnosis not present

## 2022-04-22 DIAGNOSIS — Y92096 Garden or yard of other non-institutional residence as the place of occurrence of the external cause: Secondary | ICD-10-CM | POA: Diagnosis not present

## 2022-04-22 DIAGNOSIS — Y990 Civilian activity done for income or pay: Secondary | ICD-10-CM | POA: Diagnosis not present

## 2022-04-22 DIAGNOSIS — M549 Dorsalgia, unspecified: Secondary | ICD-10-CM | POA: Diagnosis not present

## 2022-04-22 DIAGNOSIS — M8580 Other specified disorders of bone density and structure, unspecified site: Secondary | ICD-10-CM | POA: Diagnosis not present

## 2022-04-22 DIAGNOSIS — I509 Heart failure, unspecified: Secondary | ICD-10-CM | POA: Diagnosis not present

## 2022-04-22 DIAGNOSIS — X500XXD Overexertion from strenuous movement or load, subsequent encounter: Secondary | ICD-10-CM | POA: Diagnosis not present

## 2022-04-22 DIAGNOSIS — M8008XA Age-related osteoporosis with current pathological fracture, vertebra(e), initial encounter for fracture: Secondary | ICD-10-CM | POA: Diagnosis not present

## 2022-04-22 DIAGNOSIS — X500XXA Overexertion from strenuous movement or load, initial encounter: Secondary | ICD-10-CM | POA: Diagnosis not present

## 2022-04-22 DIAGNOSIS — N183 Chronic kidney disease, stage 3 unspecified: Secondary | ICD-10-CM | POA: Diagnosis not present

## 2022-04-22 DIAGNOSIS — S22060G Wedge compression fracture of T7-T8 vertebra, subsequent encounter for fracture with delayed healing: Secondary | ICD-10-CM | POA: Diagnosis not present

## 2022-05-15 DIAGNOSIS — I480 Paroxysmal atrial fibrillation: Secondary | ICD-10-CM | POA: Diagnosis not present

## 2022-05-15 DIAGNOSIS — Z9049 Acquired absence of other specified parts of digestive tract: Secondary | ICD-10-CM | POA: Diagnosis not present

## 2022-05-15 DIAGNOSIS — N184 Chronic kidney disease, stage 4 (severe): Secondary | ICD-10-CM | POA: Diagnosis not present

## 2022-05-15 DIAGNOSIS — D631 Anemia in chronic kidney disease: Secondary | ICD-10-CM | POA: Diagnosis not present

## 2022-05-15 DIAGNOSIS — Z933 Colostomy status: Secondary | ICD-10-CM | POA: Diagnosis not present

## 2022-05-15 DIAGNOSIS — Z9889 Other specified postprocedural states: Secondary | ICD-10-CM | POA: Diagnosis not present

## 2022-05-15 DIAGNOSIS — I10 Essential (primary) hypertension: Secondary | ICD-10-CM | POA: Diagnosis not present

## 2022-05-15 DIAGNOSIS — K5732 Diverticulitis of large intestine without perforation or abscess without bleeding: Secondary | ICD-10-CM | POA: Diagnosis not present

## 2022-05-15 DIAGNOSIS — N2581 Secondary hyperparathyroidism of renal origin: Secondary | ICD-10-CM | POA: Diagnosis not present

## 2022-05-17 DIAGNOSIS — X500XXD Overexertion from strenuous movement or load, subsequent encounter: Secondary | ICD-10-CM | POA: Diagnosis not present

## 2022-05-17 DIAGNOSIS — M488X4 Other specified spondylopathies, thoracic region: Secondary | ICD-10-CM | POA: Diagnosis not present

## 2022-05-17 DIAGNOSIS — M8008XA Age-related osteoporosis with current pathological fracture, vertebra(e), initial encounter for fracture: Secondary | ICD-10-CM | POA: Diagnosis not present

## 2022-05-17 DIAGNOSIS — M4694 Unspecified inflammatory spondylopathy, thoracic region: Secondary | ICD-10-CM | POA: Diagnosis not present

## 2022-05-17 DIAGNOSIS — S22060G Wedge compression fracture of T7-T8 vertebra, subsequent encounter for fracture with delayed healing: Secondary | ICD-10-CM | POA: Diagnosis not present

## 2022-05-31 DIAGNOSIS — M8000XG Age-related osteoporosis with current pathological fracture, unspecified site, subsequent encounter for fracture with delayed healing: Secondary | ICD-10-CM | POA: Diagnosis not present

## 2022-06-05 ENCOUNTER — Telehealth: Payer: Self-pay

## 2022-06-05 NOTE — Telephone Encounter (Signed)
Leroy Sea, PA from [facility] called and wanted a return call from Dr. Dagoberto Ligas concerning patient. His number is [phone]

## 2022-07-01 DIAGNOSIS — Z933 Colostomy status: Secondary | ICD-10-CM | POA: Diagnosis not present

## 2022-07-12 DIAGNOSIS — M810 Age-related osteoporosis without current pathological fracture: Secondary | ICD-10-CM | POA: Diagnosis not present

## 2022-07-30 ENCOUNTER — Ambulatory Visit
Admission: RE | Admit: 2022-07-30 | Discharge: 2022-07-30 | Disposition: A | Discharge status: Home / Self Care | Payer: Medicare HMO | Source: Ambulatory Visit | Attending: Family Medicine | Admitting: Family Medicine

## 2022-07-30 DIAGNOSIS — Z1231 Encounter for screening mammogram for malignant neoplasm of breast: Secondary | ICD-10-CM | POA: Insufficient documentation

## 2022-07-31 ENCOUNTER — Other Ambulatory Visit: Payer: Self-pay | Admitting: *Deleted

## 2022-07-31 ENCOUNTER — Inpatient Hospital Stay
Admission: RE | Admit: 2022-07-31 | Discharge: 2022-07-31 | Disposition: A | Discharge status: Home / Self Care | Payer: Self-pay | Source: Ambulatory Visit | Attending: *Deleted | Admitting: *Deleted

## 2022-07-31 DIAGNOSIS — Z1231 Encounter for screening mammogram for malignant neoplasm of breast: Secondary | ICD-10-CM

## 2022-08-01 DIAGNOSIS — Z933 Colostomy status: Secondary | ICD-10-CM | POA: Diagnosis not present

## 2022-08-07 DIAGNOSIS — M8000XA Age-related osteoporosis with current pathological fracture, unspecified site, initial encounter for fracture: Secondary | ICD-10-CM | POA: Diagnosis not present

## 2022-08-07 DIAGNOSIS — Z79899 Other long term (current) drug therapy: Secondary | ICD-10-CM | POA: Diagnosis not present

## 2022-08-21 DIAGNOSIS — N2581 Secondary hyperparathyroidism of renal origin: Secondary | ICD-10-CM | POA: Diagnosis not present

## 2022-08-21 DIAGNOSIS — Z9049 Acquired absence of other specified parts of digestive tract: Secondary | ICD-10-CM | POA: Diagnosis not present

## 2022-08-21 DIAGNOSIS — D631 Anemia in chronic kidney disease: Secondary | ICD-10-CM | POA: Diagnosis not present

## 2022-08-21 DIAGNOSIS — I480 Paroxysmal atrial fibrillation: Secondary | ICD-10-CM | POA: Diagnosis not present

## 2022-08-21 DIAGNOSIS — E8722 Chronic metabolic acidosis: Secondary | ICD-10-CM | POA: Diagnosis not present

## 2022-08-21 DIAGNOSIS — M549 Dorsalgia, unspecified: Secondary | ICD-10-CM | POA: Diagnosis not present

## 2022-08-21 DIAGNOSIS — K5732 Diverticulitis of large intestine without perforation or abscess without bleeding: Secondary | ICD-10-CM | POA: Diagnosis not present

## 2022-08-21 DIAGNOSIS — Z9889 Other specified postprocedural states: Secondary | ICD-10-CM | POA: Diagnosis not present

## 2022-08-21 DIAGNOSIS — Z933 Colostomy status: Secondary | ICD-10-CM | POA: Diagnosis not present

## 2022-09-02 DIAGNOSIS — Z933 Colostomy status: Secondary | ICD-10-CM | POA: Diagnosis not present

## 2022-09-05 DIAGNOSIS — I5042 Chronic combined systolic (congestive) and diastolic (congestive) heart failure: Secondary | ICD-10-CM | POA: Diagnosis not present

## 2022-09-05 DIAGNOSIS — E782 Mixed hyperlipidemia: Secondary | ICD-10-CM | POA: Diagnosis not present

## 2022-09-05 DIAGNOSIS — I480 Paroxysmal atrial fibrillation: Secondary | ICD-10-CM | POA: Diagnosis not present

## 2022-09-05 DIAGNOSIS — I10 Essential (primary) hypertension: Secondary | ICD-10-CM | POA: Diagnosis not present

## 2022-09-05 DIAGNOSIS — I25810 Atherosclerosis of coronary artery bypass graft(s) without angina pectoris: Secondary | ICD-10-CM | POA: Diagnosis not present

## 2022-09-05 DIAGNOSIS — Z951 Presence of aortocoronary bypass graft: Secondary | ICD-10-CM | POA: Diagnosis not present

## 2022-10-02 DIAGNOSIS — Z933 Colostomy status: Secondary | ICD-10-CM | POA: Diagnosis not present

## 2022-10-03 DIAGNOSIS — Z0289 Encounter for other administrative examinations: Secondary | ICD-10-CM | POA: Diagnosis not present

## 2022-10-03 DIAGNOSIS — Z1283 Encounter for screening for malignant neoplasm of skin: Secondary | ICD-10-CM | POA: Diagnosis not present

## 2022-10-03 DIAGNOSIS — I130 Hypertensive heart and chronic kidney disease with heart failure and stage 1 through stage 4 chronic kidney disease, or unspecified chronic kidney disease: Secondary | ICD-10-CM | POA: Diagnosis not present

## 2022-10-03 DIAGNOSIS — E785 Hyperlipidemia, unspecified: Secondary | ICD-10-CM | POA: Diagnosis not present

## 2022-10-03 DIAGNOSIS — E039 Hypothyroidism, unspecified: Secondary | ICD-10-CM | POA: Diagnosis not present

## 2022-10-03 DIAGNOSIS — Z951 Presence of aortocoronary bypass graft: Secondary | ICD-10-CM | POA: Diagnosis not present

## 2022-10-03 DIAGNOSIS — Z0000 Encounter for general adult medical examination without abnormal findings: Secondary | ICD-10-CM | POA: Diagnosis not present

## 2022-10-03 DIAGNOSIS — R7309 Other abnormal glucose: Secondary | ICD-10-CM | POA: Diagnosis not present

## 2022-10-03 DIAGNOSIS — M549 Dorsalgia, unspecified: Secondary | ICD-10-CM | POA: Diagnosis not present

## 2022-10-03 DIAGNOSIS — R52 Pain, unspecified: Secondary | ICD-10-CM | POA: Diagnosis not present

## 2022-10-03 DIAGNOSIS — I4891 Unspecified atrial fibrillation: Secondary | ICD-10-CM | POA: Diagnosis not present

## 2022-10-12 IMAGING — DX DG CHEST 1V PORT
1 series · 1 of 1 positions shown · non-contrast
Comparison: 03/04/2021

CLINICAL DATA: ETT placement

EXAM:
PORTABLE CHEST 1 VIEW

[chest ap]
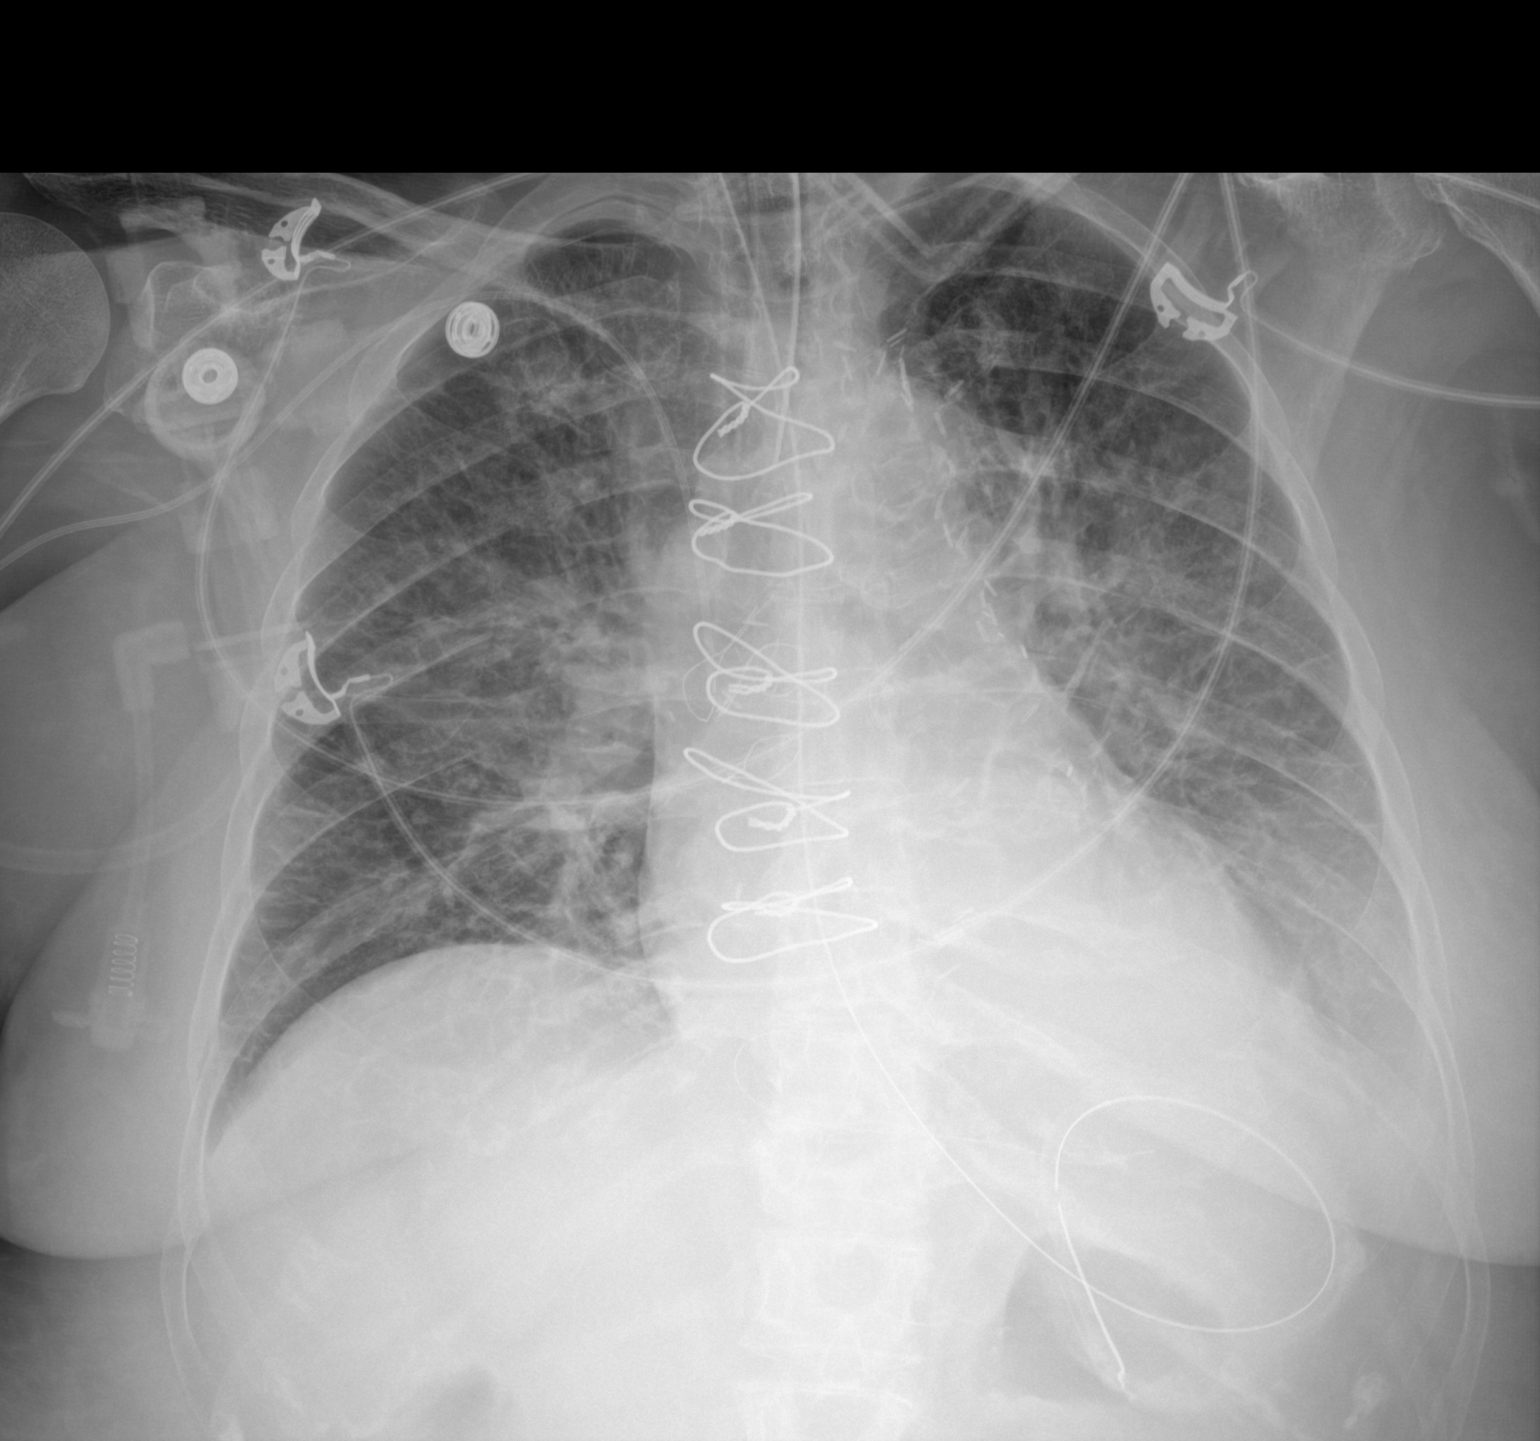

[1 of 1 positions shown; findings below may reference images not displayed]

FINDINGS: Endotracheal tube with the tip 3.2 cm above the carina. Nasogastric
tube coiled in the stomach. Right subclavian central venous catheter
with the tip projecting over the SVC.

Bilateral interstitial and alveolar airspace opacities. Small left
pleural effusion. No pneumothorax. Stable cardiomediastinal
silhouette. No acute osseous abnormality.
IMPRESSION: 1. Endotracheal tube with the tip 3.2 cm above the carina.
2. Nasogastric tube coiled in the stomach.
3. Bilateral interstitial and alveolar airspace opacities concerning
for pulmonary edema.

## 2022-10-14 IMAGING — DX DG ABDOMEN 1V
1 series · 1 of 1 positions shown · non-contrast
Comparison: 03/08/2021.

CLINICAL DATA: NG tube placement.

EXAM:
ABDOMEN - 1 VIEW

[abdomen supine]
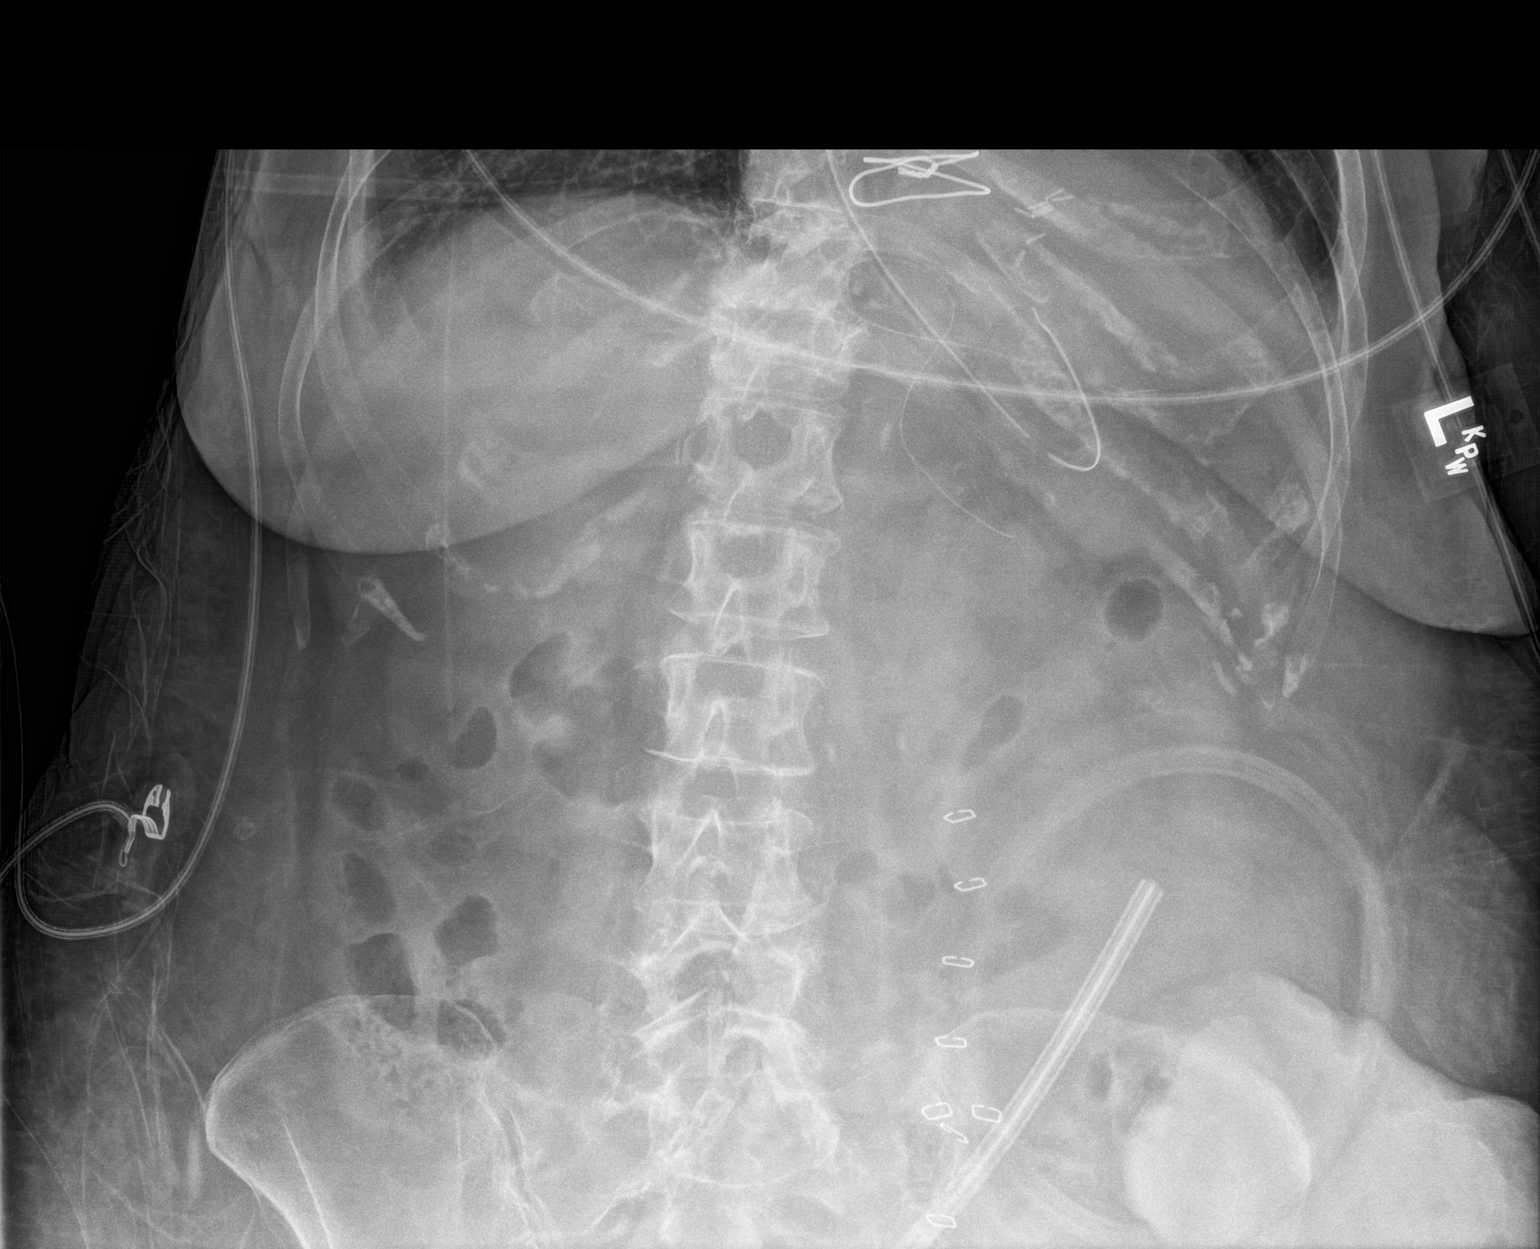

[1 of 1 positions shown; findings below may reference images not displayed]

FINDINGS: NG tube tip coiled in the stomach. No gastric or bowel distention.
Ostomy noted over the left lower quadrant. Drainage catheter noted
left lower quadrant. Prior CABG.
IMPRESSION: NG tube noted with tip coiled in the stomach.

## 2022-11-04 DIAGNOSIS — Z933 Colostomy status: Secondary | ICD-10-CM | POA: Diagnosis not present

## 2022-12-04 DIAGNOSIS — Z933 Colostomy status: Secondary | ICD-10-CM | POA: Diagnosis not present

## 2022-12-23 DIAGNOSIS — I10 Essential (primary) hypertension: Secondary | ICD-10-CM | POA: Diagnosis not present

## 2022-12-23 DIAGNOSIS — N1832 Chronic kidney disease, stage 3b: Secondary | ICD-10-CM | POA: Diagnosis not present

## 2022-12-23 DIAGNOSIS — N2581 Secondary hyperparathyroidism of renal origin: Secondary | ICD-10-CM | POA: Diagnosis not present

## 2023-01-03 DIAGNOSIS — Z933 Colostomy status: Secondary | ICD-10-CM | POA: Diagnosis not present

## 2023-02-03 DIAGNOSIS — Z933 Colostomy status: Secondary | ICD-10-CM | POA: Diagnosis not present

## 2023-02-04 DIAGNOSIS — Z933 Colostomy status: Secondary | ICD-10-CM | POA: Diagnosis not present

## 2023-02-05 ENCOUNTER — Ambulatory Visit
Admission: EM | Admit: 2023-02-05 | Discharge: 2023-02-05 | Disposition: A | Discharge status: Home / Self Care | Payer: Medicare HMO | Attending: Emergency Medicine | Admitting: Emergency Medicine

## 2023-02-05 DIAGNOSIS — S51812A Laceration without foreign body of left forearm, initial encounter: Secondary | ICD-10-CM

## 2023-02-05 MED ORDER — TETANUS-DIPHTHERIA TOXOIDS TD 5-2 LFU IM INJ: 0.5000 mL | INJECTION | Freq: Once | INTRAMUSCULAR | Status: DC

## 2023-02-05 MED ORDER — CEPHALEXIN 500 MG PO CAPS: 500.0000 mg | ORAL_CAPSULE | Freq: Two times a day (BID) | ORAL | 0 refills | Status: AC

## 2023-02-05 MED ORDER — TETANUS-DIPHTH-ACELL PERTUSSIS 5-2.5-18.5 LF-MCG/0.5 IM SUSY: 0.5000 mL | PREFILLED_SYRINGE | Freq: Once | INTRAMUSCULAR | Status: DC

## 2023-02-05 NOTE — Discharge Instructions (Addendum)
Keep the wounds on her forearm and leg clean and dry.  Wash them daily with warm water and mild soap.  Keep them dressed with a nonadherent dressing.  You may apply bacitracin for the next 1 to 2 days until scab is formed and then stop applying bacitracin.  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  Take the 500 mg Keflex capsules twice daily with food for 7 days to prevent infection.  If you develop any increased redness, swelling, heat from the wounds, pus drainage from the wounds, red streaks ascending your arm, or fever you need to return for reevaluation or seek care in the ER.

## 2023-02-05 NOTE — ED Triage Notes (Signed)
Pt was attacked by 2 [place] hens last night at 5pm.  Pt states that she has a lot of nicks along her inner thigh and legs but is most concerned about her left arm  Pt has a skin tear and 4 bruises along the lower arm  Pt has had a tetanus shot 5 years ago.

## 2023-02-05 NOTE — ED Provider Notes (Addendum)
[facility]    CSN: [id number] Arrival date & time: 02/05/23  1531      History   Chief Complaint Chief Complaint  Patient presents with   Abrasion    HPI Desiree Madden is a 72 y.o. female.   HPI  72 year old female here for evaluation of left arm wound.  The patient has a history of hypertension, thyroid disease, combined systolic and diastolic heart failure, paroxysmal A-fib, and CKD stage III who presents for evaluation of a skin tear she sustained to her left forearm along with 2 puncture wounds to her left forearm from [place] hens yesterday evening.  She states that she washed the wounds often have been keeping it covered with a dressing.  She denies any drainage or fever.  Her last tetanus shot was 5 years ago.  Past Medical History:  Diagnosis Date   Hypertension    Thyroid disease     Patient Active Problem List   Diagnosis Date Noted   Acute midline thoracic back pain 06/06/2021   Chronic pain syndrome 06/06/2021   Status post Hartmann's procedure ([place]) 04/26/2021   Chronic combined systolic and diastolic congestive heart failure (HCC)    Acute blood loss anemia    Poor dentition    Anxiety state    PAF (paroxysmal atrial fibrillation) ([place])    Dysphagia    Physical debility 04/03/2021   Intensive care (ICU) myopathy 04/03/2021   Left foot drop 04/03/2021   Protein-calorie malnutrition, severe 03/13/2021   Diverticulitis 02/21/2021   Hyperlipidemia 02/21/2021   Essential hypertension 02/21/2021   Hypothyroidism 02/21/2021   Abnormal ECG 02/21/2021   CKD (chronic kidney disease), stage III ([place]) 02/21/2021   Elevated lactic acid level 02/21/2021    Past Surgical History:  Procedure Laterality Date   CARDIAC SURGERY     COLECTOMY WITH COLOSTOMY CREATION/HARTMANN PROCEDURE Left 03/01/2021   Procedure: COLECTOMY WITH COLOSTOMY CREATION/HARTMANN PROCEDURE;  Surgeon: Ronny Bacon, MD;  Location: [facility];  Service: General;  Laterality:  Left;   COLOSTOMY REVISION N/A 03/09/2021   Procedure: COLOSTOMY REVISION;  Surgeon: Ronny Bacon, MD;  Location: [facility];  Service: General;  Laterality: N/A;   TRACHEOSTOMY TUBE PLACEMENT N/A 03/08/2021   Procedure: TRACHEOSTOMY;  Surgeon: Clyde Canterbury, MD;  Location: [facility];  Service: ENT;  Laterality: N/A;    OB History   No obstetric history on file.      Home Medications    Prior to Admission medications   Medication Sig Start Date End Date Taking? Authorizing Provider  acetaminophen (TYLENOL) 325 MG tablet Take 1-2 tablets (325-650 mg total) by mouth every 4 (four) hours as needed for mild pain. 04/13/21  Yes Love, Ivan Anchors, PA-C  albuterol (VENTOLIN HFA) 108 (90 Base) MCG/ACT inhaler albuterol sulfate HFA 90 mcg/actuation aerosol inhaler  Inhale 2 puffs every 4 hours by inhalation route.   Yes [provider]  ALPRAZolam (XANAX) 0.25 MG tablet Take 1 tablet (0.25 mg total) by mouth 2 (two) times daily as needed for anxiety or sleep. 04/16/21  Yes Angiulli, Lavon Paganini, PA-C  apixaban (ELIQUIS) 5 MG TABS tablet Take 1 tablet (5 mg total) by mouth 2 (two) times daily. 04/16/21  Yes Love, Ivan Anchors, PA-C  ascorbic acid (VITAMIN C) 500 MG tablet Take 1 tablet (500 mg total) by mouth 2 (two) times daily. 04/16/21  Yes Love, Ivan Anchors, PA-C  aspirin 81 MG EC tablet Take 1 tablet by mouth daily. 11/10/16  Yes [provider]  atorvastatin (LIPITOR)  20 MG tablet Take 1 tablet (20 mg total) by mouth daily. 04/16/21  Yes Love, Ivan Anchors, PA-C  azelastine (ASTELIN) 0.1 % nasal spray azelastine 137 mcg (0.1 %) nasal spray aerosol   Yes [provider]  cephALEXin (KEFLEX) 500 MG capsule Take 1 capsule (500 mg total) by mouth 2 (two) times daily for 7 days. 02/05/23 02/12/23 Yes Margarette Canada, NP  cetirizine (ZYRTEC) 10 MG tablet cetirizine 10 mg tablet  Take 1 tablet every day by oral route.   Yes [provider]  chlorhexidine gluconate, MEDLINE KIT, (PERIDEX) 0.12 %  solution 15 mLs by Mouth Rinse route 2 (two) times daily. 04/16/21  Yes Love, Ivan Anchors, PA-C  Cholecalciferol 50 MCG (2000 UT) CAPS Take by mouth.   Yes [provider]  diclofenac Sodium (VOLTAREN) 1 % GEL APPLY 4 GRAMS TO AFFECTED AREA FOUR TIMES DAILY AS DIRECTED 04/09/21  Yes [provider]  dicyclomine (BENTYL) 20 MG tablet dicyclomine 20 mg tablet   Yes [provider]  DULoxetine (CYMBALTA) 60 MG capsule Take 1 capsule (60 mg total) by mouth at bedtime. 11/09/21  Yes Lovorn, Jinny Blossom, MD  fluticasone (FLONASE) 50 MCG/ACT nasal spray Place 2 sprays into both nostrils daily.   Yes [provider]  levothyroxine (SYNTHROID) 75 MCG tablet Take 1 tablet (75 mcg total) by mouth daily at 6 (six) AM. 04/16/21  Yes Love, Ivan Anchors, PA-C  levothyroxine (SYNTHROID) 88 MCG tablet Take 88 mcg by mouth daily. 04/07/21  Yes [provider]  methocarbamol (ROBAXIN) 500 MG tablet Take 1 tablet (500 mg total) by mouth every 8 (eight) hours as needed for muscle spasms. 08/10/21  Yes Lovorn, Jinny Blossom, MD  metoprolol succinate (TOPROL-XL) 25 MG 24 hr tablet Take 0.5 tablets (12.5 mg total) by mouth daily. 04/16/21  Yes Love, Ivan Anchors, PA-C  metoprolol succinate (TOPROL-XL) 50 MG 24 hr tablet  06/12/21  Yes [provider]  mirtazapine (REMERON) 15 MG tablet Take 1 tablet (15 mg total) by mouth at bedtime. 04/16/21  Yes Love, Ivan Anchors, PA-C  Multiple Vitamin (MULTIVITAMIN WITH MINERALS) TABS tablet Take 1 tablet by mouth daily. 04/04/21  Yes Nicole Kindred A, DO  ondansetron (ZOFRAN) 4 MG tablet Take 1 tablet (4 mg total) by mouth every 8 (eight) hours as needed for nausea or vomiting. 11/09/21  Yes Lovorn, Jinny Blossom, MD  Ostomy Supplies (NEW IMAGE DRAINABLE POUCH 57MM) Pouch MISC Use 1 Bag 2 (two) times daily 04/20/21  Yes [provider]  oxyCODONE (OXY IR/ROXICODONE) 5 MG immediate release tablet Take 1 tablet (5 mg total) by mouth 2 (two) times daily as needed for moderate pain  or severe pain. 11/29/21  Yes Lovorn, Jinny Blossom, MD  pantoprazole (PROTONIX) 40 MG tablet Take 1 tablet (40 mg total) by mouth daily. 04/16/21  Yes Love, Ivan Anchors, PA-C  polyethylene glycol powder (GLYCOLAX/MIRALAX) 17 GM/SCOOP powder polyethylene glycol 3350 17 gram oral powder packet   Yes [provider]  tamsulosin (FLOMAX) 0.4 MG CAPS capsule Take by mouth. 04/20/21  Yes [provider]  nitroGLYCERIN (NITROSTAT) 0.4 MG SL tablet Place under the tongue. 04/20/21 04/20/22  [provider]    Family History Family History  Problem Relation Age of Onset   Arthritis Other    CAD Mother     Social History Social History   Tobacco Use   Smoking status: Former   Smokeless tobacco: Never  Scientific laboratory technician Use: Never used  Substance Use Topics  Alcohol use: Not Currently   Drug use: Never     Allergies   Cymbalta [duloxetine hcl], Baclofen, Sertraline, Sulfa antibiotics, Ibuprofen, Mirtazapine, Trazodone, and Trazodone and nefazodone   Review of Systems Review of Systems  Constitutional:  Negative for fever.  Skin:  Positive for wound. Negative for color change.     Physical Exam Triage Vital Signs ED Triage Vitals  Enc Vitals Group     BP 02/05/23 1545 (!) 157/84     Pulse Rate 02/05/23 1545 80     Resp --      Temp 02/05/23 1545 99.2 F (37.3 C)     Temp Source 02/05/23 1545 Oral     SpO2 02/05/23 1545 100 %     Weight 02/05/23 1544 160 lb (72.6 kg)     Height 02/05/23 1544 $RemoveBefor'5\' 4"'WQaIwsgJfTBK$  (1.626 m)     Head Circumference --      Peak Flow --      Pain Score 02/05/23 1544 0     Pain Loc --      Pain Edu? --      Excl. in [place]? --    No data found.  Updated Vital Signs BP (!) 157/84 (BP Location: Right Arm)   Pulse 80   Temp 99.2 F (37.3 C) (Oral)   Ht $R'5\' 4"'Yf$  (1.626 m)   Wt 160 lb (72.6 kg)   SpO2 100%   BMI 27.46 kg/m   Visual Acuity Right Eye Distance:   Left Eye Distance:   Bilateral Distance:    Right Eye Near:   Left Eye Near:     Bilateral Near:     Physical Exam Vitals and nursing note reviewed.  Constitutional:      Appearance: Normal appearance. She is not ill-appearing.  Musculoskeletal:        General: Tenderness and signs of injury present. No swelling or deformity.  Skin:    General: Skin is warm and dry.     Capillary Refill: Capillary refill takes less than 2 seconds.     Findings: Bruising and erythema present.  Neurological:     General: No focal deficit present.     Mental Status: She is alert and oriented to person, place, and time.      UC Treatments / Results  Labs (all labs ordered are listed, but only abnormal results are displayed) Labs Reviewed - No data to display  EKG   Radiology No results found.  Procedures Procedures (including critical care time)  Medications Ordered in UC Medications  tetanus & diphtheria toxoids (adult) (TENIVAC) injection 0.5 mL (has no administration in time range)    Initial Impression / Assessment and Plan / UC Course  I have reviewed the triage vital signs and the nursing notes.  Pertinent labs & imaging results that were available during my care of the patient were reviewed by me and considered in my medical decision making (see chart for details).   Patient is a pleasant, nontoxic-appearing 72 year old female here for evaluation of a skin tear and 2 puncture wounds to her left forearm that she sustained from [place] hens last night.  She has 2 puncture wounds on the superior posterior medial aspect of the forearm as well as a skin tear on the posterior lateral proximal left forearm.  There is also a small scratch on her right lower leg.      All the wounds are clean and dry and they are free of heat and discharge.  There is  no active bleeding.  Given that her last tetanus shot was 5 years ago I will order her tetanus to be updated in clinic.  I will also have the staff to clean the wounds and dressed them with bacitracin and nonadherent  dressings.  The patient has a history of CKD stage III and her most recent renal function panel is from 12/23/2022 showing a BUN of 14 and a creatinine of 1.57.  Her creatinine clearance is calculated out to be 37 mL/min.  Given this creatinine clearance there is no need to make a dosage adjustment for the Keflex that I will prescribe but her max dose is 1000 mg a day.  I will therefore discharge her home on Keflex 500 mg twice daily for 7 days.  ER return precautions reviewed.   Final Clinical Impressions(s) / UC Diagnoses   Final diagnoses:  Skin tear of left forearm without complication, initial encounter     Discharge Instructions      Keep the wounds on her forearm and leg clean and dry.  Wash them daily with warm water and mild soap.  Keep them dressed with a nonadherent dressing.  You may apply bacitracin for the next 1 to 2 days until scab is formed and then stop applying bacitracin.  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  Take the 500 mg Keflex capsules twice daily with food for 7 days to prevent infection.  If you develop any increased redness, swelling, heat from the wounds, pus drainage from the wounds, red streaks ascending your arm, or fever you need to return for reevaluation or seek care in the ER.     ED Prescriptions     Medication Sig Dispense Auth. Provider   cephALEXin (KEFLEX) 500 MG capsule Take 1 capsule (500 mg total) by mouth 2 (two) times daily for 7 days. 14 capsule Margarette Canada, NP      PDMP not reviewed this encounter.   Margarette Canada, NP 02/05/23 1614    Margarette Canada, NP 02/05/23 [phone]

## 2023-03-05 DIAGNOSIS — Z933 Colostomy status: Secondary | ICD-10-CM | POA: Diagnosis not present

## 2023-03-06 DIAGNOSIS — Z951 Presence of aortocoronary bypass graft: Secondary | ICD-10-CM | POA: Diagnosis not present

## 2023-03-06 DIAGNOSIS — E782 Mixed hyperlipidemia: Secondary | ICD-10-CM | POA: Diagnosis not present

## 2023-03-06 DIAGNOSIS — I10 Essential (primary) hypertension: Secondary | ICD-10-CM | POA: Diagnosis not present

## 2023-03-06 DIAGNOSIS — I480 Paroxysmal atrial fibrillation: Secondary | ICD-10-CM | POA: Diagnosis not present

## 2023-03-06 DIAGNOSIS — K5190 Ulcerative colitis, unspecified, without complications: Secondary | ICD-10-CM | POA: Diagnosis not present

## 2023-03-06 DIAGNOSIS — I25810 Atherosclerosis of coronary artery bypass graft(s) without angina pectoris: Secondary | ICD-10-CM | POA: Diagnosis not present

## 2023-03-06 DIAGNOSIS — I5042 Chronic combined systolic (congestive) and diastolic (congestive) heart failure: Secondary | ICD-10-CM | POA: Diagnosis not present

## 2023-04-04 DIAGNOSIS — E538 Deficiency of other specified B group vitamins: Secondary | ICD-10-CM | POA: Diagnosis not present

## 2023-04-04 DIAGNOSIS — E039 Hypothyroidism, unspecified: Secondary | ICD-10-CM | POA: Diagnosis not present

## 2023-04-04 DIAGNOSIS — R7303 Prediabetes: Secondary | ICD-10-CM | POA: Diagnosis not present

## 2023-04-04 DIAGNOSIS — Z933 Colostomy status: Secondary | ICD-10-CM | POA: Diagnosis not present

## 2023-04-04 DIAGNOSIS — I10 Essential (primary) hypertension: Secondary | ICD-10-CM | POA: Diagnosis not present

## 2023-04-04 DIAGNOSIS — H9193 Unspecified hearing loss, bilateral: Secondary | ICD-10-CM | POA: Diagnosis not present

## 2023-04-04 DIAGNOSIS — R5383 Other fatigue: Secondary | ICD-10-CM | POA: Diagnosis not present

## 2023-04-04 DIAGNOSIS — I4891 Unspecified atrial fibrillation: Secondary | ICD-10-CM | POA: Diagnosis not present

## 2023-04-04 DIAGNOSIS — I509 Heart failure, unspecified: Secondary | ICD-10-CM | POA: Diagnosis not present

## 2023-04-04 DIAGNOSIS — I480 Paroxysmal atrial fibrillation: Secondary | ICD-10-CM | POA: Diagnosis not present

## 2023-04-04 DIAGNOSIS — E782 Mixed hyperlipidemia: Secondary | ICD-10-CM | POA: Diagnosis not present

## 2023-04-04 DIAGNOSIS — I25810 Atherosclerosis of coronary artery bypass graft(s) without angina pectoris: Secondary | ICD-10-CM | POA: Diagnosis not present

## 2023-04-04 DIAGNOSIS — Z0000 Encounter for general adult medical examination without abnormal findings: Secondary | ICD-10-CM | POA: Diagnosis not present

## 2023-04-04 DIAGNOSIS — N189 Chronic kidney disease, unspecified: Secondary | ICD-10-CM | POA: Diagnosis not present

## 2023-04-04 DIAGNOSIS — E785 Hyperlipidemia, unspecified: Secondary | ICD-10-CM | POA: Diagnosis not present

## 2023-04-04 DIAGNOSIS — I130 Hypertensive heart and chronic kidney disease with heart failure and stage 1 through stage 4 chronic kidney disease, or unspecified chronic kidney disease: Secondary | ICD-10-CM | POA: Diagnosis not present

## 2023-04-07 DIAGNOSIS — Z933 Colostomy status: Secondary | ICD-10-CM | POA: Diagnosis not present

## 2023-04-22 DIAGNOSIS — I10 Essential (primary) hypertension: Secondary | ICD-10-CM | POA: Diagnosis not present

## 2023-04-22 DIAGNOSIS — N2581 Secondary hyperparathyroidism of renal origin: Secondary | ICD-10-CM | POA: Diagnosis not present

## 2023-04-22 DIAGNOSIS — E538 Deficiency of other specified B group vitamins: Secondary | ICD-10-CM | POA: Diagnosis not present

## 2023-04-22 DIAGNOSIS — N1832 Chronic kidney disease, stage 3b: Secondary | ICD-10-CM | POA: Diagnosis not present

## 2023-04-22 DIAGNOSIS — E8722 Chronic metabolic acidosis: Secondary | ICD-10-CM | POA: Diagnosis not present

## 2023-05-06 DIAGNOSIS — E538 Deficiency of other specified B group vitamins: Secondary | ICD-10-CM | POA: Diagnosis not present

## 2023-05-07 DIAGNOSIS — Z933 Colostomy status: Secondary | ICD-10-CM | POA: Diagnosis not present

## 2023-05-08 DIAGNOSIS — Z933 Colostomy status: Secondary | ICD-10-CM | POA: Diagnosis not present

## 2023-05-20 DIAGNOSIS — E538 Deficiency of other specified B group vitamins: Secondary | ICD-10-CM | POA: Diagnosis not present

## 2023-05-29 DIAGNOSIS — R319 Hematuria, unspecified: Secondary | ICD-10-CM | POA: Diagnosis not present

## 2023-05-29 DIAGNOSIS — R300 Dysuria: Secondary | ICD-10-CM | POA: Diagnosis not present

## 2023-06-06 DIAGNOSIS — Z933 Colostomy status: Secondary | ICD-10-CM | POA: Diagnosis not present

## 2023-06-20 DIAGNOSIS — R300 Dysuria: Secondary | ICD-10-CM | POA: Diagnosis not present

## 2023-06-20 DIAGNOSIS — E538 Deficiency of other specified B group vitamins: Secondary | ICD-10-CM | POA: Diagnosis not present

## 2023-07-07 DIAGNOSIS — Z933 Colostomy status: Secondary | ICD-10-CM | POA: Diagnosis not present

## 2023-07-22 DIAGNOSIS — E538 Deficiency of other specified B group vitamins: Secondary | ICD-10-CM | POA: Diagnosis not present

## 2023-07-30 DIAGNOSIS — N390 Urinary tract infection, site not specified: Secondary | ICD-10-CM | POA: Diagnosis not present

## 2023-07-30 DIAGNOSIS — I959 Hypotension, unspecified: Secondary | ICD-10-CM | POA: Diagnosis not present

## 2023-07-30 DIAGNOSIS — Z8619 Personal history of other infectious and parasitic diseases: Secondary | ICD-10-CM | POA: Diagnosis not present

## 2023-07-30 DIAGNOSIS — N184 Chronic kidney disease, stage 4 (severe): Secondary | ICD-10-CM | POA: Diagnosis not present

## 2023-07-30 DIAGNOSIS — R300 Dysuria: Secondary | ICD-10-CM | POA: Diagnosis not present

## 2023-07-30 DIAGNOSIS — I10 Essential (primary) hypertension: Secondary | ICD-10-CM | POA: Diagnosis not present

## 2023-08-01 ENCOUNTER — Inpatient Hospital Stay
Admission: EM | Admit: 2023-08-01 | Discharge: 2023-08-04 | DRG: 871 | Disposition: A | Discharge status: Home / Self Care | Payer: Medicare HMO | Attending: Internal Medicine | Admitting: Internal Medicine

## 2023-08-01 ENCOUNTER — Other Ambulatory Visit: Payer: Self-pay

## 2023-08-01 ENCOUNTER — Emergency Department: Payer: Medicare HMO

## 2023-08-01 DIAGNOSIS — J189 Pneumonia, unspecified organism: Secondary | ICD-10-CM | POA: Insufficient documentation

## 2023-08-01 DIAGNOSIS — E039 Hypothyroidism, unspecified: Secondary | ICD-10-CM | POA: Diagnosis present

## 2023-08-01 DIAGNOSIS — E785 Hyperlipidemia, unspecified: Secondary | ICD-10-CM | POA: Diagnosis present

## 2023-08-01 DIAGNOSIS — Z79899 Other long term (current) drug therapy: Secondary | ICD-10-CM

## 2023-08-01 DIAGNOSIS — A419 Sepsis, unspecified organism: Principal | ICD-10-CM | POA: Diagnosis present

## 2023-08-01 DIAGNOSIS — Z66 Do not resuscitate: Secondary | ICD-10-CM | POA: Diagnosis present

## 2023-08-01 DIAGNOSIS — I2510 Atherosclerotic heart disease of native coronary artery without angina pectoris: Secondary | ICD-10-CM | POA: Diagnosis present

## 2023-08-01 DIAGNOSIS — I129 Hypertensive chronic kidney disease with stage 1 through stage 4 chronic kidney disease, or unspecified chronic kidney disease: Secondary | ICD-10-CM | POA: Diagnosis not present

## 2023-08-01 DIAGNOSIS — N179 Acute kidney failure, unspecified: Secondary | ICD-10-CM | POA: Insufficient documentation

## 2023-08-01 DIAGNOSIS — I480 Paroxysmal atrial fibrillation: Secondary | ICD-10-CM | POA: Diagnosis present

## 2023-08-01 DIAGNOSIS — Z7982 Long term (current) use of aspirin: Secondary | ICD-10-CM

## 2023-08-01 DIAGNOSIS — R6520 Severe sepsis without septic shock: Secondary | ICD-10-CM

## 2023-08-01 DIAGNOSIS — N10 Acute tubulo-interstitial nephritis: Secondary | ICD-10-CM | POA: Diagnosis not present

## 2023-08-01 DIAGNOSIS — Z886 Allergy status to analgesic agent status: Secondary | ICD-10-CM

## 2023-08-01 DIAGNOSIS — D631 Anemia in chronic kidney disease: Secondary | ICD-10-CM | POA: Diagnosis present

## 2023-08-01 DIAGNOSIS — Z8249 Family history of ischemic heart disease and other diseases of the circulatory system: Secondary | ICD-10-CM | POA: Diagnosis not present

## 2023-08-01 DIAGNOSIS — K5790 Diverticulosis of intestine, part unspecified, without perforation or abscess without bleeding: Secondary | ICD-10-CM | POA: Diagnosis not present

## 2023-08-01 DIAGNOSIS — I5042 Chronic combined systolic (congestive) and diastolic (congestive) heart failure: Secondary | ICD-10-CM | POA: Diagnosis present

## 2023-08-01 DIAGNOSIS — J159 Unspecified bacterial pneumonia: Secondary | ICD-10-CM | POA: Diagnosis present

## 2023-08-01 DIAGNOSIS — F32A Depression, unspecified: Secondary | ICD-10-CM | POA: Diagnosis present

## 2023-08-01 DIAGNOSIS — F419 Anxiety disorder, unspecified: Secondary | ICD-10-CM | POA: Diagnosis present

## 2023-08-01 DIAGNOSIS — N184 Chronic kidney disease, stage 4 (severe): Secondary | ICD-10-CM | POA: Diagnosis present

## 2023-08-01 DIAGNOSIS — I509 Heart failure, unspecified: Secondary | ICD-10-CM | POA: Diagnosis not present

## 2023-08-01 DIAGNOSIS — Z7901 Long term (current) use of anticoagulants: Secondary | ICD-10-CM

## 2023-08-01 DIAGNOSIS — Z79890 Hormone replacement therapy (postmenopausal): Secondary | ICD-10-CM

## 2023-08-01 DIAGNOSIS — Z951 Presence of aortocoronary bypass graft: Secondary | ICD-10-CM

## 2023-08-01 DIAGNOSIS — I130 Hypertensive heart and chronic kidney disease with heart failure and stage 1 through stage 4 chronic kidney disease, or unspecified chronic kidney disease: Secondary | ICD-10-CM | POA: Diagnosis not present

## 2023-08-01 DIAGNOSIS — E860 Dehydration: Secondary | ICD-10-CM | POA: Diagnosis present

## 2023-08-01 DIAGNOSIS — E872 Acidosis, unspecified: Secondary | ICD-10-CM | POA: Diagnosis present

## 2023-08-01 DIAGNOSIS — N170 Acute kidney failure with tubular necrosis: Secondary | ICD-10-CM

## 2023-08-01 DIAGNOSIS — I959 Hypotension, unspecified: Secondary | ICD-10-CM | POA: Diagnosis present

## 2023-08-01 DIAGNOSIS — J440 Chronic obstructive pulmonary disease with acute lower respiratory infection: Secondary | ICD-10-CM | POA: Diagnosis present

## 2023-08-01 DIAGNOSIS — Z933 Colostomy status: Secondary | ICD-10-CM

## 2023-08-01 DIAGNOSIS — N1832 Chronic kidney disease, stage 3b: Secondary | ICD-10-CM | POA: Diagnosis not present

## 2023-08-01 DIAGNOSIS — Z882 Allergy status to sulfonamides status: Secondary | ICD-10-CM

## 2023-08-01 DIAGNOSIS — R6521 Severe sepsis with septic shock: Secondary | ICD-10-CM | POA: Diagnosis not present

## 2023-08-01 DIAGNOSIS — Z888 Allergy status to other drugs, medicaments and biological substances status: Secondary | ICD-10-CM

## 2023-08-01 DIAGNOSIS — R918 Other nonspecific abnormal finding of lung field: Secondary | ICD-10-CM | POA: Diagnosis not present

## 2023-08-01 DIAGNOSIS — Z87891 Personal history of nicotine dependence: Secondary | ICD-10-CM

## 2023-08-01 DIAGNOSIS — E8721 Acute metabolic acidosis: Secondary | ICD-10-CM | POA: Diagnosis not present

## 2023-08-01 LAB — COMPREHENSIVE METABOLIC PANEL
ALT: 19 U/L (ref 0–44)
AST: 47 U/L — ABNORMAL HIGH (ref 15–41)
Albumin: 3.1 g/dL — ABNORMAL LOW (ref 3.5–5.0)
Alkaline Phosphatase: 81 U/L (ref 38–126)
Anion gap: 18 — ABNORMAL HIGH (ref 5–15)
BUN: 85 mg/dL — ABNORMAL HIGH (ref 8–23)
CO2: 13 mmol/L — ABNORMAL LOW (ref 22–32)
Calcium: 8.5 mg/dL — ABNORMAL LOW (ref 8.9–10.3)
Chloride: 101 mmol/L (ref 98–111)
Creatinine, Ser: 5.34 mg/dL — ABNORMAL HIGH (ref 0.44–1.00)
GFR, Estimated: 8 mL/min — ABNORMAL LOW (ref >60)
Glucose, Bld: 91 mg/dL (ref 70–99)
Potassium: 4.3 mmol/L (ref 3.5–5.1)
Sodium: 132 mmol/L — ABNORMAL LOW (ref 135–145)
Total Bilirubin: 0.6 mg/dL (ref 0.3–1.2)
Total Protein: 7.6 g/dL (ref 6.5–8.1)

## 2023-08-01 LAB — CBC WITH DIFFERENTIAL/PLATELET
Abs Immature Granulocytes: 0.06 10*3/uL (ref 0.00–0.07)
Basophils Absolute: 0 10*3/uL (ref 0.0–0.1)
Basophils Relative: 0 %
Eosinophils Absolute: 0 10*3/uL (ref 0.0–0.5)
Eosinophils Relative: 0 %
HCT: 30.9 % — ABNORMAL LOW (ref 36.0–46.0)
Hemoglobin: 10.3 g/dL — ABNORMAL LOW (ref 12.0–15.0)
Immature Granulocytes: 1 %
Lymphocytes Relative: 10 %
Lymphs Abs: 1.1 10*3/uL (ref 0.7–4.0)
MCH: 27.6 pg (ref 26.0–34.0)
MCHC: 33.3 g/dL (ref 30.0–36.0)
MCV: 82.8 fL (ref 80.0–100.0)
Monocytes Absolute: 0.5 10*3/uL (ref 0.1–1.0)
Monocytes Relative: 5 %
Neutro Abs: 9 10*3/uL — ABNORMAL HIGH (ref 1.7–7.7)
Neutrophils Relative %: 84 %
Platelets: 378 10*3/uL (ref 150–400)
RBC: 3.73 MIL/uL — ABNORMAL LOW (ref 3.87–5.11)
RDW: 15.1 % (ref 11.5–15.5)
WBC: 10.8 10*3/uL — ABNORMAL HIGH (ref 4.0–10.5)
nRBC: 0 % (ref 0.0–0.2)

## 2023-08-01 LAB — URINALYSIS, W/ REFLEX TO CULTURE (INFECTION SUSPECTED)
Bilirubin Urine: NEGATIVE
Glucose, UA: NEGATIVE mg/dL
Ketones, ur: NEGATIVE mg/dL
Nitrite: NEGATIVE
Protein, ur: NEGATIVE mg/dL
Specific Gravity, Urine: 1.006 (ref 1.005–1.030)
WBC, UA: >50 WBC/hpf (ref 0–5)
pH: 6 (ref 5.0–8.0)

## 2023-08-01 LAB — PROTIME-INR
INR: 3.1 — ABNORMAL HIGH (ref 0.8–1.2)
Prothrombin Time: 32.4 seconds — ABNORMAL HIGH (ref 11.4–15.2)

## 2023-08-01 LAB — BLOOD GAS, VENOUS
Acid-base deficit: 14.3 mmol/L — ABNORMAL HIGH (ref 0.0–2.0)
Bicarbonate: 13.1 mmol/L — ABNORMAL LOW (ref 20.0–28.0)
Patient temperature: 37
pCO2, Ven: 35 mmHg — ABNORMAL LOW (ref 44–60)
pH, Ven: 7.18 — CL (ref 7.25–7.43)

## 2023-08-01 LAB — LACTIC ACID, PLASMA: Lactic Acid, Venous: 1.5 mmol/L (ref 0.5–1.9)

## 2023-08-01 LAB — GLUCOSE, CAPILLARY: Glucose-Capillary: 76 mg/dL (ref 70–99)

## 2023-08-01 LAB — CORTISOL: Cortisol, Plasma: 11.9 ug/dL

## 2023-08-01 LAB — TSH: TSH: 48.563 u[IU]/mL — ABNORMAL HIGH (ref 0.350–4.500)

## 2023-08-01 MED ORDER — SODIUM CHLORIDE 0.9% FLUSH
10.0000 mL | Freq: Two times a day (BID) | INTRAVENOUS | Status: DC
  Administered 2023-08-01 – 2023-08-04 (×5): 10 mL

## 2023-08-01 MED ORDER — METHOCARBAMOL 500 MG PO TABS: 500.0000 mg | ORAL_TABLET | Freq: Three times a day (TID) | ORAL | Status: DC | PRN

## 2023-08-01 MED ORDER — ALBUTEROL SULFATE (2.5 MG/3ML) 0.083% IN NEBU: 3.0000 mL | INHALATION_SOLUTION | Freq: Four times a day (QID) | RESPIRATORY_TRACT | Status: DC | PRN

## 2023-08-01 MED ORDER — SODIUM CHLORIDE 0.9 % IV BOLUS (SEPSIS)
250.0000 mL | Freq: Once | INTRAVENOUS | Status: AC
  Administered 2023-08-01: 250 mL via INTRAVENOUS

## 2023-08-01 MED ORDER — SODIUM BICARBONATE 650 MG PO TABS
650.0000 mg | ORAL_TABLET | Freq: Every day | ORAL | Status: DC
  Administered 2023-08-02 – 2023-08-04 (×3): 650 mg via ORAL
  Filled 2023-08-01 (×4): qty 1

## 2023-08-01 MED ORDER — GUAIFENESIN ER 600 MG PO TB12
600.0000 mg | ORAL_TABLET | Freq: Two times a day (BID) | ORAL | Status: DC
  Administered 2023-08-01 – 2023-08-04 (×6): 600 mg via ORAL
  Filled 2023-08-01 (×6): qty 1

## 2023-08-01 MED ORDER — TAMSULOSIN HCL 0.4 MG PO CAPS
0.4000 mg | ORAL_CAPSULE | Freq: Every day | ORAL | Status: DC
  Administered 2023-08-03: 0.4 mg via ORAL
  Filled 2023-08-01: qty 1

## 2023-08-01 MED ORDER — APIXABAN 5 MG PO TABS
5.0000 mg | ORAL_TABLET | Freq: Two times a day (BID) | ORAL | Status: DC
  Administered 2023-08-01 – 2023-08-04 (×6): 5 mg via ORAL
  Filled 2023-08-01 (×6): qty 1

## 2023-08-01 MED ORDER — MIDODRINE HCL 5 MG PO TABS
2.5000 mg | ORAL_TABLET | Freq: Two times a day (BID) | ORAL | Status: DC
  Administered 2023-08-01: 2.5 mg via ORAL
  Filled 2023-08-01: qty 1

## 2023-08-01 MED ORDER — HYDRALAZINE HCL 20 MG/ML IJ SOLN: 5.0000 mg | Freq: Four times a day (QID) | INTRAMUSCULAR | Status: DC | PRN

## 2023-08-01 MED ORDER — MIRTAZAPINE 15 MG PO TABS
15.0000 mg | ORAL_TABLET | Freq: Every day | ORAL | Status: DC
  Administered 2023-08-01 – 2023-08-03 (×3): 15 mg via ORAL
  Filled 2023-08-01 (×3): qty 1

## 2023-08-01 MED ORDER — SODIUM CHLORIDE 0.45 % IV SOLN
INTRAVENOUS | Status: DC
  Filled 2023-08-01 (×7): qty 75

## 2023-08-01 MED ORDER — AZITHROMYCIN 250 MG PO TABS
500.0000 mg | ORAL_TABLET | Freq: Every day | ORAL | Status: DC
  Administered 2023-08-02 – 2023-08-04 (×3): 500 mg via ORAL
  Filled 2023-08-01 (×3): qty 2

## 2023-08-01 MED ORDER — SODIUM CHLORIDE 0.9 % IV BOLUS: 1000.0000 mL | Freq: Once | INTRAVENOUS | Status: DC

## 2023-08-01 MED ORDER — ASPIRIN 81 MG PO TBEC
81.0000 mg | DELAYED_RELEASE_TABLET | Freq: Every day | ORAL | Status: DC
  Administered 2023-08-02 – 2023-08-04 (×2): 81 mg via ORAL
  Filled 2023-08-01 (×3): qty 1

## 2023-08-01 MED ORDER — LEVOTHYROXINE SODIUM 50 MCG PO TABS: 75.0000 ug | ORAL_TABLET | Freq: Every day | ORAL | Status: DC

## 2023-08-01 MED ORDER — PIPERACILLIN-TAZOBACTAM 3.375 G IVPB 30 MIN
3.3750 g | Freq: Once | INTRAVENOUS | Status: AC
  Administered 2023-08-01: 3.375 g via INTRAVENOUS
  Filled 2023-08-01: qty 50

## 2023-08-01 MED ORDER — ATORVASTATIN CALCIUM 20 MG PO TABS
20.0000 mg | ORAL_TABLET | Freq: Every day | ORAL | Status: DC
  Administered 2023-08-02 – 2023-08-04 (×2): 20 mg via ORAL
  Filled 2023-08-01 (×3): qty 1

## 2023-08-01 MED ORDER — ACETAMINOPHEN 325 MG PO TABS: 325.0000 mg | ORAL_TABLET | ORAL | Status: DC | PRN

## 2023-08-01 MED ORDER — ALPRAZOLAM 0.5 MG PO TABS
0.2500 mg | ORAL_TABLET | Freq: Two times a day (BID) | ORAL | Status: DC | PRN
  Administered 2023-08-01 – 2023-08-03 (×4): 0.25 mg via ORAL
  Filled 2023-08-01 (×4): qty 1

## 2023-08-01 MED ORDER — ONDANSETRON HCL 4 MG PO TABS
4.0000 mg | ORAL_TABLET | Freq: Three times a day (TID) | ORAL | Status: DC | PRN
  Administered 2023-08-03: 4 mg via ORAL
  Filled 2023-08-01: qty 1

## 2023-08-01 MED ORDER — SODIUM CHLORIDE 0.9 % IV SOLN
2.0000 g | INTRAVENOUS | Status: DC
  Administered 2023-08-01 – 2023-08-03 (×3): 2 g via INTRAVENOUS
  Filled 2023-08-01 (×4): qty 20

## 2023-08-01 MED ORDER — SODIUM CHLORIDE 0.9 % IV SOLN
500.0000 mg | Freq: Once | INTRAVENOUS | Status: AC
  Administered 2023-08-01: 500 mg via INTRAVENOUS
  Filled 2023-08-01: qty 5

## 2023-08-01 MED ORDER — ESCITALOPRAM OXALATE 10 MG PO TABS
5.0000 mg | ORAL_TABLET | Freq: Every day | ORAL | Status: DC
  Filled 2023-08-01: qty 1

## 2023-08-01 MED ORDER — AZELASTINE HCL 0.1 % NA SOLN
1.0000 | Freq: Two times a day (BID) | NASAL | Status: DC
  Filled 2023-08-01: qty 30

## 2023-08-01 MED ORDER — LORATADINE 10 MG PO TABS
10.0000 mg | ORAL_TABLET | Freq: Every day | ORAL | Status: DC
  Administered 2023-08-02 – 2023-08-04 (×3): 10 mg via ORAL
  Filled 2023-08-01 (×3): qty 1

## 2023-08-01 MED ORDER — SODIUM CHLORIDE 0.9% FLUSH: 10.0000 mL | INTRAVENOUS | Status: DC | PRN

## 2023-08-01 MED ORDER — DULOXETINE HCL 60 MG PO CPEP: 60.0000 mg | ORAL_CAPSULE | Freq: Every day | ORAL | Status: DC

## 2023-08-01 MED ORDER — LACTATED RINGERS IV BOLUS
1000.0000 mL | Freq: Once | INTRAVENOUS | Status: AC
  Administered 2023-08-01: 1000 mL via INTRAVENOUS

## 2023-08-01 MED ORDER — PANTOPRAZOLE SODIUM 40 MG PO TBEC
40.0000 mg | DELAYED_RELEASE_TABLET | Freq: Every day | ORAL | Status: DC
  Administered 2023-08-02 – 2023-08-04 (×3): 40 mg via ORAL
  Filled 2023-08-01 (×3): qty 1

## 2023-08-01 MED ORDER — OXYCODONE HCL 5 MG PO TABS: 5.0000 mg | ORAL_TABLET | Freq: Two times a day (BID) | ORAL | Status: DC | PRN

## 2023-08-01 MED ORDER — FLUTICASONE PROPIONATE 50 MCG/ACT NA SUSP
2.0000 | Freq: Every day | NASAL | Status: DC
  Administered 2023-08-02 – 2023-08-04 (×3): 2 via NASAL
  Filled 2023-08-01: qty 16

## 2023-08-01 MED ORDER — SODIUM CHLORIDE 0.9 % IV BOLUS (SEPSIS)
1000.0000 mL | Freq: Once | INTRAVENOUS | Status: AC
  Administered 2023-08-01: 1000 mL via INTRAVENOUS

## 2023-08-01 NOTE — ED Notes (Signed)
See triage note, pt reports was seen at [facility] for UTI a couple days ago, started antibiotic. Reports increase in back pain. Denies fevers.

## 2023-08-01 NOTE — ED Notes (Signed)
Purewick placed d/t pt hypotension, unable to ambulate to use restroom

## 2023-08-01 NOTE — ED Notes (Signed)
Pt eating lunch

## 2023-08-01 NOTE — ED Notes (Signed)
2 RN attempt, unable to get 2nd line. IV team consult placed.

## 2023-08-01 NOTE — ED Notes (Signed)
Dr Larinda Buttery to bedside to assess. LR bolus restarted after 2nd line obtained.

## 2023-08-01 NOTE — ED Notes (Addendum)
Dr Larinda Buttery notified of pt hypotensive. Placed in trendelenburg. Attempting 2nd line Pt alert and oriented

## 2023-08-01 NOTE — H&P (Signed)
History and Physical    Desiree Madden BJY:[id number] DOB: 04-17-1951 DOA: 08/01/2023  PCP: [facility] (Confirm with patient/family/NH records and if not entered, this has to be entered at [facility] point of entry) Patient coming from: Home  I have personally briefly reviewed patient's old medical records in [facility]  Chief Complaint: Dysuria, cough worsening of back pain  HPI: Desiree Madden is a 72 y.o. female with medical history significant of CKD stage IV, HTN, hypothyroidism, PAF on Eliquis, CAD/MI with CABG in 2017, chronic combined HFrEF (LVEF 45% in 2022) and HFpEF, spinal stenosis chronic back pain and chronic narcotic dependence, partial colectomy and chronic colostomy bag, presented with worsening of lower abdominal pain dysuria worsening of back pain and cough.  Patient started to have a new dry cough about 2 to 3 weeks ago " due to the heat" denies any sputum production but does have night sweat.  2 weeks ago, patient started to have dysuria burning sensation and lower abdominal pain.  She went to see PCP 2 days ago was diagnosed with UTI and she started to take antibiotics of Omnicef yesterday.  She has a CKD stage IV, and 2 days ago when she visited her PCP it was found her blood pressure in the 70s.  Patient reported that she continued to take blood pressure medications this morning, and she does admit that in the last few days she has felt very dizzy and meantime she lost her appetite and did not eat or drink much for the last 3 to 4 days and became very dehydrated.  ED Course: BP continued to be low in the ED SBP in the lower 80s, nonhypoxic nontachycardic.  CT renal stone study showed no obstructing in the kidney or ureter, incidental finding of right middle lobe consolidation compatible with pneumonia.  Blood work showed creatinine 5.3 compared to her baseline 5.3 several months ago, BUN 85, bicarb 13.  Patient was given Zosyn and azithromycin in the ED  Review of  Systems: As per HPI otherwise 14 point review of systems negative.    Past Medical History:  Diagnosis Date   Hypertension    Thyroid disease     Past Surgical History:  Procedure Laterality Date   CARDIAC SURGERY     COLECTOMY WITH COLOSTOMY CREATION/HARTMANN PROCEDURE Left 03/01/2021   Procedure: COLECTOMY WITH COLOSTOMY CREATION/HARTMANN PROCEDURE;  Surgeon: Campbell Lerner, MD;  Location: [facility] ORS;  Service: General;  Laterality: Left;   COLOSTOMY REVISION N/A 03/09/2021   Procedure: COLOSTOMY REVISION;  Surgeon: Campbell Lerner, MD;  Location: [facility] ORS;  Service: General;  Laterality: N/A;   TRACHEOSTOMY TUBE PLACEMENT N/A 03/08/2021   Procedure: TRACHEOSTOMY;  Surgeon: Geanie Logan, MD;  Location: [facility] ORS;  Service: ENT;  Laterality: N/A;     reports that she has quit smoking. She has never used smokeless tobacco. She reports that she does not currently use alcohol. She reports that she does not use drugs.  Allergies  Allergen Reactions   Cymbalta [Duloxetine Hcl] Nausea And Vomiting   Baclofen     Other reaction(s): Headache Other reaction(s): Headache, Other (See Comments) Other reaction(s): Headache Other reaction(s): Headache    Sertraline     Other reaction(s): Other (See Comments), Other (See Comments) Muscle aches Muscle aches  Other reaction(s): Other (See Comments), Other (See Comments) Muscle aches Other reaction(s): Other (See Comments), Other (See Comments) Muscle aches Muscle aches Muscle aches Muscle aches Other reaction(s): Other (See Comments), Other (See Comments) Muscle aches Muscle aches  Sulfa Antibiotics     Other reaction(s): Other (See Comments), Other (See Comments) Pt was told not to take it due to colitis Pt was told not to take it due to colitis    Ibuprofen Nausea And Vomiting    Other reaction(s): Other (See Comments) Due to stage 3 kidney disease    Mirtazapine     Other reaction(s): Other (See Comments), Other (See  Comments) irritability irritability    Trazodone Diarrhea   Trazodone And Nefazodone     Family History  Problem Relation Age of Onset   Arthritis Other    CAD Mother      Prior to Admission medications   Medication Sig Start Date End Date Taking? Authorizing Provider  cefdinir (OMNICEF) 300 MG capsule Take 300 mg by mouth daily. 07/30/23 08/09/23 Yes [provider]  escitalopram (LEXAPRO) 5 MG tablet Take 5 mg by mouth daily. 06/20/23  Yes [provider]  lisinopril (ZESTRIL) 5 MG tablet Take 5 mg by mouth daily. 03/06/23 03/05/24 Yes [provider]  sodium bicarbonate 650 MG tablet Take 650 mg by mouth daily. 05/21/23  Yes [provider]  acetaminophen (TYLENOL) 325 MG tablet Take 1-2 tablets (325-650 mg total) by mouth every 4 (four) hours as needed for mild pain. 04/13/21   Love, Evlyn Kanner, PA-C  albuterol (VENTOLIN HFA) 108 (90 Base) MCG/ACT inhaler albuterol sulfate HFA 90 mcg/actuation aerosol inhaler  Inhale 2 puffs every 4 hours by inhalation route.    [provider]  ALPRAZolam Prudy Feeler) 0.25 MG tablet Take 1 tablet (0.25 mg total) by mouth 2 (two) times daily as needed for anxiety or sleep. 04/16/21   Angiulli, Mcarthur Rossetti, PA-C  apixaban (ELIQUIS) 5 MG TABS tablet Take 1 tablet (5 mg total) by mouth 2 (two) times daily. 04/16/21   Love, Evlyn Kanner, PA-C  ascorbic acid (VITAMIN C) 500 MG tablet Take 1 tablet (500 mg total) by mouth 2 (two) times daily. 04/16/21   Love, Evlyn Kanner, PA-C  aspirin 81 MG EC tablet Take 1 tablet by mouth daily. 11/10/16   [provider]  atorvastatin (LIPITOR) 20 MG tablet Take 1 tablet (20 mg total) by mouth daily. 04/16/21   Love, Evlyn Kanner, PA-C  azelastine (ASTELIN) 0.1 % nasal spray azelastine 137 mcg (0.1 %) nasal spray aerosol    [provider]  cetirizine (ZYRTEC) 10 MG tablet cetirizine 10 mg tablet  Take 1 tablet every day by oral route.    [provider]  chlorhexidine gluconate,  MEDLINE KIT, (PERIDEX) 0.12 % solution 15 mLs by Mouth Rinse route 2 (two) times daily. 04/16/21   Jacquelynn Cree, PA-C  Cholecalciferol 50 MCG (2000 UT) CAPS Take by mouth.    [provider]  diclofenac Sodium (VOLTAREN) 1 % GEL APPLY 4 GRAMS TO AFFECTED AREA FOUR TIMES DAILY AS DIRECTED 04/09/21   [provider]  dicyclomine (BENTYL) 20 MG tablet dicyclomine 20 mg tablet    [provider]  DULoxetine (CYMBALTA) 60 MG capsule Take 1 capsule (60 mg total) by mouth at bedtime. 11/09/21   Lovorn, Aundra Millet, MD  fluticasone (FLONASE) 50 MCG/ACT nasal spray Place 2 sprays into both nostrils daily.    [provider]  levothyroxine (SYNTHROID) 75 MCG tablet Take 1 tablet (75 mcg total) by mouth daily at 6 (six) AM. 04/16/21   Love, Evlyn Kanner, PA-C  levothyroxine (SYNTHROID) 88 MCG tablet Take 88 mcg by mouth daily. 04/07/21   [provider]  methocarbamol (  ROBAXIN) 500 MG tablet Take 1 tablet (500 mg total) by mouth every 8 (eight) hours as needed for muscle spasms. 08/10/21   Lovorn, Aundra Millet, MD  metoprolol succinate (TOPROL-XL) 25 MG 24 hr tablet Take 0.5 tablets (12.5 mg total) by mouth daily. 04/16/21   Love, Evlyn Kanner, PA-C  metoprolol succinate (TOPROL-XL) 50 MG 24 hr tablet  06/12/21   [provider]  mirtazapine (REMERON) 15 MG tablet Take 1 tablet (15 mg total) by mouth at bedtime. 04/16/21   Love, Evlyn Kanner, PA-C  Multiple Vitamin (MULTIVITAMIN WITH MINERALS) TABS tablet Take 1 tablet by mouth daily. 04/04/21   Pennie Banter, DO  nitroGLYCERIN (NITROSTAT) 0.4 MG SL tablet Place under the tongue. 04/20/21 04/20/22  [provider]  ondansetron (ZOFRAN) 4 MG tablet Take 1 tablet (4 mg total) by mouth every 8 (eight) hours as needed for nausea or vomiting. 11/09/21   Lovorn, Aundra Millet, MD  Ostomy Supplies (NEW IMAGE DRAINABLE POUCH ) Pouch MISC Use 1 Bag 2 (two) times daily 04/20/21   [provider]  oxyCODONE (OXY IR/ROXICODONE) 5 MG immediate  release tablet Take 1 tablet (5 mg total) by mouth 2 (two) times daily as needed for moderate pain or severe pain. 11/29/21   Lovorn, Aundra Millet, MD  pantoprazole (PROTONIX) 40 MG tablet Take 1 tablet (40 mg total) by mouth daily. 04/16/21   Love, Evlyn Kanner, PA-C  polyethylene glycol powder (GLYCOLAX/MIRALAX) 17 GM/SCOOP powder polyethylene glycol 3350 17 gram oral powder packet    [provider]  tamsulosin (FLOMAX) 0.4 MG CAPS capsule Take by mouth. 04/20/21   [provider]    Physical Exam: Vitals:   08/01/23 1300 08/01/23 1348 08/01/23 1400 08/01/23 1410  BP: (!) 112/55  (!) 81/51 (!) 86/64  Pulse:      Resp: 15  17 15   Temp:  97.7 F (36.5 C)    TempSrc:      SpO2:      Weight:      Height:        Constitutional: NAD, calm, comfortable Vitals:   08/01/23 1300 08/01/23 1348 08/01/23 1400 08/01/23 1410  BP: (!) 112/55  (!) 81/51 (!) 86/64  Pulse:      Resp: 15  17 15   Temp:  97.7 F (36.5 C)    TempSrc:      SpO2:      Weight:      Height:       Eyes: PERRL, lids and conjunctivae normal ENMT: Mucous membranes are dry. Posterior pharynx clear of any exudate or lesions.Normal dentition.  Neck: normal, supple, no masses, no thyromegaly Respiratory: clear to auscultation bilaterally, no wheezing, no crackles. Normal respiratory effort. No accessory muscle use.  Cardiovascular: Regular rate and rhythm, systolic murmur on apex. No extremity edema. 2+ pedal pulses. No carotid bruits.  Abdomen: no tenderness, no masses palpated. No hepatosplenomegaly. Bowel sounds positive.  Musculoskeletal: no clubbing / cyanosis. No joint deformity upper and lower extremities. Good ROM, no contractures. Normal muscle tone.  Skin: no rashes, lesions, ulcers. No induration Neurologic: CN 2-12 grossly intact. Sensation intact, DTR normal. Strength 5/5 in all 4.  Psychiatric: Normal judgment and insight. Alert and oriented x 3. Normal mood.     Labs on Admission: I have personally  reviewed following labs and imaging studies  CBC: Recent Labs  Lab 08/01/23 0942  WBC 10.8*  NEUTROABS 9.0*  HGB 10.3*  HCT 30.9*  MCV 82.8  PLT 378   Basic Metabolic Panel:  Recent Labs  Lab 08/01/23 0942  NA 132*  K 4.3  CL 101  CO2 13*  GLUCOSE 91  BUN 85*  CREATININE 5.34*  CALCIUM 8.5*   GFR: Estimated Creatinine Clearance: 9.3 mL/min (A) (by C-G formula based on SCr of 5.34 mg/dL (H)). Liver Function Tests: Recent Labs  Lab 08/01/23 0942  AST 47*  ALT 19  ALKPHOS 81  BILITOT 0.6  PROT 7.6  ALBUMIN 3.1*   No results for input(s): "LIPASE", "AMYLASE" in the last 168 hours. No results for input(s): "AMMONIA" in the last 168 hours. Coagulation Profile: Recent Labs  Lab 08/01/23 0942  INR 3.1*   Cardiac Enzymes: No results for input(s): "CKTOTAL", "CKMB", "CKMBINDEX", "TROPONINI" in the last 168 hours. BNP (last 3 results) No results for input(s): "PROBNP" in the last 8760 hours. HbA1C: No results for input(s): "HGBA1C" in the last 72 hours. CBG: No results for input(s): "GLUCAP" in the last 168 hours. Lipid Profile: No results for input(s): "CHOL", "HDL", "LDLCALC", "TRIG", "CHOLHDL", "LDLDIRECT" in the last 72 hours. Thyroid Function Tests: No results for input(s): "TSH", "T4TOTAL", "FREET4", "T3FREE", "THYROIDAB" in the last 72 hours. Anemia Panel: No results for input(s): "VITAMINB12", "FOLATE", "FERRITIN", "TIBC", "IRON", "RETICCTPCT" in the last 72 hours. Urine analysis:    Component Value Date/Time   COLORURINE YELLOW (A) 08/01/2023 1033   APPEARANCEUR CLOUDY (A) 08/01/2023 1033   LABSPEC 1.006 08/01/2023 1033   PHURINE 6.0 08/01/2023 1033   GLUCOSEU NEGATIVE 08/01/2023 1033   HGBUR LARGE (A) 08/01/2023 1033   BILIRUBINUR NEGATIVE 08/01/2023 1033   KETONESUR NEGATIVE 08/01/2023 1033   PROTEINUR NEGATIVE 08/01/2023 1033   NITRITE NEGATIVE 08/01/2023 1033   LEUKOCYTESUR LARGE (A) 08/01/2023 1033    Radiological Exams on Admission: CT  Renal Stone Study  Result Date: 08/01/2023 CLINICAL DATA:  Back pain and dysuria. Currently being treated for urinary tract infection EXAM: CT ABDOMEN AND PELVIS WITHOUT CONTRAST TECHNIQUE: Multidetector CT imaging of the abdomen and pelvis was performed following the standard protocol without IV contrast. RADIATION DOSE REDUCTION: This exam was performed according to the departmental dose-optimization program which includes automated exposure control, adjustment of the mA and/or kV according to patient size and/or use of iterative reconstruction technique. COMPARISON:  CT abdomen and pelvis dated 02/28/2021, MR thoracic spine dated 02/22/2022 FINDINGS: Lower chest: Right middle lobe consolidation with air bronchograms. No pleural effusion or pneumothorax demonstrated. Partially imaged heart size is normal. Coronary artery calcifications. Hepatobiliary: No focal hepatic lesions. No intra or extrahepatic biliary ductal dilation. Normal gallbladder. Pancreas: No focal lesions or main ductal dilation. Spleen: Normal in size without focal abnormality. Adrenals/Urinary Tract: No adrenal nodules. No suspicious renal mass by noncontrast technique, calculi or hydronephrosis. No focal bladder wall thickening. Stomach/Bowel: Normal appearance of the stomach. Left lower quadrant colostomy. Parastomal hernia contains fat and a short loop of transverse colon. No evidence of bowel wall thickening, distention, or inflammatory changes. Excluded rectum demonstrates diverticulosis. Normal appendix. Vascular/Lymphatic: Aortic atherosclerosis. No enlarged abdominal or pelvic lymph nodes. Reproductive: No adnexal masses. Other: No free fluid, fluid collection, or free air. Musculoskeletal: Multilevel degenerative changes of the partially imaged thoracic and lumbar spine. Interval new superior endplate compression of T10 since 02/22/2022. Unchanged compression of T11 with interval vertebral augmentation as well as partially imaged T8.  IMPRESSION: 1. Right middle lobe consolidation with air bronchograms, suspicious for pneumonia. 2. Interval new superior endplate compression of T10 since 02/22/2022. Unchanged compression deformity of T11 with interval vertebral augmentation as well as at partially  imaged T8. 3. Left lower quadrant colostomy with parastomal hernia containing fat and a short loop of transverse colon. No evidence of bowel obstruction. 4. No hydronephrosis. 5. Aortic Atherosclerosis (ICD10-I70.0). Coronary artery calcifications. Assessment for potential risk factor modification, dietary therapy or pharmacologic therapy may be warranted, if clinically indicated. Electronically Signed   By: Agustin Cree M.D.   On: 08/01/2023 13:12    EKG: Ordered  Assessment/Plan Principal Problem:   Sepsis (HCC) Active Problems:   AKI (acute kidney injury) (HCC)   PNA (pneumonia)   Sepsis secondary to UTI (HCC)  (please populate well all problems here in Problem List. (For example, if patient is on BP meds at home and you resume or decide to hold them, it is a problem that needs to be her. Same for CAD, COPD, HLD and so on)  Severe sepsis -Evidenced by persistently low blood pressure, leukocytosis, and signs of endorgan damage of AKI, source of infection considered to be a UTI plus right middle lobe pneumonia -Received 3.25 L bolus in the ED and clinically still appears to be volume contracted, will order 1 more liter of IV bolus and start midodrine.  Case discussed with on-call nephrologist regarding a second IV access of midline which nephrology agreed. -Continue ceftriaxone and azithromycin to cover UTI and CAP bacterial. -Check TSH and cortisol level -Start midodrine  CAP, bacterial -Antibiotics as above -Breathing treatment -Incentive spirometry and flutter valve -Repeat CT chest in 4 weeks to document resolution of the pneumonia  UTI -As above  AKI on CKD stage IV With decompensated non-anion gap metabolic  acidosis With worsening of uremia -Secondary to sepsis, probably ATN -Case discussed with on-call nephrology Dr. Cherylann Ratel -Currently, clinically there is no indication for emergency HD -Start bicarb drip -Midodrine as above  Chronic HFrEF and HFpEF HTN -Severe volume depletion and sepsis, requiring fluid resuscitation -Monitor BP and volume status, and I&Os -Chest x-ray tomorrow to help determine volume status -Hold off home BP meds, start as needed hydralazine  PAF -No acute concern, continue Eliquis  Anxiety/depression -Continue Remeron, Cymbalta, Lexapro  Hypothyroidism -Continue Synthroid  Colostomy, chronic -Patient reports no significant increase of ostomy output/diarrhea  DVT prophylaxis: Eliquis Code Status: Full code Family Communication: None at bedside Disposition Plan: Patient is sick with sepsis possible pneumonia UTI and AKI on CKD Consults called: Curbside consult with nephrology Admission status: PCU   Emeline General MD [facility] Pager [phone]  08/01/2023, 2:22 PM

## 2023-08-01 NOTE — Progress Notes (Signed)

## 2023-08-01 NOTE — ED Triage Notes (Signed)
Pt here with severe back pain, dizziness, burning with urination and AMS. Pt is currently being treated for a UTI and was told to come to the ED if her symptoms became worse. Pt family states pt bp has also been on the lower end.

## 2023-08-01 NOTE — ED Notes (Signed)
Pt attempting to provide urine sample

## 2023-08-01 NOTE — ED Notes (Signed)
Bladder scanner 0 ml 

## 2023-08-01 NOTE — ED Notes (Signed)
Dr Larinda Buttery notified of ph 7.18, bicarb 13.1

## 2023-08-01 NOTE — Sepsis Progress Note (Signed)
Sepsis protocol is being followed by [facility]. 

## 2023-08-01 NOTE — ED Notes (Signed)
[place] IV infiltrated. IV removed. Dr Homero Fellers notified.

## 2023-08-01 NOTE — ED Notes (Signed)
Dr Chipper Herb notified of difficulty obtaining 2nd access

## 2023-08-01 NOTE — Progress Notes (Addendum)
Midline requested by ER staff.  GFR noted to be 8, Dr Cherylann Ratel nephrologist stated via secure chat it is ok to proceed with placing a midline.

## 2023-08-01 NOTE — Consult Note (Signed)
CODE SEPSIS - PHARMACY COMMUNICATION  **Broad Spectrum Antibiotics should be administered within 1 hour of Sepsis diagnosis**  Time Code Sepsis Called/Page Received: 1105  Antibiotics Ordered: Zosyn  Time of 1st antibiotic administration: 1106  Additional action taken by pharmacy: N/A  Barrie Folk ,PharmD Clinical Pharmacist  08/01/2023  11:08 AM

## 2023-08-01 NOTE — ED Provider Notes (Signed)
[facility] Provider Note    Event Date/Time   First MD Initiated Contact with Patient 08/01/23 1029     (approximate)   History   Chief Complaint Flank Pain  HPI  Desiree Madden is a 72 y.o. female with past medical history of hypertension, hyperlipidemia, atrial fibrillation on Eliquis, CKD, and CHF who presents to the ED for flank pain.  Patient reports that she initially developed dysuria and suprapubic discomfort about 4 days ago, was seen at urgent care and prescribed Omnicef for UTI 2 days ago.  She has had some improvement in her dysuria since starting the antibiotic, but has developed increasing pain in both sides of her back and flank area.  She has felt dizzy and lightheaded at times, daughter also reports that patient was somewhat confused before starting the antibiotics.  Her confusion has improved and daughter denies any confusion currently.  She has not had any fevers, cough, chest pain, shortness of breath, nausea, vomiting, or diarrhea.     Physical Exam   Triage Vital Signs: ED Triage Vitals [08/01/23 0930]  Encounter Vitals Group     BP 94/63     Systolic BP Percentile      Diastolic BP Percentile      Pulse Rate 98     Resp 16     Temp 97.9 F (36.6 C)     Temp Source Oral     SpO2      Weight 160 lb 0.9 oz (72.6 kg)     Height 5\' 4"  (1.626 m)     Head Circumference      Peak Flow      Pain Score 9     Pain Loc      Pain Education      Exclude from Growth Chart     Most recent vital signs: Vitals:   08/01/23 1245 08/01/23 1300  BP: 111/65 (!) 112/55  Pulse:    Resp: 14 15  Temp:    SpO2:      Constitutional: Alert and oriented. Eyes: Conjunctivae are normal. Head: Atraumatic. Nose: No congestion/rhinnorhea. Mouth/Throat: Mucous membranes are moist.  Cardiovascular: Normal rate, regular rhythm. Grossly normal heart sounds.  2+ radial pulses bilaterally. Respiratory: Normal respiratory effort.  No retractions.  Lungs CTAB. Gastrointestinal: Soft and nontender.  CVA tenderness noted bilaterally.  No distention. Musculoskeletal: No lower extremity tenderness nor edema.  Neurologic:  Normal speech and language. No gross focal neurologic deficits are appreciated.    ED Results / Procedures / Treatments   Labs (all labs ordered are listed, but only abnormal results are displayed) Labs Reviewed  COMPREHENSIVE METABOLIC PANEL - Abnormal; Notable for the following components:      Result Value   Sodium 132 (*)    CO2 13 (*)    BUN 85 (*)    Creatinine, Ser 5.34 (*)    Calcium 8.5 (*)    Albumin 3.1 (*)    AST 47 (*)    GFR, Estimated 8 (*)    Anion gap 18 (*)    All other components within normal limits  CBC WITH DIFFERENTIAL/PLATELET - Abnormal; Notable for the following components:   WBC 10.8 (*)    RBC 3.73 (*)    Hemoglobin 10.3 (*)    HCT 30.9 (*)    Neutro Abs 9.0 (*)    All other components within normal limits  PROTIME-INR - Abnormal; Notable for the following components:   Prothrombin Time 32.4 (*)  INR 3.1 (*)    All other components within normal limits  URINALYSIS, W/ REFLEX TO CULTURE (INFECTION SUSPECTED) - Abnormal; Notable for the following components:   Color, Urine YELLOW (*)    APPearance CLOUDY (*)    Hgb urine dipstick LARGE (*)    Leukocytes,Ua LARGE (*)    Bacteria, UA RARE (*)    Non Squamous Epithelial PRESENT (*)    All other components within normal limits  CULTURE, BLOOD (ROUTINE X 2)  CULTURE, BLOOD (ROUTINE X 2)  URINE CULTURE  LACTIC ACID, PLASMA   RADIOLOGY CT renal protocol reviewed and interpreted by me with right middle lobe pneumonia, no hydronephrosis or kidney stone noted.  PROCEDURES:  Critical Care performed: No  Procedures   MEDICATIONS ORDERED IN ED: Medications  azithromycin (ZITHROMAX) 500 mg in sodium chloride 0.9 % 250 mL IVPB (has no administration in time range)  lactated ringers bolus 1,000 mL ( Intravenous Restarted  08/01/23 1159)  piperacillin-tazobactam (ZOSYN) IVPB 3.375 g (0 g Intravenous Stopped 08/01/23 1139)  sodium chloride 0.9 % bolus 1,000 mL (0 mLs Intravenous Stopped 08/01/23 1239)    And  sodium chloride 0.9 % bolus 250 mL (0 mLs Intravenous Stopped 08/01/23 1239)     IMPRESSION / MDM / ASSESSMENT AND PLAN / ED COURSE  I reviewed the triage vital signs and the nursing notes.                              72 y.o. female with a past medical history of hypertension, hyperlipidemia, atrial fibrillation on Eliquis, CKD, and CHF who presents to the ED complaining of increasing pain in both sides of her back and flank area over the past 2 days, after she recently started antibiotics for a UTI.  Patient's presentation is most consistent with acute presentation with potential threat to life or bodily function.  Differential diagnosis includes, but is not limited to, pyelonephritis, kidney stone, cystitis, sepsis, electrolyte abnormality, AKI, anemia.  Patient nontoxic-appearing and in no acute distress, vital signs remarkable for borderline hypotension but otherwise reassuring.  Sepsis workup initiated from triage, patient does have bilateral CVA tenderness concerning for developing pyelonephritis.  Given she has not improved on Omnicef, we will further assess with CT imaging for renal stone or other contributing process, broaden antibiotic to IV Zosyn.  Unfortunately, urine culture does not appear to have been sent following her visit 2 days ago.  Labs do show significant AKI and acidosis, but patient states she continues to make urine normally.  No associated electrolyte abnormality, LFTs are unremarkable.  She has a mild leukocytosis but no significant anemia noted, lactic acid within normal limits.  CT imaging shows right middle lobe pneumonia, no evidence of kidney stone or other obstructive uropathy.  Urinalysis does appear concerning for UTI and urine was sent for culture.  We will add azithromycin in  addition to Zosyn, blood pressure much improved following IV fluids.  Case discussed with hospitalist for admission.      FINAL CLINICAL IMPRESSION(S) / ED DIAGNOSES   Final diagnoses:  Sepsis with acute renal failure and septic shock, due to unspecified organism, unspecified acute renal failure type (HCC)  Acute pyelonephritis  Pneumonia due to infectious organism, unspecified laterality, unspecified part of lung     Rx / DC Orders   ED Discharge Orders     None        Note:  This document was prepared using  [employer] and may include unintentional dictation errors.   Chesley Noon, MD 08/01/23 1322

## 2023-08-02 ENCOUNTER — Inpatient Hospital Stay: Payer: Medicare HMO

## 2023-08-02 DIAGNOSIS — N170 Acute kidney failure with tubular necrosis: Secondary | ICD-10-CM | POA: Diagnosis not present

## 2023-08-02 DIAGNOSIS — R6520 Severe sepsis without septic shock: Secondary | ICD-10-CM | POA: Diagnosis not present

## 2023-08-02 DIAGNOSIS — A419 Sepsis, unspecified organism: Secondary | ICD-10-CM | POA: Diagnosis not present

## 2023-08-02 LAB — T4, FREE: Free T4: 0.74 ng/dL (ref 0.61–1.12)

## 2023-08-02 LAB — CBC WITH DIFFERENTIAL/PLATELET
Abs Immature Granulocytes: 0.04 10*3/uL (ref 0.00–0.07)
Basophils Absolute: 0 10*3/uL (ref 0.0–0.1)
Basophils Relative: 1 %
Eosinophils Absolute: 0.1 10*3/uL (ref 0.0–0.5)
Eosinophils Relative: 1 %
HCT: 27.7 % — ABNORMAL LOW (ref 36.0–46.0)
Hemoglobin: 9.2 g/dL — ABNORMAL LOW (ref 12.0–15.0)
Immature Granulocytes: 1 %
Lymphocytes Relative: 12 %
Lymphs Abs: 0.9 10*3/uL (ref 0.7–4.0)
MCH: 28.1 pg (ref 26.0–34.0)
MCHC: 33.2 g/dL (ref 30.0–36.0)
MCV: 84.7 fL (ref 80.0–100.0)
Monocytes Absolute: 0.8 10*3/uL (ref 0.1–1.0)
Monocytes Relative: 10 %
Neutro Abs: 5.6 10*3/uL (ref 1.7–7.7)
Neutrophils Relative %: 75 %
Platelets: 271 10*3/uL (ref 150–400)
RBC: 3.27 MIL/uL — ABNORMAL LOW (ref 3.87–5.11)
RDW: 15.1 % (ref 11.5–15.5)
WBC: 7.5 10*3/uL (ref 4.0–10.5)
nRBC: 0 % (ref 0.0–0.2)

## 2023-08-02 LAB — BASIC METABOLIC PANEL
Anion gap: 10 (ref 5–15)
BUN: 59 mg/dL — ABNORMAL HIGH (ref 8–23)
CO2: 14 mmol/L — ABNORMAL LOW (ref 22–32)
Calcium: 7.4 mg/dL — ABNORMAL LOW (ref 8.9–10.3)
Chloride: 113 mmol/L — ABNORMAL HIGH (ref 98–111)
Creatinine, Ser: 3.22 mg/dL — ABNORMAL HIGH (ref 0.44–1.00)
GFR, Estimated: 15 mL/min — ABNORMAL LOW (ref >60)
Glucose, Bld: 67 mg/dL — ABNORMAL LOW (ref 70–99)
Potassium: 3.7 mmol/L (ref 3.5–5.1)
Sodium: 137 mmol/L (ref 135–145)

## 2023-08-02 LAB — URINE CULTURE: Culture: NO GROWTH

## 2023-08-02 MED ORDER — LEVOTHYROXINE SODIUM 88 MCG PO TABS
88.0000 ug | ORAL_TABLET | Freq: Every day | ORAL | Status: DC
  Administered 2023-08-02 – 2023-08-04 (×3): 88 ug via ORAL
  Filled 2023-08-02 (×3): qty 1

## 2023-08-02 MED ORDER — MIDODRINE HCL 5 MG PO TABS
5.0000 mg | ORAL_TABLET | Freq: Three times a day (TID) | ORAL | Status: DC
  Administered 2023-08-02 – 2023-08-03 (×4): 5 mg via ORAL
  Filled 2023-08-02 (×4): qty 1

## 2023-08-02 NOTE — Plan of Care (Signed)
  Problem: Activity: Goal: Ability to tolerate increased activity will improve Outcome: Progressing   Problem: Pain Managment: Goal: General experience of comfort will improve Outcome: Progressing   Problem: Safety: Goal: Ability to remain free from injury will improve Outcome: Progressing   Problem: Skin Integrity: Goal: Risk for impaired skin integrity will decrease Outcome: Progressing   

## 2023-08-02 NOTE — Plan of Care (Signed)
?  Problem: Activity: ?Goal: Ability to tolerate increased activity will improve ?Outcome: Progressing ?  ?Problem: Clinical Measurements: ?Goal: Ability to maintain a body temperature in the normal range will improve ?Outcome: Progressing ?  ?Problem: Respiratory: ?Goal: Ability to maintain a clear airway will improve ?Outcome: Progressing ?  ?

## 2023-08-02 NOTE — Progress Notes (Signed)
Mobility Specialist - Progress Note   08/02/23 0947  Mobility  Activity Ambulated with assistance in room;Ambulated with assistance to bathroom;Stood at bedside;Dangled on edge of bed  Level of Assistance Standby assist, set-up cues, supervision of patient - no hands on  Assistive Device Front wheel walker  Distance Ambulated (ft) 15 ft  Activity Response Tolerated well  Mobility Referral Yes  $Mobility charge 1 Mobility  Mobility Specialist Start Time (ACUTE ONLY) [id number]  Mobility Specialist Stop Time (ACUTE ONLY) 0941  Mobility Specialist Time Calculation (min) (ACUTE ONLY) 23 min   Pt supine in bed on RA upon arrival. Pt STS and ambulates to/from bathroom SBA with no LOB noted. Pt endorses low back pain, RN notified. Pt returns to bed with needs in reach.  During mobility: BP = 95/64 SpO2 = 86  Post-mobility: BP = 95/64 SPO2 = 89   [facility]  Mobility Specialist  08/02/23 9:49 AM

## 2023-08-02 NOTE — Progress Notes (Signed)
[facility]  ROUNDING NOTE   Subjective:   Desiree Madden is a 72 y.o. female with hypertension, coronary artery disease status post CABG, atrial fibrillation, diverticulosis, and chronic kidney disease stage 3b. Patient presents to the ED with back pain, dizziness and burning with urination. She has been admitted for Acute pyelonephritis [N10] Sepsis (HCC) [A41.9] Pneumonia due to infectious organism, unspecified laterality, unspecified part of lung [J18.9] Sepsis with acute renal failure and septic shock, due to unspecified organism, unspecified acute renal failure type (HCC) [A41.9, R65.21, N17.9]   Patient is known to our office and is followed by Dr Cherylann Ratel. He was last seen in office on 04/22/23 for routine follow up. For the past 2 days, she has been treating a UTI outpatient. She states has been experiencing these symptoms for 2-3 weeks. Felt like it was due to heat. Was diagnosed with UTI but states she was not feeling better with antibiotics. Poor oral intake for a few days. Denies nausea or vomiting.   Labs on Ed arrival significant for sodium 132, s bicarb 13, creatinine 5.34 with GFR 8, and BUN 85. Hgb 10.3. TSH greater than 48000, UA remains cloudy with leukocytes and hematuria. CT renal stone shows right middle lobe consolidation and no hydronephrosis.   We have been consulted to evaluate acute kidney injury.   Objective:  Vital signs in last 24 hours:  Temp:  [97.7 F (36.5 C)-98.4 F (36.9 C)] 98.4 F (36.9 C) (08/24 0435) Pulse Rate:  [71-100] 73 (08/24 0435) Resp:  [14-20] 18 (08/24 0435) BP: (80-112)/(46-70) 84/49 (08/24 0435) SpO2:  [96 %-100 %] 96 % (08/24 0435)  Weight change:  Filed Weights   08/01/23 0930  Weight: 72.6 kg    Intake/Output: I/O last 3 completed shifts: In: 515.6 [I.V.:415.6; IV Piggyback:100] Out: -    Intake/Output this shift:  No intake/output data recorded.  Physical Exam: General: NAD  Head: Normocephalic,  atraumatic. Moist oral mucosal membranes  Eyes: Anicteric  Neck: Supple, trachea midline  Lungs:  Clear to auscultation, normal effort, room air  Heart: Regular rate and rhythm  Abdomen:  Soft, nontender, urostomy  Extremities:  trace peripheral edema.  Neurologic: Alert and oriented, moving all four extremities  Skin: No lesions  Access: None    Basic Metabolic Panel: Recent Labs  Lab 08/01/23 0942 08/02/23 0832  NA 132* 137  K 4.3 3.7  CL 101 113*  CO2 13* 14*  GLUCOSE 91 67*  BUN 85* 59*  CREATININE 5.34* 3.22*  CALCIUM 8.5* 7.4*    Liver Function Tests: Recent Labs  Lab 08/01/23 0942  AST 47*  ALT 19  ALKPHOS 81  BILITOT 0.6  PROT 7.6  ALBUMIN 3.1*   No results for input(s): "LIPASE", "AMYLASE" in the last 168 hours. No results for input(s): "AMMONIA" in the last 168 hours.  CBC: Recent Labs  Lab 08/01/23 0942 08/02/23 0832  WBC 10.8* 7.5  NEUTROABS 9.0* 5.6  HGB 10.3* 9.2*  HCT 30.9* 27.7*  MCV 82.8 84.7  PLT 378 271    Cardiac Enzymes: No results for input(s): "CKTOTAL", "CKMB", "CKMBINDEX", "TROPONINI" in the last 168 hours.  BNP: Invalid input(s): "POCBNP"  CBG: Recent Labs  Lab 08/01/23 2042  GLUCAP 76    Microbiology: Results for orders placed or performed during the hospital encounter of 08/01/23  Culture, blood (Routine x 2)     Status: None (Preliminary result)   Collection Time: 08/01/23  9:42 AM   Specimen: BLOOD  Result Value Ref  Range Status   Specimen Description BLOOD BLOOD LEFT ARM  Final   Special Requests   Final    BOTTLES DRAWN AEROBIC AND ANAEROBIC Blood Culture adequate volume   Culture   Final    NO GROWTH < 24 HOURS Performed at [facility], [address] [place], [place] [address]    Report Status PENDING  Incomplete  Culture, blood (Routine x 2)     Status: None (Preliminary result)   Collection Time: 08/01/23 10:52 AM   Specimen: BLOOD  Result Value Ref Range Status   Specimen Description  BLOOD BLOOD LEFT ARM  Final   Special Requests   Final    BOTTLES DRAWN AEROBIC AND ANAEROBIC Blood Culture adequate volume   Culture   Final    NO GROWTH < 24 HOURS Performed at [facility], [address], [place], [place] [address]    Report Status PENDING  Incomplete    Coagulation Studies: Recent Labs    08/01/23 0942  LABPROT 32.4*  INR 3.1*    Urinalysis: Recent Labs    08/01/23 1033  COLORURINE YELLOW*  LABSPEC 1.006  PHURINE 6.0  GLUCOSEU NEGATIVE  HGBUR LARGE*  BILIRUBINUR NEGATIVE  KETONESUR NEGATIVE  PROTEINUR NEGATIVE  NITRITE NEGATIVE  LEUKOCYTESUR LARGE*      Imaging: DG Chest 1 View  Result Date: 08/02/2023 CLINICAL DATA:  CHF EXAM: CHEST  1 VIEW COMPARISON:  03/20/2021 FINDINGS: Indistinct density at the right middle lobe by prior CT, no detected change. No Kerley lines, effusion, or pneumothorax. Normal heart size and mediastinal contours. Prior CABG. IMPRESSION: Right middle lobe infiltrate as seen on abdominal CT yesterday. No new abnormality or pulmonary edema. Electronically Signed   By: Tiburcio Pea M.D.   On: 08/02/2023 06:53   CT Renal Stone Study  Result Date: 08/01/2023 CLINICAL DATA:  Back pain and dysuria. Currently being treated for urinary tract infection EXAM: CT ABDOMEN AND PELVIS WITHOUT CONTRAST TECHNIQUE: Multidetector CT imaging of the abdomen and pelvis was performed following the standard protocol without IV contrast. RADIATION DOSE REDUCTION: This exam was performed according to the departmental dose-optimization program which includes automated exposure control, adjustment of the mA and/or kV according to patient size and/or use of iterative reconstruction technique. COMPARISON:  CT abdomen and pelvis dated 02/28/2021, MR thoracic spine dated 02/22/2022 FINDINGS: Lower chest: Right middle lobe consolidation with air bronchograms. No pleural effusion or pneumothorax demonstrated. Partially imaged heart size is normal.  Coronary artery calcifications. Hepatobiliary: No focal hepatic lesions. No intra or extrahepatic biliary ductal dilation. Normal gallbladder. Pancreas: No focal lesions or main ductal dilation. Spleen: Normal in size without focal abnormality. Adrenals/Urinary Tract: No adrenal nodules. No suspicious renal mass by noncontrast technique, calculi or hydronephrosis. No focal bladder wall thickening. Stomach/Bowel: Normal appearance of the stomach. Left lower quadrant colostomy. Parastomal hernia contains fat and a short loop of transverse colon. No evidence of bowel wall thickening, distention, or inflammatory changes. Excluded rectum demonstrates diverticulosis. Normal appendix. Vascular/Lymphatic: Aortic atherosclerosis. No enlarged abdominal or pelvic lymph nodes. Reproductive: No adnexal masses. Other: No free fluid, fluid collection, or free air. Musculoskeletal: Multilevel degenerative changes of the partially imaged thoracic and lumbar spine. Interval new superior endplate compression of T10 since 02/22/2022. Unchanged compression of T11 with interval vertebral augmentation as well as partially imaged T8. IMPRESSION: 1. Right middle lobe consolidation with air bronchograms, suspicious for pneumonia. 2. Interval new superior endplate compression of T10 since 02/22/2022. Unchanged compression deformity of T11 with interval vertebral  augmentation as well as at partially imaged T8. 3. Left lower quadrant colostomy with parastomal hernia containing fat and a short loop of transverse colon. No evidence of bowel obstruction. 4. No hydronephrosis. 5. Aortic Atherosclerosis (ICD10-I70.0). Coronary artery calcifications. Assessment for potential risk factor modification, dietary therapy or pharmacologic therapy may be warranted, if clinically indicated. Electronically Signed   By: Agustin Cree M.D.   On: 08/01/2023 13:12     Medications:    cefTRIAXone (ROCEPHIN)  IV 2 g (08/01/23 1828)   sodium bicarbonate 75 mEq in  sodium chloride 0.45 % 1,075 mL infusion 125 mL/hr at 08/02/23 1044   sodium chloride      apixaban  5 mg Oral BID   aspirin EC  81 mg Oral Daily   atorvastatin  20 mg Oral Daily   azithromycin  500 mg Oral Daily   escitalopram  5 mg Oral Daily   fluticasone  2 spray Each Nare Daily   guaiFENesin  600 mg Oral BID   levothyroxine  88 mcg Oral Q0600   loratadine  10 mg Oral Daily   midodrine  5 mg Oral TID WC   mirtazapine  15 mg Oral QHS   pantoprazole  40 mg Oral Daily   sodium bicarbonate  650 mg Oral Daily   sodium chloride flush  10-40 mL Intracatheter Q12H   tamsulosin  0.4 mg Oral QPC supper   acetaminophen, albuterol, ALPRAZolam, hydrALAZINE, methocarbamol, ondansetron, oxyCODONE, sodium chloride flush  Assessment/ Plan:  Ms. Desiree Madden is a 72 y.o.  female ith hypertension, coronary artery disease status post CABG, atrial fibrillation, diverticulosis, and chronic kidney disease stage 3b. Patient presents to the ED with back pain, dizziness and burning with urination. She has been admitted for Acute pyelonephritis [N10] Sepsis (HCC) [A41.9] Pneumonia due to infectious organism, unspecified laterality, unspecified part of lung [J18.9] Sepsis with acute renal failure and septic shock, due to unspecified organism, unspecified acute renal failure type (HCC) [A41.9, R65.21, N17.9]   Acute Kidney Injury on chronic kidney disease stage IIIb with baseline creatinine 1.7 and GFR of 32 on 04/04/23.  Acute kidney injury appears prerenal from dehydration. Urostomy bag changed yesterday, remains dry. UA positive for UTI also.  Chronic kidney disease is secondary to Hypertension. CT renal stone negative for hydronephrosis. Agree with IVF for gentle hydration. Continue antibiotics for UTI.  Creatinine has already responded well to IV hydration. No acute need for dialysis. Avoid nephrotoxic agents and hypotension. Will continue to monitor.   Lab Results  Component Value Date   CREATININE  3.22 (H) 08/02/2023   CREATININE 5.34 (H) 08/01/2023   CREATININE 1.37 (H) 04/16/2021    Intake/Output Summary (Last 24 hours) at 08/02/2023 1059 Last data filed at 08/02/2023 0300 Gross per 24 hour  Intake 515.64 ml  Output --  Net 515.64 ml   2. Acute metabolic acidosis, S bicarb 13. Continue sodium bicarb infusion.   3. Anemia of chronic kidney disease Lab Results  Component Value Date   HGB 9.2 (L) 08/02/2023    Hgb below desired range. Will monitor for now.   4. Hypertension with Chronic kidney disease. Home regimen includes lisinopril and metoprolol. Lisinopril held due to kidney injury.     LOS: 1   8/24/202410:59 AM

## 2023-08-02 NOTE — Progress Notes (Addendum)
PROGRESS NOTE    Desiree Madden  ZOX:[id number] DOB: 1951-11-28 DOA: 08/01/2023 PCP: [facility]    Brief Narrative:  72 y.o. female with medical history significant of CKD stage IV, HTN, hypothyroidism, PAF on Eliquis, CAD/MI with CABG in 2017, chronic combined HFrEF (LVEF 45% in 2022) and HFpEF, spinal stenosis chronic back pain and chronic narcotic dependence, partial colectomy and chronic colostomy bag, presented with worsening of lower abdominal pain dysuria worsening of back pain and cough.   Patient started to have a new dry cough about 2 to 3 weeks ago " due to the heat" denies any sputum production but does have night sweat.  2 weeks ago, patient started to have dysuria burning sensation and lower abdominal pain.  She went to see PCP 2 days ago was diagnosed with UTI and she started to take antibiotics of Omnicef yesterday.  She has a CKD stage IV, and 2 days ago when she visited her PCP it was found her blood pressure in the 70s.  Patient reported that she continued to take blood pressure medications this morning, and she does admit that in the last few days she has felt very dizzy and meantime she lost her appetite and did not eat or drink much for the last 3 to 4 days and became very dehydrated.   Assessment & Plan:   Principal Problem:   Sepsis (HCC) Active Problems:   AKI (acute kidney injury) (HCC)   PNA (pneumonia)   Sepsis secondary to UTI (HCC)  Severe sepsis -Evidenced by persistently low blood pressure, leukocytosis, and signs of endorgan damage of AKI, source of infection considered to be a UTI plus right middle lobe pneumonia -Received 4.25 L bolus in the ED  Plan: Continue midodrine, wean as tolerated Continue ceftriaxone for UTI Continue IV fluids Daily renal function  CAP, bacterial -Antibiotics as above -Breathing treatment -Incentive spirometry and flutter valve -Repeat CT chest in 4 weeks to document resolution of the pneumonia   UTI -As  above   AKI on CKD stage IV With decompensated non-anion gap metabolic acidosis With worsening of uremia Plan: Suspect ATN versus prerenal azotemia Nephrology consulted Midodrine as above IV fluids Daily renal function   Chronic HFrEF and HFpEF HTN -Severe volume depletion and sepsis, requiring fluid resuscitation -Monitor BP and volume status, and I&Os -Chest x-ray tomorrow to help determine volume status -Hold off home BP meds, start as needed hydralazine   PAF -No acute concern, continue Eliquis   Anxiety/depression -Continue Remeron, prn Xanax - Cymbalta and lexapro removed from active MAR as patient not actively taking   Hypothyroidism -Continue Synthroid   Colostomy, chronic -Patient reports no significant increase of ostomy output/diarrhea  DVT prophylaxis: Eliquis Code Status: DNR Family Communication:Daughter Shanda Bumps [phone] on 8/24 Disposition Plan: Status is: Inpatient Remains inpatient appropriate because: UTI/severe sepsis   Level of care: Progressive  Consultants:  Nephrology  Procedures:  None  Antimicrobials: Ceftriaxone Azithromycin   Subjective: Seen and examined.  Resting in bed.  Reports interval improvement in symptoms since admission.  No pain complaints.  Objective: Vitals:   08/01/23 2328 08/01/23 2332 08/02/23 0435 08/02/23 1218  BP: (!) 89/58 (!) 89/58 (!) 84/49 121/65  Pulse: 88 71 73 88  Resp: 18 18 18 20   Temp: 98 F (36.7 C) 98 F (36.7 C) 98.4 F (36.9 C) (!) 97.4 F (36.3 C)  TempSrc:      SpO2: 100% 99% 96% 96%  Weight:      Height:  Intake/Output Summary (Last 24 hours) at 08/02/2023 1305 Last data filed at 08/02/2023 0300 Gross per 24 hour  Intake 515.64 ml  Output --  Net 515.64 ml   Filed Weights   08/01/23 0930  Weight: 72.6 kg    Examination:  General exam: NAD Respiratory system: Lungs clear.  Normal work of breathing.  Room air Cardiovascular system: S1-2, RRR, no murmurs, no  pedal edema Gastrointestinal system: NT/ND, normal bowel sounds Central nervous system: Alert and oriented. No focal neurological deficits. Extremities: Symmetric 5 x 5 power. Skin: No rashes, lesions or ulcers Psychiatry: Judgement and insight appear normal. Mood & affect appropriate.     Data Reviewed: I have personally reviewed following labs and imaging studies  CBC: Recent Labs  Lab 08/01/23 0942 08/02/23 0832  WBC 10.8* 7.5  NEUTROABS 9.0* 5.6  HGB 10.3* 9.2*  HCT 30.9* 27.7*  MCV 82.8 84.7  PLT 378 271   Basic Metabolic Panel: Recent Labs  Lab 08/01/23 0942 08/02/23 0832  NA 132* 137  K 4.3 3.7  CL 101 113*  CO2 13* 14*  GLUCOSE 91 67*  BUN 85* 59*  CREATININE 5.34* 3.22*  CALCIUM 8.5* 7.4*   GFR: Estimated Creatinine Clearance: 15.4 mL/min (A) (by C-G formula based on SCr of 3.22 mg/dL (H)). Liver Function Tests: Recent Labs  Lab 08/01/23 0942  AST 47*  ALT 19  ALKPHOS 81  BILITOT 0.6  PROT 7.6  ALBUMIN 3.1*   No results for input(s): "LIPASE", "AMYLASE" in the last 168 hours. No results for input(s): "AMMONIA" in the last 168 hours. Coagulation Profile: Recent Labs  Lab 08/01/23 0942  INR 3.1*   Cardiac Enzymes: No results for input(s): "CKTOTAL", "CKMB", "CKMBINDEX", "TROPONINI" in the last 168 hours. BNP (last 3 results) No results for input(s): "PROBNP" in the last 8760 hours. HbA1C: No results for input(s): "HGBA1C" in the last 72 hours. CBG: Recent Labs  Lab 08/01/23 2042  GLUCAP 76   Lipid Profile: No results for input(s): "CHOL", "HDL", "LDLCALC", "TRIG", "CHOLHDL", "LDLDIRECT" in the last 72 hours. Thyroid Function Tests: Recent Labs    08/01/23 0942  TSH 48.563*   Anemia Panel: No results for input(s): "VITAMINB12", "FOLATE", "FERRITIN", "TIBC", "IRON", "RETICCTPCT" in the last 72 hours. Sepsis Labs: Recent Labs  Lab 08/01/23 6606  LATICACIDVEN 1.5    Recent Results (from the past 240 hour(s))  Culture, blood  (Routine x 2)     Status: None (Preliminary result)   Collection Time: 08/01/23  9:42 AM   Specimen: BLOOD  Result Value Ref Range Status   Specimen Description BLOOD BLOOD LEFT ARM  Final   Special Requests   Final    BOTTLES DRAWN AEROBIC AND ANAEROBIC Blood Culture adequate volume   Culture   Final    NO GROWTH < 24 HOURS Performed at [facility], [address] [place], [place] [address]    Report Status PENDING  Incomplete  Culture, blood (Routine x 2)     Status: None (Preliminary result)   Collection Time: 08/01/23 10:52 AM   Specimen: BLOOD  Result Value Ref Range Status   Specimen Description BLOOD BLOOD LEFT ARM  Final   Special Requests   Final    BOTTLES DRAWN AEROBIC AND ANAEROBIC Blood Culture adequate volume   Culture   Final    NO GROWTH < 24 HOURS Performed at [facility], [address], [place], [place] [address]    Report Status PENDING  Incomplete  Radiology Studies: DG Chest 1 View  Result Date: 08/02/2023 CLINICAL DATA:  CHF EXAM: CHEST  1 VIEW COMPARISON:  03/20/2021 FINDINGS: Indistinct density at the right middle lobe by prior CT, no detected change. No Kerley lines, effusion, or pneumothorax. Normal heart size and mediastinal contours. Prior CABG. IMPRESSION: Right middle lobe infiltrate as seen on abdominal CT yesterday. No new abnormality or pulmonary edema. Electronically Signed   By: Tiburcio Pea M.D.   On: 08/02/2023 06:53   CT Renal Stone Study  Result Date: 08/01/2023 CLINICAL DATA:  Back pain and dysuria. Currently being treated for urinary tract infection EXAM: CT ABDOMEN AND PELVIS WITHOUT CONTRAST TECHNIQUE: Multidetector CT imaging of the abdomen and pelvis was performed following the standard protocol without IV contrast. RADIATION DOSE REDUCTION: This exam was performed according to the departmental dose-optimization program which includes automated exposure control, adjustment of the mA and/or kV according  to patient size and/or use of iterative reconstruction technique. COMPARISON:  CT abdomen and pelvis dated 02/28/2021, MR thoracic spine dated 02/22/2022 FINDINGS: Lower chest: Right middle lobe consolidation with air bronchograms. No pleural effusion or pneumothorax demonstrated. Partially imaged heart size is normal. Coronary artery calcifications. Hepatobiliary: No focal hepatic lesions. No intra or extrahepatic biliary ductal dilation. Normal gallbladder. Pancreas: No focal lesions or main ductal dilation. Spleen: Normal in size without focal abnormality. Adrenals/Urinary Tract: No adrenal nodules. No suspicious renal mass by noncontrast technique, calculi or hydronephrosis. No focal bladder wall thickening. Stomach/Bowel: Normal appearance of the stomach. Left lower quadrant colostomy. Parastomal hernia contains fat and a short loop of transverse colon. No evidence of bowel wall thickening, distention, or inflammatory changes. Excluded rectum demonstrates diverticulosis. Normal appendix. Vascular/Lymphatic: Aortic atherosclerosis. No enlarged abdominal or pelvic lymph nodes. Reproductive: No adnexal masses. Other: No free fluid, fluid collection, or free air. Musculoskeletal: Multilevel degenerative changes of the partially imaged thoracic and lumbar spine. Interval new superior endplate compression of T10 since 02/22/2022. Unchanged compression of T11 with interval vertebral augmentation as well as partially imaged T8. IMPRESSION: 1. Right middle lobe consolidation with air bronchograms, suspicious for pneumonia. 2. Interval new superior endplate compression of T10 since 02/22/2022. Unchanged compression deformity of T11 with interval vertebral augmentation as well as at partially imaged T8. 3. Left lower quadrant colostomy with parastomal hernia containing fat and a short loop of transverse colon. No evidence of bowel obstruction. 4. No hydronephrosis. 5. Aortic Atherosclerosis (ICD10-I70.0). Coronary artery  calcifications. Assessment for potential risk factor modification, dietary therapy or pharmacologic therapy may be warranted, if clinically indicated. Electronically Signed   By: Agustin Cree M.D.   On: 08/01/2023 13:12        Scheduled Meds:  apixaban  5 mg Oral BID   aspirin EC  81 mg Oral Daily   atorvastatin  20 mg Oral Daily   azithromycin  500 mg Oral Daily   escitalopram  5 mg Oral Daily   fluticasone  2 spray Each Nare Daily   guaiFENesin  600 mg Oral BID   levothyroxine  88 mcg Oral Q0600   loratadine  10 mg Oral Daily   midodrine  5 mg Oral TID WC   mirtazapine  15 mg Oral QHS   pantoprazole  40 mg Oral Daily   sodium bicarbonate  650 mg Oral Daily   sodium chloride flush  10-40 mL Intracatheter Q12H   tamsulosin  0.4 mg Oral QPC supper   Continuous Infusions:  cefTRIAXone (ROCEPHIN)  IV 2 g (08/01/23 1828)   sodium  bicarbonate 75 mEq in sodium chloride 0.45 % 1,075 mL infusion 125 mL/hr at 08/02/23 1044   sodium chloride       LOS: 1 day      Tresa Moore, MD [facility]   If 7PM-7AM, please contact night-coverage  08/02/2023, 1:05 PM

## 2023-08-03 DIAGNOSIS — N170 Acute kidney failure with tubular necrosis: Secondary | ICD-10-CM | POA: Diagnosis not present

## 2023-08-03 DIAGNOSIS — A419 Sepsis, unspecified organism: Secondary | ICD-10-CM | POA: Diagnosis not present

## 2023-08-03 DIAGNOSIS — R6520 Severe sepsis without septic shock: Secondary | ICD-10-CM | POA: Diagnosis not present

## 2023-08-03 LAB — CBC WITH DIFFERENTIAL/PLATELET
Abs Immature Granulocytes: 0.06 10*3/uL (ref 0.00–0.07)
Basophils Absolute: 0 10*3/uL (ref 0.0–0.1)
Basophils Relative: 1 %
Eosinophils Absolute: 0.1 10*3/uL (ref 0.0–0.5)
Eosinophils Relative: 1 %
HCT: 26.4 % — ABNORMAL LOW (ref 36.0–46.0)
Hemoglobin: 9.1 g/dL — ABNORMAL LOW (ref 12.0–15.0)
Immature Granulocytes: 1 %
Lymphocytes Relative: 23 %
Lymphs Abs: 1.9 10*3/uL (ref 0.7–4.0)
MCH: 28 pg (ref 26.0–34.0)
MCHC: 34.5 g/dL (ref 30.0–36.0)
MCV: 81.2 fL (ref 80.0–100.0)
Monocytes Absolute: 0.8 10*3/uL (ref 0.1–1.0)
Monocytes Relative: 10 %
Neutro Abs: 5.3 10*3/uL (ref 1.7–7.7)
Neutrophils Relative %: 64 %
Platelets: 299 10*3/uL (ref 150–400)
RBC: 3.25 MIL/uL — ABNORMAL LOW (ref 3.87–5.11)
RDW: 15 % (ref 11.5–15.5)
WBC: 8.1 10*3/uL (ref 4.0–10.5)
nRBC: 0 % (ref 0.0–0.2)

## 2023-08-03 LAB — BASIC METABOLIC PANEL
Anion gap: 8 (ref 5–15)
BUN: 39 mg/dL — ABNORMAL HIGH (ref 8–23)
CO2: 22 mmol/L (ref 22–32)
Calcium: 6.9 mg/dL — ABNORMAL LOW (ref 8.9–10.3)
Chloride: 108 mmol/L (ref 98–111)
Creatinine, Ser: 1.89 mg/dL — ABNORMAL HIGH (ref 0.44–1.00)
GFR, Estimated: 28 mL/min — ABNORMAL LOW (ref >60)
Glucose, Bld: 85 mg/dL (ref 70–99)
Potassium: 3.2 mmol/L — ABNORMAL LOW (ref 3.5–5.1)
Sodium: 138 mmol/L (ref 135–145)

## 2023-08-03 MED ORDER — POTASSIUM CHLORIDE CRYS ER 20 MEQ PO TBCR
40.0000 meq | EXTENDED_RELEASE_TABLET | Freq: Once | ORAL | Status: AC
  Administered 2023-08-03: 40 meq via ORAL
  Filled 2023-08-03: qty 2

## 2023-08-03 MED ORDER — MIDODRINE HCL 5 MG PO TABS
5.0000 mg | ORAL_TABLET | Freq: Two times a day (BID) | ORAL | Status: DC
  Administered 2023-08-03: 5 mg via ORAL
  Filled 2023-08-03: qty 1

## 2023-08-03 NOTE — Progress Notes (Signed)
Mobility Specialist - Progress Note   08/03/23 1042  Mobility  Activity Ambulated with assistance in hallway;Stood at bedside;Dangled on edge of bed  Level of Assistance Standby assist, set-up cues, supervision of patient - no hands on  Assistive Device Front wheel walker  Distance Ambulated (ft) 240 ft  Activity Response Tolerated well  Mobility Referral Yes  $Mobility charge 1 Mobility  Mobility Specialist Start Time (ACUTE ONLY) 1000  Mobility Specialist Stop Time (ACUTE ONLY) 1013  Mobility Specialist Time Calculation (min) (ACUTE ONLY) 13 min   Pt supine in bed on RA upon arrival. Pt STS and ambulates in hallway SBA with no LOB noted. Pt takes one standing rest break 223ft into ambulation. Pt returns to bed with needs in reach and bed alarm activated.   Terrilyn Saver  Mobility Specialist  08/03/23 10:44 AM

## 2023-08-03 NOTE — Progress Notes (Signed)
PROGRESS NOTE    Desiree Madden  QVZ:[id number] DOB: 03-Mar-1951 DOA: 08/01/2023 PCP: [facility]    Brief Narrative:  72 y.o. female with medical history significant of CKD stage IV, HTN, hypothyroidism, PAF on Eliquis, CAD/MI with CABG in 2017, chronic combined HFrEF (LVEF 45% in 2022) and HFpEF, spinal stenosis chronic back pain and chronic narcotic dependence, partial colectomy and chronic colostomy bag, presented with worsening of lower abdominal pain dysuria worsening of back pain and cough.   Patient started to have a new dry cough about 2 to 3 weeks ago " due to the heat" denies any sputum production but does have night sweat.  2 weeks ago, patient started to have dysuria burning sensation and lower abdominal pain.  She went to see PCP 2 days ago was diagnosed with UTI and she started to take antibiotics of Omnicef yesterday.  She has a CKD stage IV, and 2 days ago when she visited her PCP it was found her blood pressure in the 70s.  Patient reported that she continued to take blood pressure medications this morning, and she does admit that in the last few days she has felt very dizzy and meantime she lost her appetite and did not eat or drink much for the last 3 to 4 days and became very dehydrated.   Assessment & Plan:   Principal Problem:   Sepsis (HCC) Active Problems:   AKI (acute kidney injury) (HCC)   PNA (pneumonia)   Sepsis secondary to UTI (HCC)  Severe sepsis -Evidenced by persistently low blood pressure, leukocytosis, and signs of endorgan damage of AKI, source of infection considered to be a UTI plus right middle lobe pneumonia -Received 4.25 L bolus in the ED  -Interestingly urine culture showed no growth Plan: Continue midodrine.  Wean to 5 mg twice daily Continue ceftriaxone for presumed UTI, needs only 3 days but may need few days longer for concomitant community-acquired pneumonia Continue IV fluids Daily renal function  CAP, bacterial -Antibiotics  as above -Breathing treatment -Incentive spirometry and flutter valve -Repeat CT chest in 4 weeks to document resolution of the pneumonia   UTI -As above   AKI on CKD stage IV With decompensated non-anion gap metabolic acidosis With worsening of uremia Creatinine markedly improved Plan: Suspect ATN versus prerenal azotemia Nephrology consulted Midodrine as above, wean as tolerated IV fluids Daily renal function   Chronic HFrEF and HFpEF HTN -Severe volume depletion and sepsis, requiring fluid resuscitation -Monitor BP and volume status, and I&Os -Chest x-ray tomorrow to help determine volume status -Hold off home BP meds, start as needed hydralazine   PAF -No acute concern, continue Eliquis   Anxiety/depression -Continue Remeron, prn Xanax - Cymbalta and lexapro removed from active MAR as patient not actively taking   Hypothyroidism -Continue Synthroid   Colostomy, chronic -Patient reports no significant increase of ostomy output/diarrhea  DVT prophylaxis: Eliquis Code Status: DNR Family Communication:Daughter Shanda Bumps [phone] on 8/24 Disposition Plan: Status is: Inpatient Remains inpatient appropriate because: UTI/severe sepsis   Level of care: Progressive  Consultants:  Nephrology  Procedures:  None  Antimicrobials: Ceftriaxone Azithromycin   Subjective: Seen and examined.  Resting in bed.  Interval improvement in symptoms.  No pain complaints Objective: Vitals:   08/03/23 0801 08/03/23 1100 08/03/23 1102 08/03/23 1140  BP: 108/63 (!) 83/56 (!) 91/59 108/60  Pulse: 90 92 90   Resp: 16     Temp: 97.9 F (36.6 C) 97.8 F (36.6 C)    TempSrc:  Oral    SpO2: 100% 99% 99%   Weight:      Height:       No intake or output data in the 24 hours ending 08/03/23 1150  Filed Weights   08/01/23 0930  Weight: 72.6 kg    Examination:  General exam: No acute distress Respiratory system: Lungs clear.  Normal work of breathing.  Room  air Cardiovascular system: S1-2, RRR, no murmurs, no pedal edema Gastrointestinal system: NT/ND, normal bowel sounds Central nervous system: Alert and oriented. No focal neurological deficits. Extremities: Symmetric 5 x 5 power. Skin: No rashes, lesions or ulcers Psychiatry: Judgement and insight appear normal. Mood & affect appropriate.     Data Reviewed: I have personally reviewed following labs and imaging studies  CBC: Recent Labs  Lab 08/01/23 0942 08/02/23 0832 08/03/23 0600  WBC 10.8* 7.5 8.1  NEUTROABS 9.0* 5.6 5.3  HGB 10.3* 9.2* 9.1*  HCT 30.9* 27.7* 26.4*  MCV 82.8 84.7 81.2  PLT 378 271 299   Basic Metabolic Panel: Recent Labs  Lab 08/01/23 0942 08/02/23 0832 08/03/23 0600  NA 132* 137 138  K 4.3 3.7 3.2*  CL 101 113* 108  CO2 13* 14* 22  GLUCOSE 91 67* 85  BUN 85* 59* 39*  CREATININE 5.34* 3.22* 1.89*  CALCIUM 8.5* 7.4* 6.9*   GFR: Estimated Creatinine Clearance: 26.3 mL/min (A) (by C-G formula based on SCr of 1.89 mg/dL (H)). Liver Function Tests: Recent Labs  Lab 08/01/23 0942  AST 47*  ALT 19  ALKPHOS 81  BILITOT 0.6  PROT 7.6  ALBUMIN 3.1*   No results for input(s): "LIPASE", "AMYLASE" in the last 168 hours. No results for input(s): "AMMONIA" in the last 168 hours. Coagulation Profile: Recent Labs  Lab 08/01/23 0942  INR 3.1*   Cardiac Enzymes: No results for input(s): "CKTOTAL", "CKMB", "CKMBINDEX", "TROPONINI" in the last 168 hours. BNP (last 3 results) No results for input(s): "PROBNP" in the last 8760 hours. HbA1C: No results for input(s): "HGBA1C" in the last 72 hours. CBG: Recent Labs  Lab 08/01/23 2042  GLUCAP 76   Lipid Profile: No results for input(s): "CHOL", "HDL", "LDLCALC", "TRIG", "CHOLHDL", "LDLDIRECT" in the last 72 hours. Thyroid Function Tests: Recent Labs    08/01/23 0942 08/02/23 0832  TSH 48.563*  --   FREET4  --  0.74   Anemia Panel: No results for input(s): "VITAMINB12", "FOLATE", "FERRITIN",  "TIBC", "IRON", "RETICCTPCT" in the last 72 hours. Sepsis Labs: Recent Labs  Lab 08/01/23 1610  LATICACIDVEN 1.5    Recent Results (from the past 240 hour(s))  Culture, blood (Routine x 2)     Status: None (Preliminary result)   Collection Time: 08/01/23  9:42 AM   Specimen: BLOOD  Result Value Ref Range Status   Specimen Description BLOOD BLOOD LEFT ARM  Final   Special Requests   Final    BOTTLES DRAWN AEROBIC AND ANAEROBIC Blood Culture adequate volume   Culture   Final    NO GROWTH 2 DAYS Performed at [facility], [address] [place], [place] [address]    Report Status PENDING  Incomplete  Urine Culture     Status: None   Collection Time: 08/01/23 10:40 AM   Specimen: Urine, Clean Catch  Result Value Ref Range Status   Specimen Description   Final    URINE, CLEAN CATCH Performed at [facility], [address], [place], [place] [address]    Special Requests   Final  NONE Performed at [facility] [place], [address] [place], [place] [address]    Culture   Final    NO GROWTH Performed at [facility], [address] [place], [place] [address]    Report Status 08/02/2023 FINAL  Final  Culture, blood (Routine x 2)     Status: None (Preliminary result)   Collection Time: 08/01/23 10:52 AM   Specimen: BLOOD  Result Value Ref Range Status   Specimen Description BLOOD BLOOD LEFT ARM  Final   Special Requests   Final    BOTTLES DRAWN AEROBIC AND ANAEROBIC Blood Culture adequate volume   Culture   Final    NO GROWTH 2 DAYS Performed at [facility], [address], [place], [place] [address]    Report Status PENDING  Incomplete         Radiology Studies: DG Chest 1 View  Result Date: 08/02/2023 CLINICAL DATA:  CHF EXAM: CHEST  1 VIEW COMPARISON:  03/20/2021 FINDINGS: Indistinct density at the right middle lobe by prior CT, no detected change. No Kerley lines, effusion, or pneumothorax. Normal heart size and  mediastinal contours. Prior CABG. IMPRESSION: Right middle lobe infiltrate as seen on abdominal CT yesterday. No new abnormality or pulmonary edema. Electronically Signed   By: Tiburcio Pea M.D.   On: 08/02/2023 06:53   CT Renal Stone Study  Result Date: 08/01/2023 CLINICAL DATA:  Back pain and dysuria. Currently being treated for urinary tract infection EXAM: CT ABDOMEN AND PELVIS WITHOUT CONTRAST TECHNIQUE: Multidetector CT imaging of the abdomen and pelvis was performed following the standard protocol without IV contrast. RADIATION DOSE REDUCTION: This exam was performed according to the departmental dose-optimization program which includes automated exposure control, adjustment of the mA and/or kV according to patient size and/or use of iterative reconstruction technique. COMPARISON:  CT abdomen and pelvis dated 02/28/2021, MR thoracic spine dated 02/22/2022 FINDINGS: Lower chest: Right middle lobe consolidation with air bronchograms. No pleural effusion or pneumothorax demonstrated. Partially imaged heart size is normal. Coronary artery calcifications. Hepatobiliary: No focal hepatic lesions. No intra or extrahepatic biliary ductal dilation. Normal gallbladder. Pancreas: No focal lesions or main ductal dilation. Spleen: Normal in size without focal abnormality. Adrenals/Urinary Tract: No adrenal nodules. No suspicious renal mass by noncontrast technique, calculi or hydronephrosis. No focal bladder wall thickening. Stomach/Bowel: Normal appearance of the stomach. Left lower quadrant colostomy. Parastomal hernia contains fat and a short loop of transverse colon. No evidence of bowel wall thickening, distention, or inflammatory changes. Excluded rectum demonstrates diverticulosis. Normal appendix. Vascular/Lymphatic: Aortic atherosclerosis. No enlarged abdominal or pelvic lymph nodes. Reproductive: No adnexal masses. Other: No free fluid, fluid collection, or free air. Musculoskeletal: Multilevel  degenerative changes of the partially imaged thoracic and lumbar spine. Interval new superior endplate compression of T10 since 02/22/2022. Unchanged compression of T11 with interval vertebral augmentation as well as partially imaged T8. IMPRESSION: 1. Right middle lobe consolidation with air bronchograms, suspicious for pneumonia. 2. Interval new superior endplate compression of T10 since 02/22/2022. Unchanged compression deformity of T11 with interval vertebral augmentation as well as at partially imaged T8. 3. Left lower quadrant colostomy with parastomal hernia containing fat and a short loop of transverse colon. No evidence of bowel obstruction. 4. No hydronephrosis. 5. Aortic Atherosclerosis (ICD10-I70.0). Coronary artery calcifications. Assessment for potential risk factor modification, dietary therapy or pharmacologic therapy may be warranted, if clinically indicated. Electronically Signed   By: Agustin Cree M.D.   On: 08/01/2023 13:12  Scheduled Meds:  apixaban  5 mg Oral BID   aspirin EC  81 mg Oral Daily   atorvastatin  20 mg Oral Daily   azithromycin  500 mg Oral Daily   fluticasone  2 spray Each Nare Daily   guaiFENesin  600 mg Oral BID   levothyroxine  88 mcg Oral Q0600   loratadine  10 mg Oral Daily   midodrine  5 mg Oral TID WC   mirtazapine  15 mg Oral QHS   pantoprazole  40 mg Oral Daily   sodium bicarbonate  650 mg Oral Daily   sodium chloride flush  10-40 mL Intracatheter Q12H   tamsulosin  0.4 mg Oral QPC supper   Continuous Infusions:  cefTRIAXone (ROCEPHIN)  IV 2 g (08/02/23 1845)   sodium chloride       LOS: 2 days      Tresa Moore, MD [facility]   If 7PM-7AM, please contact night-coverage  08/03/2023, 11:50 AM

## 2023-08-03 NOTE — Evaluation (Signed)
Occupational Therapy Evaluation Patient Details Name: Desiree Madden MRN: [id number] DOB: 1951/12/01 Today's Date: 08/03/2023   History of Present Illness Pt is a 72 year old female admitted with severe sepsis, source of infection considered to be UTI plus right middle lobe pneumonia     PMH significant for CKD stage IV, HTN, hypothyroidism, PAF on Eliquis, CAD/MI with CABG in 2017, chronic combined HFrEF (LVEF 45% in 2022) and HFpEF, spinal stenosis chronic back pain and chronic narcotic dependence, partial colectomy and chronic colostomy bag   Clinical Impression   Chart reviewed, pt greeted in bed, agreeable to OT evaluation. Pt is alert and oriented x4. PTA pt is MOD I-I in ADL/IADL, amb with no AD. Pt is performing ADL/functional mobility at a supervision level with RW. Pt presents with deficits in activity tolerance and endurance affecting safe and optimal ADL completion. OT will follow acutely to address deficits.       If plan is discharge home, recommend the following: Assist for transportation;Assistance with cooking/housework;A little help with bathing/dressing/bathroom;A little help with walking and/or transfers    Functional Status Assessment  Patient has had a recent decline in their functional status and demonstrates the ability to make significant improvements in function in a reasonable and predictable amount of time.  Equipment Recommendations  None recommended by OT    Recommendations for Other Services       Precautions / Restrictions Precautions Precautions: Fall Precaution Comments: watch bp Restrictions Weight Bearing Restrictions: No      Mobility Bed Mobility Overal bed mobility: Modified Independent                  Transfers Overall transfer level: Needs assistance   Transfers: Sit to/from Stand Sit to Stand: Supervision                  Balance Overall balance assessment: Needs assistance Sitting-balance support: Feet  supported Sitting balance-Leahy Scale: Normal     Standing balance support: Bilateral upper extremity supported, During functional activity, Reliant on assistive device for balance Standing balance-Leahy Scale: Good                             ADL either performed or assessed with clinical judgement   ADL Overall ADL's : Needs assistance/impaired Eating/Feeding: Set up;Sitting   Grooming: Wash/dry hands;Standing;Supervision/safety               Lower Body Dressing: Minimal assistance Lower Body Dressing Details (indicate cue type and reason): socks Toilet Transfer: Contact guard assist;Rolling walker (2 wheels);BSC/3in1;Cueing for sequencing   Toileting- Clothing Manipulation and Hygiene: Supervision/safety;Sitting/lateral lean       Functional mobility during ADLs: Contact guard assist;Rolling walker (2 wheels) (approx 15' two attempts, pt reports BLE feel weak)       Vision Patient Visual Report: No change from baseline       Perception         Praxis         Pertinent Vitals/Pain Pain Assessment Pain Assessment: 0-10 Pain Score: 5  Pain Location: back Pain Descriptors / Indicators: Discomfort Pain Intervention(s): Limited activity within patient's tolerance, Monitored during session     Extremity/Trunk Assessment Upper Extremity Assessment Upper Extremity Assessment: Overall WFL for tasks assessed   Lower Extremity Assessment Lower Extremity Assessment: Generalized weakness   Cervical / Trunk Assessment Cervical / Trunk Assessment: Normal   Communication Communication Communication: No apparent difficulties Cueing Techniques: Verbal cues;Tactile cues  Cognition Arousal: Alert Behavior During Therapy: WFL for tasks assessed/performed Overall Cognitive Status: Within Functional Limits for tasks assessed                                       General Comments  BP 108/68 (MAP 81), HR 94 bpm after mobility,spo2 >95% on  RA    Exercises Other Exercises Other Exercises: edu re: role of OT, role of rehab, discharge recommendations   Shoulder Instructions      Home Living Family/patient expects to be discharged to:: Private residence Living Arrangements: Spouse/significant other Available Help at Discharge: Family;Available 24 hours/day Type of Home: Mobile home (daugther lives next door) Home Access: Stairs to enter Secretary/administrator of Steps: 4 Entrance Stairs-Rails: Right;Left;Can reach both Home Layout: One level     Bathroom Shower/Tub: IT trainer: Standard Bathroom Accessibility: Yes How Accessible: Accessible via walker Home Equipment: Pharmacist, hospital (2 wheels);BSC/3in1;Hand held shower head          Prior Functioning/Environment Prior Level of Function : Independent/Modified Independent;Driving             Mobility Comments: amb with no AD, no reported falls ADLs Comments: MOD I in ADL/IADL        OT Problem List: Decreased activity tolerance      OT Treatment/Interventions: Self-care/ADL training;Therapeutic exercise;Patient/family education;DME and/or AE instruction;Energy conservation    OT Goals(Current goals can be found in the care plan section) Acute Rehab OT Goals Patient Stated Goal: get stronger OT Goal Formulation: With patient Time For Goal Achievement: 08/17/23 Potential to Achieve Goals: Good ADL Goals Pt Will Perform Grooming: with modified independence;standing Pt Will Perform Lower Body Dressing: with modified independence;sit to/from stand Pt Will Transfer to Toilet: with modified independence;ambulating Pt Will Perform Toileting - Clothing Manipulation and hygiene: with modified independence;sit to/from stand  OT Frequency: Min 1X/week    Co-evaluation              AM-PAC OT "6 Clicks" Daily Activity     Outcome Measure Help from another person eating meals?: None Help from another person  taking care of personal grooming?: None Help from another person toileting, which includes using toliet, bedpan, or urinal?: None Help from another person bathing (including washing, rinsing, drying)?: A Little Help from another person to put on and taking off regular upper body clothing?: None Help from another person to put on and taking off regular lower body clothing?: A Little 6 Click Score: 22   End of Session Equipment Utilized During Treatment: Rolling walker (2 wheels) Nurse Communication: Mobility status  Activity Tolerance: Patient tolerated treatment well Patient left: in bed;with call bell/phone within reach;with bed alarm set  OT Visit Diagnosis: Other abnormalities of gait and mobility (R26.89)                Time: 6295-2841 OT Time Calculation (min): 18 min Charges:  OT General Charges $OT Visit: 1 Visit OT Evaluation $OT Eval Low Complexity: 1 Low  Oleta Mouse, OTD OTR/L  08/03/23, 12:08 PM

## 2023-08-03 NOTE — Evaluation (Signed)
Physical Therapy Evaluation Patient Details Name: Desiree Madden MRN: [id number] DOB: 27-Aug-1951 Today's Date: 08/03/2023  History of Present Illness  Pt is a 72 year old female admitted with severe sepsis, source of infection considered to be UTI plus right middle lobe pneumonia     PMH significant for CKD stage IV, HTN, hypothyroidism, PAF on Eliquis, CAD/MI with CABG in 2017, chronic combined HFrEF (LVEF 45% in 2022) and HFpEF, spinal stenosis chronic back pain and chronic narcotic dependence, partial colectomy and chronic colostomy bag  Clinical Impression   Pt is received in bed, she is agreeable to PT session. Pt does not report symptoms of dizziness or lightheadedness at beginning of session. Pt performs overall mobility supervision assist for safety. Pt able to amb approx 15 ft within room without AD without LOB. Limited amb distance due to prior amb activity with mobility specialist and OT. Pt is eager to go home and reports "my leg strength will get better the more I walk". Assessed vitals following mobility and were WNL with no reports of dizziness or lightheadedness. Pt would continue to benefit from mobility specialist to focus on increasing amb distance and rebuild confidence with amb without AD. No noted impairments during PT evaluation, therefore not recommending PT follow up at this time. Communicated with care team via secure chat regarding discharge disposition. Will plan to discharge PT services.         If plan is discharge home, recommend the following: Help with stairs or ramp for entrance   Can travel by private vehicle        Equipment Recommendations None recommended by PT  Recommendations for Other Services       Functional Status Assessment Patient has not had a recent decline in their functional status     Precautions / Restrictions Precautions Precautions: Fall Precaution Comments: watch bp Restrictions Weight Bearing Restrictions: No      Mobility   Bed Mobility Overal bed mobility: Modified Independent             General bed mobility comments: uses bed railings to sit EOB with no cuing necessary    Transfers Overall transfer level: Needs assistance Equipment used: Rolling walker (2 wheels) Transfers: Sit to/from Stand Sit to Stand: Supervision           General transfer comment: Able to perform STS with no corrections on hand placement using RW    Ambulation/Gait Ambulation/Gait assistance: Supervision Gait Distance (Feet): 15 Feet Assistive device: None Gait Pattern/deviations: Step-through pattern, Decreased step length - right, Decreased step length - left, Decreased stride length, Wide base of support Gait velocity: normal     General Gait Details: Able to amb within room without AD with slight wide base of support and decreased stride length noted but otherwise good  Stairs            Wheelchair Mobility     Tilt Bed    Modified Rankin (Stroke Patients Only)       Balance Overall balance assessment: Needs assistance Sitting-balance support: Feet supported Sitting balance-Leahy Scale: Normal Sitting balance - Comments: Able to maintain seated balance EOB during functional activities without BUE support   Standing balance support: No upper extremity supported Standing balance-Leahy Scale: Good Standing balance comment: Able to maintain static standing balance with min swaying noted and no reports of dizziness  Pertinent Vitals/Pain Pain Assessment Pain Assessment: No/denies pain    Home Living Family/patient expects to be discharged to:: Private residence Living Arrangements: Spouse/significant other Available Help at Discharge: Family;Available 24 hours/day Type of Home: Mobile home Home Access: Stairs to enter Entrance Stairs-Rails: Right;Left;Can reach both Entrance Stairs-Number of Steps: 4   Home Layout: One level Home Equipment: Advice worker (2 wheels);BSC/3in1;Hand held shower head;Wheelchair - manual;Cane - single point Additional Comments: Has multi equipment from prior needs    Prior Function Prior Level of Function : Independent/Modified Independent;Driving             Mobility Comments: amb with no AD, no reported falls ADLs Comments: MOD I in ADL/IADL     Extremity/Trunk Assessment   Upper Extremity Assessment Upper Extremity Assessment: Defer to OT evaluation    Lower Extremity Assessment Lower Extremity Assessment: Overall WFL for tasks assessed    Cervical / Trunk Assessment Cervical / Trunk Assessment: Normal  Communication   Communication Communication: No apparent difficulties Cueing Techniques: Verbal cues;Tactile cues  Cognition Arousal: Alert Behavior During Therapy: WFL for tasks assessed/performed Overall Cognitive Status: Within Functional Limits for tasks assessed                                 General Comments: Pleasant and willing to work with PT        General Comments General comments (skin integrity, edema, etc.): BP 108/66 mmHg (MAP 80), HR 97 bpm, SpO2 97% following mobility on RA    Exercises Other Exercises Other Exercises: Educated Pt regarding PT and discharge recommendation   Assessment/Plan    PT Assessment Patient does not need any further PT services  PT Problem List         PT Treatment Interventions      PT Goals (Current goals can be found in the Care Plan section)  Acute Rehab PT Goals Patient Stated Goal: to go home PT Goal Formulation: With patient Time For Goal Achievement: 08/03/23 Potential to Achieve Goals: Good    Frequency       Co-evaluation               AM-PAC PT "6 Clicks" Mobility  Outcome Measure Help needed turning from your back to your side while in a flat bed without using bedrails?: None Help needed moving from lying on your back to sitting on the side of a flat bed without using  bedrails?: None Help needed moving to and from a bed to a chair (including a wheelchair)?: None Help needed standing up from a chair using your arms (e.g., wheelchair or bedside chair)?: None Help needed to walk in hospital room?: None Help needed climbing 3-5 steps with a railing? : A Little 6 Click Score: 23    End of Session   Activity Tolerance: Patient tolerated treatment well Patient left: in bed;with call bell/phone within reach   PT Visit Diagnosis: Muscle weakness (generalized) (M62.81);Unsteadiness on feet (R26.81)    Time: 9562-1308 PT Time Calculation (min) (ACUTE ONLY): 14 min   Charges:                 Elmon Else, SPT   Chassity Ludke 08/03/2023, 2:58 PM

## 2023-08-03 NOTE — Progress Notes (Signed)
[facility]  ROUNDING NOTE   Subjective:   Desiree Madden is a 72 y.o. female with hypertension, coronary artery disease status post CABG, atrial fibrillation, diverticulosis, and chronic kidney disease stage 3b. Patient presents to the ED with back pain, dizziness and burning with urination. She has been admitted for Acute pyelonephritis [N10] Sepsis (HCC) [A41.9] Pneumonia due to infectious organism, unspecified laterality, unspecified part of lung [J18.9] Sepsis with acute renal failure and septic shock, due to unspecified organism, unspecified acute renal failure type (HCC) [A41.9, R65.21, N17.9]   Patient is known to our office and is followed by Dr Cherylann Ratel. He was last seen in office on 04/22/23 for routine follow up.   Sitting up in bed Alert and oriented Feels better than yesterday Appetite improving  Creatinine 1.89  Objective:  Vital signs in last 24 hours:  Temp:  [97.4 F (36.3 C)-98.2 F (36.8 C)] 97.8 F (36.6 C) (08/25 1100) Pulse Rate:  [78-92] 90 (08/25 1102) Resp:  [16-20] 16 (08/25 0801) BP: (83-121)/(50-65) 108/60 (08/25 1140) SpO2:  [91 %-100 %] 99 % (08/25 1102)  Weight change:  Filed Weights   08/01/23 0930  Weight: 72.6 kg    Intake/Output: I/O last 3 completed shifts: In: 515.6 [I.V.:415.6; IV Piggyback:100] Out: -    Intake/Output this shift:  No intake/output data recorded.  Physical Exam: General: NAD  Head: Normocephalic, atraumatic. Moist oral mucosal membranes  Eyes: Anicteric  Neck: Supple  Lungs:  Clear to auscultation, normal effort, room air  Heart: Regular rate and rhythm  Abdomen:  Soft, nontender, urostomy  Extremities:  trace peripheral edema.  Neurologic: Alert and oriented, moving all four extremities  Skin: No lesions  Access: None    Basic Metabolic Panel: Recent Labs  Lab 08/01/23 0942 08/02/23 0832 08/03/23 0600  NA 132* 137 138  K 4.3 3.7 3.2*  CL 101 113* 108  CO2 13* 14* 22  GLUCOSE 91 67*  85  BUN 85* 59* 39*  CREATININE 5.34* 3.22* 1.89*  CALCIUM 8.5* 7.4* 6.9*    Liver Function Tests: Recent Labs  Lab 08/01/23 0942  AST 47*  ALT 19  ALKPHOS 81  BILITOT 0.6  PROT 7.6  ALBUMIN 3.1*   No results for input(s): "LIPASE", "AMYLASE" in the last 168 hours. No results for input(s): "AMMONIA" in the last 168 hours.  CBC: Recent Labs  Lab 08/01/23 0942 08/02/23 0832 08/03/23 0600  WBC 10.8* 7.5 8.1  NEUTROABS 9.0* 5.6 5.3  HGB 10.3* 9.2* 9.1*  HCT 30.9* 27.7* 26.4*  MCV 82.8 84.7 81.2  PLT 378 271 299    Cardiac Enzymes: No results for input(s): "CKTOTAL", "CKMB", "CKMBINDEX", "TROPONINI" in the last 168 hours.  BNP: Invalid input(s): "POCBNP"  CBG: Recent Labs  Lab 08/01/23 2042  GLUCAP 76    Microbiology: Results for orders placed or performed during the hospital encounter of 08/01/23  Culture, blood (Routine x 2)     Status: None (Preliminary result)   Collection Time: 08/01/23  9:42 AM   Specimen: BLOOD  Result Value Ref Range Status   Specimen Description BLOOD BLOOD LEFT ARM  Final   Special Requests   Final    BOTTLES DRAWN AEROBIC AND ANAEROBIC Blood Culture adequate volume   Culture   Final    NO GROWTH 2 DAYS Performed at [facility], [address], [place], [place] [address]    Report Status PENDING  Incomplete  Urine Culture     Status: None   Collection  Time: 08/01/23 10:40 AM   Specimen: Urine, Clean Catch  Result Value Ref Range Status   Specimen Description   Final    URINE, CLEAN CATCH Performed at [facility], [address], [place], [place] [address]    Special Requests   Final    NONE Performed at [facility], [address] [place], [place] [address]    Culture   Final    NO GROWTH Performed at [facility], [address], [place], [place] [address]    Report Status 08/02/2023 FINAL  Final  Culture, blood (Routine x 2)     Status: None (Preliminary result)   Collection  Time: 08/01/23 10:52 AM   Specimen: BLOOD  Result Value Ref Range Status   Specimen Description BLOOD BLOOD LEFT ARM  Final   Special Requests   Final    BOTTLES DRAWN AEROBIC AND ANAEROBIC Blood Culture adequate volume   Culture   Final    NO GROWTH 2 DAYS Performed at [facility], [address], [place], [place] [address]    Report Status PENDING  Incomplete    Coagulation Studies: Recent Labs    08/01/23 0942  LABPROT 32.4*  INR 3.1*    Urinalysis: Recent Labs    08/01/23 1033  COLORURINE YELLOW*  LABSPEC 1.006  PHURINE 6.0  GLUCOSEU NEGATIVE  HGBUR LARGE*  BILIRUBINUR NEGATIVE  KETONESUR NEGATIVE  PROTEINUR NEGATIVE  NITRITE NEGATIVE  LEUKOCYTESUR LARGE*      Imaging: DG Chest 1 View  Result Date: 08/02/2023 CLINICAL DATA:  CHF EXAM: CHEST  1 VIEW COMPARISON:  03/20/2021 FINDINGS: Indistinct density at the right middle lobe by prior CT, no detected change. No Kerley lines, effusion, or pneumothorax. Normal heart size and mediastinal contours. Prior CABG. IMPRESSION: Right middle lobe infiltrate as seen on abdominal CT yesterday. No new abnormality or pulmonary edema. Electronically Signed   By: Tiburcio Pea M.D.   On: 08/02/2023 06:53   CT Renal Stone Study  Result Date: 08/01/2023 CLINICAL DATA:  Back pain and dysuria. Currently being treated for urinary tract infection EXAM: CT ABDOMEN AND PELVIS WITHOUT CONTRAST TECHNIQUE: Multidetector CT imaging of the abdomen and pelvis was performed following the standard protocol without IV contrast. RADIATION DOSE REDUCTION: This exam was performed according to the departmental dose-optimization program which includes automated exposure control, adjustment of the mA and/or kV according to patient size and/or use of iterative reconstruction technique. COMPARISON:  CT abdomen and pelvis dated 02/28/2021, MR thoracic spine dated 02/22/2022 FINDINGS: Lower chest: Right middle lobe consolidation with air  bronchograms. No pleural effusion or pneumothorax demonstrated. Partially imaged heart size is normal. Coronary artery calcifications. Hepatobiliary: No focal hepatic lesions. No intra or extrahepatic biliary ductal dilation. Normal gallbladder. Pancreas: No focal lesions or main ductal dilation. Spleen: Normal in size without focal abnormality. Adrenals/Urinary Tract: No adrenal nodules. No suspicious renal mass by noncontrast technique, calculi or hydronephrosis. No focal bladder wall thickening. Stomach/Bowel: Normal appearance of the stomach. Left lower quadrant colostomy. Parastomal hernia contains fat and a short loop of transverse colon. No evidence of bowel wall thickening, distention, or inflammatory changes. Excluded rectum demonstrates diverticulosis. Normal appendix. Vascular/Lymphatic: Aortic atherosclerosis. No enlarged abdominal or pelvic lymph nodes. Reproductive: No adnexal masses. Other: No free fluid, fluid collection, or free air. Musculoskeletal: Multilevel degenerative changes of the partially imaged thoracic and lumbar spine. Interval new superior endplate compression of T10 since 02/22/2022. Unchanged compression of T11 with interval vertebral augmentation as well as  partially imaged T8. IMPRESSION: 1. Right middle lobe consolidation with air bronchograms, suspicious for pneumonia. 2. Interval new superior endplate compression of T10 since 02/22/2022. Unchanged compression deformity of T11 with interval vertebral augmentation as well as at partially imaged T8. 3. Left lower quadrant colostomy with parastomal hernia containing fat and a short loop of transverse colon. No evidence of bowel obstruction. 4. No hydronephrosis. 5. Aortic Atherosclerosis (ICD10-I70.0). Coronary artery calcifications. Assessment for potential risk factor modification, dietary therapy or pharmacologic therapy may be warranted, if clinically indicated. Electronically Signed   By: Agustin Cree M.D.   On: 08/01/2023 13:12      Medications:    cefTRIAXone (ROCEPHIN)  IV 2 g (08/02/23 1845)   sodium chloride      apixaban  5 mg Oral BID   aspirin EC  81 mg Oral Daily   atorvastatin  20 mg Oral Daily   azithromycin  500 mg Oral Daily   fluticasone  2 spray Each Nare Daily   guaiFENesin  600 mg Oral BID   levothyroxine  88 mcg Oral Q0600   loratadine  10 mg Oral Daily   midodrine  5 mg Oral BID WC   mirtazapine  15 mg Oral QHS   pantoprazole  40 mg Oral Daily   sodium bicarbonate  650 mg Oral Daily   sodium chloride flush  10-40 mL Intracatheter Q12H   tamsulosin  0.4 mg Oral QPC supper   acetaminophen, albuterol, ALPRAZolam, hydrALAZINE, methocarbamol, ondansetron, oxyCODONE, sodium chloride flush  Assessment/ Plan:  Ms. Desiree Madden is a 72 y.o.  female ith hypertension, coronary artery disease status post CABG, atrial fibrillation, diverticulosis, and chronic kidney disease stage 3b. Patient presents to the ED with back pain, dizziness and burning with urination. She has been admitted for Acute pyelonephritis [N10] Sepsis (HCC) [A41.9] Pneumonia due to infectious organism, unspecified laterality, unspecified part of lung [J18.9] Sepsis with acute renal failure and septic shock, due to unspecified organism, unspecified acute renal failure type (HCC) [A41.9, R65.21, N17.9]   Acute Kidney Injury on chronic kidney disease stage IIIb with baseline creatinine 1.7 and GFR of 32 on 04/04/23.  Acute kidney injury appears prerenal from dehydration. Urostomy bag changed yesterday, remains dry. UA positive for UTI also.  Chronic kidney disease is secondary to Hypertension. CT renal stone negative for hydronephrosis. Agree with IVF for gentle hydration. Continue antibiotics for UTI.  Creatinine has already responded well to IV hydration. No acute need for dialysis. Avoid nephrotoxic agents and hypotension. Will continue to monitor.   Lab Results  Component Value Date   CREATININE 1.89 (H) 08/03/2023    CREATININE 3.22 (H) 08/02/2023   CREATININE 5.34 (H) 08/01/2023   No intake or output data in the 24 hours ending 08/03/23 1214  2. Acute metabolic acidosis, S bicarb corrected to 22. Will stop IVF  3. Anemia of chronic kidney disease Lab Results  Component Value Date   HGB 9.1 (L) 08/03/2023    Hgb within desired goal  4. Hypertension with Chronic kidney disease. Home regimen includes lisinopril and metoprolol. Lisinopril held due to kidney injury. Now on Midodrine twice daily    LOS: 2   8/25/202412:14 PM

## 2023-08-04 DIAGNOSIS — R6520 Severe sepsis without septic shock: Secondary | ICD-10-CM | POA: Diagnosis not present

## 2023-08-04 DIAGNOSIS — N170 Acute kidney failure with tubular necrosis: Secondary | ICD-10-CM | POA: Diagnosis not present

## 2023-08-04 DIAGNOSIS — A419 Sepsis, unspecified organism: Secondary | ICD-10-CM | POA: Diagnosis not present

## 2023-08-04 LAB — BASIC METABOLIC PANEL
Anion gap: 11 (ref 5–15)
BUN: 24 mg/dL — ABNORMAL HIGH (ref 8–23)
CO2: 22 mmol/L (ref 22–32)
Calcium: 7.3 mg/dL — ABNORMAL LOW (ref 8.9–10.3)
Chloride: 108 mmol/L (ref 98–111)
Creatinine, Ser: 1.56 mg/dL — ABNORMAL HIGH (ref 0.44–1.00)
GFR, Estimated: 35 mL/min — ABNORMAL LOW (ref >60)
Glucose, Bld: 91 mg/dL (ref 70–99)
Potassium: 3.2 mmol/L — ABNORMAL LOW (ref 3.5–5.1)
Sodium: 141 mmol/L (ref 135–145)

## 2023-08-04 MED ORDER — POTASSIUM CHLORIDE CRYS ER 20 MEQ PO TBCR
40.0000 meq | EXTENDED_RELEASE_TABLET | Freq: Once | ORAL | Status: AC
  Administered 2023-08-04: 40 meq via ORAL
  Filled 2023-08-04: qty 2

## 2023-08-04 MED ORDER — AMOXICILLIN-POT CLAVULANATE 875-125 MG PO TABS: 1.0000 | ORAL_TABLET | Freq: Two times a day (BID) | ORAL | 0 refills | Status: AC

## 2023-08-04 NOTE — Discharge Summary (Signed)
Physician Discharge Summary  Desiree Madden UYQ:[id number] DOB: 1951/03/10 DOA: 08/01/2023  PCP: [facility]  Admit date: 08/01/2023 Discharge date: 08/04/2023  Admitted From: Home Disposition:  Home  Recommendations for Outpatient Follow-up:  Follow up with PCP in 1-2 weeks   Home Health:No Equipment/Devices:None   Discharge Condition:Stable  CODE STATUS:FULL  Diet recommendation: Reg  Brief/Interim Summary:  72 y.o. female with medical history significant of CKD stage IV, HTN, hypothyroidism, PAF on Eliquis, CAD/MI with CABG in 2017, chronic combined HFrEF (LVEF 45% in 2022) and HFpEF, spinal stenosis chronic back pain and chronic narcotic dependence, partial colectomy and chronic colostomy bag, presented with worsening of lower abdominal pain dysuria worsening of back pain and cough.   Patient started to have a new dry cough about 2 to 3 weeks ago " due to the heat" denies any sputum production but does have night sweat.  2 weeks ago, patient started to have dysuria burning sensation and lower abdominal pain.  She went to see PCP 2 days ago was diagnosed with UTI and she started to take antibiotics of Omnicef yesterday.  She has a CKD stage IV, and 2 days ago when she visited her PCP it was found her blood pressure in the 70s.  Patient reported that she continued to take blood pressure medications this morning, and she does admit that in the last few days she has felt very dizzy and meantime she lost her appetite and did not eat or drink much for the last 3 to 4 days and became very dehydrated.  8/26: Patient did meet sepsis criteria but interestingly the urine culture demonstrated no growth.  She does have evidence of possible bacterial pneumonia with infiltrate noted on chest imaging.  As such will complete 5 total days of antibiotic.  For sepsis physiology did resolve at time of discharge.  Kidney function improving.  Stable for discharge home.      Discharge Diagnoses:   Principal Problem:   Sepsis (HCC) Active Problems:   AKI (acute kidney injury) (HCC)   PNA (pneumonia)   Sepsis secondary to UTI (HCC)  Sepsis physiology resolved.  Unable to exclude bacterial infection.  Urine culture negative.  Patient received 3 doses of intravenous ceftriaxone.  Will de-escalate to Augmentin complete additional 2 days for total 5-day antibiotic course.  Acute kidney injury markedly improved.  Suspect prerenal azotemia as patient responded quite well to fluids.  Blood pressure improved.  Creatinine approaching baseline at time of DC.  Will recommend discontinuation temporarily of home lisinopril.  Follow-up outpatient PCP 1 week.  Further discussion regarding blood pressure control strategy at that time.  Discharge Instructions  Discharge Instructions     Diet - low sodium heart healthy   Complete by: As directed    Increase activity slowly   Complete by: As directed       Allergies as of 08/04/2023       Reactions   Cymbalta [duloxetine Hcl] Nausea And Vomiting   Baclofen    Other reaction(s): Headache Other reaction(s): Headache, Other (See Comments) Other reaction(s): Headache Other reaction(s): Headache   Sertraline    Other reaction(s): Other (See Comments), Other (See Comments) Muscle aches Muscle aches Other reaction(s): Other (See Comments), Other (See Comments) Muscle aches Other reaction(s): Other (See Comments), Other (See Comments) Muscle aches Muscle aches Muscle aches Muscle aches Other reaction(s): Other (See Comments), Other (See Comments) Muscle aches Muscle aches   Sulfa Antibiotics    Other reaction(s): Other (See Comments), Other (See  Comments) Pt was told not to take it due to colitis Pt was told not to take it due to colitis   Ibuprofen Nausea And Vomiting   Other reaction(s): Other (See Comments) Due to stage 3 kidney disease   Mirtazapine    Other reaction(s): Other (See Comments), Other (See  Comments) irritability irritability   Trazodone Diarrhea   Trazodone And Nefazodone         Medication List     STOP taking these medications    aspirin EC 81 MG tablet   cefdinir 300 MG capsule Commonly known as: OMNICEF   diclofenac Sodium 1 % Gel Commonly known as: VOLTAREN   dicyclomine 20 MG tablet Commonly known as: BENTYL   escitalopram 5 MG tablet Commonly known as: LEXAPRO   lisinopril 5 MG tablet Commonly known as: ZESTRIL   polyethylene glycol powder 17 GM/SCOOP powder Commonly known as: GLYCOLAX/MIRALAX   tamsulosin 0.4 MG Caps capsule Commonly known as: FLOMAX       TAKE these medications    acetaminophen 325 MG tablet Commonly known as: TYLENOL Take 1-2 tablets (325-650 mg total) by mouth every 4 (four) hours as needed for mild pain.   albuterol 108 (90 Base) MCG/ACT inhaler Commonly known as: VENTOLIN HFA albuterol sulfate HFA 90 mcg/actuation aerosol inhaler  Inhale 2 puffs every 4 hours by inhalation route.   ALPRAZolam 0.25 MG tablet Commonly known as: XANAX Take 1 tablet (0.25 mg total) by mouth 2 (two) times daily as needed for anxiety or sleep.   amoxicillin-clavulanate 875-125 MG tablet Commonly known as: AUGMENTIN Take 1 tablet by mouth 2 (two) times daily for 2 days. Start taking on: August 05, 2023   apixaban 5 MG Tabs tablet Commonly known as: ELIQUIS Take 1 tablet (5 mg total) by mouth 2 (two) times daily.   ascorbic acid 500 MG tablet Commonly known as: VITAMIN C Take 1 tablet (500 mg total) by mouth 2 (two) times daily.   atorvastatin 20 MG tablet Commonly known as: LIPITOR Take 1 tablet (20 mg total) by mouth daily.   azelastine 0.1 % nasal spray Commonly known as: ASTELIN azelastine 137 mcg (0.1 %) nasal spray aerosol   cetirizine 10 MG tablet Commonly known as: ZYRTEC cetirizine 10 mg tablet  Take 1 tablet every day by oral route.   chlorhexidine gluconate (MEDLINE KIT) 0.12 % solution Commonly known as:  PERIDEX 15 mLs by Mouth Rinse route 2 (two) times daily.   ciclopirox 8 % solution Commonly known as: PENLAC Apply 1 Application topically at bedtime.   fluticasone 50 MCG/ACT nasal spray Commonly known as: FLONASE Place 2 sprays into both nostrils daily.   levothyroxine 88 MCG tablet Commonly known as: SYNTHROID Take 88 mcg by mouth daily.   metoprolol tartrate 25 MG tablet Commonly known as: LOPRESSOR Take 25 mg by mouth daily. X 14 days   mirtazapine 15 MG tablet Commonly known as: REMERON Take 1 tablet (15 mg total) by mouth at bedtime.   multivitamin with minerals Tabs tablet Take 1 tablet by mouth daily.   NEW IMAGE DRAINABLE POUCH Pouch Misc Use 1 Bag 2 (two) times daily   nitroGLYCERIN 0.4 MG SL tablet Commonly known as: NITROSTAT Place under the tongue.   oxyCODONE 5 MG immediate release tablet Commonly known as: Oxy IR/ROXICODONE Take 1 tablet (5 mg total) by mouth 2 (two) times daily as needed for moderate pain or severe pain.   pantoprazole 40 MG tablet Commonly known as: PROTONIX Take 1  tablet (40 mg total) by mouth daily.   simethicone 80 MG chewable tablet Commonly known as: MYLICON Chew 80 mg by mouth every 6 (six) hours as needed for flatulence.   sodium bicarbonate 650 MG tablet Take 650 mg by mouth daily.        Follow-up Information     [facility] Schedule an appointment as soon as possible for a visit in 1 week(s).   Contact information: [address] [place] [address] [phone]                Allergies  Allergen Reactions   Cymbalta [Duloxetine Hcl] Nausea And Vomiting   Baclofen     Other reaction(s): Headache Other reaction(s): Headache, Other (See Comments) Other reaction(s): Headache Other reaction(s): Headache    Sertraline     Other reaction(s): Other (See Comments), Other (See Comments) Muscle aches Muscle aches  Other reaction(s): Other (See Comments), Other (See  Comments) Muscle aches Other reaction(s): Other (See Comments), Other (See Comments) Muscle aches Muscle aches Muscle aches Muscle aches Other reaction(s): Other (See Comments), Other (See Comments) Muscle aches Muscle aches    Sulfa Antibiotics     Other reaction(s): Other (See Comments), Other (See Comments) Pt was told not to take it due to colitis Pt was told not to take it due to colitis    Ibuprofen Nausea And Vomiting    Other reaction(s): Other (See Comments) Due to stage 3 kidney disease    Mirtazapine     Other reaction(s): Other (See Comments), Other (See Comments) irritability irritability    Trazodone Diarrhea   Trazodone And Nefazodone     Consultations: Nephrology   Procedures/Studies: DG Chest 1 View  Result Date: 08/02/2023 CLINICAL DATA:  CHF EXAM: CHEST  1 VIEW COMPARISON:  03/20/2021 FINDINGS: Indistinct density at the right middle lobe by prior CT, no detected change. No Kerley lines, effusion, or pneumothorax. Normal heart size and mediastinal contours. Prior CABG. IMPRESSION: Right middle lobe infiltrate as seen on abdominal CT yesterday. No new abnormality or pulmonary edema. Electronically Signed   By: Tiburcio Pea M.D.   On: 08/02/2023 06:53   CT Renal Stone Study  Result Date: 08/01/2023 CLINICAL DATA:  Back pain and dysuria. Currently being treated for urinary tract infection EXAM: CT ABDOMEN AND PELVIS WITHOUT CONTRAST TECHNIQUE: Multidetector CT imaging of the abdomen and pelvis was performed following the standard protocol without IV contrast. RADIATION DOSE REDUCTION: This exam was performed according to the departmental dose-optimization program which includes automated exposure control, adjustment of the mA and/or kV according to patient size and/or use of iterative reconstruction technique. COMPARISON:  CT abdomen and pelvis dated 02/28/2021, MR thoracic spine dated 02/22/2022 FINDINGS: Lower chest: Right middle lobe consolidation with air  bronchograms. No pleural effusion or pneumothorax demonstrated. Partially imaged heart size is normal. Coronary artery calcifications. Hepatobiliary: No focal hepatic lesions. No intra or extrahepatic biliary ductal dilation. Normal gallbladder. Pancreas: No focal lesions or main ductal dilation. Spleen: Normal in size without focal abnormality. Adrenals/Urinary Tract: No adrenal nodules. No suspicious renal mass by noncontrast technique, calculi or hydronephrosis. No focal bladder wall thickening. Stomach/Bowel: Normal appearance of the stomach. Left lower quadrant colostomy. Parastomal hernia contains fat and a short loop of transverse colon. No evidence of bowel wall thickening, distention, or inflammatory changes. Excluded rectum demonstrates diverticulosis. Normal appendix. Vascular/Lymphatic: Aortic atherosclerosis. No enlarged abdominal or pelvic lymph nodes. Reproductive: No adnexal masses. Other: No free fluid, fluid collection, or free air. Musculoskeletal:  Multilevel degenerative changes of the partially imaged thoracic and lumbar spine. Interval new superior endplate compression of T10 since 02/22/2022. Unchanged compression of T11 with interval vertebral augmentation as well as partially imaged T8. IMPRESSION: 1. Right middle lobe consolidation with air bronchograms, suspicious for pneumonia. 2. Interval new superior endplate compression of T10 since 02/22/2022. Unchanged compression deformity of T11 with interval vertebral augmentation as well as at partially imaged T8. 3. Left lower quadrant colostomy with parastomal hernia containing fat and a short loop of transverse colon. No evidence of bowel obstruction. 4. No hydronephrosis. 5. Aortic Atherosclerosis (ICD10-I70.0). Coronary artery calcifications. Assessment for potential risk factor modification, dietary therapy or pharmacologic therapy may be warranted, if clinically indicated. Electronically Signed   By: Agustin Cree M.D.   On: 08/01/2023 13:12       Subjective: Seen and examined on the day of discharge.  Stable no distress.  Appropriate for discharge home.  Discharge Exam: Vitals:   08/04/23 0326 08/04/23 0809  BP: 101/65 127/69  Pulse: 91 (!) 108  Resp: 17 20  Temp: 98.6 F (37 C) 98.1 F (36.7 C)  SpO2: 98% 100%   Vitals:   08/03/23 2013 08/03/23 2325 08/04/23 0326 08/04/23 0809  BP: 115/69 101/68 101/65 127/69  Pulse: (!) 106 88 91 (!) 108  Resp: 18 18 17 20   Temp: 98.3 F (36.8 C) 98.2 F (36.8 C) 98.6 F (37 C) 98.1 F (36.7 C)  TempSrc:      SpO2: 90% (!) 88% 98% 100%  Weight:      Height:        General: Pt is alert, awake, not in acute distress Cardiovascular: RRR, S1/S2 +, no rubs, no gallops Respiratory: CTA bilaterally, no wheezing, no rhonchi Abdominal: Soft, NT, ND, bowel sounds + Extremities: no edema, no cyanosis    The results of significant diagnostics from this hospitalization (including imaging, microbiology, ancillary and laboratory) are listed below for reference.     Microbiology: Recent Results (from the past 240 hour(s))  Culture, blood (Routine x 2)     Status: None (Preliminary result)   Collection Time: 08/01/23  9:42 AM   Specimen: BLOOD  Result Value Ref Range Status   Specimen Description BLOOD BLOOD LEFT ARM  Final   Special Requests   Final    BOTTLES DRAWN AEROBIC AND ANAEROBIC Blood Culture adequate volume   Culture   Final    NO GROWTH 3 DAYS Performed at [facility], [address] [place], [place] [address]    Report Status PENDING  Incomplete  Urine Culture     Status: None   Collection Time: 08/01/23 10:40 AM   Specimen: Urine, Clean Catch  Result Value Ref Range Status   Specimen Description   Final    URINE, CLEAN CATCH Performed at [facility], [address], [place], [place] [address]    Special Requests   Final    NONE Performed at [facility], [address], [place], [place] [address]    Culture   Final     NO GROWTH Performed at [facility], [address], [place], [place] [address]    Report Status 08/02/2023 FINAL  Final  Culture, blood (Routine x 2)     Status: None (Preliminary result)   Collection Time: 08/01/23 10:52 AM   Specimen: BLOOD  Result Value Ref Range Status   Specimen Description BLOOD BLOOD LEFT ARM  Final   Special Requests   Final  BOTTLES DRAWN AEROBIC AND ANAEROBIC Blood Culture adequate volume   Culture   Final    NO GROWTH 3 DAYS Performed at [facility], [address] [place], [place] [address]    Report Status PENDING  Incomplete     Labs: BNP (last 3 results) No results for input(s): "BNP" in the last 8760 hours. Basic Metabolic Panel: Recent Labs  Lab 08/01/23 0942 08/02/23 0832 08/03/23 0600 08/04/23 0925  NA 132* 137 138 141  K 4.3 3.7 3.2* 3.2*  CL 101 113* 108 108  CO2 13* 14* 22 22  GLUCOSE 91 67* 85 91  BUN 85* 59* 39* 24*  CREATININE 5.34* 3.22* 1.89* 1.56*  CALCIUM 8.5* 7.4* 6.9* 7.3*   Liver Function Tests: Recent Labs  Lab 08/01/23 0942  AST 47*  ALT 19  ALKPHOS 81  BILITOT 0.6  PROT 7.6  ALBUMIN 3.1*   No results for input(s): "LIPASE", "AMYLASE" in the last 168 hours. No results for input(s): "AMMONIA" in the last 168 hours. CBC: Recent Labs  Lab 08/01/23 0942 08/02/23 0832 08/03/23 0600  WBC 10.8* 7.5 8.1  NEUTROABS 9.0* 5.6 5.3  HGB 10.3* 9.2* 9.1*  HCT 30.9* 27.7* 26.4*  MCV 82.8 84.7 81.2  PLT 378 271 299   Cardiac Enzymes: No results for input(s): "CKTOTAL", "CKMB", "CKMBINDEX", "TROPONINI" in the last 168 hours. BNP: Invalid input(s): "POCBNP" CBG: Recent Labs  Lab 08/01/23 2042  GLUCAP 76   D-Dimer No results for input(s): "DDIMER" in the last 72 hours. Hgb A1c No results for input(s): "HGBA1C" in the last 72 hours. Lipid Profile No results for input(s): "CHOL", "HDL", "LDLCALC", "TRIG", "CHOLHDL", "LDLDIRECT" in the last 72 hours. Thyroid function studies No results for  input(s): "TSH", "T4TOTAL", "T3FREE", "THYROIDAB" in the last 72 hours.  Invalid input(s): "FREET3" Anemia work up No results for input(s): "VITAMINB12", "FOLATE", "FERRITIN", "TIBC", "IRON", "RETICCTPCT" in the last 72 hours. Urinalysis    Component Value Date/Time   COLORURINE YELLOW (A) 08/01/2023 1033   APPEARANCEUR CLOUDY (A) 08/01/2023 1033   LABSPEC 1.006 08/01/2023 1033   PHURINE 6.0 08/01/2023 1033   GLUCOSEU NEGATIVE 08/01/2023 1033   HGBUR LARGE (A) 08/01/2023 1033   BILIRUBINUR NEGATIVE 08/01/2023 1033   KETONESUR NEGATIVE 08/01/2023 1033   PROTEINUR NEGATIVE 08/01/2023 1033   NITRITE NEGATIVE 08/01/2023 1033   LEUKOCYTESUR LARGE (A) 08/01/2023 1033   Sepsis Labs Recent Labs  Lab 08/01/23 0942 08/02/23 0832 08/03/23 0600  WBC 10.8* 7.5 8.1   Microbiology Recent Results (from the past 240 hour(s))  Culture, blood (Routine x 2)     Status: None (Preliminary result)   Collection Time: 08/01/23  9:42 AM   Specimen: BLOOD  Result Value Ref Range Status   Specimen Description BLOOD BLOOD LEFT ARM  Final   Special Requests   Final    BOTTLES DRAWN AEROBIC AND ANAEROBIC Blood Culture adequate volume   Culture   Final    NO GROWTH 3 DAYS Performed at [facility], [address], [place], [place] [address]    Report Status PENDING  Incomplete  Urine Culture     Status: None   Collection Time: 08/01/23 10:40 AM   Specimen: Urine, Clean Catch  Result Value Ref Range Status   Specimen Description   Final    URINE, CLEAN CATCH Performed at [facility], [address], [place], [place] [address]    Special Requests   Final    NONE Performed at [facility], [phone]  [address] [place], [place] [address]    Culture   Final    NO GROWTH Performed at [facility], [address] [place], [place] [address]    Report Status 08/02/2023 FINAL  Final  Culture, blood (Routine x 2)     Status: None (Preliminary result)   Collection  Time: 08/01/23 10:52 AM   Specimen: BLOOD  Result Value Ref Range Status   Specimen Description BLOOD BLOOD LEFT ARM  Final   Special Requests   Final    BOTTLES DRAWN AEROBIC AND ANAEROBIC Blood Culture adequate volume   Culture   Final    NO GROWTH 3 DAYS Performed at [facility], [address] [place], [place] [address]    Report Status PENDING  Incomplete     Time coordinating discharge: Over 30 minutes  SIGNED:   Tresa Moore, MD  [facility] 08/04/2023, 2:40 PM Pager   If 7PM-7AM, please contact night-coverage

## 2023-08-04 NOTE — Plan of Care (Signed)
  Problem: Activity: Goal: Ability to tolerate increased activity will improve Outcome: Progressing   Problem: Clinical Measurements: Goal: Ability to maintain a body temperature in the normal range will improve Outcome: Progressing   Problem: Respiratory: Goal: Ability to maintain adequate ventilation will improve Outcome: Progressing   Problem: Clinical Measurements: Goal: Ability to maintain clinical measurements within normal limits will improve Outcome: Progressing

## 2023-08-04 NOTE — Progress Notes (Signed)
Mobility Specialist - Progress Note   08/04/23 0855  Mobility  Activity Ambulated with assistance in hallway;Stood at bedside;Dangled on edge of bed  Level of Assistance Standby assist, set-up cues, supervision of patient - no hands on  Assistive Device Front wheel walker  Distance Ambulated (ft) 160 ft  Activity Response Tolerated well  Mobility Referral Yes  $Mobility charge 1 Mobility  Mobility Specialist Start Time (ACUTE ONLY) 0840  Mobility Specialist Stop Time (ACUTE ONLY) [id number]  Mobility Specialist Time Calculation (min) (ACUTE ONLY) 12 min   Pt EOB on RA upon arrival. Pt STS and ambulates in hallway SBA-indep with no physical assistance. Pt returns to EOB with needs in reach.   Terrilyn Saver  Mobility Specialist  08/04/23 8:58 AM

## 2023-08-04 NOTE — Care Management Important Message (Signed)
Important Message  Patient Details  Name: Desiree Madden MRN: [id number] Date of Birth: 01-Sep-1951   Medicare Important Message Given:  Yes     [name] 08/04/2023, 11:52 AM

## 2023-08-05 ENCOUNTER — Telehealth: Payer: Self-pay

## 2023-08-05 DIAGNOSIS — I10 Essential (primary) hypertension: Secondary | ICD-10-CM

## 2023-08-05 DIAGNOSIS — A419 Sepsis, unspecified organism: Secondary | ICD-10-CM

## 2023-08-05 NOTE — Consult Note (Signed)
[facility] Customer service manager [facility]) Accountable Care Organization (ACO) [facility] Liaison Note  08/05/2023  Desiree Madden [birth year] [id number]  Location: [facility] RN Hospital Liaison screened the patient remotely at [facility].  Insurance: [facility]   [name] is a 72 y.o. female who is a Primary Care Patient of [facility] The patient was screened for  readmission hospitalization with noted medium risk score for unplanned readmission risk with 1 IP in 6 months.  The patient was assessed for potential [facility]) Care Management service needs for post hospital transition for care coordination. Review of patient's electronic medical record reveals patient was admitted for Sepsis. Liaison attempted outreach call to pt's however unsuccessful. Based upon pt's admitting diagnosis will make a referral for post hospital prevention readmission follow up call to this pt for care management services.  Plan: [facility] Liaison will continue to follow progress and disposition to asess for post hospital community care coordination/management needs.  Referral request for community care coordination:  Will make a referral for care coordination services.   [facility] Care Management/Population Health does not replace or interfere with any arrangements made by the Inpatient Transition of Care team.   For questions contact:   Elliot Cousin, RN, [facility] Liaison [place]   Population Health Office Hours MTWF  8:00 am-6:00 pm [phone] mobile [phone] [Office toll free line] Office Hours are M-F 8:30 - 5 pm [email or web address]

## 2023-08-06 ENCOUNTER — Telehealth: Payer: Self-pay | Admitting: *Deleted

## 2023-08-06 LAB — CULTURE, BLOOD (ROUTINE X 2)
Culture: NO GROWTH
Culture: NO GROWTH
Special Requests: ADEQUATE
Special Requests: ADEQUATE

## 2023-08-06 NOTE — Progress Notes (Signed)
  Care Coordination   Note   08/06/2023 Name: Desiree Madden MRN: [id number] DOB: 04/30/1951  Desiree Madden is a 72 y.o. year old female who sees [facility] for primary care. I reached out to Kurtis Bushman by phone today to offer care coordination services.  [name] was given information about Care Coordination services today including:   The Care Coordination services include support from the care team which includes your Nurse Coordinator, Clinical Social Worker, or Pharmacist.  The Care Coordination team is here to help remove barriers to the health concerns and goals most important to you. Care Coordination services are voluntary, and the patient may decline or stop services at any time by request to their care team member.   Care Coordination Consent Status: Patient agreed to services and verbal consent obtained.   Follow up plan:  Telephone appointment with care coordination team member scheduled for:  08/14/2023  Encounter Outcome:  Pt. Scheduled from referral   Burman Nieves, [facility] Care Coordination Care Guide Direct Dial: [phone]

## 2023-08-12 DIAGNOSIS — Z933 Colostomy status: Secondary | ICD-10-CM | POA: Diagnosis not present

## 2023-08-13 DIAGNOSIS — Z933 Colostomy status: Secondary | ICD-10-CM | POA: Diagnosis not present

## 2023-08-15 ENCOUNTER — Encounter: Payer: Self-pay | Admitting: *Deleted

## 2023-08-15 ENCOUNTER — Ambulatory Visit: Payer: Self-pay | Admitting: *Deleted

## 2023-08-15 NOTE — Patient Outreach (Signed)
  Care Coordination   Follow Up Visit Note   08/17/2023 Name: Desiree Madden MRN: [id number] DOB: [birth year]  Desiree Madden is a 72 y.o. year old female who sees [facility] for primary care. I spoke with  Kurtis Bushman by phone today.  What matters to the patients health and wellness today?  Recovery from recent hospitalization    Goals Addressed             This Visit's Progress    Effective management of chronic conditions       Interventions Today    Flowsheet Row Most Recent Value  Chronic Disease   Chronic disease during today's visit Congestive Heart Failure (CHF), Hypertension (HTN), Atrial Fibrillation (AFib), Other  [recent sepsis related to pneumonia]  General Interventions   General Interventions Discussed/Reviewed General Interventions Reviewed, Doctor Visits, Durable Medical Equipment (DME)  Doctor Visits Discussed/Reviewed Doctor Visits Reviewed, Specialist, PCP  [facility] 9/9, B12 shot 9/16, Nephrology 9/18, Cardiology 10/16]  Durable Medical Equipment (DME) BP Cuff, Walker  PCP/Specialist Visits Compliance with follow-up visit  Exercise Interventions   Exercise Discussed/Reviewed Weight Managment  Weight Management Weight maintenance  Education Interventions   Education Provided Provided Education, Provided Web-based Education  Provided Verbal Education On Nutrition, Medication, When to see the doctor  Nutrition Interventions   Nutrition Discussed/Reviewed Nutrition Discussed, Adding fruits and vegetables, Decreasing salt  Pharmacy Interventions   Pharmacy Dicussed/Reviewed Pharmacy Topics Reviewed, Affording Medications, Referral to Pharmacist  Referral to Pharmacist Cannot afford medications  [Eliquis is expensive]              SDOH assessments and interventions completed:  Yes  SDOH Interventions Today    Flowsheet Row Most Recent Value  SDOH Interventions   Food Insecurity Interventions Intervention Not Indicated   Housing Interventions Intervention Not Indicated  Transportation Interventions Intervention Not Indicated  Utilities Interventions Intervention Not Indicated        Care Coordination Interventions:  Yes, provided   Follow up plan: Follow up call scheduled for 10/1    Encounter Outcome:  Patient Visit Completed   Kemper Durie, RN, MSN, [facility] Care Management Care Management Coordinator [phone]

## 2023-08-17 NOTE — Patient Instructions (Signed)
Visit Information  Thank you for taking time to visit with me today. Please don't hesitate to contact me if I can be of assistance to you before our next scheduled telephone appointment.  Our next appointment is by telephone on 10/1  Please call the care guide team at [phone] if you need to cancel or reschedule your appointment.   Please call the Suicide and Crisis Lifeline: 988 call the [place] National Suicide Prevention Lifeline: 986-653-8271 or TTY: 226 143 9836 TTY 303 690 2412) to talk to a trained counselor call 1-800-273-TALK (toll free, 24 hour hotline) call 911 if you are experiencing a Mental Health or Behavioral Health Crisis or need someone to talk to.  Patient verbalizes understanding of instructions and care plan provided today and agrees to view in MyChart. Active MyChart status and patient understanding of how to access instructions and care plan via MyChart confirmed with patient.     The patient has been provided with contact information for the care management team and has been advised to call with any health related questions or concerns.   Kemper Durie [facility] Care Management Care Management Coordinator [phone]

## 2023-08-18 ENCOUNTER — Telehealth: Payer: Self-pay | Admitting: Pharmacy Technician

## 2023-08-18 DIAGNOSIS — N12 Tubulo-interstitial nephritis, not specified as acute or chronic: Secondary | ICD-10-CM | POA: Diagnosis not present

## 2023-08-18 DIAGNOSIS — Z09 Encounter for follow-up examination after completed treatment for conditions other than malignant neoplasm: Secondary | ICD-10-CM | POA: Diagnosis not present

## 2023-08-18 DIAGNOSIS — I25810 Atherosclerosis of coronary artery bypass graft(s) without angina pectoris: Secondary | ICD-10-CM | POA: Diagnosis not present

## 2023-08-18 DIAGNOSIS — N184 Chronic kidney disease, stage 4 (severe): Secondary | ICD-10-CM | POA: Diagnosis not present

## 2023-08-18 DIAGNOSIS — I4891 Unspecified atrial fibrillation: Secondary | ICD-10-CM | POA: Diagnosis not present

## 2023-08-18 DIAGNOSIS — Z933 Colostomy status: Secondary | ICD-10-CM | POA: Diagnosis not present

## 2023-08-18 DIAGNOSIS — R918 Other nonspecific abnormal finding of lung field: Secondary | ICD-10-CM | POA: Diagnosis not present

## 2023-08-18 DIAGNOSIS — J189 Pneumonia, unspecified organism: Secondary | ICD-10-CM | POA: Diagnosis not present

## 2023-08-18 DIAGNOSIS — J984 Other disorders of lung: Secondary | ICD-10-CM | POA: Diagnosis not present

## 2023-08-18 DIAGNOSIS — Z5986 Financial insecurity: Secondary | ICD-10-CM

## 2023-08-18 NOTE — Progress Notes (Addendum)
  [facility] Customer service manager [facility])  [facility] Quality Pharmacy Team   08/18/2023  Desiree Madden 05-31-1951 [id number]  Reason for referral: Medication assistance  Referral source: [facility] RN Desiree Madden Current insurance: Desiree Madden  Outreach:  Successful telephone call with patient.  HIPAA identifiers verified. Patient informs having affordability issues with Eliquis. Patient informs she is a household of 2 and does not file taxes. The income she reports is within the guidelines of [employer] patient assistance for Eliquis. Patient is not sure she has met the 3% OOP requirement of the program however. She will go to the pharmacy and get a printout for herself and her husband. She reports she has [facility] and the prescriber of the Eliquis is Desiree Madden, [facility], Cardiology in [place], [place]. Will mail an application to the patient.    Medication Assistance Findings:  Medication assistance needs identified: Eliquis  Extra Help:  Not eligible for Extra Help Low Income Subsidy based on reported income and assets  Additional medication assistance options reviewed with patient as warranted:  No other options identified  Plan: I will route patient assistance letter to [facility] pharmacy technician who will coordinate patient assistance program application process for medications listed above.  [facility] pharmacy technician will assist with obtaining all required documents from both patient and provider(s) and submit application(s) once completed.  Thank you for allowing [facility]e [facility]en pharmacy to be a part of this patient's care.   Desiree Madden, CPhT [place]  Office: [phone] Fax: [phone] Email: [email or web address]

## 2023-08-21 ENCOUNTER — Telehealth: Payer: Self-pay | Admitting: Pharmacy Technician

## 2023-08-21 DIAGNOSIS — Z5986 Financial insecurity: Secondary | ICD-10-CM

## 2023-08-21 NOTE — Progress Notes (Signed)
[facility] Customer service manager [facility])                                            [facility] Quality Pharmacy Team    08/21/2023  Maddi Kobes [birth year] [id number]                                      Medication Assistance Referral  Referral From:  [facility] CM RN Kemper Durie  Medication/Company: Eliquis / [employer]MS Patient application portion:  Mailed Provider application portion: Faxed  to Minda Ditto, [facility] Provider address/fax verified via: Office website   Pattricia Boss, CPhT [place]  Office: [phone] Fax: [phone] Email: [email or web address]

## 2023-08-25 DIAGNOSIS — I10 Essential (primary) hypertension: Secondary | ICD-10-CM | POA: Diagnosis not present

## 2023-08-25 DIAGNOSIS — N1832 Chronic kidney disease, stage 3b: Secondary | ICD-10-CM | POA: Diagnosis not present

## 2023-08-25 DIAGNOSIS — Z9889 Other specified postprocedural states: Secondary | ICD-10-CM | POA: Diagnosis not present

## 2023-08-25 DIAGNOSIS — I480 Paroxysmal atrial fibrillation: Secondary | ICD-10-CM | POA: Diagnosis not present

## 2023-08-25 DIAGNOSIS — N2581 Secondary hyperparathyroidism of renal origin: Secondary | ICD-10-CM | POA: Diagnosis not present

## 2023-08-25 DIAGNOSIS — K5732 Diverticulitis of large intestine without perforation or abscess without bleeding: Secondary | ICD-10-CM | POA: Diagnosis not present

## 2023-08-25 DIAGNOSIS — G8929 Other chronic pain: Secondary | ICD-10-CM | POA: Diagnosis not present

## 2023-08-25 DIAGNOSIS — E8722 Chronic metabolic acidosis: Secondary | ICD-10-CM | POA: Diagnosis not present

## 2023-08-25 DIAGNOSIS — E538 Deficiency of other specified B group vitamins: Secondary | ICD-10-CM | POA: Diagnosis not present

## 2023-08-25 DIAGNOSIS — M549 Dorsalgia, unspecified: Secondary | ICD-10-CM | POA: Diagnosis not present

## 2023-08-27 DIAGNOSIS — E8722 Chronic metabolic acidosis: Secondary | ICD-10-CM | POA: Diagnosis not present

## 2023-08-27 DIAGNOSIS — I10 Essential (primary) hypertension: Secondary | ICD-10-CM | POA: Diagnosis not present

## 2023-08-27 DIAGNOSIS — N2581 Secondary hyperparathyroidism of renal origin: Secondary | ICD-10-CM | POA: Diagnosis not present

## 2023-08-27 DIAGNOSIS — N1832 Chronic kidney disease, stage 3b: Secondary | ICD-10-CM | POA: Diagnosis not present

## 2023-09-09 ENCOUNTER — Ambulatory Visit: Payer: Self-pay | Admitting: *Deleted

## 2023-09-09 NOTE — Patient Outreach (Signed)
Care Coordination   09/09/2023 Name: Desiree Madden MRN: [id number] DOB: 11-22-1951   Care Coordination Outreach Attempts:  An unsuccessful telephone outreach was attempted for a scheduled appointment today.  Follow Up Plan:  Additional outreach attempts will be made to offer the patient care coordination information and services.   Encounter Outcome:  No Answer   Care Coordination Interventions:  No, not indicated    Kemper Durie, RN, MSN, [facility] Care Management Care Management Coordinator [phone]

## 2023-09-09 NOTE — Patient Outreach (Signed)
Care Coordination   Follow Up Visit Note   09/09/2023 Name: Desiree Madden MRN: [id number] DOB: 1951-10-17  Desiree Madden is a 72 y.o. year old female who sees [facility] for primary care. I spoke with  Kurtis Bushman by phone today.  What matters to the patients health and wellness today?  Report she has recovered from pneumonia, denies any residual shortness of breath.  Denies any urgent concerns, encouraged to contact this care manager with questions.    Goals Addressed             This Visit's Progress    Effective management of chronic conditions   On track    Interventions Today    Flowsheet Row Most Recent Value  Chronic Disease   Chronic disease during today's visit Congestive Heart Failure (CHF), Hypertension (HTN), Chronic Kidney Disease/End Stage Renal Disease (ESRD), Other  [recovered from pneumonia]  General Interventions   General Interventions Discussed/Reviewed General Interventions Reviewed, Doctor Visits, Vaccines  Vaccines Flu  Doctor Visits Discussed/Reviewed Doctor Visits Reviewed, PCP, Specialist  [10/16 with cardiology (was thinking it was tomorrow, but she will call office to confirm), 10/29 with PCP]  PCP/Specialist Visits Compliance with follow-up visit  Education Interventions   Education Provided Provided Education  Provided Verbal Education On Medication, When to see the doctor                SDOH assessments and interventions completed:  No     Care Coordination Interventions:  Yes, provided   Follow up plan: Follow up call scheduled for 11/5    Encounter Outcome:  Patient Visit Completed   Kemper Durie, RN, MSN, [facility] Care Management Care Management Coordinator [phone]

## 2023-09-15 DIAGNOSIS — Z933 Colostomy status: Secondary | ICD-10-CM | POA: Diagnosis not present

## 2023-09-16 DIAGNOSIS — Z933 Colostomy status: Secondary | ICD-10-CM | POA: Diagnosis not present

## 2023-09-24 DIAGNOSIS — Z951 Presence of aortocoronary bypass graft: Secondary | ICD-10-CM | POA: Diagnosis not present

## 2023-09-24 DIAGNOSIS — I10 Essential (primary) hypertension: Secondary | ICD-10-CM | POA: Diagnosis not present

## 2023-09-24 DIAGNOSIS — I25810 Atherosclerosis of coronary artery bypass graft(s) without angina pectoris: Secondary | ICD-10-CM | POA: Diagnosis not present

## 2023-09-24 DIAGNOSIS — I480 Paroxysmal atrial fibrillation: Secondary | ICD-10-CM | POA: Diagnosis not present

## 2023-09-24 DIAGNOSIS — I5022 Chronic systolic (congestive) heart failure: Secondary | ICD-10-CM | POA: Diagnosis not present

## 2023-09-24 DIAGNOSIS — N183 Chronic kidney disease, stage 3 unspecified: Secondary | ICD-10-CM | POA: Diagnosis not present

## 2023-10-07 ENCOUNTER — Other Ambulatory Visit: Payer: Self-pay | Admitting: Family Medicine

## 2023-10-07 DIAGNOSIS — M8000XG Age-related osteoporosis with current pathological fracture, unspecified site, subsequent encounter for fracture with delayed healing: Secondary | ICD-10-CM | POA: Diagnosis not present

## 2023-10-07 DIAGNOSIS — N2581 Secondary hyperparathyroidism of renal origin: Secondary | ICD-10-CM | POA: Diagnosis not present

## 2023-10-07 DIAGNOSIS — Z1283 Encounter for screening for malignant neoplasm of skin: Secondary | ICD-10-CM | POA: Diagnosis not present

## 2023-10-07 DIAGNOSIS — E782 Mixed hyperlipidemia: Secondary | ICD-10-CM | POA: Diagnosis not present

## 2023-10-07 DIAGNOSIS — Z933 Colostomy status: Secondary | ICD-10-CM | POA: Diagnosis not present

## 2023-10-07 DIAGNOSIS — Z1231 Encounter for screening mammogram for malignant neoplasm of breast: Secondary | ICD-10-CM | POA: Diagnosis not present

## 2023-10-07 DIAGNOSIS — E538 Deficiency of other specified B group vitamins: Secondary | ICD-10-CM | POA: Diagnosis not present

## 2023-10-07 DIAGNOSIS — I5042 Chronic combined systolic (congestive) and diastolic (congestive) heart failure: Secondary | ICD-10-CM | POA: Diagnosis not present

## 2023-10-07 DIAGNOSIS — I10 Essential (primary) hypertension: Secondary | ICD-10-CM | POA: Diagnosis not present

## 2023-10-07 DIAGNOSIS — E039 Hypothyroidism, unspecified: Secondary | ICD-10-CM | POA: Diagnosis not present

## 2023-10-07 DIAGNOSIS — R7303 Prediabetes: Secondary | ICD-10-CM | POA: Diagnosis not present

## 2023-10-07 DIAGNOSIS — N1832 Chronic kidney disease, stage 3b: Secondary | ICD-10-CM | POA: Diagnosis not present

## 2023-10-07 DIAGNOSIS — D631 Anemia in chronic kidney disease: Secondary | ICD-10-CM | POA: Diagnosis not present

## 2023-10-07 DIAGNOSIS — Z0000 Encounter for general adult medical examination without abnormal findings: Secondary | ICD-10-CM | POA: Diagnosis not present

## 2023-10-14 ENCOUNTER — Ambulatory Visit: Payer: Self-pay | Admitting: *Deleted

## 2023-10-14 NOTE — Patient Outreach (Signed)
  Care Coordination   Follow Up Visit Note   10/14/2023 Name: Desiree Madden MRN: [id number] DOB: 29-Sep-1951  Desiree Madden is a 72 y.o. year old female who sees [facility] for primary care. I spoke with  Kurtis Bushman by phone today.  What matters to the patients health and wellness today?  Patient state she is doing well.  Denies any urgent concerns, encouraged to contact this care manager with questions.      Goals Addressed             This Visit's Progress    COMPLETED: Effective management of chronic conditions   On track    Interventions Today    Flowsheet Row Most Recent Value  Chronic Disease   Chronic disease during today's visit Congestive Heart Failure (CHF), Hypertension (HTN), Atrial Fibrillation (AFib), Chronic Kidney Disease/End Stage Renal Disease (ESRD)  General Interventions   General Interventions Discussed/Reviewed General Interventions Reviewed, Doctor Visits, Labs  Labs --  [Report thyroid levels were elevated]  Doctor Visits Discussed/Reviewed PCP, Doctor Visits Reviewed  [upcoming 12/16 with PCP]  PCP/Specialist Visits Compliance with follow-up visit  Education Interventions   Education Provided Provided Education  Provided Verbal Education On Medication, When to see the doctor  [recently diagnosed with UTI taking antibiotics.  Thyroid medication was altered according to thyroid levels]              SDOH assessments and interventions completed:  No     Care Coordination Interventions:  Yes, provided   Follow up plan: No further intervention required.   Encounter Outcome:  Patient Visit Completed   Kemper Durie RN, MSN, CCM [place]  [facility], [facility] RN Care Coordinator Direct Dial: [phone] / Main [phone] Fax [phone] Email: [email or web address]: [email or web address]

## 2023-10-16 DIAGNOSIS — Z933 Colostomy status: Secondary | ICD-10-CM | POA: Diagnosis not present

## 2023-11-17 DIAGNOSIS — Z933 Colostomy status: Secondary | ICD-10-CM | POA: Diagnosis not present

## 2023-11-20 DIAGNOSIS — E039 Hypothyroidism, unspecified: Secondary | ICD-10-CM | POA: Diagnosis not present

## 2023-12-23 DIAGNOSIS — Z933 Colostomy status: Secondary | ICD-10-CM | POA: Diagnosis not present

## 2023-12-30 DIAGNOSIS — N1832 Chronic kidney disease, stage 3b: Secondary | ICD-10-CM | POA: Diagnosis not present

## 2024-01-01 DIAGNOSIS — E039 Hypothyroidism, unspecified: Secondary | ICD-10-CM | POA: Diagnosis not present

## 2024-01-05 DIAGNOSIS — I10 Essential (primary) hypertension: Secondary | ICD-10-CM | POA: Diagnosis not present

## 2024-01-05 DIAGNOSIS — I480 Paroxysmal atrial fibrillation: Secondary | ICD-10-CM | POA: Diagnosis not present

## 2024-01-05 DIAGNOSIS — N2581 Secondary hyperparathyroidism of renal origin: Secondary | ICD-10-CM | POA: Diagnosis not present

## 2024-01-05 DIAGNOSIS — N1832 Chronic kidney disease, stage 3b: Secondary | ICD-10-CM | POA: Diagnosis not present

## 2024-01-05 DIAGNOSIS — E8722 Chronic metabolic acidosis: Secondary | ICD-10-CM | POA: Diagnosis not present

## 2024-01-22 DIAGNOSIS — L820 Inflamed seborrheic keratosis: Secondary | ICD-10-CM | POA: Diagnosis not present

## 2024-01-22 DIAGNOSIS — L578 Other skin changes due to chronic exposure to nonionizing radiation: Secondary | ICD-10-CM | POA: Diagnosis not present

## 2024-01-22 DIAGNOSIS — L821 Other seborrheic keratosis: Secondary | ICD-10-CM | POA: Diagnosis not present

## 2024-01-22 DIAGNOSIS — D2262 Melanocytic nevi of left upper limb, including shoulder: Secondary | ICD-10-CM | POA: Diagnosis not present

## 2024-01-22 DIAGNOSIS — D2261 Melanocytic nevi of right upper limb, including shoulder: Secondary | ICD-10-CM | POA: Diagnosis not present

## 2024-01-22 DIAGNOSIS — D2272 Melanocytic nevi of left lower limb, including hip: Secondary | ICD-10-CM | POA: Diagnosis not present

## 2024-01-22 DIAGNOSIS — D225 Melanocytic nevi of trunk: Secondary | ICD-10-CM | POA: Diagnosis not present

## 2024-01-22 DIAGNOSIS — D2271 Melanocytic nevi of right lower limb, including hip: Secondary | ICD-10-CM | POA: Diagnosis not present

## 2024-01-27 DIAGNOSIS — Z933 Colostomy status: Secondary | ICD-10-CM | POA: Diagnosis not present

## 2024-02-05 DIAGNOSIS — I5022 Chronic systolic (congestive) heart failure: Secondary | ICD-10-CM | POA: Diagnosis not present

## 2024-02-05 DIAGNOSIS — Z951 Presence of aortocoronary bypass graft: Secondary | ICD-10-CM | POA: Diagnosis not present

## 2024-02-12 DIAGNOSIS — I10 Essential (primary) hypertension: Secondary | ICD-10-CM | POA: Diagnosis not present

## 2024-02-26 DIAGNOSIS — Z933 Colostomy status: Secondary | ICD-10-CM | POA: Diagnosis not present

## 2024-03-16 ENCOUNTER — Ambulatory Visit
Admission: RE | Admit: 2024-03-16 | Discharge: 2024-03-16 | Disposition: A | Discharge status: Home / Self Care | Source: Ambulatory Visit | Attending: Family Medicine | Admitting: Family Medicine

## 2024-03-16 DIAGNOSIS — Z1231 Encounter for screening mammogram for malignant neoplasm of breast: Secondary | ICD-10-CM | POA: Insufficient documentation

## 2024-03-25 DIAGNOSIS — R7303 Prediabetes: Secondary | ICD-10-CM | POA: Diagnosis not present

## 2024-03-25 DIAGNOSIS — I480 Paroxysmal atrial fibrillation: Secondary | ICD-10-CM | POA: Diagnosis not present

## 2024-03-25 DIAGNOSIS — E782 Mixed hyperlipidemia: Secondary | ICD-10-CM | POA: Diagnosis not present

## 2024-03-25 DIAGNOSIS — I5042 Chronic combined systolic (congestive) and diastolic (congestive) heart failure: Secondary | ICD-10-CM | POA: Diagnosis not present

## 2024-03-25 DIAGNOSIS — Z951 Presence of aortocoronary bypass graft: Secondary | ICD-10-CM | POA: Diagnosis not present

## 2024-03-25 DIAGNOSIS — I10 Essential (primary) hypertension: Secondary | ICD-10-CM | POA: Diagnosis not present

## 2024-03-25 DIAGNOSIS — K5190 Ulcerative colitis, unspecified, without complications: Secondary | ICD-10-CM | POA: Diagnosis not present

## 2024-03-25 DIAGNOSIS — I25810 Atherosclerosis of coronary artery bypass graft(s) without angina pectoris: Secondary | ICD-10-CM | POA: Diagnosis not present

## 2024-03-29 DIAGNOSIS — Z933 Colostomy status: Secondary | ICD-10-CM | POA: Diagnosis not present

## 2024-04-06 DIAGNOSIS — Z951 Presence of aortocoronary bypass graft: Secondary | ICD-10-CM | POA: Diagnosis not present

## 2024-04-06 DIAGNOSIS — M8000XD Age-related osteoporosis with current pathological fracture, unspecified site, subsequent encounter for fracture with routine healing: Secondary | ICD-10-CM | POA: Diagnosis not present

## 2024-04-06 DIAGNOSIS — D631 Anemia in chronic kidney disease: Secondary | ICD-10-CM | POA: Diagnosis not present

## 2024-04-06 DIAGNOSIS — N189 Chronic kidney disease, unspecified: Secondary | ICD-10-CM | POA: Diagnosis not present

## 2024-04-06 DIAGNOSIS — I25810 Atherosclerosis of coronary artery bypass graft(s) without angina pectoris: Secondary | ICD-10-CM | POA: Diagnosis not present

## 2024-04-06 DIAGNOSIS — G894 Chronic pain syndrome: Secondary | ICD-10-CM | POA: Diagnosis not present

## 2024-04-06 DIAGNOSIS — E782 Mixed hyperlipidemia: Secondary | ICD-10-CM | POA: Diagnosis not present

## 2024-04-06 DIAGNOSIS — E039 Hypothyroidism, unspecified: Secondary | ICD-10-CM | POA: Diagnosis not present

## 2024-04-06 DIAGNOSIS — I480 Paroxysmal atrial fibrillation: Secondary | ICD-10-CM | POA: Diagnosis not present

## 2024-04-06 DIAGNOSIS — N2581 Secondary hyperparathyroidism of renal origin: Secondary | ICD-10-CM | POA: Diagnosis not present

## 2024-04-12 DIAGNOSIS — E8722 Chronic metabolic acidosis: Secondary | ICD-10-CM | POA: Diagnosis not present

## 2024-04-12 DIAGNOSIS — I10 Essential (primary) hypertension: Secondary | ICD-10-CM | POA: Diagnosis not present

## 2024-04-12 DIAGNOSIS — N1832 Chronic kidney disease, stage 3b: Secondary | ICD-10-CM | POA: Diagnosis not present

## 2024-04-12 DIAGNOSIS — R300 Dysuria: Secondary | ICD-10-CM | POA: Diagnosis not present

## 2024-04-12 DIAGNOSIS — N2581 Secondary hyperparathyroidism of renal origin: Secondary | ICD-10-CM | POA: Diagnosis not present

## 2024-04-12 DIAGNOSIS — D631 Anemia in chronic kidney disease: Secondary | ICD-10-CM | POA: Diagnosis not present

## 2024-04-21 DIAGNOSIS — N390 Urinary tract infection, site not specified: Secondary | ICD-10-CM | POA: Diagnosis not present

## 2024-04-28 DIAGNOSIS — Z933 Colostomy status: Secondary | ICD-10-CM | POA: Diagnosis not present

## 2024-05-07 DIAGNOSIS — E039 Hypothyroidism, unspecified: Secondary | ICD-10-CM | POA: Diagnosis not present

## 2024-05-31 DIAGNOSIS — Z933 Colostomy status: Secondary | ICD-10-CM | POA: Diagnosis not present

## 2024-06-17 DIAGNOSIS — E039 Hypothyroidism, unspecified: Secondary | ICD-10-CM | POA: Diagnosis not present

## 2024-07-02 DIAGNOSIS — Z933 Colostomy status: Secondary | ICD-10-CM | POA: Diagnosis not present

## 2024-07-20 ENCOUNTER — Inpatient Hospital Stay
Admission: EM | Admit: 2024-07-20 | Discharge: 2024-07-23 | DRG: 481 | Disposition: A | Discharge status: Home Health | Attending: Obstetrics and Gynecology | Admitting: Obstetrics and Gynecology

## 2024-07-20 ENCOUNTER — Emergency Department

## 2024-07-20 ENCOUNTER — Inpatient Hospital Stay

## 2024-07-20 ENCOUNTER — Other Ambulatory Visit: Payer: Self-pay

## 2024-07-20 DIAGNOSIS — S81812A Laceration without foreign body, left lower leg, initial encounter: Secondary | ICD-10-CM

## 2024-07-20 DIAGNOSIS — Z79890 Hormone replacement therapy (postmenopausal): Secondary | ICD-10-CM | POA: Diagnosis not present

## 2024-07-20 DIAGNOSIS — W010XXA Fall on same level from slipping, tripping and stumbling without subsequent striking against object, initial encounter: Secondary | ICD-10-CM | POA: Diagnosis present

## 2024-07-20 DIAGNOSIS — I482 Chronic atrial fibrillation, unspecified: Secondary | ICD-10-CM | POA: Diagnosis present

## 2024-07-20 DIAGNOSIS — E8722 Chronic metabolic acidosis: Secondary | ICD-10-CM | POA: Diagnosis present

## 2024-07-20 DIAGNOSIS — Z888 Allergy status to other drugs, medicaments and biological substances status: Secondary | ICD-10-CM | POA: Diagnosis not present

## 2024-07-20 DIAGNOSIS — Z8249 Family history of ischemic heart disease and other diseases of the circulatory system: Secondary | ICD-10-CM | POA: Diagnosis not present

## 2024-07-20 DIAGNOSIS — S72002A Fracture of unspecified part of neck of left femur, initial encounter for closed fracture: Secondary | ICD-10-CM | POA: Diagnosis not present

## 2024-07-20 DIAGNOSIS — M47816 Spondylosis without myelopathy or radiculopathy, lumbar region: Secondary | ICD-10-CM | POA: Diagnosis not present

## 2024-07-20 DIAGNOSIS — E875 Hyperkalemia: Secondary | ICD-10-CM | POA: Diagnosis present

## 2024-07-20 DIAGNOSIS — Y92000 Kitchen of unspecified non-institutional (private) residence as  the place of occurrence of the external cause: Secondary | ICD-10-CM | POA: Diagnosis not present

## 2024-07-20 DIAGNOSIS — Z7901 Long term (current) use of anticoagulants: Secondary | ICD-10-CM

## 2024-07-20 DIAGNOSIS — Z933 Colostomy status: Secondary | ICD-10-CM | POA: Diagnosis not present

## 2024-07-20 DIAGNOSIS — N1832 Chronic kidney disease, stage 3b: Secondary | ICD-10-CM | POA: Diagnosis present

## 2024-07-20 DIAGNOSIS — Z951 Presence of aortocoronary bypass graft: Secondary | ICD-10-CM

## 2024-07-20 DIAGNOSIS — Z79899 Other long term (current) drug therapy: Secondary | ICD-10-CM

## 2024-07-20 DIAGNOSIS — I2510 Atherosclerotic heart disease of native coronary artery without angina pectoris: Secondary | ICD-10-CM | POA: Diagnosis present

## 2024-07-20 DIAGNOSIS — M85862 Other specified disorders of bone density and structure, left lower leg: Secondary | ICD-10-CM | POA: Diagnosis not present

## 2024-07-20 DIAGNOSIS — I5042 Chronic combined systolic (congestive) and diastolic (congestive) heart failure: Secondary | ICD-10-CM | POA: Diagnosis present

## 2024-07-20 DIAGNOSIS — M25552 Pain in left hip: Secondary | ICD-10-CM | POA: Diagnosis not present

## 2024-07-20 DIAGNOSIS — E039 Hypothyroidism, unspecified: Secondary | ICD-10-CM | POA: Diagnosis not present

## 2024-07-20 DIAGNOSIS — I480 Paroxysmal atrial fibrillation: Secondary | ICD-10-CM | POA: Diagnosis not present

## 2024-07-20 DIAGNOSIS — Z87891 Personal history of nicotine dependence: Secondary | ICD-10-CM

## 2024-07-20 DIAGNOSIS — M5032 Other cervical disc degeneration, mid-cervical region, unspecified level: Secondary | ICD-10-CM | POA: Diagnosis not present

## 2024-07-20 DIAGNOSIS — S72145A Nondisplaced intertrochanteric fracture of left femur, initial encounter for closed fracture: Principal | ICD-10-CM | POA: Diagnosis present

## 2024-07-20 DIAGNOSIS — Z9049 Acquired absence of other specified parts of digestive tract: Secondary | ICD-10-CM

## 2024-07-20 DIAGNOSIS — S72112A Displaced fracture of greater trochanter of left femur, initial encounter for closed fracture: Secondary | ICD-10-CM | POA: Diagnosis not present

## 2024-07-20 DIAGNOSIS — Z886 Allergy status to analgesic agent status: Secondary | ICD-10-CM

## 2024-07-20 DIAGNOSIS — M160 Bilateral primary osteoarthritis of hip: Secondary | ICD-10-CM | POA: Diagnosis not present

## 2024-07-20 DIAGNOSIS — Z681 Body mass index (BMI) 19 or less, adult: Secondary | ICD-10-CM

## 2024-07-20 DIAGNOSIS — S81012A Laceration without foreign body, left knee, initial encounter: Secondary | ICD-10-CM | POA: Diagnosis present

## 2024-07-20 DIAGNOSIS — W19XXXA Unspecified fall, initial encounter: Secondary | ICD-10-CM | POA: Diagnosis not present

## 2024-07-20 DIAGNOSIS — Z882 Allergy status to sulfonamides status: Secondary | ICD-10-CM | POA: Diagnosis not present

## 2024-07-20 DIAGNOSIS — Z043 Encounter for examination and observation following other accident: Secondary | ICD-10-CM | POA: Diagnosis not present

## 2024-07-20 DIAGNOSIS — G9389 Other specified disorders of brain: Secondary | ICD-10-CM | POA: Diagnosis not present

## 2024-07-20 DIAGNOSIS — R519 Headache, unspecified: Secondary | ICD-10-CM | POA: Diagnosis not present

## 2024-07-20 DIAGNOSIS — I130 Hypertensive heart and chronic kidney disease with heart failure and stage 1 through stage 4 chronic kidney disease, or unspecified chronic kidney disease: Secondary | ICD-10-CM | POA: Diagnosis present

## 2024-07-20 DIAGNOSIS — E440 Moderate protein-calorie malnutrition: Secondary | ICD-10-CM | POA: Diagnosis present

## 2024-07-20 DIAGNOSIS — I6529 Occlusion and stenosis of unspecified carotid artery: Secondary | ICD-10-CM | POA: Diagnosis not present

## 2024-07-20 DIAGNOSIS — I10 Essential (primary) hypertension: Secondary | ICD-10-CM | POA: Diagnosis present

## 2024-07-20 DIAGNOSIS — J9811 Atelectasis: Secondary | ICD-10-CM | POA: Diagnosis not present

## 2024-07-20 DIAGNOSIS — S72009A Fracture of unspecified part of neck of unspecified femur, initial encounter for closed fracture: Secondary | ICD-10-CM | POA: Diagnosis not present

## 2024-07-20 DIAGNOSIS — F411 Generalized anxiety disorder: Secondary | ICD-10-CM | POA: Diagnosis present

## 2024-07-20 DIAGNOSIS — D62 Acute posthemorrhagic anemia: Secondary | ICD-10-CM | POA: Diagnosis not present

## 2024-07-20 DIAGNOSIS — M4802 Spinal stenosis, cervical region: Secondary | ICD-10-CM | POA: Diagnosis not present

## 2024-07-20 DIAGNOSIS — S72125A Nondisplaced fracture of lesser trochanter of left femur, initial encounter for closed fracture: Secondary | ICD-10-CM | POA: Diagnosis not present

## 2024-07-20 LAB — CBC
HCT: 32.1 % — ABNORMAL LOW (ref 36.0–46.0)
Hemoglobin: 10.1 g/dL — ABNORMAL LOW (ref 12.0–15.0)
MCH: 28.3 pg (ref 26.0–34.0)
MCHC: 31.5 g/dL (ref 30.0–36.0)
MCV: 89.9 fL (ref 80.0–100.0)
Platelets: 176 K/uL (ref 150–400)
RBC: 3.57 MIL/uL — ABNORMAL LOW (ref 3.87–5.11)
RDW: 13 % (ref 11.5–15.5)
WBC: 9.7 K/uL (ref 4.0–10.5)
nRBC: 0 % (ref 0.0–0.2)

## 2024-07-20 LAB — BASIC METABOLIC PANEL WITH GFR
Anion gap: 9 (ref 5–15)
BUN: 18 mg/dL (ref 8–23)
CO2: 21 mmol/L — ABNORMAL LOW (ref 22–32)
Calcium: 8.9 mg/dL (ref 8.9–10.3)
Chloride: 108 mmol/L (ref 98–111)
Creatinine, Ser: 1.83 mg/dL — ABNORMAL HIGH (ref 0.44–1.00)
GFR, Estimated: 29 mL/min — ABNORMAL LOW (ref >60)
Glucose, Bld: 80 mg/dL (ref 70–99)
Potassium: 5.3 mmol/L — ABNORMAL HIGH (ref 3.5–5.1)
Sodium: 138 mmol/L (ref 135–145)

## 2024-07-20 LAB — VITAMIN D 25 HYDROXY (VIT D DEFICIENCY, FRACTURES): Vit D, 25-Hydroxy: 52.36 ng/mL (ref 30–100)

## 2024-07-20 LAB — PROTIME-INR
INR: 1.2 (ref 0.8–1.2)
Prothrombin Time: 15.4 s — ABNORMAL HIGH (ref 11.4–15.2)

## 2024-07-20 MED ORDER — FENTANYL CITRATE PF 50 MCG/ML IJ SOSY: 12.5000 ug | PREFILLED_SYRINGE | INTRAMUSCULAR | Status: DC | PRN

## 2024-07-20 MED ORDER — OXYCODONE-ACETAMINOPHEN 5-325 MG PO TABS
2.0000 | ORAL_TABLET | ORAL | Status: AC
  Administered 2024-07-20 (×2): 2 via ORAL
  Filled 2024-07-20: qty 2

## 2024-07-20 MED ORDER — MORPHINE SULFATE (PF) 2 MG/ML IV SOLN
0.5000 mg | INTRAVENOUS | Status: DC | PRN
  Administered 2024-07-20 – 2024-07-21 (×8): 0.5 mg via INTRAVENOUS
  Filled 2024-07-20 (×4): qty 1

## 2024-07-20 MED ORDER — MIRTAZAPINE 15 MG PO TABS
15.0000 mg | ORAL_TABLET | Freq: Every day | ORAL | Status: DC
  Administered 2024-07-20 – 2024-07-22 (×5): 15 mg via ORAL
  Filled 2024-07-20 (×3): qty 1

## 2024-07-20 MED ORDER — ALPRAZOLAM 0.25 MG PO TABS: 0.2500 mg | ORAL_TABLET | Freq: Two times a day (BID) | ORAL | Status: DC | PRN

## 2024-07-20 MED ORDER — OXYCODONE HCL 5 MG PO TABS
5.0000 mg | ORAL_TABLET | Freq: Two times a day (BID) | ORAL | Status: DC | PRN
  Administered 2024-07-20 (×2): 5 mg via ORAL
  Filled 2024-07-20: qty 1

## 2024-07-20 MED ORDER — SENNOSIDES-DOCUSATE SODIUM 8.6-50 MG PO TABS: 1.0000 | ORAL_TABLET | Freq: Every evening | ORAL | Status: DC | PRN

## 2024-07-20 MED ORDER — ALBUTEROL SULFATE (2.5 MG/3ML) 0.083% IN NEBU: 2.5000 mg | INHALATION_SOLUTION | RESPIRATORY_TRACT | Status: DC | PRN

## 2024-07-20 MED ORDER — NITROGLYCERIN 0.4 MG SL SUBL: 0.4000 mg | SUBLINGUAL_TABLET | SUBLINGUAL | Status: DC | PRN

## 2024-07-20 MED ORDER — ACETAMINOPHEN 325 MG PO TABS: 650.0000 mg | ORAL_TABLET | Freq: Four times a day (QID) | ORAL | Status: DC | PRN

## 2024-07-20 MED ORDER — LISINOPRIL 5 MG PO TABS: 5.0000 mg | ORAL_TABLET | Freq: Every day | ORAL | Status: DC

## 2024-07-20 MED ORDER — SODIUM CHLORIDE 0.9 % IV SOLN: INTRAVENOUS | Status: DC

## 2024-07-20 MED ORDER — ACETAMINOPHEN 650 MG RE SUPP: 650.0000 mg | Freq: Four times a day (QID) | RECTAL | Status: DC | PRN

## 2024-07-20 MED ORDER — SODIUM BICARBONATE 650 MG PO TABS
650.0000 mg | ORAL_TABLET | Freq: Every day | ORAL | Status: DC
  Administered 2024-07-22 – 2024-07-23 (×2): 650 mg via ORAL
  Filled 2024-07-20 (×3): qty 1

## 2024-07-20 MED ORDER — VITAMIN C 500 MG PO TABS
500.0000 mg | ORAL_TABLET | Freq: Two times a day (BID) | ORAL | Status: DC
  Administered 2024-07-20 (×2): 500 mg via ORAL
  Filled 2024-07-20: qty 1

## 2024-07-20 MED ORDER — ATORVASTATIN CALCIUM 20 MG PO TABS
20.0000 mg | ORAL_TABLET | Freq: Every day | ORAL | Status: DC
  Administered 2024-07-20 – 2024-07-22 (×5): 20 mg via ORAL
  Filled 2024-07-20 (×3): qty 1

## 2024-07-20 MED ORDER — ONDANSETRON HCL 4 MG PO TABS
4.0000 mg | ORAL_TABLET | Freq: Four times a day (QID) | ORAL | Status: DC | PRN
Start: 2024-07-20 — End: 2024-07-21

## 2024-07-20 MED ORDER — LEVOTHYROXINE SODIUM 50 MCG PO TABS
75.0000 ug | ORAL_TABLET | Freq: Every day | ORAL | Status: DC
  Administered 2024-07-22 – 2024-07-23 (×2): 75 ug via ORAL
  Filled 2024-07-20 (×2): qty 1

## 2024-07-20 MED ORDER — CEFAZOLIN SODIUM-DEXTROSE 2-4 GM/100ML-% IV SOLN: 2.0000 g | INTRAVENOUS | Status: DC

## 2024-07-20 MED ORDER — ONDANSETRON HCL 4 MG/2ML IJ SOLN
4.0000 mg | Freq: Four times a day (QID) | INTRAMUSCULAR | Status: DC | PRN
Start: 2024-07-20 — End: 2024-07-21

## 2024-07-20 MED ORDER — ACETAMINOPHEN 325 MG PO TABS: 325.0000 mg | ORAL_TABLET | ORAL | Status: DC | PRN

## 2024-07-20 MED ORDER — PANTOPRAZOLE SODIUM 40 MG PO TBEC
40.0000 mg | DELAYED_RELEASE_TABLET | Freq: Every day | ORAL | Status: DC
  Administered 2024-07-22 – 2024-07-23 (×2): 40 mg via ORAL
  Filled 2024-07-20 (×2): qty 1

## 2024-07-20 MED ORDER — METOPROLOL SUCCINATE ER 25 MG PO TB24
25.0000 mg | ORAL_TABLET | Freq: Every day | ORAL | Status: DC
  Administered 2024-07-22 – 2024-07-23 (×2): 25 mg via ORAL
  Filled 2024-07-20 (×2): qty 1

## 2024-07-20 NOTE — H&P (Signed)
 History and Physical    Desiree Madden FMW:[id number] DOB: 08/05/1951 DOA: 07/20/2024  PCP: Desiree Madden  Patient coming from: home  I have personally briefly reviewed patient's old medical records in [facility]  Chief Complaint: mechanical fall left hip pain   HPI: Desiree Madden is a 73 y.o. female with medical history significant of Hypertension, hypothyroidism, CAD s/p CABG, CKDIIIb, Chronic metabolic acidosis , Paroxysmal atrial fibrillation on Eliquis .  Patient presents to ED after tripping over husbands walker this am while attempting to prepare breakfast.  Patient note no head strike no LOC  with fall. She notes currently her left hip is painful with movement but at rest  patient notes no significant pain. ON ros she notes no n/v/d/ chest pain / sob Desiree Madden or dysuria.    ED Course:  Afeb, bp 155/83, rr 17  hr 69 sat 100%   Xray FINDINGS: There is no evidence of hip fracture or dislocation. There is no evidence of arthropathy or other focal bone abnormality.   IMPRESSION: Negative.  CTH/CT cervical spine Negative   MRI Left Hip IMPRESSION: 1. Nondisplaced intertrochanteric fracture of the left proximal femur with associated marrow edema and hypointense fracture line extending through the lateral facet of the left greater trochanter. Gluteal cuff insertions appear intact. 2. Mild osteoarthritis of the bilateral hips.  Tx oxycodone  Review of Systems: As per HPI otherwise 10 point review of systems negative.   Past Medical History:  Diagnosis Date   Hypertension    Thyroid  disease     Past Surgical History:  Procedure Laterality Date   CARDIAC SURGERY     COLECTOMY WITH COLOSTOMY CREATION/HARTMANN PROCEDURE Left 03/01/2021   Procedure: COLECTOMY WITH COLOSTOMY CREATION/HARTMANN PROCEDURE;  Surgeon: Desiree Shope, MD;  Location: [facility] ORS;  Service: General;  Laterality: Left;   COLOSTOMY REVISION N/A 03/09/2021   Procedure:  COLOSTOMY REVISION;  Surgeon: Desiree Shope, MD;  Location: [facility] ORS;  Service: General;  Laterality: N/A;   TRACHEOSTOMY TUBE PLACEMENT N/A 03/08/2021   Procedure: TRACHEOSTOMY;  Surgeon: Desiree Mt, MD;  Location: [facility] ORS;  Service: ENT;  Laterality: N/A;     reports that she has quit smoking. She has never used smokeless tobacco. She reports that she does not currently use alcohol. She reports that she does not use drugs.  Allergies  Allergen Reactions   Cymbalta  [Duloxetine  Hcl] Nausea And Vomiting   Baclofen     Other reaction(s): Headache Other reaction(s): Headache, Other (See Comments) Other reaction(s): Headache Other reaction(s): Headache    Sertraline     Other reaction(s): Other (See Comments), Other (See Comments) Muscle aches Muscle aches  Other reaction(s): Other (See Comments), Other (See Comments) Muscle aches Other reaction(s): Other (See Comments), Other (See Comments) Muscle aches Muscle aches Muscle aches Muscle aches Other reaction(s): Other (See Comments), Other (See Comments) Muscle aches Muscle aches    Sulfa Antibiotics     Other reaction(s): Other (See Comments), Other (See Comments) Pt was told not to take it due to colitis Pt was told not to take it due to colitis    Ibuprofen Nausea And Vomiting    Other reaction(s): Other (See Comments) Due to stage 3 kidney disease    Mirtazapine      Other reaction(s): Other (See Comments), Other (See Comments) irritability irritability    Trazodone  Diarrhea   Trazodone  And Nefazodone     Family History  Problem Relation Age of Onset   Arthritis Other    CAD Mother  Prior to Admission medications   Medication Sig Start Date End Date Taking? Authorizing Provider  acetaminophen  (TYLENOL ) 325 MG tablet Take 1-2 tablets (325-650 mg total) by mouth every 4 (four) hours as needed for mild pain. 04/13/21  Yes Love, Sharlet RAMAN, PA-C  ALPRAZolam  (XANAX ) 0.25 MG tablet Take 1 tablet (0.25 mg total)  by mouth 2 (two) times daily as needed for anxiety or sleep. 04/16/21  Yes Angiulli, Toribio PARAS, PA-C  ascorbic acid  (VITAMIN C ) 500 MG tablet Take 1 tablet (500 mg total) by mouth 2 (two) times daily. 04/16/21  Yes Love, Sharlet RAMAN, PA-C  atorvastatin  (LIPITOR) 20 MG tablet Take 1 tablet (20 mg total) by mouth daily. 04/16/21  Yes Love, Sharlet RAMAN, PA-C  cetirizine (ZYRTEC) 10 MG tablet cetirizine 10 mg tablet  Take 1 tablet every day by oral route.   Yes [provider]  chlorhexidine  gluconate, MEDLINE KIT, (PERIDEX ) 0.12 % solution 15 mLs by Mouth Rinse route 2 (two) times daily. 04/16/21  Yes Love, Sharlet RAMAN, PA-C  ELIQUIS  2.5 MG TABS tablet Take 2.5 mg by mouth 2 (two) times daily.   Yes [provider]  ferrous sulfate 325 (65 FE) MG EC tablet Take 325 mg by mouth.  Take 325 mg by mouth every other day 01/05/24  Yes [provider]  fluticasone  (FLONASE ) 50 MCG/ACT nasal spray Place 2 sprays into both nostrils daily.   Yes [provider]  levothyroxine  (SYNTHROID ) 75 MCG tablet Take 75 mcg by mouth daily.   Yes [provider]  lisinopril  (ZESTRIL ) 5 MG tablet Take 5 mg by mouth daily.   Yes [provider]  metoprolol  succinate (TOPROL -XL) 25 MG 24 hr tablet Take 25 mg by mouth daily.   Yes [provider]  mirtazapine  (REMERON ) 15 MG tablet Take 1 tablet (15 mg total) by mouth at bedtime. 04/16/21  Yes Love, Sharlet RAMAN, PA-C  oxyCODONE  (OXY IR/ROXICODONE ) 5 MG immediate release tablet Take 1 tablet (5 mg total) by mouth 2 (two) times daily as needed for moderate pain or severe pain. 11/29/21  Yes Lovorn, Megan, MD  pantoprazole  (PROTONIX ) 40 MG tablet Take 1 tablet (40 mg total) by mouth daily. 04/16/21  Yes Love, Sharlet RAMAN, PA-C  sodium bicarbonate  650 MG tablet Take 650 mg by mouth daily. 05/21/23  Yes [provider]  albuterol  (VENTOLIN  HFA) 108 (90 Base) MCG/ACT inhaler albuterol  sulfate HFA 90 mcg/actuation aerosol inhaler  Inhale 2  puffs every 4 hours by inhalation route. Patient not taking: Reported on 07/20/2024    [provider]  azelastine  (ASTELIN ) 0.1 % nasal spray azelastine  137 mcg (0.1 %) nasal spray aerosol    [provider]  calcitRIOL (ROCALTROL) 0.25 MCG capsule Take 0.25 mcg by mouth.  Take 1 capsule (0.25 mcg total) by mouth 1 (one) time each day Patient not taking: Reported on 07/20/2024 08/27/23 08/26/24  [provider]  ciclopirox (PENLAC) 8 % solution Apply 1 Application topically at bedtime. 07/03/23   [provider]  Multiple Vitamin (MULTIVITAMIN WITH MINERALS) TABS tablet Take 1 tablet by mouth daily. Patient not taking: Reported on 07/20/2024 04/04/21   Fausto Sor A, DO  nitroGLYCERIN  (NITROSTAT ) 0.4 MG SL tablet Place under the tongue. 04/20/21 08/01/23  [provider]  Ostomy Supplies (NEW IMAGE DRAINABLE POUCH ) Pouch MISC Use 1 Bag 2 (two) times daily 04/20/21   [provider]  simethicone (MYLICON) 80 MG chewable tablet Chew 80 mg by mouth every 6 (six) hours  as needed for flatulence. Patient not taking: Reported on 07/20/2024    [provider]    Physical Exam: Vitals:   07/20/24 0854 07/20/24 0855 07/20/24 1030 07/20/24 1430  BP: (!) 155/83  127/67 120/74  Pulse: 69  64 66  Resp: 17  18 17   Temp: 98.3 F (36.8 C)  98.1 F (36.7 C) 98 F (36.7 C)  TempSrc: Oral  Oral Oral  SpO2: 100%  100% 100%  Weight:  52.2 kg    Height:  5' 4 (1.626 m)      Constitutional: NAD, calm, comfortable Vitals:   07/20/24 0854 07/20/24 0855 07/20/24 1030 07/20/24 1430  BP: (!) 155/83  127/67 120/74  Pulse: 69  64 66  Resp: 17  18 17   Temp: 98.3 F (36.8 C)  98.1 F (36.7 C) 98 F (36.7 C)  TempSrc: Oral  Oral Oral  SpO2: 100%  100% 100%  Weight:  52.2 kg    Height:  5' 4 (1.626 m)     Eyes: PERRL, lids and conjunctivae normal ENMT: Mucous membranes are moist.  Neck: normal, supple, no masses, no thyromegaly Respiratory:  clear to auscultation bilaterally, no wheezing, no crackles. Normal respiratory effort. No accessory muscle use.  Cardiovascular: Regular rate and rhythm, no murmurs / rubs / gallops. No extremity edema. 2+ pedal pulses. Abdomen: no tenderness, no masses palpated. No hepatosplenomegaly. Bowel sounds positive.  Musculoskeletal: no clubbing / cyanosis. No joint deformity upper and lower extremities. Decrease ROM on the left, no contractures. Normal muscle tone.  Skin: no rashes, lesions, ulcers. No induration Neurologic: CN grossly intact. Sensation intact, Strength 5/5 in all 4.  Psychiatric: Normal judgment and insight. Alert and oriented x 3. Normal mood.    Labs on Admission: I have personally reviewed following labs and imaging studies  CBC: Recent Labs  Lab 07/20/24 1346  WBC 9.7  HGB 10.1*  HCT 32.1*  MCV 89.9  PLT 176   Basic Metabolic Panel: Recent Labs  Lab 07/20/24 1346  NA 138  K 5.3*  CL 108  CO2 21*  GLUCOSE 80  BUN 18  CREATININE 1.83*  CALCIUM  8.9   GFR: Estimated Creatinine Clearance: 22.6 mL/min (A) (by C-G formula based on SCr of 1.83 mg/dL (H)). Liver Function Tests: No results for input(s): AST, ALT, ALKPHOS, BILITOT, PROT, ALBUMIN  in the last 168 hours. No results for input(s): LIPASE, AMYLASE in the last 168 hours. No results for input(s): AMMONIA in the last 168 hours. Coagulation Profile: Recent Labs  Lab 07/20/24 1346  INR 1.2   Cardiac Enzymes: No results for input(s): CKTOTAL, CKMB, CKMBINDEX, TROPONINI in the last 168 hours. BNP (last 3 results) No results for input(s): PROBNP in the last 8760 hours. HbA1C: No results for input(s): HGBA1C in the last 72 hours. CBG: No results for input(s): GLUCAP in the last 168 hours. Lipid Profile: No results for input(s): CHOL, HDL, LDLCALC, TRIG, CHOLHDL, LDLDIRECT in the last 72 hours. Thyroid  Function Tests: No results for input(s): TSH, T4TOTAL,  FREET4, T3FREE, THYROIDAB in the last 72 hours. Anemia Panel: No results for input(s): VITAMINB12, FOLATE, FERRITIN, TIBC, IRON, RETICCTPCT in the last 72 hours. Urine analysis:    Component Value Date/Time   COLORURINE YELLOW (A) 08/01/2023 1033   APPEARANCEUR CLOUDY (A) 08/01/2023 1033   LABSPEC 1.006 08/01/2023 1033   PHURINE 6.0 08/01/2023 1033   GLUCOSEU NEGATIVE 08/01/2023 1033   HGBUR LARGE (A) 08/01/2023 1033   BILIRUBINUR NEGATIVE 08/01/2023 1033   KETONESUR NEGATIVE  08/01/2023 1033   PROTEINUR NEGATIVE 08/01/2023 1033   NITRITE NEGATIVE 08/01/2023 1033   LEUKOCYTESUR LARGE (A) 08/01/2023 1033    Radiological Exams on Admission: MR HIP LEFT WO CONTRAST Result Date: 07/20/2024 CLINICAL DATA:  Fracture, hip EXAM: MR OF THE LEFT HIP WITHOUT CONTRAST TECHNIQUE: Multiplanar, multisequence MR imaging was performed. No intravenous contrast was administered. COMPARISON:  Left hip radiographs dated 07/20/2024. CT abdomen/pelvis dated 08/01/2023. FINDINGS: Bones/Hip: Nondisplaced intertrochanteric fracture of the left proximal femur with associated marrow edema and hypointense fracture line extending through the lateral facet of the left greater trochanter (series 8 and 9, image 22). No dislocation. The left hip joint is anatomically aligned. Mild superolateral joint space narrowing and marginal osteophytosis. The labrum is grossly intact, although evaluation is limited by lack of intra-articular fluid/contrast. No appreciable focal chondral defect. Ligamentum teres and transverse ligaments are intact. No hip joint effusion. Similar relatively symmetric degenerative changes of the right hip also noted. The remainder of the visualized bones demonstrate normal marrow signal intensity. SI joints and pubic symphysis are anatomically aligned. Degenerative changes of the visualized lower lumbar spine. Soft tissue and Muscle: Subcutaneous edema overlying the posterolateral left hip.  Edema of the left quadratus femoris muscle surrounding the nondisplaced left proximal femoral intertrochanteric fracture. Gluteal cuff insertions appear intact. Hamstring tendon origins are intact.Visualized intrapelvic contents are grossly unremarkable. IMPRESSION: 1. Nondisplaced intertrochanteric fracture of the left proximal femur with associated marrow edema and hypointense fracture line extending through the lateral facet of the left greater trochanter. Gluteal cuff insertions appear intact. 2. Mild osteoarthritis of the bilateral hips. Electronically Signed   By: Harrietta Sherry M.D.   On: 07/20/2024 13:07   CT Cervical Spine Wo Contrast Result Date: 07/20/2024 CLINICAL DATA:  73 year old female status post fall on Eliquis . Pain. EXAM: CT CERVICAL SPINE WITHOUT CONTRAST TECHNIQUE: Multidetector CT imaging of the cervical spine was performed without intravenous contrast. Multiplanar CT image reconstructions were also generated. RADIATION DOSE REDUCTION: This exam was performed according to the departmental dose-optimization program which includes automated exposure control, adjustment of the mA and/or kV according to patient size and/or use of iterative reconstruction technique. COMPARISON:  Head CT today.  Thoracic spine MRI 02/22/2022. FINDINGS: Alignment: Straightening of cervical lordosis similar to the previous MRI. Mild degenerative appearing anterolisthesis of C4 on C5. Cervicothoracic junction alignment is within normal limits. Bilateral posterior element alignment is within normal limits. Skull base and vertebrae: Visualized skull base is intact. No atlanto-occipital dissociation. C1 and C2 appear intact and aligned. No acute osseous abnormality identified. Soft tissues and spinal canal: No prevertebral fluid or swelling. No visible canal hematoma. Bulky calcified cervical carotid artery atherosclerosis. Disc levels: Advanced lower cervical spine disc and endplate degeneration, bulky at C5-C6 and  C6-C7. Chronic spinal stenosis suspected at both levels. Vacuum disc at the latter. Widespread right greater than left cervical facet arthropathy. Upper chest: Visible upper thoracic levels appear intact. Negative lung apices. IMPRESSION: 1. No acute traumatic injury identified in the cervical spine. 2. Bulky chronic lower cervical spine disc and endplate degeneration with chronic spinal stenosis, C5-C6 and C6-C7. 3. Bulky calcified cervical carotid artery atherosclerosis. Electronically Signed   By: VEAR Hurst M.D.   On: 07/20/2024 11:17   CT Head Wo Contrast Result Date: 07/20/2024 CLINICAL DATA:  73 year old female status post fall on Eliquis . Pain. EXAM: CT HEAD WITHOUT CONTRAST TECHNIQUE: Contiguous axial images were obtained from the base of the skull through the vertex without intravenous contrast. RADIATION DOSE REDUCTION: This exam  was performed according to the departmental dose-optimization program which includes automated exposure control, adjustment of the mA and/or kV according to patient size and/or use of iterative reconstruction technique. COMPARISON:  None Available. FINDINGS: Brain: Chronic encephalomalacia posterior right MCA territory with mild ex vacuo enlargement of the atrium of the right lateral ventricle. No midline shift, ventriculomegaly, mass effect, evidence of mass lesion, intracranial hemorrhage or evidence of cortically based acute infarction. Outside of the right MCA territory gray-white differentiation is normal for age. Vascular: Calcified atherosclerosis at the skull base. No suspicious intracranial vascular hyperdensity. Skull: Intact.  No acute osseous abnormality identified. Sinuses/Orbits: Visualized paranasal sinuses and mastoids are clear. Other: No orbit or scalp soft tissue injury identified. IMPRESSION: 1. No acute intracranial abnormality or acute traumatic injury identified. 2. Chronic infarct with encephalomalacia posterior Right MCA territory. Electronically Signed    By: VEAR Hurst M.D.   On: 07/20/2024 11:14   DG Hip Unilat W or Wo Pelvis 2-3 Views Left Result Date: 07/20/2024 CLINICAL DATA:  Fall with hip pain. EXAM: DG HIP (WITH OR WITHOUT PELVIS) 2-3V LEFT COMPARISON:  None Available. FINDINGS: There is no evidence of hip fracture or dislocation. There is no evidence of arthropathy or other focal bone abnormality. IMPRESSION: Negative. Electronically Signed   By: Camellia Candle M.D.   On: 07/20/2024 09:48    EKG: Independently reviewed.   Assessment/Plan  Closed left hip fracture s/p mechanical fall -admit to med surg  - npo for possible repair  -Dr Tobie from ortho consulting  - place on hip fx protocol -continue supportive pain medication  - currently pain is well control  -patient is cleared for surgery and is a low risk for low-intermediate surgery     Hypertension -stable resume home regime with metoprolol  and lisinopril   Hypothyroidism -resume synthroid   CAD s/p CABG -no active issues -continue metoprolol , statin   CKDIIIb Chronic metabolic acidosis -at baseline  - continue bicarb supplementation   Paroxysmal atrial fibrillation  -hold on Eliquis .   DVT prophylaxis: scd Code Status: full/ as discussed per patient wishes in event of cardiac arrest  Family Communication: none at bedside Disposition Plan: patient  expected to be admitted greater than 2 midnights  Consults called: Dr Tobie [facility] Admission status: med tele   Camila DELENA Ned MD [facility]   If 7PM-7AM, please contact night-coverage [email or web address] Password [facility]  07/20/2024, 4:31 PM

## 2024-07-20 NOTE — Consult Note (Signed)
 ORTHOPAEDIC CONSULTATION  REQUESTING PHYSICIAN: Debby Camila LABOR, MD  Chief Complaint:   L hip pain  History of Present Illness: Desiree Madden is a 73 y.o. female who had a fall earlier today over her husband's walker.  The patient noted pain about the left knee, left elbow, and left hip.  She had a difficult time getting off the floor, but notes that she was able to take a few steps after EMS arrived.  The patient lives at home with her husband.  Her husband has dementia and she is his full-time caregiver.  Her daughter also lives nearby.  Patient ambulates unassisted at baseline.  X-rays in the emergency department were negative.  MRI was obtained and this shows a nondisplaced left intertrochanteric hip fracture.  Patient does have a medical history significant for atrial fibrillation and is on Eliquis  for this.  She took her last dose last night.  She is also had a history of prior CABG, and has stage III chronic kidney disease. She has an ostomy from a prior colectomy after a perforated colon from diverticulitis. This was also complicated by severe sepsis.   Past Medical History:  Diagnosis Date   Hypertension    Thyroid  disease    Past Surgical History:  Procedure Laterality Date   CARDIAC SURGERY     COLECTOMY WITH COLOSTOMY CREATION/HARTMANN PROCEDURE Left 03/01/2021   Procedure: COLECTOMY WITH COLOSTOMY CREATION/HARTMANN PROCEDURE;  Surgeon: Lane Shope, MD;  Location: [facility] ORS;  Service: General;  Laterality: Left;   COLOSTOMY REVISION N/A 03/09/2021   Procedure: COLOSTOMY REVISION;  Surgeon: Lane Shope, MD;  Location: [facility] OR[facility];  Service: General;  Laterality: N/A;   TRACHEOSTOMY TUBE PLACEMENT N/A 03/08/2021   Procedure: TRACHEOSTOMY;  Surgeon: Blair Mt, MD;  Location: [facility] ORS;  Service: ENT;  Laterality: N/A;   Social History   Socioeconomic History   Marital status: Married    Spouse  name: Not on file   Number of children: Not on file   Years of education: Not on file   Highest education level: Not on file  Occupational History   Not on file  Tobacco Use   Smoking status: Former   Smokeless tobacco: Never  Vaping Use   Vaping status: Never Used  Substance and Sexual Activity   Alcohol use: Not Currently   Drug use: Never   Sexual activity: Not on file  Other Topics Concern   Not on file  Social History Narrative   Not on file   Social Drivers of Health   Financial Resource Strain: Low Risk  (04/06/2024)   Received from [facility]   Overall Financial Resource Strain (CARDIA)    Difficulty of Paying Living Expenses: Not hard at all  Food Insecurity: No Food Insecurity (04/06/2024)   Received from [facility]   Hunger Vital Sign    Within the past 12 months, you worried that your food would run out before you got the money to buy more.: Never true    Within the past 12 months, the food you bought just didn't last and you didn't have money to get more.: Never true  Transportation Needs: No Transportation Needs (04/06/2024)   Received from [facility] - Transportation    In the past 12 months, has lack of transportation kept you from medical appointments or from getting medications?: No    Lack of Transportation (Non-Medical): No  Physical Activity: Not on file  Stress: Not on  file  Social Connections: Not on file   Family History  Problem Relation Age of Onset   Arthritis Other    CAD Mother    Allergies  Allergen Reactions   Cymbalta  [Duloxetine  Hcl] Nausea And Vomiting   Baclofen     Other reaction(s): Headache Other reaction(s): Headache, Other (See Comments) Other reaction(s): Headache Other reaction(s): Headache    Sertraline     Other reaction(s): Other (See Comments), Other (See Comments) Muscle aches Muscle aches  Other reaction(s): Other (See Comments), Other (See  Comments) Muscle aches Other reaction(s): Other (See Comments), Other (See Comments) Muscle aches Muscle aches Muscle aches Muscle aches Other reaction(s): Other (See Comments), Other (See Comments) Muscle aches Muscle aches    Sulfa Antibiotics     Other reaction(s): Other (See Comments), Other (See Comments) Pt was told not to take it due to colitis Pt was told not to take it due to colitis    Ibuprofen Nausea And Vomiting    Other reaction(s): Other (See Comments) Due to stage 3 kidney disease    Mirtazapine      Other reaction(s): Other (See Comments), Other (See Comments) irritability irritability    Trazodone  Diarrhea   Trazodone  And Nefazodone    Prior to Admission medications   Medication Sig Start Date End Date Taking? Authorizing Provider  acetaminophen  (TYLENOL ) 325 MG tablet Take 1-2 tablets (325-650 mg total) by mouth every 4 (four) hours as needed for mild pain. 04/13/21  Yes Love, Sharlet RAMAN, PA-C  ALPRAZolam  (XANAX ) 0.25 MG tablet Take 1 tablet (0.25 mg total) by mouth 2 (two) times daily as needed for anxiety or sleep. 04/16/21  Yes Angiulli, Toribio PARAS, PA-C  ascorbic acid  (VITAMIN C ) 500 MG tablet Take 1 tablet (500 mg total) by mouth 2 (two) times daily. 04/16/21  Yes Love, Sharlet RAMAN, PA-C  atorvastatin  (LIPITOR) 20 MG tablet Take 1 tablet (20 mg total) by mouth daily. 04/16/21  Yes Love, Sharlet RAMAN, PA-C  cetirizine (ZYRTEC) 10 MG tablet cetirizine 10 mg tablet  Take 1 tablet every day by oral route.   Yes [provider]  chlorhexidine  gluconate, MEDLINE KIT, (PERIDEX ) 0.12 % solution 15 mLs by Mouth Rinse route 2 (two) times daily. 04/16/21  Yes Love, Sharlet RAMAN, PA-C  ELIQUIS  2.5 MG TABS tablet Take 2.5 mg by mouth 2 (two) times daily.   Yes [provider]  ferrous sulfate 325 (65 FE) MG EC tablet Take 325 mg by mouth.  Take 325 mg by mouth every other day 01/05/24  Yes [provider]  fluticasone  (FLONASE ) 50 MCG/ACT nasal spray Place 2 sprays  into both nostrils daily.   Yes [provider]  levothyroxine  (SYNTHROID ) 75 MCG tablet Take 75 mcg by mouth daily.   Yes [provider]  lisinopril  (ZESTRIL ) 5 MG tablet Take 5 mg by mouth daily.   Yes [provider]  metoprolol  succinate (TOPROL -XL) 25 MG 24 hr tablet Take 25 mg by mouth daily.   Yes [provider]  mirtazapine  (REMERON ) 15 MG tablet Take 1 tablet (15 mg total) by mouth at bedtime. 04/16/21  Yes Love, Sharlet RAMAN, PA-C  oxyCODONE  (OXY IR/ROXICODONE ) 5 MG immediate release tablet Take 1 tablet (5 mg total) by mouth 2 (two) times daily as needed for moderate pain or severe pain. 11/29/21  Yes Lovorn, Megan, MD  pantoprazole  (PROTONIX ) 40 MG tablet Take 1 tablet (40 mg total) by mouth daily. 04/16/21  Yes Love, Sharlet RAMAN, PA-C  sodium bicarbonate  650  MG tablet Take 650 mg by mouth daily. 05/21/23  Yes [provider]  albuterol  (VENTOLIN  HFA) 108 (90 Base) MCG/ACT inhaler albuterol  sulfate HFA 90 mcg/actuation aerosol inhaler  Inhale 2 puffs every 4 hours by inhalation route. Patient not taking: Reported on 07/20/2024    [provider]  azelastine  (ASTELIN ) 0.1 % nasal spray azelastine  137 mcg (0.1 %) nasal spray aerosol    [provider]  calcitRIOL (ROCALTROL) 0.25 MCG capsule Take 0.25 mcg by mouth.  Take 1 capsule (0.25 mcg total) by mouth 1 (one) time each day Patient not taking: Reported on 07/20/2024 08/27/23 08/26/24  [provider]  ciclopirox (PENLAC) 8 % solution Apply 1 Application topically at bedtime. 07/03/23   [provider]  Multiple Vitamin (MULTIVITAMIN WITH MINERALS) TABS tablet Take 1 tablet by mouth daily. Patient not taking: Reported on 07/20/2024 04/04/21   Fausto Sor A, DO  nitroGLYCERIN  (NITROSTAT ) 0.4 MG SL tablet Place under the tongue. 04/20/21 08/01/23  [provider]  Ostomy Supplies (NEW IMAGE DRAINABLE POUCH ) Pouch MISC Use 1 Bag 2 (two) times daily 04/20/21    [provider]  simethicone (MYLICON) 80 MG chewable tablet Chew 80 mg by mouth every 6 (six) hours as needed for flatulence. Patient not taking: Reported on 07/20/2024    [provider]   Recent Labs    07/20/24 1346  WBC 9.7  HGB 10.1*  HCT 32.1*  PLT 176  K 5.3*  CL 108  CO2 21*  BUN 18  CREATININE 1.83*  GLUCOSE 80  CALCIUM  8.9  INR 1.2   MR HIP LEFT WO CONTRAST Result Date: 07/20/2024 CLINICAL DATA:  Fracture, hip EXAM: MR OF THE LEFT HIP WITHOUT CONTRAST TECHNIQUE: Multiplanar, multisequence MR imaging was performed. No intravenous contrast was administered. COMPARISON:  Left hip radiographs dated 07/20/2024. CT abdomen/pelvis dated 08/01/2023. FINDINGS: Bones/Hip: Nondisplaced intertrochanteric fracture of the left proximal femur with associated marrow edema and hypointense fracture line extending through the lateral facet of the left greater trochanter (series 8 and 9, image 22). No dislocation. The left hip joint is anatomically aligned. Mild superolateral joint space narrowing and marginal osteophytosis. The labrum is grossly intact, although evaluation is limited by lack of intra-articular fluid/contrast. No appreciable focal chondral defect. Ligamentum teres and transverse ligaments are intact. No hip joint effusion. Similar relatively symmetric degenerative changes of the right hip also noted. The remainder of the visualized bones demonstrate normal marrow signal intensity. SI joints and pubic symphysis are anatomically aligned. Degenerative changes of the visualized lower lumbar spine. Soft tissue and Muscle: Subcutaneous edema overlying the posterolateral left hip. Edema of the left quadratus femoris muscle surrounding the nondisplaced left proximal femoral intertrochanteric fracture. Gluteal cuff insertions appear intact. Hamstring tendon origins are intact.Visualized intrapelvic contents are grossly unremarkable. IMPRESSION: 1. Nondisplaced intertrochanteric  fracture of the left proximal femur with associated marrow edema and hypointense fracture line extending through the lateral facet of the left greater trochanter. Gluteal cuff insertions appear intact. 2. Mild osteoarthritis of the bilateral hips. Electronically Signed   By: Harrietta Sherry M.D.   On: 07/20/2024 13:07   CT Cervical Spine Wo Contrast Result Date: 07/20/2024 CLINICAL DATA:  73 year old female status post fall on Eliquis . Pain. EXAM: CT CERVICAL SPINE WITHOUT CONTRAST TECHNIQUE: Multidetector CT imaging of the cervical spine was performed without intravenous contrast. Multiplanar CT image reconstructions were also generated. RADIATION DOSE REDUCTION: This exam was performed according to the departmental dose-optimization program which includes automated exposure control, adjustment  of the mA and/or kV according to patient size and/or use of iterative reconstruction technique. COMPARISON:  Head CT today.  Thoracic spine MRI 02/22/2022. FINDINGS: Alignment: Straightening of cervical lordosis similar to the previous MRI. Mild degenerative appearing anterolisthesis of C4 on C5. Cervicothoracic junction alignment is within normal limits. Bilateral posterior element alignment is within normal limits. Skull base and vertebrae: Visualized skull base is intact. No atlanto-occipital dissociation. C1 and C2 appear intact and aligned. No acute osseous abnormality identified. Soft tissues and spinal canal: No prevertebral fluid or swelling. No visible canal hematoma. Bulky calcified cervical carotid artery atherosclerosis. Disc levels: Advanced lower cervical spine disc and endplate degeneration, bulky at C5-C6 and C6-C7. Chronic spinal stenosis suspected at both levels. Vacuum disc at the latter. Widespread right greater than left cervical facet arthropathy. Upper chest: Visible upper thoracic levels appear intact. Negative lung apices. IMPRESSION: 1. No acute traumatic injury identified in the cervical spine.  2. Bulky chronic lower cervical spine disc and endplate degeneration with chronic spinal stenosis, C5-C6 and C6-C7. 3. Bulky calcified cervical carotid artery atherosclerosis. Electronically Signed   By: VEAR Hurst M.D.   On: 07/20/2024 11:17   CT Head Wo Contrast Result Date: 07/20/2024 CLINICAL DATA:  73 year old female status post fall on Eliquis . Pain. EXAM: CT HEAD WITHOUT CONTRAST TECHNIQUE: Contiguous axial images were obtained from the base of the skull through the vertex without intravenous contrast. RADIATION DOSE REDUCTION: This exam was performed according to the departmental dose-optimization program which includes automated exposure control, adjustment of the mA and/or kV according to patient size and/or use of iterative reconstruction technique. COMPARISON:  None Available. FINDINGS: Brain: Chronic encephalomalacia posterior right MCA territory with mild ex vacuo enlargement of the atrium of the right lateral ventricle. No midline shift, ventriculomegaly, mass effect, evidence of mass lesion, intracranial hemorrhage or evidence of cortically based acute infarction. Outside of the right MCA territory gray-white differentiation is normal for age. Vascular: Calcified atherosclerosis at the skull base. No suspicious intracranial vascular hyperdensity. Skull: Intact.  No acute osseous abnormality identified. Sinuses/Orbits: Visualized paranasal sinuses and mastoids are clear. Other: No orbit or scalp soft tissue injury identified. IMPRESSION: 1. No acute intracranial abnormality or acute traumatic injury identified. 2. Chronic infarct with encephalomalacia posterior Right MCA territory. Electronically Signed   By: VEAR Hurst M.D.   On: 07/20/2024 11:14   DG Hip Unilat W or Wo Pelvis 2-3 Views Left Result Date: 07/20/2024 CLINICAL DATA:  Fall with hip pain. EXAM: DG HIP (WITH OR WITHOUT PELVIS) 2-3V LEFT COMPARISON:  None Available. FINDINGS: There is no evidence of hip fracture or dislocation. There is no  evidence of arthropathy or other focal bone abnormality. IMPRESSION: Negative. Electronically Signed   By: Camellia Candle M.D.   On: 07/20/2024 09:48     Positive ROS: All other systems have been reviewed and were otherwise negative with the exception of those mentioned in the HPI and as above.  Physical Exam: BP 109/67 (BP Location: Left Arm)   Pulse 62   Temp 98 F (36.7 C)   Resp 17   Ht 5' 4 (1.626 m)   Wt 52.2 kg   SpO2 100%   BMI 19.74 kg/m  General:  Alert, no acute distress Psychiatric:  Patient is competent for consent with normal mood and affect    Orthopedic Exam:  LLE: + DF/PF/EHL SILT grossly over foot Foot wwp Inconsistent pain with logroll and axial load Able to range hip to 90 degrees forward flexion, 30  ER, 20 IR   Imaging:  As above: L nondisplaced intertrochanteric hip fracture  Assessment/Plan: Desiree Madden is a 73 y.o. female with a nondisplaced L intertrochanteric hip fracture  1. I discussed the various treatment options including both surgical and non-surgical management of the fracture with the patient and her daughter. We discussed the risk of perioperative complications due to patient's age and other co-morbidities. After discussion of risks, benefits, and alternatives to surgery, the family and/or patient were in agreement to proceed with surgery. Plan for surgery is L hip cephalomedullary nailing tomorrow, 07/21/2024 2. NPO after midnight 3. Hold anticoagulation in advance of OR   Earnestine Blanch   07/20/2024 6:14 PM

## 2024-07-20 NOTE — ED Triage Notes (Signed)
 Pt arrived via EMS for a mechanical fall after tripping over her walker. Pt sts that she did not hit her head or have any LOC. Pt complains of lt hip pain. Pt was able to put weight on the left lower ext for EMS with assistance to the stretcher.

## 2024-07-20 NOTE — ED Provider Notes (Signed)
 [facility] Provider Note    Event Date/Time   First MD Initiated Contact with Patient 07/20/24 [phone]     (approximate)   History   Fall   HPI  Desiree Madden is a 73 y.o. female history of hypertension chronic kidney disease ostomy bag, chronic A-fib on Eliquis   Patient reports normal health.  She was preparing food for her husband, his walker was sitting out when she accidentally walked into it stumbled forward.  She reports she fell onto her left side but has pain around her left upper thigh and hip area.  She also scraped her left knee.  She does not know if she hit her head she does not think so but she reports it all happened very fast.  No headache.  Reports some mild achiness in the left lower neck.  No numbness or weakness.  She was otherwise in her normal health up preparing breakfast and reports she just stumbled over the walker which her husband had left out  No loss of consciousness.  No chest pain no abdominal pain.  No injury to her right arm right leg.  Ports a little bit of a bruise around her left elbow but no pain through range of motion     Physical Exam   Triage Vital Signs: ED Triage Vitals  Encounter Vitals Group     BP 07/20/24 0854 (!) 155/83     Girls Systolic BP Percentile --      Girls Diastolic BP Percentile --      Boys Systolic BP Percentile --      Boys Diastolic BP Percentile --      Pulse Rate 07/20/24 0854 69     Resp 07/20/24 0854 17     Temp 07/20/24 0854 98.3 F (36.8 C)     Temp Source 07/20/24 0854 Oral     SpO2 07/20/24 0854 100 %     Weight 07/20/24 0855 115 lb (52.2 kg)     Height 07/20/24 0855 5' 4 (1.626 m)     Head Circumference --      Peak Flow --      Pain Score 07/20/24 0855 8     Pain Loc --      Pain Education --      Exclude from Growth Chart --     Most recent vital signs: Vitals:   07/20/24 0854 07/20/24 1030  BP: (!) 155/83 127/67  Pulse: 69 64  Resp: 17 18  Temp: 98.3 F  (36.8 C) 98.1 F (36.7 C)  SpO2: 100% 100%     General: Awake, no distress.  Normocephalic atraumatic.  Noticed midline cervical tenderness but does report mild tenderness over the left lower paraspinous region of the neck without deformity CV:  Good peripheral perfusion.  Normal tones and rate Resp:  Normal effort.  Clear bilateral Abd:  No distention.  Soft nontender nondistended no bruising across the abdomen or flanks Other:  Mild tenderness to palpation over the left trochanter but she holds the hip with normal length no rotation.  Demonstrate normal use of the toes able to bend flex the hip with only mild discomfort in the hip area.  She also has skin tear about 3 x 3 cm overlying the left tibial plateau but reports no pain or discomfort throughout the knee joint there is no deformity.  Right lower extremity atraumatic normal flexion extension of the ankle knee hip.   ED Results / Procedures / Treatments  Labs (all labs ordered are listed, but only abnormal results are displayed) Labs Reviewed  CBC - Abnormal; Notable for the following components:      Result Value   RBC 3.57 (*)    Hemoglobin 10.1 (*)    HCT 32.1 (*)    All other components within normal limits  BASIC METABOLIC PANEL WITH GFR  PROTIME-INR     EKG     RADIOLOGY  Left hip x-ray interpreted by me is negative for acute fracture.  I have ordered MRI of the left hip to assure no occult fracture.  Additionally CT of the head and neck performed    MR HIP LEFT WO CONTRAST Result Date: 07/20/2024 CLINICAL DATA:  Fracture, hip EXAM: MR OF THE LEFT HIP WITHOUT CONTRAST TECHNIQUE: Multiplanar, multisequence MR imaging was performed. No intravenous contrast was administered. COMPARISON:  Left hip radiographs dated 07/20/2024. CT abdomen/pelvis dated 08/01/2023. FINDINGS: Bones/Hip: Nondisplaced intertrochanteric fracture of the left proximal femur with associated marrow edema and hypointense fracture line  extending through the lateral facet of the left greater trochanter (series 8 and 9, image 22). No dislocation. The left hip joint is anatomically aligned. Mild superolateral joint space narrowing and marginal osteophytosis. The labrum is grossly intact, although evaluation is limited by lack of intra-articular fluid/contrast. No appreciable focal chondral defect. Ligamentum teres and transverse ligaments are intact. No hip joint effusion. Similar relatively symmetric degenerative changes of the right hip also noted. The remainder of the visualized bones demonstrate normal marrow signal intensity. SI joints and pubic symphysis are anatomically aligned. Degenerative changes of the visualized lower lumbar spine. Soft tissue and Muscle: Subcutaneous edema overlying the posterolateral left hip. Edema of the left quadratus femoris muscle surrounding the nondisplaced left proximal femoral intertrochanteric fracture. Gluteal cuff insertions appear intact. Hamstring tendon origins are intact.Visualized intrapelvic contents are grossly unremarkable. IMPRESSION: 1. Nondisplaced intertrochanteric fracture of the left proximal femur with associated marrow edema and hypointense fracture line extending through the lateral facet of the left greater trochanter. Gluteal cuff insertions appear intact. 2. Mild osteoarthritis of the bilateral hips. Electronically Signed   By: Harrietta Sherry M.D.   On: 07/20/2024 13:07   CT Cervical Spine Wo Contrast Result Date: 07/20/2024 CLINICAL DATA:  73 year old female status post fall on Eliquis . Pain. EXAM: CT CERVICAL SPINE WITHOUT CONTRAST TECHNIQUE: Multidetector CT imaging of the cervical spine was performed without intravenous contrast. Multiplanar CT image reconstructions were also generated. RADIATION DOSE REDUCTION: This exam was performed according to the departmental dose-optimization program which includes automated exposure control, adjustment of the mA and/or kV according to  patient size and/or use of iterative reconstruction technique. COMPARISON:  Head CT today.  Thoracic spine MRI 02/22/2022. FINDINGS: Alignment: Straightening of cervical lordosis similar to the previous MRI. Mild degenerative appearing anterolisthesis of C4 on C5. Cervicothoracic junction alignment is within normal limits. Bilateral posterior element alignment is within normal limits. Skull base and vertebrae: Visualized skull base is intact. No atlanto-occipital dissociation. C1 and C2 appear intact and aligned. No acute osseous abnormality identified. Soft tissues and spinal canal: No prevertebral fluid or swelling. No visible canal hematoma. Bulky calcified cervical carotid artery atherosclerosis. Disc levels: Advanced lower cervical spine disc and endplate degeneration, bulky at C5-C6 and C6-C7. Chronic spinal stenosis suspected at both levels. Vacuum disc at the latter. Widespread right greater than left cervical facet arthropathy. Upper chest: Visible upper thoracic levels appear intact. Negative lung apices. IMPRESSION: 1. No acute traumatic injury identified in the cervical spine. 2. Bulky  chronic lower cervical spine disc and endplate degeneration with chronic spinal stenosis, C5-C6 and C6-C7. 3. Bulky calcified cervical carotid artery atherosclerosis. Electronically Signed   By: VEAR Hurst M.D.   On: 07/20/2024 11:17   CT Head Wo Contrast Result Date: 07/20/2024 CLINICAL DATA:  73 year old female status post fall on Eliquis . Pain. EXAM: CT HEAD WITHOUT CONTRAST TECHNIQUE: Contiguous axial images were obtained from the base of the skull through the vertex without intravenous contrast. RADIATION DOSE REDUCTION: This exam was performed according to the departmental dose-optimization program which includes automated exposure control, adjustment of the mA and/or kV according to patient size and/or use of iterative reconstruction technique. COMPARISON:  None Available. FINDINGS: Brain: Chronic encephalomalacia  posterior right MCA territory with mild ex vacuo enlargement of the atrium of the right lateral ventricle. No midline shift, ventriculomegaly, mass effect, evidence of mass lesion, intracranial hemorrhage or evidence of cortically based acute infarction. Outside of the right MCA territory gray-white differentiation is normal for age. Vascular: Calcified atherosclerosis at the skull base. No suspicious intracranial vascular hyperdensity. Skull: Intact.  No acute osseous abnormality identified. Sinuses/Orbits: Visualized paranasal sinuses and mastoids are clear. Other: No orbit or scalp soft tissue injury identified. IMPRESSION: 1. No acute intracranial abnormality or acute traumatic injury identified. 2. Chronic infarct with encephalomalacia posterior Right MCA territory. Electronically Signed   By: VEAR Hurst M.D.   On: 07/20/2024 11:14   DG Hip Unilat W or Wo Pelvis 2-3 Views Left Result Date: 07/20/2024 CLINICAL DATA:  Fall with hip pain. EXAM: DG HIP (WITH OR WITHOUT PELVIS) 2-3V LEFT COMPARISON:  None Available. FINDINGS: There is no evidence of hip fracture or dislocation. There is no evidence of arthropathy or other focal bone abnormality. IMPRESSION: Negative. Electronically Signed   By: Camellia Candle M.D.   On: 07/20/2024 09:48      PROCEDURES:  Critical Care performed: No  Procedures   MEDICATIONS ORDERED IN ED: Medications  oxyCODONE -acetaminophen  (PERCOCET/ROXICET) 5-325 MG per tablet 2 tablet (2 tablets Oral Given 07/20/24 0946)     IMPRESSION / MDM / ASSESSMENT AND PLAN / ED COURSE  I reviewed the triage vital signs and the nursing notes.                              Differential diagnosis includes, but is not limited to, blunt injuries, exclude left hip fracture though pretest probability is somewhat low, skin tear to the left knee.  Will obtain CT head and neck given the patient's use of Eliquis  and also mild left neck pain but probability pretest for acute intracranial  hemorrhage or cervical fracture is quite low.  Normal state of health reports she stumbled over the walker.  No indication noted for cardiac testing labs etc.  Patient's presentation is most consistent with acute complicated illness / injury requiring diagnostic workup.      Clinical Course as of 07/20/24 1412  Tue Jul 20, 2024  1055 Resting comfortably.  Awaiting CT.  Some delay due to multiple patients awaiting imaging this morning [MQ]  1321 Patient resting comfortably, unfortunately MRI revealing of a nondisplaced left hip fracture.  Have paged and sent haiku message requesting consult orthopedics Dr Earnestine Blanch.  [MQ]  1324 Dr. Blanch currently in the OR, will provide consult once out.  Aware of consult [MQ]  1348 Patient agreeable with plan for admission, understands pending orthopedic consult.  She has been a few sips of water  while  in the ER but now NPO.  I did discuss with Dr. Anthony OR team that she is on Eliquis .  Accepted to hospitalist service by Dr. Debby, CBC and metabolic panel are pending now as preoperative evaluation [MQ]    Clinical Course User Index [MQ] Dicky Anes, MD   CBC hemoglobin 10.0.  Further labs to be followed by the hospitalist  FINAL CLINICAL IMPRESSION(S) / ED DIAGNOSES   Final diagnoses:  Closed left hip fracture, initial encounter (HCC)  Skin tear of left lower leg without complication, initial encounter     Rx / DC Orders   ED Discharge Orders     None        Note:  This document was prepared using Dragon voice recognition software and may include unintentional dictation errors.   Dicky Anes, MD 07/20/24 [phone]

## 2024-07-20 NOTE — Plan of Care (Signed)
  Problem: Education: Goal: Knowledge of General Education information will improve Description: Including pain rating scale, medication(s)/side effects and non-pharmacologic comfort measures 07/20/2024 1928 by Drusilla Rounds, RN Outcome: Progressing 07/20/2024 1928 by Drusilla Rounds, RN Outcome: Progressing 07/20/2024 1928 by Drusilla Rounds, RN Outcome: Progressing

## 2024-07-21 ENCOUNTER — Encounter
Admission: EM | Disposition: A | Discharge status: Home Health | Payer: Self-pay | Source: Home / Self Care | Attending: Obstetrics and Gynecology

## 2024-07-21 ENCOUNTER — Inpatient Hospital Stay

## 2024-07-21 ENCOUNTER — Other Ambulatory Visit: Payer: Self-pay

## 2024-07-21 ENCOUNTER — Inpatient Hospital Stay: Payer: Self-pay

## 2024-07-21 ENCOUNTER — Encounter: Payer: Self-pay | Admitting: Internal Medicine

## 2024-07-21 DIAGNOSIS — S72002A Fracture of unspecified part of neck of left femur, initial encounter for closed fracture: Secondary | ICD-10-CM | POA: Diagnosis not present

## 2024-07-21 DIAGNOSIS — I2510 Atherosclerotic heart disease of native coronary artery without angina pectoris: Secondary | ICD-10-CM | POA: Insufficient documentation

## 2024-07-21 DIAGNOSIS — E440 Moderate protein-calorie malnutrition: Secondary | ICD-10-CM | POA: Insufficient documentation

## 2024-07-21 HISTORY — PX: INTRAMEDULLARY (IM) NAIL INTERTROCHANTERIC: SHX5875

## 2024-07-21 LAB — COMPREHENSIVE METABOLIC PANEL WITH GFR
ALT: 12 U/L (ref 0–44)
AST: 27 U/L (ref 15–41)
Albumin: 3.1 g/dL — ABNORMAL LOW (ref 3.5–5.0)
Alkaline Phosphatase: 63 U/L (ref 38–126)
Anion gap: 7 (ref 5–15)
BUN: 22 mg/dL (ref 8–23)
CO2: 21 mmol/L — ABNORMAL LOW (ref 22–32)
Calcium: 8.4 mg/dL — ABNORMAL LOW (ref 8.9–10.3)
Chloride: 111 mmol/L (ref 98–111)
Creatinine, Ser: 1.63 mg/dL — ABNORMAL HIGH (ref 0.44–1.00)
GFR, Estimated: 33 mL/min — ABNORMAL LOW (ref >60)
Glucose, Bld: 101 mg/dL — ABNORMAL HIGH (ref 70–99)
Potassium: 4.8 mmol/L (ref 3.5–5.1)
Sodium: 139 mmol/L (ref 135–145)
Total Bilirubin: 1 mg/dL (ref 0.0–1.2)
Total Protein: 6.9 g/dL (ref 6.5–8.1)

## 2024-07-21 LAB — CBC
HCT: 29.1 % — ABNORMAL LOW (ref 36.0–46.0)
Hemoglobin: 9.2 g/dL — ABNORMAL LOW (ref 12.0–15.0)
MCH: 28.8 pg (ref 26.0–34.0)
MCHC: 31.6 g/dL (ref 30.0–36.0)
MCV: 90.9 fL (ref 80.0–100.0)
Platelets: 153 K/uL (ref 150–400)
RBC: 3.2 MIL/uL — ABNORMAL LOW (ref 3.87–5.11)
RDW: 12.9 % (ref 11.5–15.5)
WBC: 5.3 K/uL (ref 4.0–10.5)
nRBC: 0 % (ref 0.0–0.2)

## 2024-07-21 LAB — SURGICAL PCR SCREEN
MRSA, PCR: NEGATIVE
Staphylococcus aureus: NEGATIVE

## 2024-07-21 SURGERY — FIXATION, FRACTURE, INTERTROCHANTERIC, WITH INTRAMEDULLARY ROD
Anesthesia: General | Site: Hip | Laterality: Left

## 2024-07-21 MED ORDER — METHOCARBAMOL 500 MG PO TABS
500.0000 mg | ORAL_TABLET | Freq: Four times a day (QID) | ORAL | Status: DC | PRN
Start: 2024-07-21 — End: 2024-07-23

## 2024-07-21 MED ORDER — OXYCODONE HCL 5 MG PO TABS
2.5000 mg | ORAL_TABLET | ORAL | Status: DC | PRN
  Administered 2024-07-22: 5 mg via ORAL
  Filled 2024-07-21: qty 1

## 2024-07-21 MED ORDER — FENTANYL CITRATE (PF) 100 MCG/2ML IJ SOLN
INTRAMUSCULAR | Status: AC
Start: 2024-07-21 — End: 2024-07-21
  Filled 2024-07-21: qty 2

## 2024-07-21 MED ORDER — CEFAZOLIN SODIUM-DEXTROSE 2-4 GM/100ML-% IV SOLN
INTRAVENOUS | Status: AC
  Filled 2024-07-21: qty 100

## 2024-07-21 MED ORDER — FENTANYL CITRATE (PF) 100 MCG/2ML IJ SOLN
25.0000 ug | INTRAMUSCULAR | Status: AC | PRN
  Administered 2024-07-21 (×16): 25 ug via INTRAVENOUS

## 2024-07-21 MED ORDER — 0.9 % SODIUM CHLORIDE (POUR BTL) OPTIME
TOPICAL | Status: DC | PRN
  Administered 2024-07-21 (×2): 500 mL

## 2024-07-21 MED ORDER — ENSURE PLUS HIGH PROTEIN PO LIQD
237.0000 mL | Freq: Two times a day (BID) | ORAL | Status: DC
  Filled 2024-07-21 (×2): qty 237

## 2024-07-21 MED ORDER — DEXMEDETOMIDINE HCL IN NACL 80 MCG/20ML IV SOLN
INTRAVENOUS | Status: DC | PRN
  Administered 2024-07-21 (×2): 4 ug via INTRAVENOUS

## 2024-07-21 MED ORDER — ACETAMINOPHEN 10 MG/ML IV SOLN
INTRAVENOUS | Status: AC
  Filled 2024-07-21: qty 100

## 2024-07-21 MED ORDER — OXYCODONE HCL 5 MG PO TABS
5.0000 mg | ORAL_TABLET | ORAL | Status: DC | PRN
  Administered 2024-07-21 – 2024-07-23 (×7): 10 mg via ORAL
  Filled 2024-07-21 (×6): qty 2

## 2024-07-21 MED ORDER — TRANEXAMIC ACID-NACL 1000-0.7 MG/100ML-% IV SOLN
INTRAVENOUS | Status: AC
  Filled 2024-07-21: qty 100

## 2024-07-21 MED ORDER — ONDANSETRON HCL 4 MG/2ML IJ SOLN
INTRAMUSCULAR | Status: DC | PRN
  Administered 2024-07-21 (×2): 4 mg via INTRAVENOUS

## 2024-07-21 MED ORDER — BUPIVACAINE HCL (PF) 0.5 % IJ SOLN
INTRAMUSCULAR | Status: AC
Start: 2024-07-21 — End: 2024-07-21
  Filled 2024-07-21: qty 30

## 2024-07-21 MED ORDER — CEFAZOLIN SODIUM-DEXTROSE 2-4 GM/100ML-% IV SOLN
2.0000 g | Freq: Three times a day (TID) | INTRAVENOUS | Status: AC
  Administered 2024-07-21 – 2024-07-22 (×4): 2 g via INTRAVENOUS
  Filled 2024-07-21 (×3): qty 100

## 2024-07-21 MED ORDER — METOCLOPRAMIDE HCL 5 MG PO TABS: 5.0000 mg | ORAL_TABLET | Freq: Three times a day (TID) | ORAL | Status: DC | PRN

## 2024-07-21 MED ORDER — HYDROMORPHONE HCL 1 MG/ML IJ SOLN: 0.2000 mg | INTRAMUSCULAR | Status: DC | PRN

## 2024-07-21 MED ORDER — KETOROLAC TROMETHAMINE 15 MG/ML IJ SOLN
7.5000 mg | Freq: Four times a day (QID) | INTRAMUSCULAR | Status: DC
  Administered 2024-07-21 – 2024-07-22 (×3): 7.5 mg via INTRAVENOUS
  Filled 2024-07-21 (×3): qty 1

## 2024-07-21 MED ORDER — PHENYLEPHRINE 80 MCG/ML (10ML) SYRINGE FOR IV PUSH (FOR BLOOD PRESSURE SUPPORT)
PREFILLED_SYRINGE | INTRAVENOUS | Status: AC
  Filled 2024-07-21: qty 10

## 2024-07-21 MED ORDER — SUGAMMADEX SODIUM 200 MG/2ML IV SOLN
INTRAVENOUS | Status: DC | PRN
Start: 2024-07-21 — End: 2024-07-21
  Administered 2024-07-21 (×2): 100 mg via INTRAVENOUS

## 2024-07-21 MED ORDER — ENOXAPARIN SODIUM 30 MG/0.3ML IJ SOSY
30.0000 mg | PREFILLED_SYRINGE | INTRAMUSCULAR | Status: DC
  Administered 2024-07-22 – 2024-07-23 (×2): 30 mg via SUBCUTANEOUS
  Filled 2024-07-21 (×2): qty 0.3

## 2024-07-21 MED ORDER — OXYCODONE HCL 5 MG/5ML PO SOLN: 5.0000 mg | Freq: Once | ORAL | Status: AC | PRN

## 2024-07-21 MED ORDER — ONDANSETRON HCL 4 MG/2ML IJ SOLN
INTRAMUSCULAR | Status: AC
  Filled 2024-07-21: qty 2

## 2024-07-21 MED ORDER — LACTATED RINGERS IV SOLN: INTRAVENOUS | Status: DC | PRN

## 2024-07-21 MED ORDER — LIDOCAINE HCL (CARDIAC) PF 100 MG/5ML IV SOSY
PREFILLED_SYRINGE | INTRAVENOUS | Status: DC | PRN
Start: 2024-07-21 — End: 2024-07-21
  Administered 2024-07-21 (×2): 60 mg via INTRAVENOUS

## 2024-07-21 MED ORDER — KETAMINE HCL 10 MG/ML IJ SOLN
INTRAMUSCULAR | Status: DC | PRN
  Administered 2024-07-21 (×2): 10 mg via INTRAVENOUS

## 2024-07-21 MED ORDER — PROPOFOL 10 MG/ML IV BOLUS
INTRAVENOUS | Status: AC
  Filled 2024-07-21: qty 20

## 2024-07-21 MED ORDER — FENTANYL CITRATE (PF) 100 MCG/2ML IJ SOLN
INTRAMUSCULAR | Status: DC | PRN
  Administered 2024-07-21 (×2): 50 ug via INTRAVENOUS

## 2024-07-21 MED ORDER — METOCLOPRAMIDE HCL 5 MG/ML IJ SOLN: 5.0000 mg | Freq: Three times a day (TID) | INTRAMUSCULAR | Status: DC | PRN

## 2024-07-21 MED ORDER — BUPIVACAINE LIPOSOME 1.3 % IJ SUSP
INTRAMUSCULAR | Status: AC
  Filled 2024-07-21: qty 20

## 2024-07-21 MED ORDER — SODIUM CHLORIDE 0.9 % IV SOLN: INTRAVENOUS | Status: DC

## 2024-07-21 MED ORDER — FENTANYL CITRATE (PF) 100 MCG/2ML IJ SOLN
INTRAMUSCULAR | Status: AC
  Filled 2024-07-21: qty 2

## 2024-07-21 MED ORDER — CHLORHEXIDINE GLUCONATE 0.12 % MT SOLN
OROMUCOSAL | Status: AC
  Filled 2024-07-21: qty 15

## 2024-07-21 MED ORDER — ROCURONIUM BROMIDE 10 MG/ML (PF) SYRINGE
PREFILLED_SYRINGE | INTRAVENOUS | Status: AC
  Filled 2024-07-21: qty 10

## 2024-07-21 MED ORDER — OXYCODONE HCL 5 MG PO TABS
5.0000 mg | ORAL_TABLET | Freq: Once | ORAL | Status: AC | PRN
  Administered 2024-07-21 (×2): 5 mg via ORAL

## 2024-07-21 MED ORDER — ROCURONIUM BROMIDE 100 MG/10ML IV SOLN
INTRAVENOUS | Status: DC | PRN
Start: 2024-07-21 — End: 2024-07-21
  Administered 2024-07-21 (×2): 35 mg via INTRAVENOUS

## 2024-07-21 MED ORDER — METHOCARBAMOL 1000 MG/10ML IJ SOLN: 500.0000 mg | Freq: Four times a day (QID) | INTRAMUSCULAR | Status: DC | PRN

## 2024-07-21 MED ORDER — SENNOSIDES-DOCUSATE SODIUM 8.6-50 MG PO TABS: 1.0000 | ORAL_TABLET | Freq: Every evening | ORAL | Status: DC | PRN

## 2024-07-21 MED ORDER — DEXAMETHASONE SODIUM PHOSPHATE 10 MG/ML IJ SOLN
INTRAMUSCULAR | Status: DC | PRN
Start: 2024-07-21 — End: 2024-07-21
  Administered 2024-07-21 (×2): 5 mg via INTRAVENOUS

## 2024-07-21 MED ORDER — ALBUMIN HUMAN 5 % IV SOLN
INTRAVENOUS | Status: AC
  Filled 2024-07-21: qty 250

## 2024-07-21 MED ORDER — DOCUSATE SODIUM 100 MG PO CAPS
100.0000 mg | ORAL_CAPSULE | Freq: Two times a day (BID) | ORAL | Status: DC
  Administered 2024-07-21 – 2024-07-22 (×3): 100 mg via ORAL
  Filled 2024-07-21 (×2): qty 1

## 2024-07-21 MED ORDER — FLEET ENEMA RE ENEM: 1.0000 | ENEMA | Freq: Once | RECTAL | Status: DC | PRN

## 2024-07-21 MED ORDER — DROPERIDOL 2.5 MG/ML IJ SOLN: 0.6250 mg | Freq: Once | INTRAMUSCULAR | Status: DC | PRN

## 2024-07-21 MED ORDER — ACETAMINOPHEN 10 MG/ML IV SOLN
INTRAVENOUS | Status: DC | PRN
Start: 2024-07-21 — End: 2024-07-21
  Administered 2024-07-21 (×2): 1000 mg via INTRAVENOUS

## 2024-07-21 MED ORDER — ONDANSETRON HCL 4 MG/2ML IJ SOLN: 4.0000 mg | Freq: Four times a day (QID) | INTRAMUSCULAR | Status: DC | PRN

## 2024-07-21 MED ORDER — BUPIVACAINE LIPOSOME 1.3 % IJ SUSP
INTRAMUSCULAR | Status: DC | PRN
  Administered 2024-07-21 (×2): 50 mL via SURGICAL_CAVITY

## 2024-07-21 MED ORDER — ONDANSETRON HCL 4 MG PO TABS: 4.0000 mg | ORAL_TABLET | Freq: Four times a day (QID) | ORAL | Status: DC | PRN

## 2024-07-21 MED ORDER — POLYETHYLENE GLYCOL 3350 17 G PO PACK: 17.0000 g | PACK | Freq: Every day | ORAL | Status: DC

## 2024-07-21 MED ORDER — KETAMINE HCL 50 MG/5ML IJ SOSY
PREFILLED_SYRINGE | INTRAMUSCULAR | Status: AC
  Filled 2024-07-21: qty 5

## 2024-07-21 MED ORDER — OXYCODONE HCL 5 MG PO TABS
ORAL_TABLET | ORAL | Status: AC
Start: 2024-07-21 — End: 2024-07-21
  Filled 2024-07-21: qty 1

## 2024-07-21 MED ORDER — CEFAZOLIN SODIUM-DEXTROSE 2-3 GM-%(50ML) IV SOLR
INTRAVENOUS | Status: DC | PRN
  Administered 2024-07-21 (×2): 2 g via INTRAVENOUS

## 2024-07-21 MED ORDER — ADULT MULTIVITAMIN W/MINERALS CH: 1.0000 | ORAL_TABLET | Freq: Every day | ORAL | Status: DC

## 2024-07-21 MED ORDER — TRAMADOL HCL 50 MG PO TABS: 50.0000 mg | ORAL_TABLET | Freq: Four times a day (QID) | ORAL | Status: DC | PRN

## 2024-07-21 MED ORDER — PROPOFOL 10 MG/ML IV BOLUS
INTRAVENOUS | Status: DC | PRN
  Administered 2024-07-21 (×2): 80 mg via INTRAVENOUS
  Administered 2024-07-21 (×2): 80 ug/kg/min via INTRAVENOUS

## 2024-07-21 MED ORDER — CHLORHEXIDINE GLUCONATE 0.12 % MT SOLN
15.0000 mL | Freq: Once | OROMUCOSAL | Status: AC
  Administered 2024-07-21 (×2): 15 mL via OROMUCOSAL

## 2024-07-21 MED ORDER — PHENYLEPHRINE HCL-NACL 20-0.9 MG/250ML-% IV SOLN
INTRAVENOUS | Status: AC
  Filled 2024-07-21: qty 250

## 2024-07-21 MED ORDER — PHENYLEPHRINE HCL-NACL 20-0.9 MG/250ML-% IV SOLN
INTRAVENOUS | Status: DC | PRN
  Administered 2024-07-21 (×2): 20 ug/min via INTRAVENOUS

## 2024-07-21 MED ORDER — DEXAMETHASONE SODIUM PHOSPHATE 10 MG/ML IJ SOLN
INTRAMUSCULAR | Status: AC
  Filled 2024-07-21: qty 1

## 2024-07-21 MED ORDER — ACETAMINOPHEN 10 MG/ML IV SOLN
1000.0000 mg | Freq: Once | INTRAVENOUS | Status: DC | PRN
Start: 2024-07-21 — End: 2024-07-21

## 2024-07-21 MED ORDER — PROPOFOL 1000 MG/100ML IV EMUL
INTRAVENOUS | Status: AC
  Filled 2024-07-21: qty 100

## 2024-07-21 MED ORDER — BISACODYL 10 MG RE SUPP: 10.0000 mg | Freq: Every day | RECTAL | Status: DC | PRN

## 2024-07-21 MED ORDER — ACETAMINOPHEN 500 MG PO TABS
1000.0000 mg | ORAL_TABLET | Freq: Three times a day (TID) | ORAL | Status: DC
  Administered 2024-07-21 – 2024-07-22 (×3): 1000 mg via ORAL
  Filled 2024-07-21 (×3): qty 2

## 2024-07-21 SURGICAL SUPPLY — 32 items
BIT DRILL INTERTAN LAG SCREW (BIT) IMPLANT
BIT DRILL LONG 4.0 (BIT) IMPLANT
CHLORAPREP W/TINT 26 (MISCELLANEOUS) ×1 IMPLANT
DRAPE SHEET LG 3/4 BI-LAMINATE (DRAPES) ×1 IMPLANT
DRAPE U-SHAPE 47X51 STRL (DRAPES) ×2 IMPLANT
DRSG OPSITE POSTOP 3X4 (GAUZE/BANDAGES/DRESSINGS) ×1 IMPLANT
DRSG OPSITE POSTOP 4X6 (GAUZE/BANDAGES/DRESSINGS) ×1 IMPLANT
DRSG XEROFORM 1X8 (GAUZE/BANDAGES/DRESSINGS) IMPLANT
ELECTRODE REM PT RTRN 9FT ADLT (ELECTROSURGICAL) ×1 IMPLANT
GLOVE BIOGEL PI IND STRL 8 (GLOVE) ×1 IMPLANT
GLOVE SURG SYN 7.5 PF PI (GLOVE) ×1 IMPLANT
GOWN SRG LRG LVL 4 IMPRV REINF (GOWNS) ×1 IMPLANT
GOWN SRG XL LONG LVL 3 NONREIN (GOWNS) ×1 IMPLANT
KIT PATIENT CARE HANA TABLE (KITS) ×1 IMPLANT
KIT TURNOVER CYSTO (KITS) ×1 IMPLANT
MANIFOLD NEPTUNE II (INSTRUMENTS) ×1 IMPLANT
MAT ABSORB FLUID 56X50 GRAY (MISCELLANEOUS) ×2 IMPLANT
NAIL TRIGEN INTERTAN 10X18CM (Nail) IMPLANT
NDL HYPO 22X1.5 SAFETY MO (MISCELLANEOUS) ×1 IMPLANT
NEEDLE HYPO 22X1.5 SAFETY MO (MISCELLANEOUS) ×1 IMPLANT
NS IRRIG 500ML POUR BTL (IV SOLUTION) ×1 IMPLANT
PACK HIP COMPR (MISCELLANEOUS) ×1 IMPLANT
PENCIL SMOKE EVACUATOR (MISCELLANEOUS) ×1 IMPLANT
PIN GUIDE 3.2X343MM (PIN) IMPLANT
SCREW LAG COMPR KIT 90/85 (Screw) IMPLANT
SCREW TRIGEN LOW PROF 5.0X30 (Screw) IMPLANT
STAPLER SKIN PROX 35W (STAPLE) ×1 IMPLANT
SUT VIC AB 2-0 CT2 27 (SUTURE) ×1 IMPLANT
SYR 30ML LL (SYRINGE) ×1 IMPLANT
TAPE CLOTH 3X10 WHT NS LF (GAUZE/BANDAGES/DRESSINGS) ×2 IMPLANT
TRAP FLUID SMOKE EVACUATOR (MISCELLANEOUS) ×1 IMPLANT
WATER STERILE IRR 1000ML POUR (IV SOLUTION) ×1 IMPLANT

## 2024-07-21 NOTE — Progress Notes (Signed)
 PROGRESS NOTE    Desiree Madden  FMW:[id number] DOB: 1951/11/26 DOA: 07/20/2024 PCP: [facility]     Brief Narrative:   From admission h and p  Lind Ausley is a 73 y.o. female with medical history significant of Hypertension, hypothyroidism, CAD s/p CABG, CKDIIIb, Chronic metabolic acidosis , Paroxysmal atrial fibrillation on Eliquis .  Patient presents to ED after tripping over husbands walker this am while attempting to prepare breakfast.  Patient note no head strike no LOC  with fall. She notes currently her left hip is painful with movement but at rest  patient notes no significant pain. ON ros she notes no n/v/d/ chest pain / sob marvina magdalena or dysuria.   Assessment & Plan:   Principal Problem:   Closed left hip fracture (HCC) Active Problems:   Essential hypertension   Hypothyroidism   CKD stage 3b, GFR 30-44 ml/min (HCC)   PAF (paroxysmal atrial fibrillation) (HCC)   Chronic combined systolic and diastolic congestive heart failure (HCC)   S/P CABG x 4   CAD (coronary artery disease)  # Left intertrochanteric hip fracture - operative repair scheduled for today - pain control, PT/OT tomorrow, monitor bleeding, bowel regimen  # CAD S/p cabg in 2015. Recent echo with preserved EF. Denies DOE, chest pain; functional status is at baseline. Do not think further cardiac w/u needed prior to surgery. - home statin  # A-fib, paroxysmal - home apixaban  on hold - cont home metop  # CKD 3b Kidney function stable - monitor - home bicarb  # Skin tear Left knee - x-ray  # Colostomy status - wound care  # GAD - home xanax  prn, mirtazapine   # HTN Bp controlled - holding home lisinopril  - cont home metop  # Hypothyroid - home synthroid    DVT prophylaxis: SCDs Code Status: full Family Communication: none at bedside  Level of care: Med-Surg Status is: Inpatient Remains inpatient appropriate because: severity of  illness    Consultants:  ortho  Procedures: pending  Antimicrobials:  Peri-op    Subjective: Stable left hip pain  Objective: Vitals:   07/20/24 1859 07/20/24 1942 07/21/24 0310 07/21/24 0836  BP: 118/76 (!) 122/59 128/76 113/63  Pulse: 70 67 68 65  Resp: 17 18 18 18   Temp:  98.1 F (36.7 C) 98.6 F (37 C) 99.2 F (37.3 C)  TempSrc:  Oral Oral Oral  SpO2: 100% 100% 100% (!) 89%  Weight:      Height:        Intake/Output Summary (Last 24 hours) at 07/21/2024 1122 Last data filed at 07/21/2024 0154 Gross per 24 hour  Intake 637.96 ml  Output 500 ml  Net 137.96 ml   Filed Weights   07/20/24 0855  Weight: 52.2 kg    Examination:  General exam: Appears calm and comfortable  Respiratory system: Clear to auscultation. Respiratory effort normal. Cardiovascular system: S1 & S2 heard, RRR.   Gastrointestinal system: Abdomen is nondistended, soft and nontender. Colostmy bag in place Central nervous system: Alert and oriented. No focal neurological deficits. Distal sensation intact Extremities: palpable DPs LLE Skin: skin tear left knee Psychiatry: Judgement and insight appear normal. Mood & affect appropriate.     Data Reviewed: I have personally reviewed following labs and imaging studies  CBC: Recent Labs  Lab 07/20/24 1346 07/21/24 0222  WBC 9.7 5.3  HGB 10.1* 9.2*  HCT 32.1* 29.1*  MCV 89.9 90.9  PLT 176 153   Basic Metabolic Panel: Recent Labs  Lab 07/20/24 1346 07/21/24 0222  NA 138 139  K 5.3* 4.8  CL 108 111  CO2 21* 21*  GLUCOSE 80 101*  BUN 18 22  CREATININE 1.83* 1.63*  CALCIUM  8.9 8.4*   GFR: Estimated Creatinine Clearance: 25.3 mL/min (A) (by C-G formula based on SCr of 1.63 mg/dL (H)). Liver Function Tests: Recent Labs  Lab 07/21/24 0222  AST 27  ALT 12  ALKPHOS 63  BILITOT 1.0  PROT 6.9  ALBUMIN  3.1*   No results for input(s): LIPASE, AMYLASE in the last 168 hours. No results for input(s): AMMONIA in the last  168 hours. Coagulation Profile: Recent Labs  Lab 07/20/24 1346  INR 1.2   Cardiac Enzymes: No results for input(s): CKTOTAL, CKMB, CKMBINDEX, TROPONINI in the last 168 hours. BNP (last 3 results) No results for input(s): PROBNP in the last 8760 hours. HbA1C: No results for input(s): HGBA1C in the last 72 hours. CBG: No results for input(s): GLUCAP in the last 168 hours. Lipid Profile: No results for input(s): CHOL, HDL, LDLCALC, TRIG, CHOLHDL, LDLDIRECT in the last 72 hours. Thyroid  Function Tests: No results for input(s): TSH, T4TOTAL, FREET4, T3FREE, THYROIDAB in the last 72 hours. Anemia Panel: No results for input(s): VITAMINB12, FOLATE, FERRITIN, TIBC, IRON, RETICCTPCT in the last 72 hours. Urine analysis:    Component Value Date/Time   COLORURINE YELLOW (A) 08/01/2023 1033   APPEARANCEUR CLOUDY (A) 08/01/2023 1033   LABSPEC 1.006 08/01/2023 1033   PHURINE 6.0 08/01/2023 1033   GLUCOSEU NEGATIVE 08/01/2023 1033   HGBUR LARGE (A) 08/01/2023 1033   BILIRUBINUR NEGATIVE 08/01/2023 1033   KETONESUR NEGATIVE 08/01/2023 1033   PROTEINUR NEGATIVE 08/01/2023 1033   NITRITE NEGATIVE 08/01/2023 1033   LEUKOCYTESUR LARGE (A) 08/01/2023 1033   Sepsis Labs: @LABRCNTIP (procalcitonin:4,lacticidven:4)  ) Recent Results (from the past 240 hours)  Surgical pcr screen     Status: None   Collection Time: 07/21/24  1:45 AM   Specimen: Nasal Mucosa; Nasal Swab  Result Value Ref Range Status   MRSA, PCR NEGATIVE NEGATIVE Final   Staphylococcus aureus NEGATIVE NEGATIVE Final    Comment: (NOTE) The Xpert SA Assay (FDA approved for NASAL specimens in patients 68 years of age and older), is one component of a comprehensive surveillance program. It is not intended to diagnose infection nor to guide or monitor treatment. Performed at [facility] [address], [place], [place] [address]          Radiology  Studies: Chest Portable 1 View Result Date: 07/20/2024 CLINICAL DATA:  Hip fracture. EXAM: PORTABLE CHEST 1 VIEW COMPARISON:  August 02, 2023 FINDINGS: Multiple sternal wires and vascular clips are seen. The heart size and mediastinal contours are within normal limits. Mild atelectatic changes are noted within the left lung base. No pleural effusion or pneumothorax is identified. Evidence of prior vertebroplasty is seen within the midthoracic spine. No acute osseous abnormalities are identified. IMPRESSION: 1. Evidence of prior median sternotomy/CABG. 2. Mild left basilar atelectasis. Electronically Signed   By: Suzen Dials M.D.   On: 07/20/2024 20:50   MR HIP LEFT WO CONTRAST Result Date: 07/20/2024 CLINICAL DATA:  Fracture, hip EXAM: MR OF THE LEFT HIP WITHOUT CONTRAST TECHNIQUE: Multiplanar, multisequence MR imaging was performed. No intravenous contrast was administered. COMPARISON:  Left hip radiographs dated 07/20/2024. CT abdomen/pelvis dated 08/01/2023. FINDINGS: Bones/Hip: Nondisplaced intertrochanteric fracture of the left proximal femur with associated marrow edema and hypointense fracture line extending through the lateral facet of the left greater trochanter (  series 8 and 9, image 22). No dislocation. The left hip joint is anatomically aligned. Mild superolateral joint space narrowing and marginal osteophytosis. The labrum is grossly intact, although evaluation is limited by lack of intra-articular fluid/contrast. No appreciable focal chondral defect. Ligamentum teres and transverse ligaments are intact. No hip joint effusion. Similar relatively symmetric degenerative changes of the right hip also noted. The remainder of the visualized bones demonstrate normal marrow signal intensity. SI joints and pubic symphysis are anatomically aligned. Degenerative changes of the visualized lower lumbar spine. Soft tissue and Muscle: Subcutaneous edema overlying the posterolateral left hip. Edema of the  left quadratus femoris muscle surrounding the nondisplaced left proximal femoral intertrochanteric fracture. Gluteal cuff insertions appear intact. Hamstring tendon origins are intact.Visualized intrapelvic contents are grossly unremarkable. IMPRESSION: 1. Nondisplaced intertrochanteric fracture of the left proximal femur with associated marrow edema and hypointense fracture line extending through the lateral facet of the left greater trochanter. Gluteal cuff insertions appear intact. 2. Mild osteoarthritis of the bilateral hips. Electronically Signed   By: Harrietta Sherry M.D.   On: 07/20/2024 13:07   CT Cervical Spine Wo Contrast Result Date: 07/20/2024 CLINICAL DATA:  73 year old female status post fall on Eliquis . Pain. EXAM: CT CERVICAL SPINE WITHOUT CONTRAST TECHNIQUE: Multidetector CT imaging of the cervical spine was performed without intravenous contrast. Multiplanar CT image reconstructions were also generated. RADIATION DOSE REDUCTION: This exam was performed according to the departmental dose-optimization program which includes automated exposure control, adjustment of the mA and/or kV according to patient size and/or use of iterative reconstruction technique. COMPARISON:  Head CT today.  Thoracic spine MRI 02/22/2022. FINDINGS: Alignment: Straightening of cervical lordosis similar to the previous MRI. Mild degenerative appearing anterolisthesis of C4 on C5. Cervicothoracic junction alignment is within normal limits. Bilateral posterior element alignment is within normal limits. Skull base and vertebrae: Visualized skull base is intact. No atlanto-occipital dissociation. C1 and C2 appear intact and aligned. No acute osseous abnormality identified. Soft tissues and spinal canal: No prevertebral fluid or swelling. No visible canal hematoma. Bulky calcified cervical carotid artery atherosclerosis. Disc levels: Advanced lower cervical spine disc and endplate degeneration, bulky at C5-C6 and C6-C7.  Chronic spinal stenosis suspected at both levels. Vacuum disc at the latter. Widespread right greater than left cervical facet arthropathy. Upper chest: Visible upper thoracic levels appear intact. Negative lung apices. IMPRESSION: 1. No acute traumatic injury identified in the cervical spine. 2. Bulky chronic lower cervical spine disc and endplate degeneration with chronic spinal stenosis, C5-C6 and C6-C7. 3. Bulky calcified cervical carotid artery atherosclerosis. Electronically Signed   By: VEAR Hurst M.D.   On: 07/20/2024 11:17   CT Head Wo Contrast Result Date: 07/20/2024 CLINICAL DATA:  73 year old female status post fall on Eliquis . Pain. EXAM: CT HEAD WITHOUT CONTRAST TECHNIQUE: Contiguous axial images were obtained from the base of the skull through the vertex without intravenous contrast. RADIATION DOSE REDUCTION: This exam was performed according to the departmental dose-optimization program which includes automated exposure control, adjustment of the mA and/or kV according to patient size and/or use of iterative reconstruction technique. COMPARISON:  None Available. FINDINGS: Brain: Chronic encephalomalacia posterior right MCA territory with mild ex vacuo enlargement of the atrium of the right lateral ventricle. No midline shift, ventriculomegaly, mass effect, evidence of mass lesion, intracranial hemorrhage or evidence of cortically based acute infarction. Outside of the right MCA territory gray-white differentiation is normal for age. Vascular: Calcified atherosclerosis at the skull base. No suspicious intracranial vascular hyperdensity. Skull: Intact.  No acute osseous abnormality identified. Sinuses/Orbits: Visualized paranasal sinuses and mastoids are clear. Other: No orbit or scalp soft tissue injury identified. IMPRESSION: 1. No acute intracranial abnormality or acute traumatic injury identified. 2. Chronic infarct with encephalomalacia posterior Right MCA territory. Electronically Signed   By: VEAR Hurst M.D.   On: 07/20/2024 11:14   DG Hip Unilat W or Wo Pelvis 2-3 Views Left Result Date: 07/20/2024 CLINICAL DATA:  Fall with hip pain. EXAM: DG HIP (WITH OR WITHOUT PELVIS) 2-3V LEFT COMPARISON:  None Available. FINDINGS: There is no evidence of hip fracture or dislocation. There is no evidence of arthropathy or other focal bone abnormality. IMPRESSION: Negative. Electronically Signed   By: Camellia Candle M.D.   On: 07/20/2024 09:48        Scheduled Meds:  ascorbic acid   500 mg Oral BID   atorvastatin   20 mg Oral QHS   levothyroxine   75 mcg Oral Q0600   lisinopril   5 mg Oral Daily   metoprolol  succinate  25 mg Oral Daily   mirtazapine   15 mg Oral QHS   pantoprazole   40 mg Oral Daily   sodium bicarbonate   650 mg Oral Daily   Continuous Infusions:  sodium chloride  75 mL/hr at 07/21/24 0154    ceFAZolin  (ANCEF ) IV       LOS: 1 day     Devaughn KATHEE Ban, MD [facility]   If 7PM-7AM, please contact night-coverage [email or web address] Password [facility] 07/21/2024, 11:22 AM

## 2024-07-21 NOTE — H&P (Signed)
 H&P reviewed. No significant changes noted.

## 2024-07-21 NOTE — Evaluation (Signed)
 Physical Therapy Evaluation Patient Details Name: Desiree Madden MRN: [id number] DOB: [birth year] Today's Date: 07/21/2024  History of Present Illness  73 y/o female s/p fall and L hip fracture.  ORIF with IM nailing 8/13.  Clinical Impression  Pt awake after surgery and very pleasant and motivated to work with PT.  Ultimately she did quite well with first session showing good L LE muscle engagement with exercises, CGA only with bed mobility and transfers and was able to ambulate ~100 ft with slow but steadily improving speed and cadence effort using the walker. Pt will benefit from continued PT per hip ORIF protocols.        If plan is discharge home, recommend the following: Assistance with cooking/housework;Assist for transportation;Help with stairs or ramp for entrance;A little help with bathing/dressing/bathroom   Can travel by private vehicle        Equipment Recommendations None recommended by PT  Recommendations for Other Services       Functional Status Assessment Patient has had a recent decline in their functional status and demonstrates the ability to make significant improvements in function in a reasonable and predictable amount of time.     Precautions / Restrictions Precautions Precautions: Fall Restrictions Weight Bearing Restrictions Per Provider Order: Yes RLE Weight Bearing Per Provider Order: Weight bearing as tolerated      Mobility  Bed Mobility Overal bed mobility: Needs Assistance Bed Mobility: Supine to Sit     Supine to sit: Contact guard     General bed mobility comments: plenty of cuing and extra time, did not need direct PT assist    Transfers Overall transfer level: Needs assistance Equipment used: Rolling walker (2 wheels) Transfers: Sit to/from Stand Sit to Stand: Contact guard assist           General transfer comment: cuing for set up, sequencing and AD use, no phyiscal assist from standard height bed     Ambulation/Gait Ambulation/Gait assistance: Supervision Gait Distance (Feet): 100 Feet Assistive device: Rolling walker (2 wheels)         General Gait Details: Pt initially with some L WBing hesitation and guarded effort, but with cuing and increased time up she showed increased confidence, speed and consistency of cadence.  Stairs            Wheelchair Mobility     Tilt Bed    Modified Rankin (Stroke Patients Only)       Balance Overall balance assessment: Modified Independent                                           Pertinent Vitals/Pain Pain Assessment Pain Assessment: 0-10 Pain Score: 6  Pain Location: L hip    Home Living Family/patient expects to be discharged to:: Private residence Living Arrangements: Spouse/significant other Available Help at Discharge: Available PRN/intermittently (pt is caregiver for her husband, daughter & S-in-L live locally and can help PRN) Type of Home: Mobile home Home Access: Stairs to enter Entrance Stairs-Rails: Can reach both Entrance Stairs-Number of Steps: 3   Home Layout: One level Home Equipment: Rolling Walker (2 wheels);Grab bars - toilet;Shower seat;Grab bars - tub/shower      Prior Function Prior Level of Function : Independent/Modified Independent             Mobility Comments: Pt typically driving, active, cares for husband, etc w/o AD ADLs Comments: independent  Extremity/Trunk Assessment   Upper Extremity Assessment Upper Extremity Assessment: Overall WFL for tasks assessed    Lower Extremity Assessment Lower Extremity Assessment: Overall WFL for tasks assessed (expected post-op weakness, no L SLR)       Communication   Communication Communication: No apparent difficulties    Cognition Arousal: Alert Behavior During Therapy: WFL for tasks assessed/performed   PT - Cognitive impairments: No apparent impairments                         Following  commands: Intact       Cueing Cueing Techniques: Verbal cues, Visual cues     General Comments General comments (skin integrity, edema, etc.): Pt did very well with POD0 ORIF expectations    Exercises General Exercises - Lower Extremity Ankle Circles/Pumps: AROM, 10 reps Quad Sets: Strengthening, 5 reps Gluteal Sets: AROM, 5 reps Heel Slides: AAROM, AROM, 5 reps (with resisted leg ext) Hip ABduction/ADduction: AAROM, 5 reps   Assessment/Plan    PT Assessment Patient needs continued PT services  PT Problem List Decreased strength;Decreased range of motion;Decreased activity tolerance;Decreased balance;Decreased mobility;Decreased safety awareness;Decreased knowledge of precautions;Pain       PT Treatment Interventions DME instruction;Gait training;Stair training;Functional mobility training;Therapeutic activities;Therapeutic exercise;Balance training;Patient/family education    PT Goals (Current goals can be found in the Care Plan section)  Acute Rehab PT Goals Patient Stated Goal: go home to care for husband PT Goal Formulation: With patient Time For Goal Achievement: 08/03/24 Potential to Achieve Goals: Good    Frequency BID     Co-evaluation               AM-PAC PT 6 Clicks Mobility  Outcome Measure Help needed turning from your back to your side while in a flat bed without using bedrails?: None Help needed moving from lying on your back to sitting on the side of a flat bed without using bedrails?: None Help needed moving to and from a bed to a chair (including a wheelchair)?: A Little Help needed standing up from a chair using your arms (e.g., wheelchair or bedside chair)?: A Little Help needed to walk in hospital room?: A Little Help needed climbing 3-5 steps with a railing? : A Little 6 Click Score: 20    End of Session Equipment Utilized During Treatment: Gait belt Activity Tolerance: Patient tolerated treatment well Patient left: with chair alarm  set;with call bell/phone within reach;with family/visitor present Nurse Communication: Mobility status PT Visit Diagnosis: Muscle weakness (generalized) (M62.81);Difficulty in walking, not elsewhere classified (R26.2);Pain Pain - Right/Left: Left Pain - part of body: Hip    Time: 8375-8345 PT Time Calculation (min) (ACUTE ONLY): 30 min   Charges:   PT Evaluation $PT Eval Low Complexity: 1 Low PT Treatments $Gait Training: 8-22 mins $Therapeutic Exercise: 8-22 mins PT General Charges $$ ACUTE PT VISIT: 1 Visit         Carmin JONELLE Deed, DPT 07/21/2024, 5:23 PM

## 2024-07-21 NOTE — Progress Notes (Addendum)
 Initial Nutrition Assessment  DOCUMENTATION CODES:   Non-severe (moderate) malnutrition in context of chronic illness  INTERVENTION:   -Once diet is advanced:   -MVI with minerals daily -Ensure Plus High Protein po BID, each supplement provides 350 kcal and 20 grams of protein   NUTRITION DIAGNOSIS:   Moderate Malnutrition related to chronic illness as evidenced by mild fat depletion, moderate fat depletion, mild muscle depletion, moderate muscle depletion.  GOAL:   Patient will meet greater than or equal to 90% of their needs  MONITOR:   PO intake, Supplement acceptance, Diet advancement  REASON FOR ASSESSMENT:   Consult Assessment of nutrition requirement/status, Hip fracture protocol  ASSESSMENT:   Pt with medical history significant of Hypertension, hypothyroidism, CAD s/p CABG, CKDIIIb, Chronic metabolic acidosis , Paroxysmal atrial fibrillation on Eliquis .  Patient presents after tripping over husband's walker on 07/20/24 AM while attempting to prepare breakfast.  Pt admitted with closed hip fracture s/p mechanical fall.   Reviewed I/O's: +138 ml x 24 hours  UOP: 500 ml x 24 hours  Per orthopedics notes, plan for cephamedullary nailing today. Pt is NPO for procedure.   Spoke with pt at bedside, who was pleasant and in good spirits today. Pt reports being under stress due to being the full time caregiver for her husband with dementia, but has support form her daughter who lives next door. Pt recalls being hospitalized approximately 3 years ago with diverticulitis; hospitalization and rehab stay caused her to lose a lot of weight. Pt shares that she has lost most of her teeth due to multiple medical issues and has an upcoming appointment with a dentist for denture fitting. Pt shares she consumes 3 meals per day, mainly soft foods (chopped meats and chicken salad, mashed potatoes, canned fruits and vegetables, and protein shakes).   Pt endorses a significant wt loss  over the past 2-3 years, but unsure how much she has lost. Reviewed wt hx; pt has experienced a 28% wt loss over the past year, which is significant for time frame.   Discussed importance of good meal and supplement intake to promote healing. Pt amenable to supplements.   Medicaytions reviewed and incluide vitamin C , remeron , protonix , and sodium bicarbonate .   Labs reviewed.   NUTRITION - FOCUSED PHYSICAL EXAM:  Flowsheet Row Most Recent Value  Orbital Region Mild depletion  Upper Arm Region Moderate depletion  Thoracic and Lumbar Region Mild depletion  Buccal Region Mild depletion  Temple Region Mild depletion  Clavicle Bone Region Moderate depletion  Clavicle and Acromion Bone Region Moderate depletion  Scapular Bone Region Moderate depletion  Dorsal Hand Moderate depletion  Patellar Region Moderate depletion  Anterior Thigh Region Moderate depletion  Posterior Calf Region Moderate depletion  Edema (RD Assessment) None  Hair Reviewed  Eyes Reviewed  Mouth Reviewed  Skin Reviewed  Nails Reviewed    Diet Order:   Diet Order             Diet NPO time specified  Diet effective midnight                   EDUCATION NEEDS:   Education needs have been addressed  Skin:  Skin Assessment: Skin Integrity Issues: Skin Integrity Issues:: Incisions Incisions: closed lt hip  Last BM:  07/19/24 (via colostomy)  Height:   Ht Readings from Last 1 Encounters:  07/20/24 5' 4 (1.626 m)    Weight:   Wt Readings from Last 1 Encounters:  07/20/24 52.2 kg  Ideal Body Weight:  54.5 kg  BMI:  Body mass index is 19.74 kg/m.  Estimated Nutritional Needs:   Kcal:  1600-1800  Protein:  80-95 grams  Fluid:  1.6-1.8 L    Margery ORN, RD, LDN, CDCES Registered Dietitian III Certified Diabetes Care and Education Specialist If unable to reach this RD, please use RD Inpatient group chat on secure chat between hours of 8am-4 pm daily

## 2024-07-21 NOTE — Transfer of Care (Signed)
 Immediate Anesthesia Transfer of Care Note  Patient: Desiree Madden  Procedure(s) Performed: FIXATION, FRACTURE, INTERTROCHANTERIC, WITH INTRAMEDULLARY ROD (Left: Hip)  Patient Location: PACU  Anesthesia Type:General  Level of Consciousness: awake, alert , and oriented  Airway & Oxygen Therapy: Patient Spontanous Breathing and Patient connected to face mask oxygen  Post-op Assessment: Report given to RN and Post -op Vital signs reviewed and stable  Post vital signs: stable  Last Vitals:  Vitals Value Taken Time  BP 101/78 07/21/24 14:08  Temp    Pulse 123 07/21/24 14:09  Resp 10 07/21/24 14:14  SpO2 84 % 07/21/24 14:09  Vitals shown include unfiled device data.  Last Pain:  Vitals:   07/21/24 1200  TempSrc:   PainSc: 0-No pain         Complications: No notable events documented.

## 2024-07-21 NOTE — Consult Note (Signed)
 WOCN consult regarding estb colostomy Patient has been seen since 5/22 by the [facility] team, we will assess patient for any needs tomorrow, placed on follow-up, placed ostomy care orders for staff.   Ostomy pouching: 2pc. 2 1/4 pouch ([place]# [id number]). Skin barrier Soila # [id number] [place] # [phone])   Sherrilyn Hals MSN RN [facility] WOC [facility]  [phone] (Available from 7-3 pm Mon-Friday)

## 2024-07-21 NOTE — Op Note (Signed)
 DATE OF SURGERY: 07/21/2024  PREOPERATIVE DIAGNOSIS: Left intertrochanteric hip fracture  POSTOPERATIVE DIAGNOSIS: Left intertrochanteric hip fracture  PROCEDURE: Intramedullary nailing of Left femur with cephalomedullary device  SURGEON: Earnestine HILARIO Blanch, MD  ANESTHESIA: Gen  EBL: 50 cc  IVF: per anesthesia record  COMPONENTS:  [employer] Trigen Intertan Short Nail: 10x165mm; 90mm lag screw with 85mm compression screw; 5x 30mm distal cortical interlocking screw  INDICATIONS: Desiree Madden is a 73 y.o. female who sustained a non-displaced intertrochanteric fracture after a fall. Risks and benefits of intramedullary nailing were explained to the patient and/or family . Risks include but are not limited to bleeding, infection, injury to tissues, nerves, vessels, nonunion/malunion, hardware failure, limb length discrepancy/hip rotation mismatch and risks of anesthesia. The patient and/or family understand these risks, have completed an informed consent, and wish to proceed.   PROCEDURE:  The patient was brought into the operating room. After administering anesthesia, the patient was placed in the supine position on the Hana table. The uninjured leg was placed in an extended position while the injured lower extremity was placed in a neutral position. The fracture was nondisplaced and did not require reduction. The lateral aspect of the hip and thigh were prepped with ChloraPrep solution before being draped sterilely. Preoperative IV antibiotics were administered. A timeout was performed to verify the appropriate surgical site, patient, and procedure.    The greater trochanter was identified and an approximately 4cm incision was made about 3 fingerbreadths above the tip of the greater trochanter. The incision was carried down through the subcutaneous tissues to expose the gluteal fascia. This was split the length of the incision, providing access to the tip of the trochanter. Under  fluoroscopic guidance, a guidewire was drilled through the tip of the trochanter into the proximal metaphysis to the level of the lesser trochanter. After verifying its position fluoroscopically in AP and lateral projections, it was overreamed with the opening reamer to the level of the lesser trochanter. The nail was selected and advanced to the appropriate depth as verified fluoroscopically.    The guide system for the lag screw was positioned and advanced through an approximately 4cm incision over the lateral aspect of the proximal femur. The guidewire was drilled up through the femoral nail and into the femoral neck to rest within 5 mm of subchondral bone. After verifying its position in the femoral neck and head in both AP and lateral projections, the guidewire was measured and appropriate sized lag screw was selected.  The channel for the compression screw was drilled and antirotation bar was placed.  Lag screw was drilled and placed in appropriate position.  Compression screw was then placed.  Appropriate compression was achieved.  The set screw was locked in place. Again, the adequacy of hardware position and fracture reduction was verified fluoroscopically in AP and lateral projections.   Attention was then turned to the distal interlocking screw in the diaphysis. Using a targeted assembly, a stab incision was made and hole was drilled through the nail. An interlocking screw was placed with excellent purchase.  Appropriate screw position was verified fluoroscopically in AP and lateral projections.   The wounds were irrigated thoroughly with sterile saline solution. Local anesthetic was injected into the wounds. Deep fascia was closed with 0-Vicryl. The subcutaneous tissues were closed using 2-0 Vicryl interrupted sutures. The skin was closed using staples. Sterile occlusive dressings were applied to all wounds. The patient was then transferred to the recovery room in satisfactory condition.  POSTOPERATIVE PLAN: The patient will be WBAT on the operative extremity. Lovenox  40mg /day x 4 weeks to start on POD#1. Perioperative IV antibiotics x 24 hours. PT/OT on POD#1.

## 2024-07-21 NOTE — Anesthesia Procedure Notes (Signed)
 Procedure Name: Intubation Date/Time: 07/21/2024 12:46 PM  Performed by: Gillermo Spruce I, CRNAPre-anesthesia Checklist: Patient identified, Emergency Drugs available, Suction available, Patient being monitored and Timeout performed Patient Re-evaluated:Patient Re-evaluated prior to induction Oxygen Delivery Method: Circle system utilized Preoxygenation: Pre-oxygenation with 100% oxygen Induction Type: IV induction Ventilation: Mask ventilation without difficulty Laryngoscope Size: McGrath and 3 Grade View: Grade I Tube type: Oral Tube size: 6.0 mm Number of attempts: 1 Airway Equipment and Method: Stylet and Oral airway Placement Confirmation: ETT inserted through vocal cords under direct vision, positive ETCO2 and breath sounds checked- equal and bilateral Secured at: 20 cm Tube secured with: Tape Dental Injury: Teeth and Oropharynx as per pre-operative assessment

## 2024-07-22 ENCOUNTER — Encounter: Payer: Self-pay | Admitting: Orthopedic Surgery

## 2024-07-22 ENCOUNTER — Inpatient Hospital Stay

## 2024-07-22 DIAGNOSIS — S72002A Fracture of unspecified part of neck of left femur, initial encounter for closed fracture: Secondary | ICD-10-CM | POA: Diagnosis not present

## 2024-07-22 LAB — PREPARE RBC (CROSSMATCH)

## 2024-07-22 LAB — BASIC METABOLIC PANEL WITH GFR
Anion gap: 8 (ref 5–15)
Anion gap: 9 (ref 5–15)
BUN: 28 mg/dL — ABNORMAL HIGH (ref 8–23)
BUN: 31 mg/dL — ABNORMAL HIGH (ref 8–23)
CO2: 20 mmol/L — ABNORMAL LOW (ref 22–32)
CO2: 19 mmol/L — ABNORMAL LOW (ref 22–32)
Calcium: 8.5 mg/dL — ABNORMAL LOW (ref 8.9–10.3)
Calcium: 8.6 mg/dL — ABNORMAL LOW (ref 8.9–10.3)
Chloride: 108 mmol/L (ref 98–111)
Chloride: 103 mmol/L (ref 98–111)
Creatinine, Ser: 1.96 mg/dL — ABNORMAL HIGH (ref 0.44–1.00)
Creatinine, Ser: 2.09 mg/dL — ABNORMAL HIGH (ref 0.44–1.00)
GFR, Estimated: 27 mL/min — ABNORMAL LOW (ref >60)
GFR, Estimated: 25 mL/min — ABNORMAL LOW (ref >60)
Glucose, Bld: 188 mg/dL — ABNORMAL HIGH (ref 70–99)
Glucose, Bld: 96 mg/dL (ref 70–99)
Potassium: 5.7 mmol/L — ABNORMAL HIGH (ref 3.5–5.1)
Potassium: 4.1 mmol/L (ref 3.5–5.1)
Sodium: 136 mmol/L (ref 135–145)
Sodium: 131 mmol/L — ABNORMAL LOW (ref 135–145)

## 2024-07-22 LAB — CBC
HCT: 22.7 % — ABNORMAL LOW (ref 36.0–46.0)
HCT: 31.1 % — ABNORMAL LOW (ref 36.0–46.0)
Hemoglobin: 7.4 g/dL — ABNORMAL LOW (ref 12.0–15.0)
Hemoglobin: 10.2 g/dL — ABNORMAL LOW (ref 12.0–15.0)
MCH: 29.5 pg (ref 26.0–34.0)
MCH: 29.3 pg (ref 26.0–34.0)
MCHC: 32.6 g/dL (ref 30.0–36.0)
MCHC: 32.8 g/dL (ref 30.0–36.0)
MCV: 90.4 fL (ref 80.0–100.0)
MCV: 89.4 fL (ref 80.0–100.0)
Platelets: 142 K/uL — ABNORMAL LOW (ref 150–400)
Platelets: 152 K/uL (ref 150–400)
RBC: 2.51 MIL/uL — ABNORMAL LOW (ref 3.87–5.11)
RBC: 3.48 MIL/uL — ABNORMAL LOW (ref 3.87–5.11)
RDW: 13.2 % (ref 11.5–15.5)
RDW: 13.8 % (ref 11.5–15.5)
WBC: 9.8 K/uL (ref 4.0–10.5)
WBC: 10 K/uL (ref 4.0–10.5)
nRBC: 0 % (ref 0.0–0.2)
nRBC: 0 % (ref 0.0–0.2)

## 2024-07-22 MED ORDER — SODIUM ZIRCONIUM CYCLOSILICATE 10 G PO PACK
10.0000 g | PACK | Freq: Once | ORAL | Status: AC
  Administered 2024-07-22: 10 g via ORAL
  Filled 2024-07-22: qty 1

## 2024-07-22 MED ORDER — TRAMADOL HCL 50 MG PO TABS: 50.0000 mg | ORAL_TABLET | Freq: Four times a day (QID) | ORAL | 0 refills | Status: AC | PRN

## 2024-07-22 MED ORDER — OXYCODONE HCL 5 MG PO TABS: 2.5000 mg | ORAL_TABLET | ORAL | 0 refills | Status: AC | PRN

## 2024-07-22 MED ORDER — SODIUM CHLORIDE 0.9% IV SOLUTION: Freq: Once | INTRAVENOUS | Status: AC

## 2024-07-22 NOTE — Discharge Instructions (Addendum)
 INSTRUCTIONS AFTER Surgery  Remove items at home which could result in a fall. This includes throw rugs or furniture in walking pathways ICE to the affected joint every three hours while awake for 30 minutes at a time, for at least the first 3-5 days, and then as needed for pain and swelling.  Continue to use ice for pain and swelling. You may notice swelling that will progress down to the foot and ankle.  This is normal after surgery.  Elevate your leg when you are not up walking on it.   Continue to use the breathing machine you got in the hospital (incentive spirometer) which will help keep your temperature down.  It is common for your temperature to cycle up and down following surgery, especially at night when you are not up moving around and exerting yourself.  The breathing machine keeps your lungs expanded and your temperature down.   DIET:  As you were doing prior to hospitalization, we recommend a well-balanced diet.  DRESSING / WOUND CARE / SHOWERING  Dressing changes needed.  Dressing is waterproof.  Light water  when showering.  Follow-up with [facility] clinic orthopedics in 2 weeks for staple removal and x-rays of the left hip  ACTIVITY  Increase activity slowly as tolerated, but follow the weight bearing instructions below.   No driving for 6 weeks or until further direction given by your physician.  You cannot drive while taking narcotics.  No lifting or carrying greater than 10 lbs. until further directed by your surgeon. Avoid periods of inactivity such as sitting longer than an hour when not asleep. This helps prevent blood clots.  You may return to work once you are authorized by your doctor.     WEIGHT BEARING  Weightbearing as tolerated on the left   EXERCISES Gait training and ambulation training at home with home health physical therapy  CONSTIPATION  Constipation is defined medically as fewer than three stools per week and severe constipation as less than one  stool per week.  Even if you have a regular bowel pattern at home, your normal regimen is likely to be disrupted due to multiple reasons following surgery.  Combination of anesthesia, postoperative narcotics, change in appetite and fluid intake all can affect your bowels.   YOU MUST use at least one of the following options; they are listed in order of increasing strength to get the job done.  They are all available over the counter, and you may need to use some, POSSIBLY even all of these options:    Drink plenty of fluids (prune juice may be helpful) and high fiber foods Colace 100 mg by mouth twice a day  Senokot for constipation as directed and as needed Dulcolax (bisacodyl ), take with full glass of water   Miralax  (polyethylene glycol) once or twice a day as needed.  If you have tried all these things and are unable to have a bowel movement in the first 3-4 days after surgery call either your surgeon or your primary doctor.    If you experience loose stools or diarrhea, hold the medications until you stool forms back up.  If your symptoms do not get better within 1 week or if they get worse, check with your doctor.  If you experience the worst abdominal pain ever or develop nausea or vomiting, please contact the office immediately for further recommendations for treatment.   ITCHING:  If you experience itching with your medications, try taking only a single pain pill, or even  half a pain pill at a time.  You can also use Benadryl  over the counter for itching or also to help with sleep.   TED HOSE STOCKINGS:  Use stockings on both legs until for at least 2 weeks or as directed by physician office. They may be removed at night for sleeping.  MEDICATIONS:  See your medication summary on the "After Visit Summary" that nursing will review with you.  You may have some home medications which will be placed on hold until you complete the course of blood thinner medication.  It is important for you to  complete the blood thinner medication as prescribed.  PRECAUTIONS:  If you experience chest pain or shortness of breath - call 911 immediately for transfer to the hospital emergency department.   If you develop a fever greater that 101 F, purulent drainage from wound, increased redness or drainage from wound, foul odor from the wound/dressing, or calf pain - CONTACT YOUR SURGEON.                                                   FOLLOW-UP APPOINTMENTS:  If you do not already have a post-op appointment, please call the office for an appointment to be seen by your surgeon.  Guidelines for how soon to be seen are listed in your "After Visit Summary", but are typically between 1-4 weeks after surgery.  OTHER INSTRUCTIONS:     MAKE SURE YOU:  Understand these instructions.  Get help right away if you are not doing well or get worse.    Thank you for letting us  be a part of your medical care team.  It is a privilege we respect greatly.  We hope these instructions will help you stay on track for a fast and full recovery!

## 2024-07-22 NOTE — Consult Note (Addendum)
 WOC Nurse ostomy consult note Stoma type/location: End colostomy LLQ. Stomal assessment/size: 32 mm round, red and viable, on the skin level. Peristomal assessment: Intact, not crease or folds. Note: Pt had an previous ostomy for 2 and a half year, she is well educated. Treatment options for stomal/peristomal skin: Ring # [id number]. Output no output, the ostomy bag was clean. She is using supplies from home 1 3/4. We do not have this pouch size at the hospital, only with pediatric bag. Alternative ostomy pouching: 2pc. 2 1/4 pouch (Lawson# [id number]). Skin barrier Soila # [phone]) Barrier ring Soila # [id number])  Education provided:  Pt is well educated, no necessary teaching section.  Enrolled patient in [employer] DC program: No, she can use the same bags and supplies that she already using.  WOC Nurse Consult Note: Reason for Consult: Assessed skin tear on right knee after fall. Per pt request. Wound type: Skin tear, trauma. Pressure Injury POA: NA Measurement: 1 - 2 cm x 1 cm, skin tera with partial skin flap over. 2 - total loss of the flap. Wound bed: 100% red Drainage (amount, consistency, odor) Scant amount, no odor, serous. Periwound: intact. Dressing procedure/placement/frequency: Cleanse with saline, Apply petrolatum #[id number], cover with silicon foam dressing, change every 3 days or PRN The small skin tear on top, I added a arrow to the foam dressing, showing the correct direction to remove the dressing, preserving the flap. Repeat this procedure when dressing change.   WOC team will not plan to follow further. Please reconsult if further assistance is needed. Thank-you,  Lela Holm RN, CNS, [employer], MSN.  (Phone [phone])

## 2024-07-22 NOTE — Progress Notes (Signed)
 PROGRESS NOTE    Desiree Madden  FMW:[id number] DOB: 01-01-1951 DOA: 07/20/2024 PCP: [facility]     Brief Narrative:   From admission h and p  Desiree Madden is a 73 y.o. female with medical history significant of Hypertension, hypothyroidism, CAD s/p CABG, CKDIIIb, Chronic metabolic acidosis , Paroxysmal atrial fibrillation on Eliquis .  Patient presents to ED after tripping over husbands walker this am while attempting to prepare breakfast.  Patient note no head strike no LOC  with fall. She notes currently her left hip is painful with movement but at rest  patient notes no significant pain. ON ros she notes no n/v/d/ chest pain / sob marvina magdalena or dysuria.   Assessment & Plan:   Principal Problem:   Closed left hip fracture (HCC) Active Problems:   Essential hypertension   Hypothyroidism   CKD stage 3b, GFR 30-44 ml/min (HCC)   PAF (paroxysmal atrial fibrillation) (HCC)   Chronic combined systolic and diastolic congestive heart failure (HCC)   S/P CABG x 4   CAD (coronary artery disease)   Malnutrition of moderate degree  # Left intertrochanteric hip fracture Operative repair (IM nail) on 8/13, no complications - pain control, monitor bleeding, bowel regimen - PT/OT advising home health  # CAD S/p cabg in 2015. Recent echo with preserved EF. Denies DOE, chest pain; functional status is at baseline.  - home statin  # A-fib, paroxysmal - home apixaban  on hold - cont home metop  # Acute on chronic anemia 2/2 blood loss. Hgb 7s from 9s on arrival - 1 unit prbc to maintain hgb above 8 given cad - trend  # Hyperkalemia On CKD 3b, likely 2/2 reduced po yesterday - lokelma  x1 - continue fluids/blood - repeat K this afternoon  # CKD 3b Kidney function stable - monitor - home bicarb  # Skin tear Left knee - x-ray  # Colostomy status - wound care consulted  # GAD - home xanax  prn, mirtazapine   # HTN Bp controlled - holding  home lisinopril  - cont home metop  # Hypothyroid - home synthroid    DVT prophylaxis: lovenox  ordered by ortho Code Status: full Family Communication: none at bedside  Level of care: Med-Surg Status is: Inpatient Remains inpatient appropriate because: severity of illness    Consultants:  ortho  Procedures: pending  Antimicrobials:  Peri-op    Subjective: Stable left hip pain  Objective: Vitals:   07/22/24 0744 07/22/24 1017 07/22/24 1048 07/22/24 1313  BP: (!) 134/51 103/63 106/70 (!) 113/54  Pulse: (!) 41 67 68 75  Resp: 18 17 18 17   Temp: 97.8 F (36.6 C) 98.2 F (36.8 C) 98.5 F (36.9 C) 98.6 F (37 C)  TempSrc:  Oral  Oral  SpO2: (!) 78% 96%  100%  Weight:      Height:        Intake/Output Summary (Last 24 hours) at 07/22/2024 1515 Last data filed at 07/22/2024 1415 Gross per 24 hour  Intake 1477.67 ml  Output --  Net 1477.67 ml   Filed Weights   07/20/24 0855 07/21/24 1200  Weight: 52.2 kg 49.9 kg    Examination:  General exam: Appears calm and comfortable  Respiratory system: Clear to auscultation. Respiratory effort normal. Cardiovascular system: S1 & S2 heard, RRR.   Gastrointestinal system: Abdomen is nondistended, soft and nontender. Colostmy bag in place Central nervous system: Alert and oriented. No focal neurological deficits. Distal sensation intact Extremities: palpable DPs LLE Skin: skin tear left  knee. Bandages left hip c/d/i Psychiatry: Judgement and insight appear normal. Mood & affect appropriate.     Data Reviewed: I have personally reviewed following labs and imaging studies  CBC: Recent Labs  Lab 07/20/24 1346 07/21/24 0222 07/22/24 0618  WBC 9.7 5.3 9.8  HGB 10.1* 9.2* 7.4*  HCT 32.1* 29.1* 22.7*  MCV 89.9 90.9 90.4  PLT 176 153 142*   Basic Metabolic Panel: Recent Labs  Lab 07/20/24 1346 07/21/24 0222 07/22/24 0618  NA 138 139 136  K 5.3* 4.8 5.7*  CL 108 111 108  CO2 21* 21* 20*  GLUCOSE 80 101*  188*  BUN 18 22 28*  CREATININE 1.83* 1.63* 1.96*  CALCIUM  8.9 8.4* 8.5*   GFR: Estimated Creatinine Clearance: 20.1 mL/min (A) (by C-G formula based on SCr of 1.96 mg/dL (H)). Liver Function Tests: Recent Labs  Lab 07/21/24 0222  AST 27  ALT 12  ALKPHOS 63  BILITOT 1.0  PROT 6.9  ALBUMIN  3.1*   No results for input(s): LIPASE, AMYLASE in the last 168 hours. No results for input(s): AMMONIA in the last 168 hours. Coagulation Profile: Recent Labs  Lab 07/20/24 1346  INR 1.2   Cardiac Enzymes: No results for input(s): CKTOTAL, CKMB, CKMBINDEX, TROPONINI in the last 168 hours. BNP (last 3 results) No results for input(s): PROBNP in the last 8760 hours. HbA1C: No results for input(s): HGBA1C in the last 72 hours. CBG: No results for input(s): GLUCAP in the last 168 hours. Lipid Profile: No results for input(s): CHOL, HDL, LDLCALC, TRIG, CHOLHDL, LDLDIRECT in the last 72 hours. Thyroid  Function Tests: No results for input(s): TSH, T4TOTAL, FREET4, T3FREE, THYROIDAB in the last 72 hours. Anemia Panel: No results for input(s): VITAMINB12, FOLATE, FERRITIN, TIBC, IRON, RETICCTPCT in the last 72 hours. Urine analysis:    Component Value Date/Time   COLORURINE YELLOW (A) 08/01/2023 1033   APPEARANCEUR CLOUDY (A) 08/01/2023 1033   LABSPEC 1.006 08/01/2023 1033   PHURINE 6.0 08/01/2023 1033   GLUCOSEU NEGATIVE 08/01/2023 1033   HGBUR LARGE (A) 08/01/2023 1033   BILIRUBINUR NEGATIVE 08/01/2023 1033   KETONESUR NEGATIVE 08/01/2023 1033   PROTEINUR NEGATIVE 08/01/2023 1033   NITRITE NEGATIVE 08/01/2023 1033   LEUKOCYTESUR LARGE (A) 08/01/2023 1033   Sepsis Labs: @LABRCNTIP (procalcitonin:4,lacticidven:4)  ) Recent Results (from the past 240 hours)  Surgical pcr screen     Status: None   Collection Time: 07/21/24  1:45 AM   Specimen: Nasal Mucosa; Nasal Swab  Result Value Ref Range Status   MRSA, PCR NEGATIVE  NEGATIVE Final   Staphylococcus aureus NEGATIVE NEGATIVE Final    Comment: (NOTE) The Xpert SA Assay (FDA approved for NASAL specimens in patients 73 years of age and older), is one component of a comprehensive surveillance program. It is not intended to diagnose infection nor to guide or monitor treatment. Performed at [facility], [address] [place], [place] [address]          Radiology Studies: DG HIP UNILAT WITH PELVIS 2-3 VIEWS LEFT Result Date: 07/21/2024 CLINICAL DATA:  Elective surgery. EXAM: DG HIP (WITH OR WITHOUT PELVIS) 2-3V LEFT COMPARISON:  Preoperative imaging FINDINGS: Five fluoroscopic spot views of the left hip submitted from the operating room. Femoral intramedullary nail with trans trochanteric and distal locking screw fixation of proximal femur fracture. Fracture is not well demonstrated by radiograph. Fluoroscopy time 42 seconds. Dose 5.27 mGy. IMPRESSION: Intraoperative fluoroscopy during left proximal femur fracture ORIF. Electronically Signed   By: Andrea Gasman  M.D.   On: 07/21/2024 14:03   DG C-Arm 1-60 Min-No Report Result Date: 07/21/2024 Fluoroscopy was utilized by the requesting physician.  No radiographic interpretation.   Chest Portable 1 View Result Date: 07/20/2024 CLINICAL DATA:  Hip fracture. EXAM: PORTABLE CHEST 1 VIEW COMPARISON:  August 02, 2023 FINDINGS: Multiple sternal wires and vascular clips are seen. The heart size and mediastinal contours are within normal limits. Mild atelectatic changes are noted within the left lung base. No pleural effusion or pneumothorax is identified. Evidence of prior vertebroplasty is seen within the midthoracic spine. No acute osseous abnormalities are identified. IMPRESSION: 1. Evidence of prior median sternotomy/CABG. 2. Mild left basilar atelectasis. Electronically Signed   By: Suzen Dials M.D.   On: 07/20/2024 20:50        Scheduled Meds:  acetaminophen   1,000 mg Oral Q8H    atorvastatin   20 mg Oral QHS   docusate sodium   100 mg Oral BID   enoxaparin  (LOVENOX ) injection  30 mg Subcutaneous Q24H   levothyroxine   75 mcg Oral Q0600   metoprolol  succinate  25 mg Oral Daily   mirtazapine   15 mg Oral QHS   pantoprazole   40 mg Oral Daily   sodium bicarbonate   650 mg Oral Daily   Continuous Infusions:  sodium chloride  Stopped (07/21/24 2210)     LOS: 2 days     Devaughn KATHEE Ban, MD [facility]   If 7PM-7AM, please contact night-coverage [email or web address] Password [facility] 07/22/2024, 3:15 PM

## 2024-07-22 NOTE — Anesthesia Preprocedure Evaluation (Signed)
 Anesthesia Evaluation  Patient identified by MRN, date of birth, ID band Patient awake    Reviewed: Allergy & Precautions, H&P , NPO status , Patient's Chart, lab work & pertinent test results, reviewed documented beta blocker date and time   Airway Mallampati: II   Neck ROM: full    Dental  (+) Poor Dentition   Pulmonary pneumonia, resolved, former smoker   Pulmonary exam normal        Cardiovascular Exercise Tolerance: Poor hypertension, On Medications + CAD and +CHF  Normal cardiovascular exam Rhythm:regular Rate:Normal     Neuro/Psych   Anxiety      Neuromuscular disease  negative psych ROS   GI/Hepatic negative GI ROS, Neg liver ROS,,,  Endo/Other  Hypothyroidism    Renal/GU Renal disease  negative genitourinary   Musculoskeletal   Abdominal   Peds  Hematology  (+) Blood dyscrasia, anemia   Anesthesia Other Findings Past Medical History: No date: Hypertension No date: Thyroid  disease Past Surgical History: No date: CARDIAC SURGERY 03/01/2021: COLECTOMY WITH COLOSTOMY CREATION/HARTMANN PROCEDURE; Left     Comment:  Procedure: COLECTOMY WITH COLOSTOMY CREATION/HARTMANN               PROCEDURE;  Surgeon: Lane Shope, MD;  Location:               [facility] ORS;  Service: General;  Laterality: Left; 03/09/2021: COLOSTOMY REVISION; N/A     Comment:  Procedure: COLOSTOMY REVISION;  Surgeon: Lane Shope, MD;  Location: [facility] ORS;  Service: General;                Laterality: N/A; 03/08/2021: TRACHEOSTOMY TUBE PLACEMENT; N/A     Comment:  Procedure: TRACHEOSTOMY;  Surgeon: Blair Mt, MD;                Location: [facility] ORS;  Service: ENT;  Laterality: N/A; BMI    Body Mass Index: 18.88 kg/m     Reproductive/Obstetrics negative OB ROS                              Anesthesia Physical Anesthesia Plan  ASA: 3 and emergent  Anesthesia Plan: General   Post-op Pain  Management:    Induction:   PONV Risk Score and Plan: 4 or greater  Airway Management Planned:   Additional Equipment:   Intra-op Plan:   Post-operative Plan:   Informed Consent: I have reviewed the patients History and Physical, chart, labs and discussed the procedure including the risks, benefits and alternatives for the proposed anesthesia with the patient or authorized representative who has indicated his/her understanding and acceptance.     Dental Advisory Given  Plan Discussed with: CRNA  Anesthesia Plan Comments:         Anesthesia Quick Evaluation

## 2024-07-22 NOTE — Plan of Care (Signed)

## 2024-07-22 NOTE — Progress Notes (Signed)
  Subjective: 1 Day Post-Op Procedure(s) (LRB): FIXATION, FRACTURE, INTERTROCHANTERIC, WITH INTRAMEDULLARY ROD (Left) Patient reports pain as mild.   Patient is well, and has had no acute complaints or problems Plan is to go Home after hospital stay. Negative for chest pain and shortness of breath Fever: no Gastrointestinal: Negative for nausea and vomiting  Objective: Vital signs in last 24 hours: Temp:  [97 F (36.1 C)-99.2 F (37.3 C)] 97 F (36.1 C) (08/14 0525) Pulse Rate:  [63-123] 75 (08/14 0525) Resp:  [10-18] 18 (08/14 0525) BP: (101-156)/(57-107) 116/63 (08/14 0525) SpO2:  [84 %-100 %] 99 % (08/14 0525) Weight:  [49.9 kg] 49.9 kg (08/13 1200)  Intake/Output from previous day:  Intake/Output Summary (Last 24 hours) at 07/22/2024 0729 Last data filed at 07/22/2024 9392 Gross per 24 hour  Intake 1485 ml  Output 50 ml  Net 1435 ml    Intake/Output this shift: No intake/output data recorded.  Labs: Recent Labs    07/20/24 1346 07/21/24 0222 07/22/24 0618  HGB 10.1* 9.2* 7.4*   Recent Labs    07/21/24 0222 07/22/24 0618  WBC 5.3 9.8  RBC 3.20* 2.51*  HCT 29.1* 22.7*  PLT 153 142*   Recent Labs    07/21/24 0222 07/22/24 0618  NA 139 136  K 4.8 5.7*  CL 111 108  CO2 21* 20*  BUN 22 28*  CREATININE 1.63* 1.96*  GLUCOSE 101* 188*  CALCIUM  8.4* 8.5*   Recent Labs    07/20/24 1346  INR 1.2     EXAM General - Patient is Alert and Oriented Extremity - Neurovascular intact Sensation intact distally Dorsiflexion/Plantar flexion intact Dressing/Incision - clean, dry, no drainage Motor Function - intact, moving foot and toes well on exam.  Up to chair  Past Medical History:  Diagnosis Date   Hypertension    Thyroid  disease     Assessment/Plan: 1 Day Post-Op Procedure(s) (LRB): FIXATION, FRACTURE, INTERTROCHANTERIC, WITH INTRAMEDULLARY ROD (Left) Principal Problem:   Closed left hip fracture (HCC) Active Problems:   Essential  hypertension   Hypothyroidism   CKD stage 3b, GFR 30-44 ml/min (HCC)   PAF (paroxysmal atrial fibrillation) (HCC)   Chronic combined systolic and diastolic congestive heart failure (HCC)   S/P CABG x 4   CAD (coronary artery disease)   Malnutrition of moderate degree  Estimated body mass index is 18.88 kg/m as calculated from the following:   Height as of this encounter: 5' 4 (1.626 m).   Weight as of this encounter: 49.9 kg. Advance diet Up with therapy D/C IV fluids Discharge home with home health  DVT Prophylaxis - Lovenox  and TED hose Weight-Bearing as tolerated to left leg  Krystal Doyne, PA-C Orthopaedic Surgery 07/22/2024, 7:29 AM

## 2024-07-22 NOTE — Anesthesia Postprocedure Evaluation (Signed)
 Anesthesia Post Note  Patient: Desiree Madden  Procedure(s) Performed: FIXATION, FRACTURE, INTERTROCHANTERIC, WITH INTRAMEDULLARY ROD (Left: Hip)  Patient location during evaluation: PACU Anesthesia Type: General Level of consciousness: awake and alert Pain management: pain level controlled Vital Signs Assessment: post-procedure vital signs reviewed and stable Respiratory status: spontaneous breathing, nonlabored ventilation, respiratory function stable and patient connected to nasal cannula oxygen Cardiovascular status: blood pressure returned to baseline and stable Postop Assessment: no apparent nausea or vomiting Anesthetic complications: no   No notable events documented.   Last Vitals:  Vitals:   07/22/24 0045 07/22/24 0525  BP: (!) 106/57 116/63  Pulse: 67 75  Resp: 18 18  Temp: 36.5 C (!) 36.1 C  SpO2: 100% 99%    Last Pain:  Vitals:   07/22/24 0703  TempSrc:   PainSc: 8                  Lynwood KANDICE Clause

## 2024-07-22 NOTE — Evaluation (Signed)
 Physical Therapy Evaluation Patient Details Name: Desiree Madden MRN: [id number] DOB: 11/09/1951 Today's Date: 07/22/2024  History of Present Illness  73 y/o female s/p fall and L hip fracture.  ORIF with IM nailing 8/13.  Clinical Impression  Pt did well POD1 PT session and was able to do all exercises, generally with light resistance and managed a prolonged bout of ambulation with walker showing minimal hesitation or fatigue despite low Hgb (7.4) levels, to have transfusion later today.  Overall pt continues to make good gains, continue with POC per protocols.      If plan is discharge home, recommend the following: Assistance with cooking/housework;Assist for transportation;Help with stairs or ramp for entrance;A little help with bathing/dressing/bathroom   Can travel by private vehicle        Equipment Recommendations None recommended by PT  Recommendations for Other Services       Functional Status Assessment       Precautions / Restrictions Precautions Precautions: Fall Restrictions RLE Weight Bearing Per Provider Order: Weight bearing as tolerated      Mobility  Bed Mobility Overal bed mobility: Needs Assistance Bed Mobility: Supine to Sit     Supine to sit: Contact guard          Transfers Overall transfer level: Needs assistance Equipment used: Rolling walker (2 wheels) Transfers: Sit to/from Stand Sit to Stand: Contact guard assist           General transfer comment: cuing for set up, sequencing and AD use, no phyiscal assist from standard height bed    Ambulation/Gait Ambulation/Gait assistance: Supervision Gait Distance (Feet): 200 Feet Assistive device: Rolling walker (2 wheels)         General Gait Details: Less hesitation today, good overall tolerance for WBing and able to maintain a consistent cadence with prolonged ambulation  Stairs Stairs: Yes Stairs assistance: Supervision Stair Management: Two rails Number of Stairs:  4 General stair comments: Pt was able to recall appropriate strategy, negotiated up/down without direct assist  Wheelchair Mobility     Tilt Bed    Modified Rankin (Stroke Patients Only)       Balance Overall balance assessment: Modified Independent                                           Pertinent Vitals/Pain Pain Assessment Pain Score: 5  Pain Location: L hip    Home Living                          Prior Function                       Extremity/Trunk Assessment                Communication   Communication Communication: No apparent difficulties    Cognition Arousal: Alert Behavior During Therapy: WFL for tasks assessed/performed   PT - Cognitive impairments: No apparent impairments                         Following commands: Intact       Cueing Cueing Techniques: Verbal cues, Visual cues     General Comments      Exercises General Exercises - Lower Extremity Ankle Circles/Pumps: AROM, 10 reps Quad Sets: Strengthening, 10 reps Heel Slides: AROM, Strengthening,  10 reps (with resisted leg ext) Hip ABduction/ADduction: Strengthening, 10 reps Straight Leg Raises: AROM, 10 reps   Assessment/Plan    PT Assessment    PT Problem List         PT Treatment Interventions      PT Goals (Current goals can be found in the Care Plan section)       Frequency BID     Co-evaluation               AM-PAC PT 6 Clicks Mobility  Outcome Measure Help needed turning from your back to your side while in a flat bed without using bedrails?: None Help needed moving from lying on your back to sitting on the side of a flat bed without using bedrails?: None Help needed moving to and from a bed to a chair (including a wheelchair)?: A Little Help needed standing up from a chair using your arms (e.g., wheelchair or bedside chair)?: A Little Help needed to walk in hospital room?: A Little Help needed  climbing 3-5 steps with a railing? : A Little 6 Click Score: 20    End of Session Equipment Utilized During Treatment: Gait belt Activity Tolerance: Patient tolerated treatment well Patient left: with chair alarm set;with call bell/phone within reach;with family/visitor present Nurse Communication: Mobility status PT Visit Diagnosis: Muscle weakness (generalized) (M62.81);Difficulty in walking, not elsewhere classified (R26.2);Pain Pain - Right/Left: Left Pain - part of body: Hip    Time: 9153-9086 PT Time Calculation (min) (ACUTE ONLY): 27 min   Charges:     PT Treatments $Gait Training: 8-22 mins $Therapeutic Exercise: 8-22 mins PT General Charges $$ ACUTE PT VISIT: 1 Visit         Carmin JONELLE Deed, DPT 07/22/2024, 10:56 AM

## 2024-07-22 NOTE — Evaluation (Signed)
 Occupational Therapy Evaluation Patient Details Name: Desiree Madden MRN: [id number] DOB: [birth year] Today's Date: 07/22/2024   History of Present Illness   73 y/o female s/p fall and L hip fracture.  ORIF with IM nailing 8/13.     Clinical Impressions Pt was seen for OT evaluation this date. 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 Pt presents with deficits in ROM, strength, affecting optimal ADL completion and mobility. Pt currently requires PRN MIN A for LB ADL tasks. ADL mobility deferred 2/2 RN setting up for blood transfusion. Pt edu in home/routines modifications, falls prevention, AE/DME, and pet care considerations. Handout provided. Pt verbalized understanding. Denied additional questions/concerns/needs. Do not anticipate the need for follow up OT services upon acute hospital DC. Will sign off.     If plan is discharge home, recommend the following:   A little help with bathing/dressing/bathroom;Assistance with cooking/housework;Assist for transportation;Help with stairs or ramp for entrance     Functional Status Assessment   Patient has had a recent decline in their functional status and demonstrates the ability to make significant improvements in function in a reasonable and predictable amount of time.     Equipment Recommendations   None recommended by OT     Recommendations for Other Services         Precautions/Restrictions   Precautions Precautions: Fall Restrictions Weight Bearing Restrictions Per Provider Order: Yes RLE Weight Bearing Per Provider Order: Weight bearing as tolerated     Mobility Bed Mobility               General bed mobility comments: deferred 2/2 RN preparing for blood transfusion    Transfers                          Balance                                           ADL either performed or assessed with clinical judgement   ADL  Overall ADL's : Needs assistance/impaired                                       General ADL Comments: Pt currently requires PRN MIN A for LB ADL tasks 2/2 L hip pain with bending. Pt reports family will be able to assist.     Vision         Perception         Praxis         Pertinent Vitals/Pain Pain Assessment Pain Assessment: No/denies pain     Extremity/Trunk Assessment Upper Extremity Assessment Upper Extremity Assessment: Overall WFL for tasks assessed   Lower Extremity Assessment Lower Extremity Assessment: Defer to PT evaluation;Overall WFL for tasks assessed;LLE deficits/detail LLE Deficits / Details: expected post-op weakness, no L SLR       Communication Communication Communication: No apparent difficulties   Cognition Arousal: Alert Behavior During Therapy: WFL for tasks assessed/performed Cognition: No apparent impairments                               Following commands: Intact       Cueing  General Comments  Exercises Other Exercises Other Exercises: Pt edu in home/routines modifications, falls prevention, AE/DME, and pet care considerations   Shoulder Instructions      Home Living Family/patient expects to be discharged to:: Private residence Living Arrangements: Spouse/significant other Available Help at Discharge: Available PRN/intermittently (pt is caregiver for her husband, daughter & S-in-L live locally and can help PRN) Type of Home: Mobile home Home Access: Stairs to enter Entrance Stairs-Number of Steps: 3 Entrance Stairs-Rails: Can reach both Home Layout: One level     Bathroom Shower/Tub: Tub/shower unit         Home Equipment: Agricultural consultant (2 wheels);Grab bars - toilet;Shower seat;Grab bars - tub/shower;Toilet riser;Adaptive equipment Adaptive Equipment: Reacher        Prior Functioning/Environment Prior Level of Function : Independent/Modified Independent;Driving              Mobility Comments: Pt typically driving, active, cares for husband, etc w/o AD ADLs Comments: independent    OT Problem List: Decreased strength;Decreased range of motion   OT Treatment/Interventions:        OT Goals(Current goals can be found in the care plan section)   Acute Rehab OT Goals Patient Stated Goal: go home OT Goal Formulation: All assessment and education complete, DC therapy   OT Frequency:       Co-evaluation              AM-PAC OT 6 Clicks Daily Activity     Outcome Measure Help from another person eating meals?: None Help from another person taking care of personal grooming?: None Help from another person toileting, which includes using toliet, bedpan, or urinal?: A Little Help from another person bathing (including washing, rinsing, drying)?: A Little Help from another person to put on and taking off regular upper body clothing?: None Help from another person to put on and taking off regular lower body clothing?: A Little 6 Click Score: 21   End of Session    Activity Tolerance: Patient tolerated treatment well Patient left: in bed;with call bell/phone within reach;with bed alarm set;with nursing/sitter in room  OT Visit Diagnosis: Other abnormalities of gait and mobility (R26.89)                Time: 1016-1030 OT Time Calculation (min): 14 min Charges:  OT General Charges $OT Visit: 1 Visit OT Evaluation $OT Eval Low Complexity: 1 Low OT Treatments $Self Care/Home Management : 8-22 mins  Warren SAUNDERS., MPH, MS, OTR/L ascom [phone] 07/22/24, 12:02 PM

## 2024-07-22 NOTE — Progress Notes (Signed)
 Physical Therapy Treatment Patient Details Name: Desiree Madden MRN: [id number] DOB: April 04, 1951 Today's Date: 07/22/2024   History of Present Illness 73 y/o female s/p fall and L hip fracture.  ORIF with IM nailing 8/13.    PT Comments  Pt continues to be pleasant and motivated, showed good effort with PT session and was able to confidently and easily circumambulate the nurses' station safely and with no c/o increased pain or fatigue.  Pt is eager to go home as soon as medically cleared; safe from a PT perspective.  Continue with POC per post-op protocols.     If plan is discharge home, recommend the following: Assistance with cooking/housework;Assist for transportation;Help with stairs or ramp for entrance;A little help with bathing/dressing/bathroom   Can travel by private vehicle        Equipment Recommendations  None recommended by PT    Recommendations for Other Services       Precautions / Restrictions Precautions Precautions: Fall Restrictions RLE Weight Bearing Per Provider Order: Weight bearing as tolerated     Mobility  Bed Mobility Overal bed mobility: Modified Independent Bed Mobility: Supine to Sit, Sit to Supine     Supine to sit: Supervision Sit to supine: Supervision   General bed mobility comments: Pt did well, able to get to sitting w/o assist, back to supine without issue    Transfers Overall transfer level: Modified independent Equipment used: Rolling walker (2 wheels) Transfers: Sit to/from Stand Sit to Stand: Supervision           General transfer comment: easily rises to standing with light cuing for UE use/set up    Ambulation/Gait Ambulation/Gait assistance: Supervision Gait Distance (Feet): 250 Feet Assistive device: Rolling walker (2 wheels)         General Gait Details: Pt able to assume confident and consistent cadence with appropriate walker reliance and good speed.  Pt with no fatigue, lightheadedness, etc and  generally reports feeling very confident with the effort.  Deferred stairs this afternoon as pt did them very well this AM and reports she feels very confident.   Stairs Stairs: Yes Stairs assistance: Supervision Stair Management: Two rails Number of Stairs: 4 General stair comments: Pt was able to recall appropriate strategy, negotiated up/down without direct assist   Wheelchair Mobility     Tilt Bed    Modified Rankin (Stroke Patients Only)       Balance Overall balance assessment: Modified Independent                                          Communication    Cognition Arousal: Alert Behavior During Therapy: WFL for tasks assessed/performed   PT - Cognitive impairments: No apparent impairments                         Following commands: Intact      Cueing Cueing Techniques: Verbal cues, Visual cues  Exercises General Exercises - Lower Extremity Ankle Circles/Pumps: AROM, 10 reps Quad Sets: Strengthening, 10 reps Heel Slides: AROM, Strengthening, 10 reps (with resisted leg ext) Hip ABduction/ADduction: Strengthening, 10 reps Straight Leg Raises: AROM, 10 reps    General Comments        Pertinent Vitals/Pain Pain Assessment Pain Assessment: 0-10 Pain Score: 4  Pain Location: L hip    Home Living Family/patient expects to be discharged to:: Private  residence Living Arrangements: Spouse/significant other Available Help at Discharge: Available PRN/intermittently (pt is caregiver for her husband, daughter & S-in-L live locally and can help PRN) Type of Home: Mobile home Home Access: Stairs to enter Entrance Stairs-Rails: Can reach both Entrance Stairs-Number of Steps: 3   Home Layout: One level Home Equipment: Rolling Walker (2 wheels);Grab bars - toilet;Shower seat;Grab bars - tub/shower;Toilet riser;Adaptive equipment      Prior Function            PT Goals (current goals can now be found in the care plan section)  Progress towards PT goals: Progressing toward goals    Frequency    BID      PT Plan      Co-evaluation              AM-PAC PT 6 Clicks Mobility   Outcome Measure  Help needed turning from your back to your side while in a flat bed without using bedrails?: None Help needed moving from lying on your back to sitting on the side of a flat bed without using bedrails?: None Help needed moving to and from a bed to a chair (including a wheelchair)?: None Help needed standing up from a chair using your arms (e.g., wheelchair or bedside chair)?: None Help needed to walk in hospital room?: None Help needed climbing 3-5 steps with a railing? : A Little 6 Click Score: 23    End of Session Equipment Utilized During Treatment: Gait belt Activity Tolerance: Patient tolerated treatment well Patient left: with chair alarm set;with call bell/phone within reach;with family/visitor present Nurse Communication: Mobility status PT Visit Diagnosis: Muscle weakness (generalized) (M62.81);Difficulty in walking, not elsewhere classified (R26.2);Pain Pain - Right/Left: Left Pain - part of body: Hip     Time: 1445-1510 PT Time Calculation (min) (ACUTE ONLY): 25 min  Charges:    $Gait Training: 8-22 mins $Therapeutic Exercise: 8-22 mins PT General Charges $$ ACUTE PT VISIT: 1 Visit                     Carmin JONELLE Deed, DPT 07/22/2024, 3:44 PM

## 2024-07-23 DIAGNOSIS — S72002A Fracture of unspecified part of neck of left femur, initial encounter for closed fracture: Secondary | ICD-10-CM | POA: Diagnosis not present

## 2024-07-23 LAB — TYPE AND SCREEN
ABO/RH(D): O POS
Antibody Screen: NEGATIVE
Unit division: 0

## 2024-07-23 LAB — BASIC METABOLIC PANEL WITH GFR
Anion gap: 5 (ref 5–15)
BUN: 29 mg/dL — ABNORMAL HIGH (ref 8–23)
CO2: 21 mmol/L — ABNORMAL LOW (ref 22–32)
Calcium: 8.5 mg/dL — ABNORMAL LOW (ref 8.9–10.3)
Chloride: 111 mmol/L (ref 98–111)
Creatinine, Ser: 1.8 mg/dL — ABNORMAL HIGH (ref 0.44–1.00)
GFR, Estimated: 29 mL/min — ABNORMAL LOW (ref >60)
Glucose, Bld: 87 mg/dL (ref 70–99)
Potassium: 4.8 mmol/L (ref 3.5–5.1)
Sodium: 137 mmol/L (ref 135–145)

## 2024-07-23 LAB — BPAM RBC
Blood Product Expiration Date: 202509172359
ISSUE DATE / TIME: 202508141027
Unit Type and Rh: 5100

## 2024-07-23 LAB — CBC
HCT: 27.1 % — ABNORMAL LOW (ref 36.0–46.0)
Hemoglobin: 9.1 g/dL — ABNORMAL LOW (ref 12.0–15.0)
MCH: 29.7 pg (ref 26.0–34.0)
MCHC: 33.6 g/dL (ref 30.0–36.0)
MCV: 88.6 fL (ref 80.0–100.0)
Platelets: 124 K/uL — ABNORMAL LOW (ref 150–400)
RBC: 3.06 MIL/uL — ABNORMAL LOW (ref 3.87–5.11)
RDW: 13.5 % (ref 11.5–15.5)
WBC: 7 K/uL (ref 4.0–10.5)
nRBC: 0 % (ref 0.0–0.2)

## 2024-07-23 NOTE — Progress Notes (Signed)
 Physical Therapy Treatment Patient Details Name: Desiree Madden MRN: [id number] DOB: 1951/04/25 Today's Date: 07/23/2024   History of Present Illness 72 y/o female s/p fall and L hip fracture.  ORIF with IM nailing 8/13.    PT Comments  Pt was long sitting in bed upon arrival. She is A and O x 4. Very pleasant and motivated. Easily and safely able to exit bed, stand and tolerate ambulation 2 laps in hallway. Discussed post acute care needs and importance of continuing routine mobility. Pt states confidence in safe DC home once cleared medically. Pt has all DME needs met. Pt is cleared from an acute PT standpoint for safe DC home with HHPT to follow.    If plan is discharge home, recommend the following: Assistance with cooking/housework;Assist for transportation;Help with stairs or ramp for entrance;A little help with bathing/dressing/bathroom     Equipment Recommendations  None recommended by PT       Precautions / Restrictions Precautions Precautions: Fall Restrictions Weight Bearing Restrictions Per Provider Order: Yes RLE Weight Bearing Per Provider Order: Weight bearing as tolerated     Mobility  Bed Mobility Overal bed mobility: Modified Independent Bed Mobility: Supine to Sit, Sit to Supine  Supine to sit: Supervision Sit to supine: Supervision General bed mobility comments: Pt did well, able to get to sitting w/o assist, back to supine without issue    Transfers Overall transfer level: Modified independent Equipment used: Rolling walker (2 wheels) Transfers: Sit to/from Stand Sit to Stand: Supervision  General transfer comment: easily rises to standing with light cuing for UE use/set up    Ambulation/Gait Ambulation/Gait assistance: Supervision Gait Distance (Feet): 400 Feet Assistive device: Rolling walker (2 wheels) Gait Pattern/deviations: Step-through pattern  General Gait Details: pt was able to ambulate 2 laps around RN station without physical  assistance. VCs for upright posture and and heel strike improvements   Stairs  General stair comments: reviewed stair performance. pt correctly states proper sequencing     Balance Overall balance assessment: Modified Independent       Communication    Cognition Arousal: Alert Behavior During Therapy: WFL for tasks assessed/performed   PT - Cognitive impairments: No apparent impairments      Following commands: Intact         Exercises General Exercises - Lower Extremity Quad Sets: Strengthening, 10 reps Heel Slides: AROM, Strengthening, 10 reps (with resisted leg ext) Hip ABduction/ADduction: Strengthening, 10 reps Straight Leg Raises: AROM, 10 reps        Pertinent Vitals/Pain Pain Assessment Pain Assessment: 0-10 Pain Score: 2  Pain Location: L hip Pain Descriptors / Indicators: Sore     PT Goals (current goals can now be found in the care plan section) Acute Rehab PT Goals Patient Stated Goal: go home ASAP Progress towards PT goals: Progressing toward goals    Frequency    BID       AM-PAC PT 6 Clicks Mobility   Outcome Measure  Help needed turning from your back to your side while in a flat bed without using bedrails?: None Help needed moving from lying on your back to sitting on the side of a flat bed without using bedrails?: None Help needed moving to and from a bed to a chair (including a wheelchair)?: None Help needed standing up from a chair using your arms (e.g., wheelchair or bedside chair)?: None Help needed to walk in hospital room?: None Help needed climbing 3-5 steps with a railing? : A Little 6  Click Score: 23    End of Session   Activity Tolerance: Patient tolerated treatment well Patient left: with chair alarm set;with call bell/phone within reach;with family/visitor present Nurse Communication: Mobility status PT Visit Diagnosis: Muscle weakness (generalized) (M62.81);Difficulty in walking, not elsewhere classified  (R26.2);Pain Pain - Right/Left: Left Pain - part of body: Hip     Time: 9259-9195 PT Time Calculation (min) (ACUTE ONLY): 24 min  Charges:    $Gait Training: 8-22 mins $Therapeutic Activity: 8-22 mins PT General Charges $$ ACUTE PT VISIT: 1 Visit                    Rankin Essex PTA 07/23/24, 8:23 AM

## 2024-07-23 NOTE — Plan of Care (Signed)

## 2024-07-23 NOTE — Discharge Summary (Signed)
 Desiree Madden [place] FMW:[id number] DOB: 1951/10/28 DOA: 07/20/2024  PCP: [facility]  Admit date: 07/20/2024 Discharge date: 07/23/2024  Time spent: 35 minutes  Recommendations for Outpatient Follow-up:  Pcp f/u Orthopedics f/u 2 weeks     Discharge Diagnoses:  Principal Problem:   Closed left hip fracture [facility]) Active Problems:   Essential hypertension   Hypothyroidism   CKD stage 3b, GFR 30-44 ml/min (HCC)   PAF (paroxysmal atrial fibrillation) (HCC)   Chronic combined systolic and diastolic congestive heart failure (HCC)   S/P CABG x 4   CAD (coronary artery disease)   Malnutrition of moderate degree   Discharge Condition: improved  Diet recommendation: heart healthy  Filed Weights   07/20/24 0855 07/21/24 1200  Weight: 52.2 kg 49.9 kg    History of present illness:  From admission h and p Desiree Madden is a 73 y.o. female with medical history significant of Hypertension, hypothyroidism, CAD s/p CABG, CKDIIIb, Chronic metabolic acidosis , Paroxysmal atrial fibrillation on Eliquis .  Patient presents to ED after tripping over husbands walker this am while attempting to prepare breakfast.  Patient note no head strike no LOC  with fall. She notes currently her left hip is painful with movement but at rest  patient notes no significant pain. ON ros she notes no n/v/d/ chest pain / sob Desiree Madden or dysuria.   Hospital Course:   # Left intertrochanteric hip fracture Operative repair (IM nail) on 8/13, no complications - PT/OT advising home health, ordered. Has all the equipment she needs - ortho f/u 2 weeks - wbat   # CAD S/p cabg in 2015. Recent echo with preserved EF. Denies DOE, chest pain; functional status is at baseline.  - home statin   # A-fib, paroxysmal - resume home apixaban  - cont home metop   # Acute on chronic anemia 2/2 blood loss. Hgb 7s from 9s on arrival. Transfused 1 unit, hgb with appropriate rise to 9s and stable   #  Hyperkalemia On CKD 3b, resolved   # CKD 3b Kidney function stable   # Skin tear Left knee - local wound care   # Colostomy status stable   # GAD - home xanax  prn, mirtazapine    # HTN Bp controlled    Procedures: See above   Consultations: orthopedics  Discharge Exam: Vitals:   07/23/24 0436 07/23/24 0718  BP: 116/64 132/61  Pulse: 68 65  Resp: 18 16  Temp: 98.2 F (36.8 C) 98.4 F (36.9 C)  SpO2: 100% 98%    General: NAD Cardiovascular: RRR Respiratory: CTAB Ext: warm, c/d/I dressing left lateral hip  Discharge Instructions   Discharge Instructions     Diet general   Complete by: As directed    Increase activity slowly   Complete by: As directed    Leave dressing on - Keep it clean, dry, and intact until clinic visit   Complete by: As directed       Allergies as of 07/23/2024       Reactions   Cymbalta  [duloxetine  Hcl] Nausea And Vomiting   Baclofen    Other reaction(s): Headache Other reaction(s): Headache, Other (See Comments) Other reaction(s): Headache Other reaction(s): Headache   Sertraline    Other reaction(s): Other (See Comments), Other (See Comments) Muscle aches Muscle aches Other reaction(s): Other (See Comments), Other (See Comments) Muscle aches Other reaction(s): Other (See Comments), Other (See Comments) Muscle aches Muscle aches Muscle aches Muscle aches Other reaction(s): Other (See Comments), Other (See Comments)  Muscle aches Muscle aches   Sulfa Antibiotics    Other reaction(s): Other (See Comments), Other (See Comments) Pt was told not to take it due to colitis Pt was told not to take it due to colitis   Ibuprofen Nausea And Vomiting   Other reaction(s): Other (See Comments) Due to stage 3 kidney disease   Mirtazapine     Other reaction(s): Other (See Comments), Other (See Comments) irritability irritability   Trazodone  Diarrhea   Trazodone  And Nefazodone         Medication List     TAKE these  medications    acetaminophen  325 MG tablet Commonly known as: TYLENOL  Take 1-2 tablets (325-650 mg total) by mouth every 4 (four) hours as needed for mild pain.   albuterol  108 (90 Base) MCG/ACT inhaler Commonly known as: VENTOLIN  HFA albuterol  sulfate HFA 90 mcg/actuation aerosol inhaler  Inhale 2 puffs every 4 hours by inhalation route.   ALPRAZolam  0.25 MG tablet Commonly known as: XANAX  Take 1 tablet (0.25 mg total) by mouth 2 (two) times daily as needed for anxiety or sleep.   ascorbic acid  500 MG tablet Commonly known as: VITAMIN C  Take 1 tablet (500 mg total) by mouth 2 (two) times daily.   atorvastatin  20 MG tablet Commonly known as: LIPITOR Take 1 tablet (20 mg total) by mouth daily.   azelastine  0.1 % nasal spray Commonly known as: ASTELIN  azelastine  137 mcg (0.1 %) nasal spray aerosol   calcitRIOL 0.25 MCG capsule Commonly known as: ROCALTROL Take 0.25 mcg by mouth.  Take 1 capsule (0.25 mcg total) by mouth 1 (one) time each day   cetirizine 10 MG tablet Commonly known as: ZYRTEC cetirizine 10 mg tablet  Take 1 tablet every day by oral route.   chlorhexidine  gluconate (MEDLINE KIT) 0.12 % solution Commonly known as: PERIDEX  15 mLs by Mouth Rinse route 2 (two) times daily.   ciclopirox 8 % solution Commonly known as: PENLAC Apply 1 Application topically at bedtime.   Eliquis  2.5 MG Tabs tablet Generic drug: apixaban  Take 2.5 mg by mouth 2 (two) times daily.   ferrous sulfate 325 (65 FE) MG EC tablet Take 325 mg by mouth.  Take 325 mg by mouth every other day   fluticasone  50 MCG/ACT nasal spray Commonly known as: FLONASE  Place 2 sprays into both nostrils daily.   levothyroxine  75 MCG tablet Commonly known as: SYNTHROID  Take 75 mcg by mouth daily.   lisinopril  5 MG tablet Commonly known as: ZESTRIL  Take 5 mg by mouth daily.   metoprolol  succinate 25 MG 24 hr tablet Commonly known as: TOPROL -XL Take 25 mg by mouth daily.   mirtazapine  15 MG  tablet Commonly known as: REMERON  Take 1 tablet (15 mg total) by mouth at bedtime.   multivitamin with minerals Tabs tablet Take 1 tablet by mouth daily.   NEW IMAGE DRAINABLE POUCH Pouch Misc Use 1 Bag 2 (two) times daily   nitroGLYCERIN  0.4 MG SL tablet Commonly known as: NITROSTAT  Place under the tongue.   oxyCODONE  5 MG immediate release tablet Commonly known as: Oxy IR/ROXICODONE  Take 0.5-1 tablets (2.5-5 mg total) by mouth every 4 (four) hours as needed for moderate pain (pain score 4-6) (pain score 4-6). What changed:  how much to take when to take this reasons to take this   pantoprazole  40 MG tablet Commonly known as: PROTONIX  Take 1 tablet (40 mg total) by mouth daily.   simethicone 80 MG chewable tablet Commonly known as: MYLICON Chew 80  mg by mouth every 6 (six) hours as needed for flatulence.   sodium bicarbonate  650 MG tablet Take 650 mg by mouth daily.   traMADol  50 MG tablet Commonly known as: ULTRAM  Take 1 tablet (50 mg total) by mouth every 6 (six) hours as needed for moderate pain (pain score 4-6).               Discharge Care Instructions  (From admission, onward)           Start     Ordered   07/23/24 0000  Leave dressing on - Keep it clean, dry, and intact until clinic visit        07/23/24 1254           Allergies  Allergen Reactions   Cymbalta  [Duloxetine  Hcl] Nausea And Vomiting   Baclofen     Other reaction(s): Headache Other reaction(s): Headache, Other (See Comments) Other reaction(s): Headache Other reaction(s): Headache    Sertraline     Other reaction(s): Other (See Comments), Other (See Comments) Muscle aches Muscle aches  Other reaction(s): Other (See Comments), Other (See Comments) Muscle aches Other reaction(s): Other (See Comments), Other (See Comments) Muscle aches Muscle aches Muscle aches Muscle aches Other reaction(s): Other (See Comments), Other (See Comments) Muscle aches Muscle aches     Sulfa Antibiotics     Other reaction(s): Other (See Comments), Other (See Comments) Pt was told not to take it due to colitis Pt was told not to take it due to colitis    Ibuprofen Nausea And Vomiting    Other reaction(s): Other (See Comments) Due to stage 3 kidney disease    Mirtazapine      Other reaction(s): Other (See Comments), Other (See Comments) irritability irritability    Trazodone  Diarrhea   Trazodone  And Nefazodone     Follow-up Information     Verlinda Boas, PA-C Follow up in 2 week(s).   Specialty: Orthopedic Surgery Why: For staple removal and x-rays of the left hip Contact information: [address] [place] [address] [phone]         [facility] Follow up.   Contact information: [address] [place] [address] [phone]                  The results of significant diagnostics from this hospitalization (including imaging, microbiology, ancillary and laboratory) are listed below for reference.    Significant Diagnostic Studies: DG Knee 1-2 Views Left Result Date: 07/22/2024 CLINICAL DATA:  [id number] Fall 190176 EXAM: LEFT KNEE - 1-2 VIEW COMPARISON:  None Available. FINDINGS: Osteopenia.No acute fracture or dislocation. No joint effusion. There is no evidence of arthropathy or other focal bone abnormality. Moderate amount of subcutaneous gas along the medial aspect of the distal thigh. Peripheral vascular atherosclerosis. Popliteal fossa surgical clips. IMPRESSION: 1. Osteopenia.  No acute fracture or dislocation. 2. Moderate amount of subcutaneous gas focally along the medial aspect of the distal thigh, which may be due to gas introduced by overlying laceration or gas producing infection. Correlation with physical exam findings requested. Electronically Signed   By: Rogelia Myers M.D.   On: 07/22/2024 19:55   DG HIP UNILAT WITH PELVIS 2-3 VIEWS LEFT Result Date: 07/21/2024 CLINICAL DATA:   Elective surgery. EXAM: DG HIP (WITH OR WITHOUT PELVIS) 2-3V LEFT COMPARISON:  Preoperative imaging FINDINGS: Five fluoroscopic spot views of the left hip submitted from the operating room. Femoral intramedullary nail with trans trochanteric and distal locking screw fixation  of proximal femur fracture. Fracture is not well demonstrated by radiograph. Fluoroscopy time 42 seconds. Dose 5.27 mGy. IMPRESSION: Intraoperative fluoroscopy during left proximal femur fracture ORIF. Electronically Signed   By: Andrea Gasman M.D.   On: 07/21/2024 14:03   DG C-Arm 1-60 Min-No Report Result Date: 07/21/2024 Fluoroscopy was utilized by the requesting physician.  No radiographic interpretation.   Chest Portable 1 View Result Date: 07/20/2024 CLINICAL DATA:  Hip fracture. EXAM: PORTABLE CHEST 1 VIEW COMPARISON:  August 02, 2023 FINDINGS: Multiple sternal wires and vascular clips are seen. The heart size and mediastinal contours are within normal limits. Mild atelectatic changes are noted within the left lung base. No pleural effusion or pneumothorax is identified. Evidence of prior vertebroplasty is seen within the midthoracic spine. No acute osseous abnormalities are identified. IMPRESSION: 1. Evidence of prior median sternotomy/CABG. 2. Mild left basilar atelectasis. Electronically Signed   By: Suzen Dials M.D.   On: 07/20/2024 20:50   MR HIP LEFT WO CONTRAST Result Date: 07/20/2024 CLINICAL DATA:  Fracture, hip EXAM: MR OF THE LEFT HIP WITHOUT CONTRAST TECHNIQUE: Multiplanar, multisequence MR imaging was performed. No intravenous contrast was administered. COMPARISON:  Left hip radiographs dated 07/20/2024. CT abdomen/pelvis dated 08/01/2023. FINDINGS: Bones/Hip: Nondisplaced intertrochanteric fracture of the left proximal femur with associated marrow edema and hypointense fracture line extending through the lateral facet of the left greater trochanter (series 8 and 9, image 22). No dislocation. The left hip  joint is anatomically aligned. Mild superolateral joint space narrowing and marginal osteophytosis. The labrum is grossly intact, although evaluation is limited by lack of intra-articular fluid/contrast. No appreciable focal chondral defect. Ligamentum teres and transverse ligaments are intact. No hip joint effusion. Similar relatively symmetric degenerative changes of the right hip also noted. The remainder of the visualized bones demonstrate normal marrow signal intensity. SI joints and pubic symphysis are anatomically aligned. Degenerative changes of the visualized lower lumbar spine. Soft tissue and Muscle: Subcutaneous edema overlying the posterolateral left hip. Edema of the left quadratus femoris muscle surrounding the nondisplaced left proximal femoral intertrochanteric fracture. Gluteal cuff insertions appear intact. Hamstring tendon origins are intact.Visualized intrapelvic contents are grossly unremarkable. IMPRESSION: 1. Nondisplaced intertrochanteric fracture of the left proximal femur with associated marrow edema and hypointense fracture line extending through the lateral facet of the left greater trochanter. Gluteal cuff insertions appear intact. 2. Mild osteoarthritis of the bilateral hips. Electronically Signed   By: Harrietta Sherry M.D.   On: 07/20/2024 13:07   CT Cervical Spine Wo Contrast Result Date: 07/20/2024 CLINICAL DATA:  73 year old female status post fall on Eliquis . Pain. EXAM: CT CERVICAL SPINE WITHOUT CONTRAST TECHNIQUE: Multidetector CT imaging of the cervical spine was performed without intravenous contrast. Multiplanar CT image reconstructions were also generated. RADIATION DOSE REDUCTION: This exam was performed according to the departmental dose-optimization program which includes automated exposure control, adjustment of the mA and/or kV according to patient size and/or use of iterative reconstruction technique. COMPARISON:  Head CT today.  Thoracic spine MRI 02/22/2022.  FINDINGS: Alignment: Straightening of cervical lordosis similar to the previous MRI. Mild degenerative appearing anterolisthesis of C4 on C5. Cervicothoracic junction alignment is within normal limits. Bilateral posterior element alignment is within normal limits. Skull base and vertebrae: Visualized skull base is intact. No atlanto-occipital dissociation. C1 and C2 appear intact and aligned. No acute osseous abnormality identified. Soft tissues and spinal canal: No prevertebral fluid or swelling. No visible canal hematoma. Bulky calcified cervical carotid artery atherosclerosis. Disc levels: Advanced lower cervical  spine disc and endplate degeneration, bulky at C5-C6 and C6-C7. Chronic spinal stenosis suspected at both levels. Vacuum disc at the latter. Widespread right greater than left cervical facet arthropathy. Upper chest: Visible upper thoracic levels appear intact. Negative lung apices. IMPRESSION: 1. No acute traumatic injury identified in the cervical spine. 2. Bulky chronic lower cervical spine disc and endplate degeneration with chronic spinal stenosis, C5-C6 and C6-C7. 3. Bulky calcified cervical carotid artery atherosclerosis. Electronically Signed   By: VEAR Hurst M.D.   On: 07/20/2024 11:17   CT Head Wo Contrast Result Date: 07/20/2024 CLINICAL DATA:  73 year old female status post fall on Eliquis . Pain. EXAM: CT HEAD WITHOUT CONTRAST TECHNIQUE: Contiguous axial images were obtained from the base of the skull through the vertex without intravenous contrast. RADIATION DOSE REDUCTION: This exam was performed according to the departmental dose-optimization program which includes automated exposure control, adjustment of the mA and/or kV according to patient size and/or use of iterative reconstruction technique. COMPARISON:  None Available. FINDINGS: Brain: Chronic encephalomalacia posterior right MCA territory with mild ex vacuo enlargement of the atrium of the right lateral ventricle. No midline shift,  ventriculomegaly, mass effect, evidence of mass lesion, intracranial hemorrhage or evidence of cortically based acute infarction. Outside of the right MCA territory gray-white differentiation is normal for age. Vascular: Calcified atherosclerosis at the skull base. No suspicious intracranial vascular hyperdensity. Skull: Intact.  No acute osseous abnormality identified. Sinuses/Orbits: Visualized paranasal sinuses and mastoids are clear. Other: No orbit or scalp soft tissue injury identified. IMPRESSION: 1. No acute intracranial abnormality or acute traumatic injury identified. 2. Chronic infarct with encephalomalacia posterior Right MCA territory. Electronically Signed   By: VEAR Hurst M.D.   On: 07/20/2024 11:14   DG Hip Unilat W or Wo Pelvis 2-3 Views Left Result Date: 07/20/2024 CLINICAL DATA:  Fall with hip pain. EXAM: DG HIP (WITH OR WITHOUT PELVIS) 2-3V LEFT COMPARISON:  None Available. FINDINGS: There is no evidence of hip fracture or dislocation. There is no evidence of arthropathy or other focal bone abnormality. IMPRESSION: Negative. Electronically Signed   By: Camellia Candle M.D.   On: 07/20/2024 09:48    Microbiology: Recent Results (from the past 240 hours)  Surgical pcr screen     Status: None   Collection Time: 07/21/24  1:45 AM   Specimen: Nasal Mucosa; Nasal Swab  Result Value Ref Range Status   MRSA, PCR NEGATIVE NEGATIVE Final   Staphylococcus aureus NEGATIVE NEGATIVE Final    Comment: (NOTE) The Xpert SA Assay (FDA approved for NASAL specimens in patients 6 years of age and older), is one component of a comprehensive surveillance program. It is not intended to diagnose infection nor to guide or monitor treatment. Performed at [facility], [address] [place], [place] [address]      Labs: Basic Metabolic Panel: Recent Labs  Lab 07/20/24 1346 07/21/24 0222 07/22/24 0618 07/22/24 1557 07/23/24 0538  NA 138 139 136 131* 137  K 5.3* 4.8 5.7* 4.1 4.8  CL  108 111 108 103 111  CO2 21* 21* 20* 19* 21*  GLUCOSE 80 101* 188* 96 87  BUN 18 22 28* 31* 29*  CREATININE 1.83* 1.63* 1.96* 2.09* 1.80*  CALCIUM  8.9 8.4* 8.5* 8.6* 8.5*   Liver Function Tests: Recent Labs  Lab 07/21/24 0222  AST 27  ALT 12  ALKPHOS 63  BILITOT 1.0  PROT 6.9  ALBUMIN  3.1*   No results for input(s): LIPASE, AMYLASE in the last 168 hours.  No results for input(s): AMMONIA in the last 168 hours. CBC: Recent Labs  Lab 07/20/24 1346 07/21/24 0222 07/22/24 0618 07/22/24 1557 07/23/24 0538  WBC 9.7 5.3 9.8 10.0 7.0  HGB 10.1* 9.2* 7.4* 10.2* 9.1*  HCT 32.1* 29.1* 22.7* 31.1* 27.1*  MCV 89.9 90.9 90.4 89.4 88.6  PLT 176 153 142* 152 124*   Cardiac Enzymes: No results for input(s): CKTOTAL, CKMB, CKMBINDEX, TROPONINI in the last 168 hours. BNP: BNP (last 3 results) No results for input(s): BNP in the last 8760 hours.  ProBNP (last 3 results) No results for input(s): PROBNP in the last 8760 hours.  CBG: No results for input(s): GLUCAP in the last 168 hours.     Signed:  Devaughn KATHEE Ban MD.  [facility] 07/23/2024, 12:55 PM

## 2024-07-23 NOTE — Plan of Care (Signed)

## 2024-07-23 NOTE — TOC Transition Note (Signed)
 Transition of Care [facility]) - Discharge Note   Patient Details  Name: Desiree Madden MRN: [id number] Date of Birth: 1950/12/21  Transition [facility] CM/SW Contact:  Alvaro Louder, LCSW Phone Number: 07/23/2024, 1:59 PM   Clinical Narrative:   [name] discussed PT recommendation of HH Patient was agreeable. LCSWA reached out to [facility] admissions coordinator an started HHPT and OT service for patient. The patient reported that she would have her brother come pick him up at discharge.   TOC signing off   Final next level of care: Home w Home Health Services Barriers to Discharge: No Barriers Identified   Patient Goals and CMS Choice            Discharge Placement              Patient chooses bed at:  (Home) Patient to be transferred to facility by: Brother Name of family member notified: Self Patient and family notified of of transfer: 07/23/24  Discharge Plan and Services Additional resources added to the After Visit Summary for                            [facility] Arranged: PT, OT HH Agency: [facility] Date [facility] Agency Contacted: 07/23/24   Representative spoke with at [facility] Agency: [place]   Social Drivers of Health (SDOH) Interventions SDOH Screenings   Food Insecurity: No Food Insecurity (07/20/2024)  Housing: Unknown (07/20/2024)  Transportation Needs: No Transportation Needs (07/20/2024)  Utilities: Not At Risk (07/20/2024)  Depression (PHQ2-9): Low Risk  (06/06/2021)  Recent Concern: Depression (PHQ2-9) - Medium Risk (05/08/2021)  Financial Resource Strain: Low Risk  (04/06/2024)   Received from [facility]  Social Connections: Socially Integrated (07/20/2024)  Tobacco Use: Medium Risk (07/21/2024)     Readmission Risk Interventions     No data to display

## 2024-07-23 NOTE — Care Management Important Message (Signed)
 Important Message  Patient Details  Name: Desiree Madden MRN: [id number] Date of Birth: 03/29/1951   Important Message Given:  Yes - Medicare IM     Jaron Czarnecki W, CMA 07/23/2024, 1:03 PM

## 2024-07-23 NOTE — Progress Notes (Signed)
 Reviewed discharge note with patient. Patient acknowledged understanding. Patient discharged with personal belongings. Patient wheeled out by staff. Patient transported home via family vehicle. No distress noted in patient.

## 2024-07-23 NOTE — Progress Notes (Signed)
  Subjective: 2 Days Post-Op Procedure(s) (LRB): FIXATION, FRACTURE, INTERTROCHANTERIC, WITH INTRAMEDULLARY ROD (Left) Patient reports pain as mild.   Patient is well, and has had no acute complaints or problems Plan is to go Home after hospital stay. Negative for chest pain and shortness of breath Fever: no Gastrointestinal: Negative for nausea and vomiting  Objective: Vital signs in last 24 hours: Temp:  [97.8 F (36.6 C)-98.6 F (37 C)] 98.4 F (36.9 C) (08/15 0718) Pulse Rate:  [41-76] 65 (08/15 0718) Resp:  [16-18] 16 (08/15 0718) BP: (103-149)/(51-78) 132/61 (08/15 0718) SpO2:  [78 %-100 %] 98 % (08/15 0718)  Intake/Output from previous day:  Intake/Output Summary (Last 24 hours) at 07/23/2024 0731 Last data filed at 07/22/2024 1917 Gross per 24 hour  Intake 972.67 ml  Output --  Net 972.67 ml    Intake/Output this shift: No intake/output data recorded.  Labs: Recent Labs    07/20/24 1346 07/21/24 0222 07/22/24 0618 07/22/24 1557 07/23/24 0538  HGB 10.1* 9.2* 7.4* 10.2* 9.1*   Recent Labs    07/22/24 1557 07/23/24 0538  WBC 10.0 7.0  RBC 3.48* 3.06*  HCT 31.1* 27.1*  PLT 152 124*   Recent Labs    07/22/24 1557 07/23/24 0538  NA 131* 137  K 4.1 4.8  CL 103 111  CO2 19* 21*  BUN 31* 29*  CREATININE 2.09* 1.80*  GLUCOSE 96 87  CALCIUM  8.6* 8.5*   Recent Labs    07/20/24 1346  INR 1.2     EXAM General - Patient is Alert and Oriented Extremity - Neurovascular intact Sensation intact distally Dorsiflexion/Plantar flexion intact Dressing/Incision - clean, dry, scant drainage Motor Function - intact, moving foot and toes well on exam.  Ambulated 250 feet with physical therapy.  Past Medical History:  Diagnosis Date   Hypertension    Thyroid  disease     Assessment/Plan: 2 Days Post-Op Procedure(s) (LRB): FIXATION, FRACTURE, INTERTROCHANTERIC, WITH INTRAMEDULLARY ROD (Left) Principal Problem:   Closed left hip fracture (HCC) Active  Problems:   Essential hypertension   Hypothyroidism   CKD stage 3b, GFR 30-44 ml/min (HCC)   PAF (paroxysmal atrial fibrillation) (HCC)   Chronic combined systolic and diastolic congestive heart failure (HCC)   S/P CABG x 4   CAD (coronary artery disease)   Malnutrition of moderate degree  Estimated body mass index is 18.88 kg/m as calculated from the following:   Height as of this encounter: 5' 4 (1.626 m).   Weight as of this encounter: 49.9 kg. Advance diet Up with therapy D/C IV fluids Discharge home with home health Dressing change before discharge to a new honeycomb dressing.  Discharge plan.  Follow-up at [facility] clinic orthopedics in 2 weeks for staple removal and x-rays of the left hip.  DVT Prophylaxis - Lovenox  and TED hose Weight-Bearing as tolerated to left leg  Krystal Doyne, PA-C Orthopaedic Surgery 07/23/2024, 7:31 AM

## 2024-07-29 DIAGNOSIS — Z933 Colostomy status: Secondary | ICD-10-CM | POA: Diagnosis not present

## 2024-08-01 DIAGNOSIS — I130 Hypertensive heart and chronic kidney disease with heart failure and stage 1 through stage 4 chronic kidney disease, or unspecified chronic kidney disease: Secondary | ICD-10-CM | POA: Diagnosis not present

## 2024-08-01 DIAGNOSIS — N1832 Chronic kidney disease, stage 3b: Secondary | ICD-10-CM | POA: Diagnosis not present

## 2024-08-01 DIAGNOSIS — E039 Hypothyroidism, unspecified: Secondary | ICD-10-CM | POA: Diagnosis not present

## 2024-08-01 DIAGNOSIS — S72145D Nondisplaced intertrochanteric fracture of left femur, subsequent encounter for closed fracture with routine healing: Secondary | ICD-10-CM | POA: Diagnosis not present

## 2024-08-01 DIAGNOSIS — I25810 Atherosclerosis of coronary artery bypass graft(s) without angina pectoris: Secondary | ICD-10-CM | POA: Diagnosis not present

## 2024-08-01 DIAGNOSIS — D631 Anemia in chronic kidney disease: Secondary | ICD-10-CM | POA: Diagnosis not present

## 2024-08-01 DIAGNOSIS — E440 Moderate protein-calorie malnutrition: Secondary | ICD-10-CM | POA: Diagnosis not present

## 2024-08-01 DIAGNOSIS — I480 Paroxysmal atrial fibrillation: Secondary | ICD-10-CM | POA: Diagnosis not present

## 2024-08-01 DIAGNOSIS — I5042 Chronic combined systolic (congestive) and diastolic (congestive) heart failure: Secondary | ICD-10-CM | POA: Diagnosis not present

## 2024-08-05 DIAGNOSIS — M25552 Pain in left hip: Secondary | ICD-10-CM | POA: Diagnosis not present

## 2024-08-06 DIAGNOSIS — E039 Hypothyroidism, unspecified: Secondary | ICD-10-CM | POA: Diagnosis not present

## 2024-08-06 DIAGNOSIS — Z951 Presence of aortocoronary bypass graft: Secondary | ICD-10-CM | POA: Diagnosis not present

## 2024-08-06 DIAGNOSIS — I5042 Chronic combined systolic (congestive) and diastolic (congestive) heart failure: Secondary | ICD-10-CM | POA: Diagnosis not present

## 2024-08-06 DIAGNOSIS — I130 Hypertensive heart and chronic kidney disease with heart failure and stage 1 through stage 4 chronic kidney disease, or unspecified chronic kidney disease: Secondary | ICD-10-CM | POA: Diagnosis not present

## 2024-08-06 DIAGNOSIS — M5489 Other dorsalgia: Secondary | ICD-10-CM | POA: Diagnosis not present

## 2024-08-06 DIAGNOSIS — I480 Paroxysmal atrial fibrillation: Secondary | ICD-10-CM | POA: Diagnosis not present

## 2024-08-06 DIAGNOSIS — S72145D Nondisplaced intertrochanteric fracture of left femur, subsequent encounter for closed fracture with routine healing: Secondary | ICD-10-CM | POA: Diagnosis not present

## 2024-08-06 DIAGNOSIS — M549 Dorsalgia, unspecified: Secondary | ICD-10-CM | POA: Diagnosis not present

## 2024-08-06 DIAGNOSIS — D631 Anemia in chronic kidney disease: Secondary | ICD-10-CM | POA: Diagnosis not present

## 2024-08-06 DIAGNOSIS — Z933 Colostomy status: Secondary | ICD-10-CM | POA: Diagnosis not present

## 2024-08-06 DIAGNOSIS — Z09 Encounter for follow-up examination after completed treatment for conditions other than malignant neoplasm: Secondary | ICD-10-CM | POA: Diagnosis not present

## 2024-08-06 DIAGNOSIS — I25810 Atherosclerosis of coronary artery bypass graft(s) without angina pectoris: Secondary | ICD-10-CM | POA: Diagnosis not present

## 2024-08-06 DIAGNOSIS — N1832 Chronic kidney disease, stage 3b: Secondary | ICD-10-CM | POA: Diagnosis not present

## 2024-08-06 DIAGNOSIS — E440 Moderate protein-calorie malnutrition: Secondary | ICD-10-CM | POA: Diagnosis not present

## 2024-08-06 DIAGNOSIS — Z8781 Personal history of (healed) traumatic fracture: Secondary | ICD-10-CM | POA: Diagnosis not present

## 2024-08-10 ENCOUNTER — Other Ambulatory Visit: Payer: Self-pay | Admitting: Physician Assistant

## 2024-08-10 DIAGNOSIS — I480 Paroxysmal atrial fibrillation: Secondary | ICD-10-CM | POA: Diagnosis not present

## 2024-08-10 DIAGNOSIS — S22070A Wedge compression fracture of T9-T10 vertebra, initial encounter for closed fracture: Secondary | ICD-10-CM

## 2024-08-10 DIAGNOSIS — E039 Hypothyroidism, unspecified: Secondary | ICD-10-CM | POA: Diagnosis not present

## 2024-08-10 DIAGNOSIS — I25810 Atherosclerosis of coronary artery bypass graft(s) without angina pectoris: Secondary | ICD-10-CM | POA: Diagnosis not present

## 2024-08-10 DIAGNOSIS — N1832 Chronic kidney disease, stage 3b: Secondary | ICD-10-CM | POA: Diagnosis not present

## 2024-08-10 DIAGNOSIS — D631 Anemia in chronic kidney disease: Secondary | ICD-10-CM | POA: Diagnosis not present

## 2024-08-10 DIAGNOSIS — I5042 Chronic combined systolic (congestive) and diastolic (congestive) heart failure: Secondary | ICD-10-CM | POA: Diagnosis not present

## 2024-08-10 DIAGNOSIS — I130 Hypertensive heart and chronic kidney disease with heart failure and stage 1 through stage 4 chronic kidney disease, or unspecified chronic kidney disease: Secondary | ICD-10-CM | POA: Diagnosis not present

## 2024-08-10 DIAGNOSIS — S72145D Nondisplaced intertrochanteric fracture of left femur, subsequent encounter for closed fracture with routine healing: Secondary | ICD-10-CM | POA: Diagnosis not present

## 2024-08-10 DIAGNOSIS — E440 Moderate protein-calorie malnutrition: Secondary | ICD-10-CM | POA: Diagnosis not present

## 2024-08-11 DIAGNOSIS — N1832 Chronic kidney disease, stage 3b: Secondary | ICD-10-CM | POA: Diagnosis not present

## 2024-08-11 DIAGNOSIS — I480 Paroxysmal atrial fibrillation: Secondary | ICD-10-CM | POA: Diagnosis not present

## 2024-08-11 DIAGNOSIS — S72145D Nondisplaced intertrochanteric fracture of left femur, subsequent encounter for closed fracture with routine healing: Secondary | ICD-10-CM | POA: Diagnosis not present

## 2024-08-11 DIAGNOSIS — D631 Anemia in chronic kidney disease: Secondary | ICD-10-CM | POA: Diagnosis not present

## 2024-08-11 DIAGNOSIS — I130 Hypertensive heart and chronic kidney disease with heart failure and stage 1 through stage 4 chronic kidney disease, or unspecified chronic kidney disease: Secondary | ICD-10-CM | POA: Diagnosis not present

## 2024-08-11 DIAGNOSIS — I25810 Atherosclerosis of coronary artery bypass graft(s) without angina pectoris: Secondary | ICD-10-CM | POA: Diagnosis not present

## 2024-08-11 DIAGNOSIS — E440 Moderate protein-calorie malnutrition: Secondary | ICD-10-CM | POA: Diagnosis not present

## 2024-08-11 DIAGNOSIS — I5042 Chronic combined systolic (congestive) and diastolic (congestive) heart failure: Secondary | ICD-10-CM | POA: Diagnosis not present

## 2024-08-11 DIAGNOSIS — E039 Hypothyroidism, unspecified: Secondary | ICD-10-CM | POA: Diagnosis not present

## 2024-08-12 DIAGNOSIS — S72145D Nondisplaced intertrochanteric fracture of left femur, subsequent encounter for closed fracture with routine healing: Secondary | ICD-10-CM | POA: Diagnosis not present

## 2024-08-12 DIAGNOSIS — E440 Moderate protein-calorie malnutrition: Secondary | ICD-10-CM | POA: Diagnosis not present

## 2024-08-12 DIAGNOSIS — I25810 Atherosclerosis of coronary artery bypass graft(s) without angina pectoris: Secondary | ICD-10-CM | POA: Diagnosis not present

## 2024-08-12 DIAGNOSIS — E039 Hypothyroidism, unspecified: Secondary | ICD-10-CM | POA: Diagnosis not present

## 2024-08-12 DIAGNOSIS — N1832 Chronic kidney disease, stage 3b: Secondary | ICD-10-CM | POA: Diagnosis not present

## 2024-08-12 DIAGNOSIS — D631 Anemia in chronic kidney disease: Secondary | ICD-10-CM | POA: Diagnosis not present

## 2024-08-12 DIAGNOSIS — I130 Hypertensive heart and chronic kidney disease with heart failure and stage 1 through stage 4 chronic kidney disease, or unspecified chronic kidney disease: Secondary | ICD-10-CM | POA: Diagnosis not present

## 2024-08-12 DIAGNOSIS — I480 Paroxysmal atrial fibrillation: Secondary | ICD-10-CM | POA: Diagnosis not present

## 2024-08-12 DIAGNOSIS — I5042 Chronic combined systolic (congestive) and diastolic (congestive) heart failure: Secondary | ICD-10-CM | POA: Diagnosis not present

## 2024-08-17 ENCOUNTER — Encounter: Payer: Self-pay | Admitting: Orthopedic Surgery

## 2024-08-17 DIAGNOSIS — E440 Moderate protein-calorie malnutrition: Secondary | ICD-10-CM | POA: Diagnosis not present

## 2024-08-17 DIAGNOSIS — D631 Anemia in chronic kidney disease: Secondary | ICD-10-CM | POA: Diagnosis not present

## 2024-08-17 DIAGNOSIS — I480 Paroxysmal atrial fibrillation: Secondary | ICD-10-CM | POA: Diagnosis not present

## 2024-08-17 DIAGNOSIS — I130 Hypertensive heart and chronic kidney disease with heart failure and stage 1 through stage 4 chronic kidney disease, or unspecified chronic kidney disease: Secondary | ICD-10-CM | POA: Diagnosis not present

## 2024-08-17 DIAGNOSIS — S72145D Nondisplaced intertrochanteric fracture of left femur, subsequent encounter for closed fracture with routine healing: Secondary | ICD-10-CM | POA: Diagnosis not present

## 2024-08-17 DIAGNOSIS — I25810 Atherosclerosis of coronary artery bypass graft(s) without angina pectoris: Secondary | ICD-10-CM | POA: Diagnosis not present

## 2024-08-17 DIAGNOSIS — N1832 Chronic kidney disease, stage 3b: Secondary | ICD-10-CM | POA: Diagnosis not present

## 2024-08-17 DIAGNOSIS — I5042 Chronic combined systolic (congestive) and diastolic (congestive) heart failure: Secondary | ICD-10-CM | POA: Diagnosis not present

## 2024-08-17 DIAGNOSIS — E039 Hypothyroidism, unspecified: Secondary | ICD-10-CM | POA: Diagnosis not present

## 2024-08-19 DIAGNOSIS — D631 Anemia in chronic kidney disease: Secondary | ICD-10-CM | POA: Diagnosis not present

## 2024-08-19 DIAGNOSIS — I130 Hypertensive heart and chronic kidney disease with heart failure and stage 1 through stage 4 chronic kidney disease, or unspecified chronic kidney disease: Secondary | ICD-10-CM | POA: Diagnosis not present

## 2024-08-19 DIAGNOSIS — E039 Hypothyroidism, unspecified: Secondary | ICD-10-CM | POA: Diagnosis not present

## 2024-08-19 DIAGNOSIS — E440 Moderate protein-calorie malnutrition: Secondary | ICD-10-CM | POA: Diagnosis not present

## 2024-08-19 DIAGNOSIS — I5042 Chronic combined systolic (congestive) and diastolic (congestive) heart failure: Secondary | ICD-10-CM | POA: Diagnosis not present

## 2024-08-19 DIAGNOSIS — S72145D Nondisplaced intertrochanteric fracture of left femur, subsequent encounter for closed fracture with routine healing: Secondary | ICD-10-CM | POA: Diagnosis not present

## 2024-08-19 DIAGNOSIS — I25810 Atherosclerosis of coronary artery bypass graft(s) without angina pectoris: Secondary | ICD-10-CM | POA: Diagnosis not present

## 2024-08-19 DIAGNOSIS — I480 Paroxysmal atrial fibrillation: Secondary | ICD-10-CM | POA: Diagnosis not present

## 2024-08-19 DIAGNOSIS — N1832 Chronic kidney disease, stage 3b: Secondary | ICD-10-CM | POA: Diagnosis not present

## 2024-08-20 DIAGNOSIS — E039 Hypothyroidism, unspecified: Secondary | ICD-10-CM | POA: Diagnosis not present

## 2024-08-20 DIAGNOSIS — I5042 Chronic combined systolic (congestive) and diastolic (congestive) heart failure: Secondary | ICD-10-CM | POA: Diagnosis not present

## 2024-08-20 DIAGNOSIS — D631 Anemia in chronic kidney disease: Secondary | ICD-10-CM | POA: Diagnosis not present

## 2024-08-20 DIAGNOSIS — N1832 Chronic kidney disease, stage 3b: Secondary | ICD-10-CM | POA: Diagnosis not present

## 2024-08-20 DIAGNOSIS — S72145D Nondisplaced intertrochanteric fracture of left femur, subsequent encounter for closed fracture with routine healing: Secondary | ICD-10-CM | POA: Diagnosis not present

## 2024-08-20 DIAGNOSIS — I480 Paroxysmal atrial fibrillation: Secondary | ICD-10-CM | POA: Diagnosis not present

## 2024-08-20 DIAGNOSIS — I25810 Atherosclerosis of coronary artery bypass graft(s) without angina pectoris: Secondary | ICD-10-CM | POA: Diagnosis not present

## 2024-08-20 DIAGNOSIS — E440 Moderate protein-calorie malnutrition: Secondary | ICD-10-CM | POA: Diagnosis not present

## 2024-08-20 DIAGNOSIS — I130 Hypertensive heart and chronic kidney disease with heart failure and stage 1 through stage 4 chronic kidney disease, or unspecified chronic kidney disease: Secondary | ICD-10-CM | POA: Diagnosis not present

## 2024-08-24 DIAGNOSIS — E039 Hypothyroidism, unspecified: Secondary | ICD-10-CM | POA: Diagnosis not present

## 2024-08-24 DIAGNOSIS — E440 Moderate protein-calorie malnutrition: Secondary | ICD-10-CM | POA: Diagnosis not present

## 2024-08-24 DIAGNOSIS — D631 Anemia in chronic kidney disease: Secondary | ICD-10-CM | POA: Diagnosis not present

## 2024-08-24 DIAGNOSIS — N1832 Chronic kidney disease, stage 3b: Secondary | ICD-10-CM | POA: Diagnosis not present

## 2024-08-24 DIAGNOSIS — S72145D Nondisplaced intertrochanteric fracture of left femur, subsequent encounter for closed fracture with routine healing: Secondary | ICD-10-CM | POA: Diagnosis not present

## 2024-08-24 DIAGNOSIS — I5042 Chronic combined systolic (congestive) and diastolic (congestive) heart failure: Secondary | ICD-10-CM | POA: Diagnosis not present

## 2024-08-24 DIAGNOSIS — I130 Hypertensive heart and chronic kidney disease with heart failure and stage 1 through stage 4 chronic kidney disease, or unspecified chronic kidney disease: Secondary | ICD-10-CM | POA: Diagnosis not present

## 2024-08-24 DIAGNOSIS — I480 Paroxysmal atrial fibrillation: Secondary | ICD-10-CM | POA: Diagnosis not present

## 2024-08-24 DIAGNOSIS — I25810 Atherosclerosis of coronary artery bypass graft(s) without angina pectoris: Secondary | ICD-10-CM | POA: Diagnosis not present

## 2024-08-25 DIAGNOSIS — I25810 Atherosclerosis of coronary artery bypass graft(s) without angina pectoris: Secondary | ICD-10-CM | POA: Diagnosis not present

## 2024-08-25 DIAGNOSIS — E039 Hypothyroidism, unspecified: Secondary | ICD-10-CM | POA: Diagnosis not present

## 2024-08-25 DIAGNOSIS — I130 Hypertensive heart and chronic kidney disease with heart failure and stage 1 through stage 4 chronic kidney disease, or unspecified chronic kidney disease: Secondary | ICD-10-CM | POA: Diagnosis not present

## 2024-08-25 DIAGNOSIS — D631 Anemia in chronic kidney disease: Secondary | ICD-10-CM | POA: Diagnosis not present

## 2024-08-25 DIAGNOSIS — S72145D Nondisplaced intertrochanteric fracture of left femur, subsequent encounter for closed fracture with routine healing: Secondary | ICD-10-CM | POA: Diagnosis not present

## 2024-08-25 DIAGNOSIS — I480 Paroxysmal atrial fibrillation: Secondary | ICD-10-CM | POA: Diagnosis not present

## 2024-08-25 DIAGNOSIS — I5042 Chronic combined systolic (congestive) and diastolic (congestive) heart failure: Secondary | ICD-10-CM | POA: Diagnosis not present

## 2024-08-25 DIAGNOSIS — E440 Moderate protein-calorie malnutrition: Secondary | ICD-10-CM | POA: Diagnosis not present

## 2024-08-25 DIAGNOSIS — N1832 Chronic kidney disease, stage 3b: Secondary | ICD-10-CM | POA: Diagnosis not present

## 2024-08-30 DIAGNOSIS — Z933 Colostomy status: Secondary | ICD-10-CM | POA: Diagnosis not present

## 2024-08-31 DIAGNOSIS — N1832 Chronic kidney disease, stage 3b: Secondary | ICD-10-CM | POA: Diagnosis not present

## 2024-08-31 DIAGNOSIS — S72145D Nondisplaced intertrochanteric fracture of left femur, subsequent encounter for closed fracture with routine healing: Secondary | ICD-10-CM | POA: Diagnosis not present

## 2024-08-31 DIAGNOSIS — D631 Anemia in chronic kidney disease: Secondary | ICD-10-CM | POA: Diagnosis not present

## 2024-08-31 DIAGNOSIS — E440 Moderate protein-calorie malnutrition: Secondary | ICD-10-CM | POA: Diagnosis not present

## 2024-08-31 DIAGNOSIS — I25810 Atherosclerosis of coronary artery bypass graft(s) without angina pectoris: Secondary | ICD-10-CM | POA: Diagnosis not present

## 2024-08-31 DIAGNOSIS — I130 Hypertensive heart and chronic kidney disease with heart failure and stage 1 through stage 4 chronic kidney disease, or unspecified chronic kidney disease: Secondary | ICD-10-CM | POA: Diagnosis not present

## 2024-08-31 DIAGNOSIS — I5042 Chronic combined systolic (congestive) and diastolic (congestive) heart failure: Secondary | ICD-10-CM | POA: Diagnosis not present

## 2024-08-31 DIAGNOSIS — I480 Paroxysmal atrial fibrillation: Secondary | ICD-10-CM | POA: Diagnosis not present

## 2024-08-31 DIAGNOSIS — E039 Hypothyroidism, unspecified: Secondary | ICD-10-CM | POA: Diagnosis not present

## 2024-09-02 DIAGNOSIS — N2581 Secondary hyperparathyroidism of renal origin: Secondary | ICD-10-CM | POA: Diagnosis not present

## 2024-09-02 DIAGNOSIS — D631 Anemia in chronic kidney disease: Secondary | ICD-10-CM | POA: Diagnosis not present

## 2024-09-02 DIAGNOSIS — Z9889 Other specified postprocedural states: Secondary | ICD-10-CM | POA: Diagnosis not present

## 2024-09-02 DIAGNOSIS — N1832 Chronic kidney disease, stage 3b: Secondary | ICD-10-CM | POA: Diagnosis not present

## 2024-09-02 DIAGNOSIS — E8722 Chronic metabolic acidosis: Secondary | ICD-10-CM | POA: Diagnosis not present

## 2024-09-02 DIAGNOSIS — Z8781 Personal history of (healed) traumatic fracture: Secondary | ICD-10-CM | POA: Diagnosis not present

## 2024-09-02 DIAGNOSIS — I10 Essential (primary) hypertension: Secondary | ICD-10-CM | POA: Diagnosis not present

## 2024-09-02 DIAGNOSIS — R300 Dysuria: Secondary | ICD-10-CM | POA: Diagnosis not present

## 2024-09-06 DIAGNOSIS — Z933 Colostomy status: Secondary | ICD-10-CM | POA: Diagnosis not present

## 2024-09-08 DIAGNOSIS — E039 Hypothyroidism, unspecified: Secondary | ICD-10-CM | POA: Diagnosis not present

## 2024-09-08 DIAGNOSIS — E440 Moderate protein-calorie malnutrition: Secondary | ICD-10-CM | POA: Diagnosis not present

## 2024-09-08 DIAGNOSIS — I480 Paroxysmal atrial fibrillation: Secondary | ICD-10-CM | POA: Diagnosis not present

## 2024-09-08 DIAGNOSIS — N1832 Chronic kidney disease, stage 3b: Secondary | ICD-10-CM | POA: Diagnosis not present

## 2024-09-08 DIAGNOSIS — S72145D Nondisplaced intertrochanteric fracture of left femur, subsequent encounter for closed fracture with routine healing: Secondary | ICD-10-CM | POA: Diagnosis not present

## 2024-09-08 DIAGNOSIS — I25810 Atherosclerosis of coronary artery bypass graft(s) without angina pectoris: Secondary | ICD-10-CM | POA: Diagnosis not present

## 2024-09-08 DIAGNOSIS — I130 Hypertensive heart and chronic kidney disease with heart failure and stage 1 through stage 4 chronic kidney disease, or unspecified chronic kidney disease: Secondary | ICD-10-CM | POA: Diagnosis not present

## 2024-09-08 DIAGNOSIS — I5042 Chronic combined systolic (congestive) and diastolic (congestive) heart failure: Secondary | ICD-10-CM | POA: Diagnosis not present

## 2024-09-08 DIAGNOSIS — D631 Anemia in chronic kidney disease: Secondary | ICD-10-CM | POA: Diagnosis not present

## 2024-09-13 DIAGNOSIS — E8722 Chronic metabolic acidosis: Secondary | ICD-10-CM | POA: Diagnosis not present

## 2024-09-13 DIAGNOSIS — S72145D Nondisplaced intertrochanteric fracture of left femur, subsequent encounter for closed fracture with routine healing: Secondary | ICD-10-CM | POA: Diagnosis not present

## 2024-09-13 DIAGNOSIS — E440 Moderate protein-calorie malnutrition: Secondary | ICD-10-CM | POA: Diagnosis not present

## 2024-09-13 DIAGNOSIS — N1832 Chronic kidney disease, stage 3b: Secondary | ICD-10-CM | POA: Diagnosis not present

## 2024-09-13 DIAGNOSIS — E039 Hypothyroidism, unspecified: Secondary | ICD-10-CM | POA: Diagnosis not present

## 2024-09-13 DIAGNOSIS — I130 Hypertensive heart and chronic kidney disease with heart failure and stage 1 through stage 4 chronic kidney disease, or unspecified chronic kidney disease: Secondary | ICD-10-CM | POA: Diagnosis not present

## 2024-09-13 DIAGNOSIS — I480 Paroxysmal atrial fibrillation: Secondary | ICD-10-CM | POA: Diagnosis not present

## 2024-09-13 DIAGNOSIS — I10 Essential (primary) hypertension: Secondary | ICD-10-CM | POA: Diagnosis not present

## 2024-09-13 DIAGNOSIS — I5042 Chronic combined systolic (congestive) and diastolic (congestive) heart failure: Secondary | ICD-10-CM | POA: Diagnosis not present

## 2024-09-13 DIAGNOSIS — D631 Anemia in chronic kidney disease: Secondary | ICD-10-CM | POA: Diagnosis not present

## 2024-09-13 DIAGNOSIS — N2581 Secondary hyperparathyroidism of renal origin: Secondary | ICD-10-CM | POA: Diagnosis not present

## 2024-09-13 DIAGNOSIS — I25810 Atherosclerosis of coronary artery bypass graft(s) without angina pectoris: Secondary | ICD-10-CM | POA: Diagnosis not present

## 2024-09-23 DIAGNOSIS — M810 Age-related osteoporosis without current pathological fracture: Secondary | ICD-10-CM | POA: Diagnosis not present

## 2024-09-23 DIAGNOSIS — Z951 Presence of aortocoronary bypass graft: Secondary | ICD-10-CM | POA: Diagnosis not present

## 2024-09-23 DIAGNOSIS — I5022 Chronic systolic (congestive) heart failure: Secondary | ICD-10-CM | POA: Diagnosis not present

## 2024-09-23 DIAGNOSIS — I25810 Atherosclerosis of coronary artery bypass graft(s) without angina pectoris: Secondary | ICD-10-CM | POA: Diagnosis not present

## 2024-09-23 DIAGNOSIS — R7303 Prediabetes: Secondary | ICD-10-CM | POA: Diagnosis not present

## 2024-09-23 DIAGNOSIS — I480 Paroxysmal atrial fibrillation: Secondary | ICD-10-CM | POA: Diagnosis not present

## 2024-09-23 DIAGNOSIS — E782 Mixed hyperlipidemia: Secondary | ICD-10-CM | POA: Diagnosis not present

## 2024-09-23 DIAGNOSIS — I10 Essential (primary) hypertension: Secondary | ICD-10-CM | POA: Diagnosis not present

## 2024-10-07 DIAGNOSIS — I10 Essential (primary) hypertension: Secondary | ICD-10-CM | POA: Diagnosis not present

## 2024-10-07 DIAGNOSIS — I480 Paroxysmal atrial fibrillation: Secondary | ICD-10-CM | POA: Diagnosis not present

## 2024-10-07 DIAGNOSIS — Z1331 Encounter for screening for depression: Secondary | ICD-10-CM | POA: Diagnosis not present

## 2024-10-07 DIAGNOSIS — E782 Mixed hyperlipidemia: Secondary | ICD-10-CM | POA: Diagnosis not present

## 2024-10-07 DIAGNOSIS — Z0000 Encounter for general adult medical examination without abnormal findings: Secondary | ICD-10-CM | POA: Diagnosis not present

## 2024-10-07 DIAGNOSIS — Z951 Presence of aortocoronary bypass graft: Secondary | ICD-10-CM | POA: Diagnosis not present

## 2024-10-07 DIAGNOSIS — R7303 Prediabetes: Secondary | ICD-10-CM | POA: Diagnosis not present

## 2024-10-07 DIAGNOSIS — I5042 Chronic combined systolic (congestive) and diastolic (congestive) heart failure: Secondary | ICD-10-CM | POA: Diagnosis not present

## 2024-10-07 DIAGNOSIS — F32A Depression, unspecified: Secondary | ICD-10-CM | POA: Diagnosis not present

## 2024-10-08 DIAGNOSIS — Z933 Colostomy status: Secondary | ICD-10-CM | POA: Diagnosis not present

## 2024-11-10 DIAGNOSIS — Z933 Colostomy status: Secondary | ICD-10-CM | POA: Diagnosis not present
